# Patient Record
Sex: Female | Born: 1935 | Race: White | Hispanic: No | Marital: Married | Smoking: Former smoker
Health system: Southern US, Community
[De-identification: ages and names within clinical notes are randomized; demographics above are authoritative.]

## PROBLEM LIST (undated history)

## (undated) DIAGNOSIS — R519 Headache, unspecified: Secondary | ICD-10-CM

## (undated) DIAGNOSIS — K649 Unspecified hemorrhoids: Secondary | ICD-10-CM

## (undated) DIAGNOSIS — I10 Essential (primary) hypertension: Secondary | ICD-10-CM

## (undated) DIAGNOSIS — R918 Other nonspecific abnormal finding of lung field: Secondary | ICD-10-CM

## (undated) DIAGNOSIS — K5790 Diverticulosis of intestine, part unspecified, without perforation or abscess without bleeding: Secondary | ICD-10-CM

## (undated) DIAGNOSIS — E785 Hyperlipidemia, unspecified: Secondary | ICD-10-CM

## (undated) DIAGNOSIS — R51 Headache: Secondary | ICD-10-CM

## (undated) DIAGNOSIS — C439 Malignant melanoma of skin, unspecified: Secondary | ICD-10-CM

## (undated) DIAGNOSIS — Z972 Presence of dental prosthetic device (complete) (partial): Secondary | ICD-10-CM

## (undated) DIAGNOSIS — D126 Benign neoplasm of colon, unspecified: Secondary | ICD-10-CM

## (undated) DIAGNOSIS — E039 Hypothyroidism, unspecified: Secondary | ICD-10-CM

## (undated) HISTORY — DX: Hyperlipidemia, unspecified: E78.5

## (undated) HISTORY — DX: Essential (primary) hypertension: I10

## (undated) HISTORY — DX: Malignant melanoma of skin, unspecified: C43.9

## (undated) HISTORY — PX: VAGINAL HYSTERECTOMY: SHX2639

## (undated) HISTORY — PX: OTHER SURGICAL HISTORY: SHX169

## (undated) HISTORY — DX: Unspecified hemorrhoids: K64.9

## (undated) HISTORY — PX: MULTIPLE TOOTH EXTRACTIONS: SHX2053

## (undated) HISTORY — PX: KNEE SURGERY: SHX244

## (undated) HISTORY — DX: Hypothyroidism, unspecified: E03.9

## (undated) HISTORY — DX: Diverticulosis of intestine, part unspecified, without perforation or abscess without bleeding: K57.90

## (undated) HISTORY — PX: SKIN CANCER EXCISION: SHX779

## (undated) HISTORY — DX: Benign neoplasm of colon, unspecified: D12.6

---
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
## 1991-01-29 DIAGNOSIS — E785 Hyperlipidemia, unspecified: Secondary | ICD-10-CM

## 1991-01-29 HISTORY — DX: Hyperlipidemia, unspecified: E78.5

## 1995-10-29 DIAGNOSIS — I10 Essential (primary) hypertension: Secondary | ICD-10-CM

## 1995-10-29 DIAGNOSIS — E039 Hypothyroidism, unspecified: Secondary | ICD-10-CM

## 1995-10-29 HISTORY — DX: Hypothyroidism, unspecified: E03.9

## 1995-10-29 HISTORY — DX: Essential (primary) hypertension: I10

## 1998-08-29 ENCOUNTER — Encounter: Payer: Self-pay | Admitting: Family Medicine

## 1998-08-29 LAB — CONVERTED CEMR LAB: Pap Smear: NORMAL

## 1998-09-09 ENCOUNTER — Other Ambulatory Visit: Admission: RE | Admit: 1998-09-09 | Discharge: 1998-09-09 | Payer: Self-pay | Admitting: Family Medicine

## 1999-09-30 ENCOUNTER — Encounter: Admission: RE | Admit: 1999-09-30 | Discharge: 1999-09-30 | Payer: Self-pay | Admitting: Internal Medicine

## 1999-09-30 ENCOUNTER — Encounter: Payer: Self-pay | Admitting: Internal Medicine

## 1999-10-07 ENCOUNTER — Encounter: Admission: RE | Admit: 1999-10-07 | Discharge: 1999-10-07 | Payer: Self-pay | Admitting: Internal Medicine

## 1999-10-07 ENCOUNTER — Encounter: Payer: Self-pay | Admitting: Internal Medicine

## 2000-03-30 LAB — HM DEXA SCAN: HM Dexa Scan: NORMAL

## 2000-10-01 ENCOUNTER — Encounter: Payer: Self-pay | Admitting: Internal Medicine

## 2000-10-01 ENCOUNTER — Encounter: Admission: RE | Admit: 2000-10-01 | Discharge: 2000-10-01 | Payer: Self-pay | Admitting: Internal Medicine

## 2000-10-20 ENCOUNTER — Encounter: Payer: Self-pay | Admitting: Family Medicine

## 2001-03-04 ENCOUNTER — Encounter: Payer: Self-pay | Admitting: Family Medicine

## 2001-03-04 ENCOUNTER — Encounter: Payer: Self-pay | Admitting: Surgery

## 2001-03-04 ENCOUNTER — Ambulatory Visit: Admission: RE | Admit: 2001-03-04 | Discharge: 2001-03-04 | Payer: Self-pay | Admitting: Surgery

## 2001-03-07 ENCOUNTER — Encounter: Payer: Self-pay | Admitting: Family Medicine

## 2001-04-25 ENCOUNTER — Observation Stay (HOSPITAL_COMMUNITY): Admission: RE | Admit: 2001-04-25 | Discharge: 2001-04-26 | Payer: Self-pay | Admitting: Surgery

## 2001-04-25 HISTORY — PX: HERNIA REPAIR: SHX51

## 2001-10-12 ENCOUNTER — Encounter: Admission: RE | Admit: 2001-10-12 | Discharge: 2001-10-12 | Payer: Self-pay | Admitting: Internal Medicine

## 2001-10-12 ENCOUNTER — Encounter: Payer: Self-pay | Admitting: Internal Medicine

## 2002-01-04 ENCOUNTER — Ambulatory Visit (HOSPITAL_BASED_OUTPATIENT_CLINIC_OR_DEPARTMENT_OTHER): Admission: RE | Admit: 2002-01-04 | Discharge: 2002-01-04 | Payer: Self-pay | Admitting: Surgery

## 2002-02-27 LAB — FECAL OCCULT BLOOD, GUAIAC: Fecal Occult Blood: NEGATIVE

## 2002-10-31 ENCOUNTER — Encounter: Admission: RE | Admit: 2002-10-31 | Discharge: 2002-10-31 | Payer: Self-pay | Admitting: Internal Medicine

## 2002-10-31 ENCOUNTER — Encounter: Payer: Self-pay | Admitting: Internal Medicine

## 2003-11-01 ENCOUNTER — Encounter: Admission: RE | Admit: 2003-11-01 | Discharge: 2003-11-01 | Payer: Self-pay | Admitting: Internal Medicine

## 2004-03-25 ENCOUNTER — Ambulatory Visit: Payer: Self-pay | Admitting: Internal Medicine

## 2004-03-30 HISTORY — PX: SEPTOPLASTY: SUR1290

## 2004-10-08 ENCOUNTER — Ambulatory Visit: Payer: Self-pay | Admitting: Gastroenterology

## 2004-10-22 ENCOUNTER — Ambulatory Visit: Payer: Self-pay | Admitting: Gastroenterology

## 2004-11-03 ENCOUNTER — Encounter: Admission: RE | Admit: 2004-11-03 | Discharge: 2004-11-03 | Payer: Self-pay | Admitting: Internal Medicine

## 2004-11-10 ENCOUNTER — Ambulatory Visit: Payer: Self-pay | Admitting: Family Medicine

## 2004-12-10 ENCOUNTER — Ambulatory Visit: Payer: Self-pay | Admitting: Internal Medicine

## 2004-12-31 ENCOUNTER — Ambulatory Visit: Payer: Self-pay | Admitting: Internal Medicine

## 2005-01-13 ENCOUNTER — Ambulatory Visit: Payer: Self-pay | Admitting: Internal Medicine

## 2005-02-09 ENCOUNTER — Ambulatory Visit: Payer: Self-pay | Admitting: Internal Medicine

## 2005-06-03 ENCOUNTER — Ambulatory Visit: Payer: Self-pay | Admitting: Internal Medicine

## 2005-07-23 ENCOUNTER — Encounter: Admission: RE | Admit: 2005-07-23 | Discharge: 2005-07-23 | Payer: Self-pay | Admitting: Otolaryngology

## 2005-07-27 ENCOUNTER — Ambulatory Visit (HOSPITAL_BASED_OUTPATIENT_CLINIC_OR_DEPARTMENT_OTHER): Admission: RE | Admit: 2005-07-27 | Discharge: 2005-07-27 | Payer: Self-pay | Admitting: Otolaryngology

## 2005-07-27 ENCOUNTER — Encounter (INDEPENDENT_AMBULATORY_CARE_PROVIDER_SITE_OTHER): Payer: Self-pay | Admitting: *Deleted

## 2005-11-04 ENCOUNTER — Encounter: Admission: RE | Admit: 2005-11-04 | Discharge: 2005-11-04 | Payer: Self-pay | Admitting: Internal Medicine

## 2005-11-13 ENCOUNTER — Encounter: Admission: RE | Admit: 2005-11-13 | Discharge: 2005-11-13 | Payer: Self-pay | Admitting: Internal Medicine

## 2005-12-23 ENCOUNTER — Ambulatory Visit: Payer: Self-pay | Admitting: Internal Medicine

## 2005-12-25 ENCOUNTER — Ambulatory Visit: Payer: Self-pay | Admitting: Family Medicine

## 2005-12-29 ENCOUNTER — Ambulatory Visit: Payer: Self-pay | Admitting: Family Medicine

## 2006-12-08 ENCOUNTER — Encounter: Payer: Self-pay | Admitting: Family Medicine

## 2006-12-08 ENCOUNTER — Encounter: Admission: RE | Admit: 2006-12-08 | Discharge: 2006-12-08 | Payer: Self-pay | Admitting: Family Medicine

## 2006-12-13 ENCOUNTER — Encounter (INDEPENDENT_AMBULATORY_CARE_PROVIDER_SITE_OTHER): Payer: Self-pay | Admitting: *Deleted

## 2006-12-15 ENCOUNTER — Encounter: Payer: Self-pay | Admitting: Family Medicine

## 2006-12-15 DIAGNOSIS — E669 Obesity, unspecified: Secondary | ICD-10-CM | POA: Insufficient documentation

## 2007-01-12 ENCOUNTER — Ambulatory Visit: Payer: Self-pay | Admitting: Family Medicine

## 2007-01-12 LAB — CONVERTED CEMR LAB
ALT: 21 units/L (ref 0–35)
AST: 23 units/L (ref 0–37)
BUN: 13 mg/dL (ref 6–23)
CO2: 30 meq/L (ref 19–32)
Calcium: 9.2 mg/dL (ref 8.4–10.5)
Chloride: 110 meq/L (ref 96–112)
Cholesterol: 149 mg/dL (ref 0–200)
Creatinine, Ser: 0.9 mg/dL (ref 0.4–1.2)
Free T4: 0.9 ng/dL (ref 0.6–1.6)
GFR calc Af Amer: 79 mL/min
GFR calc non Af Amer: 65 mL/min
Glucose, Bld: 90 mg/dL (ref 70–99)
HDL: 41.5 mg/dL (ref >39.0)
LDL Cholesterol: 86 mg/dL (ref 0–99)
Potassium: 3.7 meq/L (ref 3.5–5.1)
Sodium: 145 meq/L (ref 135–145)
TSH: 2.86 microintl units/mL (ref 0.35–5.50)
Total CHOL/HDL Ratio: 3.6
Triglycerides: 106 mg/dL (ref 0–149)
VLDL: 21 mg/dL (ref 0–40)

## 2007-01-17 ENCOUNTER — Ambulatory Visit: Payer: Self-pay | Admitting: Family Medicine

## 2007-01-19 ENCOUNTER — Telehealth: Payer: Self-pay | Admitting: Internal Medicine

## 2007-01-28 ENCOUNTER — Ambulatory Visit: Payer: Self-pay | Admitting: Family Medicine

## 2007-04-15 ENCOUNTER — Telehealth (INDEPENDENT_AMBULATORY_CARE_PROVIDER_SITE_OTHER): Payer: Self-pay | Admitting: *Deleted

## 2007-05-24 ENCOUNTER — Ambulatory Visit: Payer: Self-pay | Admitting: Family Medicine

## 2007-12-09 ENCOUNTER — Encounter: Admission: RE | Admit: 2007-12-09 | Discharge: 2007-12-09 | Payer: Self-pay | Admitting: Family Medicine

## 2007-12-13 ENCOUNTER — Encounter (INDEPENDENT_AMBULATORY_CARE_PROVIDER_SITE_OTHER): Payer: Self-pay | Admitting: *Deleted

## 2007-12-26 ENCOUNTER — Ambulatory Visit: Payer: Self-pay | Admitting: Family Medicine

## 2008-03-26 ENCOUNTER — Ambulatory Visit: Payer: Self-pay | Admitting: Family Medicine

## 2008-03-26 LAB — CONVERTED CEMR LAB
ALT: 27 units/L (ref 0–35)
AST: 31 units/L (ref 0–37)
Albumin: 3.8 g/dL (ref 3.5–5.2)
Alkaline Phosphatase: 57 units/L (ref 39–117)
BUN: 19 mg/dL (ref 6–23)
Bilirubin, Direct: 0.1 mg/dL (ref 0.0–0.3)
CO2: 31 meq/L (ref 19–32)
Calcium: 9.2 mg/dL (ref 8.4–10.5)
Chloride: 109 meq/L (ref 96–112)
Cholesterol: 165 mg/dL (ref 0–200)
Creatinine, Ser: 1.1 mg/dL (ref 0.4–1.2)
Free T4: 0.7 ng/dL (ref 0.6–1.6)
GFR calc Af Amer: 63 mL/min
GFR calc non Af Amer: 52 mL/min
Glucose, Bld: 87 mg/dL (ref 70–99)
HDL: 45.3 mg/dL (ref >39.0)
LDL Cholesterol: 96 mg/dL (ref 0–99)
Potassium: 3.7 meq/L (ref 3.5–5.1)
Sodium: 146 meq/L — ABNORMAL HIGH (ref 135–145)
TSH: 2.36 microintl units/mL (ref 0.35–5.50)
Total Bilirubin: 0.7 mg/dL (ref 0.3–1.2)
Total CHOL/HDL Ratio: 3.6
Total Protein: 6.7 g/dL (ref 6.0–8.3)
Triglycerides: 121 mg/dL (ref 0–149)
VLDL: 24 mg/dL (ref 0–40)

## 2008-04-04 ENCOUNTER — Ambulatory Visit: Payer: Self-pay | Admitting: Family Medicine

## 2008-04-13 ENCOUNTER — Telehealth: Payer: Self-pay | Admitting: Family Medicine

## 2008-04-30 ENCOUNTER — Telehealth: Payer: Self-pay | Admitting: Family Medicine

## 2008-12-12 ENCOUNTER — Encounter: Admission: RE | Admit: 2008-12-12 | Discharge: 2008-12-12 | Payer: Self-pay | Admitting: Family Medicine

## 2009-01-02 ENCOUNTER — Ambulatory Visit: Payer: Self-pay | Admitting: Family Medicine

## 2009-05-08 ENCOUNTER — Ambulatory Visit: Payer: Self-pay | Admitting: Family Medicine

## 2009-05-08 LAB — CONVERTED CEMR LAB
ALT: 21 units/L (ref 0–35)
AST: 24 units/L (ref 0–37)
Albumin: 4 g/dL (ref 3.5–5.2)
Alkaline Phosphatase: 58 units/L (ref 39–117)
BUN: 17 mg/dL (ref 6–23)
Bilirubin, Direct: 0.2 mg/dL (ref 0.0–0.3)
CO2: 29 meq/L (ref 19–32)
Calcium: 9.3 mg/dL (ref 8.4–10.5)
Chloride: 109 meq/L (ref 96–112)
Cholesterol: 148 mg/dL (ref 0–200)
Creatinine, Ser: 0.9 mg/dL (ref 0.4–1.2)
Free T4: 0.8 ng/dL (ref 0.6–1.6)
GFR calc non Af Amer: 64.91 mL/min (ref >60)
Glucose, Bld: 84 mg/dL (ref 70–99)
HDL: 46.5 mg/dL (ref >39.00)
LDL Cholesterol: 77 mg/dL (ref 0–99)
Potassium: 3.8 meq/L (ref 3.5–5.1)
Sodium: 143 meq/L (ref 135–145)
TSH: 2.58 microintl units/mL (ref 0.35–5.50)
Total Bilirubin: 0.7 mg/dL (ref 0.3–1.2)
Total CHOL/HDL Ratio: 3
Total Protein: 6.8 g/dL (ref 6.0–8.3)
Triglycerides: 125 mg/dL (ref 0.0–149.0)
VLDL: 25 mg/dL (ref 0.0–40.0)

## 2009-05-14 ENCOUNTER — Ambulatory Visit: Payer: Self-pay | Admitting: Family Medicine

## 2009-10-15 ENCOUNTER — Encounter (INDEPENDENT_AMBULATORY_CARE_PROVIDER_SITE_OTHER): Payer: Self-pay | Admitting: *Deleted

## 2009-10-24 ENCOUNTER — Encounter: Payer: Self-pay | Admitting: Gastroenterology

## 2009-11-04 ENCOUNTER — Encounter (INDEPENDENT_AMBULATORY_CARE_PROVIDER_SITE_OTHER): Payer: Self-pay | Admitting: *Deleted

## 2009-11-06 ENCOUNTER — Encounter (INDEPENDENT_AMBULATORY_CARE_PROVIDER_SITE_OTHER): Payer: Self-pay | Admitting: *Deleted

## 2009-11-20 ENCOUNTER — Encounter (INDEPENDENT_AMBULATORY_CARE_PROVIDER_SITE_OTHER): Payer: Self-pay | Admitting: *Deleted

## 2009-11-25 ENCOUNTER — Ambulatory Visit: Payer: Self-pay | Admitting: Gastroenterology

## 2009-12-09 ENCOUNTER — Ambulatory Visit: Payer: Self-pay | Admitting: Gastroenterology

## 2009-12-09 LAB — HM COLONOSCOPY

## 2009-12-11 ENCOUNTER — Encounter: Payer: Self-pay | Admitting: Gastroenterology

## 2009-12-18 ENCOUNTER — Encounter: Admission: RE | Admit: 2009-12-18 | Discharge: 2009-12-18 | Payer: Self-pay | Admitting: Family Medicine

## 2009-12-18 LAB — HM MAMMOGRAPHY: HM Mammogram: NEGATIVE

## 2009-12-19 ENCOUNTER — Ambulatory Visit: Payer: Self-pay | Admitting: Family Medicine

## 2010-04-10 ENCOUNTER — Ambulatory Visit
Admission: RE | Admit: 2010-04-10 | Discharge: 2010-04-10 | Payer: Self-pay | Source: Home / Self Care | Attending: Family Medicine | Admitting: Family Medicine

## 2010-04-17 ENCOUNTER — Telehealth: Payer: Self-pay | Admitting: Family Medicine

## 2010-04-29 NOTE — Miscellaneous (Signed)
Summary: gi medication  Clinical Lists Changes  Medications: Added new medication of ANALPRAM-HC SINGLES 1-1 %  CREA (HYDROCORTISONE ACE-PRAMOXINE) Apply to rectum 2 times as needed - Signed Rx of ANALPRAM-HC SINGLES 1-1 %  CREA (HYDROCORTISONE ACE-PRAMOXINE) Apply to rectum 2 times as needed;  #1 box x prn;  Signed;  Entered by: Trilby Leaver RN;  Authorized by: Sable Feil MD [facility];  Method [facility]: [facility]  [place], [place], [place]  [address], Ph: [phone] or [phone], Fax: [phone] Observations: Added new observation of ALLERGY REV: Done (12/09/2009 11:28) Added new observation of NKA: T (12/09/2009 11:28)    Prescriptions: ANALPRAM-HC SINGLES 1-1 %  CREA (HYDROCORTISONE ACE-PRAMOXINE) Apply to rectum 2 times as needed  #1 box x prn   Entered by:   Trilby Leaver RN   Authorized by:   Sable Feil MD [facility]   Signed by:   Trilby Leaver RN on 12/09/2009   Method used:   [facility]        [facility]  [place] (retail)       [address]       [place], [place]  [address]       Ph: [phone] or [phone]       Fax: [phone]   RxID:   [id number]

## 2010-04-29 NOTE — Assessment & Plan Note (Signed)
Summary: cpx/rbh   Vital Signs:  Patient profile:   77 year old female Height:      67 inches Weight:      216 pounds BMI:     33.95 Temp:     98.1 degrees F oral Pulse rate:   64 / minute Pulse rhythm:   regular BP sitting:   124 / 84  (left arm) Cuff size:   large  Vitals Entered By: Emelia Salisbury LPN (February 15, 624THL 10:55 AM) CC: 30 Minute checkup, has had a hysterectomy, had a colonoscopy in 2001 and thinks that she is due one this year, Preventive Care   History of Present Illness: Pt here for Comp Exam. She had mammo in Sep.  She has swelling of the legs, does eat salt and was discussed. She has taken Lasix for 20 years.  Preventive Screening-Counseling & Management  Alcohol-Tobacco     Alcohol drinks/day: 0     Smoking Status: quit     Year Quit: 1995     Pack years: 25     Passive Smoke Exposure: no  Caffeine-Diet-Exercise     Caffeine use/day: 0     Does Patient Exercise: yes     Type of exercise: swims once, aerobics once, square dance once     Times/week: 3  Problems Prior to Update: 1)  Postmenopausal Status, Iatrogenic  (ICD-627.2) 2)  Obesity  (ICD-278.00) 3)  Hyperlipidemia  (ICD-272.4) 4)  Hypertension  (ICD-401.9) 5)  Hypothyroidism  (ICD-244.9)  Medications Prior to Update: 1)  Levothyroxine Sodium 50 Mcg  Tabs (Levothyroxine Sodium) .... Take 1 Tablet By Mouth Once A Day 2)  Calcium 600/vitamin D 600-200 Mg-Unit  Tabs (Calcium Carbonate-Vitamin D) .... Take 1 Tablet By Mouth Two Times A Day 3)  Caduet 5-20 Mg  Tabs (Amlodipine-Atorvastatin) .... Take 1 Tablet By Mouth Once A Day 4)  Furosemide 20 Mg  Tabs (Furosemide) .... Take 1 Tablet By Mouth Once A Day 5)  Flonase 50 Mcg/act  Susp (Fluticasone Propionate) .... As Needed  Allergies: No Known Drug Allergies  Past History:  Past Medical History: Last updated: 12/15/2006 Hypothyroidism (10/29/1995) Hypertension (10/29/1995) Hyperlipidemia (01/29/1991) Dexa nml 2002  Past Surgical  History: Last updated: 01/17/2007 Tonsillectomy 1954 total hysterectomy fibroids 1984 colonoscopy 06/07/95 dexa nml 2002 hernia repair 04/25/01 Septoplasty and sinus surg  (Dr Ernesto Rutherford) 2006 Catarract repair bilat [id number]  Family History: Last updated: 01/17/2007 Father dec 70 Lungs (smoker) Mother dec 93 Stroke CHF Brother dec 62 Metastatic Ca  Sister dec 72 Stomach Ca Sister dec 78 ruptured Gallbladder  Social History: Last updated: 04/04/2008 Occupation:Admin Asst,[employer] Retired Married lives w/ husb  2children  Risk Factors: Alcohol Use: 0 (05/14/2009) Caffeine Use: 0 (05/14/2009) Exercise: yes (05/14/2009)  Risk Factors: Smoking Status: quit (05/14/2009) Passive Smoke Exposure: no (05/14/2009)  Review of Systems General:  Denies chills, fatigue, fever, sweats, weakness, and weight loss. Eyes:  Denies blurring, discharge, and eye pain. ENT:  Denies decreased hearing, ear discharge, earache, and ringing in ears. CV:  Complains of lightheadness and swelling of feet; denies chest pain or discomfort, fainting, fatigue, palpitations, and shortness of breath with exertion; normal swelling regularly. Resp:  Denies cough, shortness of breath, and wheezing. GI:  Complains of constipation; denies abdominal pain, bloody stools, change in bowel habits, dark tarry stools, diarrhea, indigestion, loss of appetite, nausea, vomiting, vomiting blood, and yellowish skin color; uses Citrucel. GU:  Complains of nocturia; denies discharge, dysuria, and urinary frequency. MS:  Complains of cramps;  denies joint pain, low back pain, muscle aches, muscle weakness, and stiffness; occas at night.. Derm:  Denies dryness, itching, and rash. Neuro:  Denies numbness, poor balance, tingling, and tremors.  Physical Exam  General:  Well-developed,well-nourished,in no acute distress; alert,appropriate and cooperative throughout examination Head:  Normocephalic and atraumatic without obvious  abnormalities. No apparent alopecia or balding. Eyes:  Conjunctiva clear bilaterally.  Ears:  External ear exam shows no significant lesions or deformities.  Otoscopic examination reveals clear canals, tympanic membranes are intact bilaterally without bulging, retraction, inflammation or discharge. Hearing is grossly normal bilaterally. Nose:  External nasal examination shows no deformity or inflammation. Nasal mucosa are pink and moist without lesions or exudates. Mouth:  Oral mucosa and oropharynx without lesions or exudates.  Teeth in good repair. Neck:  No deformities, masses, or tenderness noted. Chest Wall:  No deformities, masses, or tenderness noted. Breasts:  No mass, nodules, thickening, tenderness, bulging, retraction, inflamation, nipple discharge or skin changes noted.   Lungs:  Normal respiratory effort, chest expands symmetrically. Lungs are clear to auscultation, no crackles or wheezes. Heart:  Normal rate and regular rhythm. S1 and S2 normal without gallop, murmur, click, rub or other extra sounds. Abdomen:  Bowel sounds positive,abdomen soft and non-tender without masses, organomegaly or hernias noted. Rectal:  No external abnormalities noted. Normal sphincter tone. No rectal masses or tenderness. G neg. Genitalia:  Bimanual only done Introitus wnl, Uterus and Cervix absent, Adnexa nontender w/o mass, ovaries not felt.  Msk:  No deformity or scoliosis noted of thoracic or lumbar spine.   Pulses:  R and L carotid,radial,femoral,dorsalis pedis and posterior tibial pulses are full and equal bilaterally Extremities:  No clubbing, cyanosis, edema, or deformity noted with normal full range of motion of all joints.   Neurologic:  No cranial nerve deficits noted. Station and gait are normal. Plantar reflexes are down-going bilaterally. DTRs are symmetrical throughout. Sensory, motor and coordinative functions appear intact. Skin:  Intact without suspicious lesions or rashes except mild  linear papular rash on the left shoulder. Cervical Nodes:  No lymphadenopathy noted Axillary Nodes:  No palpable lymphadenopathy Inguinal Nodes:  No significant adenopathy Psych:  Cognition and judgment appear intact. Alert and cooperative with normal attention span and concentration. No apparent delusions, illusions, hallucinations   Impression & Recommendations:  Problem # 1:  POSTMENOPAUSAL STATUS, IATROGENIC (ICD-627.2) Assessment Unchanged Stable.  Problem # 2:  HYPERLIPIDEMIA (B2193296.4) Assessment: Unchanged Good nos on 20 of Lipitor. Cont. Watch fatty intake. Her updated medication list for this problem includes:    Caduet 5-20 Mg Tabs (Amlodipine-atorvastatin) .Marland Kitchen... Take 1 tablet by mouth once a day  Labs Reviewed: SGOT: 24 (05/08/2009)   SGPT: 21 (05/08/2009)   HDL:46.50 (05/08/2009), 45.3 (03/26/2008)  LDL:77 (05/08/2009), 96 (03/26/2008)  Chol:148 (05/08/2009), 165 (03/26/2008)  Trig:125.0 (05/08/2009), 121 (03/26/2008)  Problem # 3:  HYPERTENSION (ICD-401.9) Assessment: Unchanged Stable. Her updated medication list for this problem includes:    Caduet 5-20 Mg Tabs (Amlodipine-atorvastatin) .Marland Kitchen... Take 1 tablet by mouth once a day    Furosemide 20 Mg Tabs (Furosemide) .Marland Kitchen... Take 1 tablet by mouth once a day  BP today: 124/84 Prior BP: 130/80 (04/04/2008)  Labs Reviewed: K+: 3.8 (05/08/2009) Creat: : 0.9 (05/08/2009)   Chol: 148 (05/08/2009)   HDL: 46.50 (05/08/2009)   LDL: 77 (05/08/2009)   TG: 125.0 (05/08/2009)  Problem # 4:  HYPOTHYROIDISM (ICD-244.9) Euthyroid on current dose. Her updated medication list for this problem includes:    Levothyroxine Sodium 50 Mcg Tabs (  Levothyroxine sodium) .Marland Kitchen... Take 1 tablet by mouth once a day  Labs Reviewed: TSH: 2.58 (05/08/2009)    Chol: 148 (05/08/2009)   HDL: 46.50 (05/08/2009)   LDL: 77 (05/08/2009)   TG: 125.0 (05/08/2009)  Complete Medication List: 1)  Levothyroxine Sodium 50 Mcg Tabs (Levothyroxine sodium) ....  Take 1 tablet by mouth once a day 2)  Calcium 600/vitamin D 600-200 Mg-unit Tabs (Calcium carbonate-vitamin d) .... Take 1 tablet by mouth two times a day 3)  Caduet 5-20 Mg Tabs (Amlodipine-atorvastatin) .... Take 1 tablet by mouth once a day 4)  Furosemide 20 Mg Tabs (Furosemide) .... Take 1 tablet by mouth once a day 5)  Flonase 50 Mcg/act Susp (Fluticasone propionate) .... As needed  Other Orders: TD Toxoids IM 7 YR + [phone]) Admin 1st Vaccine [phone]) Admin 1st Vaccine [phone]) [phone])  PAP Screening:    Last PAP smear:  08/29/1998  Mammogram Screening:    Last Mammogram:  12/12/2008  Osteoporosis Risk Assessment:  Risk Factors for Fracture or Low Bone Density:   Race (White or Asian):     yes   Smoking status:       quit   Thyroid replacement:     yes   Thyroid disease:     yes  Immunization & Chemoprophylaxis:    Tetanus vaccine: Td  (05/14/2009)    Influenza vaccine: Fluvax 3+  (01/02/2009)    Pneumovax: Pneumovax  (08/12/1999)  Patient Instructions: 1)  RTC yearly or as needed.  Current Allergies (reviewed today): No known allergies    Pneumovax Immunization History:    Pneumovax # 1:  Pneumovax (08/12/1999)  Tetanus/Td Vaccine    Vaccine Type: Td    Site: left deltoid    Mfr: [place]    Dose: 0.5 ml    Route: IM    Given by: Emelia Salisbury LPN    Exp. Date: 02/12/2011    Lot #: [id number]    VIS given: 02/15/07 version given May 14, 2009.

## 2010-04-29 NOTE — Letter (Signed)
Summary: Previsit letter  [facility]  [place], [place] [address]   Phone: [phone]  Fax: [phone]       10/24/2009 MRN: [id number]  Lisa [name] [address] [place], [place]  [address]  Dear Lisa [name],  Welcome to the Gastroenterology Division at [facility].    You are scheduled to see a nurse for your pre-procedure visit on 11-25-09 at 1:30pm on the 3rd floor at [facility] [place] [employer].  We ask that you try to arrive at our office 15 minutes prior to your appointment time to allow for check-in.  Your nurse visit will consist of discussing your medical and surgical history, your immediate family medical history, and your medications.    Please bring a complete list of all your medications or, if you prefer, bring the medication bottles and we will list them.  We will need to be aware of both prescribed and over the counter drugs.  We will need to know exact dosage information as well.  If you are on blood thinners (Coumadin, Plavix, Aggrenox, Ticlid, etc.) please call our office today/prior to your appointment, as we need to consult with your physician about holding your medication.   Please be prepared to read and sign documents such as consent forms, a financial agreement, and acknowledgement forms.  If necessary, and with your consent, a friend or relative is welcome to sit-in on the nurse visit with you.  Please bring your insurance card so that we may make a copy of it.  If your insurance requires a referral to see a specialist, please bring your referral form from your primary care physician.  No co-pay is required for this nurse visit.     If you cannot keep your appointment, please call [phone] to cancel or reschedule prior to your appointment date.  This allows [place] the opportunity to schedule an appointment for another patient in need of care.    Thank you for choosing [facility] for your medical needs.  We  appreciate the opportunity to care for you.  Please visit [place] at our website  to learn more about our practice.                     Sincerely.                                                                                                                   The Gastroenterology Division

## 2010-04-29 NOTE — Assessment & Plan Note (Signed)
Summary: flu shot/bir   Nurse Visit    Prior Medications: LEVOTHYROXINE SODIUM 50 MCG  TABS (LEVOTHYROXINE SODIUM) Take 1 tablet by mouth once a day CALCIUM 600/VITAMIN D 600-200 MG-UNIT  TABS (CALCIUM CARBONATE-VITAMIN D) Take 1 tablet by mouth two times a day CADUET 5-20 MG  TABS (AMLODIPINE-ATORVASTATIN) Take 1 tablet by mouth once a day FUROSEMIDE 20 MG  TABS (FUROSEMIDE) Take 1 tablet by mouth once a day FLONASE 50 MCG/ACT  SUSP (FLUTICASONE PROPIONATE) as needed Current Allergies: No known allergies     Orders Added: 1)  Admin 1st Vaccine [90471] 2)  Flu Vaccine 31yrs + [id number]   Flu Vaccine Consent Questions     Do you have a history of severe allergic reactions to this vaccine? no    Any prior history of allergic reactions to egg and/or gelatin? no    Do you have a sensitivity to the preservative Thimersol? no    Do you have a past history of Guillan-Barre Syndrome? no    Do you currently have an acute febrile illness? no    Have you ever had a severe reaction to latex? no    Vaccine information given and explained to patient? yes    Are you currently pregnant? no    Lot Number:[id number]   Site Given  Left Deltoid IM].opcflu

## 2010-04-29 NOTE — Miscellaneous (Signed)
Summary: [facility]  Clinical Lists Changes  Medications: Added new medication of MOVIPREP 100 GM  SOLR (PEG-KCL-NACL-NASULF-NA ASC-C) As per prep instructions. - Signed Rx of MOVIPREP 100 GM  SOLR (PEG-KCL-NACL-NASULF-NA ASC-C) As per prep instructions.;  #1 x 0;  Signed;  Entered by: Everlean Patterson RN;  Authorized by: Sable Feil MD [facility];  Method used: [facility]  [place], [place], [place]  [address], Ph: [phone] or [phone], Fax: [phone] Observations: Added new observation of NKA: T (11/25/2009 13:27)    Prescriptions: MOVIPREP 100 GM  SOLR (PEG-KCL-NACL-NASULF-NA ASC-C) As per prep instructions.  #1 x 0   Entered by:   Everlean Patterson RN   Authorized by:   Sable Feil MD [facility]   Signed by:   Everlean Patterson RN on 11/25/2009   Method used:   [facility]ronically to        [facility]  [place]* (retail)       [address]       [place], [place]  [address]       Ph: [phone] or [phone]       Fax: [phone]   RxID:   [id number]

## 2010-04-29 NOTE — Letter (Signed)
Summary: Results Follow up Letter  [place] at [facility]  [address] [place], [place] [address]   Phone: [phone]  Fax: [phone]    12/13/2007 MRN: [id number]  [place] [address] [place], [place]  [address]  Dear Ms. Mantei,  The following are the results of your recent test(s):  Test         Result    Pap Smear:        Normal _____  Not Normal _____ Comments: ______________________________________________________ Cholesterol: LDL(Bad cholesterol):         Your goal is less than:         HDL (Good cholesterol):       Your goal is more than: Comments:  ______________________________________________________ Mammogram:        Normal _x____  Not Normal _____ Comments:    YOUR MAMMOGRAM REPORT WAS NORMAL. PLEASE MAKE SURE TO REPEAT IN ONE YEAR. ENCLOSED IS A COPY OF YOUR REPORT.  ___________________________________________________________________ Hemoccult:        Normal _____  Not normal _______ Comments:    _____________________________________________________________________ Other Tests:    We routinely do not discuss normal results over the telephone.  If you desire a copy of the results, or you have any questions about this information we can discuss them at your next office visit.   Sincerely,

## 2010-04-29 NOTE — Letter (Signed)
Summary: Results Follow up Letter  [place] at [facility]  [address] [place], [place] [address]   Phone: [phone]  Fax: [phone]    12/19/2009 MRN: [id number]    [place] [address] [place], [place]  [address]    Dear Ms. Turnipseed,  The following are the results of your recent test(s):  Test         Result    Pap Smear:        Normal _____  Not Normal _____ Comments: ______________________________________________________ Cholesterol: LDL(Bad cholesterol):         Your goal is less than:         HDL (Good cholesterol):       Your goal is more than: Comments:  ______________________________________________________ Mammogram:        Normal __X___  Not Normal _____ Comments:  Yearly follow up is recommended.   ___________________________________________________________________ Hemoccult:        Normal _____  Not normal _______ Comments:    _____________________________________________________________________ Other Tests:    We routinely do not discuss normal results over the telephone.  If you desire a copy of the results, or you have any questions about this information we can discuss them at your next office visit.   Sincerely,    Elveria Rising. Damita Dunnings, M.D.  [facility]

## 2010-04-29 NOTE — Progress Notes (Signed)
Summary: 90x3 rx's  Phone Note Refill Request Call back at Home Phone [phone] Message from:  Patient on April 15, 2007 11:30 AM  Refills Requested: Medication #1:  CADUET 5-20 MG  TABS Take 1 tablet by mouth once a day Initial call taken by: Daralene Milch,  April 15, 2007 11:30 AM  Follow-up for Phone Call        Rx left at front desk for patient to pick up. .............................................................Marland KitchenRonny Bacon Chavers April 18, 2007 8:16 AM Wife noified  ..........................................................Marland KitchenNatasha Chavers April 18, 2007 8:16 AM       Prescriptions: CADUET 5-20 MG  TABS (AMLODIPINE-ATORVASTATIN) Take 1 tablet by mouth once a day  #90 x 3   Entered and Authorized by:   Raenette Rover MD   Signed by:   Raenette Rover MD on 04/18/2007   Method used:   Print then Give to Patient   RxID:   [id number]

## 2010-04-29 NOTE — Procedures (Signed)
Summary: Colonoscopy  Patient: Lisa [name] Note: All result statuses are Final unless otherwise noted.  Tests: (1) Colonoscopy (COL)   COL Colonoscopy           [facility] [employer]     [place], [place]  [address]           COLONOSCOPY PROCEDURE REPORT           PATIENT:  Lisa [name], Lisa [name]  MR#:  [id number]     BIRTHDATE:  October 02, 1932, 8 yrs. old  GENDER:  female     ENDOSCOPIST:  Loralee Pacas. Sharlett Iles, MD, [facility]     REF. BY:     PROCEDURE DATE:  12/09/2009     PROCEDURE:  Colonoscopy with snare polypectomy     ASA CLASS:  Class II     INDICATIONS:  history of pre-cancerous (adenomatous) colon polyps           MEDICATIONS:   Fentanyl 50 mcg IV, Versed 5 mg IV           DESCRIPTION OF PROCEDURE:   After the risks benefits and     alternatives of the procedure were thoroughly explained, informed     consent was obtained.  Digital rectal exam was performed and     revealed prolasped hemorrhoids.   The [id number] endoscope was     introduced through the anus and advanced to the cecum, which was     identified by both the appendix and ileocecal valve, without     limitations.  The quality of the prep was excellent, using     MoviPrep.  The instrument was then slowly withdrawn as the colon     was fully examined.     <<PROCEDUREIMAGES>>           FINDINGS:  Moderate diverticulosis was found in the sigmoid to     descending colon segments.  There were multiple polyps identified     and removed. in the cecum. FLAT 2-3MM POLYPS COLD SNARE EXCISED.     Internal and external hemorrhoids were found. SEE PICTURES     Retroflexed views in the rectum revealed internal and external     hemorrhoids.    The scope was then withdrawn from the patient and     the procedure completed.           COMPLICATIONS:  None     ENDOSCOPIC IMPRESSION:     1) Moderate diverticulosis in the sigmoid to descending colon     segments     2) Polyps, multiple in the cecum     3)  Internal and external hemorrhoids     4) Internal and external hemorrhoids     R/O ADENOMAS     RECOMMENDATIONS:     1) high fiber diet     2) Follow up colonoscopy in 5 years     REPEAT EXAM:  No           ______________________________     Loralee Pacas. Sharlett Iles, MD, [facility]           CC:  Modesto Charon, MD           n.     Lorrin Mais:   Loralee Pacas. Patterson at 12/09/2009 11:15 AM           [place], [id number]  Note: An exclamation mark (!) indicates a result that was not dispersed into the flowsheet.  Document Creation Date: 12/09/2009 11:16 AM _______________________________________________________________________  (1) Order result status: Final Collection or observation date-time: 12/09/2009 11:04 Requested date-time:  Receipt date-time:  Reported date-time:  Referring Physician:   Ordering Physician: Verl Blalock [phone]) Specimen Source:  Source: Tawanna Cooler Order Number: [id number] Lab site:   Appended Document: Colonoscopy     Procedures Next Due Date:    Colonoscopy: 11/2014

## 2010-04-29 NOTE — Letter (Signed)
Summary: Results Follow up Letter   at [facility]  [address] [place], [place] [address]   Phone: [phone]  Fax: [phone]    12/13/2006 MRN: [id number]   [place], [place]  [address]  Dear Ms. Sliwinski,  The following are the results of your recent test(s):  Test         Result    Pap Smear:        Normal _____  Not Normal _____ Comments: ______________________________________________________ Cholesterol: LDL(Bad cholesterol):         Your goal is less than:         HDL (Good cholesterol):       Your goal is more than: Comments:  ______________________________________________________ Mammogram:        Normal _X____  Not Normal _____ Comments: YOUR MAMMOGRAM REPORT WAS NORMAL. PLEASE MAKE SURE TO REPEAT IN ONE YEAR.  ___________________________________________________________________ Hemoccult:        Normal _____  Not normal _______ Comments:    _____________________________________________________________________ Other Tests:    We routinely do not discuss normal results over the telephone.  If you desire a copy of the results, or you have any questions about this information we can discuss them at your next office visit.   Sincerely,

## 2010-04-29 NOTE — Letter (Signed)
Summary: Lisa [name] letter  [place] at [facility]  [address] [place], [place] [address]   Phone: [phone]  Fax: [phone]       11/04/2009 MRN: [id number]  [place] [address] [place], [place]  [address]  Dear Lisa [name],  [name], and [name] announce the retirement of Lisa [name], M.D., from full-time practice at the [facility] office effective September 26, 2009 and his plans of returning part-time.  It is important to Lisa [name] and to our practice that you understand that [name] has seven physicians in our office for your health care needs.  We will continue to offer the same exceptional care that you have today.    Lisa [name] has spoken to many of you about his plans for retirement and returning part-time in the fall.   We will continue to work with you through the transition to schedule appointments for you in the office and meet the high standards that [name] is committed to.   Again, it is with great pleasure that we share the news that Lisa [name] will return to [facility] at [facility] in October of 2011 with a reduced schedule.    If you have any questions, or would like to request an appointment with one of our physicians, please call us at [phone] and press the option for Scheduling an appointment.  We take pleasure in providing you with excellent patient care and look forward to seeing you at your next office visit.  [name] are:  Lisa [name], M.D. Lisa [name], M.D. Lisa [name], M.D. Lisa [name], M.D. Lisa [name], M.D. Lisa [name], M.D. We proudly welcomed Lisa [name], M.D. and Lisa [name], M.D. to the practice in July/August 2011.  Sincerely,  [name]

## 2010-04-29 NOTE — Assessment & Plan Note (Signed)
Summary: FLU SHOT/CLE   Nurse Visit   Allergies: No Known Drug Allergies  Immunizations Administered:  Influenza Vaccine # 1:    Vaccine Type: Fluvax 3+    Site: left deltoid    Mfr: GlaxoSmithKline    Dose: 0.5 ml    Route: IM    Given by: Edwin Dada CMA ([place])    Exp. Date: 09/27/2010    Lot #: [id number]    VIS given: 10/22/09 version given December 19, 2009.  Flu Vaccine Consent Questions:    Do you have a history of severe allergic reactions to this vaccine? no    Any prior history of allergic reactions to egg and/or gelatin? no    Do you have a sensitivity to the preservative Thimersol? no    Do you have a past history of Guillan-Barre Syndrome? no    Do you currently have an acute febrile illness? no    Have you ever had a severe reaction to latex? no    Vaccine information given and explained to patient? yes    Are you currently pregnant? no  Orders Added: 1)  Flu Vaccine 41yrs + [90658] 2)  Admin 1st Vaccine [id number]

## 2010-04-29 NOTE — Letter (Signed)
Summary: Patient Notice- Polyp Results  [facility]  [address] [place], [place] [address]   Phone: [phone]  Fax: [phone]        December 11, 2009 MRN: [id number]    [facility] [address] [place], [place]  [address]    Dear Ms. Pilch,  I am pleased to inform you that the colon polyp(s) removed during your recent colonoscopy was (were) found to be benign (no cancer detected) upon pathologic examination.  I recommend you have a repeat colonoscopy examination in 5_ years to look for recurrent polyps, as having colon polyps increases your risk for having recurrent polyps or even colon cancer in the future.  Should you develop new or worsening symptoms of abdominal pain, bowel habit changes or bleeding from the rectum or bowels, please schedule an evaluation with either your primary care physician or with me.  Additional information/recommendations:  x__ No further action with gastroenterology is needed at this time. Please      follow-up with your primary care physician for your other healthcare      needs.  __ Please call [phone] to schedule a return visit to review your      situation.  __ Please keep your follow-up visit as already scheduled.  __ Continue treatment plan as outlined the day of your exam.  Please call us if you are having persistent problems or have questions about your condition that have not been fully answered at this time.  Sincerely,  Sable Feil MD [facility]  This letter has been electronically signed by your physician.  Appended Document: Patient Notice- Polyp Results letter mailed

## 2010-04-29 NOTE — Letter (Signed)
Summary: Colonoscopy Letter  [facility]  [place], [place] [address]   Phone: [phone]  Fax: [phone]      October 15, 2009 MRN: [id number]   [facility] [address] [place], [place]  [address]   Dear Ms. Napier,   According to your medical record, it is time for you to schedule a Colonoscopy. The American Cancer Society recommends this procedure as a method to detect early colon cancer. Patients with a family history of colon cancer, or a personal history of colon polyps or inflammatory bowel disease are at increased risk.  This letter has beeen generated based on the recommendations made at the time of your procedure. If you feel that in your particular situation this may no longer apply, please contact our office.  Please call our office at [phone] to schedule this appointment or to update your records at your earliest convenience.  Thank you for cooperating with [place] to provide you with the very best care possible.   Sincerely,    Loralee Pacas. Sharlett Iles, M.D.  [facility] Gastroenterology Division [phone]

## 2010-04-29 NOTE — Letter (Signed)
Summary: Moviprep Instructions  [facility]  [address]   [place], [place] [address]   Phone: [phone]  Fax: [phone]       Lisa [name]    09-25-1932    MRN: [id number]        Procedure Day Sudie Grumbling: Monday, 12-09-09     Arrival Time: 9:30 a.m.     Procedure Time: 10:30 a.m.     Location of Procedure:                    x   [facility] (4th Floor)   [facility]   Starting 5 days prior to your procedure 12-04-09  do not eat nuts, seeds, popcorn, corn, beans, peas,  salads, or any raw vegetables.  Do not take any fiber supplements (e.g. Metamucil, Citrucel, and Benefiber).  THE DAY BEFORE YOUR PROCEDURE         DATE: 12-08-09    DAY: Sunday  1.  Drink clear liquids the entire day-NO SOLID FOOD  2.  Do not drink anything colored red or purple.  Avoid juices with pulp.  No orange juice.  3.  Drink at least 64 oz. (8 glasses) of fluid/clear liquids during the day to prevent dehydration and help the prep work efficiently.  CLEAR LIQUIDS INCLUDE: Water Jello Ice Popsicles Tea (sugar ok, no milk/cream) Powdered fruit flavored drinks Coffee (sugar ok, no milk/cream) Gatorade Juice: apple, white grape, white cranberry  Lemonade Clear bullion, consomm, broth Carbonated beverages (any kind) Strained chicken noodle soup Hard Candy                             4.  In the morning, mix first dose of MoviPrep solution:    Empty 1 Pouch A and 1 Pouch B into the disposable container    Add lukewarm drinking water to the top line of the container. Mix to dissolve    Refrigerate (mixed solution should be used within 24 hrs)  5.  Begin drinking the prep at 5:00 p.m. The MoviPrep container is divided by 4 marks.   Every 15 minutes drink the solution down to the next mark (approximately 8 oz) until the full liter is complete.   6.  Follow completed prep with 16 oz of clear liquid of your choice (Nothing red or purple).   Continue to drink clear liquids until bedtime.  7.  Before going to bed, mix second dose of MoviPrep solution:    Empty 1 Pouch A and 1 Pouch B into the disposable container    Add lukewarm drinking water to the top line of the container. Mix to dissolve    Refrigerate  THE DAY OF YOUR PROCEDURE      DATE: 12-09-09   DAY: Monday  Beginning at 5:30 a.m. (5 hours before procedure):         1. Every 15 minutes, drink the solution down to the next mark (approx 8 oz) until the full liter is complete.  2. Follow completed prep with 16 oz. of clear liquid of your choice.    3. You may drink clear liquids until  8:30 a.m.  (2 HOURS BEFORE PROCEDURE).   MEDICATION INSTRUCTIONS  Unless otherwise instructed, you should take regular prescription medications with a small sip of water   as early as possible the morning of your procedure.   Additional medication instructions: Do NOT take Furosemide on the day  of procedure.         OTHER INSTRUCTIONS  You will need a responsible adult at least 75 years of age to accompany you and drive you home.   This person must remain in the waiting room during your procedure.  Wear loose fitting clothing that is easily removed.  Leave jewelry and other valuables at home.  However, you may wish to bring a book to read or  an iPod/MP3 player to listen to music as you wait for your procedure to start.  Remove all body piercing jewelry and leave at home.  Total time from sign-in until discharge is approximately 2-3 hours.  You should go home directly after your procedure and rest.  You can resume normal activities the  day after your procedure.  The day of your procedure you should not:   Drive   Make legal decisions   Operate machinery   Drink alcohol   Return to work  You will receive specific instructions about eating, activities and medications before you leave.    The above instructions have been reviewed and explained to me by    Everlean Patterson RN  November 25, 2009 1:52 PM    I fully understand and can verbalize these instructions _____________________________ Date _________

## 2010-04-29 NOTE — Assessment & Plan Note (Signed)
Summary: FLU SHOT/CLE   Nurse Visit   Allergies: No Known Drug Allergies  Immunizations Administered:  Influenza Vaccine # 1:    Vaccine Type: Fluvax 3+    Site: left deltoid    Mfr: GlaxoSmithKline    Dose: 0.5 ml    Route: IM    Given by: Edwin Dada CMA ([place])    Exp. Date: 09/26/2009    Lot #: [id number]    VIS given: 10/21/06 version given January 02, 2009.  Flu Vaccine Consent Questions:    Do you have a history of severe allergic reactions to this vaccine? no    Any prior history of allergic reactions to egg and/or gelatin? no    Do you have a sensitivity to the preservative Thimersol? no    Do you have a past history of Guillan-Barre Syndrome? no    Do you currently have an acute febrile illness? no    Have you ever had a severe reaction to latex? no    Vaccine information given and explained to patient? yes    Are you currently pregnant? no  Orders Added: 1)  Flu Vaccine 60yrs + [90658] 2)  Admin 1st Vaccine [id number]

## 2010-04-29 NOTE — Assessment & Plan Note (Signed)
Summary: ?SINUS INFECTION,EAR/CLE   Vital Signs:  Patient Profile:   77 Years Old Female Height:     66 inches Weight:      217 pounds Temp:     97.5 degrees F oral Pulse rate:   56 / minute Pulse rhythm:   regular BP sitting:   120 / 80  (left arm) Cuff size:   large  Vitals Entered By: Gwinda Maine (May 24, 2007 11:52 AM)                 Chief Complaint:  SINUS CONGESTION X 1 MONTH.  History of Present Illness: Is having problem with sinuses on left side forone month, no better today but not bad. Has not had fever, has some chills.  Has some headache in top of head and above left eye and left side of nose, mild ear dull ache of ear, no rhinitis, no ST, no Cough, no SOB, N/V.    Prior Medications Reviewed Using: Patient Recall  Current Allergies: No known allergies       Physical Exam  General:     Well-developed,well-nourished,in no acute distress; alert,appropriate and cooperative throughout examination, sounds clear. Head:     Normocephalic and atraumatic without obvious abnormalities. No apparent alopecia or balding. Sinuses nontender. Eyes:     Conjunctiva clear bilaterally.  Ears:     External ear exam shows no significant lesions or deformities.  Otoscopic examination reveals clear canals, tympanic membranes are intact bilaterally without bulging, retraction, inflammation or discharge. Hearing is grossly normal bilaterally. Nose:     minimally inflamed on left side. Mouth:     Oral mucosa and oropharynx without lesions or exudates.  Teeth in good repair. Mild white PND. Neck:     No deformities, masses, or tenderness noted. Chest Wall:     No deformities, masses, or tenderness noted. Lungs:     Normal respiratory effort, chest expands symmetrically. Lungs are clear to auscultation, no crackles or wheezes. Heart:     Normal rate and regular rhythm. S1 and S2 normal without gallop, murmur, click, rub or other extra sounds.    Impression &  Recommendations:  Problem # 1:  URI (ICD-465.9) Assessment: New Monthlong sxs which appear reasonably mild. Work on getting congestion out per instructions.  Complete Medication List: 1)  Levothyroxine Sodium 50 Mcg Tabs (Levothyroxine sodium) .... Take 1 tablet by mouth once a day 2)  Calcium 600/vitamin D 600-200 Mg-unit Tabs (Calcium carbonate-vitamin d) .... Take 1 tablet by mouth two times a day 3)  Caduet 5-20 Mg Tabs (Amlodipine-atorvastatin) .... Take 1 tablet by mouth once a day 4)  Furosemide 20 Mg Tabs (Furosemide) .... Take 1 tablet by mouth once a day 5)  Flonase 50 Mcg/act Susp (Fluticasone propionate) .... As needed   Patient Instructions: 1)  For congestive sxs, Take: 2)  Guaifenesen 600mg  by mouth AM and NOON for 7-10 days. 3)  Aleeve two tabs by mouth with breakfast and supper. 4)  Push fluids.  5)  GUAIFENESIN  600mg  by mouth AM and NOON   6)    ROBITUSSIN PLAIN (NO LETTERS, NO NAMES) two tablespoons  or 7)    [facility] MUCOUS RELIEF EXPECTORANT (400 mg) 11/2 TABS  or 8)    GUAIFENESIN (200 MG) 3 TABS    9)  Push fluids.                     ]

## 2010-04-29 NOTE — Letter (Signed)
Summary: Colonoscopy Letter  [facility]  [place], [place] [address]   Phone: [phone]  Fax: [phone]      November 06, 2009 MRN: [id number]   [facility] [address] [place], [place]  [address]   Dear Ms. Habibi,   According to your medical record, it is time for you to schedule a Colonoscopy. The American Cancer Society recommends this procedure as a method to detect early colon cancer. Patients with a family history of colon cancer, or a personal history of colon polyps or inflammatory bowel disease are at increased risk.  This letter has beeen generated based on the recommendations made at the time of your procedure. If you feel that in your particular situation this may no longer apply, please contact our office.  Please call our office at [phone] to schedule this appointment or to update your records at your earliest convenience.  Thank you for cooperating with [place] to provide you with the very best care possible.   Sincerely,   Loralee Pacas. Sharlett Iles, M.D.  [facility] Gastroenterology Division [phone]

## 2010-04-29 NOTE — Progress Notes (Signed)
Summary: rf flonase   Phone Note Refill Request Message from:  Fax from Pharmacy on January 19, 2007 2:22 PM  Refills Requested: Medication #1:  FLONASE 50 MCG/ACT  SUSP as needed.   Dosage confirmed as above?Dosage Confirmed   Brand Name Necessary? No   Supply Requested: 1 year [id number] [place] [address]   Initial call taken by: Charlsie Quest,  January 19, 2007 2:30 PM  Follow-up for Phone Call        ok to refill as needed  Follow-up by: Neena Rhymes MD,  January 20, 2007 9:45 AM      Prescriptions: Asencion Islam 50 MCG/ACT  SUSP (FLUTICASONE PROPIONATE) as needed  #0 x prn   Entered by:   Charlsie Quest   Authorized by:   Neena Rhymes MD   Signed by:   Charlsie Quest on 01/20/2007   Method used:   Electronically sent to ...       [facility]  [place]  [address]       [place], [place]  [address]       Ph: [phone] or [phone]       Fax: [phone]   RxID:   [id number]

## 2010-04-29 NOTE — Assessment & Plan Note (Signed)
Summary: CHECK UP/CLE   Vital Signs:  Patient Profile:   77 Years Old Female Height:     66.50 inches Weight:      218 pounds Temp:     97.4 degrees F oral Pulse rate:   68 / minute Pulse rhythm:   regular Resp:     20 per minute BP sitting:   130 / 80  (left arm) Cuff size:   large  Vitals Entered By: Gwinda Maine (April 04, 2008 2:12 PM)                 Chief Complaint:  check up //hemoccult cards to patient.  History of Present Illness: Pt here for Comp Exam. Has seen orthopedist with left shoulder pain. Was xrayed and shoulder was ok but neck has some areas poss causing problem. has improved minimally. Is doing ok. Was given exercise.    Prior Medications Reviewed Using: Patient Recall  Current Allergies: No known allergies    Social History:    Occupation:Admin Asst,[employer]    Retired    Married lives w/ husb  2children   Risk Factors:  Exercise:  yes    Times per week:  3   Review of Systems  Eyes      Denies blurring, discharge, double vision, eye irritation, eye pain, halos, itching, light sensitivity, red eye, vision loss-1 eye, and vision loss-both eyes.  ENT      Denies decreased hearing, difficulty swallowing, ear discharge, earache, hoarseness, nasal congestion, nosebleeds, postnasal drainage, ringing in ears, sinus pressure, and sore throat.  CV      Denies bluish discoloration of lips or nails, chest pain or discomfort, difficulty breathing at night, difficulty breathing while lying down, fainting, fatigue, leg cramps with exertion, lightheadness, near fainting, palpitations, shortness of breath with exertion, swelling of feet, swelling of hands, and weight gain.  Resp      Denies chest discomfort, chest pain with inspiration, cough, coughing up blood, excessive snoring, hypersomnolence, morning headaches, pleuritic, shortness of breath, sputum productive, and wheezing.  GI      Denies abdominal pain, bloody stools, change in bowel  habits, constipation, dark tarry stools, diarrhea, excessive appetite, gas, hemorrhoids, indigestion, loss of appetite, nausea, vomiting, vomiting blood, and yellowish skin color.  GU      Complains of nocturia and urinary frequency.      Denies abnormal vaginal bleeding, decreased libido, discharge, dysuria, genital sores, hematuria, incontinence, and urinary hesitancy.      nocturia oce to twice  MS      Complains of joint pain and stiffness.      neck pain with left radiculopathy  Derm      Complains of rash.      Denies changes in color of skin, changes in nail beds, dryness, excessive perspiration, flushing, hair loss, insect bite(s), itching, lesion(s), and poor wound healing.      left shoulder.  Neuro      Denies brief paralysis, difficulty with concentration, disturbances in coordination, falling down, headaches, inability to speak, memory loss, numbness, poor balance, seizures, sensation of room spinning, tingling, tremors, visual disturbances, and weakness.   Physical Exam  General:     Well-developed,well-nourished,in no acute distress; alert,appropriate and cooperative throughout examination Head:     Normocephalic and atraumatic without obvious abnormalities. No apparent alopecia or balding. Eyes:     Conjunctiva clear bilaterally.  Ears:     External ear exam shows no significant lesions or deformities.  Otoscopic examination  reveals clear canals, tympanic membranes are intact bilaterally without bulging, retraction, inflammation or discharge. Hearing is grossly normal bilaterally. Nose:     External nasal examination shows no deformity or inflammation. Nasal mucosa are pink and moist without lesions or exudates. Mouth:     Oral mucosa and oropharynx without lesions or exudates.  Teeth in good repair. Neck:     No deformities, masses, or tenderness noted. Chest Wall:     No deformities, masses, or tenderness noted. Breasts:     No mass, nodules, thickening,  tenderness, bulging, retraction, inflamation, nipple discharge or skin changes noted.   Lungs:     Normal respiratory effort, chest expands symmetrically. Lungs are clear to auscultation, no crackles or wheezes. Heart:     Normal rate and regular rhythm. S1 and S2 normal without gallop, murmur, click, rub or other extra sounds. Abdomen:     Bowel sounds positive,abdomen soft and non-tender without masses, organomegaly or hernias noted. Rectal:     No external abnormalities noted. Normal sphincter tone. No rectal masses or tenderness. Genitalia:     Bimanual only done Introitus wnl, Uterus and Cervix absent, Adnexa nontender w/o mass, ovaries not felt.  Msk:     No deformity or scoliosis noted of thoracic or lumbar spine.   Pulses:     R and L carotid,radial,femoral,dorsalis pedis and posterior tibial pulses are full and equal bilaterally Extremities:     No clubbing, cyanosis, edema, or deformity noted with normal full range of motion of all joints.   Neurologic:     No cranial nerve deficits noted. Station and gait are normal. Plantar reflexes are down-going bilaterally. DTRs are symmetrical throughout. Sensory, motor and coordinative functions appear intact. Skin:     Intact without suspicious lesions or rashes except mild linear papular rash on the left shoulder. Cervical Nodes:     No lymphadenopathy noted Axillary Nodes:     No palpable lymphadenopathy Inguinal Nodes:     No significant adenopathy Psych:     Cognition and judgment appear intact. Alert and cooperative with normal attention span and concentration. No apparent delusions, illusions, hallucinations    Impression & Recommendations:  Problem # 1:  POSTMENOPAUSAL STATUS, IATROGENIC (ICD-627.2) Assessment: Unchanged Stable.  Problem # 2:  OBESITY (ICD-278.00) Assessment: Unchanged Encouraged to cont exercising and watch calories. Is joining TOPS, goal wt estab at 175.  Problem # 3:  HYPERLIPIDEMIA  (B2193296.4) Assessment: Unchanged Well controlled on Lipitor...cont. Her updated medication list for this problem includes:    Caduet 5-20 Mg Tabs (Amlodipine-atorvastatin) .Marland Kitchen... Take 1 tablet by mouth once a day  Labs Reviewed: Chol: 165 (03/26/2008)   HDL: 45.3 (03/26/2008)   LDL: 96 (03/26/2008)   TG: 121 (03/26/2008) SGOT: 31 (03/26/2008)   SGPT: 27 (03/26/2008)   Problem # 4:  HYPERTENSION (ICD-401.9) Assessment: Unchanged Stable. Her updated medication list for this problem includes:    Caduet 5-20 Mg Tabs (Amlodipine-atorvastatin) .Marland Kitchen... Take 1 tablet by mouth once a day    Furosemide 20 Mg Tabs (Furosemide) .Marland Kitchen... Take 1 tablet by mouth once a day  BP today: 130/80 Prior BP: 120/80 (05/24/2007)  Labs Reviewed: Creat: 1.1 (03/26/2008) Chol: 165 (03/26/2008)   HDL: 45.3 (03/26/2008)   LDL: 96 (03/26/2008)   TG: 121 (03/26/2008)   Problem # 5:  HYPOTHYROIDISM (ICD-244.9) Assessment: Unchanged Euthyroid on current dose. Cont. Her updated medication list for this problem includes:    Levothyroxine Sodium 50 Mcg Tabs (Levothyroxine sodium) .Marland Kitchen... Take 1 tablet by mouth once  a day  Labs Reviewed: TSH: 2.36 (03/26/2008)    Chol: 165 (03/26/2008)   HDL: 45.3 (03/26/2008)   LDL: 96 (03/26/2008)   TG: 121 (03/26/2008)   Complete Medication List: 1)  Levothyroxine Sodium 50 Mcg Tabs (Levothyroxine sodium) .... Take 1 tablet by mouth once a day 2)  Calcium 600/vitamin D 600-200 Mg-unit Tabs (Calcium carbonate-vitamin d) .... Take 1 tablet by mouth two times a day 3)  Caduet 5-20 Mg Tabs (Amlodipine-atorvastatin) .... Take 1 tablet by mouth once a day 4)  Furosemide 20 Mg Tabs (Furosemide) .... Take 1 tablet by mouth once a day 5)  Flonase 50 Mcg/act Susp (Fluticasone propionate) .... As needed   Patient Instructions: 1)  RTC as needed or one year.   ]

## 2010-04-29 NOTE — Progress Notes (Signed)
Summary: flonase  Phone Note Refill Request   Refills Requested: Medication #1:  FLONASE 50 MCG/ACT  SUSP as needed.   Last Refilled: 02/20/2008   Notes: 82mcg 90-day supply requesting 4 refills Received fax from [facility] for the above medication, Member [phone] fax [phone].   Method Requested: Fax to [facility] Next Appointment Scheduled: no future appt's scheduled last seen 04/04/2008 Initial call taken by: Iran Planas CMA,  April 13, 2008 6:40 PM      Prescriptions: Asencion Islam 50 MCG/ACT  SUSP (FLUTICASONE PROPIONATE) as needed  #90 days x 3   Entered by:   Marty Heck   Authorized by:   Raenette Rover MD   Signed by:   Marty Heck on 04/16/2008   Method used:   Electronically to        [facility] (mail-order)             ,          Ph: [phone]       Fax: [phone]   RxIDCZ:[id number]

## 2010-04-29 NOTE — Progress Notes (Signed)
Summary: refill caduet  Phone Note Refill Request Message from:  Fax from Pharmacy on April 30, 2008 4:19 PM  Refills Requested: Medication #1:  CADUET 5-20 MG  TABS Take 1 tablet by mouth once a day fax from [phone]  Initial call taken by: Gwinda Maine,  April 30, 2008 4:20 PM      Prescriptions: CADUET 5-20 MG  TABS (AMLODIPINE-ATORVASTATIN) Take 1 tablet by mouth once a day  #90 x 3   Entered by:   Gwinda Maine   Authorized by:   Raenette Rover MD   Signed by:   Gwinda Maine on 04/30/2008   Method used:   Print then Give to Patient   RxID:   [id number]

## 2010-04-29 NOTE — Assessment & Plan Note (Signed)
Summary: FLU SHOT/CLE  Nurse Visit    Prior Medications: LEVOTHYROXINE SODIUM 50 MCG  TABS (LEVOTHYROXINE SODIUM) Take 1 tablet by mouth once a day CALCIUM 600/VITAMIN D 600-200 MG-UNIT  TABS (CALCIUM CARBONATE-VITAMIN D) Take 1 tablet by mouth two times a day CADUET 5-20 MG  TABS (AMLODIPINE-ATORVASTATIN) Take 1 tablet by mouth once a day FUROSEMIDE 20 MG  TABS (FUROSEMIDE) Take 1 tablet by mouth once a day FLONASE 50 MCG/ACT  SUSP (FLUTICASONE PROPIONATE) as needed Current Allergies: No known allergies     Orders Added: 1)  Flu Vaccine 21yrs + [90658] 2)  Admin 1st Vaccine Joey.Dance    ]  Influenza Vaccine    Vaccine Type: Fluvax 3+    Site: left deltoid    Mfr: Sanofi Pasteur    Dose: 0.5 ml    Route: IM    Given by: Gwinda Maine    Exp. Date: 09/27/2007    Lot #: [id number]    VIS given: 09/26/04 version given January 28, 2007.  Flu Vaccine Consent Questions    Do you have a history of severe allergic reactions to this vaccine? no    Any prior history of allergic reactions to egg and/or gelatin? no    Do you have a sensitivity to the preservative Thimersol? no    Do you have a past history of Guillan-Barre Syndrome? no    Do you currently have an acute febrile illness? no    Have you ever had a severe reaction to latex? no    Vaccine information given and explained to patient? yes    Are you currently pregnant? no

## 2010-04-29 NOTE — Assessment & Plan Note (Signed)
Summary: CHECK UP/CLE      Allergies Added: NKDA  Vital Signs:  Patient Profile:   77 Years Old Female Height:     66 inches Weight:      219 pounds Temp:     98.5 degrees F oral Pulse rate:   64 / minute Pulse rhythm:   regular BP sitting:   120 / 80  (left arm) Cuff size:   large  Vitals Entered By: Gwinda Maine (January 17, 2007 3:11 PM)                 Chief Complaint:  check up // hemocult cards to patient.  History of Present Illness: Has  a cold since last Thu (now Mon), with scratchy throat , hoarseness and now chest congestion. Her husband has this also. Coughing occas, producitve yellow phlegm. Otherwise doing reasonably well.  Current Allergies: No known allergies   Past Surgical History:    Tonsillectomy 1954    total hysterectomy fibroids 1984    colonoscopy 06/07/95    dexa nml 2002    hernia repair 04/25/01    Septoplasty and sinus surg  (Dr Ernesto Rutherford) 2006    Catarract repair bilat [phone]   Family History:    Father dec 70 Lungs (smoker)    Mother dec 93 Stroke CHF    Brother dec 62 Metastatic Ca     Sister dec 72 Stomach Ca    Sister dec 78 ruptured Gallbladder   Risk Factors:  Passive smoke exposure:  no Drug use:  no HIV high-risk behavior:  no Caffeine use:  0 drinks per day Alcohol use:  no Exercise:  yes    Times per week:  1    Type:  swims once, aerobics once, square dance once Seatbelt use:  100 %   Review of Systems  General      Denies chills, fatigue, fever, loss of appetite, malaise, sleep disorder, sweats, weakness, and weight loss.  Eyes      Denies blurring, discharge, double vision, eye irritation, eye pain, halos, itching, light sensitivity, red eye, vision loss-1 eye, and vision loss-both eyes.  ENT      Complains of sinus pressure.      Denies decreased hearing, difficulty swallowing, ear discharge, earache, hoarseness, nasal congestion, nosebleeds, postnasal drainage, ringing in ears, and sore throat.   occas  CV      Denies bluish discoloration of lips or nails, chest pain or discomfort, difficulty breathing at night, difficulty breathing while lying down, fainting, fatigue, leg cramps with exertion, lightheadness, near fainting, palpitations, shortness of breath with exertion, swelling of feet, swelling of hands, and weight gain.  Resp      Denies chest discomfort, chest pain with inspiration, cough, coughing up blood, excessive snoring, hypersomnolence, morning headaches, pleuritic, shortness of breath, sputum productive, and wheezing.  GI      Denies abdominal pain, bloody stools, change in bowel habits, constipation, dark tarry stools, diarrhea, excessive appetite, gas, hemorrhoids, indigestion, loss of appetite, nausea, vomiting, vomiting blood, and yellowish skin color.  GU      Complains of nocturia.      twice  MS      Denies joint pain, joint redness, joint swelling, loss of strength, low back pain, mid back pain, muscle aches, muscle , cramps, muscle weakness, stiffness, and thoracic pain.  Derm      Complains of dryness.      Denies changes in color of skin, changes in nail beds, excessive perspiration,  flushing, hair loss, insect bite(s), itching, lesion(s), poor wound healing, and rash.  Neuro      Complains of memory loss.      Denies brief paralysis, difficulty with concentration, disturbances in coordination, falling down, headaches, inability to speak, numbness, poor balance, seizures, sensation of room spinning, tingling, tremors, visual disturbances, and weakness.   Physical Exam  General:     Well-developed,well-nourished,in no acute distress; alert,appropriate and cooperative throughout examination. Mildly obese. Head:     Normocephalic and atraumatic without obvious abnormalities. No apparent alopecia or balding. Eyes:     Conjunctiva clear bilaterally.  Ears:     External ear exam shows no significant lesions or deformities.  Otoscopic examination reveals  clear canals, tympanic membranes are intact bilaterally without bulging, retraction, inflammation or discharge. Hearing is grossly normal bilaterally. Nose:     External nasal examination shows no deformity or inflammation. Nasal mucosa are pink and moist without lesions or exudates. Mouth:     Oral mucosa and oropharynx without lesions or exudates.  Teeth in good repair. Neck:     No deformities, masses, or tenderness noted. Chest Wall:     No deformities, masses, or tenderness noted. Breasts:     No mass, nodules, thickening, tenderness, bulging, retraction, inflamation, nipple discharge or skin changes noted.   Lungs:     Normal respiratory effort, chest expands symmetrically. Lungs are clear to auscultation, no crackles or wheezes. Heart:     Normal rate and regular rhythm. S1 and S2 normal without gallop, murmur, click, rub or other extra sounds. Abdomen:     Bowel sounds positive,abdomen soft and non-tender without masses, organomegaly or hernias noted. Rectal:     No external abnormalities noted. Normal sphincter tone. No rectal masses or tenderness. G neg. Genitalia:     Bimanual only UT/Cx absent Intr wnl, adnexa NT w/o mass, Ovaries NF. Msk:     No deformity or scoliosis noted of thoracic or lumbar spine.   Pulses:     R and L carotid,radial,femoral,dorsalis pedis and posterior tibial pulses are full and equal bilaterally Extremities:     No clubbing, cyanosis, edema, or deformity noted with normal full range of motion of all joints.   Neurologic:     No cranial nerve deficits noted. Station and gait are normal. Plantar reflexes are down-going bilaterally. DTRs are symmetrical throughout. Sensory, motor and coordinative functions appear intact. Skin:     Intact without suspicious lesions or rashes Cervical Nodes:     No lymphadenopathy noted Axillary Nodes:     No palpable lymphadenopathy Inguinal Nodes:     No significant adenopathy Psych:     Cognition and judgment  appear intact. Alert and cooperative with normal attention span and concentration. No apparent delusions, illusions, hallucinations    Impression & Recommendations:  Problem # 1:  HYPERTENSION (ICD-401.9) Assessment: Unchanged Stable, cont curr med. Her updated medication list for this problem includes:    Caduet 5-20 Mg Tabs (Amlodipine-atorvastatin) .Marland Kitchen... Take 1 tablet by mouth once a day    Furosemide 20 Mg Tabs (Furosemide) .Marland Kitchen... Take 1 tablet by mouth once a day  BP today: 120/80  Labs Reviewed: Creat: 0.9 (01/12/2007) Chol: 149 (01/12/2007)   HDL: 41.5 (01/12/2007)   LDL: 86 (01/12/2007)   TG: 106 (01/12/2007)   Problem # 2:  HYPOTHYROIDISM (ICD-244.9) Assessment: Unchanged Stable, cont curr dose of med. Her updated medication list for this problem includes:    Levothyroxine Sodium 50 Mcg Tabs (Levothyroxine sodium) .Marland Kitchen... Take 1  tablet by mouth once a day  Labs Reviewed: TSH: 2.86 (01/12/2007)    Chol: 149 (01/12/2007)   HDL: 41.5 (01/12/2007)   LDL: 86 (01/12/2007)   TG: 106 (01/12/2007)   Problem # 3:  HYPERLIPIDEMIA (ICD-272.4) Assessment: Improved Good nos, cont curr med. Her updated medication list for this problem includes:    Caduet 5-20 Mg Tabs (Amlodipine-atorvastatin) .Marland Kitchen... Take 1 tablet by mouth once a day  Labs Reviewed: Chol: 149 (01/12/2007)   HDL: 41.5 (01/12/2007)   LDL: 86 (01/12/2007)   TG: 106 (01/12/2007) SGOT: 23 (01/12/2007)   SGPT: 21 (01/12/2007)   Problem # 4:  OBESITY (ICD-278.00) Assessment: Unchanged Discussed diet and exercise. Am not sure pt committed.  Problem # 5:  URI (L6477780.9) See instructions.  Complete Medication List: 1)  Levothyroxine Sodium 50 Mcg Tabs (Levothyroxine sodium) .... Take 1 tablet by mouth once a day 2)  Calcium 600/vitamin D 600-200 Mg-unit Tabs (Calcium carbonate-vitamin d) .... Take 1 tablet by mouth two times a day 3)  Caduet 5-20 Mg Tabs (Amlodipine-atorvastatin) .... Take 1 tablet by mouth once a  day 4)  Furosemide 20 Mg Tabs (Furosemide) .... Take 1 tablet by mouth once a day 5)  Flonase 50 Mcg/act Susp (Fluticasone propionate) .... As needed  PAP Screening:    Last PAP smear:  08/29/1998  Mammogram Screening:    Last Mammogram:  12/08/2006  Mammogram Results:    Date of Exam:  12/08/2006    Results:  Normal Bilateral  Next Mammogram Due:    12/08/2007  Osteoporosis Risk Assessment:  Risk Factors for Fracture or Low Bone Density:   Race (White or Asian):     yes   Smoking status:       quit   Thyroid replacement:     yes   Thyroid disease:     yes  Immunization & Chemoprophylaxis:    Tetanus vaccine: Td  (10/07/1988)   Patient Instructions: 1)   For congestion: 2)  GUAIFENESIN  600mg  by mouth AM and NOON   3)    ROBITUSSIN PLAIN (NO LETTERS, NO NAMES) two tablespoons     or 4)    [facility] MUCOUS RELIEF EXPECTORANT (400 mg) 11/2 TABS  or 5)    GUAIFENESIN (200 MG) 3 TABS     6)  Tyl ES 2 twice a day 7)  Push fluids.  8)  Cont curr meds.    9)  RTC one year, sooner as needed                 ]  Other Immunization History:    Zostavax # 1:  Zostavax (08/11/2006)

## 2010-05-01 NOTE — Progress Notes (Signed)
Summary: form regarding zocor and norvasc  Phone Note From Pharmacy   Caller: [facility] Summary of Call: Form is on your desk regarding zocor and amlodipine and possible interactions.    Initial call taken by: Marty Heck CMA, AAMA,  April 21, 2010 9:21 AM  Follow-up for Phone Call        Filled out. Follow-up by: Raenette Rover MD,  April 23, 2010 7:35 AM  Additional Follow-up for Phone Call Additional follow up Details #1::        Form faxed back to [facility], no change in meds. Additional Follow-up by: Marty Heck CMA, AAMA,  April 23, 2010 9:19 AM

## 2010-05-01 NOTE — Assessment & Plan Note (Signed)
Summary: LEFT EAR/CLE   Vital Signs:  Patient profile:   77 year old female Weight:      216.75 pounds BMI:     34.07 Temp:     97.0 degrees F oral Pulse rate:   60 / minute Pulse rhythm:   regular BP sitting:   150 / 88  (left arm) Cuff size:   large  Vitals Entered By: Emelia Salisbury LPN (January 12, [id number] 2:32 PM) CC: Left ear pain X 1 month   History of Present Illness: Pt here for left ear discomfort with pushing from the back and in the distal canal. She swims twice a wreek and it bothers her in bed. OBTW, her insurance will not cover Caduet. She is scheduled for Comp Exam next month.  Problems Prior to Update: 1)  Postmenopausal Status, Iatrogenic  (ICD-627.2) 2)  Obesity  (ICD-278.00) 3)  Hyperlipidemia  (ICD-272.4) 4)  Hypertension  (ICD-401.9) 5)  Hypothyroidism  (ICD-244.9)  Medications Prior to Update: 1)  Levothyroxine Sodium 50 Mcg  Tabs (Levothyroxine Sodium) .... Take 1 Tablet By Mouth Once A Day 2)  Calcium 600/vitamin D 600-200 Mg-Unit  Tabs (Calcium Carbonate-Vitamin D) .... Take 1 Tablet By Mouth Two Times A Day 3)  Caduet 5-20 Mg  Tabs (Amlodipine-Atorvastatin) .... Take 1 Tablet By Mouth Once A Day 4)  Furosemide 20 Mg  Tabs (Furosemide) .... Take 1 Tablet By Mouth Once A Day 5)  Flonase 50 Mcg/act  Susp (Fluticasone Propionate) .... As Needed 6)  Analpram-Hc Singles 1-1 %  Crea (Hydrocortisone Ace-Pramoxine) .... Apply To Rectum 2 Times As Needed  Allergies: No Known Drug Allergies  Physical Exam  General:  Well-developed,well-nourished,in no acute distress; alert,appropriate and cooperative throughout examination Head:  Normocephalic and atraumatic without obvious abnormalities. No apparent alopecia or balding. Eyes:  Conjunctiva clear bilaterally.  Ears:  External ear exam shows no significant lesions or deformities.  Otoscopic examination reveals right clear canal, tympanic membranes are intact bilaterally without bulging, retraction, inflammation or  discharge. Hearing is grossly normal bilaterally. Left ear canal mildly inflamed with slight erythema and sloughing skin. Nose:  External nasal examination shows no deformity or inflammation. Nasal mucosa are pink and moist without lesions or exudates. L max minimally tender. Mouth:  Oral mucosa and oropharynx without lesions or exudates.  Teeth in good repair. Neck:  No deformities, masses, or tenderness noted. Lungs:  Normal respiratory effort, chest expands symmetrically. Lungs are clear to auscultation, no crackles or wheezes. Heart:  Normal rate and regular rhythm. S1 and S2 normal without gallop, murmur, click, rub or other extra sounds.   Impression & Recommendations:  Problem # 1:  OTITIS EXTERNA, ACUTE, LEFT (ICD-380.12) Assessment New  See instructions. Discussed approach.  Discussed symptomatic treatment and preventive measures.   Problem # 2:  URI (ICD-465.9) Assessment: New  Chronic vs acute in left max area. Discussed approach with Guaif. See instructions.  Instructed on symptomatic treatment. Call if symptoms persist or worsen.   Problem # 3:  HYPERLIPIDEMIA (B2193296.4) Assessment: Unchanged  Change Lipitor 20 to Simva 40. Check labs with Comp Exam. Her updated medication list for this problem includes:    Simvastatin 40 Mg Tabs (Simvastatin) ..... One tab by mouth at night  Labs Reviewed: SGOT: 24 (05/08/2009)   SGPT: 21 (05/08/2009)   HDL:46.50 (05/08/2009), 45.3 (03/26/2008)  LDL:77 (05/08/2009), 96 (03/26/2008)  Chol:148 (05/08/2009), 165 (03/26/2008)  Trig:125.0 (05/08/2009), 121 (03/26/2008)  Problem # 4:  HYPERTENSION (ICD-401.9) Assessment: Deteriorated BP up slightly today but  has been good in the past. Cont Amlodipine. Script written for generic. Her updated medication list for this problem includes:    Amlodipine Besylate 5 Mg Tabs (Amlodipine besylate) ..... One tab by mouth daily    Furosemide 20 Mg Tabs (Furosemide) .Marland Kitchen... Take 1 tablet by mouth  once a day  BP today: 150/88 Prior BP: 124/84 (05/14/2009)  Labs Reviewed: K+: 3.8 (05/08/2009) Creat: : 0.9 (05/08/2009)   Chol: 148 (05/08/2009)   HDL: 46.50 (05/08/2009)   LDL: 77 (05/08/2009)   TG: 125.0 (05/08/2009)  Complete Medication List: 1)  Levothyroxine Sodium 50 Mcg Tabs (Levothyroxine sodium) .... Take 1 tablet by mouth once a day 2)  Calcium 600/vitamin D 600-200 Mg-unit Tabs (Calcium carbonate-vitamin d) .... Take 1 tablet by mouth two times a day 3)  Amlodipine Besylate 5 Mg Tabs (Amlodipine besylate) .... One tab by mouth daily 4)  Furosemide 20 Mg Tabs (Furosemide) .... Take 1 tablet by mouth once a day 5)  Flonase 50 Mcg/act Susp (Fluticasone propionate) .... As needed 6)  Analpram-hc Singles 1-1 % Crea (Hydrocortisone ace-pramoxine) .... Apply to rectum 2 times as needed 7)  Simvastatin 40 Mg Tabs (Simvastatin) .... One tab by mouth at night 8)  Polytrim 10000-0.1 Unit/ml-% Soln (Polymyxin b-trimethoprim) .... 2 gtts left ear three times a day for one week  Patient Instructions: 1)  Use ear droips three times a day for one week. 2)  Also use one drop on Qtip three times a day in outer part of canal. 3)  Follow with drop of Cortaid on Qtip daily to canal. 4)  For acute congestion, take Guaifenesin by [facility] to [facility] or [facility] and getting MUCOUS RELIEF EXPECTORANT (400mg ), take 11/2 tabs by mouth AM and NOON. 5)  Drink lots of fluids anytime taking Guaifenesin.  6)  For chronic congestion, take 1 400mg  Guaif pill evewry AM. 7)  DISCUSS PROPHYL AFTER SWIMMING NEXT VISIT. 8)  Given script to send in to [facility]. Prescriptions: POLYTRIM 10000-0.1 UNIT/ML-% SOLN (POLYMYXIN B-TRIMETHOPRIM) 2 gtts left ear three times a day for one week  #1 bottle x 0   Entered and Authorized by:   Raenette Rover MD   Signed by:   Raenette Rover MD on 04/10/2010   Method used:   Electronically to        [facility]  [address] [place] (retail)       [place], [place]  [address]       Ph: [phone] or [phone]       Fax: [phone]   RxID:   [id number] SIMVASTATIN 40 MG TABS (SIMVASTATIN) one tab by mouth at night  #90 x 3   Entered and Authorized by:   Raenette Rover MD   Signed by:   Raenette Rover MD on 04/10/2010   Method used:   Print then Give to Patient   RxID:   [id number] AMLODIPINE BESYLATE 5 MG TABS (AMLODIPINE BESYLATE) one tab by mouth daily  #90 x 3   Entered and Authorized by:   Raenette Rover MD   Signed by:   Raenette Rover MD on 04/10/2010   Method used:   Print then Give to Patient   RxID:   [id number]    Orders Added: 1)  Est. Patient Level IV [id number]

## 2010-05-19 ENCOUNTER — Other Ambulatory Visit (INDEPENDENT_AMBULATORY_CARE_PROVIDER_SITE_OTHER): Payer: MEDICARE

## 2010-05-19 ENCOUNTER — Other Ambulatory Visit: Payer: Self-pay | Admitting: Family Medicine

## 2010-05-19 ENCOUNTER — Encounter (INDEPENDENT_AMBULATORY_CARE_PROVIDER_SITE_OTHER): Payer: Self-pay | Admitting: *Deleted

## 2010-05-19 DIAGNOSIS — I10 Essential (primary) hypertension: Secondary | ICD-10-CM

## 2010-05-19 DIAGNOSIS — E785 Hyperlipidemia, unspecified: Secondary | ICD-10-CM

## 2010-05-19 DIAGNOSIS — E039 Hypothyroidism, unspecified: Secondary | ICD-10-CM

## 2010-05-19 LAB — LIPID PANEL
Cholesterol: 153 mg/dL (ref 0–200)
HDL: 46.6 mg/dL (ref >39.00)
LDL Cholesterol: 82 mg/dL (ref 0–99)
Total CHOL/HDL Ratio: 3
Triglycerides: 124 mg/dL (ref 0.0–149.0)
VLDL: 24.8 mg/dL (ref 0.0–40.0)

## 2010-05-19 LAB — HEPATIC FUNCTION PANEL
ALT: 20 U/L (ref 0–35)
AST: 23 U/L (ref 0–37)
Albumin: 3.7 g/dL (ref 3.5–5.2)
Alkaline Phosphatase: 57 U/L (ref 39–117)
Bilirubin, Direct: 0.1 mg/dL (ref 0.0–0.3)
Total Bilirubin: 0.5 mg/dL (ref 0.3–1.2)
Total Protein: 6.2 g/dL (ref 6.0–8.3)

## 2010-05-19 LAB — BASIC METABOLIC PANEL
BUN: 20 mg/dL (ref 6–23)
CO2: 30 mEq/L (ref 19–32)
Calcium: 9.3 mg/dL (ref 8.4–10.5)
Chloride: 112 mEq/L (ref 96–112)
Creatinine, Ser: 1.1 mg/dL (ref 0.4–1.2)
GFR: 53.02 mL/min — ABNORMAL LOW (ref >60.00)
Glucose, Bld: 85 mg/dL (ref 70–99)
Potassium: 4.3 mEq/L (ref 3.5–5.1)
Sodium: 145 mEq/L (ref 135–145)

## 2010-05-19 LAB — T4, FREE: Free T4: 0.78 ng/dL (ref 0.60–1.60)

## 2010-05-19 LAB — TSH: TSH: 1.89 u[IU]/mL (ref 0.35–5.50)

## 2010-05-21 ENCOUNTER — Encounter (INDEPENDENT_AMBULATORY_CARE_PROVIDER_SITE_OTHER): Payer: MEDICARE | Admitting: Family Medicine

## 2010-05-21 ENCOUNTER — Encounter: Payer: Self-pay | Admitting: Family Medicine

## 2010-05-21 DIAGNOSIS — Z0000 Encounter for general adult medical examination without abnormal findings: Secondary | ICD-10-CM

## 2010-05-27 NOTE — Letter (Signed)
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

## 2010-05-27 NOTE — Assessment & Plan Note (Signed)
Summary: cpe CLE   Vital Signs:  Patient profile:   77 year old female Weight:      216.50 pounds Temp:     97.8 degrees F oral Pulse rate:   72 / minute Pulse rhythm:   regular BP sitting:   136 / 80  (left arm) Cuff size:   large  Vitals Entered By: Emelia Salisbury LPN (February 22, [id number] 3:12 PM) CC: 30 Minute checkup, has had a hysterectomy   History of Present Illness: Pt here for Comp Exam. She has been slightly sleepy since changing Caduet to Simva and Amlodipine. She otherwise is doing well.  Preventive Screening-Counseling & Management  Alcohol-Tobacco     Alcohol drinks/day: 0     Smoking Status: quit     Year Quit: 1995     Pack years: 25     Passive Smoke Exposure: no  Caffeine-Diet-Exercise     Caffeine use/day: 0     Does Patient Exercise: yes     Type of exercise: swims once     Exercise (avg: min/session): 30-60     Times/week: <3  Problems Prior to Update: 1)  Uri  (ICD-465.9) 2)  Otitis Externa, Acute, Left  (ICD-380.12) 3)  Postmenopausal Status, Iatrogenic  (ICD-627.2) 4)  Obesity  (ICD-278.00) 5)  Hyperlipidemia  (ICD-272.4) 6)  Hypertension  (ICD-401.9) 7)  Hypothyroidism  (ICD-244.9)  Medications Prior to Update: 1)  Levothyroxine Sodium 50 Mcg  Tabs (Levothyroxine Sodium) .... Take 1 Tablet By Mouth Once A Day 2)  Calcium 600/vitamin D 600-200 Mg-Unit  Tabs (Calcium Carbonate-Vitamin D) .... Take 1 Tablet By Mouth Two Times A Day 3)  Amlodipine Besylate 5 Mg Tabs (Amlodipine Besylate) .... One Tab By Mouth Daily 4)  Furosemide 20 Mg  Tabs (Furosemide) .... Take 1 Tablet By Mouth Once A Day 5)  Flonase 50 Mcg/act  Susp (Fluticasone Propionate) .... As Needed 6)  Analpram-Hc Singles 1-1 %  Crea (Hydrocortisone Ace-Pramoxine) .... Apply To Rectum 2 Times As Needed 7)  Simvastatin 40 Mg Tabs (Simvastatin) .... One Tab By Mouth At Night 8)  Polytrim 10000-0.1 Unit/ml-% Soln (Polymyxin B-Trimethoprim) .... 2 Gtts Left Ear Three Times A Day For One  Week  Current Medications (verified): 1)  Levothyroxine Sodium 50 Mcg  Tabs (Levothyroxine Sodium) .... Take 1 Tablet By Mouth Once A Day 2)  Calcium 600/vitamin D 600-200 Mg-Unit  Tabs (Calcium Carbonate-Vitamin D) .... Take 1 Tablet By Mouth Daily 3)  Amlodipine Besylate 5 Mg Tabs (Amlodipine Besylate) .... One Tab By Mouth Daily 4)  Furosemide 20 Mg  Tabs (Furosemide) .... Take 1 Tablet By Mouth Once A Day 5)  Flonase 50 Mcg/act  Susp (Fluticasone Propionate) .... As Needed 6)  Simvastatin 40 Mg Tabs (Simvastatin) .... One Tab By Mouth At Night  Allergies: No Known Drug Allergies  Past History:  Past Medical History: Last updated: 12/15/2006 Hypothyroidism (10/29/1995) Hypertension (10/29/1995) Hyperlipidemia (01/29/1991) Dexa nml 2002  Past Surgical History: Last updated: 01/17/2007 Tonsillectomy 1954 total hysterectomy fibroids 1984 colonoscopy 06/07/95 dexa nml 2002 hernia repair 04/25/01 Septoplasty and sinus surg  (Dr Ernesto Rutherford) 2006 Catarract repair bilat [id number]  Family History: Last updated: 01/17/2007 Father dec 70 Lungs (smoker) Mother dec 93 Stroke CHF Brother dec 62 Metastatic Ca  Sister dec 72 Stomach Ca Sister dec 78 ruptured Gallbladder  Social History: Last updated: 04/04/2008 Occupation:Admin Asst,[employer] Retired Married lives w/ husb  2children  Risk Factors: Alcohol Use: 0 (05/21/2010) Caffeine Use: 0 (05/21/2010) Exercise: yes (05/21/2010)  Risk Factors: Smoking Status: quit (05/21/2010) Passive Smoke Exposure: no (05/21/2010)  Review of Systems General:  Complains of fatigue; denies chills, fever, sweats, weakness, and weight loss. Eyes:  Denies blurring, discharge, and eye pain. ENT:  Denies ear discharge, earache, and ringing in ears. CV:  Complains of fatigue and swelling of feet; denies chest pain or discomfort, fainting, palpitations, shortness of breath with exertion, and swelling of hands. Resp:  Denies cough, shortness of  breath, and wheezing. GI:  Complains of constipation; denies abdominal pain, bloody stools, change in bowel habits, dark tarry stools, diarrhea, indigestion, loss of appetite, nausea, vomiting, vomiting blood, and yellowish skin color. GU:  Complains of nocturia; denies discharge, dysuria, and urinary frequency. MS:  Complains of low back pain and muscle aches; denies joint pain and cramps. Derm:  Denies dryness, itching, and rash. Neuro:  Complains of tingling; denies numbness, poor balance, and tremors; inlegs and feet.  Physical Exam  General:  Well-developed,well-nourished,in no acute distress; alert,appropriate and cooperative throughout examination Head:  Normocephalic and atraumatic without obvious abnormalities. No apparent alopecia or balding. Eyes:  Conjunctiva clear bilaterally.  Ears:  External ear exam shows no significant lesions or deformities.  Otoscopic examination reveals right clear canal, tympanic membranes are intact bilaterally without bulging, retraction, inflammation or discharge. Hearing is grossly normal bilaterally. Left ear canal mildly inflamed with slight erythema and sloughing skin. Nose:  External nasal examination shows no deformity or inflammation. Nasal mucosa are pink and moist without lesions or exudates. L max minimally tender. Mouth:  Oral mucosa and oropharynx without lesions or exudates.  Teeth in good repair. Neck:  No deformities, masses, or tenderness noted. Chest Wall:  No deformities, masses, or tenderness noted. Breasts:  No mass, nodules, thickening, tenderness, bulging, retraction, inflamation, nipple discharge or skin changes noted.   Lungs:  Normal respiratory effort, chest expands symmetrically. Lungs are clear to auscultation, no crackles or wheezes. Heart:  Normal rate and regular rhythm. S1 and S2 normal without gallop, murmur, click, rub or other extra sounds. Abdomen:  Bowel sounds positive,abdomen soft and non-tender without masses,  organomegaly or hernias noted. Rectal:  No external abnormalities noted. Normal sphincter tone. No rectal masses or tenderness. G neg. Genitalia:  Bimanual only done Introitus wnl, Uterus and Cervix absent, Adnexa nontender w/o mass, ovaries not felt.  Msk:  No deformity or scoliosis noted of thoracic or lumbar spine.   Pulses:  R and L carotid,radial,femoral,dorsalis pedis and posterior tibial pulses are full and equal bilaterally Extremities:  No clubbing, cyanosis, edema, or deformity noted with normal full range of motion of all joints.   Neurologic:  No cranial nerve deficits noted. Station and gait are normal. Sensory, motor and coordinative functions appear intact. Skin:  Intact without suspicious lesions or rashes except mild linear papular rash on the left shoulder. Cervical Nodes:  No lymphadenopathy noted Axillary Nodes:  No palpable lymphadenopathy Inguinal Nodes:  No significant adenopathy Psych:  Cognition and judgment appear intact. Alert and cooperative with normal attention span and concentration. No apparent delusions, illusions, hallucinations   Impression & Recommendations:  Problem # 1:  [place] (ICD-V70.0) Assessment Comment Only I have personally reviewed the Medicare Annual Wellness questionnaire and have noted 1.   The patient's medical and social history 2.   Their use of alcohol, tobacco or illicit drugs 3.   Their current medications and supplements 4.   The patient's functional ability including ADL's, fall risks, home safety risks and hearing or visual  impairment. 5.   Diet and physical activities 6.   Evidence for depression or mood disorders  Problem # 2:  OBESITY (ICD-278.00) Assessment: Unchanged  Stable and reasonably mild. Encouraged continued exercise.  Ht: 67 (05/14/2009)   Wt: 216.50 (05/21/2010)   BMI: 34.07 (04/10/2010)  Problem # 3:  HYPERLIPIDEMIA (ICD-272.4) Assessment: Unchanged Stable and well  controlled. Her updated medication list for this problem includes:    Simvastatin 40 Mg Tabs (Simvastatin) ..... One tab by mouth at night  Labs Reviewed: SGOT: 23 (05/19/2010)   SGPT: 20 (05/19/2010)   HDL:46.60 (05/19/2010), 46.50 (05/08/2009)  LDL:82 (05/19/2010), 77 (05/08/2009)  Chol:153 (05/19/2010), 148 (05/08/2009)  Trig:124.0 (05/19/2010), 125.0 (05/08/2009)  Problem # 4:  HYPERTENSION (ICD-401.9) Assessment: Improved Stable. Her updated medication list for this problem includes:    Amlodipine Besylate 5 Mg Tabs (Amlodipine besylate) ..... One tab by mouth daily    Furosemide 20 Mg Tabs (Furosemide) .Marland Kitchen... Take 1 tablet by mouth once a day  BP today: 136/80 Prior BP: 150/88 (04/10/2010)  Labs Reviewed: K+: 4.3 (05/19/2010) Creat: : 1.1 (05/19/2010)   Chol: 153 (05/19/2010)   HDL: 46.60 (05/19/2010)   LDL: 82 (05/19/2010)   TG: 124.0 (05/19/2010)  Problem # 5:  HYPOTHYROIDISM (ICD-244.9) Assessment: Unchanged  Euthyroid on current dose. Her updated medication list for this problem includes:    Levothyroxine Sodium 50 Mcg Tabs (Levothyroxine sodium) .Marland Kitchen... Take 1 tablet by mouth once a day  Labs Reviewed: TSH: 1.89 (05/19/2010)    Chol: 153 (05/19/2010)   HDL: 46.60 (05/19/2010)   LDL: 82 (05/19/2010)   TG: 124.0 (05/19/2010)  Complete Medication List: 1)  Levothyroxine Sodium 50 Mcg Tabs (Levothyroxine sodium) .... Take 1 tablet by mouth once a day 2)  Calcium 600/vitamin D 600-200 Mg-unit Tabs (Calcium carbonate-vitamin d) .... Take 1 tablet by mouth daily 3)  Amlodipine Besylate 5 Mg Tabs (Amlodipine besylate) .... One tab by mouth daily 4)  Furosemide 20 Mg Tabs (Furosemide) .... Take 1 tablet by mouth once a day 5)  Flonase 50 Mcg/act Susp (Fluticasone propionate) .... As needed 6)  Simvastatin 40 Mg Tabs (Simvastatin) .... One tab by mouth at night   Orders Added: 1)  Est. Patient 65& > [id number]    Current Allergies (reviewed today): No known allergies

## 2010-06-20 ENCOUNTER — Other Ambulatory Visit: Payer: Self-pay | Admitting: Family Medicine

## 2010-07-28 ENCOUNTER — Other Ambulatory Visit: Payer: Self-pay | Admitting: Family Medicine

## 2010-07-28 ENCOUNTER — Other Ambulatory Visit: Payer: Self-pay | Admitting: *Deleted

## 2010-07-28 MED ORDER — LEVOTHYROXINE SODIUM 50 MCG PO TABS: 50.0000 ug | ORAL_TABLET | Freq: Every day | ORAL | Status: DC

## 2010-07-28 MED ORDER — FUROSEMIDE 20 MG PO TABS: 20.0000 mg | ORAL_TABLET | Freq: Every day | ORAL | Status: DC

## 2010-08-15 NOTE — Op Note (Signed)
[facility]  Patient:    Lisa [name], Lisa [name] Visit Number: [id number] MRN: [id number]          Service Type: SUR Location: 3W [id number] Attending Physician:  Joya San Dictated by:   Isabel Caprice Hassell Done, M.D. Proc. Date: 04/25/01 Admit Date:  04/25/2001   CC:         Heinz Knuckles. Norins, M.D. [facility]   Operative Report  PREOPERATIVE DIAGNOSIS:  Lower midline ventral hernia from previous hysterectomy done in 1984.  POSTOPERATIVE DIAGNOSIS:  Lower midline ventral hernia from previous hysterectomy done in 1984.  PROCEDURE:  Laparoscopically assisted ventral hernia repair with Parietex mesh.  SURGEON:  Isabel Caprice. Hassell Done, M.D.  ASSISTANT:  Abbott Pao. March Rummage, M.D.  ANESTHESIA:  General endotracheal.  DESCRIPTION OF PROCEDURE:  Lisa [name] is a 77 year old lady who was taken to room #1 and her abdomen was prepped _______ and draped sterilely. I made a transverse incision up in the left upper quadrant and dissected it in an oblique fashion incising the anterior rectus through a pursestring suture of 0 Vicryl and then continuing in oblique fashion, entered the abdomen without difficulty. The abdomen was then insufflated and with a scope in place, I examined the lower abdomen. The patient had previously been noted to have loops of small bowel up in this hernia but these fell away with insufflation. This revealed the hernia defect. It was approximately three inches long and about an inch wide and it was in a transverse lie to the right of the midline. There was a hernia sac that extended more to the patients lateral aspect. I put some needles in to measure this and felt that since this defect was rather small that it could be closed just in a transverse fashion and hopefully incorporating mesh anterior to the fascia to avoid mesh being on the inside of the abdomen. I used some soft Parietex mesh and used a transverse incision just overlying the hernia.  The hernia sac was then opened and it was felt that this was probably the anterior rectus sheath and this was sort of between the recti. I then grabbed the fascia using #1 Prolene and started laterally in incorporating the mesh exterior to the peritoneum and in a running fashion, closed this entire defect. This laid in there nicely. I then took the second layer, which was the other layer of attenuated fascia, and closed over the mesh. I irrigated this area with saline and then I closed the wound with 4-0 Vicryl and benzoin and Steri-Strips. Meanwhile, I looked at the closure from the inside and this appeared to very secure and snug. The bowel was well away from this and it looked like a good secure closure without any untoward event. I then deflated the abdomen and closed the pursestring down in the left upper quadrant. The wounds were injected with Marcaine and closed with 4-0 Vicryl, benzoin and Steri-Strips. The patient seemed to tolerate the procedure well. She will be admitted for overnight observation. Dictated by:   Isabel Caprice Hassell Done, M.D. Attending Physician:  Joya San DD:  04/25/01 TD:  04/25/01 Job: [id number]

## 2010-08-15 NOTE — Assessment & Plan Note (Signed)
[place] OFFICE NOTE   NAME:Lisa [name], Lisa [name]                      MRN:          [id number]  DATE:12/23/2005                            DOB:          04/25/1932    Lisa [name] is a 77 year old Caucasian woman who is followed for type II  hyperlipidemia, hypothyroid disease, and hypertension who presents for  followup evaluation and exam.  She was last seen in the office June 03, 2005, for sinus problems.  In in the interval the patient has seen Dr.  Ernesto Rutherford, did come to surgery July 27, 2005, and had bilateral ethmoid  maxillary sinusitis with turbinate obstruction undergoing bilateral  ethmoidectomy and bilateral maxillary sinus ostial enlargement with  reduction of turbinates.   The patient complains of having burning dysesthesia distal lower extremities  bilaterally, worse at night.   PAST MEDICAL HISTORY:  Is well documented in my note of December 10, 2005,  with only changes as noted above.   CURRENT MEDICATIONS:  1. Levothyroxine 50 mcg daily.  2. Calcium with D.  3. Caduet 5/20 daily.  4. Lasix 20 mg daily.  5. Flonase nasal spray p.r.n.  6. Mucinex p.r.n.   REVIEW OF SYSTEMS:  Negative for constitutional, cardiovascular,  respiratory, GI or GU problems.   EXAMINATION:  VITAL SIGNS:  Temperature was 97.8, blood pressure 142/90 on  the right, 150/92 on the left.  Heart rate was 60.  Weight is 216 pounds.  GENERAL APPEARANCE:  A well-nourished, overweight Caucasian female who looks  her stated age, in no acute distress.  HEENT:  Normocephalic, atraumatic.  The patient has an upper partial.  She  has missing molars on the mandible.  No buccal lesions were noted.  Posterior pharynx was clear.  Conjunctivae and sclerae were clear.  Pupils  equal, round, and reactive to light and accommodation.  Funduscopic  examination was unremarkable.  NECK:  Supple without thyromegaly.  NODES:  No  lymphadenopathy was noted in the cervical or supraclavicular  regions.  CHEST:  No CVA tenderness.  LUNGS:  Clear to auscultation and percussion.  BREAST:  Skin was normal, nipples without discharge.  The patient has  fibrocystic-type changes in both breasts with no fixed mass, lesion or  abnormality.  There was no axillary adenopathy.  CARDIOVASCULAR:  Shows 2+ radial pulse, no JVD or carotid bruits.  She had a  quiet precordium with a regular rate and rhythm without murmurs, rubs, or  gallops.  ABDOMEN:  Soft, no guarding or rebound.  No organosplenomegaly was  appreciated.  PELVIC:  Deferred to patient being status post hysterectomy.  RECTAL:  Deferred.  The patient's last colonoscopy was 2001.  EXTREMITIES:  Without cyanosis or clubbing.  The patient does have mild  swelling of both legs with nonpitting edema.  She has no other deformities.  DERMATOLOGIC:  The patient has mild erythematous skin changes in the distal  lower extremities.  NEUROLOGIC:  The patient is awake, alert, oriented to person, place, time  and context.  Sensation was well-preserved to light touch and pinprick  in  both lower extremities.   DATA BASE:  The patient had a mammogram November 13, 2005, read out as  negative.  This was also done with MM digital screening which was also  negative.   ASSESSMENT AND PLAN:  1. Hypertension.  The patient's blood pressure is adequately controlled on      her present medications.  She will continue the same.  2. Hypothyroid disease.  The patient is for laboratory including TSH and      will adjust her medications as indicated.  3. Hyperlipidemia.  The patient's last lipid profile from December 31, 2004,      reveals a cholesterol of 176, triglycerides 108, HDL 45.4, LDL 109.      The patient has laboratory ordered and pending.  Will adjust her      medications as indicated.  4. Health maintenance.  The patient's last colonoscopy was 2001 which was      a negative study.   The patient would be a candidate for followup study      in 2011.  The patient's current update with mammography is noted.   SUMMARY:  A very pleasant patient who seems to be medically stable at this  time.  She will return to see me on a p.r.n. basis in [place] or she will  be assigned to a new physician at the [facility] office.            ______________________________  Heinz Knuckles. Norins, MD      MEN/MedQ  DD:  12/23/2005  DT:  12/25/2005  Job #:  [id number]   cc:   Kathlen Brunswick

## 2010-08-15 NOTE — Op Note (Signed)
   NAME:  Lisa [name], Lisa [name]                           ACCOUNT NO.:  [id number]   MEDICAL RECORD NO.:  [id number]                   PATIENT TYPE:  AMB   LOCATION:  [place]                                  FACILITY:  [facility]   PHYSICIAN:  Isabel Caprice. Hassell Done, M.D.             DATE OF BIRTH:  1932/05/31   DATE OF PROCEDURE:  01/04/2002  DATE OF DISCHARGE:                                 OPERATIVE REPORT   PREOPERATIVE DIAGNOSES:   POSTOPERATIVE DIAGNOSES:   OPERATION PERFORMED:  Re-excision of melanoma.   SURGEON:  Isabel Caprice. Hassell Done, M.D.   ANESTHESIA:  MAC.   INDICATIONS FOR PROCEDURE:  The patient is a 77 year old lady who underwent  biopsy of a mass in her right shoulder, four fingerbreadths down from her  acromion.  This showed melanoma in situ, lentigo maligna type.   DESCRIPTION OF PROCEDURE:  The patient was taken to room 4 on the afternoon  of October 8 and the arm was prepped with Betadine and draped sterilely.  I  had previously identified the previously biopsied lesion.  I marked it with  an ellipse approximately 3 cm long and about 1.5 cm wide.  The ellipse was  infiltrated with lidocaine and made and came down through the full thickness  dermis into the subcutaneous fat and this was completely excised.  A suture  was placed on the distal margin of the distal most inferior margin.  The  wound was then cauterized and closed with 4-0 Vicryl subcutaneously and with  running 5-0 Prolene.  The patient tolerated the procedure well and will be  given Vicodin to take for pain and will be followed up in the office in  approximately 10 days.                                                 Isabel Caprice Hassell Done, M.D.    MBM/MEDQ  D:  01/04/2002  T:  01/05/2002  Job:  [id number]   cc:   Heinz Knuckles. Norins, M.D. [facility]

## 2010-08-15 NOTE — H&P (Signed)
NAMEELLARIA, HALLOCK NO.:  [id number]   MEDICAL RECORD NO.:  [id number]          PATIENT TYPE:  AMB   LOCATION:  [place]                          FACILITY:  [facility]   PHYSICIAN:  Minna Merritts, M.D.   DATE OF BIRTH:  1932/07/25   DATE OF ADMISSION:  07/27/2005  DATE OF DISCHARGE:                                HISTORY & PHYSICAL   This patient is a 77 year old female who had endoscopic sinus surgery in  1992 where she had ethmoid, sphenoid, and maxillary sinusitis and had sinus  surgery in these regions. She had done reasonably well until more recently  she entered my office with a complaint of persistent sinus infections.  She  had been on Maxifed-G, Ceftin. She continues to drain, but drains cloudy  material and she is having pain also in the ethmoid and maxillary regions.  She has been on previous antibiotics using some of the lesser antibiotics in  the past, but it is felt this did not work very well for her. We upgraded  her to Ceftin, used aggressive decongestant therapy. She still continues to  have problems with her sinuses. A CAT scan was obtained which showed the  ethmoid sinus to be blocked on both sides. The left especially where the  middle turbinate was just pushed right over laterally against the natural  ostium and scarred down. It appeared to be also the maxillary sinuses had  fluid level on both sides, the left again being worse than the right showing  that the natural ostium were just totally obstructed. In the office I could  see no natural ostium, essentially I am expecting to find a pinhole size  opening. The sphenoid sinus, I can see was opened appears to be quite fair  condition. She now enters for bilateral ethmoid sinus ostial enlargement,  with maxillary sinus ostial enlargement, and ethmoid sinus endoscopic sinus  surgery with removal of polyps. Her turbinates have been partially removed  in 1992 by the previous surgeon and we will just try  to readjust those to  get a little better sinus drainage as well as a better airway.   Her past history is one of being on levothyroxine 50 mcg, Caduet 520 daily,  Lasix 20 daily, and Ambi 60/580 one half daily. She also takes multiple  vitamins. She has no allergies to medications.   She does not drink or smoke.   She had a T&A as a child. She had a hysterectomy and appendectomy. She had a  melanoma removed from her right shoulder in 2004 and a hernia repair in  2003. She apparently had an abnormal EKG that was checked out by Dr. Pauline Aus  in 2003 and we compared those old EKGs with the newer ones showing  questionable septal infarct with sinus bradycardia. It appeared to be  unchanged and therefore she was cleared for surgery with this questionable  history. She also has a hypertensive history and she is taking the  medications above mentioned. She also has a thyroid history. She has  cataracts, dentures.   PHYSICAL EXAMINATION:  VITAL  SIGNS: Blood pressure of 138/88, she is 5 feet  9 inches, weighs 217 pounds. She has a heart rate of 57, temperature 97.6.  HEENT: The ears are clear.  The  tympanic membranes are clear and mobile.  The nose is open. Septum is in reasonably good condition. It is going to the  right slightly, but not blocking her nose. Nasal airway reveals the  maxillary sinus, the natural ostium I cannot see, appear to be scarred over.  In the ethmoid sinus, the middle turbinates are lateralized and tend to be  blocking the ethmoid sinus natural ostia. The nasopharynx is clear. The oral  cavity is clear of any ulceration or mass. The larynx is clear of any  ulceration or mass. True cords, false cords, epiglottis, or base of tongue  are clear of ulceration or mass. True cord mobility, gag reflex, tongue  mobility, EOMs, and facial nerve are all symmetrical as is shoulder  strength.  CHEST: Clear with no rales, rhonchi, or wheezes.  CARDIOVASCULAR: No S1 and S2. No  murmurs, rubs, or gallops.  ABDOMEN: Unremarkable.  EXTREMITIES: Unremarkable except for the previous noted histories.   INITIAL DIAGNOSES:  1.  Bilateral recurrent ethmoid and maxillary sinusitis.  2.  History of sinus surgery in 1992.  3.  Status post melanoma, right shoulder resection.  4.  Status post tonsillectomy and adenoidectomy.  5.  Status post hysterectomy and appendectomy.  6.  Status post hernia repair, right.  7.  Hypertension.  8.  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           ______________________________  Minna Merritts, M.D.     JC/MEDQ  D:  07/27/2005  T:  07/27/2005  Job:  [id number]   cc:   Heinz Knuckles. Norins, M.D. [facility]  [address] [address]   David D. Pauline Aus, M.D.  Fax: [phone]

## 2010-08-15 NOTE — Op Note (Signed)
Lisa [name], [name]                 ACCOUNT NO.:  [id number]   MEDICAL RECORD NO.:  [id number]          PATIENT TYPE:  AMB   LOCATION:  [place]                          FACILITY:  [facility]   PHYSICIAN:  Minna Merritts, M.D.   DATE OF BIRTH:  May 09, 1932   DATE OF PROCEDURE:  07/27/2005  DATE OF DISCHARGE:                                 OPERATIVE REPORT   PREOPERATIVE DIAGNOSIS:  Bilateral ethmoid and maxillary sinusitis with  turbinate obstruction of ethmoid sinuses with indiscernible bilateral  maxillary sinus natural ostia.  Previous surgery in 1992.   POSTOPERATIVE DIAGNOSIS:  Bilateral ethmoid and maxillary sinusitis with  turbinate obstruction of ethmoid sinuses with indiscernible bilateral  maxillary sinus natural ostia.  Previous surgery in 1992.   PROCEDURE:  Functional endoscopic sinus surgery bilateral ethmoidectomy,  bilateral maxillary sinus ostial enlargement with reduction of turbinates  and history of previous sinus surgery 1992.   SURGEON:  Minna Merritts, M.D.   ANESTHESIA:  Local MAC by Midge Minium, M.D.   DESCRIPTION OF PROCEDURE:  The patient was placed in the supine position  under local MAC anesthesia and anesthetized with 100 mg of topical cocaine  and 3 mL of 1% Xylocaine with epinephrine.  We were able to inject right in  the specific areas we needed to inject and therefore we could minimize our  use of medication for anesthesia.  The first thing we were able to push the  turbinates, the middle turbinates medial in effort to uncover the natural  ostia of the ethmoid which were essentially covered over by these turbinates  as they appeared to be lateralized.  Once we achieved this, then we could  begin to see the ethmoid sinus opening and with this we then entered this  using the 0 degree scope and the 70 degrees scope and removed scar tissue  from the anterior ethmoid region and some polyps from the midportion of the  ethmoid on the left side.  On the  left side also then we entered and.  We  were looking in the nose with the scope and using the 0 and 7 degree we  could not find the natural ostium of this maxillary sinus, which was  essentially a pin hole.  We used a curved suction to palpate in that area  and find the natural ostium as it fell through the natural ostium and then  we used the side-biting forceps to increase it to approximately 5 times it  normal size, look within the sinus and there was thickened membrane but I  would expect this to revert back to a more normal status.  On the right side  we found a somewhat similar situation where the right ethmoid was again  partially blocked or almost totally blocked by the middle turbinate.  We  pushed this medial, entered the sinus and removed some debris, polypoid  material used the 0 degrees scope, the upbiting and blanket and straight  Blakesley-Weil, and once this was accomplished we then again could not see  the natural ostium of the maxillary sinus and used the  curved suction once  again and once we fell into the natural ostium using this curved suction  then we could use the backbiting forceps and the 0 degrees scope to increase  this is to approximately five times its normal size to improve the natural  sinus drainage.  We suctioned both sinuses of some mucus but the maxillary  sinus mucous membranes on both sides were thickened but appeared to be  reversible in nature.  With this we placed Gelfilm holding the middle  turbinates medial and also Gelfoam within the ethmoid sinus to minimize any  bleeding.  Telfa was placed within the nose.  The patient tolerated the  procedure very well, had no chest pain or hypotensive  episodes.  The patient is aware of the risks and gains here, that she should  not be swimming under water and that she should not do any heavy lifting and  she is to return and see me in my office this afternoon for packing removal  and then to see me in 1  week, 3 weeks, 6 weeks and 3 months and 6 months.           ______________________________  Minna Merritts, M.D.     JC/MEDQ  D:  07/27/2005  T:  07/27/2005  Job:  [id number]   cc:   Heinz Knuckles. Norins, M.D. [facility]  [address]    [place] [address]   Dorene Sorrow, M.D.

## 2010-11-10 ENCOUNTER — Other Ambulatory Visit: Payer: Self-pay | Admitting: Family Medicine

## 2010-11-10 DIAGNOSIS — Z1231 Encounter for screening mammogram for malignant neoplasm of breast: Secondary | ICD-10-CM

## 2010-11-25 ENCOUNTER — Encounter: Payer: Self-pay | Admitting: Family Medicine

## 2010-11-25 ENCOUNTER — Telehealth: Payer: Self-pay | Admitting: *Deleted

## 2010-11-25 NOTE — Telephone Encounter (Signed)
Thank you. Agree.

## 2010-11-25 NOTE — Telephone Encounter (Signed)
Patient scheduled to see Dr. Council Mechanic tomorrow at 2:30.  She thinks she may have a UTI.  Patient wanted to come in and drop off a urine sample but I advised patient that she will need to be evaluated and treated for this.

## 2010-11-26 ENCOUNTER — Ambulatory Visit (INDEPENDENT_AMBULATORY_CARE_PROVIDER_SITE_OTHER): Payer: MEDICARE | Admitting: Family Medicine

## 2010-11-26 ENCOUNTER — Encounter: Payer: Self-pay | Admitting: Family Medicine

## 2010-11-26 VITALS — BP 140/90 | HR 68 | Temp 98.6°F | Wt 215.5 lb

## 2010-11-26 DIAGNOSIS — R300 Dysuria: Secondary | ICD-10-CM

## 2010-11-26 DIAGNOSIS — R609 Edema, unspecified: Secondary | ICD-10-CM

## 2010-11-26 DIAGNOSIS — R600 Localized edema: Secondary | ICD-10-CM | POA: Insufficient documentation

## 2010-11-26 LAB — POCT URINALYSIS DIPSTICK
Bilirubin, UA: NEGATIVE
Blood, UA: NEGATIVE
Glucose, UA: NEGATIVE
Ketones, UA: NEGATIVE
Nitrite, UA: NEGATIVE
Spec Grav, UA: 1.005
Urobilinogen, UA: 0.2
pH, UA: 5

## 2010-11-26 NOTE — Progress Notes (Signed)
  Subjective:    Patient ID: Lisa [name], female    DOB: [birth year], 77 y.o.   MRN: [id number]  HPI Pt here as acute visit after calling in yesterday with urinary symptoms. She has not been able to get her energy back after having sxs two weeks ago. She had urinary burning badly and couldn't go except for a few drops. She started hydrating and drinking cranberry juice and things improved. She has not worsened. She wondered if it could be medication. She had two tears in her right knee meniscus with arthroscopic surgery after a cortisone injection which helped. The surgery has helped. She had been on Celebrex prior to the surgery. She had a rash and had swelling of the lips. She was also given Vicodin. She has also taken a lot of IBP as well. She has not had vaginal discharge or odor.  She has not had back pain but has had some irritation suprapubically.  She also had weeping of the skin of the left leg a few days ago that is now resolved. She has had edema of the legs.    Review of SystemsNoncontributory except as above.       Objective:   Physical Exam  Constitutional: She appears well-developed and well-nourished. No distress.  HENT:  Head: Normocephalic and atraumatic.  Right Ear: External ear normal.  Left Ear: External ear normal.  Nose: Nose normal.  Mouth/Throat: Oropharynx is clear and moist. No oropharyngeal exudate.  Eyes: Conjunctivae and EOM are normal. Pupils are equal, round, and reactive to light.  Neck: Normal range of motion. Neck supple. No thyromegaly present.  Cardiovascular: Normal rate, regular rhythm and normal heart sounds.   Pulmonary/Chest: Effort normal and breath sounds normal. She has no wheezes. She has no rales.  Abdominal: Soft. Bowel sounds are normal. She exhibits no distension. There is no tenderness.       No suprapubic tenderness.  Musculoskeletal: She exhibits edema (very slight edema bilat.).       No CVAT.  Lymphadenopathy:    She has no cervical  adenopathy.  Skin: She is not diaphoretic.          Assessment & Plan:

## 2010-11-26 NOTE — Assessment & Plan Note (Addendum)
Mild edema today. No weeping seen. Avoid salt and canned foods. Suggest support hose (OTC) to control swelling and weeping.

## 2010-11-26 NOTE — Progress Notes (Signed)
Addended by: Modesto Charon on: 11/26/2010 04:42 PM   Modules accepted: Orders

## 2010-11-26 NOTE — Assessment & Plan Note (Signed)
Urine today dips negative and micro looks benign with infrequent WBCs and very rare Epis all that is seen. Will send for culture. If sxs continue by next week or worsen, call and will give short course of AB. Hydrate.

## 2010-11-26 NOTE — Patient Instructions (Signed)
Call if sxs worsen.

## 2010-11-28 LAB — URINE CULTURE
Colony Count: NO GROWTH
Organism ID, Bacteria: NO GROWTH

## 2010-12-22 ENCOUNTER — Ambulatory Visit
Admission: RE | Admit: 2010-12-22 | Discharge: 2010-12-22 | Disposition: A | Discharge status: Home / Self Care | Payer: Medicare Other | Source: Ambulatory Visit | Attending: Family Medicine | Admitting: Family Medicine

## 2010-12-22 DIAGNOSIS — Z1231 Encounter for screening mammogram for malignant neoplasm of breast: Secondary | ICD-10-CM

## 2010-12-30 ENCOUNTER — Ambulatory Visit (INDEPENDENT_AMBULATORY_CARE_PROVIDER_SITE_OTHER): Payer: Medicare Other

## 2010-12-30 DIAGNOSIS — Z23 Encounter for immunization: Secondary | ICD-10-CM

## 2011-01-20 ENCOUNTER — Other Ambulatory Visit: Payer: Self-pay | Admitting: Physician Assistant

## 2011-04-13 ENCOUNTER — Other Ambulatory Visit: Payer: Self-pay | Admitting: Internal Medicine

## 2011-04-13 NOTE — Telephone Encounter (Signed)
Patient is a Systems developer patient and was last seen on 8.29.12.  Is changing PCP to you.  Is it ok to refill these medications.

## 2011-04-14 MED ORDER — LEVOTHYROXINE SODIUM 50 MCG PO TABS: 50.0000 ug | ORAL_TABLET | Freq: Every day | ORAL | Status: DC

## 2011-04-14 MED ORDER — AMLODIPINE BESYLATE 5 MG PO TABS: 5.0000 mg | ORAL_TABLET | Freq: Every day | ORAL | Status: DC

## 2011-04-14 NOTE — Telephone Encounter (Signed)
rx sent, keep lab/OV next month.

## 2011-04-20 ENCOUNTER — Other Ambulatory Visit: Payer: Self-pay | Admitting: *Deleted

## 2011-04-20 MED ORDER — SIMVASTATIN 40 MG PO TABS: 40.0000 mg | ORAL_TABLET | Freq: Every day | ORAL | Status: DC

## 2011-04-20 MED ORDER — LEVOTHYROXINE SODIUM 50 MCG PO TABS: 50.0000 ug | ORAL_TABLET | Freq: Every day | ORAL | Status: DC

## 2011-04-20 MED ORDER — AMLODIPINE BESYLATE 5 MG PO TABS: 5.0000 mg | ORAL_TABLET | Freq: Every day | ORAL | Status: DC

## 2011-04-20 NOTE — Telephone Encounter (Signed)
Patient called stating that she needs a 30 day supply on Amlodipine and Simvastatin sent to [facility] to last her until she gets her mail order from [facility]. Patient wants to make sure that her rx for Levothyroxine is written so that [facility] can give her generic. Please confirm.

## 2011-04-21 MED ORDER — SIMVASTATIN 40 MG PO TABS: 40.0000 mg | ORAL_TABLET | Freq: Every day | ORAL | Status: DC

## 2011-04-21 MED ORDER — AMLODIPINE BESYLATE 5 MG PO TABS: 5.0000 mg | ORAL_TABLET | Freq: Every day | ORAL | Status: DC

## 2011-04-21 NOTE — Telephone Encounter (Signed)
Patient advised.

## 2011-04-21 NOTE — Telephone Encounter (Signed)
rx sent locally.  Thyroid med was sent in a generic.

## 2011-04-30 ENCOUNTER — Telehealth: Payer: Self-pay | Admitting: *Deleted

## 2011-04-30 NOTE — Telephone Encounter (Signed)
Patient is currently taking Amlodipine 5 mg and Simvastatin 40 mg.     There is a new recommendation that states that Simvastatin should not exceed 20 mg if the patient is also taking Amlodipine.  Just needing to be sure that it is okay to dispense these 2 medications.  Phone:  [phone]  Ref  [id number].

## 2011-05-01 ENCOUNTER — Other Ambulatory Visit: Payer: Self-pay | Admitting: *Deleted

## 2011-05-01 MED ORDER — SIMVASTATIN 40 MG PO TABS: 40.0000 mg | ORAL_TABLET | Freq: Every day | ORAL | Status: DC

## 2011-05-01 MED ORDER — AMLODIPINE BESYLATE 5 MG PO TABS: 5.0000 mg | ORAL_TABLET | Freq: Every day | ORAL | Status: DC

## 2011-05-01 NOTE — Telephone Encounter (Signed)
LMOVM of [facility].

## 2011-05-01 NOTE — Telephone Encounter (Signed)
I would continue as is.  We can discuss at OV if needed.

## 2011-05-10 ENCOUNTER — Other Ambulatory Visit: Payer: Self-pay | Admitting: Family Medicine

## 2011-05-10 DIAGNOSIS — E7800 Pure hypercholesterolemia, unspecified: Secondary | ICD-10-CM

## 2011-05-10 DIAGNOSIS — E039 Hypothyroidism, unspecified: Secondary | ICD-10-CM

## 2011-05-14 ENCOUNTER — Other Ambulatory Visit (INDEPENDENT_AMBULATORY_CARE_PROVIDER_SITE_OTHER): Payer: Medicare Other

## 2011-05-14 DIAGNOSIS — E7800 Pure hypercholesterolemia, unspecified: Secondary | ICD-10-CM

## 2011-05-14 DIAGNOSIS — E039 Hypothyroidism, unspecified: Secondary | ICD-10-CM

## 2011-05-14 LAB — COMPREHENSIVE METABOLIC PANEL
ALT: 20 U/L (ref 0–35)
AST: 23 U/L (ref 0–37)
Albumin: 3.8 g/dL (ref 3.5–5.2)
Alkaline Phosphatase: 62 U/L (ref 39–117)
BUN: 17 mg/dL (ref 6–23)
CO2: 28 mEq/L (ref 19–32)
Calcium: 9.4 mg/dL (ref 8.4–10.5)
Chloride: 110 mEq/L (ref 96–112)
Creatinine, Ser: 1.1 mg/dL (ref 0.4–1.2)
GFR: 50.69 mL/min — ABNORMAL LOW (ref >60.00)
Glucose, Bld: 86 mg/dL (ref 70–99)
Potassium: 3.8 mEq/L (ref 3.5–5.1)
Sodium: 145 mEq/L (ref 135–145)
Total Bilirubin: 0.7 mg/dL (ref 0.3–1.2)
Total Protein: 6.6 g/dL (ref 6.0–8.3)

## 2011-05-14 LAB — LIPID PANEL
Cholesterol: 181 mg/dL (ref 0–200)
HDL: 49.6 mg/dL (ref >39.00)
LDL Cholesterol: 112 mg/dL — ABNORMAL HIGH (ref 0–99)
Total CHOL/HDL Ratio: 4
Triglycerides: 99 mg/dL (ref 0.0–149.0)
VLDL: 19.8 mg/dL (ref 0.0–40.0)

## 2011-05-14 LAB — TSH: TSH: 4.65 u[IU]/mL (ref 0.35–5.50)

## 2011-05-19 ENCOUNTER — Encounter: Payer: Self-pay | Admitting: Family Medicine

## 2011-05-19 ENCOUNTER — Other Ambulatory Visit: Payer: Self-pay | Admitting: *Deleted

## 2011-05-19 ENCOUNTER — Ambulatory Visit (INDEPENDENT_AMBULATORY_CARE_PROVIDER_SITE_OTHER): Payer: Medicare Other | Admitting: Family Medicine

## 2011-05-19 VITALS — BP 122/76 | HR 93 | Temp 99.8°F | Ht 65.25 in | Wt 216.0 lb

## 2011-05-19 DIAGNOSIS — R600 Localized edema: Secondary | ICD-10-CM

## 2011-05-19 DIAGNOSIS — R609 Edema, unspecified: Secondary | ICD-10-CM

## 2011-05-19 DIAGNOSIS — Z780 Asymptomatic menopausal state: Secondary | ICD-10-CM

## 2011-05-19 DIAGNOSIS — E785 Hyperlipidemia, unspecified: Secondary | ICD-10-CM

## 2011-05-19 DIAGNOSIS — E039 Hypothyroidism, unspecified: Secondary | ICD-10-CM

## 2011-05-19 DIAGNOSIS — I10 Essential (primary) hypertension: Secondary | ICD-10-CM

## 2011-05-19 DIAGNOSIS — R300 Dysuria: Secondary | ICD-10-CM

## 2011-05-19 DIAGNOSIS — E669 Obesity, unspecified: Secondary | ICD-10-CM

## 2011-05-19 DIAGNOSIS — E7800 Pure hypercholesterolemia, unspecified: Secondary | ICD-10-CM

## 2011-05-19 LAB — POCT URINALYSIS DIPSTICK
Bilirubin, UA: NEGATIVE
Glucose, UA: NEGATIVE
Ketones, UA: NEGATIVE
Nitrite, UA: NEGATIVE
Spec Grav, UA: 1.015
Urobilinogen, UA: NEGATIVE
pH, UA: 6

## 2011-05-19 MED ORDER — SIMVASTATIN 40 MG PO TABS: 40.0000 mg | ORAL_TABLET | Freq: Every day | ORAL | Status: DC

## 2011-05-19 MED ORDER — AMLODIPINE BESYLATE 5 MG PO TABS: 5.0000 mg | ORAL_TABLET | Freq: Every day | ORAL | Status: DC

## 2011-05-19 MED ORDER — ATORVASTATIN CALCIUM 20 MG PO TABS: 20.0000 mg | ORAL_TABLET | Freq: Every day | ORAL | Status: DC

## 2011-05-19 NOTE — Progress Notes (Signed)
Chills and sweats last night.  Better this AM.  Vomited this AM.  Vomitus was watery.  No diarrhea.  No abd pain.  GI illness in community recently.    Elevated Cholesterol: Using medications without problems: yes Muscle aches: yes, only on simvastatin Diet compliance:yes Exercise:yes She had trouble getting her meds filled.  We talked about this today.   Hypertension:    Using medication without problems or lightheadedness: yes Chest pain with exertion:no Edema:yes, at baseline, lasix helps some.  Able to swim w/o SOB.    Recently with some dysuria, improved now.    Hypothyroid, labs d/w pt. No neck mass. No ADE, compliant.    DXA will be due in 10/2011  PMH and SH reviewed  Meds, vitals, and allergies reviewed.   ROS: See HPI.  Otherwise negative.    GEN: nad, alert and oriented HEENT: mucous membranes moist NECK: supple w/o LA CV: rrr. PULM: ctab, no inc wob ABD: soft, +bs EXT: L>R ankle minimally puffy SKIN: no acute rash

## 2011-05-19 NOTE — Patient Instructions (Addendum)
See Rosaria Ferries about your referral before you leave today. Change to lipitor 20mg  a day.  Recheck lipids 2 months after you start the lipitor.  Let me know if you aren't feeling better in a few days.  Drink plenty of fluids in the meantime.   We'll contact you with your lab report.

## 2011-05-21 ENCOUNTER — Encounter: Payer: Self-pay | Admitting: Family Medicine

## 2011-05-21 NOTE — Assessment & Plan Note (Signed)
Controlled , no change in meds 

## 2011-05-21 NOTE — Assessment & Plan Note (Signed)
She's working on this via exercise.

## 2011-05-21 NOTE — Assessment & Plan Note (Signed)
TSH wnl, no change in meds.

## 2011-05-21 NOTE — Assessment & Plan Note (Signed)
ucx pending, no meds for now as sx improved.

## 2011-05-21 NOTE — Assessment & Plan Note (Signed)
Minimal, will follow.

## 2011-05-21 NOTE — Assessment & Plan Note (Signed)
Change back to atorvastatin due to aches on simva.  Recheck lipids later in 2013.

## 2011-05-22 ENCOUNTER — Other Ambulatory Visit: Payer: Self-pay | Admitting: *Deleted

## 2011-05-22 LAB — URINE CULTURE: Colony Count: 75000

## 2011-05-22 MED ORDER — SULFAMETHOXAZOLE-TRIMETHOPRIM 800-160 MG PO TABS: 1.0000 | ORAL_TABLET | Freq: Two times a day (BID) | ORAL | Status: AC

## 2011-06-05 ENCOUNTER — Other Ambulatory Visit: Payer: Self-pay | Admitting: *Deleted

## 2011-06-05 ENCOUNTER — Encounter: Payer: Self-pay | Admitting: Family Medicine

## 2011-06-05 ENCOUNTER — Telehealth: Payer: Self-pay | Admitting: *Deleted

## 2011-06-05 MED ORDER — ATORVASTATIN CALCIUM 20 MG PO TABS: 20.0000 mg | ORAL_TABLET | Freq: Every day | ORAL | Status: DC

## 2011-06-05 MED ORDER — FUROSEMIDE 20 MG PO TABS: 20.0000 mg | ORAL_TABLET | Freq: Every day | ORAL | Status: DC

## 2011-06-05 NOTE — Telephone Encounter (Signed)
Patient says she had been having muscle problems, etc that she does believe was related to the Simvastatin so she stopped it as you had suggested.  The side effects did resolve with stopping the medication.  She says that there had been mention of just stopping anyway (without a replacement med) since her numbers had been good.  She says she took a few pills of the Atorvastatin but then decided she would try to discontinue it altogether.  She has been off of any medication for over a month now.  I told her that you may want her to get some lab work to check her levels off of the medication.  She agrees.  Please advise.  See written note in your in box.  However, this summarizes the note.

## 2011-06-05 NOTE — Telephone Encounter (Signed)
LMOVM to call in for lab appt only in 1 month.

## 2011-06-05 NOTE — Telephone Encounter (Signed)
She has a future order for lipids.  I would check that in about 1 month.  Fasting.  No OV, just lab visit.  Thanks.

## 2011-06-25 ENCOUNTER — Other Ambulatory Visit: Payer: Self-pay | Admitting: *Deleted

## 2011-06-25 MED ORDER — FLUTICASONE PROPIONATE 50 MCG/ACT NA SUSP: 2.0000 | Freq: Every day | NASAL | Status: DC

## 2011-07-06 ENCOUNTER — Other Ambulatory Visit (INDEPENDENT_AMBULATORY_CARE_PROVIDER_SITE_OTHER): Payer: Medicare Other

## 2011-07-06 DIAGNOSIS — E7800 Pure hypercholesterolemia, unspecified: Secondary | ICD-10-CM

## 2011-07-06 LAB — LIPID PANEL
Cholesterol: 237 mg/dL — ABNORMAL HIGH (ref 0–200)
HDL: 42.2 mg/dL (ref >39.00)
Total CHOL/HDL Ratio: 6
Triglycerides: 133 mg/dL (ref 0.0–149.0)
VLDL: 26.6 mg/dL (ref 0.0–40.0)

## 2011-07-06 LAB — LDL CHOLESTEROL, DIRECT: Direct LDL: 165.5 mg/dL

## 2011-07-08 ENCOUNTER — Telehealth: Payer: Self-pay | Admitting: Family Medicine

## 2011-07-08 DIAGNOSIS — E7800 Pure hypercholesterolemia, unspecified: Secondary | ICD-10-CM

## 2011-07-08 NOTE — Telephone Encounter (Signed)
Call pt.  Lipids are up.  I would try the lipitor.  If she has a reaction like she did with simvastatin, then stop the med and notify the clinic.  If she tolerates it, then recheck lipids in 2 months.  Thanks.  Orders are in.

## 2011-07-08 NOTE — Telephone Encounter (Signed)
Left message on machine to call back  

## 2011-07-09 NOTE — Telephone Encounter (Signed)
Patient advised.  Lab appt scheduled.  

## 2011-07-14 ENCOUNTER — Telehealth: Payer: Self-pay

## 2011-07-14 NOTE — Telephone Encounter (Signed)
Pt walked in with letter from [facility] re: to [facility] filling name brand Synthroid rather than generic and charging generic co pay and that was not [facility] policy. I called [phone] spoke with pharmacist Gilmore Laroche and she said it is in the system pt is to get generic Levothyroxine not the name brand. Gilmore Laroche said pt should not have received this letter. Patient notified as instructed by telephone. Letter sent for scanning.

## 2011-07-16 ENCOUNTER — Other Ambulatory Visit: Payer: Self-pay

## 2011-07-16 NOTE — Telephone Encounter (Signed)
Pt requested refill Amlodipine 5 mg sent to [facility]. Pt to contact [facility] for refill sent in 05/19/11 for #90 x 3.

## 2011-07-21 ENCOUNTER — Telehealth: Payer: Self-pay | Admitting: *Deleted

## 2011-07-21 NOTE — Telephone Encounter (Signed)
Tier Exception Request Form is in your In Box for Atorvastatin 20 mg. Daily.

## 2011-07-21 NOTE — Telephone Encounter (Signed)
Form faxed and given to Rena to hold for approval or denial.

## 2011-07-21 NOTE — Telephone Encounter (Signed)
In my outbox.  Thanks.

## 2011-07-23 NOTE — Telephone Encounter (Signed)
Lisa [name] with [facility] [employer] said no prior auth needed as of 06/29/11. Patient notified as instructed by telephone. Pt said no pharmacy to contact she was trying to take care of before next refill.

## 2011-09-09 ENCOUNTER — Other Ambulatory Visit (INDEPENDENT_AMBULATORY_CARE_PROVIDER_SITE_OTHER): Payer: Medicare Other

## 2011-09-09 DIAGNOSIS — E7800 Pure hypercholesterolemia, unspecified: Secondary | ICD-10-CM

## 2011-09-09 LAB — LIPID PANEL
Cholesterol: 161 mg/dL (ref 0–200)
HDL: 46.9 mg/dL (ref >39.00)
LDL Cholesterol: 82 mg/dL (ref 0–99)
Total CHOL/HDL Ratio: 3
Triglycerides: 161 mg/dL — ABNORMAL HIGH (ref 0.0–149.0)
VLDL: 32.2 mg/dL (ref 0.0–40.0)

## 2011-09-10 ENCOUNTER — Encounter: Payer: Self-pay | Admitting: *Deleted

## 2011-11-23 ENCOUNTER — Other Ambulatory Visit: Payer: Self-pay | Admitting: *Deleted

## 2011-11-23 MED ORDER — LEVOTHYROXINE SODIUM 50 MCG PO TABS: 50.0000 ug | ORAL_TABLET | Freq: Every day | ORAL | Status: DC

## 2011-12-01 ENCOUNTER — Other Ambulatory Visit: Payer: Self-pay | Admitting: Family Medicine

## 2011-12-01 ENCOUNTER — Other Ambulatory Visit: Payer: Self-pay

## 2011-12-01 DIAGNOSIS — Z1231 Encounter for screening mammogram for malignant neoplasm of breast: Secondary | ICD-10-CM

## 2011-12-01 NOTE — Telephone Encounter (Signed)
Pt request status of levothyroxine refill. Refilled on 11/23/11 pt will ck with [facility] to see if processing.

## 2011-12-18 ENCOUNTER — Ambulatory Visit (INDEPENDENT_AMBULATORY_CARE_PROVIDER_SITE_OTHER): Payer: Medicare Other

## 2011-12-18 DIAGNOSIS — Z23 Encounter for immunization: Secondary | ICD-10-CM

## 2012-01-04 ENCOUNTER — Ambulatory Visit
Admission: RE | Admit: 2012-01-04 | Discharge: 2012-01-04 | Disposition: A | Discharge status: Home / Self Care | Payer: Medicare Other | Source: Ambulatory Visit | Attending: Family Medicine | Admitting: Family Medicine

## 2012-01-04 DIAGNOSIS — Z780 Asymptomatic menopausal state: Secondary | ICD-10-CM

## 2012-01-04 DIAGNOSIS — Z1231 Encounter for screening mammogram for malignant neoplasm of breast: Secondary | ICD-10-CM

## 2012-01-05 ENCOUNTER — Encounter: Payer: Self-pay | Admitting: *Deleted

## 2012-01-07 ENCOUNTER — Encounter: Payer: Self-pay | Admitting: *Deleted

## 2012-02-22 ENCOUNTER — Other Ambulatory Visit: Payer: Self-pay

## 2012-02-22 MED ORDER — FLUTICASONE PROPIONATE 50 MCG/ACT NA SUSP: 2.0000 | Freq: Every day | NASAL | Status: DC

## 2012-02-22 NOTE — Telephone Encounter (Signed)
Pt left v/m requesting 3 month refill flonase to [facility]. Pt  Has CPX 05/19/12.Pt notified refill done.

## 2012-05-17 ENCOUNTER — Telehealth: Payer: Self-pay | Admitting: Family Medicine

## 2012-05-17 NOTE — Telephone Encounter (Signed)
Pt is having burning and tingling in her legs along with a runny nose and muscle pain/weakness from taking Lipitor.  She has physical scheduled for 05/19/12 but wanted to let Dr. Damita Dunnings know beforehand.

## 2012-05-17 NOTE — Telephone Encounter (Signed)
I would stop the lipitor and we'll discuss at the [place].  Thanks.

## 2012-05-17 NOTE — Telephone Encounter (Signed)
LMOVM of home phone.

## 2012-05-19 ENCOUNTER — Ambulatory Visit (INDEPENDENT_AMBULATORY_CARE_PROVIDER_SITE_OTHER): Payer: Medicare Other | Admitting: Family Medicine

## 2012-05-19 ENCOUNTER — Encounter: Payer: Self-pay | Admitting: Family Medicine

## 2012-05-19 VITALS — BP 148/98 | HR 64 | Temp 98.3°F | Ht 65.5 in | Wt 214.0 lb

## 2012-05-19 DIAGNOSIS — I10 Essential (primary) hypertension: Secondary | ICD-10-CM

## 2012-05-19 DIAGNOSIS — E039 Hypothyroidism, unspecified: Secondary | ICD-10-CM

## 2012-05-19 DIAGNOSIS — E7800 Pure hypercholesterolemia, unspecified: Secondary | ICD-10-CM

## 2012-05-19 DIAGNOSIS — E785 Hyperlipidemia, unspecified: Secondary | ICD-10-CM

## 2012-05-19 DIAGNOSIS — Z0000 Encounter for general adult medical examination without abnormal findings: Secondary | ICD-10-CM

## 2012-05-19 MED ORDER — FUROSEMIDE 20 MG PO TABS: 20.0000 mg | ORAL_TABLET | Freq: Every day | ORAL | Status: DC

## 2012-05-19 MED ORDER — LEVOTHYROXINE SODIUM 50 MCG PO TABS: 50.0000 ug | ORAL_TABLET | Freq: Every day | ORAL | Status: DC

## 2012-05-19 MED ORDER — AMLODIPINE BESYLATE 5 MG PO TABS: 5.0000 mg | ORAL_TABLET | Freq: Every day | ORAL | Status: DC

## 2012-05-19 MED ORDER — FLUTICASONE PROPIONATE 50 MCG/ACT NA SUSP: 2.0000 | Freq: Every day | NASAL | Status: DC

## 2012-05-19 MED ORDER — ATORVASTATIN CALCIUM 20 MG PO TABS: 10.0000 mg | ORAL_TABLET | Freq: Every day | ORAL | Status: DC

## 2012-05-19 NOTE — Assessment & Plan Note (Signed)
Some fatigue noted.  No TMG on exam.  Will recheck TSH with other labs as planned.  She agrees.

## 2012-05-19 NOTE — Progress Notes (Signed)
I have personally reviewed the Medicare Annual Wellness questionnaire and have noted 1. The patient's medical and social history 2. Their use of alcohol, tobacco or illicit drugs 3. Their current medications and supplements 4. The patient's functional ability including ADL's, fall risks, home safety risks and hearing or visual             impairment. 5. Diet and physical activities 6. Evidence for depression or mood disorders  The patients weight, height, BMI have been recorded in the chart and visual acuity is per eye clinic.  I have made referrals, counseling and provided education to the patient based review of the above and I have provided the pt with a written personalized care plan for preventive services.  See scanned forms.  Routine anticipatory guidance given to patient.  See health maintenance. Flu 2013 Shingles 2008 PNA 2001 Tetanus 2011 Colonoscopy 2011 Breast cancer screening up to date.  Advance directive d/w pt.  Husband would be designated first, then granddaughter Carver Fila if he were unavailable.   Cognitive function addressed- see scanned forms- and if abnormal then additional documentation follows.   Elevated Cholesterol: Using medications without problems:yes Muscle aches: yes when on lipitor, some better already after holding it for 1 day Diet compliance:partial, d/w pt.  Exercise: limited recently.   Hypertension:    Using medication without problems or lightheadedness: yes Chest pain with exertion:no Edema:at baseline  Short of breath:no  Hypothyroid.  Some fatigue, unclear if statin related.  No neck mass. No dysphagia.    PMH and SH reviewed  Meds, vitals, and allergies reviewed.   ROS: See HPI.  Otherwise negative.    GEN: nad, alert and oriented HEENT: mucous membranes moist NECK: supple w/o LA, no TMG CV: rrr. PULM: ctab, no inc wob ABD: soft, +bs EXT: no edema SKIN: no acute rash

## 2012-05-19 NOTE — Patient Instructions (Addendum)
Stay off the lipitor for about 1 week.  When you are feeling better, start back at 1/2 tab (10mg ) a day.  Recheck labs in about 1 month- fasting. We'll contact you with your lab report. Let me know if your BP continues to be >140/>90.  Take care.

## 2012-05-19 NOTE — Assessment & Plan Note (Signed)
Hold statin for now, then restart atorva at 10mg  a day.  Recheck labs after that.  If aches return, she'll notify us.  She agrees.

## 2012-05-19 NOTE — Assessment & Plan Note (Signed)
She's going to work on weight and diet.  She hasn't been exercising as much.  If still elevated, we can address.  Continue as is for now.  She agree.

## 2012-05-19 NOTE — Assessment & Plan Note (Signed)
See scanned forms.  Routine anticipatory guidance given to patient.  See health maintenance. Flu 2013 Shingles 2008 PNA 2001 Tetanus 2011 Colonoscopy 2011 Breast cancer screening up to date.  Advance directive d/w pt.  Husband would be designated first, then granddaughter Carver Fila if he were unavailable.   Cognitive function addressed- see scanned forms- and if abnormal then additional documentation follows.

## 2012-05-27 ENCOUNTER — Encounter: Payer: Self-pay | Admitting: Cardiovascular Disease

## 2012-05-27 ENCOUNTER — Ambulatory Visit (INDEPENDENT_AMBULATORY_CARE_PROVIDER_SITE_OTHER): Payer: Medicare Other | Admitting: Cardiovascular Disease

## 2012-05-27 VITALS — BP 184/110 | HR 66 | Ht 65.5 in | Wt 211.0 lb

## 2012-05-27 DIAGNOSIS — I10 Essential (primary) hypertension: Secondary | ICD-10-CM

## 2012-05-27 MED ORDER — LOSARTAN POTASSIUM-HCTZ 50-12.5 MG PO TABS: 1.0000 | ORAL_TABLET | Freq: Every day | ORAL | Status: DC

## 2012-05-27 NOTE — Patient Instructions (Addendum)
Your physician has recommended you make the following change in your medication: STOP Furosemide, START Losartan HCT 50/12.5mg  take one by mouth daily  Your physician has requested that you have a lexiscan myoview. For further information please visit [email or web address]. Please follow instruction sheet, as given.  Your physician recommends that you schedule a follow-up appointment in: 1 MONTH with Dr Burt Knack  Your physician recommends that you return for lab work: BMP (this can be drawn with your labs at [facility] on 06/15/12)

## 2012-05-27 NOTE — Progress Notes (Signed)
HPI:  79 year old woman presenting for evaluation of hypertension and chest pain. The patient has recently had difficulty controlling her blood pressure. She was diagnosed with hypertension several years ago. She currently takes amlodipine and has been on this for greater than one year by her recollection. She's been on other antihypertensive medications and doesn't remember why they were stopped in the past. She's noted recent elevations of her blood pressure with diastolic readings greater than 100. She also has developed substernal chest tightness and heaviness when she is using her arms. She has not had chest pain or pressure with walking or other types of exertion. She is not engaged in any regular exercise. She has dyspnea with moderate level activities and this is unchanged. She denies orthopnea, PND, lightheadedness, or syncope. She also complains of leg swelling and she takes furosemide 20 mg daily. She has no personal history of cardiac disease. She has no history of stroke or TIA. She has not had a cardiac stress testing or catheterization in the past.  Outpatient Encounter Prescriptions as of 05/27/2012  Medication Sig Dispense Refill  . amLODipine (NORVASC) 5 MG tablet Take 1 tablet (5 mg total) by mouth daily.  90 tablet  3  . atorvastatin (LIPITOR) 20 MG tablet Take 0.5 tablets (10 mg total) by mouth daily. Held as of 3/13      . Calcium 600-200 MG-UNIT per tablet Take 1 tablet by mouth as needed.       Marland Kitchen co-enzyme Q-10 30 MG capsule Take 30 mg by mouth 3 (three) times daily.      Marland Kitchen docusate sodium (COLACE) 100 MG capsule Take 100 mg by mouth daily.      . fluticasone (FLONASE) 50 MCG/ACT nasal spray Place 2 sprays into the nose daily.  48 g  3  . furosemide (LASIX) 20 MG tablet Take 1 tablet (20 mg total) by mouth daily.  90 tablet  3  . levothyroxine (SYNTHROID, LEVOTHROID) 50 MCG tablet Take 1 tablet (50 mcg total) by mouth daily.  90 tablet  3  . Multiple Vitamin (MULTIVITAMIN)  tablet Take 1 tablet by mouth daily.       No facility-administered encounter medications on file as of 05/27/2012.    Celebrex and Simvastatin  Past Medical History  Diagnosis Date  . Hypothyroidism 10/29/1995  . Hypertension 10/29/1995  . Hyperlipidemia 01/29/1991  . Melanoma     h/o, local excision. no chemo or rady.     Past Surgical History  Procedure Laterality Date  . Tonsillectomy  1954  . Hernia repair  04/25/2001  . Septoplasty  2006    Dr. Ernesto Rutherford  . Cataract surgery  2007, 2008    repair bilat  . Knee surgery      meniscus tear per Dr. Ronnie Derby, R knee    History   Social History  . Marital Status: Married    Spouse Name: N/A    Number of Children: 2  . Years of Education: N/A   Occupational History  . Retired     Technical brewer   Social History Main Topics  . Smoking status: Former Research scientist (life sciences)  . Smokeless tobacco: Never Used     Comment: quit 1994  . Alcohol Use: No  . Drug Use: No  . Sexually Active: Not on file   Other Topics Concern  . Not on file   Social History Narrative   Married 1957 lives with husband    Family History  Problem Relation Age  of Onset  . Stroke Mother   . Heart disease Mother     CHF  . Cancer Sister     stomach  . Cancer Brother     Metastatic cancer  . Colon cancer Neg Hx   . Breast cancer Neg Hx    ROS: General: no fevers/chills/night sweats Eyes: no blurry vision, diplopia, or amaurosis ENT: no sore throat or hearing loss Resp: no cough, wheezing, or hemoptysis CV: no edema or palpitations GI: no abdominal pain, nausea, vomiting, diarrhea, or constipation GU: no dysuria, frequency, or hematuria Skin: no rash Neuro: no headache, numbness, tingling, or weakness of extremities Musculoskeletal: no joint pain or swelling Heme: no bleeding, DVT, or easy bruising Endo: no polydipsia or polyuria  BP 184/110  Pulse 66  Ht 5' 5.5" (1.664 m)  Wt 95.709 kg (211 lb)  BMI 34.57 kg/m2  SpO2  98%  PHYSICAL EXAM: Pt is alert and oriented, WD, WN, in no distress. HEENT: normal Neck: JVP normal. Carotid upstrokes normal without bruits. No thyromegaly. Lungs: equal expansion, clear bilaterally CV: Apex is discrete and nondisplaced, RRR without murmur or gallop Abd: soft, NT, +BS, no bruit, no hepatosplenomegaly Back: no CVA tenderness Ext: Trace pretibial edema bilaterally        Femoral pulses 2+= without bruits        DP/PT pulses intact and = Skin: warm and dry without rash Neuro: CNII-XII intact             Strength intact = bilaterally  EKG:  Normal sinus rhythm 66 beats per minute, left anterior fascicular block, moderate voltage criteria for LVH maybe normal variant. There is a nonspecific T wave abnormality present.  ASSESSMENT AND PLAN: 1. Malignant hypertension. The patient's blood pressure on my recheck is 170/105. I suspect she has essential hypertension and simply requires more intensive antihypertensive therapy. I don't think she needs to be worked up for secondary causes of hypertension at this point. I have recommended discontinuation of furosemide and initiation of losartan/hydrochlorothiazide. Will start a low dose of 50/12.5 mg daily. She should continue on amlodipine. She understands now the this may be causing her mild leg edema. I will see her back in one month for followup. Will make sure that a metabolic panel was checked with her upcoming lab work on March 19.  2. Chest pain with typical and atypical features. Recommend a Lexiscan Myoview stress nuclear study to rule out significant ischemia in this patient who has multiple risk factors for cardiac disease. Considering her risk factors, it would be prudent that she start aspirin 81 mg daily.  Sherren Mocha 05/27/2012 11:11 PM

## 2012-05-30 ENCOUNTER — Other Ambulatory Visit: Payer: Self-pay

## 2012-06-07 ENCOUNTER — Ambulatory Visit (HOSPITAL_COMMUNITY): Payer: Medicare Other | Attending: Cardiology | Admitting: Radiology

## 2012-06-07 VITALS — BP 198/107 | HR 67 | Ht 65.5 in | Wt 207.0 lb

## 2012-06-07 DIAGNOSIS — R0602 Shortness of breath: Secondary | ICD-10-CM

## 2012-06-07 DIAGNOSIS — I10 Essential (primary) hypertension: Secondary | ICD-10-CM

## 2012-06-07 DIAGNOSIS — R079 Chest pain, unspecified: Secondary | ICD-10-CM

## 2012-06-07 MED ORDER — REGADENOSON 0.4 MG/5ML IV SOLN
0.4000 mg | Freq: Once | INTRAVENOUS | Status: AC
  Administered 2012-06-07: 0.4 mg via INTRAVENOUS

## 2012-06-07 MED ORDER — TECHNETIUM TC 99M SESTAMIBI GENERIC - CARDIOLITE
32.8000 | Freq: Once | INTRAVENOUS | Status: AC | PRN
  Administered 2012-06-07: 32.8 via INTRAVENOUS

## 2012-06-07 MED ORDER — TECHNETIUM TC 99M SESTAMIBI GENERIC - CARDIOLITE
11.0000 | Freq: Once | INTRAVENOUS | Status: AC | PRN
  Administered 2012-06-07: 11 via INTRAVENOUS

## 2012-06-07 NOTE — Progress Notes (Signed)
[facility] [address] [place], [place] [address] [phone]    Cardiology Nuclear Med Study  Lisa [name] is a 79 y.o. female     MRN : [id number]     DOB: 31-Dec-1932  Procedure Date: 06/07/2012  Nuclear Med Background Indication for Stress Test:  Evaluation for Ischemia  History:  No Previously Documented CAD Cardiac Risk Factors: Family History - CAD, Hypertension, Lipids and Obesity  Symptoms:  Chest Pressure/Tightness (last episode of chest discomfort was about a month ago), Diaphoresis, DOE, Palpitations and Rapid HR   Nuclear Pre-Procedure Caffeine/Decaff Intake:  None NPO After: 8:00pm   Lungs:  Clear room air. IV 0.9% NS with Angio Cath:  20g  IV Site: R Antecubital  IV Started by:  Perrin Maltese, EMT-P  Chest Size (in):  38 Cup Size: D  Height: 5' 5.5" (1.664 m)  Weight:  207 lb (93.895 kg)  BMI:  Body mass index is 33.91 kg/(m^2). Tech Comments:  Rx this am    Nuclear Med Study 1 or 2 day study: 1 day  Stress Test Type:  Treadmill/Lexiscan  Reading MD: Dola Argyle, MD  Order Authorizing Provider:  Sherren Mocha, MD  Resting Radionuclide: Technetium 89m Sestamibi  Resting Radionuclide Dose: 11.0 mCi   Stress Radionuclide:  Technetium 6m Sestamibi  Stress Radionuclide Dose: 32.8 mCi           Stress Protocol Rest HR: 67 Stress HR: 115  Rest BP: 197/107 Stress BP: 200/96  Exercise Time (min): 1:30 METS: n/a   Predicted Max HR: 143 bpm % Max HR: 80.42 bpm Rate Pressure Product: 23000   Dose of Adenosine (mg):  n/a Dose of Lexiscan: 0.4 mg  Dose of Atropine (mg): n/a Dose of Dobutamine: n/a mcg/kg/min (at max HR)  Stress Test Technologist: Letta Moynahan, CMA-N  Nuclear Technologist:  Charlton Amor, CNMT     Rest Procedure:  Myocardial perfusion imaging was performed at rest 45 minutes following the intravenous administration of Technetium 52m Sestamibi.  Rest ECG: Normal sinus rhythm. Incomplete right bundle  branch block.  Stress Procedure:  The patient received IV Lexiscan 0.4 mg over 15-seconds with concurrent low level exercise and then Technetium 54m Sestamibi was injected at 30-seconds while the patient continued walking one more minute.  Quantitative spect images were obtained after a 45-minute delay.  Stress ECG: No significant change from baseline ECG  QPS Raw Data Images:  Patient motion noted; appropriate software correction applied. Stress Images:  There is a small area of mild decrease photon activity in the apical lateral segment and the apical cap.  Rest Images:  Small area of mild decreased activity in the apical lateral segment and the apical cap. Subtraction (SDS):  No evidence of ischemia. Transient Ischemic Dilatation (Normal <1.22):  0.99 Lung/Heart Ratio (Normal <0.45):  0.31  Quantitative Gated Spect Images QGS EDV:  124 ml QGS ESV:  56 ml  Impression Exercise Capacity:  Lexiscan with low level exercise. BP Response:  Normal blood pressure response. Clinical Symptoms:  shortness of breath ECG Impression:  No significant ST segment change suggestive of ischemia. Comparison with Prior Nuclear Study: No previous nuclear study performed  Overall Impression:  Normal stress nuclear scan. There is mild decreased activity at the apex most consistent with apical thinning. There is interference from nuclear activity from structures below the diaphragm. This does not affect the ability to read the study.  LV Ejection Fraction: 55%.  LV Wall Motion:  NL  LV Function; NL Wall Motion

## 2012-06-15 ENCOUNTER — Other Ambulatory Visit (INDEPENDENT_AMBULATORY_CARE_PROVIDER_SITE_OTHER): Payer: Medicare Other

## 2012-06-15 DIAGNOSIS — E7800 Pure hypercholesterolemia, unspecified: Secondary | ICD-10-CM

## 2012-06-15 DIAGNOSIS — E039 Hypothyroidism, unspecified: Secondary | ICD-10-CM

## 2012-06-15 LAB — COMPREHENSIVE METABOLIC PANEL
ALT: 19 U/L (ref 0–35)
AST: 24 U/L (ref 0–37)
Albumin: 3.9 g/dL (ref 3.5–5.2)
Alkaline Phosphatase: 53 U/L (ref 39–117)
BUN: 21 mg/dL (ref 6–23)
CO2: 28 mEq/L (ref 19–32)
Calcium: 9.4 mg/dL (ref 8.4–10.5)
Chloride: 104 mEq/L (ref 96–112)
Creatinine, Ser: 1.1 mg/dL (ref 0.4–1.2)
GFR: 49.51 mL/min — ABNORMAL LOW (ref >60.00)
Glucose, Bld: 88 mg/dL (ref 70–99)
Potassium: 4.2 mEq/L (ref 3.5–5.1)
Sodium: 140 mEq/L (ref 135–145)
Total Bilirubin: 0.9 mg/dL (ref 0.3–1.2)
Total Protein: 6.7 g/dL (ref 6.0–8.3)

## 2012-06-15 LAB — TSH: TSH: 2.78 u[IU]/mL (ref 0.35–5.50)

## 2012-06-15 LAB — LIPID PANEL
Cholesterol: 168 mg/dL (ref 0–200)
HDL: 39.7 mg/dL (ref >39.00)
LDL Cholesterol: 92 mg/dL (ref 0–99)
Total CHOL/HDL Ratio: 4
Triglycerides: 181 mg/dL — ABNORMAL HIGH (ref 0.0–149.0)
VLDL: 36.2 mg/dL (ref 0.0–40.0)

## 2012-07-08 ENCOUNTER — Encounter: Payer: Self-pay | Admitting: Cardiovascular Disease

## 2012-07-08 ENCOUNTER — Ambulatory Visit (INDEPENDENT_AMBULATORY_CARE_PROVIDER_SITE_OTHER): Payer: Medicare Other | Admitting: Cardiovascular Disease

## 2012-07-08 VITALS — BP 162/88 | HR 64 | Ht 65.5 in | Wt 214.4 lb

## 2012-07-08 DIAGNOSIS — I10 Essential (primary) hypertension: Secondary | ICD-10-CM

## 2012-07-08 MED ORDER — LOSARTAN POTASSIUM-HCTZ 100-25 MG PO TABS: 1.0000 | ORAL_TABLET | Freq: Every day | ORAL | Status: DC

## 2012-07-08 NOTE — Patient Instructions (Addendum)
Your physician has recommended you make the following change in your medication: INCREASE Losartan HCT to 100/25mg  take one by mouth daily  Your physician has requested that you regularly monitor and record your blood pressure readings at home. Please use the same machine at the same time of day to check your readings and record them to bring to your follow-up visit. You can call the office in 1-2 months to let us know how the BP is running.   Your physician wants you to follow-up in: 6 MONTHS with Dr Burt Knack. You will receive a reminder letter in the mail two months in advance. If you don't receive a letter, please call our office to schedule the follow-up appointment.

## 2012-07-08 NOTE — Progress Notes (Signed)
HPI:  79 year old woman presenting for followup evaluation. She was seen in February for initial evaluation of hypertension chest pain. At that time she complained of substernal chest discomfort when using her arms. Her antihypertensive medications were adjusted and 8 nuclear stress scan was arranged to evaluate for myocardial ischemia. Her stress study was normal. She returns today for followup of her malignant hypertension.  Overall she is doing well. Her leg edema has improved. She brings in home blood pressure readings in a range from 140s to 180s / 80-105. She denies headache, palpitations, or dyspnea.  Outpatient Encounter Prescriptions as of 07/08/2012  Medication Sig Dispense Refill  . amLODipine (NORVASC) 5 MG tablet Take 1 tablet (5 mg total) by mouth daily.  90 tablet  3  . aspirin EC 81 MG tablet Take 1 tablet (81 mg total) by mouth daily.  1 tablet  0  . atorvastatin (LIPITOR) 20 MG tablet Take 0.5 tablets (10 mg total) by mouth daily. Held as of 3/13      . Calcium 600-200 MG-UNIT per tablet Take 1 tablet by mouth as needed.       Marland Kitchen co-enzyme Q-10 30 MG capsule Take 30 mg by mouth 3 (three) times daily.      Marland Kitchen docusate sodium (COLACE) 100 MG capsule Take 100 mg by mouth daily.      . fluticasone (FLONASE) 50 MCG/ACT nasal spray Place 2 sprays into the nose daily.  48 g  3  . levothyroxine (SYNTHROID, LEVOTHROID) 50 MCG tablet Take 1 tablet (50 mcg total) by mouth daily.  90 tablet  3  . losartan-hydrochlorothiazide (HYZAAR) 50-12.5 MG per tablet Take 1 tablet by mouth daily.  30 tablet  6  . Multiple Vitamin (MULTIVITAMIN) tablet Take 1 tablet by mouth daily.       No facility-administered encounter medications on file as of 07/08/2012.    Allergies  Allergen Reactions  . Celebrex (Celecoxib) Swelling    And rash  . Simvastatin     Myalgias    Past Medical History  Diagnosis Date  . Hypothyroidism 10/29/1995  . Hypertension 10/29/1995  . Hyperlipidemia 01/29/1991  .  Melanoma     h/o, local excision. no chemo or rady.     ROS: Negative except as per HPI  BP 162/88  Pulse 64  Ht 5' 5.5" (1.664 m)  Wt 97.251 kg (214 lb 6.4 oz)  BMI 35.12 kg/m2  PHYSICAL EXAM: Pt is alert and oriented, NAD HEENT: normal Neck: JVP - normal, carotids 2+= without bruits Lungs: CTA bilaterally CV: RRR without murmur or gallop Abd: soft, NT, Positive BS, no hepatomegaly Ext: no C/C/E, distal pulses intact and equal Skin: warm/dry no rash  Myoview Scan: Impression  Exercise Capacity: Lexiscan with low level exercise.  BP Response: Normal blood pressure response.  Clinical Symptoms: shortness of breath  ECG Impression: No significant ST segment change suggestive of ischemia.  Comparison with Prior Nuclear Study: No previous nuclear study performed  Overall Impression: Normal stress nuclear scan. There is mild decreased activity at the apex most consistent with apical thinning. There is interference from nuclear activity from structures below the diaphragm. This does not affect the ability to read the study.  LV Ejection Fraction: 55%. LV Wall Motion: NL LV Function; NL Wall Motion   ASSESSMENT AND PLAN: 1. Hypertension, suboptimal control. Recommend increase losartan/hydrochlorothiazide to 100/25 mg daily. She will call in with blood pressures in the next month or 2. I will see her back for  followup in 6 months. She should continue on amlodipine 5 mg.  2. Chest pain. Nuclear stress scan is normal. Pain occurs only with use of her arms and I suspect is musculoskeletal.  3. Hyperlipidemia. She is tolerating a low dose of atorvastatin.  Sherren Mocha 07/08/2012 2:11 PM

## 2012-07-18 ENCOUNTER — Telehealth: Payer: Self-pay | Admitting: *Deleted

## 2012-07-18 MED ORDER — ATORVASTATIN CALCIUM 20 MG PO TABS: 10.0000 mg | ORAL_TABLET | Freq: Every day | ORAL | Status: DC

## 2012-07-18 NOTE — Telephone Encounter (Signed)
Faxed refill request.  Notation is made that this medication is being held.  Please advise.

## 2012-07-18 NOTE — Telephone Encounter (Signed)
That is an old notation.  rx sent.

## 2012-07-21 ENCOUNTER — Other Ambulatory Visit: Payer: Self-pay | Admitting: Family Medicine

## 2012-07-21 MED ORDER — ATORVASTATIN CALCIUM 20 MG PO TABS: 10.0000 mg | ORAL_TABLET | Freq: Every day | ORAL | Status: DC

## 2012-08-05 ENCOUNTER — Other Ambulatory Visit: Payer: Self-pay | Admitting: Physician Assistant

## 2012-08-08 NOTE — Telephone Encounter (Signed)
Pt request status of atorvastatin refill; pt notified was sent electronically on 07/21/12. Pt will ck with pharmacy.

## 2012-08-31 ENCOUNTER — Telehealth: Payer: Self-pay | Admitting: Cardiovascular Disease

## 2012-08-31 NOTE — Telephone Encounter (Signed)
I spoke with the pt and she called into the office with her BP readings for Dr Burt Knack to review:  4/23 156/81 4/25 186/87 4/30 179/88 5/2 141/76 5/3 149/93 5/7 144/83 5/14 148/84 5/19 136/81 5/22 140/86 6/3 140/76 Pulse runs in the 59s

## 2012-08-31 NOTE — Telephone Encounter (Signed)
New problem    Pt calling about b/p

## 2012-09-07 NOTE — Telephone Encounter (Signed)
Blood pressure over the last month has been in acceptable range. We'll continue her current medical program.

## 2012-09-08 NOTE — Telephone Encounter (Signed)
Pt aware no changes at this time.

## 2012-11-03 ENCOUNTER — Encounter: Payer: Self-pay | Admitting: Cardiovascular Disease

## 2012-11-03 ENCOUNTER — Telehealth: Payer: Self-pay | Admitting: Cardiovascular Disease

## 2012-11-17 ENCOUNTER — Encounter (HOSPITAL_COMMUNITY): Payer: Self-pay | Admitting: Emergency Medicine

## 2012-11-17 ENCOUNTER — Telehealth: Payer: Self-pay | Admitting: Family Medicine

## 2012-11-17 ENCOUNTER — Emergency Department (HOSPITAL_COMMUNITY)
Admission: EM | Admit: 2012-11-17 | Discharge: 2012-11-18 | Disposition: A | Discharge status: Home / Self Care | Payer: Medicare Other | Attending: Emergency Medicine | Admitting: Emergency Medicine

## 2012-11-17 DIAGNOSIS — Z87891 Personal history of nicotine dependence: Secondary | ICD-10-CM | POA: Insufficient documentation

## 2012-11-17 DIAGNOSIS — IMO0002 Reserved for concepts with insufficient information to code with codable children: Secondary | ICD-10-CM | POA: Insufficient documentation

## 2012-11-17 DIAGNOSIS — Y9289 Other specified places as the place of occurrence of the external cause: Secondary | ICD-10-CM | POA: Insufficient documentation

## 2012-11-17 DIAGNOSIS — Z85820 Personal history of malignant melanoma of skin: Secondary | ICD-10-CM | POA: Insufficient documentation

## 2012-11-17 DIAGNOSIS — Z79899 Other long term (current) drug therapy: Secondary | ICD-10-CM | POA: Insufficient documentation

## 2012-11-17 DIAGNOSIS — I10 Essential (primary) hypertension: Secondary | ICD-10-CM | POA: Insufficient documentation

## 2012-11-17 DIAGNOSIS — Y9389 Activity, other specified: Secondary | ICD-10-CM | POA: Insufficient documentation

## 2012-11-17 DIAGNOSIS — E039 Hypothyroidism, unspecified: Secondary | ICD-10-CM | POA: Insufficient documentation

## 2012-11-17 DIAGNOSIS — Z9889 Other specified postprocedural states: Secondary | ICD-10-CM | POA: Insufficient documentation

## 2012-11-17 DIAGNOSIS — E785 Hyperlipidemia, unspecified: Secondary | ICD-10-CM | POA: Insufficient documentation

## 2012-11-17 DIAGNOSIS — S8990XA Unspecified injury of unspecified lower leg, initial encounter: Secondary | ICD-10-CM | POA: Insufficient documentation

## 2012-11-17 DIAGNOSIS — Z7982 Long term (current) use of aspirin: Secondary | ICD-10-CM | POA: Insufficient documentation

## 2012-11-17 DIAGNOSIS — M7989 Other specified soft tissue disorders: Secondary | ICD-10-CM

## 2012-11-17 DIAGNOSIS — R21 Rash and other nonspecific skin eruption: Secondary | ICD-10-CM | POA: Insufficient documentation

## 2012-11-17 LAB — BASIC METABOLIC PANEL
BUN: 26 mg/dL — ABNORMAL HIGH (ref 6–23)
CO2: 27 mEq/L (ref 19–32)
Calcium: 9.4 mg/dL (ref 8.4–10.5)
Chloride: 97 mEq/L (ref 96–112)
Creatinine, Ser: 1.48 mg/dL — ABNORMAL HIGH (ref 0.50–1.10)
GFR calc Af Amer: 38 mL/min — ABNORMAL LOW (ref >90)
GFR calc non Af Amer: 33 mL/min — ABNORMAL LOW (ref >90)
Glucose, Bld: 100 mg/dL — ABNORMAL HIGH (ref 70–99)
Potassium: 3.4 mEq/L — ABNORMAL LOW (ref 3.5–5.1)
Sodium: 134 mEq/L — ABNORMAL LOW (ref 135–145)

## 2012-11-17 LAB — CBC WITH DIFFERENTIAL/PLATELET
Basophils Absolute: 0 10*3/uL (ref 0.0–0.1)
Basophils Relative: 0 % (ref 0–1)
Eosinophils Absolute: 0.1 10*3/uL (ref 0.0–0.7)
Eosinophils Relative: 1 % (ref 0–5)
HCT: 37.4 % (ref 36.0–46.0)
Hemoglobin: 12.8 g/dL (ref 12.0–15.0)
Lymphocytes Relative: 25 % (ref 12–46)
Lymphs Abs: 1.6 10*3/uL (ref 0.7–4.0)
MCH: 29.8 pg (ref 26.0–34.0)
MCHC: 34.2 g/dL (ref 30.0–36.0)
MCV: 87 fL (ref 78.0–100.0)
Monocytes Absolute: 0.7 10*3/uL (ref 0.1–1.0)
Monocytes Relative: 11 % (ref 3–12)
Neutro Abs: 4 10*3/uL (ref 1.7–7.7)
Neutrophils Relative %: 64 % (ref 43–77)
Platelets: 136 10*3/uL — ABNORMAL LOW (ref 150–400)
RBC: 4.3 MIL/uL (ref 3.87–5.11)
RDW: 13.8 % (ref 11.5–15.5)
WBC: 6.3 10*3/uL (ref 4.0–10.5)

## 2012-11-17 MED ORDER — ENOXAPARIN SODIUM 100 MG/ML ~~LOC~~ SOLN
100.0000 mg | Freq: Once | SUBCUTANEOUS | Status: AC
  Administered 2012-11-17: 100 mg via SUBCUTANEOUS
  Filled 2012-11-17: qty 1

## 2012-11-17 MED ORDER — CEPHALEXIN 250 MG PO CAPS
500.0000 mg | ORAL_CAPSULE | Freq: Once | ORAL | Status: AC
  Administered 2012-11-17: 500 mg via ORAL
  Filled 2012-11-17: qty 2

## 2012-11-17 MED ORDER — ENOXAPARIN SODIUM 100 MG/ML ~~LOC~~ SOLN: 100.0000 mg | Freq: Two times a day (BID) | SUBCUTANEOUS | Status: DC

## 2012-11-17 NOTE — Telephone Encounter (Signed)
Patient advised.

## 2012-11-17 NOTE — Telephone Encounter (Signed)
Caller Name: Isaac  Phone: [phone]  Patient: [name]  Gender: Female  DOB: [birth year]  Age: 79 Years  PCP: Elsie Stain Brigitte Pulse) [facility])   Does the office need to follow up with this patient?: Yes  Instructions For The Office: recommendations  RN Note:  called for appt,none at this office, or [facility] or [place] (where she agrees to come)    Reason For Call & Symptoms: left leg from knee to ankle, hit area 2+ inches below knee back of leg  on bench, skin not broken, not bruised, now hot to touch from ankle to knee, rash around the area, whole area is red.  Reviewed Health History In EMR: Yes  Reviewed Medications In EMR: Yes  Reviewed Allergies In EMR: Yes  Reviewed Surgeries / Procedures: Yes  Date of Onset of Symptoms: 11/15/2012  Guideline(s) Used:  Leg Injury  Disposition Per Guideline:  Go to Office Now  Reason For Disposition Reached:  Looks infected (e.g., spreading redness, pus, red streak)  Advice Given:  Call Back If:  You become worse.  Patient Will Follow Care Advice:  YES

## 2012-11-17 NOTE — ED Notes (Signed)
PT. REPORTS LEFT LOWER LEG REDDNESS / SWELLING ONSET 2 DAYS AGO , HIT AGAINST A FURNITURE , SEEN AT AN URGENT CARE ( [facility])  TODAY SENT HERE FOR FURTHER EVALUATION , DENIES FEVER / AMBULATORY.

## 2012-11-17 NOTE — ED Provider Notes (Signed)
CSN: [id number]     Arrival date & time 11/17/12  2026 History     First MD Initiated Contact with Patient 11/17/12 2221     Chief Complaint  Patient presents with  . Leg Swelling   (Consider location/radiation/quality/duration/timing/severity/associated sxs/prior Treatment) HPI Comments: Patient presents with complaint of left lower extremity swelling and redness began yesterday. Patient was seen at an outside urgent care and sent to the emergency department for evaluation. Redness began generalized. The only injury to the area was when she struck her shin on a piece of furniture prior. She denies fever, nausea, vomiting. She does not have a history of diabetes or other immune compromising states. No treatments prior to arrival. Area is tender with palpation. No history of blood clots. Onset of symptoms acute. Course is gradually worsening. Nothing makes symptoms better or worse.  The history is provided by the patient.    Past Medical History  Diagnosis Date  . Hypothyroidism 10/29/1995  . Hypertension 10/29/1995  . Hyperlipidemia 01/29/1991  . Melanoma     h/o, local excision. no chemo or rady.    Past Surgical History  Procedure Laterality Date  . Tonsillectomy  1954  . Hernia repair  04/25/2001  . Septoplasty  2006    Dr. Ernesto Rutherford  . Cataract surgery  2007, 2008    repair bilat  . Knee surgery      meniscus tear per Dr. Ronnie Derby, R knee   Family History  Problem Relation Age of Onset  . Stroke Mother   . Heart disease Mother     CHF  . Cancer Sister     stomach  . Cancer Brother     Metastatic cancer  . Colon cancer Neg Hx   . Breast cancer Neg Hx    History  Substance Use Topics  . Smoking status: Former Research scientist (life sciences)  . Smokeless tobacco: Never Used     Comment: quit 1994  . Alcohol Use: No   OB History   Grav Para Term Preterm Abortions TAB SAB Ect Mult Living                 Review of Systems  Constitutional: Negative for fever.  HENT: Negative for sore  throat and rhinorrhea.   Eyes: Negative for redness.  Respiratory: Negative for cough.   Cardiovascular: Negative for chest pain.  Gastrointestinal: Negative for nausea, vomiting, abdominal pain and diarrhea.  Genitourinary: Negative for dysuria.  Musculoskeletal: Positive for myalgias.  Skin: Positive for color change and rash.  Neurological: Negative for headaches.    Allergies  Celebrex and Simvastatin  Home Medications   Current Outpatient Rx  Name  Route  Sig  Dispense  Refill  . amLODipine (NORVASC) 5 MG tablet   Oral   Take 1 tablet (5 mg total) by mouth daily.   90 tablet   3   . aspirin EC 81 MG tablet   Oral   Take 1 tablet (81 mg total) by mouth daily.   1 tablet   0   . atorvastatin (LIPITOR) 20 MG tablet   Oral   Take 0.5 tablets (10 mg total) by mouth daily.   45 tablet   3   . Calcium 600-200 MG-UNIT per tablet   Oral   Take 1 tablet by mouth as needed.          Marland Kitchen co-enzyme Q-10 30 MG capsule   Oral   Take 90 mg by mouth daily.          Marland Kitchen  docusate sodium (COLACE) 100 MG capsule   Oral   Take 100 mg by mouth daily.         . fluticasone (FLONASE) 50 MCG/ACT nasal spray   Nasal   Place 2 sprays into the nose daily as needed for rhinitis.         Marland Kitchen levothyroxine (SYNTHROID, LEVOTHROID) 50 MCG tablet   Oral   Take 1 tablet (50 mcg total) by mouth daily.   90 tablet   3   . losartan-hydrochlorothiazide (HYZAAR) 100-25 MG per tablet   Oral   Take 1 tablet by mouth daily.   30 tablet   3   . Multiple Vitamin (MULTIVITAMIN) tablet   Oral   Take 1 tablet by mouth daily.          BP 176/84  Pulse 68  Temp(Src) 98.2 F (36.8 C) (Oral)  Resp 14  SpO2 97% Physical Exam  Nursing note and vitals reviewed. Constitutional: She appears well-developed and well-nourished.  HENT:  Head: Normocephalic and atraumatic.  Eyes: Conjunctivae are normal. Right eye exhibits no discharge. Left eye exhibits no discharge.  Neck: Normal range  of motion. Neck supple.  Cardiovascular: Normal rate, regular rhythm and normal heart sounds.   Pulmonary/Chest: Effort normal and breath sounds normal.  Abdominal: Soft. There is no tenderness.  Neurological: She is alert.  Skin: Skin is warm and dry. There is erythema.  Generalized erythema and warmth of left anterior and posterior lower extremity extending inferior to knee. Minimal tenderness.   Psychiatric: She has a normal mood and affect.    ED Course   Procedures (including critical care time)  Labs Reviewed  CBC WITH DIFFERENTIAL - Abnormal; Notable for the following:    Platelets 136 (*)    All other components within normal limits  BASIC METABOLIC PANEL - Abnormal; Notable for the following:    Sodium 134 (*)    Potassium 3.4 (*)    Glucose, Bld 100 (*)    BUN 26 (*)    Creatinine, Ser 1.48 (*)    GFR calc non Af Amer 33 (*)    GFR calc Af Amer 38 (*)    All other components within normal limits   No results found. 1. Leg swelling     10:29 PM Patient seen and examined. Work-up initiated. Medications ordered.   Vital signs reviewed and are as follows: Filed Vitals:   11/17/12 2037  BP: 176/84  Pulse: 68  Temp: 98.2 F (36.8 C)  Resp: 14   D/w Dr. Wilson Singer who has seen. Will give Lovenox prophylaxis. Keflex given here. Outpatient [place] ordered for morning. Patient encouraged to f/u with PCP tomorrow.   Pt urged to return with worsening pain, worsening swelling, expanding area of redness or streaking up extremity, fever, or any other concerns. Urged to take complete course of antibiotics as prescribed. Pt verbalizes understanding and agrees with plan.   MDM  Patient with LE redness and swelling. Leading ddx is cellulitis however DVT needs to be ruled out. As doppler US not available tonight, patient treated with lovenox and outpt [place] scheduled for tomorrow morning. Abx started in ED. WBC normal. Patient appears well, non-toxic. She has PCP f/u. Feel she is appropriate  candidate for outpatient treatment.   Carlisle Cater, PA-C 11/18/12 [phone]

## 2012-11-17 NOTE — Progress Notes (Signed)
ANTICOAGULATION CONSULT NOTE - Initial Consult  Pharmacy Consult for Lovenox Indication: r/o DVT  Allergies  Allergen Reactions  . Celebrex [Celecoxib] Swelling    And rash  . Simvastatin     Myalgias    Patient Measurements: Height: 5\' 5"  (165.1 cm) Weight: 214 lb 8.1 oz (97.3 kg) IBW/kg (Calculated) : 57  Vital Signs: Temp: 98.2 F (36.8 C) (08/21 2037) Temp src: Oral (08/21 2037) BP: 176/84 mmHg (08/21 2037) Pulse Rate: 68 (08/21 2037)  Labs:  Recent Labs  11/17/12 2248  HGB 12.8  HCT 37.4  PLT 136*  CREATININE 1.48*    Estimated Creatinine Clearance: 36.2 ml/min (by C-G formula based on Cr of 1.48).   Medical History: Past Medical History  Diagnosis Date  . Hypothyroidism 10/29/1995  . Hypertension 10/29/1995  . Hyperlipidemia 01/29/1991  . Melanoma     h/o, local excision. no chemo or rady.     Assessment/Plan:  79yo female c/o LLE redness/swelling x2d since hitting against furniture, now awaiting [place] to r/o DVT.  Will start Lovenox 100mg  SQ Q12H.  Wynona Neat, PharmD, BCPS  11/17/2012,11:30 PM

## 2012-11-17 NOTE — ED Notes (Addendum)
Kohut MD at bedside.

## 2012-11-17 NOTE — Telephone Encounter (Signed)
Needs eval today/tonight, at UC if not at a [place] site.  Thanks.

## 2012-11-18 ENCOUNTER — Ambulatory Visit (HOSPITAL_COMMUNITY)
Admission: RE | Admit: 2012-11-18 | Discharge: 2012-11-18 | Disposition: A | Discharge status: Home / Self Care | Payer: Medicare Other | Source: Ambulatory Visit | Attending: Family Medicine | Admitting: Family Medicine

## 2012-11-18 DIAGNOSIS — L02419 Cutaneous abscess of limb, unspecified: Secondary | ICD-10-CM | POA: Insufficient documentation

## 2012-11-18 DIAGNOSIS — L539 Erythematous condition, unspecified: Secondary | ICD-10-CM

## 2012-11-18 MED ORDER — CEPHALEXIN 500 MG PO CAPS: 500.0000 mg | ORAL_CAPSULE | Freq: Four times a day (QID) | ORAL | Status: DC

## 2012-11-18 NOTE — Progress Notes (Signed)
*  PRELIMINARY RESULTS* Vascular Ultrasound Left lower extremity venous duplex has been completed.  Preliminary findings: Left = negative for DVT and baker's cyst.   Landry Mellow, RDMS, RVT  11/18/2012, 8:57 AM

## 2012-11-24 NOTE — ED Provider Notes (Signed)
Medical screening examination/treatment/procedure(s) were conducted as a shared visit with non-physician practitioner(s) and myself.  I personally evaluated the patient during the encounter.  Pt with atraumatic LLE erythema, swelling. Suspect cellulitis. Less likely DVT. Empiric lovenox until can obtain doppler tomorrow. Abx. Further care pending [place] results.   Virgel Manifold, MD 11/24/12 [phone]

## 2012-11-29 ENCOUNTER — Emergency Department: Payer: Self-pay | Admitting: Emergency Medicine

## 2012-12-09 ENCOUNTER — Telehealth: Payer: Self-pay

## 2012-12-09 ENCOUNTER — Emergency Department: Payer: Self-pay | Admitting: Emergency Medicine

## 2012-12-09 DIAGNOSIS — S81809A Unspecified open wound, unspecified lower leg, initial encounter: Secondary | ICD-10-CM

## 2012-12-09 NOTE — Telephone Encounter (Signed)
Pt cut her leg on a wire while mowing and working in flowers and had 16 stitches 10 days ago at ED at [facility]; pt went back today to have suture removal at ED [facility] and pt was told skin appeared dead and not healing well. Sutures were removed but pt said skin looks black and suture line appears raw. Pt has no fever. Pt was given updated tetanus shot and pt was restarted on Cephalexin 500 mg taking one tab 2 tabs daily.  Pt was advised by ED to contact PCP for referral to a surgeon or wound care center.Please advise. Advised pt if condition changes or worsens prior to cb to call our office or return to ED.Pt voiced understanding,. Faxed [place] for medical records.

## 2012-12-09 NOTE — Telephone Encounter (Signed)
pts daughter called back offering to email a picture of pts leg; advised we could not accept a picture of the leg but if pt wanted to be see Dr Silvio Pate has an available appt at 4 pm today. Pt got on phone and pt said she could not come to office today. Pt will go to ED if condition changes or worsens prior to referral. [facility] ED records are on Dr Josefine Class desk.

## 2012-12-10 NOTE — Telephone Encounter (Signed)
Refer to wound center.

## 2012-12-12 ENCOUNTER — Ambulatory Visit (INDEPENDENT_AMBULATORY_CARE_PROVIDER_SITE_OTHER): Payer: Medicare Other | Admitting: Family Medicine

## 2012-12-12 ENCOUNTER — Encounter: Payer: Self-pay | Admitting: Family Medicine

## 2012-12-12 VITALS — BP 148/84 | HR 69 | Temp 98.2°F | Wt 211.5 lb

## 2012-12-12 DIAGNOSIS — Z5189 Encounter for other specified aftercare: Secondary | ICD-10-CM

## 2012-12-12 DIAGNOSIS — S81802D Unspecified open wound, left lower leg, subsequent encounter: Secondary | ICD-10-CM

## 2012-12-12 NOTE — Telephone Encounter (Signed)
Will see today.  

## 2012-12-12 NOTE — Progress Notes (Signed)
L medial lower leg.  Injury prev sutured.  Seen at ER for suture removal.  Dtap done 11/30/12.  She was put on keflex when the sutures were removed.   Flap now removed- it came off on home bandage change.  She is doing BID bandage changes and using bacitracin BID.  No fevers.    Meds, vitals, and allergies reviewed.   ROS: See HPI.  Otherwise, noncontributory.  nad 4x4 cm wound.  Granulation tissue noted, doesn't appear infected.  She agrees that it looks improved. No spreading erythema.   Distally with normal DP pulse.

## 2012-12-12 NOTE — Telephone Encounter (Signed)
Pt left v/m she thinks she can tell improvement in her leg over weekend and wants Dr Damita Dunnings to look at before she has to see specialist; pt schedued appt today at 2:15 pm.

## 2012-12-12 NOTE — Patient Instructions (Addendum)
Finish the antibiotics and give me an update in a few days. If you have fever or more pain or pus draining, then let me know.  Keep using bacitracin and nonstick bandages and this should get better.  Take care.

## 2012-12-13 DIAGNOSIS — S81802A Unspecified open wound, left lower leg, initial encounter: Secondary | ICD-10-CM | POA: Insufficient documentation

## 2012-12-13 NOTE — Assessment & Plan Note (Signed)
Improved, doesn't appear infected.  Okay for outpatient f/u.  Cancer wound eval and she'll update me in a few days.  Continue routine dressing and care.

## 2012-12-16 ENCOUNTER — Telehealth: Payer: Self-pay

## 2012-12-16 NOTE — Telephone Encounter (Signed)
I wouldn't do anything else, continue as she currently is doing.  Thanks.

## 2012-12-16 NOTE — Telephone Encounter (Signed)
Patient advised.

## 2012-12-16 NOTE — Telephone Encounter (Signed)
Pt was seen 12/12/12; finished antibiotics on 12/14/12; pt said she is improving daily, dead skin has peeled off, slight drainage, no pain unless stays on feet for prolonged period of time. No fever. [facility].

## 2012-12-29 ENCOUNTER — Other Ambulatory Visit: Payer: Self-pay

## 2012-12-29 DIAGNOSIS — Z1231 Encounter for screening mammogram for malignant neoplasm of breast: Secondary | ICD-10-CM

## 2013-01-04 ENCOUNTER — Ambulatory Visit
Admission: RE | Admit: 2013-01-04 | Discharge: 2013-01-04 | Disposition: A | Discharge status: Home / Self Care | Payer: Medicare Other | Source: Ambulatory Visit

## 2013-01-04 DIAGNOSIS — Z1231 Encounter for screening mammogram for malignant neoplasm of breast: Secondary | ICD-10-CM

## 2013-01-05 ENCOUNTER — Ambulatory Visit (INDEPENDENT_AMBULATORY_CARE_PROVIDER_SITE_OTHER): Payer: Medicare Other

## 2013-01-05 ENCOUNTER — Ambulatory Visit: Payer: Medicare Other | Admitting: Cardiovascular Disease

## 2013-01-05 DIAGNOSIS — Z23 Encounter for immunization: Secondary | ICD-10-CM

## 2013-01-06 ENCOUNTER — Encounter: Payer: Self-pay | Admitting: *Deleted

## 2013-01-20 ENCOUNTER — Ambulatory Visit (INDEPENDENT_AMBULATORY_CARE_PROVIDER_SITE_OTHER): Payer: Medicare Other | Admitting: Cardiovascular Disease

## 2013-01-20 ENCOUNTER — Encounter: Payer: Self-pay | Admitting: Cardiovascular Disease

## 2013-01-20 VITALS — BP 144/96 | HR 64

## 2013-01-20 DIAGNOSIS — I10 Essential (primary) hypertension: Secondary | ICD-10-CM

## 2013-01-20 NOTE — Progress Notes (Signed)
HPI:  79 year old woman presenting for followup evaluation. The patient has been seen for hypertension and chest pain. A Myoview scan was performed earlier this year and demonstrated no ischemia and normal LV function. Blood pressure control has been challenging. When she was seen last her losartan/hydrochlorothiazide was increased.  The patient has had some recent problems with left leg injury. She developed cellulitis. She is improving from this. From a cardiac perspective she's had no problems. She denies chest pain, chest pressure, dyspnea, or palpitations. She has not been checking her blood pressure regularly but remembers a reading from about one month ago at the pharmacy when her systolic blood pressure was 138. She's compliant with medications.  Outpatient Encounter Prescriptions as of 01/20/2013  Medication Sig Dispense Refill  . amLODipine (NORVASC) 5 MG tablet Take 1 tablet (5 mg total) by mouth daily.  90 tablet  3  . aspirin EC 81 MG tablet Take 1 tablet (81 mg total) by mouth daily.  1 tablet  0  . atorvastatin (LIPITOR) 20 MG tablet Take 0.5 tablets (10 mg total) by mouth daily.  45 tablet  3  . Calcium 600-200 MG-UNIT per tablet Take 1 tablet by mouth as needed.       Marland Kitchen co-enzyme Q-10 30 MG capsule Take 90 mg by mouth daily.       Marland Kitchen docusate sodium (COLACE) 100 MG capsule Take 100 mg by mouth daily.      . fluticasone (FLONASE) 50 MCG/ACT nasal spray Place 2 sprays into the nose daily as needed for rhinitis.      Marland Kitchen levothyroxine (SYNTHROID, LEVOTHROID) 50 MCG tablet Take 1 tablet (50 mcg total) by mouth daily.  90 tablet  3  . losartan-hydrochlorothiazide (HYZAAR) 100-25 MG per tablet Take 1 tablet by mouth daily.  30 tablet  3  . Multiple Vitamin (MULTIVITAMIN) tablet Take 1 tablet by mouth daily.      . [DISCONTINUED] cephALEXin (KEFLEX) 500 MG capsule Take 500 mg by mouth 2 (two) times daily.       No facility-administered encounter medications on file as of 01/20/2013.     Allergies  Allergen Reactions  . Celebrex [Celecoxib] Swelling    And rash  . Simvastatin     Myalgias    Past Medical History  Diagnosis Date  . Hypothyroidism 10/29/1995  . Hypertension 10/29/1995  . Hyperlipidemia 01/29/1991  . Melanoma     h/o, local excision. no chemo or rady.     ROS: Negative except as per HPI  BP 144/96  Pulse 64  PHYSICAL EXAM: Pt is alert and oriented, NAD HEENT: normal Neck: JVP - normal, carotids 2+= without bruits Lungs: CTA bilaterally CV: RRR without murmur or gallop Abd: soft, NT, Positive BS, no hepatomegaly Ext: no C/C/E, distal pulses intact and equal Skin: warm/dry no rash  EKG:  Normal sinus rhythm 66 beats per minute. First degree AV block with PACs with aberrant conduction. Age-indeterminate septal infarct.  Myoview Scan 06/07/2012:  Impression  Exercise Capacity: Lexiscan with low level exercise.  BP Response: Normal blood pressure response.  Clinical Symptoms: shortness of breath  ECG Impression: No significant ST segment change suggestive of ischemia.  Comparison with Prior Nuclear Study: No previous nuclear study performed  Overall Impression: Normal stress nuclear scan. There is mild decreased activity at the apex most consistent with apical thinning. There is interference from nuclear activity from structures below the diaphragm. This does not affect the ability to read the study.  LV  Ejection Fraction: 55%. LV Wall Motion: NL LV Function; NL Wall Motion   ASSESSMENT AND PLAN: 1. Malignant hypertension. The patient is currently managed with amlodipine, losartan, and hydrochlorothiazide. Office blood pressure readings are elevated. I repeated her blood pressure and got 150/100. Home readings are lower by her report although she checks it infrequently. I asked her check her blood pressure at least twice per week and calls back in 2 weeks with readings. Would consider increasing amlodipine from 5-10 mg. However, the patient  has had some problems with leg swelling and I'm concerned this might exacerbate that. Will see her back in one year for followup. No medication changes were made today. We will wait to hear from her in 2 weeks. Low sodium diet was reviewed.  2. Hyperlipidemia. The patient takes atorvastatin and is followed by Dr. Damita Dunnings.  For followup I will see her back in one year.

## 2013-01-20 NOTE — Patient Instructions (Addendum)
Your physician has requested that you regularly monitor and record your blood pressure readings at home. Please use the same machine at the same time of day to check your readings and record them to bring to your follow-up visit. Please call the office in 2 WEEKS with your BP readings [phone]).  Your physician recommends that you continue on your current medications as directed. Please refer to the Current Medication list given to you today.  Your physician wants you to follow-up in: 1 YEAR with Dr Burt Knack.  You will receive a reminder letter in the mail two months in advance. If you don't receive a letter, please call our office to schedule the follow-up appointment.   2 Gram Low Sodium Diet A 2 gram sodium diet restricts the amount of sodium in the diet to no more than 2 g or 2000 mg daily. Limiting the amount of sodium is often used to help lower blood pressure. It is important if you have heart, liver, or kidney problems. Many foods contain sodium for flavor and sometimes as a preservative. When the amount of sodium in a diet needs to be low, it is important to know what to look for when choosing foods and drinks. The following includes some information and guidelines to help make it easier for you to adapt to a low sodium diet. QUICK TIPS  Do not add salt to food.  Avoid convenience items and fast food.  Choose unsalted snack foods.  Buy lower sodium products, often labeled as "lower sodium" or "no salt added."  Check food labels to learn how much sodium is in 1 serving.  When eating at a restaurant, ask that your food be prepared with less salt or none, if possible. READING FOOD LABELS FOR SODIUM INFORMATION The nutrition facts label is a good place to find how much sodium is in foods. Look for products with no more than 500 to 600 mg of sodium per meal and no more than 150 mg per serving. Remember that 2 g = 2000 mg. The food label may also list foods as:  Sodium-free: Less than 5  mg in a serving.  Very low sodium: 35 mg or less in a serving.  Low-sodium: 140 mg or less in a serving.  Light in sodium: 50% less sodium in a serving. For example, if a food that usually has 300 mg of sodium is changed to become light in sodium, it will have 150 mg of sodium.  Reduced sodium: 25% less sodium in a serving. For example, if a food that usually has 400 mg of sodium is changed to reduced sodium, it will have 300 mg of sodium. CHOOSING FOODS Grains  Avoid: Salted crackers and snack items. Some cereals, including instant hot cereals. Bread stuffing and biscuit mixes. Seasoned rice or pasta mixes.  Choose: Unsalted snack items. Low-sodium cereals, oats, puffed wheat and rice, shredded wheat. English muffins and bread. Pasta. Meats  Avoid: Salted, canned, smoked, spiced, pickled meats, including fish and poultry. Bacon, ham, sausage, cold cuts, hot dogs, anchovies.  Choose: Low-sodium canned tuna and salmon. Fresh or frozen meat, poultry, and fish. Dairy  Avoid: Processed cheese and spreads. Cottage cheese. Buttermilk and condensed milk. Regular cheese.  Choose: Milk. Low-sodium cottage cheese. Yogurt. Sour cream. Low-sodium cheese. Fruits and Vegetables  Avoid: Regular canned vegetables. Regular canned tomato sauce and paste. Frozen vegetables in sauces. Olives. Angie Fava. Relishes. Sauerkraut.  Choose: Low-sodium canned vegetables. Low-sodium tomato sauce and paste. Frozen or fresh vegetables. Fresh and  frozen fruit. Condiments  Avoid: Canned and packaged gravies. Worcestershire sauce. Tartar sauce. Barbecue sauce. Soy sauce. Steak sauce. Ketchup. Onion, garlic, and table salt. Meat flavorings and tenderizers.  Choose: Fresh and dried herbs and spices. Low-sodium varieties of mustard and ketchup. Lemon juice. Tabasco sauce. Horseradish. SAMPLE 2 GRAM SODIUM MEAL PLAN Breakfast / Sodium (mg)  1 cup low-fat milk / [id number] mg  2 slices whole-wheat toast / 270 mg  1 tbs  heart-healthy margarine / 153 mg  1 hard-boiled egg / 139 mg  1 small orange / 0 mg Lunch / Sodium (mg)  1 cup raw carrots / 76 mg   cup hummus / 298 mg  1 cup low-fat milk / 143 mg   cup red grapes / 2 mg  1 whole-wheat pita bread / 356 mg Dinner / Sodium (mg)  1 cup whole-wheat pasta / 2 mg  1 cup low-sodium tomato sauce / 73 mg  3 oz lean ground beef / 57 mg  1 small side salad (1 cup raw spinach leaves,  cup cucumber,  cup yellow bell pepper) with 1 tsp olive oil and 1 tsp red wine vinegar / 25 mg Snack / Sodium (mg)  1 container low-fat vanilla yogurt / 107 mg  3 graham cracker squares / 127 mg Nutrient Analysis  Calories: 2033  Protein: 77 g  Carbohydrate: 282 g  Fat: 72 g  Sodium: 1971 mg Document Released: 03/16/2005 Document Revised: 06/08/2011 Document Reviewed: 06/17/2009 Exit[employer] Patient Information 2014 [place].

## 2013-02-15 ENCOUNTER — Telehealth: Payer: Self-pay | Admitting: Cardiovascular Disease

## 2013-02-15 NOTE — Telephone Encounter (Signed)
Increase amlodipine to 10 mg daily. thx

## 2013-02-15 NOTE — Telephone Encounter (Signed)
Follow Up  Pt calling to give her latest Bp reading/// requests a call back to discuss  10/27 - 159/92 11/1   - 158/84 11/7   - 142/82 11/10 - 142/90 11/13 - 152/88 11/19 - 140/88

## 2013-02-15 NOTE — Telephone Encounter (Signed)
Per 01/20/13 office visit: ASSESSMENT AND PLAN:  1. Malignant hypertension. The patient is currently managed with amlodipine, losartan, and hydrochlorothiazide. Office blood pressure readings are elevated. I repeated her blood pressure and got 150/100. Home readings are lower by her report although she checks it infrequently. I asked her check her blood pressure at least twice per week and calls back in 2 weeks with readings. Would consider increasing amlodipine from 5-10 mg. However, the patient has had some problems with leg swelling and I'm concerned this might exacerbate that. Will see her back in one year for followup. No medication changes were made today. We will wait to hear from her in 2 weeks. Low sodium diet was reviewed.  I will forward this message to Dr Burt Knack to review the pt's BP readings and make further recommendations.

## 2013-02-15 NOTE — Telephone Encounter (Signed)
I spoke with the pt and made her aware of Dr Antionette Char recommendation.  The pt will take Amlodipine 5 mg two tablets by mouth daily and continue to monitor her BP for 2 more weeks.  The pt will call the office with her readings.  If BP is improved then she would like a 90 day supply of Amlodipine 10 mg sent to the pharmacy at that time.

## 2013-04-28 ENCOUNTER — Other Ambulatory Visit: Payer: Self-pay | Admitting: Family Medicine

## 2013-04-28 MED ORDER — AMLODIPINE BESYLATE 10 MG PO TABS: 10.0000 mg | ORAL_TABLET | Freq: Every day | ORAL | Status: DC

## 2013-05-15 ENCOUNTER — Other Ambulatory Visit: Payer: Self-pay | Admitting: Family Medicine

## 2013-05-15 DIAGNOSIS — E039 Hypothyroidism, unspecified: Secondary | ICD-10-CM

## 2013-05-15 DIAGNOSIS — I10 Essential (primary) hypertension: Secondary | ICD-10-CM

## 2013-05-18 ENCOUNTER — Other Ambulatory Visit: Payer: Medicare Other

## 2013-05-19 ENCOUNTER — Other Ambulatory Visit (INDEPENDENT_AMBULATORY_CARE_PROVIDER_SITE_OTHER): Payer: Medicare Other

## 2013-05-19 DIAGNOSIS — E039 Hypothyroidism, unspecified: Secondary | ICD-10-CM

## 2013-05-19 DIAGNOSIS — E785 Hyperlipidemia, unspecified: Secondary | ICD-10-CM

## 2013-05-19 DIAGNOSIS — I10 Essential (primary) hypertension: Secondary | ICD-10-CM

## 2013-05-19 LAB — COMPREHENSIVE METABOLIC PANEL
ALT: 18 U/L (ref 0–35)
AST: 21 U/L (ref 0–37)
Albumin: 3.8 g/dL (ref 3.5–5.2)
Alkaline Phosphatase: 49 U/L (ref 39–117)
BUN: 21 mg/dL (ref 6–23)
CO2: 30 mEq/L (ref 19–32)
Calcium: 9.6 mg/dL (ref 8.4–10.5)
Chloride: 108 mEq/L (ref 96–112)
Creatinine, Ser: 1.3 mg/dL — ABNORMAL HIGH (ref 0.4–1.2)
GFR: 43.96 mL/min — ABNORMAL LOW (ref >60.00)
Glucose, Bld: 88 mg/dL (ref 70–99)
Potassium: 3.9 mEq/L (ref 3.5–5.1)
Sodium: 143 mEq/L (ref 135–145)
Total Bilirubin: 0.8 mg/dL (ref 0.3–1.2)
Total Protein: 6.7 g/dL (ref 6.0–8.3)

## 2013-05-19 LAB — LIPID PANEL
Cholesterol: 166 mg/dL (ref 0–200)
HDL: 47.8 mg/dL (ref >39.00)
LDL Cholesterol: 91 mg/dL (ref 0–99)
Total CHOL/HDL Ratio: 3
Triglycerides: 137 mg/dL (ref 0.0–149.0)
VLDL: 27.4 mg/dL (ref 0.0–40.0)

## 2013-05-19 LAB — TSH: TSH: 2.5 u[IU]/mL (ref 0.35–5.50)

## 2013-05-25 ENCOUNTER — Encounter: Payer: Medicare Other | Admitting: Family Medicine

## 2013-05-30 ENCOUNTER — Ambulatory Visit (INDEPENDENT_AMBULATORY_CARE_PROVIDER_SITE_OTHER): Payer: Medicare Other | Admitting: Family Medicine

## 2013-05-30 ENCOUNTER — Encounter: Payer: Self-pay | Admitting: Family Medicine

## 2013-05-30 VITALS — BP 128/88 | HR 88 | Temp 98.1°F | Ht 66.75 in | Wt 213.2 lb

## 2013-05-30 DIAGNOSIS — E039 Hypothyroidism, unspecified: Secondary | ICD-10-CM

## 2013-05-30 DIAGNOSIS — E785 Hyperlipidemia, unspecified: Secondary | ICD-10-CM

## 2013-05-30 DIAGNOSIS — I10 Essential (primary) hypertension: Secondary | ICD-10-CM

## 2013-05-30 DIAGNOSIS — Z0000 Encounter for general adult medical examination without abnormal findings: Secondary | ICD-10-CM

## 2013-05-30 MED ORDER — LOSARTAN POTASSIUM-HCTZ 100-25 MG PO TABS: 1.0000 | ORAL_TABLET | Freq: Every day | ORAL | Status: DC

## 2013-05-30 MED ORDER — AMLODIPINE BESYLATE 10 MG PO TABS: 10.0000 mg | ORAL_TABLET | Freq: Every day | ORAL | Status: DC

## 2013-05-30 MED ORDER — FLUTICASONE PROPIONATE 50 MCG/ACT NA SUSP: 2.0000 | Freq: Every day | NASAL | Status: DC | PRN

## 2013-05-30 MED ORDER — LEVOTHYROXINE SODIUM 50 MCG PO TABS: 50.0000 ug | ORAL_TABLET | Freq: Every day | ORAL | Status: DC

## 2013-05-30 MED ORDER — ATORVASTATIN CALCIUM 20 MG PO TABS: 10.0000 mg | ORAL_TABLET | Freq: Every day | ORAL | Status: DC

## 2013-05-30 NOTE — Patient Instructions (Signed)
Take care.  Don't change your meds.  Glad to see you.   Recheck in 1 year.

## 2013-05-30 NOTE — Progress Notes (Signed)
Pre visit review using our clinic review tool, if applicable. No additional management support is needed unless otherwise documented below in the visit note.  I have personally reviewed the Medicare Annual Wellness questionnaire and have noted 1. The patient's medical and social history 2. Their use of alcohol, tobacco or illicit drugs 3. Their current medications and supplements 4. The patient's functional ability including ADL's, fall risks, home safety risks and hearing or visual             impairment. 5. Diet and physical activities 6. Evidence for depression or mood disorders  The patients weight, height, BMI have been recorded in the chart and visual acuity is per eye clinic.  I have made referrals, counseling and provided education to the patient based review of the above and I have provided the pt with a written personalized care plan for preventive services.  See scanned forms. Routine anticipatory guidance given to patient. See health maintenance.  Flu 2014 Shingles 2008  PNA 2001  Tetanus 2014 at ER.   Colonoscopy 2011  Breast cancer screening up to date.  Advance directive d/w pt. Husband would be designated first, then granddaughter Carver Fila if he were unavailable.  Cognitive function addressed- see scanned forms- and if abnormal then additional documentation follows.  DXA 2013  Hypertension:  Using medication without problems or lightheadedness: yes Chest pain with exertion:no Edema: some, less than prev yeShort of breath:no  Elevated Cholesterol: Using medications without problems:yes Muscle aches: yes, at current dose Diet compliance:encouraged.  Exercise:yes  Hypothyroid.  No neck mass, no trouble swallowing.  No ADE on med.  Compliant.    PMH and SH reviewed  Meds, vitals, and allergies reviewed.   ROS: See HPI.  Otherwise negative.    GEN: nad, alert and oriented HEENT: mucous membranes moist NECK: supple w/o LA, no TMG CV: rrr. PULM: ctab, no  inc wob ABD: soft, +bs EXT: no edema SKIN: no acute rash

## 2013-05-31 ENCOUNTER — Telehealth: Payer: Self-pay | Admitting: Family Medicine

## 2013-05-31 NOTE — Telephone Encounter (Signed)
Relevant patient education mailed to patient.  

## 2013-05-31 NOTE — Assessment & Plan Note (Signed)
Controlled, continue current meds.  Labs d/w pt along with diet and exercise.

## 2013-05-31 NOTE — Assessment & Plan Note (Signed)
Controlled, continue current meds. Labs d/w pt.  

## 2013-05-31 NOTE — Assessment & Plan Note (Signed)
See scanned forms. Routine anticipatory guidance given to patient. See health maintenance.  Flu 2014 Shingles 2008  PNA 2001  Tetanus 2014 at ER.   Colonoscopy 2011  Breast cancer screening up to date.  Advance directive d/w pt. Husband would be designated first, then granddaughter Carver Fila if he were unavailable.  Cognitive function addressed- see scanned forms- and if abnormal then additional documentation follows.  DXA 2013

## 2013-06-12 ENCOUNTER — Telehealth: Payer: Self-pay | Admitting: Cardiovascular Disease

## 2013-06-12 ENCOUNTER — Telehealth: Payer: Self-pay | Admitting: Nurse Practitioner

## 2013-06-12 NOTE — Telephone Encounter (Signed)
Left message on patient's home voice mail regarding god BP control and no changes to medical therapy per Dr. Burt Knack.  I advised patient to call office with questions or concerns.

## 2013-06-12 NOTE — Telephone Encounter (Signed)
Patient brought in BP readings per Dr. Burt Knack request.  Pressures were measured late December through 06/08/13 and range from lowest systolic of 845 to highest systolic of 364 and lowest diastolic 62 to highest diastolic reading of 88. Most recent BPs are 128/88, 124/74, 115/69, 121/71, 111/74, 120/68, and 126/77.  There were no pulse readings given.  I am forwarding these to Dr. Burt Knack for his review.

## 2013-06-12 NOTE — Telephone Encounter (Signed)
Walk In pt Form " BP Readings" Dropped Off gave to Kelby Aline

## 2013-06-12 NOTE — Telephone Encounter (Signed)
Sounds like good BP control. Wouldn't make any changes. thx

## 2013-06-16 ENCOUNTER — Other Ambulatory Visit: Payer: Self-pay | Admitting: *Deleted

## 2013-07-14 ENCOUNTER — Other Ambulatory Visit: Payer: Self-pay | Admitting: *Deleted

## 2013-10-05 ENCOUNTER — Other Ambulatory Visit: Payer: Self-pay

## 2013-10-05 DIAGNOSIS — I10 Essential (primary) hypertension: Secondary | ICD-10-CM

## 2013-10-05 MED ORDER — LOSARTAN POTASSIUM-HCTZ 100-25 MG PO TABS: 1.0000 | ORAL_TABLET | Freq: Every day | ORAL | Status: DC

## 2013-12-11 ENCOUNTER — Other Ambulatory Visit: Payer: Self-pay

## 2013-12-11 DIAGNOSIS — Z1231 Encounter for screening mammogram for malignant neoplasm of breast: Secondary | ICD-10-CM

## 2014-01-05 ENCOUNTER — Ambulatory Visit
Admission: RE | Admit: 2014-01-05 | Discharge: 2014-01-05 | Disposition: A | Discharge status: Home / Self Care | Payer: Medicare Other | Source: Ambulatory Visit

## 2014-01-05 DIAGNOSIS — Z1231 Encounter for screening mammogram for malignant neoplasm of breast: Secondary | ICD-10-CM

## 2014-01-09 ENCOUNTER — Encounter: Payer: Self-pay | Admitting: *Deleted

## 2014-01-12 ENCOUNTER — Other Ambulatory Visit: Payer: Self-pay | Admitting: Physician Assistant

## 2014-01-22 ENCOUNTER — Encounter: Payer: Self-pay | Admitting: Cardiovascular Disease

## 2014-01-22 ENCOUNTER — Ambulatory Visit (INDEPENDENT_AMBULATORY_CARE_PROVIDER_SITE_OTHER): Payer: Medicare Other | Admitting: Cardiovascular Disease

## 2014-01-22 VITALS — BP 138/82 | HR 57 | Ht 65.75 in | Wt 216.1 lb

## 2014-01-22 DIAGNOSIS — I10 Essential (primary) hypertension: Secondary | ICD-10-CM

## 2014-01-22 DIAGNOSIS — E785 Hyperlipidemia, unspecified: Secondary | ICD-10-CM

## 2014-01-22 NOTE — Progress Notes (Signed)
Background: The patient is followed for hypertension and chest pain. Last stress Myoview in 2014 showed normal LV function and no ischemia.  HPI:  80 year old woman presenting for follow-up evaluation. She was last seen 1 year ago. At the time of her visit last year, the patient's blood pressure was not well controlled. Amlodipine was increased. Blood pressure has been doing much better. She denies symptoms of chest pain, chest pressure, shortness of breath, orthopnea, PND, or heart palpitations. Leg swelling is unchanged. She's been through a lot of stress in the last 6-8 months because of her husband's poor health.  Studies:  Myoview Scan 06/07/2012:  Impression  Exercise Capacity: Lexiscan with low level exercise.  BP Response: Normal blood pressure response.  Clinical Symptoms: shortness of breath  ECG Impression: No significant ST segment change suggestive of ischemia.  Comparison with Prior Nuclear Study: No previous nuclear study performed  Overall Impression: Normal stress nuclear scan. There is mild decreased activity at the apex most consistent with apical thinning. There is interference from nuclear activity from structures below the diaphragm. This does not affect the ability to read the study.  LV Ejection Fraction: 55%. LV Wall Motion: NL LV Function; NL Wall Motion   Lipids 05/19/2013: Lipid Panel     Component Value Date/Time   CHOL 166 05/19/2013 1115   TRIG 137.0 05/19/2013 1115   HDL 47.80 05/19/2013 1115   CHOLHDL 3 05/19/2013 1115   VLDL 27.4 05/19/2013 1115   LDLCALC 91 05/19/2013 1115   LDLDIRECT 165.5 07/06/2011 0938      Outpatient Encounter Prescriptions as of 01/22/2014  Medication Sig  . amLODipine (NORVASC) 10 MG tablet Take 1 tablet (10 mg total) by mouth daily.  Marland Kitchen aspirin EC 81 MG tablet Take 1 tablet (81 mg total) by mouth daily.  Marland Kitchen atorvastatin (LIPITOR) 20 MG tablet Take 0.5 tablets (10 mg total) by mouth daily.  . Calcium 600-200 MG-UNIT per tablet  Take 1 tablet by mouth as needed.   Marland Kitchen co-enzyme Q-10 30 MG capsule Take 90 mg by mouth daily.   Marland Kitchen docusate sodium (COLACE) 100 MG capsule Take 100 mg by mouth daily.  . fluticasone (FLONASE) 50 MCG/ACT nasal spray Place 2 sprays into both nostrils daily as needed for rhinitis.  Marland Kitchen levothyroxine (SYNTHROID, LEVOTHROID) 50 MCG tablet Take 1 tablet (50 mcg total) by mouth daily.  Marland Kitchen losartan-hydrochlorothiazide (HYZAAR) 100-25 MG per tablet Take 1 tablet by mouth daily.  . Multiple Vitamin (MULTIVITAMIN) tablet Take 1 tablet by mouth daily.    Allergies  Allergen Reactions  . Celebrex [Celecoxib] Swelling    And rash  . Simvastatin     Myalgias    Past Medical History  Diagnosis Date  . Hypothyroidism 10/29/1995  . Hypertension 10/29/1995  . Hyperlipidemia 01/29/1991  . Melanoma     h/o, local excision. no chemo or rady.     family history includes Cancer in her brother and sister; Heart disease in her mother; Stroke in her mother. There is no history of Colon cancer or Breast cancer.   ROS: Negative except as per HPI  BP 138/82  Pulse 57  Ht 5' 5.75" (1.67 m)  Wt 216 lb 1.9 oz (98.031 kg)  BMI 35.15 kg/m2  PHYSICAL EXAM: Pt is alert and oriented, pleasant elderly woman in NAD HEENT: normal Neck: JVP - normal, carotids 2+= without bruits Lungs: CTA bilaterally CV: RRR without murmur or gallop Abd: soft, NT, Positive BS, no hepatomegaly Ext: Trace pretibial edema bilaterally,  distal pulses intact and equal Skin: warm/dry no rash  EKG:  Sinus bradycardia 57 beats per, left anterior fascicular block, age indeterminate septal infarct.  ASSESSMENT AND PLAN: 1. Essential hypertension. Blood pressure is well controlled on her current medical program. Will continue the same regimen without changes.  2. Hyperlipidemia. Lipids reviewed as above. Patient is followed by Dr. Damita Dunnings. She is treated with atorvastatin.  For follow-up I will see her back in one year.  Sherren Mocha,  MD 01/22/2014 10:25 AM

## 2014-01-22 NOTE — Patient Instructions (Signed)
Your physician wants you to follow-up in: 1 YEAR with Dr Cooper.  You will receive a reminder letter in the mail two months in advance. If you don't receive a letter, please call our office to schedule the follow-up appointment.  Your physician recommends that you continue on your current medications as directed. Please refer to the Current Medication list given to you today.  

## 2014-01-25 ENCOUNTER — Ambulatory Visit: Payer: Medicare Other | Admitting: Cardiovascular Disease

## 2014-03-30 HISTORY — PX: DG BARIUM ENEMA (ARMC HX): HXRAD1447

## 2014-06-03 ENCOUNTER — Other Ambulatory Visit: Payer: Self-pay | Admitting: Family Medicine

## 2014-06-03 DIAGNOSIS — E039 Hypothyroidism, unspecified: Secondary | ICD-10-CM

## 2014-06-03 DIAGNOSIS — I10 Essential (primary) hypertension: Secondary | ICD-10-CM

## 2014-06-04 ENCOUNTER — Other Ambulatory Visit (INDEPENDENT_AMBULATORY_CARE_PROVIDER_SITE_OTHER): Payer: Medicare Other

## 2014-06-04 ENCOUNTER — Other Ambulatory Visit: Payer: Self-pay | Admitting: *Deleted

## 2014-06-04 DIAGNOSIS — I10 Essential (primary) hypertension: Secondary | ICD-10-CM

## 2014-06-04 LAB — LIPID PANEL
Cholesterol: 247 mg/dL — ABNORMAL HIGH (ref 0–200)
HDL: 42.5 mg/dL (ref >39.00)
LDL Cholesterol: 166 mg/dL — ABNORMAL HIGH (ref 0–99)
NonHDL: 204.5
Total CHOL/HDL Ratio: 6
Triglycerides: 191 mg/dL — ABNORMAL HIGH (ref 0.0–149.0)
VLDL: 38.2 mg/dL (ref 0.0–40.0)

## 2014-06-04 LAB — COMPREHENSIVE METABOLIC PANEL
ALT: 20 U/L (ref 0–35)
AST: 28 U/L (ref 0–37)
Albumin: 4.1 g/dL (ref 3.5–5.2)
Alkaline Phosphatase: 54 U/L (ref 39–117)
BUN: 19 mg/dL (ref 6–23)
CO2: 32 mEq/L (ref 19–32)
Calcium: 10.1 mg/dL (ref 8.4–10.5)
Chloride: 106 mEq/L (ref 96–112)
Creatinine, Ser: 1.33 mg/dL — ABNORMAL HIGH (ref 0.40–1.20)
GFR: 40.82 mL/min — ABNORMAL LOW (ref >60.00)
Glucose, Bld: 95 mg/dL (ref 70–99)
Potassium: 4.1 mEq/L (ref 3.5–5.1)
Sodium: 142 mEq/L (ref 135–145)
Total Bilirubin: 0.6 mg/dL (ref 0.2–1.2)
Total Protein: 6.9 g/dL (ref 6.0–8.3)

## 2014-06-04 LAB — TSH: TSH: 4.1 u[IU]/mL (ref 0.35–4.50)

## 2014-06-04 MED ORDER — LEVOTHYROXINE SODIUM 50 MCG PO TABS: 50.0000 ug | ORAL_TABLET | Freq: Every day | ORAL | Status: DC

## 2014-06-07 ENCOUNTER — Ambulatory Visit (INDEPENDENT_AMBULATORY_CARE_PROVIDER_SITE_OTHER): Payer: Medicare Other | Admitting: Family Medicine

## 2014-06-07 ENCOUNTER — Encounter: Payer: Self-pay | Admitting: Family Medicine

## 2014-06-07 VITALS — BP 120/70 | HR 63 | Temp 98.0°F | Ht 65.0 in | Wt 213.0 lb

## 2014-06-07 DIAGNOSIS — E785 Hyperlipidemia, unspecified: Secondary | ICD-10-CM

## 2014-06-07 DIAGNOSIS — I10 Essential (primary) hypertension: Secondary | ICD-10-CM

## 2014-06-07 DIAGNOSIS — Z7189 Other specified counseling: Secondary | ICD-10-CM

## 2014-06-07 DIAGNOSIS — Z0000 Encounter for general adult medical examination without abnormal findings: Secondary | ICD-10-CM | POA: Diagnosis not present

## 2014-06-07 DIAGNOSIS — E039 Hypothyroidism, unspecified: Secondary | ICD-10-CM

## 2014-06-07 DIAGNOSIS — Z23 Encounter for immunization: Secondary | ICD-10-CM

## 2014-06-07 MED ORDER — LEVOTHYROXINE SODIUM 50 MCG PO TABS: 50.0000 ug | ORAL_TABLET | Freq: Every day | ORAL | Status: DC

## 2014-06-07 MED ORDER — AMLODIPINE BESYLATE 10 MG PO TABS: 10.0000 mg | ORAL_TABLET | Freq: Every day | ORAL | Status: DC

## 2014-06-07 MED ORDER — LOSARTAN POTASSIUM-HCTZ 100-25 MG PO TABS: 1.0000 | ORAL_TABLET | Freq: Every day | ORAL | Status: DC

## 2014-06-07 NOTE — Patient Instructions (Signed)
Take care.  Glad to see you.  I would stay off the cholesterol medicine for now.  Recheck in 1 year, labs ahead of time.

## 2014-06-07 NOTE — Progress Notes (Signed)
Pre visit review using our clinic review tool, if applicable. No additional management support is needed unless otherwise documented below in the visit note.  I have personally reviewed the Medicare Annual Wellness questionnaire and have noted 1. The patient's medical and social history 2. Their use of alcohol, tobacco or illicit drugs 3. Their current medications and supplements 4. The patient's functional ability including ADL's, fall risks, home safety risks and hearing or visual             impairment. 5. Diet and physical activities 6. Evidence for depression or mood disorders  The patients weight, height, BMI have been recorded in the chart and visual acuity is per eye clinic.  I have made referrals, counseling and provided education to the patient based review of the above and I have provided the pt with a written personalized care plan for preventive services.  Provider list updated- see scanned forms.  Routine anticipatory guidance given to patient.  See health maintenance.  Flu due fall 2016 Shingles 2008 PNA 2001 Tetanus 2014 Colonoscopy 2011 Breast cancer screening 2015 DXA done 2013, can consider in 2017 Advance directive- husband then granddaughter [name] designated if patient were incapacitated.   Cognitive function addressed- see scanned forms- and if abnormal then additional documentation follows.   Hypertension:    Using medication without problems or lightheadedness: yes Chest pain with exertion:no Edema: occ BLE edema Short of breath:no Compliant with meds  Elevated Cholesterol: Using medications without problems:no- had aches on statin, improved off med. Muscle aches: prev yes, see above Diet compliance:yes Exercise:yes  Hypothyroidism.  TSH wnl.  No lump, mass in neck.  No dysphagia.  Compliant with meds.    PMH and SH reviewed  Meds, vitals, and allergies reviewed.   ROS: See HPI.  Otherwise negative.    GEN: nad, alert and oriented HEENT: mucous  membranes moist NECK: supple w/o LA, no TMG CV: rrr. PULM: ctab, no inc wob ABD: soft, +bs EXT: no edema SKIN: no acute rash

## 2014-06-08 DIAGNOSIS — Z7189 Other specified counseling: Secondary | ICD-10-CM | POA: Insufficient documentation

## 2014-06-08 NOTE — Assessment & Plan Note (Signed)
Flu due fall 2016 Shingles 2008 PNA 2001 Tetanus 2014 Colonoscopy 2011 Breast cancer screening 2015 DXA done 2013, can consider in 2017 Advance directive- husband then granddaughter [name] designated if patient were incapacitated.   Cognitive function addressed- see scanned forms- and if abnormal then additional documentation follows.

## 2014-06-08 NOTE — Assessment & Plan Note (Signed)
Controlled, continue as is.  No change in meds.  Labs d/w pt.

## 2014-06-08 NOTE — Assessment & Plan Note (Signed)
Likely statin intolerant, would continue off med, but continue diet and exercise.  Labs d/w pt.  She agrees.  Risk of statin use likely outweighs benefit at this point.

## 2014-09-27 ENCOUNTER — Other Ambulatory Visit: Payer: Self-pay | Admitting: Family Medicine

## 2014-10-24 ENCOUNTER — Encounter: Payer: Self-pay | Admitting: Internal Medicine

## 2014-11-21 ENCOUNTER — Encounter: Payer: Self-pay | Admitting: Internal Medicine

## 2014-12-05 ENCOUNTER — Other Ambulatory Visit: Payer: Self-pay

## 2014-12-05 DIAGNOSIS — Z1231 Encounter for screening mammogram for malignant neoplasm of breast: Secondary | ICD-10-CM

## 2014-12-20 ENCOUNTER — Encounter: Payer: Self-pay | Admitting: *Deleted

## 2015-01-16 ENCOUNTER — Ambulatory Visit
Admission: RE | Admit: 2015-01-16 | Discharge: 2015-01-16 | Disposition: A | Discharge status: Home / Self Care | Payer: Medicare Other | Source: Ambulatory Visit

## 2015-01-16 ENCOUNTER — Encounter: Payer: Self-pay | Admitting: Internal Medicine

## 2015-01-16 ENCOUNTER — Ambulatory Visit (INDEPENDENT_AMBULATORY_CARE_PROVIDER_SITE_OTHER): Payer: Medicare Other | Admitting: Internal Medicine

## 2015-01-16 VITALS — BP 132/84 | HR 76 | Ht 65.75 in | Wt 215.1 lb

## 2015-01-16 DIAGNOSIS — K5900 Constipation, unspecified: Secondary | ICD-10-CM

## 2015-01-16 DIAGNOSIS — Z1211 Encounter for screening for malignant neoplasm of colon: Secondary | ICD-10-CM

## 2015-01-16 DIAGNOSIS — K649 Unspecified hemorrhoids: Secondary | ICD-10-CM | POA: Diagnosis not present

## 2015-01-16 DIAGNOSIS — Z86010 Personal history of colonic polyps: Secondary | ICD-10-CM | POA: Diagnosis not present

## 2015-01-16 DIAGNOSIS — Z1231 Encounter for screening mammogram for malignant neoplasm of breast: Secondary | ICD-10-CM

## 2015-01-16 NOTE — Progress Notes (Signed)
Subjective:    Patient ID: Lisa [name], female    DOB: 12-14-1932, 81 y.o.   MRN: [id number]  HPI Lisa [name] is an 81 year old female with past medical history of adenomatous colon polyps, hemorrhoids, diverticulosis, hypertension, hyperlipidemia and hypothyroidism who seen in follow-up to discuss surveillance colonoscopy. She is here alone today. She was previously seen by Dr. Sharlett Iles and had a surveillance colonoscopy which was performed on 12/09/2009. This revealed moderate diverticulosis in the left colon. There were several polyps removed from the cecum. These were found to be tubular adenomas. High-fiber diet was recommended and follow-up colonoscopy recommended in 5 years.  She reports today that she is doing well though she has had more issues with constipation over the years. She's been using intermittent senna which works well for her. She uses laxative about once per week. At times she reports hard stool being difficult to pass and she has been told by her primary doctor that this is secondary to pelvic floor relaxation. She does have occasional bright red blood when passing hard stool which she attributed to hemorrhoids. She denies pain. She reports good appetite. Denies dysphagia, odynophagia or heartburn. No early satiety. No weight loss. No abdominal pain. No melena.  Review of Systems As per history of present illness, otherwise negative  Current Medications, Allergies, Past Medical History, Past Surgical History, Family History and Social History were reviewed in [employer] record.     Objective:   Physical Exam BP 132/84 mmHg  Pulse 76  Ht 5' 5.75" (1.67 m)  Wt 215 lb 2 oz (97.58 kg)  BMI 34.99 kg/m2 Constitutional: Well-developed and well-nourished. No distress. HEENT: Normocephalic and atraumatic.  Conjunctivae are normal.  No scleral icterus. Neck: Neck supple. Trachea midline. Cardiovascular: Normal rate, regular rhythm and intact distal  pulses.  Pulmonary/chest: Effort normal and breath sounds normal. No wheezing, rales or rhonchi. Abdominal: Soft, nontender, nondistended. Bowel sounds active throughout. There are no masses palpable.  Extremities: no clubbing, cyanosis, or edema Neurological: Alert and oriented to person place and time. Psychiatric: Normal mood and affect. Behavior is normal.     Assessment & Plan:   81 year old female with past medical history of adenomatous colon polyps, hemorrhoids, diverticulosis, hypertension, hyperlipidemia and hypothyroidism who seen in follow-up to discuss surveillance colonoscopy.  1. History of adenomatous colon polyp/colorectal cancer surveillance --  we had a thorough discussion today regarding colorectal cancer screening and her age. She does have a history of adenomatous polyps. Current guidelines suggest discontinuation of colorectal cancer screening at age 49 though this decision can be individualized. Overall her health is well now without significant morbidity. We discussed repeat colonoscopy, discontinuation screening and barium enema or CT colonoscopy. I do not think Medicare would cover CT colonoscopy. She is a bit concerned about the risk of colonoscopy compared to potential benefit which I think is very reasonable. She would like more time to think about this decision but is leaning towards barium enema. She understands that barium enema revealed polypoid lesion she would need colonoscopy thereafter. She will call me back with her decision  2. Constipation -- mild and intermittent. Responding to senna. She can continue senna on an as-needed basis  3. Hemorrhoidal bleeding -- intermittent. Improving constipation should improve hemorrhoidal bleeding. We discussed hemorrhoidal banding should bleeding become more frequent or become more bothersome.  She will return as needed and we will wait to hear from her regarding her decision as discussed in #1 25 minutes spent with her  today

## 2015-01-16 NOTE — Patient Instructions (Signed)
Please think about whether you would like to have colonoscopy, barium enema or nothing at all. Call us back at [phone] to let us know your decision.

## 2015-01-21 ENCOUNTER — Other Ambulatory Visit: Payer: Self-pay

## 2015-01-21 ENCOUNTER — Telehealth: Payer: Self-pay | Admitting: Internal Medicine

## 2015-01-21 DIAGNOSIS — Z1211 Encounter for screening for malignant neoplasm of colon: Secondary | ICD-10-CM

## 2015-01-21 NOTE — Telephone Encounter (Signed)
Pt scheduled for barium enema at [facility] radiology 01/28/15'@10'$ :30am. Pt to arrive there at 10:15am. Pt to be NPO after midnight. Prep instructions mailed to pt.

## 2015-01-28 ENCOUNTER — Ambulatory Visit (HOSPITAL_COMMUNITY)
Admission: RE | Admit: 2015-01-28 | Discharge: 2015-01-28 | Disposition: A | Discharge status: Home / Self Care | Payer: Medicare Other | Source: Ambulatory Visit | Attending: Internal Medicine | Admitting: Internal Medicine

## 2015-01-28 DIAGNOSIS — K5730 Diverticulosis of large intestine without perforation or abscess without bleeding: Secondary | ICD-10-CM | POA: Insufficient documentation

## 2015-01-28 DIAGNOSIS — Z1211 Encounter for screening for malignant neoplasm of colon: Secondary | ICD-10-CM | POA: Diagnosis present

## 2015-01-28 DIAGNOSIS — Z86010 Personal history of colonic polyps: Secondary | ICD-10-CM | POA: Insufficient documentation

## 2015-02-11 ENCOUNTER — Ambulatory Visit (INDEPENDENT_AMBULATORY_CARE_PROVIDER_SITE_OTHER): Payer: Medicare Other

## 2015-02-11 DIAGNOSIS — Z23 Encounter for immunization: Secondary | ICD-10-CM

## 2015-02-27 ENCOUNTER — Encounter: Payer: Self-pay | Admitting: Family Medicine

## 2015-02-27 ENCOUNTER — Ambulatory Visit (INDEPENDENT_AMBULATORY_CARE_PROVIDER_SITE_OTHER): Payer: Medicare Other | Admitting: Family Medicine

## 2015-02-27 VITALS — BP 118/76 | HR 83 | Temp 98.5°F | Ht 65.75 in | Wt 215.2 lb

## 2015-02-27 DIAGNOSIS — R300 Dysuria: Secondary | ICD-10-CM | POA: Diagnosis not present

## 2015-02-27 DIAGNOSIS — N390 Urinary tract infection, site not specified: Secondary | ICD-10-CM | POA: Insufficient documentation

## 2015-02-27 DIAGNOSIS — N3001 Acute cystitis with hematuria: Secondary | ICD-10-CM

## 2015-02-27 LAB — POCT URINALYSIS DIPSTICK
Bilirubin, UA: NEGATIVE
Glucose, UA: NEGATIVE
Ketones, UA: NEGATIVE
Nitrite, UA: POSITIVE
Protein, UA: >=300
Spec Grav, UA: 1.02
Urobilinogen, UA: 0.2
pH, UA: 6.5

## 2015-02-27 MED ORDER — CEPHALEXIN 500 MG PO CAPS: 500.0000 mg | ORAL_CAPSULE | Freq: Two times a day (BID) | ORAL | Status: DC

## 2015-02-27 NOTE — Progress Notes (Signed)
Pre visit review using our clinic review tool, if applicable. No additional management support is needed unless otherwise documented below in the visit note. 

## 2015-02-27 NOTE — Patient Instructions (Signed)
Nice to meet you. You have a urinary tract infection. We will treat this with Keflex. We will send it for culture and microscopy. If you develop fever, abdominal pain, chills, blood in your urine, or any new or change in symptoms please seek medical attention.  Urinary Tract Infection Urinary tract infections (UTIs) can develop anywhere along your urinary tract. Your urinary tract is your body's drainage system for removing wastes and extra water. Your urinary tract includes two kidneys, two ureters, a bladder, and a urethra. Your kidneys are a pair of bean-shaped organs. Each kidney is about the size of your fist. They are located below your ribs, one on each side of your spine. CAUSES Infections are caused by microbes, which are microscopic organisms, including fungi, viruses, and bacteria. These organisms are so small that they can only be seen through a microscope. Bacteria are the microbes that most commonly cause UTIs. SYMPTOMS  Symptoms of UTIs may vary by age and gender of the patient and by the location of the infection. Symptoms in young women typically include a frequent and intense urge to urinate and a painful, burning feeling in the bladder or urethra during urination. Older women and men are more likely to be tired, shaky, and weak and have muscle aches and abdominal pain. A fever may mean the infection is in your kidneys. Other symptoms of a kidney infection include pain in your back or sides below the ribs, nausea, and vomiting. DIAGNOSIS To diagnose a UTI, your caregiver will ask you about your symptoms. Your caregiver will also ask you to provide a urine sample. The urine sample will be tested for bacteria and white blood cells. White blood cells are made by your body to help fight infection. TREATMENT  Typically, UTIs can be treated with medication. Because most UTIs are caused by a bacterial infection, they usually can be treated with the use of antibiotics. The choice of antibiotic  and length of treatment depend on your symptoms and the type of bacteria causing your infection. HOME CARE INSTRUCTIONS  If you were prescribed antibiotics, take them exactly as your caregiver instructs you. Finish the medication even if you feel better after you have only taken some of the medication.  Drink enough water and fluids to keep your urine clear or pale yellow.  Avoid caffeine, tea, and carbonated beverages. They tend to irritate your bladder.  Empty your bladder often. Avoid holding urine for long periods of time.  Empty your bladder before and after sexual intercourse.  After a bowel movement, women should cleanse from front to back. Use each tissue only once. SEEK MEDICAL CARE IF:   You have back pain.  You develop a fever.  Your symptoms do not begin to resolve within 3 days. SEEK IMMEDIATE MEDICAL CARE IF:   You have severe back pain or lower abdominal pain.  You develop chills.  You have nausea or vomiting.  You have continued burning or discomfort with urination. MAKE SURE YOU:   Understand these instructions.  Will watch your condition.  Will get help right away if you are not doing well or get worse.   This information is not intended to replace advice given to you by your health care provider. Make sure you discuss any questions you have with your health care provider.   Document Released: 12/24/2004 Document Revised: 12/05/2014 Document Reviewed: 04/24/2011 Elsevier Interactive Patient Education [employer].

## 2015-02-27 NOTE — Progress Notes (Signed)
Patient ID: Lisa [name], female   DOB: 12-25-1932, 81 y.o.   MRN: [id number]  Tommi Rumps, MD Phone: [phone]  Lisa [name] is a 81 y.o. female who presents today for same-day visit.  Patient notes several days of dysuria, urgency, and frequency with urination. She does note some of the urgency and frequency has improved. The dysuria still persists. She denies abdominal pain and fevers. She does note she was told by her husband's GI physician that she felt warm. She does note some overall body aches. She has had mild rhinorrhea, though no congestion, cough, ear fullness, shortness of breath, or other symptoms. She feels well at this time.  PMH: Former smoker.   ROS see history of present illness  Objective  Physical Exam Filed Vitals:   02/27/15 1509  BP: 118/76  Pulse: 83  Temp: 98.5 F (36.9 C)    Physical Exam  Constitutional: She is well-developed, well-nourished, and in no distress.  HENT:  Head: Normocephalic and atraumatic.  Right Ear: External ear normal.  Left Ear: External ear normal.  Mouth/Throat: Oropharynx is clear and moist. No oropharyngeal exudate.  Normal TMs bilaterally  Eyes: Conjunctivae are normal. Pupils are equal, round, and reactive to light.  Neck: Neck supple.  Cardiovascular: Normal rate, regular rhythm and normal heart sounds.  Exam reveals no gallop and no friction rub.   No murmur heard. Pulmonary/Chest: Effort normal and breath sounds normal. No respiratory distress. She has no wheezes. She has no rales.  Abdominal: Soft. Bowel sounds are normal. She exhibits no distension. There is no tenderness. There is no rebound and no guarding.  No CVA tenderness  Musculoskeletal:  No midline spine tenderness no midline spine step-off, no muscular back tenderness, no back swelling  Lymphadenopathy:    She has no cervical adenopathy.  Neurological: She is alert.  CN 2-12 intact, 5/5 strength in bilateral biceps, triceps, grip, quads,  hamstrings, plantar and dorsiflexion, sensation to light touch intact in bilateral UE and LE, normal gait, 2+ patellar reflexes  Skin: Skin is warm and dry. She is not diaphoretic.     Assessment/Plan: Please see individual problem list.  UTI (urinary tract infection) UA and symptoms consistent with UTI. Review of systems negative for other causes at this time. Exam is benign today. We will treat her with Keflex twice daily. We'll send urine for culture and micro-. She'll monitor her symptoms. She's given return precautions.    Orders Placed This Encounter  Procedures  . Urine Culture  . Urine Microscopic Only  . POCT Urinalysis Dipstick    Meds ordered this encounter  Medications  . DISCONTD: cephALEXin (KEFLEX) 500 MG capsule    Sig: Take 1 capsule (500 mg total) by mouth 2 (two) times daily.    Dispense:  14 capsule    Refill:  0  . cephALEXin (KEFLEX) 500 MG capsule    Sig: Take 1 capsule (500 mg total) by mouth 2 (two) times daily.    Dispense:  14 capsule    Refill:  0    Tommi Rumps

## 2015-02-27 NOTE — Assessment & Plan Note (Addendum)
UA and symptoms consistent with UTI. Review of systems negative for other causes at this time. Exam is benign today. We will treat her with Keflex twice daily. We'll send urine for culture and micro-. She'll monitor her symptoms. She's given return precautions.

## 2015-02-28 LAB — URINALYSIS, MICROSCOPIC ONLY

## 2015-03-01 LAB — URINE CULTURE: Colony Count: >=100000

## 2015-03-04 ENCOUNTER — Encounter: Payer: Self-pay | Admitting: Cardiovascular Disease

## 2015-03-04 ENCOUNTER — Ambulatory Visit (INDEPENDENT_AMBULATORY_CARE_PROVIDER_SITE_OTHER): Payer: Medicare Other | Admitting: Cardiovascular Disease

## 2015-03-04 VITALS — BP 116/72 | HR 75 | Ht 65.75 in | Wt 209.0 lb

## 2015-03-04 DIAGNOSIS — I10 Essential (primary) hypertension: Secondary | ICD-10-CM | POA: Diagnosis not present

## 2015-03-04 DIAGNOSIS — E785 Hyperlipidemia, unspecified: Secondary | ICD-10-CM

## 2015-03-04 NOTE — Progress Notes (Signed)
Cardiology Office Note Date:  03/04/2015   ID:  Lisa [name], DOB 1932/07/02, MRN [id number]  PCP:  Elsie Stain, MD  Cardiologist:  Sherren Mocha, MD    Chief Complaint  Patient presents with  . Hypertension   History of Present Illness: Lisa [name] is a 81 y.o. female who presents for follow-up of hypertension and chest pain. She was last seen in 2015. Last stress nuclear in 2014 showed normal LV function and no ischemia.   Since her last visit, the patient has no cardiac-related complaints. Today, she denies symptoms of palpitations, chest pain, shortness of breath, orthopnea, PND, lower extremit, dizziness, or syncope. She's had a recent UTI and reports diminished appetite and urinary frequency.   Past Medical History  Diagnosis Date  . Hypothyroidism 10/29/1995  . Hypertension 10/29/1995  . Hyperlipidemia 01/29/1991  . Melanoma ([place])     h/o, local excision. no chemo or rady.   . Tubular adenoma of colon   . Hemorrhoids     internal and external  . Diverticulosis   . Skin cancer (melanoma) [facility])     Past Surgical History  Procedure Laterality Date  . Tonsillectomy  1954  . Hernia repair  04/25/2001  . Septoplasty  2006    Dr. Ernesto Rutherford  . Cataract surgery  2007, 2008    repair bilat  . Knee surgery      meniscus tear per Dr. Ronnie Derby, R knee  . Vaginal hysterectomy    . Skin cancer excision      Current Outpatient Prescriptions  Medication Sig Dispense Refill  . amLODipine (NORVASC) 10 MG tablet Take 1 tablet (10 mg total) by mouth daily. 90 tablet 3  . aspirin EC 81 MG tablet Take 1 tablet (81 mg total) by mouth daily. 1 tablet 0  . cephALEXin (KEFLEX) 500 MG capsule Take 1 capsule (500 mg total) by mouth 2 (two) times daily. 14 capsule 0  . co-enzyme Q-10 30 MG capsule Take 90 mg by mouth daily.     Marland Kitchen docusate sodium (COLACE) 100 MG capsule Take 100 mg by mouth daily.    . fluticasone (FLONASE) 50 MCG/ACT nasal spray USE 2 SPRAYS IN EACH NOSTRIL DAILY AS  NEEDED FOR RHINITIS 48 g 1  . levothyroxine (SYNTHROID, LEVOTHROID) 50 MCG tablet Take 1 tablet (50 mcg total) by mouth daily. 90 tablet 3  . losartan-hydrochlorothiazide (HYZAAR) 100-25 MG per tablet Take 1 tablet by mouth daily. 90 tablet 3  . Multiple Vitamin (MULTIVITAMIN) tablet Take 1 tablet by mouth daily.    Marland Kitchen OVER THE COUNTER MEDICATION      No current facility-administered medications for this visit.    Allergies:   Atorvastatin; Celebrex; and Simvastatin   Social History:  The patient  reports that she has quit smoking. She has never used smokeless tobacco. She reports that she does not drink alcohol or use illicit drugs.   Family History:  The patient's  family history includes Cancer in her brother; Heart disease in her mother; Stomach cancer in her sister; Stroke in her mother. There is no history of Colon cancer or Breast cancer.    ROS:  Please see the history of present illness.  Otherwise, review of systems is positive for easy bruising, fatigue, leg swelling, back pain, muscle pain, diarrhea.  All other systems are reviewed and negative.    PHYSICAL EXAM: VS:  BP 116/72 mmHg  Pulse 75  Ht 5' 5.75" (1.67 m)  Wt 209 lb (94.802 kg)  BMI 33.99 kg/m2 , BMI Body mass index is 33.99 kg/(m^2). GEN: Well nourished, well developed, in no acute distress HEENT: normal Neck: no JVD, no masses. No carotid bruits Cardiac: RRR without murmur or gallop                Respiratory:  clear to auscultation bilaterally, normal work of breathing GI: soft, nontender, nondistended, + BS MS: no deformity or atrophy Ext: trace bilateral pretibial edema, pedal pulses 2+= bilaterally Skin: warm and dry, no rash Neuro:  Strength and sensation are intact Psych: euthymic mood, full affect  EKG:  EKG is ordered today. The ekg ordered today shows NSR, LAFB  Recent Labs: 06/04/2014: ALT 20; BUN 19; Creatinine, Ser 1.33*; Potassium 4.1; Sodium 142; TSH 4.10   Lipid Panel     Component Value  Date/Time   CHOL 247* 06/04/2014 1058   TRIG 191.0* 06/04/2014 1058   HDL 42.50 06/04/2014 1058   CHOLHDL 6 06/04/2014 1058   VLDL 38.2 06/04/2014 1058   LDLCALC 166* 06/04/2014 1058   LDLDIRECT 165.5 07/06/2011 0938      Wt Readings from Last 3 Encounters:  03/04/15 209 lb (94.802 kg)  02/27/15 215 lb 3.2 oz (97.614 kg)  01/16/15 215 lb 2 oz (97.58 kg)     Cardiac Studies Reviewed: Myoview Scan 06/07/2012:  Impression  Exercise Capacity: Lexiscan with low level exercise.  BP Response: Normal blood pressure response.  Clinical Symptoms: shortness of breath  ECG Impression: No significant ST segment change suggestive of ischemia.  Comparison with Prior Nuclear Study: No previous nuclear study performed  Overall Impression: Normal stress nuclear scan. There is mild decreased activity at the apex most consistent with apical thinning. There is interference from nuclear activity from structures below the diaphragm. This does not affect the ability to read the study.  LV Ejection Fraction: 55%. LV Wall Motion: NL LV Function; NL Wall Motion   ASSESSMENT AND PLAN: 1.  Essential hypertension: BP well-controlled on amlodipine, losartan, and HCTZ.  2. Hyperlipidemia: statin-intolerant. Last cholesterol 247 with LDL 166 and HDL 43. Considering advanced age and primary risk reduction, she will continue with lifestyle (non-pharmacologic) measures.  3. Chest pain: no recurrence since initial evaluation and negative stress test.   Current medicines are reviewed with the patient today.  The patient does not have concerns regarding medicines.  Labs/ tests ordered today include:   Orders Placed This Encounter  Procedures  . EKG 12-Lead    Disposition:   FU one year  Signed, Sherren Mocha, MD  03/04/2015 11:27 AM    [facility] [address], [place], [place]  [address] Phone: [phone]; Fax: [phone]

## 2015-03-04 NOTE — Patient Instructions (Signed)

## 2015-03-06 ENCOUNTER — Other Ambulatory Visit: Payer: Self-pay | Admitting: Family Medicine

## 2015-03-19 ENCOUNTER — Encounter: Payer: Self-pay | Admitting: Internal Medicine

## 2015-03-19 ENCOUNTER — Ambulatory Visit (INDEPENDENT_AMBULATORY_CARE_PROVIDER_SITE_OTHER): Payer: Medicare Other | Admitting: Internal Medicine

## 2015-03-19 VITALS — BP 124/80 | HR 64 | Temp 97.4°F | Wt 208.5 lb

## 2015-03-19 DIAGNOSIS — E038 Other specified hypothyroidism: Secondary | ICD-10-CM | POA: Diagnosis not present

## 2015-03-19 DIAGNOSIS — R300 Dysuria: Secondary | ICD-10-CM

## 2015-03-19 DIAGNOSIS — R5383 Other fatigue: Secondary | ICD-10-CM | POA: Diagnosis not present

## 2015-03-19 LAB — CBC
HCT: 38.2 % (ref 36.0–46.0)
Hemoglobin: 12.7 g/dL (ref 12.0–15.0)
MCHC: 33.2 g/dL (ref 30.0–36.0)
MCV: 87.2 fl (ref 78.0–100.0)
Platelets: 192 10*3/uL (ref 150.0–400.0)
RBC: 4.38 Mil/uL (ref 3.87–5.11)
RDW: 14.5 % (ref 11.5–15.5)
WBC: 4.1 10*3/uL (ref 4.0–10.5)

## 2015-03-19 LAB — TSH: TSH: 2.88 u[IU]/mL (ref 0.35–4.50)

## 2015-03-19 LAB — POCT URINALYSIS DIPSTICK
Bilirubin, UA: NEGATIVE
Blood, UA: NEGATIVE
Glucose, UA: NEGATIVE
Ketones, UA: NEGATIVE
Nitrite, UA: NEGATIVE
Protein, UA: NEGATIVE
Spec Grav, UA: 1.015
Urobilinogen, UA: NEGATIVE
pH, UA: 6

## 2015-03-19 LAB — T4, FREE: Free T4: 0.94 ng/dL (ref 0.60–1.60)

## 2015-03-19 LAB — VITAMIN B12: Vitamin B-12: 582 pg/mL (ref 211–911)

## 2015-03-19 LAB — VITAMIN D 25 HYDROXY (VIT D DEFICIENCY, FRACTURES): VITD: 27.44 ng/mL — ABNORMAL LOW (ref 30.00–100.00)

## 2015-03-19 NOTE — Patient Instructions (Signed)
Fatigue  Fatigue is feeling tired all of the time, a lack of energy, or a lack of motivation. Occasional or mild fatigue is often a normal response to activity or life in general. However, long-lasting (chronic) or extreme fatigue may indicate an underlying medical condition.  HOME CARE INSTRUCTIONS   Watch your fatigue for any changes. The following actions may help to lessen any discomfort you are feeling:  · Talk to your health care provider about how much sleep you need each night. Try to get the required amount every night.  · Take medicines only as directed by your health care provider.  · Eat a healthy and nutritious diet. Ask your health care provider if you need help changing your diet.  · Drink enough fluid to keep your urine clear or pale yellow.  · Practice ways of relaxing, such as yoga, meditation, massage therapy, or acupuncture.  · Exercise regularly.    · Change situations that cause you stress. Try to keep your work and personal routine reasonable.  · Do not abuse illegal drugs.  · Limit alcohol intake to no more than 1 drink per day for nonpregnant women and 2 drinks per day for men. One drink equals 12 ounces of beer, 5 ounces of wine, or 1½ ounces of hard liquor.  · Take a multivitamin, if directed by your health care provider.  SEEK MEDICAL CARE IF:   · Your fatigue does not get better.  · You have a fever.    · You have unintentional weight loss or gain.  · You have headaches.    · You have difficulty:      Falling asleep.    Sleeping throughout the night.  · You feel angry, guilty, anxious, or sad.     · You are unable to have a bowel movement (constipation).    · You skin is dry.     · Your legs or another part of your body is swollen.    SEEK IMMEDIATE MEDICAL CARE IF:   · You feel confused.    · Your vision is blurry.  · You feel faint or pass out.    · You have a severe headache.    · You have severe abdominal, pelvic, or back pain.    · You have chest pain, shortness of breath, or an  irregular or fast heartbeat.    · You are unable to urinate or you urinate less than normal.    · You develop abnormal bleeding, such as bleeding from the rectum, vagina, nose, lungs, or nipples.  · You vomit blood.     · You have thoughts about harming yourself or committing suicide.    · You are worried that you might harm someone else.       This information is not intended to replace advice given to you by your health care provider. Make sure you discuss any questions you have with your health care provider.     Document Released: 01/11/2007 Document Revised: 04/06/2014 Document Reviewed: 07/18/2013  Elsevier Interactive Patient Education ©2016 Else[employer] Inc[employer]

## 2015-03-19 NOTE — Progress Notes (Signed)
HPI  Pt presents to the clinic today with c/o a sight burning sensation at the urethra. This started 3-4 days ago. She is concerned that her UTI may be coming back. She was seen 11/30 by Dr. Caryl Bis. She was treated with Keflex for an Ecoli UTI. Her symptoms completely resolved and returned a few days ago. She denies fever, chills, nausea or low back pain, but reports she has been very fatigued. She has not tried anything OTC.   Review of Systems  Past Medical History  Diagnosis Date  . Hypothyroidism 10/29/1995  . Hypertension 10/29/1995  . Hyperlipidemia 01/29/1991  . Melanoma ([place])     h/o, local excision. no chemo or rady.   . Tubular adenoma of colon   . Hemorrhoids     internal and external  . Diverticulosis   . Skin cancer (melanoma) ([place])     Family History  Problem Relation Age of Onset  . Stroke Mother   . Heart disease Mother     CHF  . Stomach cancer Sister   . Cancer Brother     Metastatic cancer  . Colon cancer Neg Hx   . Breast cancer Neg Hx     Social History   Social History  . Marital Status: Married    Spouse Name: N/A  . Number of Children: 2  . Years of Education: N/A   Occupational History  . Retired     Technical brewer   Social History Main Topics  . Smoking status: Former Research scientist (life sciences)  . Smokeless tobacco: Never Used     Comment: quit 1994  . Alcohol Use: No  . Drug Use: No  . Sexual Activity: Not on file   Other Topics Concern  . Not on file   Social History Narrative   Married 1957 lives with husband    Allergies  Allergen Reactions  . Atorvastatin     CAUSE BURN AND TINGLING IN LEGS  . Celebrex [Celecoxib] Swelling    And rash  . Simvastatin     Myalgias    Constitutional: Pt reports fatigue. Denies fever, malaise, headache or abrupt weight changes.   GU: Pt reports burning sensation. Denies urgency, frequency,  pain with urination, blood in urine, odor or discharge. Skin: Denies redness, rashes, lesions or  ulcercations.   No other specific complaints in a complete review of systems (except as listed in HPI above).    Objective:   Physical Exam  BP 124/80 mmHg  Pulse 64  Temp(Src) 97.4 F (36.3 C) (Oral)  Wt 208 lb 8 oz (94.575 kg) Wt Readings from Last 3 Encounters:  03/19/15 208 lb 8 oz (94.575 kg)  03/04/15 209 lb (94.802 kg)  02/27/15 215 lb 3.2 oz (97.614 kg)    General: Appears her stated age, well developed, well nourished in NAD. Abdomen: Soft and nontender. Normal bowel sounds. No distention or masses noted.  No CVA tenderness.      Assessment & Plan:   Burning with urination:  ? cystitis Urinalysis: 1+ leuks Will send urine culture Push fluids No abx unless culture positive  Fatigue:  Check TSH, T4, B12 and Vit D today  RTC as needed or if symptoms persist.

## 2015-03-19 NOTE — Progress Notes (Signed)
Pre visit review using our clinic review tool, if applicable. No additional management support is needed unless otherwise documented below in the visit note. 

## 2015-03-20 LAB — URINE CULTURE
Colony Count: NO GROWTH
Organism ID, Bacteria: NO GROWTH

## 2015-04-11 ENCOUNTER — Other Ambulatory Visit: Payer: Self-pay | Admitting: *Deleted

## 2015-04-11 DIAGNOSIS — I10 Essential (primary) hypertension: Secondary | ICD-10-CM

## 2015-04-11 MED ORDER — LOSARTAN POTASSIUM-HCTZ 100-25 MG PO TABS: 1.0000 | ORAL_TABLET | Freq: Every day | ORAL | Status: DC

## 2015-05-28 ENCOUNTER — Other Ambulatory Visit: Payer: Self-pay | Admitting: Family Medicine

## 2015-05-28 DIAGNOSIS — I10 Essential (primary) hypertension: Secondary | ICD-10-CM

## 2015-06-05 ENCOUNTER — Ambulatory Visit (INDEPENDENT_AMBULATORY_CARE_PROVIDER_SITE_OTHER): Payer: Medicare Other

## 2015-06-05 ENCOUNTER — Other Ambulatory Visit (INDEPENDENT_AMBULATORY_CARE_PROVIDER_SITE_OTHER): Payer: Medicare Other

## 2015-06-05 VITALS — BP 132/84 | HR 63 | Temp 97.8°F | Ht 65.75 in | Wt 212.5 lb

## 2015-06-05 DIAGNOSIS — I10 Essential (primary) hypertension: Secondary | ICD-10-CM | POA: Diagnosis not present

## 2015-06-05 DIAGNOSIS — Z0000 Encounter for general adult medical examination without abnormal findings: Secondary | ICD-10-CM

## 2015-06-05 LAB — LIPID PANEL
Cholesterol: 261 mg/dL — ABNORMAL HIGH (ref 0–200)
HDL: 45.2 mg/dL (ref >39.00)
NonHDL: 215.67
Total CHOL/HDL Ratio: 6
Triglycerides: 225 mg/dL — ABNORMAL HIGH (ref 0.0–149.0)
VLDL: 45 mg/dL — ABNORMAL HIGH (ref 0.0–40.0)

## 2015-06-05 LAB — LDL CHOLESTEROL, DIRECT: Direct LDL: 156 mg/dL

## 2015-06-05 LAB — COMPREHENSIVE METABOLIC PANEL
ALT: 15 U/L (ref 0–35)
AST: 20 U/L (ref 0–37)
Albumin: 4.2 g/dL (ref 3.5–5.2)
Alkaline Phosphatase: 46 U/L (ref 39–117)
BUN: 24 mg/dL — ABNORMAL HIGH (ref 6–23)
CO2: 29 mEq/L (ref 19–32)
Calcium: 9.9 mg/dL (ref 8.4–10.5)
Chloride: 105 mEq/L (ref 96–112)
Creatinine, Ser: 1.28 mg/dL — ABNORMAL HIGH (ref 0.40–1.20)
GFR: 42.55 mL/min — ABNORMAL LOW (ref >60.00)
Glucose, Bld: 85 mg/dL (ref 70–99)
Potassium: 3.7 mEq/L (ref 3.5–5.1)
Sodium: 142 mEq/L (ref 135–145)
Total Bilirubin: 0.6 mg/dL (ref 0.2–1.2)
Total Protein: 6.9 g/dL (ref 6.0–8.3)

## 2015-06-05 NOTE — Progress Notes (Signed)
Subjective:   Lisa [name] is a 82 y.o. female who presents for Medicare Annual (Subsequent) preventive examination.   Cardiac Risk Factors include: advanced age (>93mn, >>11women);obesity (BMI >30kg/m2);dyslipidemia;hypertension     Objective:     Vitals: BP 132/84 mmHg  Pulse 63  Temp(Src) 97.8 F (36.6 C) (Oral)  Ht 5' 5.75" (1.67 m)  Wt 212 lb 8 oz (96.389 kg)  BMI 34.56 kg/m2  SpO2 98%  Tobacco History  Smoking status  . Former Smoker  Smokeless tobacco  . Never Used    Comment: quit 1994     Counseling given: No   Past Medical History  Diagnosis Date  . Hypothyroidism 10/29/1995  . Hypertension 10/29/1995  . Hyperlipidemia 01/29/1991  . Melanoma ([facility]     h/o, local excision. no chemo or rady.   . Tubular adenoma of colon   . Hemorrhoids     internal and external  . Diverticulosis   . Skin [facility] (melanoma) ([facility]    Past Surgical History  Procedure Laterality Date  . Tonsillectomy  1954  . Hernia repair  04/25/2001  . Septoplasty  2006    Dr. CErnesto Rutherford . Cataract surgery  2007, 2008    repair bilat  . Knee surgery      meniscus tear per Dr. LRonnie Derby R knee  . Vaginal hysterectomy    . Skin cancer excision     Family History  Problem Relation Age of Onset  . Stroke Mother   . Heart disease Mother     CHF  . Stomach cancer Sister   . Cancer Brother     Metastatic cancer  . Colon cancer Neg Hx   . Breast cancer Neg Hx    History  Sexual Activity  . Sexual Activity: No    Outpatient Encounter Prescriptions as of 06/05/2015  Medication Sig  . amLODipine (NORVASC) 10 MG tablet Take 1 tablet (10 mg total) by mouth daily.  .Marland Kitchenaspirin EC 81 MG tablet Take 1 tablet (81 mg total) by mouth daily.  .Marland Kitchenco-enzyme Q-10 30 MG capsule Take 90 mg by mouth daily.   . fluticasone (FLONASE) 50 MCG/ACT nasal spray USE 2 SPRAYS IN EACH NOSTRIL DAILY AS NEEDED FOR RHINITIS  . levothyroxine (SYNTHROID, LEVOTHROID) 50 MCG tablet Take 1 tablet (50 mcg total) by  mouth daily.  .Marland Kitchenlosartan-hydrochlorothiazide (HYZAAR) 100-25 MG tablet Take 1 tablet by mouth daily.  . Multiple Vitamin (MULTIVITAMIN) tablet Take 1 tablet by mouth daily.  .Marland KitchenOVER THE COUNTER MEDICATION   . docusate sodium (COLACE) 100 MG capsule Take 100 mg by mouth daily. Reported on 06/05/2015   No facility-administered encounter medications on file as of 06/05/2015.    Activities of Daily Living In your present state of health, do you have any difficulty performing the following activities: 06/05/2015  Hearing? Y  Vision? Y  Difficulty concentrating or making decisions? N  Walking or climbing stairs? N  Dressing or bathing? N  Doing errands, shopping? N  Preparing Food and eating ? N  Using the Toilet? N  In the past six months, have you accidently leaked urine? N  Do you have problems with loss of bowel control? N  Managing your Medications? N  Managing your Finances? N  Housekeeping or managing your Housekeeping? N    Patient Care Team: GTonia Ghent MD as PCP - General (Family Medicine)  Dr. SKathrin Penner- Optometry Dr. SFuller Plan- GI Dr. CBurt Knack- Cardiology Provider list updated  Assessment:     Exercise Activities and Dietary recommendations Current Exercise Habits: The patient does not participate in regular exercise at present (swim; yard work approx. 1 hr per week), Exercise limited by: None identified  Goals    . Reduce sugar intake to X grams per day     Starting 06/06/2015, I will decrease intake of chocolate to 2 nights per week.       Fall Risk Fall Risk  06/05/2015 05/30/2013 05/19/2012  Falls in the past year? No No No   Depression Screen PHQ 2/9 Scores 06/05/2015 05/30/2013 05/19/2012  PHQ - 2 Score 0 0 0     Cognitive Testing MMSE - Mini Mental State Exam 06/05/2015  Orientation to time 5  Orientation to Place 5  Registration 3  Attention/ Calculation 5  Recall 3  Language- name 2 objects 0  Language- repeat 1  Language- follow 3 step command 3    Language- read & follow direction 1  Write a sentence 0  Copy design 0  Total score 26    Immunization History  Administered Date(s) Administered  . DTaP 11/30/2012  . Influenza Split 12/30/2010, 12/18/2011  . Influenza Whole 01/28/2007, 12/26/2007, 01/02/2009, 12/19/2009  . Influenza,inj,Quad PF,36+ Mos 01/05/2013, 02/11/2015  . Pneumococcal Conjugate-13 06/07/2014  . Pneumococcal Polysaccharide-23 08/12/1999  . Td 10/07/1988, 05/14/2009  . Zoster 08/11/2006   Screening Tests Health Maintenance  Topic Date Due  . INFLUENZA VACCINE  10/29/2015  . TETANUS/TDAP  11/29/2022  . DEXA SCAN  Completed  . ZOSTAVAX  Completed  . PNA vac Low Risk Adult  Completed    Hearing Screening   '125Hz'$  '250Hz'$  '500Hz'$  '1000Hz'$  '2000Hz'$  '4000Hz'$  '8000Hz'$   Right ear:   40 40 40 40   Left ear:   0 0 40 40    Vision Screening Comments: Last eye exam with Dr. Kathrin Penner in 03/2015     Plan:     I have personally reviewed the Medicare Annual Wellness questionnaire and have noted the following in the patient's chart:  A. Medical and social history B. Use of alcohol, tobacco or illicit drugs  C. Current medications and supplements D. Functional ability and status E.  Nutritional status F.  Physical activity G. Advance directives H. List of other physicians I.  Hospitalizations, surgeries, and ER visits in previous 12 months J.  [place] to include hearing, vision, cognitive, depression L. Referrals, if needed  In addition, I reviewed preventive protocols, quality metrics, and best practice recommendations specific to patient. A written personalized care plan for preventive services as well as general preventive health recommendations were provided to patient.  See attached scanned questionnaire for additional information.   Signed,   Lindell Noe, MHA, BS, LPN Health Advisor 04/06/4079  I reviewed health advisor's note, was available for consultation, and agree with documentation and  plan.

## 2015-06-05 NOTE — Patient Instructions (Signed)
Lisa [name] , Thank you for taking time to come for your Medicare Wellness Visit. I appreciate your ongoing commitment to your health goals. Please review the following plan we discussed and let me know if I can assist you in the future.   These are the goals we discussed: Goals    . Reduce sugar intake to X grams per day     Starting 06/06/2015, I will decrease intake of chocolate to 2 nights per week.        This is a list of the screening recommended for you and due dates:  Health Maintenance  Topic Date Due  . Flu Shot  10/29/2015  . Tetanus Vaccine  11/29/2022  . DEXA scan (bone density measurement)  Completed  . Shingles Vaccine  Completed  . Pneumonia vaccines  Completed   Preventive Care for Women  A healthy lifestyle and preventive care can promote health and wellness. Preventive health guidelines for women include the following key practices.  . A routine yearly physical is a good way to check with your health care provider about your health and preventive screening. It is a chance to share any concerns and updates on your health and to receive a thorough exam.  . Visit your dentist for a routine exam and preventive care every 6 months. Brush your teeth twice a day and floss once a day. Good oral hygiene prevents tooth decay and gum disease.  . The frequency of eye exams is based on your age, health, family medical history, use  of contact lenses, and other factors. Follow your health care provider's ecommendations for frequency of eye exams.  . Eat a healthy diet. Foods like vegetables, fruits, whole grains, low-fat dairy products, and lean protein foods contain the nutrients you need without too many calories. Decrease your intake of foods high in solid fats, added sugars, and salt. Eat the right amount of calories for you. Get information about a proper diet from your health care provider, if necessary.  . Regular physical exercise is one of the most important things you  can do for your health. Most adults should get at least 150 minutes of moderate-intensity exercise (any activity that increases your heart rate and causes you to sweat) each week. In addition, most adults need muscle-strengthening exercises on 2 or more days a week.  . Maintain a healthy weight. The body mass index (BMI) is a screening tool to identify possible weight problems. It provides an estimate of body fat based on height and weight. Your health care provider can find your BMI and can help you achieve or maintain a healthy weight.   For adults 20 years and older: ? A BMI below 18.5 is considered underweight. ? A BMI of 18.5 to 24.9 is normal. ? A BMI of 25 to 29.9 is considered overweight. ? A BMI of 30 and above is considered obese.   . Maintain normal blood lipids and cholesterol levels by exercising and minimizing your intake of saturated fat. Eat a balanced diet with plenty of fruit and vegetables. Blood tests for lipids and cholesterol should begin at age 37 and be repeated every 5 years. If your lipid or cholesterol levels are high, you are over 50, or you are at high risk for heart disease, you may need your cholesterol levels checked more frequently. Ongoing high lipid and cholesterol levels should be treated with medicines if diet and exercise are not working.  . If you smoke, find out from your health  care provider how to quit. If you do not use tobacco, please do not start.  . If you choose to drink alcohol, please do not consume more than 2 drinks per day. One drink is considered to be 12 ounces (355 mL) of beer, 5 ounces (148 mL) of wine, or 1.5 ounces (44 mL) of liquor.  . If you are 46-30 years old, ask your health care provider if you should take aspirin to prevent strokes.  . Osteoporosis is a disease in which the bones lose minerals and strength with aging. This can result in serious bone fractures or breaks. The risk of osteoporosis can be identified using a bone density  scan. Women ages 2 years and over and women at risk for fractures or osteoporosis should discuss screening with their health care providers. Ask your health care provider whether you should take a calcium supplement or vitamin D to reduce the rate of osteoporosis.  . Menopause can be associated with physical symptoms and risks. Hormone replacement therapy is available to decrease symptoms and risks. You should talk to your health care provider about whether hormone replacement therapy is right for you.  . Use sunscreen. Apply sunscreen liberally and repeatedly throughout the day. You should seek shade when your shadow is shorter than you. Protect yourself by wearing long sleeves, pants, a wide-brimmed hat, and sunglasses year round, whenever you are outdoors.  . Once a month, do a whole body skin exam, using a mirror to look at the skin on your back. Tell your health care provider of new moles, moles that have irregular borders, moles that are larger than a pencil eraser, or moles that have changed in shape or color.

## 2015-06-05 NOTE — Progress Notes (Signed)
Pre visit review using our clinic review tool, if applicable. No additional management support is needed unless otherwise documented below in the visit note. 

## 2015-06-09 NOTE — Progress Notes (Signed)
   Subjective:    Patient ID: Lisa [name], female    DOB: 1933/01/18, 82 y.o.   MRN: [id number]  HPI    Review of Systems     Objective:   Physical Exam        Assessment & Plan:  I reviewed health advisor's note, was available for consultation, and agree with documentation and plan.

## 2015-06-11 ENCOUNTER — Ambulatory Visit (INDEPENDENT_AMBULATORY_CARE_PROVIDER_SITE_OTHER): Payer: Medicare Other | Admitting: Family Medicine

## 2015-06-11 ENCOUNTER — Encounter: Payer: Self-pay | Admitting: Family Medicine

## 2015-06-11 VITALS — BP 122/80 | HR 71 | Temp 98.1°F | Ht 66.0 in | Wt 211.2 lb

## 2015-06-11 DIAGNOSIS — E039 Hypothyroidism, unspecified: Secondary | ICD-10-CM | POA: Diagnosis not present

## 2015-06-11 DIAGNOSIS — I10 Essential (primary) hypertension: Secondary | ICD-10-CM

## 2015-06-11 MED ORDER — AMLODIPINE BESYLATE 10 MG PO TABS: 10.0000 mg | ORAL_TABLET | Freq: Every day | ORAL | Status: DC

## 2015-06-11 MED ORDER — LEVOTHYROXINE SODIUM 50 MCG PO TABS: 50.0000 ug | ORAL_TABLET | Freq: Every day | ORAL | Status: DC

## 2015-06-11 MED ORDER — FLUTICASONE PROPIONATE 50 MCG/ACT NA SUSP: NASAL | Status: DC

## 2015-06-11 MED ORDER — LOSARTAN POTASSIUM-HCTZ 100-25 MG PO TABS: 1.0000 | ORAL_TABLET | Freq: Every day | ORAL | Status: DC

## 2015-06-11 NOTE — Assessment & Plan Note (Signed)
Controlled, Cr reasonable, lytes okay.  Continue work on diet and exercise for lipids given her statin intolerance.  Labs d/w pt.  She agrees.

## 2015-06-11 NOTE — Assessment & Plan Note (Signed)
tsh wnl, continue as is. No tmg.  Patient agrees.

## 2015-06-11 NOTE — Progress Notes (Signed)
Pre visit review using our clinic review tool, if applicable. No additional management support is needed unless otherwise documented below in the visit note.  Hypothyroid.  No sx other than some mild fatigue. No dysphagia.  Compliant with med.  Prev TSH wnl, d/w pt.   Hypertension:    Using medication without problems or lightheadedness: yes Chest pain with exertion:no Edema: minimal, improved from prev Short of breath:no TG up, d/w pt.  She hasn't been exercising as much.  D/w pt about diet and exercise.    Meds, vitals, and allergies reviewed.   ROS: See HPI.  Otherwise, noncontributory.  GEN: nad, alert and oriented HEENT: mucous membranes moist NECK: supple w/o LA, no tmg CV: rrr PULM: ctab, no inc wob ABD: soft, +bs EXT: no edema

## 2015-06-11 NOTE — Patient Instructions (Signed)
Take care.  Glad to see you.  If you have concerns then let me know.

## 2015-12-16 ENCOUNTER — Other Ambulatory Visit: Payer: Self-pay | Admitting: Family Medicine

## 2015-12-16 DIAGNOSIS — Z1231 Encounter for screening mammogram for malignant neoplasm of breast: Secondary | ICD-10-CM

## 2015-12-17 ENCOUNTER — Encounter: Payer: Self-pay | Admitting: Family Medicine

## 2015-12-17 ENCOUNTER — Ambulatory Visit (INDEPENDENT_AMBULATORY_CARE_PROVIDER_SITE_OTHER): Payer: Medicare Other | Admitting: Family Medicine

## 2015-12-17 ENCOUNTER — Telehealth: Payer: Self-pay | Admitting: Family Medicine

## 2015-12-17 DIAGNOSIS — L03119 Cellulitis of unspecified part of limb: Secondary | ICD-10-CM | POA: Diagnosis not present

## 2015-12-17 MED ORDER — CEPHALEXIN 500 MG PO CAPS: 500.0000 mg | ORAL_CAPSULE | Freq: Four times a day (QID) | ORAL | 0 refills | Status: DC

## 2015-12-17 NOTE — Progress Notes (Signed)
L>R leg redness.  Noted yesterday.  Some better today.  Lower leg only, not on the upper legs.  Locally warm and tender.  No ankle or knee troubles.  She is some better today.  No FCNAVD.  She doesn't feel sick but is tired.  No cough, no vomiting, no diarrhea.  No trigger or injury to explain the leg sx.  No sx like this prev.  H/o some BLE edema at baseline.    Meds, vitals, and allergies reviewed.   ROS: Per HPI unless specifically indicated in ROS section   nad ncat rrr ctab abd soft L>R BLE edema with redness and tenderness.   41cm calf B Old healed wound on L medial shin.  Feet not puffy, normal DP pulses.

## 2015-12-17 NOTE — Progress Notes (Signed)
Pre visit review using our clinic review tool, if applicable. No additional management support is needed unless otherwise documented below in the visit note. 

## 2015-12-17 NOTE — Telephone Encounter (Signed)
Patient Name: Lisa [name]  DOB: 1932-06-15    Initial Comment caller states she has cellulitis in her legs but left leg is worse   Nurse Assessment  Nurse: Leilani Merl, RN, Heather Date/Time (Eastern Time): 12/17/2015 9:59:31 AM  Confirm and document reason for call. If symptomatic, describe symptoms. You must click the next button to save text entered. ---caller states she has cellulitis in her legs but left leg is worse, it is a large area on both legs, it is red, swollen and hot to the touch.  Has the patient traveled out of the country within the last 30 days? ---Not Applicable  Does the patient have any new or worsening symptoms? ---Yes  Will a triage be completed? ---Yes  Related visit to physician within the last 2 weeks? ---No  Does the PT have any chronic conditions? (i.e. diabetes, asthma, etc.) ---Yes  List chronic conditions. ---See MR  Is this a behavioral health or substance abuse call? ---No     Guidelines    Guideline Title Affirmed Question Affirmed Notes  Leg Swelling and Edema [1] Red area or streak [2] large (> 2 in. or 5 cm)    Final Disposition User   See Physician within 4 Hours (or PCP triage) Standifer, RN, Nira Conn    Comments  No appts available at either office, caller does not want to go to an [facility] or the ED, she wants a doctor to work her in today. Please call her and let her know if anything can be done.   Referrals  REFERRED TO PCP OFFICE   Disagree/Comply: Comply

## 2015-12-17 NOTE — Patient Instructions (Signed)
Keflex 4 times a day.  Elevate legs.  Update me Thursday AM, sooner if needed.  Take care.  Glad to see you.

## 2015-12-17 NOTE — Telephone Encounter (Signed)
I spoke with pt and she does want to be seen today. Dr Damita Dunnings agreed to add pt to his schedule 12/17/15 at 3:45. Pt voiced understanding.

## 2015-12-18 DIAGNOSIS — L03119 Cellulitis of unspecified part of limb: Secondary | ICD-10-CM | POA: Insufficient documentation

## 2015-12-18 NOTE — Assessment & Plan Note (Signed)
I think she likely had some chronic changes to the lower extremities, with some swelling at baseline, and this subsequently got infected. Discussed with patient. Would start Keflex. Elevate legs. Update me as needed. See after visit summary. Okay for outpatient follow-up. Not ill appearing.

## 2015-12-19 ENCOUNTER — Telehealth: Payer: Self-pay

## 2015-12-19 NOTE — Telephone Encounter (Signed)
If not as red, then I would take this as improvement. I would continue as is. If she gets worse in the meantime please let me know. Otherwise finished antibiotics. Thanks.

## 2015-12-19 NOTE — Telephone Encounter (Signed)
Left detailed message on voicemail.  

## 2015-12-19 NOTE — Telephone Encounter (Signed)
Pt left v/m; pt was seen 12/17/15. Pt was to call with update on legs. Pt's legs are not as red but still feels warm and sore. Please advise.

## 2015-12-20 ENCOUNTER — Ambulatory Visit (INDEPENDENT_AMBULATORY_CARE_PROVIDER_SITE_OTHER): Payer: Medicare Other

## 2015-12-20 DIAGNOSIS — Z23 Encounter for immunization: Secondary | ICD-10-CM | POA: Diagnosis not present

## 2016-01-20 ENCOUNTER — Ambulatory Visit
Admission: RE | Admit: 2016-01-20 | Discharge: 2016-01-20 | Disposition: A | Discharge status: Home / Self Care | Payer: Medicare Other | Source: Ambulatory Visit | Attending: Family Medicine | Admitting: Family Medicine

## 2016-01-20 DIAGNOSIS — Z1231 Encounter for screening mammogram for malignant neoplasm of breast: Secondary | ICD-10-CM

## 2016-04-23 DIAGNOSIS — H52223 Regular astigmatism, bilateral: Secondary | ICD-10-CM | POA: Diagnosis not present

## 2016-06-08 ENCOUNTER — Other Ambulatory Visit: Payer: Self-pay | Admitting: Family Medicine

## 2016-06-08 DIAGNOSIS — E559 Vitamin D deficiency, unspecified: Secondary | ICD-10-CM

## 2016-06-08 DIAGNOSIS — I10 Essential (primary) hypertension: Secondary | ICD-10-CM

## 2016-06-11 ENCOUNTER — Other Ambulatory Visit: Payer: Medicare Other

## 2016-06-11 ENCOUNTER — Other Ambulatory Visit (INDEPENDENT_AMBULATORY_CARE_PROVIDER_SITE_OTHER): Payer: Medicare Other

## 2016-06-11 ENCOUNTER — Encounter (INDEPENDENT_AMBULATORY_CARE_PROVIDER_SITE_OTHER): Payer: Self-pay

## 2016-06-11 DIAGNOSIS — E559 Vitamin D deficiency, unspecified: Secondary | ICD-10-CM

## 2016-06-11 DIAGNOSIS — I10 Essential (primary) hypertension: Secondary | ICD-10-CM

## 2016-06-11 LAB — COMPREHENSIVE METABOLIC PANEL
ALT: 15 U/L (ref 0–35)
AST: 22 U/L (ref 0–37)
Albumin: 4.2 g/dL (ref 3.5–5.2)
Alkaline Phosphatase: 44 U/L (ref 39–117)
BUN: 31 mg/dL — ABNORMAL HIGH (ref 6–23)
CO2: 28 mEq/L (ref 19–32)
Calcium: 10.1 mg/dL (ref 8.4–10.5)
Chloride: 106 mEq/L (ref 96–112)
Creatinine, Ser: 1.51 mg/dL — ABNORMAL HIGH (ref 0.40–1.20)
GFR: 35.08 mL/min — ABNORMAL LOW (ref >60.00)
Glucose, Bld: 88 mg/dL (ref 70–99)
Potassium: 3.5 mEq/L (ref 3.5–5.1)
Sodium: 144 mEq/L (ref 135–145)
Total Bilirubin: 0.5 mg/dL (ref 0.2–1.2)
Total Protein: 7.1 g/dL (ref 6.0–8.3)

## 2016-06-11 LAB — VITAMIN D 25 HYDROXY (VIT D DEFICIENCY, FRACTURES): VITD: 23.92 ng/mL — ABNORMAL LOW (ref 30.00–100.00)

## 2016-06-11 LAB — LIPID PANEL
Cholesterol: 253 mg/dL — ABNORMAL HIGH (ref 0–200)
HDL: 48.4 mg/dL (ref >39.00)
LDL Cholesterol: 174 mg/dL — ABNORMAL HIGH (ref 0–99)
NonHDL: 204.79
Total CHOL/HDL Ratio: 5
Triglycerides: 154 mg/dL — ABNORMAL HIGH (ref 0.0–149.0)
VLDL: 30.8 mg/dL (ref 0.0–40.0)

## 2016-06-11 LAB — TSH: TSH: 5.15 u[IU]/mL — ABNORMAL HIGH (ref 0.35–4.50)

## 2016-06-18 ENCOUNTER — Encounter: Payer: Self-pay | Admitting: Family Medicine

## 2016-06-18 ENCOUNTER — Ambulatory Visit (INDEPENDENT_AMBULATORY_CARE_PROVIDER_SITE_OTHER): Payer: Medicare Other | Admitting: Family Medicine

## 2016-06-18 VITALS — BP 132/78 | HR 64 | Temp 97.8°F | Ht 65.0 in | Wt 211.5 lb

## 2016-06-18 DIAGNOSIS — E039 Hypothyroidism, unspecified: Secondary | ICD-10-CM

## 2016-06-18 DIAGNOSIS — Z7189 Other specified counseling: Secondary | ICD-10-CM

## 2016-06-18 DIAGNOSIS — E785 Hyperlipidemia, unspecified: Secondary | ICD-10-CM | POA: Diagnosis not present

## 2016-06-18 DIAGNOSIS — E559 Vitamin D deficiency, unspecified: Secondary | ICD-10-CM

## 2016-06-18 DIAGNOSIS — Z0000 Encounter for general adult medical examination without abnormal findings: Secondary | ICD-10-CM

## 2016-06-18 DIAGNOSIS — R7989 Other specified abnormal findings of blood chemistry: Secondary | ICD-10-CM | POA: Diagnosis not present

## 2016-06-18 DIAGNOSIS — I10 Essential (primary) hypertension: Secondary | ICD-10-CM | POA: Diagnosis not present

## 2016-06-18 LAB — BASIC METABOLIC PANEL
BUN: 26 mg/dL — ABNORMAL HIGH (ref 6–23)
CO2: 29 mEq/L (ref 19–32)
Calcium: 9.7 mg/dL (ref 8.4–10.5)
Chloride: 106 mEq/L (ref 96–112)
Creatinine, Ser: 1.42 mg/dL — ABNORMAL HIGH (ref 0.40–1.20)
GFR: 37.65 mL/min — ABNORMAL LOW (ref >60.00)
Glucose, Bld: 94 mg/dL (ref 70–99)
Potassium: 4 mEq/L (ref 3.5–5.1)
Sodium: 142 mEq/L (ref 135–145)

## 2016-06-18 MED ORDER — VITAMIN D3 25 MCG (1000 UNIT) PO TABS: 1000.0000 [IU] | ORAL_TABLET | Freq: Every day | ORAL | Status: DC

## 2016-06-18 NOTE — Progress Notes (Signed)
I have personally reviewed the Medicare Annual Wellness questionnaire and have noted 1. The patient's medical and social history 2. Their use of alcohol, tobacco or illicit drugs 3. Their current medications and supplements 4. The patient's functional ability including ADL's, fall risks, home safety risks and hearing or visual             impairment. 5. Diet and physical activities 6. Evidence for depression or mood disorders  The patients weight, height, BMI have been recorded in the chart and visual acuity is per eye clinic.  I have made referrals, counseling and provided education to the patient based review of the above and I have provided the pt with a written personalized care plan for preventive services.  Provider list updated- see scanned forms.  Routine anticipatory guidance given to patient.  See health maintenance. The possibility exists that previously documented standard health maintenance information may have been brought forward from a previous encounter into this note.  If needed, that same information has been updated to reflect the current situation based on today's encounter.    Flu 2017 Shingles 2008 PNA UTD Tetanus 2011 Colonoscopy NA due to age Breast cancer screening 2017 DXA declined for now, d/w pt.   Advance directive- Granddaughter Geryl Rankins designated if patient were incapacitated.   Cognitive function addressed- see scanned forms- and if abnormal then additional documentation follows.   Hypertension:    Using medication without problems or lightheadedness: yes Chest pain with exertion:no Edema:at baseline- BLE, going on for years.   Short of breath:no  Mild inc in Cr.  D/w pt. Blood pressure meds noted, with potential renal effect. Discussed with patient about avoiding NSAIDs. She does not take NSAIDs frequently. Follow-up labs pending. See notes on labs.  Vitamin D deficiency discussed with patient. Not on replacement. Labs discussed with patient.    Hypothyroidism.  No neck mass, no dysphagia. TSH mildly elevated.  D/w pt.    Statin intolerant HLD.    PMH and SH reviewed  Meds, vitals, and allergies reviewed.   ROS: Per HPI.  Unless specifically indicated otherwise in HPI, the patient denies:  General: fever. Eyes: acute vision changes ENT: sore throat Cardiovascular: chest pain Respiratory: SOB GI: vomiting GU: dysuria Musculoskeletal: acute back pain Derm: acute rash Neuro: acute motor dysfunction Psych: worsening mood Endocrine: polydipsia Heme: bleeding Allergy: hayfever  GEN: nad, alert and oriented HEENT: mucous membranes moist NECK: supple w/o LA, no tmg CV: rrr. PULM: ctab, no inc wob ABD: soft, +bs EXT: trace BLE edema SKIN: no acute rash

## 2016-06-18 NOTE — Progress Notes (Signed)
Pre visit review using our clinic review tool, if applicable. No additional management support is needed unless otherwise documented below in the visit note. 

## 2016-06-18 NOTE — Assessment & Plan Note (Signed)
Advance directive- Granddaughter Geryl Rankins designated if patient were incapacitated.

## 2016-06-18 NOTE — Patient Instructions (Signed)
Go to the lab on the way out.  We'll contact you with your lab report. Don't change your regular meds for now but add on vitamin D.  Recheck other labs in about 4 months.  nonfasting lab visit.  Take care.  Glad to see you.  I'll send refills to [facility] when I see your labs.

## 2016-06-19 ENCOUNTER — Other Ambulatory Visit: Payer: Self-pay | Admitting: Family Medicine

## 2016-06-19 DIAGNOSIS — I10 Essential (primary) hypertension: Secondary | ICD-10-CM

## 2016-06-19 DIAGNOSIS — E559 Vitamin D deficiency, unspecified: Secondary | ICD-10-CM | POA: Insufficient documentation

## 2016-06-19 DIAGNOSIS — R7989 Other specified abnormal findings of blood chemistry: Secondary | ICD-10-CM | POA: Insufficient documentation

## 2016-06-19 MED ORDER — LOSARTAN POTASSIUM-HCTZ 100-25 MG PO TABS
1.0000 | ORAL_TABLET | Freq: Every day | ORAL | 3 refills | Status: DC
Start: 2016-06-19 — End: 2017-06-17

## 2016-06-19 MED ORDER — AMLODIPINE BESYLATE 10 MG PO TABS: 10.0000 mg | ORAL_TABLET | Freq: Every day | ORAL | 3 refills | Status: DC

## 2016-06-19 MED ORDER — FLUTICASONE PROPIONATE 50 MCG/ACT NA SUSP: NASAL | 3 refills | Status: DC

## 2016-06-19 MED ORDER — LEVOTHYROXINE SODIUM 50 MCG PO TABS: 50.0000 ug | ORAL_TABLET | Freq: Every day | ORAL | 3 refills | Status: DC

## 2016-06-19 NOTE — Assessment & Plan Note (Signed)
Labs discussed with patient. Statin intolerant. Continue work on diet and exercise. She agrees.

## 2016-06-19 NOTE — Assessment & Plan Note (Signed)
Controlled. Continue as is. Labs discussed with patient. She agrees.

## 2016-06-19 NOTE — Assessment & Plan Note (Signed)
Restart replacement. Recheck level in about 4 months.

## 2016-06-19 NOTE — Assessment & Plan Note (Signed)
Flu 2017 Shingles 2008 PNA UTD Tetanus 2011 Colonoscopy NA due to age Breast cancer screening 2017 DXA declined for now, d/w pt.   Advance directive- Granddaughter Geryl Rankins designated if patient were incapacitated.   Cognitive function addressed- see scanned forms- and if abnormal then additional documentation follows.

## 2016-06-19 NOTE — Assessment & Plan Note (Signed)
Minimal elevation in TSH. We can recheck in about 4 months with other labs. Continue as is. She agrees.

## 2016-06-19 NOTE — Assessment & Plan Note (Signed)
See notes on follow-up labs. 

## 2016-10-19 ENCOUNTER — Other Ambulatory Visit (INDEPENDENT_AMBULATORY_CARE_PROVIDER_SITE_OTHER): Payer: Medicare Other

## 2016-10-19 DIAGNOSIS — E559 Vitamin D deficiency, unspecified: Secondary | ICD-10-CM

## 2016-10-19 DIAGNOSIS — E039 Hypothyroidism, unspecified: Secondary | ICD-10-CM | POA: Diagnosis not present

## 2016-10-19 LAB — VITAMIN D 25 HYDROXY (VIT D DEFICIENCY, FRACTURES): VITD: 19.14 ng/mL — ABNORMAL LOW (ref 30.00–100.00)

## 2016-10-19 LAB — TSH: TSH: 3.91 u[IU]/mL (ref 0.35–4.50)

## 2016-10-21 ENCOUNTER — Other Ambulatory Visit: Payer: Self-pay | Admitting: Family Medicine

## 2016-10-21 ENCOUNTER — Telehealth: Payer: Self-pay | Admitting: Family Medicine

## 2016-10-21 DIAGNOSIS — E559 Vitamin D deficiency, unspecified: Secondary | ICD-10-CM

## 2016-10-21 MED ORDER — VITAMIN D (ERGOCALCIFEROL) 1.25 MG (50000 UNIT) PO CAPS: 50000.0000 [IU] | ORAL_CAPSULE | ORAL | 0 refills | Status: DC

## 2016-10-21 NOTE — Telephone Encounter (Signed)
Left message on voicemail for patient to call back. See result note.

## 2016-10-21 NOTE — Telephone Encounter (Signed)
Patient returned Regina's call.

## 2016-11-05 ENCOUNTER — Telehealth: Payer: Self-pay

## 2016-11-05 NOTE — Telephone Encounter (Signed)
Patient advised.

## 2016-11-05 NOTE — Telephone Encounter (Signed)
I wouldn't think this was due to the vit D but I would hold it a few days and if sx persist then we need to check her at the clinic.  Thanks.

## 2016-11-05 NOTE — Telephone Encounter (Signed)
Lisa [name] pts granddaughter left v/m and request cb to pt. on 11/04/16. Pt was prescribed Vit D to be taken weekly. Since taking Vit D pt has lost 8 lbs,tired more than usual and high anxiety. Should pt continue taking Vit D. Pt is due to take Vit D on 11/04/16; Computers were down and I checked with Dr Damita Dunnings and he said to hold Vit D on 11/04/16 and pt was to cb on 11/05/16. Pt was advised and voiced understanding.Please advise.

## 2016-12-28 ENCOUNTER — Other Ambulatory Visit: Payer: Self-pay | Admitting: Family Medicine

## 2016-12-28 DIAGNOSIS — Z1231 Encounter for screening mammogram for malignant neoplasm of breast: Secondary | ICD-10-CM

## 2017-01-08 ENCOUNTER — Ambulatory Visit (INDEPENDENT_AMBULATORY_CARE_PROVIDER_SITE_OTHER): Payer: Medicare Other

## 2017-01-08 DIAGNOSIS — Z23 Encounter for immunization: Secondary | ICD-10-CM | POA: Diagnosis not present

## 2017-01-21 ENCOUNTER — Ambulatory Visit
Admission: RE | Admit: 2017-01-21 | Discharge: 2017-01-21 | Disposition: A | Discharge status: Home / Self Care | Payer: Medicare Other | Source: Ambulatory Visit | Attending: Family Medicine | Admitting: Family Medicine

## 2017-01-21 ENCOUNTER — Other Ambulatory Visit (INDEPENDENT_AMBULATORY_CARE_PROVIDER_SITE_OTHER): Payer: Medicare Other

## 2017-01-21 DIAGNOSIS — Z1231 Encounter for screening mammogram for malignant neoplasm of breast: Secondary | ICD-10-CM

## 2017-01-21 DIAGNOSIS — E559 Vitamin D deficiency, unspecified: Secondary | ICD-10-CM

## 2017-01-21 LAB — VITAMIN D 25 HYDROXY (VIT D DEFICIENCY, FRACTURES): VITD: 26.34 ng/mL — ABNORMAL LOW (ref 30.00–100.00)

## 2017-01-25 ENCOUNTER — Other Ambulatory Visit: Payer: Self-pay | Admitting: Family Medicine

## 2017-01-25 DIAGNOSIS — R7989 Other specified abnormal findings of blood chemistry: Secondary | ICD-10-CM

## 2017-01-25 MED ORDER — VITAMIN D (ERGOCALCIFEROL) 1.25 MG (50000 UNIT) PO CAPS: 50000.0000 [IU] | ORAL_CAPSULE | ORAL | 0 refills | Status: DC

## 2017-01-27 ENCOUNTER — Other Ambulatory Visit: Payer: Self-pay | Admitting: Family Medicine

## 2017-04-23 DIAGNOSIS — H26491 Other secondary cataract, right eye: Secondary | ICD-10-CM | POA: Diagnosis not present

## 2017-04-23 DIAGNOSIS — Z961 Presence of intraocular lens: Secondary | ICD-10-CM | POA: Diagnosis not present

## 2017-04-23 DIAGNOSIS — D3132 Benign neoplasm of left choroid: Secondary | ICD-10-CM | POA: Diagnosis not present

## 2017-04-23 DIAGNOSIS — H43813 Vitreous degeneration, bilateral: Secondary | ICD-10-CM | POA: Diagnosis not present

## 2017-06-15 ENCOUNTER — Other Ambulatory Visit: Payer: Self-pay | Admitting: Family Medicine

## 2017-06-15 ENCOUNTER — Ambulatory Visit (INDEPENDENT_AMBULATORY_CARE_PROVIDER_SITE_OTHER): Payer: Medicare Other | Admitting: Family Medicine

## 2017-06-15 ENCOUNTER — Other Ambulatory Visit (INDEPENDENT_AMBULATORY_CARE_PROVIDER_SITE_OTHER): Payer: Medicare Other

## 2017-06-15 ENCOUNTER — Encounter: Payer: Self-pay | Admitting: Family Medicine

## 2017-06-15 VITALS — BP 124/80 | HR 75 | Temp 98.3°F | Ht 65.0 in | Wt 200.5 lb

## 2017-06-15 DIAGNOSIS — I10 Essential (primary) hypertension: Secondary | ICD-10-CM

## 2017-06-15 DIAGNOSIS — E559 Vitamin D deficiency, unspecified: Secondary | ICD-10-CM | POA: Diagnosis not present

## 2017-06-15 DIAGNOSIS — Z780 Asymptomatic menopausal state: Secondary | ICD-10-CM

## 2017-06-15 DIAGNOSIS — E039 Hypothyroidism, unspecified: Secondary | ICD-10-CM | POA: Diagnosis not present

## 2017-06-15 DIAGNOSIS — Z0000 Encounter for general adult medical examination without abnormal findings: Secondary | ICD-10-CM | POA: Diagnosis not present

## 2017-06-15 LAB — LIPID PANEL
Cholesterol: 234 mg/dL — ABNORMAL HIGH (ref 0–200)
HDL: 47.1 mg/dL (ref >39.00)
LDL Cholesterol: 158 mg/dL — ABNORMAL HIGH (ref 0–99)
NonHDL: 186.68
Total CHOL/HDL Ratio: 5
Triglycerides: 143 mg/dL (ref 0.0–149.0)
VLDL: 28.6 mg/dL (ref 0.0–40.0)

## 2017-06-15 LAB — TSH: TSH: 5.48 u[IU]/mL — ABNORMAL HIGH (ref 0.35–4.50)

## 2017-06-15 LAB — COMPREHENSIVE METABOLIC PANEL
ALT: 10 U/L (ref 0–35)
AST: 17 U/L (ref 0–37)
Albumin: 4 g/dL (ref 3.5–5.2)
Alkaline Phosphatase: 56 U/L (ref 39–117)
BUN: 22 mg/dL (ref 6–23)
CO2: 30 mEq/L (ref 19–32)
Calcium: 10.3 mg/dL (ref 8.4–10.5)
Chloride: 104 mEq/L (ref 96–112)
Creatinine, Ser: 1.51 mg/dL — ABNORMAL HIGH (ref 0.40–1.20)
GFR: 34.99 mL/min — ABNORMAL LOW (ref >60.00)
Glucose, Bld: 87 mg/dL (ref 70–99)
Potassium: 3.8 mEq/L (ref 3.5–5.1)
Sodium: 142 mEq/L (ref 135–145)
Total Bilirubin: 0.5 mg/dL (ref 0.2–1.2)
Total Protein: 7.1 g/dL (ref 6.0–8.3)

## 2017-06-15 LAB — VITAMIN D 25 HYDROXY (VIT D DEFICIENCY, FRACTURES): VITD: 34.21 ng/mL (ref 30.00–100.00)

## 2017-06-15 NOTE — Patient Instructions (Addendum)
Rosaria Ferries will call about your referral for the bone density test.   Check with the pharmacy about your losartan.  Take care.  Glad to see you.  Update me as needed.   I'll send your refills after I see your labs.

## 2017-06-15 NOTE — Progress Notes (Signed)
I have personally reviewed the Medicare Annual Wellness questionnaire and have noted 1. The patient's medical and social history 2. Their use of alcohol, tobacco or illicit drugs 3. Their current medications and supplements 4. The patient's functional ability including ADL's, fall risks, home safety risks and hearing or visual             impairment. 5. Diet and physical activities 6. Evidence for depression or mood disorders  The patients weight, height, BMI have been recorded in the chart and visual acuity is per eye clinic.  I have made referrals, counseling and provided education to the patient based review of the above and I have provided the pt with a written personalized care plan for preventive services.  Provider list updated- see scanned forms.  Routine anticipatory guidance given to patient.  See health maintenance. The possibility exists that previously documented standard health maintenance information may have been brought forward from a previous encounter into this note.  If needed, that same information has been updated to reflect the current situation based on today's encounter.    Flu 2018 Shingles 2008 PNA UTD Tetanus 2011 Colonoscopy not indicated.   Breast cancer screening d/w pt.  Done 2018.  DXA ordered 2019.   Advance directive d/w pt.   Granddaughter Geryl Rankins designated if patient were incapacitated.  Cognitive function addressed- see scanned forms- and if abnormal then additional documentation follows.   She is caring for her husband, d/w pt.  Support offered.  She'll update me as needed.  D/w pt about trying to get out of the house some, to get a break- she goes swimming, plays cards with friends, her son helps out some.    D/w pt about routine derm f/u.    Statin intolerant. D/w pt.   Hypothyroidism.  Complaint.  No ADE on med.  No neck mass, no dysphagia.  TSH pending.  Fatigue noted, could be due to home concerns with her husband.  See notes on labs.    Hypertension:    Using medication without problems or lightheadedness: yes Chest pain with exertion:no Edema: occ, at baseline, if upright a long time.  Short of breath:no Other issues: d/w pt about possible losartan recall, she can check with pharmacy.   PMH and SH reviewed  Meds, vitals, and allergies reviewed.   ROS: Per HPI.  Unless specifically indicated otherwise in HPI, the patient denies:  General: fever. Eyes: acute vision changes ENT: sore throat Cardiovascular: chest pain Respiratory: SOB GI: vomiting GU: dysuria Musculoskeletal: acute back pain Derm: acute rash Neuro: acute motor dysfunction Psych: worsening mood Endocrine: polydipsia Heme: bleeding Allergy: hayfever  GEN: nad, alert and oriented HEENT: mucous membranes moist NECK: supple w/o LA CV: rrr. PULM: ctab, no inc wob ABD: soft, +bs EXT: no edema SKIN: no acute rash

## 2017-06-17 MED ORDER — LEVOTHYROXINE SODIUM 75 MCG PO TABS: 75.0000 ug | ORAL_TABLET | Freq: Every day | ORAL | 3 refills | Status: DC

## 2017-06-17 MED ORDER — AMLODIPINE BESYLATE 10 MG PO TABS: 10.0000 mg | ORAL_TABLET | Freq: Every day | ORAL | 3 refills | Status: DC

## 2017-06-17 MED ORDER — LOSARTAN POTASSIUM-HCTZ 100-25 MG PO TABS: 1.0000 | ORAL_TABLET | Freq: Every day | ORAL | 3 refills | Status: DC

## 2017-06-17 MED ORDER — FLUTICASONE PROPIONATE 50 MCG/ACT NA SUSP: NASAL | 3 refills | Status: DC

## 2017-06-17 NOTE — Assessment & Plan Note (Signed)
Complaint.  No ADE on med.  No neck mass, no dysphagia.  TSH pending.  Fatigue noted, could be due to home concerns with her husband.  See notes on labs.

## 2017-06-17 NOTE — Assessment & Plan Note (Signed)
See notes on labs. d/w pt about possible losartan recall, she can check with pharmacy.

## 2017-06-17 NOTE — Assessment & Plan Note (Signed)
Flu 2018 Shingles 2008 PNA UTD Tetanus 2011 Colonoscopy not indicated.   Breast cancer screening d/w pt.  Done 2018.  DXA ordered 2019.   Advance directive d/w pt.   Granddaughter Geryl Rankins designated if patient were incapacitated.  Cognitive function addressed- see scanned forms- and if abnormal then additional documentation follows.

## 2017-07-13 ENCOUNTER — Telehealth: Payer: Self-pay | Admitting: Family Medicine

## 2017-07-13 ENCOUNTER — Inpatient Hospital Stay: Admission: RE | Admit: 2017-07-13 | Payer: Medicare Other | Source: Ambulatory Visit

## 2017-07-13 DIAGNOSIS — M8580 Other specified disorders of bone density and structure, unspecified site: Secondary | ICD-10-CM

## 2017-07-13 DIAGNOSIS — E348 Other specified endocrine disorders: Secondary | ICD-10-CM

## 2017-07-13 DIAGNOSIS — E2839 Other primary ovarian failure: Secondary | ICD-10-CM

## 2017-07-13 NOTE — Telephone Encounter (Signed)
Breast center called they could not do bone density for pt   Order needs to be changed to  estrogen deficiency   osteopenia

## 2017-07-15 NOTE — Telephone Encounter (Signed)
Order is changed.  Thanks.

## 2017-07-20 ENCOUNTER — Telehealth: Payer: Self-pay | Admitting: Family Medicine

## 2017-07-20 NOTE — Telephone Encounter (Signed)
Copied from [place] [phone]. Topic: Appointment Scheduling - Scheduling Inquiry for Clinic >> Jul 20, 2017  4:09 PM Boyd Kerbs wrote: Reason for CRM: pt. Said that the place for her bone density test said they needed info from doctor before can schedule appt. Returning a call.

## 2017-07-20 NOTE — Telephone Encounter (Signed)
Copied from [place] [phone]. Topic: Appointment Scheduling - Scheduling Inquiry for Clinic >> Jul 20, 2017  4:09 PM Boyd Kerbs wrote: Reason for CRM: pt. Said that the place for her bone density test said they needed info from doctor before can schedule appt. Returning a call.

## 2017-07-21 NOTE — Telephone Encounter (Signed)
Appointment 5/30 pt aware

## 2017-08-19 ENCOUNTER — Other Ambulatory Visit (INDEPENDENT_AMBULATORY_CARE_PROVIDER_SITE_OTHER): Payer: Medicare Other

## 2017-08-19 DIAGNOSIS — E039 Hypothyroidism, unspecified: Secondary | ICD-10-CM

## 2017-08-19 LAB — TSH: TSH: 2.08 u[IU]/mL (ref 0.35–4.50)

## 2017-08-24 ENCOUNTER — Telehealth: Payer: Self-pay | Admitting: Family Medicine

## 2017-08-24 NOTE — Telephone Encounter (Signed)
Spoke to pt

## 2017-08-24 NOTE — Telephone Encounter (Signed)
Copied from [place] [phone]. Topic: Quick Communication - See Telephone Encounter >> Aug 24, 2017  2:27 PM Bea Graff, NT wrote: CRM for notification. See Telephone encounter for: 08/24/17. Pt would like a call to go over her thyroid lab results.

## 2017-08-26 ENCOUNTER — Inpatient Hospital Stay: Admission: RE | Admit: 2017-08-26 | Payer: Medicare Other | Source: Ambulatory Visit

## 2017-10-14 ENCOUNTER — Ambulatory Visit
Admission: RE | Admit: 2017-10-14 | Discharge: 2017-10-14 | Disposition: A | Discharge status: Home / Self Care | Payer: Medicare Other | Source: Ambulatory Visit | Attending: Family Medicine | Admitting: Family Medicine

## 2017-10-14 DIAGNOSIS — E2839 Other primary ovarian failure: Secondary | ICD-10-CM

## 2017-10-14 DIAGNOSIS — Z780 Asymptomatic menopausal state: Secondary | ICD-10-CM | POA: Diagnosis not present

## 2017-10-14 DIAGNOSIS — M8580 Other specified disorders of bone density and structure, unspecified site: Secondary | ICD-10-CM

## 2017-10-14 DIAGNOSIS — M85851 Other specified disorders of bone density and structure, right thigh: Secondary | ICD-10-CM | POA: Diagnosis not present

## 2017-10-22 ENCOUNTER — Telehealth: Payer: Self-pay | Admitting: Family Medicine

## 2017-10-22 NOTE — Telephone Encounter (Signed)
Patient's granddaughter was here for a visit with Dr. Damita Dunnings and said that her grandmother did not understand the bone density results and asked that I phone her again.  I spoke with the patient who asked that I give the results to her granddaughter who will explain it to her.

## 2017-10-22 NOTE — Telephone Encounter (Signed)
Copied from [place] [phone]. Topic: Quick Communication - See Telephone Encounter >> Oct 22, 2017  1:37 PM Mylinda Latina, NT wrote: CRM for notification. See Telephone encounter for: 10/22/17. Patient granddaughter Luiz Iron called and states that the patient got results about her Bone Density . Patient is wanting a nurse to call to explain it in detail again. Please call patient   CB# [phone]

## 2017-10-25 ENCOUNTER — Ambulatory Visit (INDEPENDENT_AMBULATORY_CARE_PROVIDER_SITE_OTHER)
Admission: RE | Admit: 2017-10-25 | Discharge: 2017-10-25 | Disposition: A | Discharge status: Home / Self Care | Payer: Medicare Other | Source: Ambulatory Visit | Attending: Internal Medicine | Admitting: Internal Medicine

## 2017-10-25 ENCOUNTER — Encounter: Payer: Self-pay | Admitting: Internal Medicine

## 2017-10-25 ENCOUNTER — Ambulatory Visit: Payer: Medicare Other | Admitting: Internal Medicine

## 2017-10-25 VITALS — BP 126/80 | HR 84 | Temp 98.0°F | Wt 188.0 lb

## 2017-10-25 DIAGNOSIS — R05 Cough: Secondary | ICD-10-CM

## 2017-10-25 DIAGNOSIS — R059 Cough, unspecified: Secondary | ICD-10-CM

## 2017-10-25 DIAGNOSIS — R531 Weakness: Secondary | ICD-10-CM

## 2017-10-25 DIAGNOSIS — R911 Solitary pulmonary nodule: Secondary | ICD-10-CM | POA: Diagnosis not present

## 2017-10-25 NOTE — Progress Notes (Signed)
Subjective:    Patient ID: Lisa [name], female    DOB: Jun 29, 1932, 84 y.o.   MRN: [id number]  HPI  Pt presents to the clinic today with c/o left ear drainage and cough. She reports this started months ago. She is not sure the color of the drainage from her ear and reports it has no odor. She has not noticed any bleeding from the ear. She denies ear pain or decreased hearing. The cough is productive of yellow mucous. She denies runny nose, nasal congestion, sore throat. She denies chest pain or shortness of breath. She denies fever, chills or body aches. She has tried Robitussin, Mucine and Flonase with minimal relief. She has not had sick contacts.  Review of Systems  Past Medical History:  Diagnosis Date  . Diverticulosis   . Hemorrhoids    internal and external  . Hyperlipidemia 01/29/1991  . Hypertension 10/29/1995  . Hypothyroidism 10/29/1995  . Melanoma ([place])    h/o, local excision. no chemo or rady.   . Skin cancer (melanoma) ([place])   . Tubular adenoma of colon     Current Outpatient Medications  Medication Sig Dispense Refill  . amLODipine (NORVASC) 10 MG tablet Take 1 tablet (10 mg total) by mouth daily. 90 tablet 3  . Cholecalciferol (VITAMIN D) 2000 units tablet Take 2,000 Units by mouth 2 (two) times daily.    Marland Kitchen co-enzyme Q-10 30 MG capsule Take 90 mg by mouth daily.     Marland Kitchen docusate sodium (COLACE) 100 MG capsule Take 100 mg by mouth daily as needed for mild constipation.    . fluticasone (FLONASE) 50 MCG/ACT nasal spray USE 2 SPRAYS IN EACH NOSTRIL DAILY AS NEEDED FOR RHINITIS 48 g 3  . levothyroxine (SYNTHROID, LEVOTHROID) 75 MCG tablet Take 1 tablet (75 mcg total) by mouth daily. 90 tablet 3  . losartan-hydrochlorothiazide (HYZAAR) 100-25 MG tablet Take 1 tablet by mouth daily. 90 tablet 3  . OVER THE COUNTER MEDICATION      No current facility-administered medications for this visit.     Allergies  Allergen Reactions  . Atorvastatin     CAUSE BURN AND  TINGLING IN LEGS  . Celebrex [Celecoxib] Swelling    And rash  . Cholecalciferol Other (See Comments)    nervous, crying with high dose replacement.    . Simvastatin     Myalgias    Family History  Problem Relation Age of Onset  . Stroke Mother   . Heart disease Mother        CHF  . Stomach cancer Sister   . Cancer Brother        Metastatic cancer  . Colon cancer Neg Hx   . Breast cancer Neg Hx     Social History   Socioeconomic History  . Marital status: Married    Spouse name: Not on file  . Number of children: 2  . Years of education: Not on file  . Highest education level: Not on file  Occupational History  . 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 Financial resource strain: Not on file  . Food insecurity:    Worry: Not on file    Inability: Not on file  . Transportation needs:    Medical: Not on file    Non-medical: Not on file  Tobacco Use  . Smoking status: Former Research scientist (life sciences)  . Smokeless tobacco: Never Used  . Tobacco comment:  quit 1994  Substance and Sexual Activity  . Alcohol use: No  . Drug use: No  . Sexual activity: Never  Lifestyle  . Physical activity:    Days per week: Not on file    Minutes per session: Not on file  . Stress: Not on file  Relationships  . Social connections:    Talks on phone: Not on file    Gets together: Not on file    Attends religious service: Not on file    Active member of club or organization: Not on file    Attends meetings of clubs or organizations: Not on file    Relationship status: Not on file  . Intimate partner violence:    Fear of current or ex partner: Not on file    Emotionally abused: Not on file    Physically abused: Not on file    Forced sexual activity: Not on file  Other Topics Concern  . Not on file  Social History Narrative   Married 1957 lives with husband     Constitutional: Pt reports generalized weakness. Denies fever, malaise,  fatigue, headache or abrupt weight changes.  HEENT: Pt reports left ear drainage. Denies eye pain, eye redness, ear pain, ringing in the ears, wax buildup, runny nose, nasal congestion, bloody nose, or sore throat. Respiratory: Pt reports cough. Denies difficulty breathing, shortness of breath.   Cardiovascular: Denies chest pain, chest tightness, palpitations or swelling in the hands or feet.   No other specific complaints in a complete review of systems (except as listed in HPI above).     Objective:   Physical Exam   BP 126/80   Pulse 84   Temp 98 F (36.7 C) (Oral)   Wt 188 lb (85.3 kg)   SpO2 96%   BMI 31.28 kg/m  Wt Readings from Last 3 Encounters:  10/25/17 188 lb (85.3 kg)  06/15/17 200 lb 8 oz (90.9 kg)  06/18/16 211 lb 8 oz (95.9 kg)    General: Appears her stated age, well developed, well nourished in NAD. HEENT:Ears: Tm's gray and intact, normal light reflex;  Throat/Mouth: Teeth present, mucosa pink and moist, + PND, no exudate, lesions or ulcerations noted.  Neck:  Neck supple, trachea midline. No masses, lumps or thyromegaly present.  Cardiovascular: Normal rate and rhythm.  Pulmonary/Chest: Normal effort and positive vesicular breath sounds, diminished in the RLL. No respiratory distress. No wheezes, rales or ronchi noted.   BMET    Component Value Date/Time   NA 142 06/15/2017 1002   K 3.8 06/15/2017 1002   CL 104 06/15/2017 1002   CO2 30 06/15/2017 1002   GLUCOSE 87 06/15/2017 1002   BUN 22 06/15/2017 1002   CREATININE 1.51 (H) 06/15/2017 1002   CALCIUM 10.3 06/15/2017 1002   GFRNONAA 33 (L) 11/17/2012 2248   GFRAA 38 (L) 11/17/2012 2248    Lipid Panel     Component Value Date/Time   CHOL 234 (H) 06/15/2017 1002   TRIG 143.0 06/15/2017 1002   HDL 47.10 06/15/2017 1002   CHOLHDL 5 06/15/2017 1002   VLDL 28.6 06/15/2017 1002   LDLCALC 158 (H) 06/15/2017 1002    CBC    Component Value Date/Time   WBC 4.1 03/19/2015 1229   RBC 4.38  03/19/2015 1229   HGB 12.7 03/19/2015 1229   HCT 38.2 03/19/2015 1229   PLT 192.0 03/19/2015 1229   MCV 87.2 03/19/2015 1229   MCH 29.8 11/17/2012 2248   MCHC 33.2 03/19/2015 1229  RDW 14.5 03/19/2015 1229   LYMPHSABS 1.6 11/17/2012 2248   MONOABS 0.7 11/17/2012 2248   EOSABS 0.1 11/17/2012 2248   BASOSABS 0.0 11/17/2012 2248    Hgb A1C No results found for: HGBA1C        Assessment & Plan:   Cough, Generalized Weakness:  Will check chest xray for further evaluation CBC and CMET today Continue Robitussin for now  Will follow up after labs and xray Webb Silversmith, NP

## 2017-10-26 ENCOUNTER — Telehealth: Payer: Self-pay

## 2017-10-26 ENCOUNTER — Encounter (HOSPITAL_COMMUNITY): Payer: Self-pay

## 2017-10-26 ENCOUNTER — Telehealth: Payer: Self-pay | Admitting: *Deleted

## 2017-10-26 ENCOUNTER — Other Ambulatory Visit: Payer: Self-pay | Admitting: Internal Medicine

## 2017-10-26 ENCOUNTER — Ambulatory Visit (HOSPITAL_COMMUNITY)
Admission: RE | Admit: 2017-10-26 | Discharge: 2017-10-26 | Disposition: A | Discharge status: Home / Self Care | Payer: Medicare Other | Source: Ambulatory Visit | Attending: Internal Medicine | Admitting: Internal Medicine

## 2017-10-26 DIAGNOSIS — R918 Other nonspecific abnormal finding of lung field: Secondary | ICD-10-CM | POA: Diagnosis not present

## 2017-10-26 DIAGNOSIS — I712 Thoracic aortic aneurysm, without rupture, unspecified: Secondary | ICD-10-CM

## 2017-10-26 DIAGNOSIS — I700 Atherosclerosis of aorta: Secondary | ICD-10-CM | POA: Diagnosis not present

## 2017-10-26 DIAGNOSIS — J189 Pneumonia, unspecified organism: Secondary | ICD-10-CM | POA: Insufficient documentation

## 2017-10-26 DIAGNOSIS — R911 Solitary pulmonary nodule: Secondary | ICD-10-CM | POA: Diagnosis present

## 2017-10-26 LAB — COMPREHENSIVE METABOLIC PANEL
ALT: 42 U/L — ABNORMAL HIGH (ref 0–35)
AST: 45 U/L — ABNORMAL HIGH (ref 0–37)
Albumin: 3.7 g/dL (ref 3.5–5.2)
Alkaline Phosphatase: 162 U/L — ABNORMAL HIGH (ref 39–117)
BUN: 27 mg/dL — ABNORMAL HIGH (ref 6–23)
CO2: 29 mEq/L (ref 19–32)
Calcium: 10.6 mg/dL — ABNORMAL HIGH (ref 8.4–10.5)
Chloride: 99 mEq/L (ref 96–112)
Creatinine, Ser: 1.67 mg/dL — ABNORMAL HIGH (ref 0.40–1.20)
GFR: 31.12 mL/min — ABNORMAL LOW (ref >60.00)
Glucose, Bld: 98 mg/dL (ref 70–99)
Potassium: 4.2 mEq/L (ref 3.5–5.1)
Sodium: 137 mEq/L (ref 135–145)
Total Bilirubin: 0.5 mg/dL (ref 0.2–1.2)
Total Protein: 7.8 g/dL (ref 6.0–8.3)

## 2017-10-26 LAB — CBC
HCT: 34.2 % — ABNORMAL LOW (ref 36.0–46.0)
Hemoglobin: 10.9 g/dL — ABNORMAL LOW (ref 12.0–15.0)
MCHC: 31.9 g/dL (ref 30.0–36.0)
MCV: 83.6 fl (ref 78.0–100.0)
Platelets: 401 10*3/uL — ABNORMAL HIGH (ref 150.0–400.0)
RBC: 4.1 Mil/uL (ref 3.87–5.11)
RDW: 14.9 % (ref 11.5–15.5)
WBC: 11.1 10*3/uL — ABNORMAL HIGH (ref 4.0–10.5)

## 2017-10-26 NOTE — Progress Notes (Signed)
a 

## 2017-10-26 NOTE — Telephone Encounter (Signed)
Order was changed to a CT Chest without contrast per Radiology at [facility]. Same Authorization # for the without contrast study.

## 2017-10-26 NOTE — Telephone Encounter (Signed)
Opal Sidles with [facility] radiology called report on CXR. Report is in epic and I am taking copy of report to Avie Echevaria NP now.

## 2017-10-26 NOTE — Telephone Encounter (Signed)
Copied from [place] [phone]. Topic: General - Other >> Oct 26, 2017 10:47 AM Judyann Munson wrote: Reason for CRM: Tammy from [facility] is calling to speak with Webb Silversmith in regards to the patient needing a new order with CT scan with out Contrast. Please advise

## 2017-10-26 NOTE — Telephone Encounter (Signed)
Pt notified of this result by provider

## 2017-10-26 NOTE — Telephone Encounter (Signed)
There is an order for CT scan with contrast in pts chart.Please advise.

## 2017-10-26 NOTE — Telephone Encounter (Signed)
Patients family is well known to [facility] and asked for recs regarding CXR/CT results.  She asked that I let Dr. Servando Snare know to see if he can review recent CT done an give any advice.  Dr. Servando Snare made aware.

## 2017-10-26 NOTE — Patient Instructions (Signed)

## 2017-10-26 NOTE — Telephone Encounter (Signed)
Rosaria Ferries, can you check on this

## 2017-10-26 NOTE — Addendum Note (Signed)
Addended by: Jearld Fenton on: 10/26/2017 12:48 PM   Modules accepted: Orders

## 2017-10-27 ENCOUNTER — Institutional Professional Consult (permissible substitution): Payer: Medicare Other | Admitting: Cardiothoracic Surgery

## 2017-10-27 ENCOUNTER — Other Ambulatory Visit: Payer: Self-pay | Admitting: *Deleted

## 2017-10-27 ENCOUNTER — Encounter: Payer: Self-pay | Admitting: Cardiothoracic Surgery

## 2017-10-27 ENCOUNTER — Other Ambulatory Visit: Payer: Self-pay

## 2017-10-27 ENCOUNTER — Telehealth: Payer: Self-pay | Admitting: Cardiovascular Disease

## 2017-10-27 VITALS — BP 120/74 | HR 95 | Resp 16 | Ht 65.0 in | Wt 188.0 lb

## 2017-10-27 DIAGNOSIS — R918 Other nonspecific abnormal finding of lung field: Secondary | ICD-10-CM

## 2017-10-27 DIAGNOSIS — I712 Thoracic aortic aneurysm, without rupture, unspecified: Secondary | ICD-10-CM

## 2017-10-27 DIAGNOSIS — R911 Solitary pulmonary nodule: Secondary | ICD-10-CM

## 2017-10-27 DIAGNOSIS — Z01818 Encounter for other preprocedural examination: Secondary | ICD-10-CM

## 2017-10-27 DIAGNOSIS — I7121 Aneurysm of the ascending aorta, without rupture: Secondary | ICD-10-CM

## 2017-10-27 NOTE — Telephone Encounter (Signed)
New Message:       Jana Half from Dr. Leanne Chang office is calling to see if the pt can get a sooner appt that 11/30/17 with a PA or cooper to get surgical clearance for a right lung mass that needs to be removed asap. They ask that well call the pt with the appt information.

## 2017-10-27 NOTE — Progress Notes (Signed)
[address]       [place] [address]             [phone]                    Lisa [name] [place] Medical Record #[id number] Date of Birth: [birth year]  Referring: Jearld Fenton, NP Primary Care: [name], MD Primary Cardiologist: No primary care provider on file.  Chief Complaint:    Chief Complaint  Patient presents with  . Lung Mass    .Marland KitchenRMLobe...CT CHEST  10/26/17    History of Present Illness:    Lisa [name] 84 y.o. female is seen in the office  today for evaluation of a new right lung lesion and dilated ascending aorta.  The patient is well-known to me from long-term care of her husband.  She had noticed slow deterioration in her overall health recently.  She has lost appetite had some weight loss perhaps 10 pounds but she is not sure.  Recently she had increasing nonproductive cough and right chest pain she denies any fever or chills.  She has had no hemoptysis.  Because of a cough a recent chest x-ray was done demonstrating a right middle lobe lung lesion cavitary in nature.  CT scan of the chest was done yesterday and the patient referred today to surgical clinic.   Patient has a previous history approximately 30-pack-year smoking from 19 60-19 90, none since.  She has a previous history of melanoma resection from the right shoulder, wide resection was done without evidence of residual melanoma, exact stage is unknown.  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   Family history is significant for her brother died at age 67 of lung cancer and a sister died of stomach cancer.  There is no family history of aortic aneurysms or aortic dissections.   Current Activity/ Functional Status:  Patient is independent with mobility/ambulation, transfers, ADL's, IADL's.   Zubrod Score: At the time of surgery this patient's most appropriate activity  status/level should be described as: []     0    Normal activity, no symptoms []     1    Restricted in physical strenuous activity but ambulatory, able to do out light work []     2    Ambulatory and capable of self care, unable to do work activities, up and about               >50 % of waking hours                              []     3    Only limited self care, in bed greater than 50% of waking hours []     4    Completely disabled, no self care, confined to bed or chair []     5    Moribund   Past Medical History:  Diagnosis Date  . Diverticulosis   . Hemorrhoids    internal and external  . Hyperlipidemia 01/29/1991  . Hypertension 10/29/1995  . Hypothyroidism 10/29/1995  . Melanoma ([facility])    h/o, local excision. no chemo or rady.   . Skin cancer (melanoma) ([facility])   . Tubular adenoma of colon     Past Surgical History:  Procedure Laterality Date  . cataract surgery  2007, 2008   repair bilat  . DG BARIUM ENEMA ([facility] HX) N/A 2016   [name]  . HERNIA REPAIR  04/25/2001  . KNEE SURGERY     meniscus tear per Dr. Ronnie Derby, R knee  . SEPTOPLASTY  2006   Dr. Ernesto Rutherford  . SKIN CANCER EXCISION    . TONSILLECTOMY  1954  . VAGINAL HYSTERECTOMY      Family History  Problem Relation Age of Onset  . Stroke Mother   . Heart disease Mother        CHF  . Stomach cancer Sister   . Cancer Brother        Metastatic cancer  . Colon cancer Neg Hx   . Breast cancer Neg Hx      Social History   Tobacco Use  Smoking Status Former Smoker  . Packs/day: 1.00  . Years: 30.00  . Pack years: 30.00  . Types: Cigarettes  . Start date: 10/28/1958  . Last attempt to quit: 10/27/1988  . Years since quitting: 29.0  Smokeless Tobacco Never Used  Tobacco Comment   quit 1994    Social History   Substance and Sexual Activity  Alcohol Use No     Allergies  Allergen Reactions  . Atorvastatin     CAUSE BURN AND TINGLING IN LEGS  . Celebrex [Celecoxib] Swelling    And rash  .  Cholecalciferol Other (See Comments)    nervous, crying with high dose replacement.    . Simvastatin     Myalgias    Current Outpatient Medications  Medication Sig Dispense Refill  . amLODipine (NORVASC) 10 MG tablet Take 1 tablet (10 mg total) by mouth daily. 90 tablet 3  . Cholecalciferol (VITAMIN D) 2000 units tablet Take 2,000 Units by mouth 2 (two) times daily.    Marland Kitchen co-enzyme Q-10 30 MG capsule Take 90 mg by mouth daily.     Marland Kitchen docusate sodium (COLACE) 100 MG capsule Take 100 mg by mouth daily as needed for mild constipation.    . fluticasone (FLONASE) 50 MCG/ACT nasal spray USE 2 SPRAYS IN EACH NOSTRIL DAILY AS NEEDED FOR RHINITIS 48 g 3  . levothyroxine (SYNTHROID, LEVOTHROID) 75 MCG tablet Take 1 tablet (75 mcg total) by mouth daily. 90 tablet 3  . losartan-hydrochlorothiazide (HYZAAR) 100-25 MG tablet Take 1 tablet by mouth daily. 90 tablet 3  . OVER THE COUNTER MEDICATION      No current facility-administered medications for this visit.     Pertinent items are noted in HPI.   Review of Systems:     Cardiac Review of Systems: [Y] = yes  or   [ N ] = no   Chest Pain [ n   ]  Resting SOB [ y  ] Exertional SOB  [name]  ]  Vertell Limber Florencio.Farrier  ]   Pedal Edema [name]   ]    Palpitations [ n ] Syncope  Florencio.Farrier  ]   Presyncope [ n  ]   General Review of Systems: [Y] = yes [  ]=no Constitional: recent weight change [  ];  Wt loss over the last 3 months [ y  ] anorexia [ y ]; fatigue [  ]; nausea [  ]; night sweats [  ]; fever [  ]; or chills [  ];           Eye : blurred vision [  ]; diplopia [   ];  vision changes [  ];  Amaurosis fugax[  ]; Resp: cough [name]  ];  wheezing[  ];  hemoptysis[  ]; shortness of breath[y  ]; paroxysmal nocturnal dyspnea[  ]; dyspnea on exertion[  ]; or orthopnea[  ];  GI:  gallstones[  ], vomiting[  ];  dysphagia[  ]; melena[  ];  hematochezia [  ]; heartburn[  ];   Hx of  Colonoscopy[  ]; GU: kidney stones [  ]; hematuria[  ];   dysuria [  ];  nocturia[  ];  history of      obstruction [  ]; urinary frequency [  ]             Skin: rash, swelling[  ];, hair loss[  ];  peripheral edema[  ];  or itching[  ]; Musculosketetal: myalgias[  ];  joint swelling[  ];  joint erythema[  ];  joint pain[  ];  back pain[  ];  Heme/Lymph: bruising[  ];  bleeding[  ];  anemia[  ];  Neuro: TIA[  ];  headaches[  ];  stroke[  ];  vertigo[  ];  seizures[  ];   paresthesias[  ];  difficulty walking[  ];  Psych:depression[  ]; anxiety[  ];  Endocrine: diabetes[  ];  thyroid dysfunction[  ];  Immunizations: Flu up to date [ y ]; Pneumococcal up to date [ y ];  Other:    PHYSICAL EXAMINATION: BP 120/74 (BP Location: Left Arm, Patient Position: Sitting, Cuff Size: Large)   Pulse 95   Resp 16   Ht 5\' 5"  (1.651 m)   Wt 188 lb (85.3 kg)   SpO2 94% Comment: ON RA  BMI 31.28 kg/m  General appearance: alert, cooperative, appears stated age, no distress and pale Head: Normocephalic, without obvious abnormality, atraumatic Neck: no adenopathy, no carotid bruit, no JVD, supple, symmetrical, trachea midline and thyroid not enlarged, symmetric, no tenderness/mass/nodules Lymph nodes: Cervical, supraclavicular, and axillary nodes normal. Resp: diminished breath sounds RML Back: symmetric, no curvature. ROM normal. No CVA tenderness. Cardio: regular rate and rhythm, S1, S2 normal, no murmur, click, rub or gallop GI: soft, non-tender; bowel sounds normal; no masses,  no organomegaly Extremities: extremities normal, atraumatic, no cyanosis or edema and Homans sign is negative, no sign of DVT Neurologic: Grossly normal Mild pectus deformity  Diagnostic Studies & Laboratory data:     Recent Radiology Findings:   Dg Chest 2 View  Result Date: 10/26/2017 CLINICAL DATA:  Cough for 2-3 months. Diminished right lower lobe breath sounds. EXAM: CHEST - 2 VIEW COMPARISON:  07/23/2005 FINDINGS: The cardiac silhouette is normal in size. Thoracic aortic tortuosity and atherosclerotic calcification  are noted. There is masslike opacity in the right middle lobe with focal convex margin along the minor fissure and with associated right middle lobe volume loss. Mild scarring or atelectasis is noted in the left lung base. There is a background of chronic mild lung hyperinflation. There is no pleural effusion or pneumothorax. An old posterior right fourth rib fracture is again noted. IMPRESSION: Right middle lobe volume loss with suspected underlying mass highly concerning for neoplasm. Further evaluation with chest CT is recommended. These results will be called to the ordering clinician or representative by the Radiologist Assistant, and communication documented in the PACS or zVision Dashboard. Electronically Signed   By: Logan Bores M.D.   On: 10/26/2017 09:10   Ct Chest Wo Contrast  Result Date: 10/26/2017 CLINICAL DATA:  Evaluate right lung mass  identified on chest radiograph. EXAM: CT CHEST WITHOUT CONTRAST TECHNIQUE: Multidetector CT imaging of the chest was performed following the standard protocol without IV contrast. COMPARISON:  None FINDINGS: Cardiovascular: The heart size appears normal. No pericardial effusion. Aortic atherosclerosis. Ascending aortic aneurysm measures 5.1 cm, image 74/2. Mediastinum/Nodes: Normal appearance of the thyroid gland. Normal appearance of the esophagus. The trachea appears patent and is midline. No enlarged mediastinal lymph nodes identified. Lungs/Pleura: Small right pleural effusion identified. There is a large central thick walled cavitary mass within the right middle lobe which measures 5.3 cm in maximum dimension. The mass obstructs the right middle lobe bronchus resulting in postobstructive atelectasis. Evaluation of the right hilar region is diminished due to lack of IV contrast material. Upper Abdomen: No acute abnormality. There are multiple cysts identified within the kidneys which are incompletely characterized without IV contrast material. Musculoskeletal:  No aggressive lytic or sclerotic bone lesions. Multi level thoracic spondylosis identified. An inferior endplate Schmorl's node deformity is identified within the lower thoracic spine. IMPRESSION: 1. Right middle lobe central thick walled cavitary lung mass is identified. In the absence of signs or symptoms of pneumonia findings are worrisome for a necrotic tumor. Associated postobstructive pneumonitis of the right middle lobe is noted. Recommend further evaluation with PET-CT and tissue sampling. 2. Ascending thoracic aortic aneurysm. Ascending thoracic aortic aneurysm. Recommend semi-annual imaging followup by CTA or MRA and referral to cardiothoracic surgery if not already obtained. This recommendation follows 2010 ACCF/AHA/AATS/ACR/ASA/SCA/SCAI/SIR/STS/SVM Guidelines for the Diagnosis and Management of Patients With Thoracic Aortic Disease. Circulation. 2010; 121: U235-T614. Aortic aneurysm NOS (ICD10-I71.9). Aortic Atherosclerosis (ICD10-I70.0). Electronically Signed   By: Kerby Moors M.D.   On: 10/26/2017 14:29     I have independently reviewed the above radiology studies  and reviewed the findings with the patient.   Recent Lab Findings: Lab Results  Component Value Date   WBC 11.1 (H) 10/25/2017   HGB 10.9 (L) 10/25/2017   HCT 34.2 (L) 10/25/2017   PLT 401.0 (H) 10/25/2017   GLUCOSE 98 10/25/2017   CHOL 234 (H) 06/15/2017   TRIG 143.0 06/15/2017   HDL 47.10 06/15/2017   LDLDIRECT 156.0 06/05/2015   LDLCALC 158 (H) 06/15/2017   ALT 42 (H) 10/25/2017   AST 45 (H) 10/25/2017   NA 137 10/25/2017   K 4.2 10/25/2017   CL 99 10/25/2017   CREATININE 1.67 (H) 10/25/2017   BUN 27 (H) 10/25/2017   CO2 29 10/25/2017   TSH 2.08 08/19/2017    Chronic Kidney Disease   Stage I     GFR >90  Stage II    GFR 60-89  Stage IIIA GFR 45-59  Stage IIIB GFR 30-44  Stage IV   GFR 15-29  Stage V    GFR  <15  Lab Results  Component Value Date   CREATININE 1.67 (H) 10/25/2017   Estimated  Creatinine Clearance: 27.5 mL/min (A) (by C-G formula based on SCr of 1.67 mg/dL (H)).   Assessment / Plan:   Right middle lobe lung mass with central cavitary lesion -suggestive of squamous cell carcinoma of the lung Ascending aortic aneurysm approximately 5 cm in size not fully evaluated with the noncontrasted CT scan stage IV chronic kidney disease, creatinine 1.67 History of squamous cell carcinoma in situ of the skin History of melanoma resection-unknown stage  I seen and examined the patient, with her granddaughter present who helped with the symptoms she had been noting.  The lesion of the right middle lobe with central  cavitation in question of hilar adenopathy is suggestive of squamous cell carcinoma of the lung-I discussed this with the patient and recommended that her next step is to proceed with a PET scan, depending on the findings develop a strategy for biopsy.  Likely bronchoscopy with navigation and possible ebus to obtain a tissue diagnosis.   I discussed with the patient the dilated ascending aorta-we will obtain a echocardiogram to evaluate for possible bicuspid valve.  At this point her dilated aorta can be followed.  She did note that she was not interested in having major surgery including replacement of her ascending aorta.  Following the PET scan we will make a final recommendation as far as biopsy strategy.  The patient and her granddaughter had their questions answered We will ask her to see Dr. Burt Knack concerning preop cardiac evaluation prior to bronchoscopy with ebus and biopsy.  I  spent 60 minutes with  the patient face to face and greater then 50% of the time was spent in counseling and coordination of care.    Grace Isaac MD      [address] [place] [address] Office [phone]   Beeper [phone]  10/27/2017 1:20 PM

## 2017-10-27 NOTE — Telephone Encounter (Signed)
Called to offer patient appointment with Dr. Burt Knack 8/12.  She has an already scheduled appointment with Tommas Olp on 8/5 for surgical clearance and wishes to keep the sooner appointment at this time. She was grateful for call and agrees with treatment plan.

## 2017-10-28 NOTE — Progress Notes (Signed)
Lisa [name] - please see note from Dr Servando Snare. Can you get her in for a preop visit with me or an APP? Thanks

## 2017-10-29 ENCOUNTER — Ambulatory Visit (HOSPITAL_COMMUNITY): Payer: Medicare Other | Attending: Cardiothoracic Surgery

## 2017-10-29 ENCOUNTER — Other Ambulatory Visit: Payer: Self-pay

## 2017-10-29 DIAGNOSIS — I712 Thoracic aortic aneurysm, without rupture: Secondary | ICD-10-CM | POA: Diagnosis not present

## 2017-10-29 DIAGNOSIS — I083 Combined rheumatic disorders of mitral, aortic and tricuspid valves: Secondary | ICD-10-CM | POA: Insufficient documentation

## 2017-10-29 DIAGNOSIS — Z01818 Encounter for other preprocedural examination: Secondary | ICD-10-CM | POA: Diagnosis not present

## 2017-10-29 DIAGNOSIS — E785 Hyperlipidemia, unspecified: Secondary | ICD-10-CM | POA: Diagnosis not present

## 2017-10-29 DIAGNOSIS — Z87891 Personal history of nicotine dependence: Secondary | ICD-10-CM | POA: Insufficient documentation

## 2017-10-29 DIAGNOSIS — I7121 Aneurysm of the ascending aorta, without rupture: Secondary | ICD-10-CM

## 2017-10-29 DIAGNOSIS — I10 Essential (primary) hypertension: Secondary | ICD-10-CM | POA: Insufficient documentation

## 2017-11-01 ENCOUNTER — Encounter: Payer: Self-pay | Admitting: Nurse Practitioner

## 2017-11-01 ENCOUNTER — Telehealth (HOSPITAL_COMMUNITY): Payer: Self-pay | Admitting: *Deleted

## 2017-11-01 ENCOUNTER — Ambulatory Visit (INDEPENDENT_AMBULATORY_CARE_PROVIDER_SITE_OTHER): Payer: Medicare Other | Admitting: Nurse Practitioner

## 2017-11-01 VITALS — BP 140/70 | HR 91 | Ht 66.0 in | Wt 188.0 lb

## 2017-11-01 DIAGNOSIS — Z01810 Encounter for preprocedural cardiovascular examination: Secondary | ICD-10-CM | POA: Diagnosis not present

## 2017-11-01 NOTE — Telephone Encounter (Signed)
Patient given detailed instructions per Myocardial Perfusion Study Information Sheet for the test on 11/03/17 at 0745. Patient notified to arrive 15 minutes early and that it is imperative to arrive on time for appointment to keep from having the test rescheduled.  If you need to cancel or reschedule your appointment, please call the office within 24 hours of your appointment. . Patient verbalized understanding.Achaia Garlock, Ranae Palms

## 2017-11-01 NOTE — Progress Notes (Signed)
CARDIOLOGY OFFICE NOTE  Date:  11/01/2017    Lisa [name] Date of Birth: 1932/07/29 Medical Record #[id number]  PCP:  Lisa Ghent, MD  Cardiologist:  Lisa [name]  Chief Complaint  Patient presents with  . Pre-op Exam    Work in visit for surgical clearance - seen for Lisa [name]    History of Present Illness: Lisa [name] is a 84 y.o. female who presents today for a pre op clearance visit at the request of Lisa [name].   She has a history of HTN, HLD with statin intolerance and remote chest pain. She had a negative Myoview in 2014 with normal LV function and no evidence of ischemia.   She was last seen by Dr. Burt [name] in December of 2016 and was doing well with no cardiac related complaints.   Seen last week by Lisa [name] for new right lung lesion and a dilated ascending aorta. She has had slow decline in her overall health with weight loss, cough, and right sided chest pain. CXR showed right middle lobe lung lesion - cavitary in nature. She has had prior smoking history. She has had prior melanoma resection from the right shoulder and a squamous cell carcinoma removed from the right chest and lower calf in 2012.   She is to have PET scan and contrast CT.   Comes in today. Here alone. Seems a little overwhelmed with upcoming tests. She does not have chest pain. She does have some shortness of breath - worse with activity. Still with congested cough. Not dizzy or lightheaded. She notes progressive weakness - that is probably her biggest complaint. She is not able to walk 1 block. 1 flight of stairs would be "difficult". She has not had her medicines today.   Past Medical History:  Diagnosis Date  . Diverticulosis   . Hemorrhoids    internal and external  . Hyperlipidemia 01/29/1991  . Hypertension 10/29/1995  . Hypothyroidism 10/29/1995  . Melanoma ([facility])    h/o, local excision. no chemo or rady.   . Skin cancer (melanoma) ([facility])   . Tubular adenoma of colon     Past  Surgical History:  Procedure Laterality Date  . cataract surgery  2007, 2008   repair bilat  . DG BARIUM ENEMA ([facility] HX) N/A 2016   [name]  . HERNIA REPAIR  04/25/2001  . KNEE SURGERY     meniscus tear per Dr. Ronnie [name], R knee  . SEPTOPLASTY  2006   Dr. Ernesto [name]  . SKIN CANCER EXCISION    . TONSILLECTOMY  1954  . VAGINAL HYSTERECTOMY       Medications: Current Meds  Medication Sig  . amLODipine (NORVASC) 10 MG tablet Take 1 tablet (10 mg total) by mouth daily.  . Cholecalciferol (VITAMIN D) 2000 units tablet Take 2,000 Units by mouth 2 (two) times daily.  Marland Kitchen co-enzyme Q-10 30 MG capsule Take 90 mg by mouth daily.   Marland Kitchen docusate sodium (COLACE) 100 MG capsule Take 100 mg by mouth daily as needed for mild constipation.  . fluticasone (FLONASE) 50 MCG/ACT nasal spray USE 2 SPRAYS IN EACH NOSTRIL DAILY AS NEEDED FOR RHINITIS  . levothyroxine (SYNTHROID, LEVOTHROID) 75 MCG tablet Take 1 tablet (75 mcg total) by mouth daily.  Marland Kitchen losartan-hydrochlorothiazide (HYZAAR) 100-25 MG tablet Take 1 tablet by mouth daily.  Marland Kitchen OVER THE COUNTER MEDICATION      Allergies: Allergies  Allergen Reactions  . Atorvastatin     CAUSE BURN AND  TINGLING IN LEGS  . Celebrex [Celecoxib] Swelling    And rash  . Cholecalciferol Other (See Comments)    nervous, crying with high dose replacement.    . Simvastatin     Myalgias    Social History: The patient  reports that she quit smoking about 29 years ago. Her smoking use included cigarettes. She started smoking about 59 years ago. She has a 30.00 pack-year smoking history. She has never used smokeless tobacco. She reports that she does not drink alcohol or use drugs.   Family History: The patient's family history includes Cancer in her brother; Heart disease in her mother; Stomach cancer in her sister; Stroke in her mother. Brother died at age 66 of lung cancer and a sister died of stomach cancer.   Review of Systems: Please see the history of present  illness.   Otherwise, the review of systems is positive for none.   All other systems are reviewed and negative.   Physical Exam: VS:  BP 140/70 (BP Location: Left Arm, Patient Position: Sitting, Cuff Size: Large)   Pulse 91   Ht 5\' 6"  (1.676 m)   Wt 188 lb (85.3 kg)   BMI 30.34 kg/m  .  BMI Body mass index is 30.34 kg/m.  Wt Readings from Last 3 Encounters:  11/01/17 188 lb (85.3 kg)  10/27/17 188 lb (85.3 kg)  10/25/17 188 lb (85.3 kg)    General: Pleasant. Alert and in no acute distress.   HEENT: Normal.  Neck: Supple, no JVD, carotid bruits, or masses noted.  Cardiac: Regular rate and rhythm. No murmurs, rubs, or gallops. Legs are full but no significant edema.  Respiratory:  Lungs are fairly clear to auscultation but somewhat decreased on the right.  GI: Soft and nontender.  MS: No deformity or atrophy. Gait and ROM intact.  Skin: Warm and dry. Color is normal.  Neuro:  Strength and sensation are intact and no gross focal deficits noted.  Psych: Alert, appropriate and with normal affect.   LABORATORY DATA:  EKG:  EKG is ordered today. This demonstrates NSR - septal Q's - unchanged.  Lab Results  Component Value Date   WBC 11.1 (H) 10/25/2017   HGB 10.9 (L) 10/25/2017   HCT 34.2 (L) 10/25/2017   PLT 401.0 (H) 10/25/2017   GLUCOSE 98 10/25/2017   CHOL 234 (H) 06/15/2017   TRIG 143.0 06/15/2017   HDL 47.10 06/15/2017   LDLDIRECT 156.0 06/05/2015   LDLCALC 158 (H) 06/15/2017   ALT 42 (H) 10/25/2017   AST 45 (H) 10/25/2017   NA 137 10/25/2017   K 4.2 10/25/2017   CL 99 10/25/2017   CREATININE 1.67 (H) 10/25/2017   BUN 27 (H) 10/25/2017   CO2 29 10/25/2017   TSH 2.08 08/19/2017     BNP (last 3 results) No results for input(s): BNP in the last 8760 hours.  ProBNP (last 3 results) No results for input(s): PROBNP in the last 8760 hours.   Other Studies Reviewed Today:   Echo Study Conclusions 10/2017  - Left ventricle: Global longitudinal strain is  -20.4% Mild inferor   and inferoseptal hypokinesis. The cavity size was normal. Wall   thickness was increased in a pattern of mild LVH. Systolic   function was normal. The estimated ejection fraction was in the   range of 50% to 55%. - Aortic valve: There was mild regurgitation. - Aorta: Aortc root 35 mm Sinotubular junction is 31 mm. Ascending   aorta is dilated at  47 mm. - Mitral valve: There was mild regurgitation. - Pulmonary arteries: PA peak pressure: 33 mm Hg (S).   CT CHEST IMPRESSION 09/2017: 1. Right middle lobe central thick walled cavitary lung mass is identified. In the absence of signs or symptoms of pneumonia findings are worrisome for a necrotic tumor. Associated postobstructive pneumonitis of the right middle lobe is noted. Recommend further evaluation with PET-CT and tissue sampling. 2. Ascending thoracic aortic aneurysm. Ascending thoracic aortic aneurysm. Recommend semi-annual imaging followup by CTA or MRA and referral to cardiothoracic surgery if not already obtained. This recommendation follows 2010 ACCF/AHA/AATS/ACR/ASA/SCA/SCAI/SIR/STS/SVM Guidelines for the Diagnosis and Management of Patients With Thoracic Aortic Disease. Circulation. 2010; 121: P546-F681. Aortic aneurysm NOS (ICD10-I71.9). Aortic Atherosclerosis (ICD10-I70.0).   Electronically Signed   By: Kerby Moors M.D.   On: 10/26/2017 14:29   Myoview Scan 06/07/2012:  Impression  Exercise Capacity: Lexiscan with low level exercise.  BP Response: Normal blood pressure response.  Clinical Symptoms: shortness of breath  ECG Impression: No significant ST segment change suggestive of ischemia.  Comparison with Prior Nuclear Study: No previous nuclear study performed  Overall Impression: Normal stress nuclear scan. There is mild decreased activity at the apex most consistent with apical thinning. There is interference from nuclear activity from structures below the diaphragm. This does not  affect the ability to read the study.  LV Ejection Fraction: 55%. LV Wall Motion: NL LV Function; NL Wall Motion     Assessment/Plan:  1. Pre op clearance - has right middle lobe lung mass with central cavitary lesion - for PET scan next week. She is not able to do 4 mets of activity at this time - given her age, risk factors will arrange for Aspers.   2. Dilated ascending aorta at 5 cm - not fully evaluated with the non contrast CT - looks like she will be having CT with contrast to further define. Noted echo measurements.   3. Stage IV CKD  4. HTN - BP is fair - no medicines today.   5. Remote chest pain with negative Myoview - her chest pain has not recurred.   6. HLD - statin intolerant  7. Mild anemia - ?resultant from malignant process  8. Mildly elevated LFTs - worrisome as well.   9. Remote smoker.    Current medicines are reviewed with the patient today.  The patient does not have concerns regarding medicines other than what has been noted above.  The following changes have been made:  See above.  Labs/ tests ordered today include:    Orders Placed This Encounter  Procedures  . EKG 12-Lead     Disposition:   Further disposition pending. Lexiscan is arranged for Wednesday.   Patient is agreeable to this plan and will call if any problems develop in the interim.   SignedTruitt Merle, NP  11/01/2017 8:58 AM  [facility] [address] [place], [place]  [address] Phone: [phone] Fax: [phone]

## 2017-11-01 NOTE — Patient Instructions (Addendum)
We will be checking the following labs today - NONE   Medication Instructions:    Continue with your current medicines.     Testing/Procedures To Be Arranged:  Lexiscan Myoview - this week - not Thursday  Follow-Up:   We will see how your stress test turns out and then decide about follow up.     Other Special Instructions:  You are scheduled for a Myocardial Perfusion Imaging Study on __________________________________ at ___________________________________________.   Please arrive 15 minutes prior to your appointment time for registration and insurance purposes.   The test will take approximately 3 to 4 hours to complete; you may bring reading material. If someone comes with you to your appointment, they will need to remain in the main lobby due to limited space in the testing area.   If you are pregnant or breastfeeding, please notify the nuclear lab prior to your appointment.   How to prepare for your Myocardial Perfusion test:   Do not eat or drink 3 hours prior to your test, except you may have water.    Do not consume products containing caffeine (regular or decaffeinated) 12 hours prior to your test (ex: coffee, chocolate, soda, tea)   Do bring a list of your current medications with you. If not listed below, you may take your medications as normal.      Bring any held medication to your appointment, as you may be required to take it once the test is complete.   Do wear comfortable clothes (no dresses or overalls) and walking shoes. Tennis shoes are preferred. No heels or open toed shoes.  Do not wear cologne, perfume, aftershave or lotions (deodorant is allowed).   If these instructions are not followed, you test will have to be rescheduled.   Please report to [address] for your test. If you have questions or concerns about your appointment, please call the Nuclear Lab at [phone].  If you cannot keep your appointment, please  provide 24 hour notification to the Nuclear lab to avoid a possible $50 charge to your account.           If you need a refill on your cardiac medications before your next appointment, please call your pharmacy.   Call the [place] office at [phone] if you have any questions, problems or concerns.

## 2017-11-03 ENCOUNTER — Ambulatory Visit (HOSPITAL_COMMUNITY): Payer: Medicare Other | Attending: Cardiology

## 2017-11-03 DIAGNOSIS — I129 Hypertensive chronic kidney disease with stage 1 through stage 4 chronic kidney disease, or unspecified chronic kidney disease: Secondary | ICD-10-CM | POA: Diagnosis not present

## 2017-11-03 DIAGNOSIS — Z87891 Personal history of nicotine dependence: Secondary | ICD-10-CM | POA: Insufficient documentation

## 2017-11-03 DIAGNOSIS — N184 Chronic kidney disease, stage 4 (severe): Secondary | ICD-10-CM | POA: Insufficient documentation

## 2017-11-03 DIAGNOSIS — D649 Anemia, unspecified: Secondary | ICD-10-CM | POA: Insufficient documentation

## 2017-11-03 DIAGNOSIS — E785 Hyperlipidemia, unspecified: Secondary | ICD-10-CM | POA: Diagnosis not present

## 2017-11-03 DIAGNOSIS — R918 Other nonspecific abnormal finding of lung field: Secondary | ICD-10-CM | POA: Diagnosis not present

## 2017-11-03 DIAGNOSIS — Z01810 Encounter for preprocedural cardiovascular examination: Secondary | ICD-10-CM | POA: Diagnosis not present

## 2017-11-03 LAB — MYOCARDIAL PERFUSION IMAGING
LV dias vol: 114 mL (ref 46–106)
LV sys vol: 52 mL
Peak HR: 100 {beats}/min
RATE: 0.37
Rest HR: 85 {beats}/min
SDS: 9
SRS: 7
SSS: 16
TID: 1

## 2017-11-03 MED ORDER — REGADENOSON 0.4 MG/5ML IV SOLN
0.4000 mg | Freq: Once | INTRAVENOUS | Status: AC
Start: 2017-11-03 — End: 2017-11-03
  Administered 2017-11-03: 0.4 mg via INTRAVENOUS

## 2017-11-03 MED ORDER — TECHNETIUM TC 99M TETROFOSMIN IV KIT
10.2000 | PACK | Freq: Once | INTRAVENOUS | Status: AC | PRN
  Administered 2017-11-03: 10.2 via INTRAVENOUS
  Filled 2017-11-03: qty 11

## 2017-11-03 MED ORDER — TECHNETIUM TC 99M TETROFOSMIN IV KIT
31.9000 | PACK | Freq: Once | INTRAVENOUS | Status: AC | PRN
  Administered 2017-11-03: 31.9 via INTRAVENOUS
  Filled 2017-11-03: qty 32

## 2017-11-08 ENCOUNTER — Telehealth: Payer: Self-pay | Admitting: Cardiothoracic Surgery

## 2017-11-08 ENCOUNTER — Encounter (HOSPITAL_COMMUNITY)
Admission: RE | Admit: 2017-11-08 | Discharge: 2017-11-08 | Disposition: A | Discharge status: Home / Self Care | Payer: Medicare Other | Source: Ambulatory Visit | Attending: Cardiothoracic Surgery | Admitting: Cardiothoracic Surgery

## 2017-11-08 DIAGNOSIS — R918 Other nonspecific abnormal finding of lung field: Secondary | ICD-10-CM | POA: Insufficient documentation

## 2017-11-08 LAB — GLUCOSE, CAPILLARY: Glucose-Capillary: 109 mg/dL — ABNORMAL HIGH (ref 70–99)

## 2017-11-08 MED ORDER — FLUDEOXYGLUCOSE F - 18 (FDG) INJECTION
9.1800 | Freq: Once | INTRAVENOUS | Status: AC | PRN
  Administered 2017-11-08: 9.18 via INTRAVENOUS

## 2017-11-08 NOTE — Telephone Encounter (Signed)
I called the patient today and reviewed the findings of her PET scan with her.  The PET scan is suspicious for at least stage IIIa carcinoma of the lung, right middle lobe.  There is a 1 cm hypermetabolic lymph node in the right supraclavicular area that may be amenable to biopsy with ultrasound guidance.  I have explained to the patient that we we will start with the most assessable biopsy site first, and if unsuccessful proceed on direct biopsy of the right middle lobe mass.  The patient's questions were answered.  We will make arrangements with radiology to schedule the patient. Grace Isaac MD      [address] [place] [address] Office [phone]   [place]

## 2017-11-09 ENCOUNTER — Other Ambulatory Visit: Payer: Self-pay | Admitting: *Deleted

## 2017-11-09 DIAGNOSIS — C770 Secondary and unspecified malignant neoplasm of lymph nodes of head, face and neck: Secondary | ICD-10-CM

## 2017-11-10 ENCOUNTER — Ambulatory Visit: Payer: Medicare Other | Admitting: Family Medicine

## 2017-11-16 ENCOUNTER — Other Ambulatory Visit: Payer: Self-pay | Admitting: Student

## 2017-11-17 ENCOUNTER — Other Ambulatory Visit: Payer: Self-pay

## 2017-11-17 ENCOUNTER — Other Ambulatory Visit: Payer: Self-pay | Admitting: Cardiothoracic Surgery

## 2017-11-17 ENCOUNTER — Ambulatory Visit (HOSPITAL_COMMUNITY)
Admission: RE | Admit: 2017-11-17 | Discharge: 2017-11-17 | Disposition: A | Discharge status: Home / Self Care | Payer: Medicare Other | Source: Ambulatory Visit | Attending: Cardiothoracic Surgery | Admitting: Cardiothoracic Surgery

## 2017-11-17 ENCOUNTER — Encounter (HOSPITAL_COMMUNITY): Payer: Self-pay

## 2017-11-17 DIAGNOSIS — I10 Essential (primary) hypertension: Secondary | ICD-10-CM | POA: Diagnosis not present

## 2017-11-17 DIAGNOSIS — Z79899 Other long term (current) drug therapy: Secondary | ICD-10-CM | POA: Insufficient documentation

## 2017-11-17 DIAGNOSIS — E039 Hypothyroidism, unspecified: Secondary | ICD-10-CM | POA: Insufficient documentation

## 2017-11-17 DIAGNOSIS — Z90710 Acquired absence of both cervix and uterus: Secondary | ICD-10-CM | POA: Insufficient documentation

## 2017-11-17 DIAGNOSIS — Z823 Family history of stroke: Secondary | ICD-10-CM | POA: Diagnosis not present

## 2017-11-17 DIAGNOSIS — Z9841 Cataract extraction status, right eye: Secondary | ICD-10-CM | POA: Diagnosis not present

## 2017-11-17 DIAGNOSIS — N184 Chronic kidney disease, stage 4 (severe): Secondary | ICD-10-CM | POA: Diagnosis not present

## 2017-11-17 DIAGNOSIS — C770 Secondary and unspecified malignant neoplasm of lymph nodes of head, face and neck: Secondary | ICD-10-CM

## 2017-11-17 DIAGNOSIS — I712 Thoracic aortic aneurysm, without rupture: Secondary | ICD-10-CM | POA: Diagnosis not present

## 2017-11-17 DIAGNOSIS — Z79890 Hormone replacement therapy (postmenopausal): Secondary | ICD-10-CM | POA: Diagnosis not present

## 2017-11-17 DIAGNOSIS — E785 Hyperlipidemia, unspecified: Secondary | ICD-10-CM | POA: Insufficient documentation

## 2017-11-17 DIAGNOSIS — I129 Hypertensive chronic kidney disease with stage 1 through stage 4 chronic kidney disease, or unspecified chronic kidney disease: Secondary | ICD-10-CM | POA: Diagnosis not present

## 2017-11-17 DIAGNOSIS — Z801 Family history of malignant neoplasm of trachea, bronchus and lung: Secondary | ICD-10-CM | POA: Insufficient documentation

## 2017-11-17 DIAGNOSIS — D649 Anemia, unspecified: Secondary | ICD-10-CM | POA: Insufficient documentation

## 2017-11-17 DIAGNOSIS — R590 Localized enlarged lymph nodes: Secondary | ICD-10-CM | POA: Insufficient documentation

## 2017-11-17 DIAGNOSIS — R918 Other nonspecific abnormal finding of lung field: Secondary | ICD-10-CM | POA: Diagnosis not present

## 2017-11-17 DIAGNOSIS — Z87891 Personal history of nicotine dependence: Secondary | ICD-10-CM | POA: Insufficient documentation

## 2017-11-17 DIAGNOSIS — Z9842 Cataract extraction status, left eye: Secondary | ICD-10-CM | POA: Diagnosis not present

## 2017-11-17 DIAGNOSIS — Z809 Family history of malignant neoplasm, unspecified: Secondary | ICD-10-CM | POA: Insufficient documentation

## 2017-11-17 DIAGNOSIS — Z888 Allergy status to other drugs, medicaments and biological substances status: Secondary | ICD-10-CM | POA: Insufficient documentation

## 2017-11-17 DIAGNOSIS — I700 Atherosclerosis of aorta: Secondary | ICD-10-CM | POA: Insufficient documentation

## 2017-11-17 DIAGNOSIS — Z8249 Family history of ischemic heart disease and other diseases of the circulatory system: Secondary | ICD-10-CM | POA: Insufficient documentation

## 2017-11-17 DIAGNOSIS — Z85820 Personal history of malignant melanoma of skin: Secondary | ICD-10-CM | POA: Insufficient documentation

## 2017-11-17 DIAGNOSIS — Z800 Family history of malignant neoplasm of digestive organs: Secondary | ICD-10-CM | POA: Diagnosis not present

## 2017-11-17 DIAGNOSIS — Z9889 Other specified postprocedural states: Secondary | ICD-10-CM | POA: Insufficient documentation

## 2017-11-17 HISTORY — PX: IR US GUIDE BX ASP/DRAIN: IMG2392

## 2017-11-17 LAB — APTT: aPTT: 30 seconds (ref 24–36)

## 2017-11-17 LAB — CBC
HCT: 35.8 % — ABNORMAL LOW (ref 36.0–46.0)
Hemoglobin: 11 g/dL — ABNORMAL LOW (ref 12.0–15.0)
MCH: 26.7 pg (ref 26.0–34.0)
MCHC: 30.7 g/dL (ref 30.0–36.0)
MCV: 86.9 fL (ref 78.0–100.0)
Platelets: 281 10*3/uL (ref 150–400)
RBC: 4.12 MIL/uL (ref 3.87–5.11)
RDW: 13.9 % (ref 11.5–15.5)
WBC: 6.7 10*3/uL (ref 4.0–10.5)

## 2017-11-17 LAB — PROTIME-INR
INR: 0.99
Prothrombin Time: 13 seconds (ref 11.4–15.2)

## 2017-11-17 MED ORDER — LIDOCAINE HCL 1 % IJ SOLN
INTRAMUSCULAR | Status: DC | PRN
  Administered 2017-11-17: 3 mL

## 2017-11-17 MED ORDER — FENTANYL CITRATE (PF) 100 MCG/2ML IJ SOLN
INTRAMUSCULAR | Status: AC
  Filled 2017-11-17: qty 2

## 2017-11-17 MED ORDER — SODIUM CHLORIDE 0.9 % IV SOLN
INTRAVENOUS | Status: DC
  Administered 2017-11-17: 15:00:00 via INTRAVENOUS

## 2017-11-17 MED ORDER — FENTANYL CITRATE (PF) 100 MCG/2ML IJ SOLN
INTRAMUSCULAR | Status: AC | PRN
  Administered 2017-11-17: 25 ug via INTRAVENOUS
  Administered 2017-11-17: 50 ug via INTRAVENOUS

## 2017-11-17 MED ORDER — MIDAZOLAM HCL 2 MG/2ML IJ SOLN
INTRAMUSCULAR | Status: AC | PRN
  Administered 2017-11-17: 1 mg via INTRAVENOUS

## 2017-11-17 MED ORDER — LIDOCAINE HCL 1 % IJ SOLN
INTRAMUSCULAR | Status: AC
  Filled 2017-11-17: qty 20

## 2017-11-17 MED ORDER — MIDAZOLAM HCL 2 MG/2ML IJ SOLN
INTRAMUSCULAR | Status: AC
  Filled 2017-11-17: qty 2

## 2017-11-17 NOTE — Sedation Documentation (Signed)
Patient is resting comfortably. 

## 2017-11-17 NOTE — Procedures (Signed)
Interventional Radiology Procedure Note  Procedure: US guided core biopsy right supraclavicular LN  Complications: None  Estimated Blood Loss: None  Recommendations: - Bedrest x 1 hr - DC home  Signed,  Criselda Peaches, MD

## 2017-11-17 NOTE — Sedation Documentation (Signed)
Patient denies pain and is resting comfortably.  

## 2017-11-17 NOTE — H&P (Signed)
Chief Complaint: Patient was seen in consultation today for right supraclavicular lymph node biopsy at the request of Gerhardt,Edward B  Referring Physician(s): Grace Isaac  Supervising Physician: Jacqulynn Cadet  Patient Status: [facility] - Out-pt  History of Present Illness: Lisa [name] is a 84 y.o. 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 Right middle lobe central thick walled cavitary lung mass is identified. In the absence of signs or symptoms of pneumonia findings are worrisome for a necrotic tumor. Associated postobstructive pneumonitis of the right middle lobe is noted. Recommend further evaluation with PET-CT and tissue sampling.  PET 11/08/17:  IMPRESSION: 1. Large cavitary right middle lobe lung mass is hypermetabolic and consistent with primary lung neoplasm. There is also adjacent right hilar adenopathy and subcarinal adenopathy. 2. 8 mm right supraclavicular lymph node is hypermetabolic and likely represents metastatic disease. This may be amenable to ultrasound-guided biopsy. 3. No definite findings for metastatic disease involving the liver or adrenal glands. 4. Elongated soft tissue nodule in a right lower quadrant hernia is indeterminate but most likely a benign inflammatory or postinflammatory/postsurgical process  Now scheduled for right [place] LN bx  Past Medical History:  Diagnosis Date  . Diverticulosis   . Hemorrhoids    internal and external  . Hyperlipidemia 01/29/1991  . Hypertension 10/29/1995  . Hypothyroidism 10/29/1995  . Melanoma ([place])    h/o, local excision. no chemo or rady.   . Skin cancer (melanoma) ([place])   . Tubular adenoma of colon     Past Surgical History:    Procedure Laterality Date  . cataract surgery  2007, 2008   repair bilat  . DG BARIUM ENEMA ([place] HX) N/A 2016   [name]  . HERNIA REPAIR  04/25/2001  . KNEE SURGERY     meniscus tear per Dr. Ronnie Derby, R knee  . SEPTOPLASTY  2006   Dr. Ernesto Rutherford  . SKIN CANCER EXCISION    . TONSILLECTOMY  1954  . VAGINAL HYSTERECTOMY      Allergies: Atorvastatin; Celebrex [celecoxib]; Cholecalciferol; and Simvastatin  Medications: Prior to Admission medications   Medication Sig Start Date End Date Taking? Authorizing Provider  amLODipine (NORVASC) 10 MG tablet Take 1 tablet (10 mg total) by mouth daily. 06/17/17  Yes [name], MD  Cholecalciferol (VITAMIN D) 2000 units tablet Take 2,000 Units by mouth 2 (two) times daily.   Yes [provider]  Coenzyme Q10 100 MG capsule Take 100 mg by mouth daily.    Yes [provider]  docusate sodium (COLACE) 100 MG capsule Take 100 mg by mouth daily as needed for mild constipation.   Yes [provider]  fluticasone (FLONASE) 50 MCG/ACT nasal spray USE 2 SPRAYS IN EACH NOSTRIL DAILY AS NEEDED FOR RHINITIS 06/17/17  Yes [name], MD  levothyroxine (SYNTHROID, LEVOTHROID) 75 MCG tablet Take 1 tablet (75 mcg total) by mouth daily. 06/17/17  Yes [name], MD  losartan-hydrochlorothiazide (HYZAAR) 100-25 MG tablet Take 1 tablet by mouth daily. 06/17/17  Yes [name], MD  sennosides-docusate sodium (SENOKOT-S) 8.6-50 MG tablet Take 1 tablet by mouth daily as needed for constipation.    [provider]     Family History  Problem  Relation Age of Onset  . Stroke Mother   . Heart disease Mother        CHF  . Stomach cancer Sister   . Cancer Brother        Metastatic cancer  . Colon cancer Neg Hx   . Breast cancer Neg Hx     Social History   Socioeconomic History  . Marital status: Married    Spouse name: Not on file  . Number of children: 2  . Years of education: Not on file  . Highest  education level: Not on file  Occupational History  . 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 Financial resource strain: Not on file  . Food insecurity:    Worry: Not on file    Inability: Not on file  . Transportation needs:    Medical: Not on file    Non-medical: Not on file  Tobacco Use  . Smoking status: Former Smoker    Packs/day: 1.00    Years: 30.00    Pack years: 30.00    Types: Cigarettes    Start date: 10/28/1958    Last attempt to quit: 10/27/1988    Years since quitting: 29.0  . Smokeless tobacco: Never Used  . Tobacco comment: quit 1994  Substance and Sexual Activity  . Alcohol use: No  . Drug use: No  . Sexual activity: Never  Lifestyle  . Physical activity:    Days per week: Not on file    Minutes per session: Not on file  . Stress: Not on file  Relationships  . Social connections:    Talks on phone: Not on file    Gets together: Not on file    Attends religious service: Not on file    Active member of club or organization: Not on file    Attends meetings of clubs or organizations: Not on file    Relationship status: Not on file  Other Topics Concern  . Not on file  Social History Narrative   Married 1957 lives with husband    Review of Systems: A 12 point ROS discussed and pertinent positives are indicated in the HPI above.  All other systems are negative.  Review of Systems  Constitutional: Positive for activity change, appetite change, fatigue and unexpected weight change. Negative for fever.  Respiratory: Positive for cough. Negative for shortness of breath.   Gastrointestinal: Negative for abdominal pain.  Musculoskeletal: Negative for back pain.  Neurological: Positive for weakness.  Psychiatric/Behavioral: Negative for behavioral problems and confusion.    Vital Signs: BP 132/74 (BP Location: Right Arm)   Pulse 86   Temp (!) 97.5 F (36.4 C) (Oral)   Ht 5\' 5"  (1.651 m)   Wt 182  lb (82.6 kg)   SpO2 100%   BMI 30.29 kg/m   Physical Exam  Constitutional: She is oriented to person, place, and time.  Cardiovascular: Normal rate, regular rhythm and normal heart sounds.  Pulmonary/Chest: Effort normal and breath sounds normal.  Abdominal: Soft. Bowel sounds are normal.  Musculoskeletal: Normal range of motion.  Neurological: She is alert and oriented to person, place, and time.  Skin: Skin is warm and dry.  Psychiatric: She has a normal mood and affect. Her behavior is normal. Judgment and thought content normal.  Nursing note and vitals reviewed.   Imaging: Dg Chest 2 View  Result Date: 10/26/2017 CLINICAL DATA:  Cough for 2-3 months.  Diminished right lower lobe breath sounds. EXAM: CHEST - 2 VIEW COMPARISON:  07/23/2005 FINDINGS: The cardiac silhouette is normal in size. Thoracic aortic tortuosity and atherosclerotic calcification are noted. There is masslike opacity in the right middle lobe with focal convex margin along the minor fissure and with associated right middle lobe volume loss. Mild scarring or atelectasis is noted in the left lung base. There is a background of chronic mild lung hyperinflation. There is no pleural effusion or pneumothorax. An old posterior right fourth rib fracture is again noted. IMPRESSION: Right middle lobe volume loss with suspected underlying mass highly concerning for neoplasm. Further evaluation with chest CT is recommended. These results will be called to the ordering clinician or representative by the Radiologist Assistant, and communication documented in the PACS or zVision Dashboard. Electronically Signed   By: Logan Bores M.D.   On: 10/26/2017 09:10   Ct Chest Wo Contrast  Result Date: 10/26/2017 CLINICAL DATA:  Evaluate right lung mass identified on chest radiograph. EXAM: CT CHEST WITHOUT CONTRAST TECHNIQUE: Multidetector CT imaging of the chest was performed following the standard protocol without IV contrast. COMPARISON:   None FINDINGS: Cardiovascular: The heart size appears normal. No pericardial effusion. Aortic atherosclerosis. Ascending aortic aneurysm measures 5.1 cm, image 74/2. Mediastinum/Nodes: Normal appearance of the thyroid gland. Normal appearance of the esophagus. The trachea appears patent and is midline. No enlarged mediastinal lymph nodes identified. Lungs/Pleura: Small right pleural effusion identified. There is a large central thick walled cavitary mass within the right middle lobe which measures 5.3 cm in maximum dimension. The mass obstructs the right middle lobe bronchus resulting in postobstructive atelectasis. Evaluation of the right hilar region is diminished due to lack of IV contrast material. Upper Abdomen: No acute abnormality. There are multiple cysts identified within the kidneys which are incompletely characterized without IV contrast material. Musculoskeletal: No aggressive lytic or sclerotic bone lesions. Multi level thoracic spondylosis identified. An inferior endplate Schmorl's node deformity is identified within the lower thoracic spine. IMPRESSION: 1. Right middle lobe central thick walled cavitary lung mass is identified. In the absence of signs or symptoms of pneumonia findings are worrisome for a necrotic tumor. Associated postobstructive pneumonitis of the right middle lobe is noted. Recommend further evaluation with PET-CT and tissue sampling. 2. Ascending thoracic aortic aneurysm. Ascending thoracic aortic aneurysm. Recommend semi-annual imaging followup by CTA or MRA and referral to cardiothoracic surgery if not already obtained. This recommendation follows 2010 ACCF/AHA/AATS/ACR/ASA/SCA/SCAI/SIR/STS/SVM Guidelines for the Diagnosis and Management of Patients With Thoracic Aortic Disease. Circulation. 2010; 121: Q008-Q761. Aortic aneurysm NOS (ICD10-I71.9). Aortic Atherosclerosis (ICD10-I70.0). Electronically Signed   By: Kerby Moors M.D.   On: 10/26/2017 14:29   Nm Pet Image Initial  (pi) Skull Base To Thigh  Result Date: 11/08/2017 CLINICAL DATA:  Initial treatment strategy for cavitary right lung mass. EXAM: NUCLEAR MEDICINE PET SKULL BASE TO THIGH TECHNIQUE: 9.18 mCi F-18 FDG was injected intravenously. Full-ring PET imaging was performed from the skull base to thigh after the radiotracer. CT data was obtained and used for attenuation correction and anatomic localization. Fasting blood glucose: 109 mg/dl COMPARISON:  Chest CT 10/26/2017 FINDINGS: Mediastinal blood pool activity: SUV max 2.93 NECK: No hypermetabolic lymph nodes in the neck. Incidental CT findings: none CHEST: 9 mm supraclavicular lymph node on the right side on image number 40 is hypermetabolic with SUV max of 9.50 and may be amenable to ultrasound-guided percutaneous biopsy. Large (6.5 x 5.0 cm) cavitary right middle lobe lung lesion is hypermetabolic with  SUV max of 29.01. Adjacent right hilar lymphadenopathy with SUV max of 7.2. 8 mm subcarinal lymph node on image number 68 is hypermetabolic with SUV max of 0.32. No other pulmonary nodules or hypermetabolic mediastinal lymph nodes. Incidental CT findings: Postobstructive pneumonitis is noted in the right middle lobe. Stable ascending thoracic aortic aneurysm with dense calcification. Maximum measures 5.1 cm. ABDOMEN/PELVIS: Small focus of hypermetabolism near the inferior margin of the left adrenal gland but no obvious nodule. SUV max is 3.65 but I do not think this represents metastatic disease. No hepatic lesions and no abdominal/pelvic lymphadenopathy. In the right lower quadrant/pelvis there is a spigelian type hernia on the right side with a soft tissue nodule. I think it is possible this is some type of postoperative repair/plug with a small recurrent hernia. This is hypermetabolic with SUV max of 12.2. I doubt this is metastatic disease. It is much more likely some type of postinflammatory or post surgical process. Incidental CT findings: Advanced atherosclerotic  calcifications involving the aorta iliac arteries. Multiple renal cysts are noted. SKELETON: Diffuse patchy marrow activity possibly related to anemia. I do not see a discrete bone lesions to suggest metastatic disease. Incidental CT findings: none IMPRESSION: 1. Large cavitary right middle lobe lung mass is hypermetabolic and consistent with primary lung neoplasm. There is also adjacent right hilar adenopathy and subcarinal adenopathy. 2. 8 mm right supraclavicular lymph node is hypermetabolic and likely represents metastatic disease. This may be amenable to ultrasound-guided biopsy. 3. No definite findings for metastatic disease involving the liver or adrenal glands. 4. Elongated soft tissue nodule in a right lower quadrant hernia is indeterminate but most likely a benign inflammatory or postinflammatory/postsurgical process. Electronically Signed   By: Marijo Sanes M.D.   On: 11/08/2017 15:19    Labs:  CBC: Recent Labs    10/25/17 1622 11/17/17 1149  WBC 11.1* 6.7  HGB 10.9* 11.0*  HCT 34.2* 35.8*  PLT 401.0* 281    COAGS: No results for input(s): INR, APTT in the last 8760 hours.  BMP: Recent Labs    06/15/17 1002 10/25/17 1622  NA 142 137  K 3.8 4.2  CL 104 99  CO2 30 29  GLUCOSE 87 98  BUN 22 27*  CALCIUM 10.3 10.6*  CREATININE 1.51* 1.67*    LIVER FUNCTION TESTS: Recent Labs    06/15/17 1002 10/25/17 1622  BILITOT 0.5 0.5  AST 17 45*  ALT 10 42*  ALKPHOS 56 162*  PROT 7.1 7.8  ALBUMIN 4.0 3.7    TUMOR MARKERS: No results for input(s): AFPTM, CEA, CA199, CHROMGRNA in the last 8760 hours.  Assessment and Plan:  Right supraclavicular lymph node biopsy Risks and benefits discussed with the patient including, but not limited to bleeding, infection, damage to adjacent structures or low yield requiring additional tests.  All of the patient's questions were answered, patient is agreeable to proceed. Consent signed and in chart.   Thank you for this  interesting consult.  I greatly enjoyed meeting ANAH BILLARD and look forward to participating in their care.  A copy of this report was sent to the requesting provider on this date.  Electronically Signed: Lavonia Drafts, PA-C 11/17/2017, 12:20 PM   I spent a total of  30 Minutes   in face to face in clinical consultation, greater than 50% of which was counseling/coordinating care for R SCLN bx

## 2017-11-17 NOTE — Discharge Instructions (Signed)

## 2017-11-19 ENCOUNTER — Other Ambulatory Visit: Payer: Self-pay | Admitting: *Deleted

## 2017-11-19 DIAGNOSIS — J984 Other disorders of lung: Secondary | ICD-10-CM

## 2017-11-22 ENCOUNTER — Encounter (HOSPITAL_COMMUNITY): Payer: Self-pay | Admitting: *Deleted

## 2017-11-22 ENCOUNTER — Other Ambulatory Visit: Payer: Self-pay

## 2017-11-22 NOTE — Progress Notes (Signed)
Anesthesia Chart Review: SAME DAY WORKUP   Case:  [id number] Date/Time:  11/23/17 1430   Procedures:      VIDEO BRONCHOSCOPY WITH ENDOBRONCHIAL ULTRASOUND (N/A )     POSSIBLE VIDEO BRONCHOSCOPY WITH ENDOBRONCHIAL NAVIGATION (N/A )   Anesthesia type:  General   Pre-op diagnosis:  RML LUNG MASS   Location:  [facility] OR ROOM 10 / [place] [facility]   Surgeon:  Grace Isaac, MD      DISCUSSION: 84 yo female former smoker for above procedure. Pertinent hx includes Hypothyroid, HLD, HTN.  Pt was seen by cardiology [name], NP 11/01/2017 for preop clearance. At that visit she advised "She is not able to do 4 mets of activity at this time - given her age, risk factors will arrange for Lexiscan.". She subsequently had Lexiscan 11/03/2017 that was determined to be Low Risk by Dr. Burt Knack.  Anticipate she can proceed with surgery as planned barring acute status change.  VS: There were no vitals taken for this visit.  PROVIDERS: [name], MD is PCP  Sherren Mocha, MD is Cardiologist, last seen in office by [name], NP 11/01/2017  LABS: Most recent labs displayed, will need DOS labs. Lab Results  Component Value Date   WBC 6.7 11/17/2017   HGB 11.0 (L) 11/17/2017   HCT 35.8 (L) 11/17/2017   PLT 281 11/17/2017   GLUCOSE 98 10/25/2017   CHOL 234 (H) 06/15/2017   TRIG 143.0 06/15/2017   HDL 47.10 06/15/2017   LDLDIRECT 156.0 06/05/2015   LDLCALC 158 (H) 06/15/2017   ALT 42 (H) 10/25/2017   AST 45 (H) 10/25/2017   NA 137 10/25/2017   K 4.2 10/25/2017   CL 99 10/25/2017   CREATININE 1.67 (H) 10/25/2017   BUN 27 (H) 10/25/2017   CO2 29 10/25/2017   TSH 2.08 08/19/2017   INR 0.99 11/17/2017    IMAGES: NM PET Imaging 11/08/2017: IMPRESSION: 1. Large cavitary right middle lobe lung mass is hypermetabolic and consistent with primary lung neoplasm. There is also adjacent right hilar adenopathy and subcarinal adenopathy. 2. 8 mm right supraclavicular lymph node is hypermetabolic  and likely represents metastatic disease. This may be amenable to ultrasound-guided biopsy. 3. No definite findings for metastatic disease involving the liver or adrenal glands. 4. Elongated soft tissue nodule in a right lower quadrant hernia is indeterminate but most likely a benign inflammatory or postinflammatory/postsurgical process.  EKG: 11/01/2017: Normal sinus rhythm.  Left anterior fascicular block.  Septal infarct, age undetermined.  No significant change.   CV: Lexiscan 11/03/2017:  Nuclear stress EF: 54%.  There was no ST segment deviation noted during stress.  Defect 1: There is a small defect of severe severity present in the apical septal and apex location.  This is a low risk study.  The left ventricular ejection fraction is mildly decreased (45-54%).   Mildly abnormal, low risk stress nuclear study with prominent apical thinning versus prior infarct.  No ischemia.  Ejection fraction low normal at 54%.  Normal wall motion.  Echo 10/29/2017: Study Conclusions  - Left ventricle: Global longitudinal strain is -20.4% Mild inferor   and inferoseptal hypokinesis. The cavity size was normal. Wall   thickness was increased in a pattern of mild LVH. Systolic   function was normal. The estimated ejection fraction was in the   range of 50% to 55%. - Aortic valve: There was mild regurgitation. - Aorta: Aortc root 35 mm Sinotubular junction is 31 mm. Ascending   aorta is  dilated at 47 mm. - Mitral valve: There was mild regurgitation. - Pulmonary arteries: PA peak pressure: 33 mm Hg (S).    Past Medical History:  Diagnosis Date  . Diverticulosis   . Hemorrhoids    internal and external  . Hyperlipidemia 01/29/1991  . Hypertension 10/29/1995  . Hypothyroidism 10/29/1995  . Melanoma ([place])    h/o, local excision. no chemo or rady.   . Skin cancer (melanoma) ([place])   . Tubular adenoma of colon     Past Surgical History:  Procedure Laterality Date  . cataract  surgery  2007, 2008   repair bilat  . DG BARIUM ENEMA ([place] HX) N/A 2016   [name]  . HERNIA REPAIR  04/25/2001  . IR US GUIDE BX ASP/DRAIN  11/17/2017  . KNEE SURGERY     meniscus tear per Dr. Ronnie Derby, R knee  . SEPTOPLASTY  2006   Dr. Ernesto Rutherford  . SKIN CANCER EXCISION    . TONSILLECTOMY  1954  . VAGINAL HYSTERECTOMY      MEDICATIONS: No current facility-administered medications for this encounter.    Marland Kitchen amLODipine (NORVASC) 10 MG tablet  . Cholecalciferol (VITAMIN D) 2000 units tablet  . Coenzyme Q10 100 MG capsule  . docusate sodium (COLACE) 100 MG capsule  . fluticasone (FLONASE) 50 MCG/ACT nasal spray  . levothyroxine (SYNTHROID, LEVOTHROID) 75 MCG tablet  . losartan-hydrochlorothiazide (HYZAAR) 100-25 MG tablet  . sennosides-docusate sodium (SENOKOT-S) 8.6-50 MG tablet    Wynonia Musty [facility] Short Stay Center/Anesthesiology Phone [phone] 11/22/2017 9:52 AM

## 2017-11-22 NOTE — Progress Notes (Signed)
Pt denies SOB and chest pain. Pt under the care of Dr. Burt Knack, Cardiology. Pt denies having a cardiac cath. Pt made aware to stop taking vitamins, fish oil, CO Q 10 and herbal medications. Do not take any NSAIDs ie: Ibuprofen, Advil, Naproxen (Aleve), Motrin, BC and Goody Powder. Pt verbalized understanding of all pre-op instructions. Anesthesia asked to review pt cardiac history (see note).

## 2017-11-23 ENCOUNTER — Ambulatory Visit (HOSPITAL_COMMUNITY): Payer: Medicare Other | Admitting: Physician Assistant

## 2017-11-23 ENCOUNTER — Ambulatory Visit (HOSPITAL_COMMUNITY): Payer: Medicare Other

## 2017-11-23 ENCOUNTER — Encounter (HOSPITAL_COMMUNITY)
Admission: RE | Disposition: A | Discharge status: Home / Self Care | Payer: Self-pay | Source: Ambulatory Visit | Attending: Cardiothoracic Surgery

## 2017-11-23 ENCOUNTER — Encounter (HOSPITAL_COMMUNITY): Payer: Self-pay | Admitting: Urology

## 2017-11-23 ENCOUNTER — Telehealth: Payer: Self-pay | Admitting: *Deleted

## 2017-11-23 ENCOUNTER — Ambulatory Visit (HOSPITAL_COMMUNITY)
Admission: RE | Admit: 2017-11-23 | Discharge: 2017-11-23 | Disposition: A | Discharge status: Home / Self Care | Payer: Medicare Other | Source: Ambulatory Visit | Attending: Cardiothoracic Surgery | Admitting: Cardiothoracic Surgery

## 2017-11-23 DIAGNOSIS — R911 Solitary pulmonary nodule: Secondary | ICD-10-CM | POA: Diagnosis present

## 2017-11-23 DIAGNOSIS — C342 Malignant neoplasm of middle lobe, bronchus or lung: Secondary | ICD-10-CM | POA: Insufficient documentation

## 2017-11-23 DIAGNOSIS — Z9842 Cataract extraction status, left eye: Secondary | ICD-10-CM | POA: Insufficient documentation

## 2017-11-23 DIAGNOSIS — Z87891 Personal history of nicotine dependence: Secondary | ICD-10-CM | POA: Diagnosis not present

## 2017-11-23 DIAGNOSIS — Z79899 Other long term (current) drug therapy: Secondary | ICD-10-CM | POA: Insufficient documentation

## 2017-11-23 DIAGNOSIS — Z85828 Personal history of other malignant neoplasm of skin: Secondary | ICD-10-CM | POA: Diagnosis not present

## 2017-11-23 DIAGNOSIS — Z800 Family history of malignant neoplasm of digestive organs: Secondary | ICD-10-CM | POA: Insufficient documentation

## 2017-11-23 DIAGNOSIS — Z85820 Personal history of malignant melanoma of skin: Secondary | ICD-10-CM | POA: Insufficient documentation

## 2017-11-23 DIAGNOSIS — Z9889 Other specified postprocedural states: Secondary | ICD-10-CM | POA: Insufficient documentation

## 2017-11-23 DIAGNOSIS — R590 Localized enlarged lymph nodes: Secondary | ICD-10-CM | POA: Diagnosis not present

## 2017-11-23 DIAGNOSIS — Z823 Family history of stroke: Secondary | ICD-10-CM | POA: Insufficient documentation

## 2017-11-23 DIAGNOSIS — J984 Other disorders of lung: Secondary | ICD-10-CM

## 2017-11-23 DIAGNOSIS — Z01818 Encounter for other preprocedural examination: Secondary | ICD-10-CM

## 2017-11-23 DIAGNOSIS — R846 Abnormal cytological findings in specimens from respiratory organs and thorax: Secondary | ICD-10-CM | POA: Diagnosis not present

## 2017-11-23 DIAGNOSIS — R918 Other nonspecific abnormal finding of lung field: Secondary | ICD-10-CM | POA: Diagnosis not present

## 2017-11-23 DIAGNOSIS — Z7951 Long term (current) use of inhaled steroids: Secondary | ICD-10-CM | POA: Diagnosis not present

## 2017-11-23 DIAGNOSIS — Z9841 Cataract extraction status, right eye: Secondary | ICD-10-CM | POA: Insufficient documentation

## 2017-11-23 DIAGNOSIS — I10 Essential (primary) hypertension: Secondary | ICD-10-CM | POA: Insufficient documentation

## 2017-11-23 DIAGNOSIS — Z90710 Acquired absence of both cervix and uterus: Secondary | ICD-10-CM | POA: Diagnosis not present

## 2017-11-23 DIAGNOSIS — R848 Other abnormal findings in specimens from respiratory organs and thorax: Secondary | ICD-10-CM | POA: Diagnosis not present

## 2017-11-23 DIAGNOSIS — Z801 Family history of malignant neoplasm of trachea, bronchus and lung: Secondary | ICD-10-CM | POA: Diagnosis not present

## 2017-11-23 DIAGNOSIS — Z888 Allergy status to other drugs, medicaments and biological substances status: Secondary | ICD-10-CM | POA: Diagnosis not present

## 2017-11-23 DIAGNOSIS — E039 Hypothyroidism, unspecified: Secondary | ICD-10-CM | POA: Diagnosis not present

## 2017-11-23 HISTORY — PX: VIDEO BRONCHOSCOPY WITH ENDOBRONCHIAL ULTRASOUND: SHX6177

## 2017-11-23 HISTORY — DX: Presence of dental prosthetic device (complete) (partial): Z97.2

## 2017-11-23 HISTORY — PX: BRONCHIAL BIOPSY: SHX5109

## 2017-11-23 HISTORY — DX: Other nonspecific abnormal finding of lung field: R91.8

## 2017-11-23 HISTORY — DX: Headache, unspecified: R51.9

## 2017-11-23 HISTORY — DX: Headache: R51

## 2017-11-23 LAB — COMPREHENSIVE METABOLIC PANEL
ALT: 11 U/L (ref 0–44)
AST: 19 U/L (ref 15–41)
Albumin: 3.3 g/dL — ABNORMAL LOW (ref 3.5–5.0)
Alkaline Phosphatase: 65 U/L (ref 38–126)
Anion gap: 14 (ref 5–15)
BUN: 15 mg/dL (ref 8–23)
CO2: 24 mmol/L (ref 22–32)
Calcium: 10.2 mg/dL (ref 8.9–10.3)
Chloride: 100 mmol/L (ref 98–111)
Creatinine, Ser: 1.54 mg/dL — ABNORMAL HIGH (ref 0.44–1.00)
GFR calc Af Amer: 35 mL/min — ABNORMAL LOW (ref >60)
GFR calc non Af Amer: 30 mL/min — ABNORMAL LOW (ref >60)
Glucose, Bld: 98 mg/dL (ref 70–99)
Potassium: 3.4 mmol/L — ABNORMAL LOW (ref 3.5–5.1)
Sodium: 138 mmol/L (ref 135–145)
Total Bilirubin: 0.7 mg/dL (ref 0.3–1.2)
Total Protein: 7.1 g/dL (ref 6.5–8.1)

## 2017-11-23 SURGERY — BRONCHOSCOPY, WITH EBUS
Anesthesia: General | Site: Thoracic

## 2017-11-23 MED ORDER — EPHEDRINE SULFATE 50 MG/ML IJ SOLN
INTRAMUSCULAR | Status: DC | PRN
  Administered 2017-11-23: 5 mg via INTRAVENOUS

## 2017-11-23 MED ORDER — PHENYLEPHRINE 40 MCG/ML (10ML) SYRINGE FOR IV PUSH (FOR BLOOD PRESSURE SUPPORT)
PREFILLED_SYRINGE | INTRAVENOUS | Status: AC
  Filled 2017-11-23: qty 20

## 2017-11-23 MED ORDER — MIDAZOLAM HCL 2 MG/2ML IJ SOLN
INTRAMUSCULAR | Status: AC
  Filled 2017-11-23: qty 2

## 2017-11-23 MED ORDER — EPINEPHRINE PF 1 MG/ML IJ SOLN
INTRAMUSCULAR | Status: DC | PRN
  Administered 2017-11-23: 1 mg

## 2017-11-23 MED ORDER — SUGAMMADEX SODIUM 200 MG/2ML IV SOLN
INTRAVENOUS | Status: DC | PRN
  Administered 2017-11-23: 200 mg via INTRAVENOUS

## 2017-11-23 MED ORDER — ONDANSETRON HCL 4 MG/2ML IJ SOLN
INTRAMUSCULAR | Status: AC
  Filled 2017-11-23: qty 4

## 2017-11-23 MED ORDER — DEXAMETHASONE SODIUM PHOSPHATE 10 MG/ML IJ SOLN
INTRAMUSCULAR | Status: DC | PRN
  Administered 2017-11-23: 5 mg via INTRAVENOUS

## 2017-11-23 MED ORDER — MIDAZOLAM HCL 2 MG/2ML IJ SOLN
INTRAMUSCULAR | Status: DC | PRN
  Administered 2017-11-23: .5 mg via INTRAVENOUS

## 2017-11-23 MED ORDER — LIDOCAINE 2% (20 MG/ML) 5 ML SYRINGE
INTRAMUSCULAR | Status: AC
  Filled 2017-11-23: qty 5

## 2017-11-23 MED ORDER — FENTANYL CITRATE (PF) 250 MCG/5ML IJ SOLN
INTRAMUSCULAR | Status: AC
  Filled 2017-11-23: qty 5

## 2017-11-23 MED ORDER — DEXAMETHASONE SODIUM PHOSPHATE 10 MG/ML IJ SOLN
INTRAMUSCULAR | Status: AC
  Filled 2017-11-23: qty 2

## 2017-11-23 MED ORDER — SODIUM CHLORIDE 0.9 % IV SOLN
INTRAVENOUS | Status: DC | PRN
  Administered 2017-11-23: 30 ug/min via INTRAVENOUS

## 2017-11-23 MED ORDER — PROPOFOL 10 MG/ML IV BOLUS
INTRAVENOUS | Status: AC
  Filled 2017-11-23: qty 20

## 2017-11-23 MED ORDER — ONDANSETRON HCL 4 MG/2ML IJ SOLN
INTRAMUSCULAR | Status: DC | PRN
  Administered 2017-11-23: 4 mg via INTRAVENOUS

## 2017-11-23 MED ORDER — LACTATED RINGERS IV SOLN
INTRAVENOUS | Status: DC
  Administered 2017-11-23: 12:00:00 via INTRAVENOUS

## 2017-11-23 MED ORDER — PHENYLEPHRINE HCL 10 MG/ML IJ SOLN
INTRAMUSCULAR | Status: DC | PRN
  Administered 2017-11-23 (×5): 80 ug via INTRAVENOUS

## 2017-11-23 MED ORDER — EPINEPHRINE PF 1 MG/ML IJ SOLN
INTRAMUSCULAR | Status: AC
  Filled 2017-11-23: qty 2

## 2017-11-23 MED ORDER — LIDOCAINE 2% (20 MG/ML) 5 ML SYRINGE
INTRAMUSCULAR | Status: DC | PRN
  Administered 2017-11-23: 80 mg via INTRAVENOUS

## 2017-11-23 MED ORDER — PROPOFOL 10 MG/ML IV BOLUS
INTRAVENOUS | Status: DC | PRN
  Administered 2017-11-23: 120 mg via INTRAVENOUS

## 2017-11-23 MED ORDER — ROCURONIUM BROMIDE 10 MG/ML (PF) SYRINGE
PREFILLED_SYRINGE | INTRAVENOUS | Status: DC | PRN
  Administered 2017-11-23: 50 mg via INTRAVENOUS
  Administered 2017-11-23: 20 mg via INTRAVENOUS
  Administered 2017-11-23: 10 mg via INTRAVENOUS

## 2017-11-23 MED ORDER — FENTANYL CITRATE (PF) 250 MCG/5ML IJ SOLN
INTRAMUSCULAR | Status: DC | PRN
  Administered 2017-11-23: 50 ug via INTRAVENOUS

## 2017-11-23 MED ORDER — 0.9 % SODIUM CHLORIDE (POUR BTL) OPTIME
TOPICAL | Status: DC | PRN
  Administered 2017-11-23: 1000 mL

## 2017-11-23 MED ORDER — LIDOCAINE HCL (PF) 1 % IJ SOLN
INTRAMUSCULAR | Status: AC
  Filled 2017-11-23: qty 30

## 2017-11-23 SURGICAL SUPPLY — 47 items
ADAPTER BRONCH F/PENTAX (ADAPTER) ×2 IMPLANT
ADPR BSCP EDG PNTX (ADAPTER)
BRUSH BIOPSY BRONCH 10 SDTNB (MISCELLANEOUS) IMPLANT
BRUSH CYTOL CELLEBRITY 1.5X140 (MISCELLANEOUS) ×4 IMPLANT
BRUSH SUPERTRAX BIOPSY (INSTRUMENTS) IMPLANT
BRUSH SUPERTRAX NDL-TIP CYTO (INSTRUMENTS) IMPLANT
CANISTER SUCT 3000ML PPV (MISCELLANEOUS) ×6 IMPLANT
CHANNEL WORK EXTEND EDGE 180 (KITS) IMPLANT
CHANNEL WORK EXTEND EDGE 45 (KITS) IMPLANT
CHANNEL WORK EXTEND EDGE 90 (KITS) IMPLANT
CONT SPEC 4OZ CLIKSEAL STRL BL (MISCELLANEOUS) ×8 IMPLANT
COVER BACK TABLE 60X90IN (DRAPES) ×6 IMPLANT
COVER DOME SNAP 22 D (MISCELLANEOUS) ×6 IMPLANT
FILTER STRAW FLUID ASPIR (MISCELLANEOUS) IMPLANT
FORCEPS BIOP RJ4 1.8 (CUTTING FORCEPS) IMPLANT
FORCEPS BIOP SUPERTRX PREMAR (INSTRUMENTS) IMPLANT
FORCEPS RADIAL JAW LRG 4 PULM (INSTRUMENTS) ×1 IMPLANT
GAUZE SPONGE 4X4 12PLY STRL (GAUZE/BANDAGES/DRESSINGS) ×6 IMPLANT
GLOVE BIO SURGEON STRL SZ 6.5 (GLOVE) ×6 IMPLANT
KIT CLEAN ENDO COMPLIANCE (KITS) ×12 IMPLANT
KIT PROCEDURE EDGE 180 (KITS) IMPLANT
KIT PROCEDURE EDGE 45 (KITS) IMPLANT
KIT PROCEDURE EDGE 90 (KITS) IMPLANT
KIT TURNOVER KIT B (KITS) ×6 IMPLANT
MARKER SKIN DUAL TIP RULER LAB (MISCELLANEOUS) ×6 IMPLANT
NDL EBUS SONO TIP PENTAX (NEEDLE) ×2 IMPLANT
NDL FILTER BLUNT 18X1 1/2 (NEEDLE) IMPLANT
NDL SUPERTRX PREMARK BIOPSY (NEEDLE) IMPLANT
NEEDLE EBUS SONO TIP PENTAX (NEEDLE) IMPLANT
NEEDLE FILTER BLUNT 18X 1/2SAF (NEEDLE) ×2
NEEDLE FILTER BLUNT 18X1 1/2 (NEEDLE) ×6 IMPLANT
NEEDLE SUPERTRX PREMARK BIOPSY (NEEDLE) IMPLANT
NS IRRIG 1000ML POUR BTL (IV SOLUTION) ×6 IMPLANT
OIL SILICONE PENTAX (PARTS (SERVICE/REPAIRS)) ×6 IMPLANT
PAD ARMBOARD 7.5X6 YLW CONV (MISCELLANEOUS) ×16 IMPLANT
PATCHES PATIENT (LABEL) ×12 IMPLANT
RADIAL JAW LRG 4 PULMONARY (INSTRUMENTS) ×1
SYR 20CC LL (SYRINGE) ×4 IMPLANT
SYR 20ML ECCENTRIC (SYRINGE) ×6 IMPLANT
SYR 3ML LL SCALE MARK (SYRINGE) ×4 IMPLANT
TOWEL GREEN STERILE (TOWEL DISPOSABLE) ×6 IMPLANT
TOWEL GREEN STERILE FF (TOWEL DISPOSABLE) ×6 IMPLANT
TRAP SPECIMEN MUCOUS 40CC (MISCELLANEOUS) ×12 IMPLANT
TUBE CONNECTING 20X1/4 (TUBING) ×6 IMPLANT
UNDERPAD 30X30 (UNDERPADS AND DIAPERS) ×2 IMPLANT
VALVE DISPOSABLE (MISCELLANEOUS) ×8 IMPLANT
WATER STERILE IRR 1000ML POUR (IV SOLUTION) ×6 IMPLANT

## 2017-11-23 NOTE — Op Note (Signed)
NAME: Lisa [name], VANVLECK MEDICAL RECORD ST:[id number] ACCOUNT [id number] DATE OF BIRTH:[birth year] FACILITY: [facility] LOCATION: [place], [place]  OPERATIVE REPORT  DATE OF PROCEDURE:  11/23/2017  PREOPERATIVE DIAGNOSES:  Right lung mass with hilar adenopathy.  POSTOPERATIVE DIAGNOSES:  Right lung mass with hilar adenopathy.  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  PROCEDURE PERFORMED:  Video bronchoscopy with biopsy of right middle lobe and endobronchial ultrasound with transbronchial biopsies.  SURGEON:  Lanelle Bal, MD  BRIEF HISTORY:  The patient is an 84 year old female who presented with increasing fatigue, shortness of breath.  Chest x-ray showed a right lung mass leading to a CT scan of the chest and PET scan with findings highly suspicious of a large right middle  lobe lung mass with hilar involvement.  In addition, there was hypermetabolic right supraclavicular node.  Preoperatively, attempt at ultrasound guidance of a needle biopsy of the right supraclavicular node was done; however, this revealed benign muscle  tissue.  We then recommended to the patient that we proceed with bronchoscopy with EBUS to obtain a tissue diagnosis.  The patient agreed and signed informed consent.  DESCRIPTION OF PROCEDURE:  The patient underwent general endotracheal anesthesia with a single-lumen endotracheal tube without difficulty.  She remained hemodynamically stable.  Appropriate timeout was performed, and then we proceeded with first a video  bronchoscopy to the subsegmental level, both the right and left tracheobronchial tree.  The right main stem bronchus appeared normal.  The left main stem bronchus appeared normal.  As we proceeded down the left main stem bronchus, the upper lobe and  lower lobe bronchi on the left were free of any endobronchial lesions.  The scope was then passed into the right main stem bronchus.  The right upper lobe bronchus was free of any  obstruction or endobronchial lesions.  The right middle lobe bronchus was  inflamed and narrowed slightly.  The lower lobe bronchus was free of obstruction.  As we passed the scope into the right middle lobe bronchus, the distal bronchus was narrowed some with some significant secretions coming from the lateral segment.  There  was an area of narrowing.  Brushings were taken of the lateral segment of the middle lobe.  While we were waiting for pathologic examination of these smears, we then placed an EBUS scope through the endotracheal tube.  The #7 node and the right  peritracheal nodes did not appear enlarged.  As we moved into the bronchus intermedius, descending at the takeoff of the right upper lobe, there was a mildly enlarged lymph node.  Transbronchial biopsies with a 21-gauge transbronchial needle were  performed.  Two passes of this area were done and submitted to pathology.  As I moved the EBUS scope lower into the lower and middle lobe, we did not see significantly enlarged nodes that were suitable to biopsy.  The initial smears of the middle lobe  were negative for malignancy.  The middle ____ node showed mostly blood, but the report of atypical cells were seen.  We went back with the standard video bronchoscope and did further brushings into the lateral segment of the middle lobe.  In addition,  multiple biopsies of the area with small biopsy forceps were also obtained.  The second attempted brushings of this area revealed nonsmall-cell cancer.  The remaining biopsies were sent for permanent section, labeled right middle lobe.  The  tracheobronchial tree was cleared of all secretions.  The patient's scopes were removed.  The patient was awakened and  extubated in the operating room.  She was transferred to the recovery room for postoperative care.  She tolerated the procedure without  obvious complication.  LN/NUANCE  D:11/23/2017 T:11/23/2017 JOB:[id number]

## 2017-11-23 NOTE — H&P (Signed)
[address]       [name] [address]             [phone]                    Airelle B Buell [place] Medical Record #[id number] Date of Birth: Dec 23, 1932   Referring: Jearld Fenton, NP Primary Care: [name], MD Primary Cardiologist: No primary care provider on file.  Chief Complaint:    Chief Complaint  Patient presents with  . Lung Mass    .Marland KitchenRMLobe...CT CHEST  10/26/17     History of Present Illness:    Lisa [name] 84 y.o. female was  seen in the office for evaluation of a new right lung lesion and dilated ascending aorta.  The patient is well-known to me from long-term care of her husband.  She had noticed slow deterioration in her overall health recently.  She has lost appetite had some weight loss perhaps 10 pounds but she is not sure.  Recently she had increasing nonproductive cough and right chest pain she denies any fever or chills.  She has had no hemoptysis.  Because of a cough a recent chest x-ray was done demonstrating a right middle lobe lung lesion cavitary in nature.  CT scan of the chest was done yesterday and the patient referred today to surgical clinic.   Patient has a previous history approximately 30-pack-year smoking from 19 60-19 90, none since.  She has a previous history of melanoma resection from the right shoulder, wide resection was done without evidence of residual melanoma, exact stage is unknown.  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  Needle US guided biopsy of hyper metabolic right neck node done last week- no malignancy  identified    Family history is significant for her brother died at age 50 of lung cancer and a sister died of stomach cancer.  There is no family history of aortic aneurysms or aortic dissections.   Current Activity/ Functional Status:  Patient is independent with mobility/ambulation,  transfers, ADL's, IADL's.   Zubrod Score: At the time of surgery this patient's most appropriate activity status/level should be described as: []     0    Normal activity, no symptoms [x]     1    Restricted in physical strenuous activity but ambulatory, able to do out light work []     2    Ambulatory and capable of self care, unable to do work activities, up and about               >50 % of waking hours                              []     3    Only limited self care, in bed greater than 50% of waking hours []     4    Completely disabled, no self care, confined to bed or chair []     5    Moribund   Past Medical History:  Diagnosis Date  . Diverticulosis   . Headache   . Hemorrhoids    internal and external  . Hyperlipidemia 01/29/1991  . Hypertension 10/29/1995  . Hypothyroidism 10/29/1995  . Lung mass    right middle lobe  .  Melanoma ([place])    h/o, local excision. no chemo or rady.   . Skin cancer (melanoma) ([place])   . Tubular adenoma of colon   . Wears dentures     Past Surgical History:  Procedure Laterality Date  . cataract surgery  2007, 2008   repair bilat  . DG BARIUM ENEMA ([place] HX) N/A 2016   [name]  . HERNIA REPAIR  04/25/2001  . IR US GUIDE BX ASP/DRAIN  11/17/2017  . KNEE SURGERY     meniscus tear per Dr. Ronnie Derby, R knee  . MULTIPLE TOOTH EXTRACTIONS    . SEPTOPLASTY  2006   Dr. Ernesto Rutherford  . SKIN CANCER EXCISION    . TONSILLECTOMY  1954  . VAGINAL HYSTERECTOMY      Family History  Problem Relation Age of Onset  . Stroke Mother   . Heart disease Mother        CHF  . Stomach cancer Sister   . Cancer Brother        Metastatic cancer  . Colon cancer Neg Hx   . Breast cancer Neg Hx      Social History   Tobacco Use  Smoking Status Former Smoker  . Packs/day: 1.00  . Years: 30.00  . Pack years: 30.00  . Types: Cigarettes  . Start date: 10/28/1958  . Last attempt to quit: 10/27/1988  . Years since quitting: 29.0  Smokeless Tobacco Never Used    Tobacco Comment   quit 1994    Social History   Substance and Sexual Activity  Alcohol Use No     Allergies  Allergen Reactions  . Atorvastatin Other (See Comments)    CAUSE BURN AND TINGLING IN LEGS  . Celebrex [Celecoxib] Swelling and Rash    SWELLING REACTION UNSPECIFIED   . Cholecalciferol Anxiety and Other (See Comments)    nervous, crying with high dose replacement.    . Simvastatin Other (See Comments)    Myalgias    No current facility-administered medications for this encounter.     Pertinent items are noted in HPI.   Review of Systems:     Cardiac Review of Systems: [Y] = yes  or   [ N ] = no   Chest Pain [ n   ]  Resting SOB [ y  ] Exertional SOB  [name]  ]  Vertell Limber Florencio.Farrier  ]   Pedal Edema [name]   ]    Palpitations [ n ] Syncope  Florencio.Farrier  ]   Presyncope [ n  ]   General Review of Systems: [Y] = yes [  ]=no Constitional: recent weight change [  ];  Wt loss over the last 3 months [ y  ] anorexia [ y ]; fatigue [  ]; nausea [  ]; night sweats [  ]; fever [  ]; or chills [  ];           Eye : blurred vision [  ]; diplopia [   ]; vision changes [  ];  Amaurosis fugax[  ]; Resp: cough [name]  ];  wheezing[  ];  hemoptysis[  ]; shortness of breath[y  ]; paroxysmal nocturnal dyspnea[  ]; dyspnea on exertion[  ]; or orthopnea[  ];  GI:  gallstones[  ], vomiting[  ];  dysphagia[  ]; melena[  ];  hematochezia [  ]; heartburn[  ];   Hx of  Colonoscopy[  ]; GU: kidney stones [  ]; hematuria[  ];  dysuria [  ];  nocturia[  ];  history of     obstruction [  ]; urinary frequency [  ]             Skin: rash, swelling[  ];, hair loss[  ];  peripheral edema[  ];  or itching[  ]; Musculosketetal: myalgias[  ];  joint swelling[  ];  joint erythema[  ];  joint pain[  ];  back pain[  ];  Heme/Lymph: bruising[  ];  bleeding[  ];  anemia[  ];  Neuro: TIA[  ];  headaches[  ];  stroke[  ];  vertigo[  ];  seizures[  ];   paresthesias[  ];  difficulty walking[  ];  Psych:depression[  ]; anxiety[   ];  Endocrine: diabetes[  ];  thyroid dysfunction[  ];  Immunizations: Flu up to date [ y ]; Pneumococcal up to date [ y ];  Other:    PHYSICAL EXAMINATION: BP 137/64   Pulse 84   Temp 98.7 F (37.1 C) (Oral)   Resp 20   SpO2 99%  General appearance: alert, cooperative, appears stated age, no distress and pale Head: Normocephalic, without obvious abnormality, atraumatic Neck: no adenopathy, no carotid bruit, no JVD, supple, symmetrical, trachea midline and thyroid not enlarged, symmetric, no tenderness/mass/nodules Lymph nodes: Cervical, supraclavicular, and axillary nodes normal. Resp: diminished breath sounds RML Back: symmetric, no curvature. ROM normal. No CVA tenderness. Cardio: regular rate and rhythm, S1, S2 normal, no murmur, click, rub or gallop GI: soft, non-tender; bowel sounds normal; no masses,  no organomegaly Extremities: extremities normal, atraumatic, no cyanosis or edema and Homans sign is negative, no sign of DVT Neurologic: Grossly normal Mild pectus deformity  Diagnostic Studies & Laboratory data:     Recent Radiology Findings:   Dg Chest 2 View  Result Date: 10/26/2017 CLINICAL DATA:  Cough for 2-3 months. Diminished right lower lobe breath sounds. EXAM: CHEST - 2 VIEW COMPARISON:  07/23/2005 FINDINGS: The cardiac silhouette is normal in size. Thoracic aortic tortuosity and atherosclerotic calcification are noted. There is masslike opacity in the right middle lobe with focal convex margin along the minor fissure and with associated right middle lobe volume loss. Mild scarring or atelectasis is noted in the left lung base. There is a background of chronic mild lung hyperinflation. There is no pleural effusion or pneumothorax. An old posterior right fourth rib fracture is again noted. IMPRESSION: Right middle lobe volume loss with suspected underlying mass highly concerning for neoplasm. Further evaluation with chest CT is recommended. These results will be called  to the ordering clinician or representative by the Radiologist Assistant, and communication documented in the PACS or zVision Dashboard. Electronically Signed   By: Logan Bores M.D.   On: 10/26/2017 09:10   Ct Chest Wo Contrast  Result Date: 10/26/2017 CLINICAL DATA:  Evaluate right lung mass identified on chest radiograph. EXAM: CT CHEST WITHOUT CONTRAST TECHNIQUE: Multidetector CT imaging of the chest was performed following the standard protocol without IV contrast. COMPARISON:  None FINDINGS: Cardiovascular: The heart size appears normal. No pericardial effusion. Aortic atherosclerosis. Ascending aortic aneurysm measures 5.1 cm, image 74/2. Mediastinum/Nodes: Normal appearance of the thyroid gland. Normal appearance of the esophagus. The trachea appears patent and is midline. No enlarged mediastinal lymph nodes identified. Lungs/Pleura: Small right pleural effusion identified. There is a large central thick walled cavitary mass within the right middle lobe which measures 5.3 cm in maximum dimension. The mass obstructs the right middle lobe bronchus  resulting in postobstructive atelectasis. Evaluation of the right hilar region is diminished due to lack of IV contrast material. Upper Abdomen: No acute abnormality. There are multiple cysts identified within the kidneys which are incompletely characterized without IV contrast material. Musculoskeletal: No aggressive lytic or sclerotic bone lesions. Multi level thoracic spondylosis identified. An inferior endplate Schmorl's node deformity is identified within the lower thoracic spine. IMPRESSION: 1. Right middle lobe central thick walled cavitary lung mass is identified. In the absence of signs or symptoms of pneumonia findings are worrisome for a necrotic tumor. Associated postobstructive pneumonitis of the right middle lobe is noted. Recommend further evaluation with PET-CT and tissue sampling. 2. Ascending thoracic aortic aneurysm. Ascending thoracic aortic  aneurysm. Recommend semi-annual imaging followup by CTA or MRA and referral to cardiothoracic surgery if not already obtained. This recommendation follows 2010 ACCF/AHA/AATS/ACR/ASA/SCA/SCAI/SIR/STS/SVM Guidelines for the Diagnosis and Management of Patients With Thoracic Aortic Disease. Circulation. 2010; 121: G256-L893. Aortic aneurysm NOS (ICD10-I71.9). Aortic Atherosclerosis (ICD10-I70.0). Electronically Signed   By: Kerby Moors M.D.   On: 10/26/2017 14:29    Dg Chest 2 View  Result Date: 10/26/2017 CLINICAL DATA:  Cough for 2-3 months. Diminished right lower lobe breath sounds. EXAM: CHEST - 2 VIEW COMPARISON:  07/23/2005 FINDINGS: The cardiac silhouette is normal in size. Thoracic aortic tortuosity and atherosclerotic calcification are noted. There is masslike opacity in the right middle lobe with focal convex margin along the minor fissure and with associated right middle lobe volume loss. Mild scarring or atelectasis is noted in the left lung base. There is a background of chronic mild lung hyperinflation. There is no pleural effusion or pneumothorax. An old posterior right fourth rib fracture is again noted. IMPRESSION: Right middle lobe volume loss with suspected underlying mass highly concerning for neoplasm. Further evaluation with chest CT is recommended. These results will be called to the ordering clinician or representative by the Radiologist Assistant, and communication documented in the PACS or zVision Dashboard. Electronically Signed   By: Logan Bores M.D.   On: 10/26/2017 09:10   Ct Chest Wo Contrast  Result Date: 10/26/2017 CLINICAL DATA:  Evaluate right lung mass identified on chest radiograph. EXAM: CT CHEST WITHOUT CONTRAST TECHNIQUE: Multidetector CT imaging of the chest was performed following the standard protocol without IV contrast. COMPARISON:  None FINDINGS: Cardiovascular: The heart size appears normal. No pericardial effusion. Aortic atherosclerosis. Ascending aortic  aneurysm measures 5.1 cm, image 74/2. Mediastinum/Nodes: Normal appearance of the thyroid gland. Normal appearance of the esophagus. The trachea appears patent and is midline. No enlarged mediastinal lymph nodes identified. Lungs/Pleura: Small right pleural effusion identified. There is a large central thick walled cavitary mass within the right middle lobe which measures 5.3 cm in maximum dimension. The mass obstructs the right middle lobe bronchus resulting in postobstructive atelectasis. Evaluation of the right hilar region is diminished due to lack of IV contrast material. Upper Abdomen: No acute abnormality. There are multiple cysts identified within the kidneys which are incompletely characterized without IV contrast material. Musculoskeletal: No aggressive lytic or sclerotic bone lesions. Multi level thoracic spondylosis identified. An inferior endplate Schmorl's node deformity is identified within the lower thoracic spine. IMPRESSION: 1. Right middle lobe central thick walled cavitary lung mass is identified. In the absence of signs or symptoms of pneumonia findings are worrisome for a necrotic tumor. Associated postobstructive pneumonitis of the right middle lobe is noted. Recommend further evaluation with PET-CT and tissue sampling. 2. Ascending thoracic aortic aneurysm. Ascending thoracic aortic aneurysm. Recommend  semi-annual imaging followup by CTA or MRA and referral to cardiothoracic surgery if not already obtained. This recommendation follows 2010 ACCF/AHA/AATS/ACR/ASA/SCA/SCAI/SIR/STS/SVM Guidelines for the Diagnosis and Management of Patients With Thoracic Aortic Disease. Circulation. 2010; 121: X324-M010. Aortic aneurysm NOS (ICD10-I71.9). Aortic Atherosclerosis (ICD10-I70.0). Electronically Signed   By: Kerby Moors M.D.   On: 10/26/2017 14:29   Nm Pet Image Initial (pi) Skull Base To Thigh  Result Date: 11/08/2017 CLINICAL DATA:  Initial treatment strategy for cavitary right lung mass.  EXAM: NUCLEAR MEDICINE PET SKULL BASE TO THIGH TECHNIQUE: 9.18 mCi F-18 FDG was injected intravenously. Full-ring PET imaging was performed from the skull base to thigh after the radiotracer. CT data was obtained and used for attenuation correction and anatomic localization. Fasting blood glucose: 109 mg/dl COMPARISON:  Chest CT 10/26/2017 FINDINGS: Mediastinal blood pool activity: SUV max 2.93 NECK: No hypermetabolic lymph nodes in the neck. Incidental CT findings: none CHEST: 9 mm supraclavicular lymph node on the right side on image number 40 is hypermetabolic with SUV max of 2.72 and may be amenable to ultrasound-guided percutaneous biopsy. Large (6.5 x 5.0 cm) cavitary right middle lobe lung lesion is hypermetabolic with SUV max of 53.66. Adjacent right hilar lymphadenopathy with SUV max of 7.2. 8 mm subcarinal lymph node on image number 68 is hypermetabolic with SUV max of 4.40. No other pulmonary nodules or hypermetabolic mediastinal lymph nodes. Incidental CT findings: Postobstructive pneumonitis is noted in the right middle lobe. Stable ascending thoracic aortic aneurysm with dense calcification. Maximum measures 5.1 cm. ABDOMEN/PELVIS: Small focus of hypermetabolism near the inferior margin of the left adrenal gland but no obvious nodule. SUV max is 3.65 but I do not think this represents metastatic disease. No hepatic lesions and no abdominal/pelvic lymphadenopathy. In the right lower quadrant/pelvis there is a spigelian type hernia on the right side with a soft tissue nodule. I think it is possible this is some type of postoperative repair/plug with a small recurrent hernia. This is hypermetabolic with SUV max of 34.7. I doubt this is metastatic disease. It is much more likely some type of postinflammatory or post surgical process. Incidental CT findings: Advanced atherosclerotic calcifications involving the aorta iliac arteries. Multiple renal cysts are noted. SKELETON: Diffuse patchy marrow activity  possibly related to anemia. I do not see a discrete bone lesions to suggest metastatic disease. Incidental CT findings: none IMPRESSION: 1. Large cavitary right middle lobe lung mass is hypermetabolic and consistent with primary lung neoplasm. There is also adjacent right hilar adenopathy and subcarinal adenopathy. 2. 8 mm right supraclavicular lymph node is hypermetabolic and likely represents metastatic disease. This may be amenable to ultrasound-guided biopsy. 3. No definite findings for metastatic disease involving the liver or adrenal glands. 4. Elongated soft tissue nodule in a right lower quadrant hernia is indeterminate but most likely a benign inflammatory or postinflammatory/postsurgical process. Electronically Signed   By: Marijo Sanes M.D.   On: 11/08/2017 15:19   Ir US Guide Bx Asp/drain  Result Date: 11/17/2017 INDICATION: 84 year old female with a cavitary right upper lobe pulmonary mass and mediastinal adenopathy concerning for primary bronchogenic carcinoma. Additionally, she has a hypermetabolic right supraclavicular lymph node concerning for in 3 disease. She presents for ultrasound-guided biopsy of the same. EXAM: Ultrasound-guided biopsy right supraclavicular lymph node MEDICATIONS: None. ANESTHESIA/SEDATION: Moderate (conscious) sedation was employed during this procedure. A total of Versed 1 mg and Fentanyl 75 mcg was administered intravenously. Moderate Sedation Time: 15 minutes. The patient's level of consciousness and vital signs were  monitored continuously by radiology nursing throughout the procedure under my direct supervision. FLUOROSCOPY TIME:  Fluoroscopy Time: 0 minutes 0 seconds (0 mGy). COMPLICATIONS: None immediate. PROCEDURE: Informed written consent was obtained from the patient after a thorough discussion of the procedural risks, benefits and alternatives. All questions were addressed. A timeout was performed prior to the initiation of the procedure. The right  supraclavicular fossa was interrogated with ultrasound. A hypoechoic and enlarged supraclavicular lymph node is identified. Although enlarged, the node measures just over 1 cm in diameter and lies immediately adjacent to the vascular structures. A suitable skin entry site was selected and marked. The region was sterilely prepped and draped in standard fashion using chlorhexidine skin prep. Local anesthesia was attained by infiltration with 1% lidocaine. A small dermatotomy was made. Under real-time sonographic guidance, several 18 gauge core biopsies were obtained using the [employer] mission automated biopsy device. Biopsy specimens were placed in formalin and delivered to pathology for further analysis. Post biopsy ultrasound imaging demonstrates no immediate complication. The patient tolerated the procedure well. IMPRESSION: Ultrasound-guided core biopsy right supraclavicular lymph node. Electronically Signed   By: Jacqulynn Cadet M.D.   On: 11/17/2017 17:17     I have independently reviewed the above radiology studies  and reviewed the findings with the patient.   Recent Lab Findings: Lab Results  Component Value Date   WBC 6.7 11/17/2017   HGB 11.0 (L) 11/17/2017   HCT 35.8 (L) 11/17/2017   PLT 281 11/17/2017   GLUCOSE 98 10/25/2017   CHOL 234 (H) 06/15/2017   TRIG 143.0 06/15/2017   HDL 47.10 06/15/2017   LDLDIRECT 156.0 06/05/2015   LDLCALC 158 (H) 06/15/2017   ALT 42 (H) 10/25/2017   AST 45 (H) 10/25/2017   NA 137 10/25/2017   K 4.2 10/25/2017   CL 99 10/25/2017   CREATININE 1.67 (H) 10/25/2017   BUN 27 (H) 10/25/2017   CO2 29 10/25/2017   TSH 2.08 08/19/2017   INR 0.99 11/17/2017    Chronic Kidney Disease   Stage I     GFR >90  Stage II    GFR 60-89  Stage IIIA GFR 45-59  Stage IIIB GFR 30-44  Stage IV   GFR 15-29  Stage V    GFR  <15  Lab Results  Component Value Date   CREATININE 1.67 (H) 10/25/2017   CrCl cannot be calculated (Patient's most recent lab result is  older than the maximum 21 days allowed.).   Assessment / Plan:   Right middle lobe lung mass with central cavitary lesion -suggestive of squamous cell carcinoma of the lung Ascending aortic aneurysm approximately 5 cm in size not fully evaluated with the noncontrasted CT scan stage IV chronic kidney disease, creatinine 1.67 History of squamous cell carcinoma in situ of the skin History of melanoma resection-unknown stage  I seen and examined the patient, with her granddaughter present who helped with the symptoms she had been noting.  The lesion of the right middle lobe with central cavitation in question of hilar adenopathy is suggestive of squamous cell carcinoma of the lung-  I discussed with the patient the dilated ascending aorta echocardiogramdone .  At this point her dilated aorta can be followed.  She did note that she was not interested in having major surgery including replacement of her ascending aorta.  With negative result from node will proceed with Bronch ebus to obtain tissue dx. The goals risks and alternatives of the planned surgical procedure Procedure(s): VIDEO BRONCHOSCOPY  WITH ENDOBRONCHIAL ULTRASOUND (N/A) POSSIBLE VIDEO BRONCHOSCOPY WITH ENDOBRONCHIAL NAVIGATION (N/A)  have been discussed with the patient in detail. The risks of the procedure including death, infection, stroke, myocardial infarction, bleeding, blood transfusion have all been discussed specifically.  I have quoted [name] a 2 % of perioperative mortality and a complication rate as high as 30 %. The patient's questions have been answered.[name] is willing  to proceed with the planned procedure.    Grace Isaac MD      [address] ,[place] [address] Office [phone]   Beeper [phone]  11/23/2017 11:40 AM

## 2017-11-23 NOTE — Transfer of Care (Signed)
Immediate Anesthesia Transfer of Care Note  Patient: Lisa [name]  Procedure(s) Performed: VIDEO BRONCHOSCOPY WITH ENDOBRONCHIAL ULTRASOUND (N/A Thoracic) BRONCHIAL BIOPSIES  Patient Location: PACU  Anesthesia Type:General  Level of Consciousness: awake, alert  and patient cooperative  Airway & Oxygen Therapy: Patient Spontanous Breathing  Post-op Assessment: Report given to RN and Post -op Vital signs reviewed and stable  Post vital signs: Reviewed and stable  Last Vitals:  Vitals Value Taken Time  BP 96/54 11/23/2017  2:17 PM  Temp    Pulse 83 11/23/2017  2:18 PM  Resp 19 11/23/2017  2:18 PM  SpO2 98 % 11/23/2017  2:18 PM  Vitals shown include unvalidated device data.  Last Pain:  Vitals:   11/23/17 1149  TempSrc:   PainSc: 0-No pain         Complications: No apparent anesthesia complications

## 2017-11-23 NOTE — Anesthesia Preprocedure Evaluation (Signed)
Anesthesia Evaluation  Patient identified by MRN, date of birth, ID band Patient awake    Reviewed: Allergy & Precautions, NPO status , Patient's Chart, lab work & pertinent test results  History of Anesthesia Complications Negative for: history of anesthetic complications  Airway Mallampati: III  TM Distance: >3 FB Neck ROM: Full    Dental  (+) Teeth Intact   Pulmonary former smoker,    breath sounds clear to auscultation       Cardiovascular hypertension, Pt. on medications  Rhythm:Regular     Neuro/Psych  Headaches, negative psych ROS   GI/Hepatic negative GI ROS, Neg liver ROS,   Endo/Other  Hypothyroidism   Renal/GU negative Renal ROS     Musculoskeletal   Abdominal   Peds  Hematology  (+) anemia ,   Anesthesia Other Findings 8/19:  Nuclear stress EF: 54%.  There was no ST segment deviation noted during stress.  Defect 1: There is a small defect of severe severity present in the apical septal and apex location.  This is a low risk study.  The left ventricular ejection fraction is mildly decreased (45-54%).   Mildly abnormal, low risk stress nuclear study with prominent apical thinning versus prior infarct.  No ischemia.  Ejection fraction low normal at 54%.  Normal wall motion.  Reproductive/Obstetrics                             Anesthesia Physical Anesthesia Plan  ASA: II  Anesthesia Plan: General   Post-op Pain Management:    Induction: Intravenous  PONV Risk Score and Plan: 3 and Ondansetron and Dexamethasone  Airway Management Planned: Oral ETT  Additional Equipment: None  Intra-op Plan:   Post-operative Plan: Extubation in OR  Informed Consent: I have reviewed the patients History and Physical, chart, labs and discussed the procedure including the risks, benefits and alternatives for the proposed anesthesia with the patient or authorized representative who  has indicated his/her understanding and acceptance.   Dental advisory given  Plan Discussed with: CRNA and Surgeon  Anesthesia Plan Comments:         Anesthesia Quick Evaluation

## 2017-11-23 NOTE — Telephone Encounter (Signed)
Oncology Nurse Navigator Documentation  Oncology Nurse Navigator Flowsheets 11/23/2017  Navigator Location [facility]  Referral date to RadOnc/MedOnc 11/23/2017  Navigator Encounter Type Telephone/I received referral on Lisa [name] today. I called but was unable to reach her. I will cal her back.   Telephone Outgoing Call  Confirmed Diagnosis Date 11/23/2017  Barriers/Navigation Needs Coordination of Care  Interventions Coordination of Care  Coordination of Care Other  Acuity Level 2  Time Spent with Patient 15

## 2017-11-23 NOTE — Discharge Instructions (Signed)
Flexible Bronchoscopy, Care After This sheet gives you information about how to care for yourself after your procedure. Your health care provider may also give you more specific instructions. If you have problems or questions, contact your health care provider. What can I expect after the procedure? After the procedure, it is common to have the following symptoms for 24-48 hours:  A cough that is worse than it was before the procedure.  A low-grade fever.  A sore throat or hoarse voice.  Small streaks of blood in the mucus from your lungs (sputum), if tissue samples were removed (biopsy).  Follow these instructions at home: Eating and drinking  Do not eat or drink anything (including water) for 2 hours after your procedure, or until your numbing medicine (local anesthetic) has worn off. Having a numb throat increases your risk of burning yourself or choking.  After your numbness is gone and your cough and gag reflexes have returned, you may start eating only soft foods and slowly drinking liquids.  The day after the procedure, return to your normal diet. Driving  Do not drive for 24 hours if you were given a medicine to help you relax (sedative).  Do not drive or use heavy machinery while taking prescription pain medicine. General instructions  Take over-the-counter and prescription medicines only as told by your health care provider.  Return to your normal activities as told by your health care provider. Ask your health care provider what activities are safe for you.  Do not use any products that contain nicotine or tobacco, such as cigarettes and e-cigarettes. If you need help quitting, ask your health care provider.  Keep all follow-up visits as told by your health care provider. This is important, especially if you had a biopsy taken. Get help right away if:  You have shortness of breath that gets worse.  You become light-headed or feel like you might faint.  You have  chest pain.  You cough up more than a small amount of blood.  The amount of blood you cough up increases. Summary  Common symptoms in the 24-48 hours following a flexible bronchoscopy include cough, low-grade fever, sore throat or hoarse voice, and blood-streaked mucus from the lungs (if you had a biopsy).  Do not eat or drink anything (including water) for 2 hours after your procedure, or until your local anesthetic has worn off. You can return to your normal diet the day after the procedure.  Get help right away if you develop worsening shortness of breath, have chest pain, become light-headed, or cough up more than a small amount of blood. This information is not intended to replace advice given to you by your health care provider. Make sure you discuss any questions you have with your health care provider. Document Released: 10/03/2004 Document Revised: 04/03/2016 Document Reviewed: 04/03/2016 Elsevier Interactive Patient Education  2017 [facility] Care Instructions  Activity: Get plenty of rest for the remainder of the day. A responsible individual must stay with you for 24 hours following the procedure.  For the next 24 hours, DO NOT: -Drive a car -Paediatric nurse -Drink alcoholic beverages -Take any medication unless instructed by your physician -Make any legal decisions or sign important papers.  Meals: Start with liquid foods such as gelatin or soup. Progress to regular foods as tolerated. Avoid greasy, spicy, heavy foods. If nausea and/or vomiting occur, drink only clear liquids until the nausea and/or vomiting subsides. Call your physician if vomiting  continues.  Special Instructions/Symptoms: Your throat may feel dry or sore from the anesthesia or the breathing tube placed in your throat during surgery. If this causes discomfort, gargle with warm salt water. The discomfort should disappear within 24 hours.  If you had a scopolamine patch  placed behind your ear for the management of post- operative nausea and/or vomiting:  1. The medication in the patch is effective for 72 hours, after which it should be removed.  Wrap patch in a tissue and discard in the trash. Wash hands thoroughly with soap and water. 2. You may remove the patch earlier than 72 hours if you experience unpleasant side effects which may include dry mouth, dizziness or visual disturbances. 3. Avoid touching the patch. Wash your hands with soap and water after contact with the patch.

## 2017-11-23 NOTE — Anesthesia Procedure Notes (Signed)
Procedure Name: Intubation Date/Time: 11/23/2017 12:29 PM Performed by: White, Amedeo Plenty, CRNA Pre-anesthesia Checklist: Patient identified, Emergency Drugs available, Suction available and Patient being monitored Patient Re-evaluated:Patient Re-evaluated prior to induction Oxygen Delivery Method: Circle System Utilized Preoxygenation: Pre-oxygenation with 100% oxygen Induction Type: IV induction Ventilation: Mask ventilation without difficulty Laryngoscope Size: Mac and 3 Grade View: Grade I Tube type: Oral Tube size: 8.5 mm Number of attempts: 1 Airway Equipment and Method: Stylet Placement Confirmation: ETT inserted through vocal cords under direct vision,  positive ETCO2 and breath sounds checked- equal and bilateral Secured at: 22 cm Tube secured with: Tape Dental Injury: Teeth and Oropharynx as per pre-operative assessment

## 2017-11-23 NOTE — Brief Op Note (Signed)
      [address]       [place] [address]             [phone]      11/23/2017  2:23 PM  PATIENT:  Lisa [name]  84 y.o. female  PRE-OPERATIVE DIAGNOSIS:  RML LUNG MASS  POST-OPERATIVE DIAGNOSIS:  RML LUNG MASS  PROCEDURE:  Procedure(s): VIDEO BRONCHOSCOPY WITH ENDOBRONCHIAL ULTRASOUND (N/A) BRONCHIAL BIOPSIES  SURGEON:  Surgeon(s) and Role:    * Grace Isaac, MD - Primary  PHYSICIAN ASSISTANT:   ASSISTANTS: none   ANESTHESIA:   none  EBL: none   BLOOD ADMINISTERED:none  DRAINS: none   LOCAL MEDICATIONS USED:  NONE  SPECIMEN:  Source of Specimen:  right middle  lobe and 10 r node   DISPOSITION OF SPECIMEN:  PATHOLOGY  COUNTS:  YES  DICTATION: .Dragon Dictation  PLAN OF CARE: Discharge to home after PACU  PATIENT DISPOSITION:  PACU - hemodynamically stable.   Delay start of Pharmacological VTE agent (>24hrs) due to surgical blood loss or risk of bleeding: yes

## 2017-11-24 ENCOUNTER — Encounter (HOSPITAL_COMMUNITY): Payer: Self-pay | Admitting: Cardiothoracic Surgery

## 2017-11-24 ENCOUNTER — Encounter: Payer: Self-pay | Admitting: *Deleted

## 2017-11-24 DIAGNOSIS — R918 Other nonspecific abnormal finding of lung field: Secondary | ICD-10-CM

## 2017-11-24 LAB — ACID FAST SMEAR (AFB, MYCOBACTERIA): Acid Fast Smear: NEGATIVE

## 2017-11-24 NOTE — Progress Notes (Signed)
Oncology Nurse Navigator Documentation  Oncology Nurse Navigator Flowsheets 11/24/2017  Navigator Location [facility]  Navigator Encounter Type Telephone/I received a message from T surgery nurse coordinator about Mr. Sievers needing to be seen. I then received a call from patient's daughter who is on her release of information.  I updated her on next steps and appt.  She was thankful for the help.    Telephone Incoming Call  Treatment Phase Pre-Tx/Tx Discussion  Barriers/Navigation Needs Education;Coordination of Care  Education Other  Interventions Coordination of Care;Education  Coordination of Care Appts  Education Method Verbal  Acuity Level 3  Time Spent with Patient 30

## 2017-11-24 NOTE — Anesthesia Postprocedure Evaluation (Signed)
Anesthesia Post Note  Patient: Lisa [name]  Procedure(s) Performed: VIDEO BRONCHOSCOPY WITH ENDOBRONCHIAL ULTRASOUND (N/A Thoracic) BRONCHIAL BIOPSIES     Patient location during evaluation: PACU Anesthesia Type: General Level of consciousness: awake and alert Pain management: pain level controlled Vital Signs Assessment: post-procedure vital signs reviewed and stable Respiratory status: spontaneous breathing, nonlabored ventilation, respiratory function stable and patient connected to nasal cannula oxygen Cardiovascular status: blood pressure returned to baseline and stable Postop Assessment: no apparent nausea or vomiting Anesthetic complications: no    Last Vitals:  Vitals:   11/23/17 1445 11/23/17 1457  BP: (!) 98/58 (!) 96/58  Pulse: 87 87  Resp: (!) 24 19  Temp:    SpO2: 94% 95%    Last Pain:  Vitals:   11/23/17 1457  TempSrc:   PainSc: 0-No pain                 Safiyyah Vasconez

## 2017-11-25 LAB — CULTURE, BAL-QUANTITATIVE W GRAM STAIN: Culture: >=100000 — AB

## 2017-12-02 ENCOUNTER — Other Ambulatory Visit: Payer: Self-pay

## 2017-12-02 ENCOUNTER — Encounter: Payer: Self-pay | Admitting: Rehabilitation

## 2017-12-02 ENCOUNTER — Other Ambulatory Visit: Payer: Self-pay | Admitting: *Deleted

## 2017-12-02 ENCOUNTER — Ambulatory Visit
Admission: RE | Admit: 2017-12-02 | Discharge: 2017-12-02 | Disposition: A | Discharge status: Home / Self Care | Payer: Medicare Other | Source: Ambulatory Visit | Attending: Radiation Oncology | Admitting: Radiation Oncology

## 2017-12-02 ENCOUNTER — Encounter: Payer: Self-pay | Admitting: Internal Medicine

## 2017-12-02 ENCOUNTER — Encounter: Payer: Self-pay | Admitting: *Deleted

## 2017-12-02 ENCOUNTER — Ambulatory Visit: Payer: Medicare Other | Attending: Internal Medicine | Admitting: Rehabilitation

## 2017-12-02 ENCOUNTER — Inpatient Hospital Stay: Payer: Medicare Other | Attending: Internal Medicine | Admitting: Internal Medicine

## 2017-12-02 ENCOUNTER — Inpatient Hospital Stay: Payer: Medicare Other

## 2017-12-02 VITALS — BP 140/74 | HR 90 | Temp 98.0°F | Resp 18 | Ht 65.0 in | Wt 179.5 lb

## 2017-12-02 DIAGNOSIS — I10 Essential (primary) hypertension: Secondary | ICD-10-CM | POA: Diagnosis not present

## 2017-12-02 DIAGNOSIS — Z87891 Personal history of nicotine dependence: Secondary | ICD-10-CM | POA: Insufficient documentation

## 2017-12-02 DIAGNOSIS — R2689 Other abnormalities of gait and mobility: Secondary | ICD-10-CM | POA: Insufficient documentation

## 2017-12-02 DIAGNOSIS — Z79899 Other long term (current) drug therapy: Secondary | ICD-10-CM | POA: Diagnosis not present

## 2017-12-02 DIAGNOSIS — C3491 Malignant neoplasm of unspecified part of right bronchus or lung: Secondary | ICD-10-CM | POA: Insufficient documentation

## 2017-12-02 DIAGNOSIS — R5383 Other fatigue: Secondary | ICD-10-CM

## 2017-12-02 DIAGNOSIS — R531 Weakness: Secondary | ICD-10-CM | POA: Diagnosis not present

## 2017-12-02 DIAGNOSIS — R42 Dizziness and giddiness: Secondary | ICD-10-CM

## 2017-12-02 DIAGNOSIS — R590 Localized enlarged lymph nodes: Secondary | ICD-10-CM | POA: Insufficient documentation

## 2017-12-02 DIAGNOSIS — E039 Hypothyroidism, unspecified: Secondary | ICD-10-CM | POA: Insufficient documentation

## 2017-12-02 DIAGNOSIS — C342 Malignant neoplasm of middle lobe, bronchus or lung: Secondary | ICD-10-CM

## 2017-12-02 DIAGNOSIS — Z800 Family history of malignant neoplasm of digestive organs: Secondary | ICD-10-CM | POA: Diagnosis not present

## 2017-12-02 DIAGNOSIS — C3490 Malignant neoplasm of unspecified part of unspecified bronchus or lung: Secondary | ICD-10-CM

## 2017-12-02 DIAGNOSIS — Z85820 Personal history of malignant melanoma of skin: Secondary | ICD-10-CM | POA: Diagnosis not present

## 2017-12-02 DIAGNOSIS — Z5111 Encounter for antineoplastic chemotherapy: Secondary | ICD-10-CM | POA: Insufficient documentation

## 2017-12-02 DIAGNOSIS — Z23 Encounter for immunization: Secondary | ICD-10-CM | POA: Diagnosis not present

## 2017-12-02 DIAGNOSIS — E785 Hyperlipidemia, unspecified: Secondary | ICD-10-CM | POA: Insufficient documentation

## 2017-12-02 DIAGNOSIS — C3411 Malignant neoplasm of upper lobe, right bronchus or lung: Secondary | ICD-10-CM | POA: Diagnosis not present

## 2017-12-02 DIAGNOSIS — I700 Atherosclerosis of aorta: Secondary | ICD-10-CM

## 2017-12-02 DIAGNOSIS — R918 Other nonspecific abnormal finding of lung field: Secondary | ICD-10-CM

## 2017-12-02 DIAGNOSIS — Z7189 Other specified counseling: Secondary | ICD-10-CM | POA: Insufficient documentation

## 2017-12-02 LAB — CBC WITH DIFFERENTIAL (CANCER CENTER ONLY)
Basophils Absolute: 0 10*3/uL (ref 0.0–0.1)
Basophils Relative: 1 %
Eosinophils Absolute: 0.1 10*3/uL (ref 0.0–0.5)
Eosinophils Relative: 1 %
HCT: 34.4 % — ABNORMAL LOW (ref 34.8–46.6)
Hemoglobin: 10.8 g/dL — ABNORMAL LOW (ref 11.6–15.9)
Lymphocytes Relative: 20 %
Lymphs Abs: 1.6 10*3/uL (ref 0.9–3.3)
MCH: 26.7 pg (ref 25.1–34.0)
MCHC: 31.4 g/dL — ABNORMAL LOW (ref 31.5–36.0)
MCV: 85.1 fL (ref 79.5–101.0)
Monocytes Absolute: 0.4 10*3/uL (ref 0.1–0.9)
Monocytes Relative: 5 %
Neutro Abs: 5.9 10*3/uL (ref 1.5–6.5)
Neutrophils Relative %: 73 %
Platelet Count: 232 10*3/uL (ref 145–400)
RBC: 4.04 MIL/uL (ref 3.70–5.45)
RDW: 15.1 % — ABNORMAL HIGH (ref 11.2–14.5)
WBC Count: 8 10*3/uL (ref 3.9–10.3)

## 2017-12-02 LAB — CMP (CANCER CENTER ONLY)
ALT: 7 U/L (ref 0–44)
AST: 15 U/L (ref 15–41)
Albumin: 3.4 g/dL — ABNORMAL LOW (ref 3.5–5.0)
Alkaline Phosphatase: 76 U/L (ref 38–126)
Anion gap: 10 (ref 5–15)
BUN: 18 mg/dL (ref 8–23)
CO2: 27 mmol/L (ref 22–32)
Calcium: 10.3 mg/dL (ref 8.9–10.3)
Chloride: 103 mmol/L (ref 98–111)
Creatinine: 1.63 mg/dL — ABNORMAL HIGH (ref 0.44–1.00)
GFR, Est AFR Am: 33 mL/min — ABNORMAL LOW (ref >60)
GFR, Estimated: 28 mL/min — ABNORMAL LOW (ref >60)
Glucose, Bld: 104 mg/dL — ABNORMAL HIGH (ref 70–99)
Potassium: 3.4 mmol/L — ABNORMAL LOW (ref 3.5–5.1)
Sodium: 140 mmol/L (ref 135–145)
Total Bilirubin: 0.6 mg/dL (ref 0.3–1.2)
Total Protein: 7.4 g/dL (ref 6.5–8.1)

## 2017-12-02 MED ORDER — PROCHLORPERAZINE MALEATE 10 MG PO TABS: 10.0000 mg | ORAL_TABLET | Freq: Four times a day (QID) | ORAL | 0 refills | Status: DC | PRN

## 2017-12-02 NOTE — Patient Instructions (Signed)
Given postural and breathing exercise handout, walking program handout and encouragement, activity modification handout

## 2017-12-02 NOTE — Progress Notes (Signed)
[place] [phone]   [phone] Multidisciplinary thoracic oncology clinic CONSULT NOTE  REFERRING PHYSICIAN: Dr. Lanelle Bal  REASON FOR CONSULTATION:  84 years old white female recently diagnosed with lung cancer.  HPI JAMAIYAH PYLE is a 84 y.o. female with past medical history significant for hypertension, dyslipidemia, hypothyroidism, history of early stage melanoma status post resection, hemorrhoids, diverticulosis as well as history for smoking but quit in 1991.  The patient was complaining of loss of appetite as well as nonproductive cough for several months.  She was seen by her primary care physician and chest x-ray was performed on October 25, 2017.  It showed right middle lobe volume loss with suspected underlying mass highly concerning for neoplasm.  This was followed by CT scan of the chest on October 26, 2017 and that showed a large central thick-walled cavitary mass within the right middle lobe measured 5.3 cm.  The mass obstructs the right middle lobe bronchus resulting in postobstructive atelectasis.  The patient was referred to Dr. Servando Snare and a PET scan was performed on November 08, 2017 and that showed large 6.5 x 5.0 cm cavitary right middle lobe lesion hypermetabolic with SUV max of 86.57.  There was adjacent right hilar lymphadenopathy with SUV max of 7.2.  There was also 0.8 cm subcarinal node that is hypermetabolic with SUV max of 8.46.  The scan also showed 0.9 cm supraclavicular lymph node on the right side with SUV max of 3.87.  On 11/17/2017 the patient underwent ultrasound-guided biopsy of the right supraclavicular lymph node by interventional radiology but the pathology was not conclusive for malignancy.  On 11/23/2017 the patient underwent video bronchoscopy with endobronchial ultrasound and biopsies under the care of Dr. Servando Snare.  The final pathology ([id number]) was consistent with squamous cell carcinoma. Dr. Servando Snare kindly  referred the patient to the multidisciplinary thoracic oncology clinic today for evaluation and recommendation regarding treatment of her condition.  When seen today the patient continues to complain of fatigue and generalized weakness as well as occasional dizzy spells.  She also continues to have dry cough but no significant chest pain, shortness breath or hemoptysis.  There is no nausea, vomiting, diarrhea or constipation.  She denied having any headache or visual changes.  She lost around 15 pounds in the last few months. Family history significant for mother died from heart disease at age 70, sister had stomach cancer, brother has lung cancer and father had a stroke. The patient is married and has 2 children son and daughter.  She used to work for [employer].  She has a history of smoking for around 30 years but quit in 1991.  She has no history of alcohol or drug abuse.  HPI  Past Medical History:  Diagnosis Date  . Diverticulosis   . Headache   . Hemorrhoids    internal and external  . Hyperlipidemia 01/29/1991  . Hypertension 10/29/1995  . Hypothyroidism 10/29/1995  . Lung mass    right middle lobe  . Melanoma ([place])    h/o, local excision. no chemo or rady.   . Skin cancer (melanoma) ([place])   . Tubular adenoma of colon   . Wears dentures     Past Surgical History:  Procedure Laterality Date  . BRONCHIAL BIOPSY  11/23/2017   Procedure: BRONCHIAL BIOPSIES;  Surgeon: Grace Isaac, MD;  Location: [facility] OR;  Service: Thoracic;;  . cataract surgery  2007, 2008   repair bilat  .  DG BARIUM ENEMA ([place] [facility]) N/A 2016   [name]  . HERNIA REPAIR  04/25/2001  . IR US GUIDE BX ASP/DRAIN  11/17/2017  . KNEE SURGERY     meniscus tear per Dr. Ronnie Derby, R knee  . MULTIPLE TOOTH EXTRACTIONS    . SEPTOPLASTY  2006   Dr. Ernesto Rutherford  . SKIN CANCER EXCISION    . TONSILLECTOMY  1954  . VAGINAL HYSTERECTOMY    . VIDEO BRONCHOSCOPY WITH ENDOBRONCHIAL ULTRASOUND N/A 11/23/2017    Procedure: VIDEO BRONCHOSCOPY WITH ENDOBRONCHIAL ULTRASOUND;  Surgeon: Grace Isaac, MD;  Location[facility] OR;  Service: Thoracic;  Laterality: N/A;    Family History  Problem Relation Age of Onset  . Stroke Mother   . Heart disease Mother        CHF  . Stomach cancer Sister   . Cancer Brother        Metastatic cancer  . Colon cancer Neg Hx   . Breast cancer Neg Hx     Social History Social History   Tobacco Use  . Smoking status: Former Smoker    Packs/day: 1.00    Years: 30.00    Pack years: 30.00    Types: Cigarettes    Start date: 10/28/1958    Last attempt to quit: 10/27/1988    Years since quitting: 29.1  . Smokeless tobacco: Never Used  . Tobacco comment: quit 1994  Substance Use Topics  . Alcohol use: No  . Drug use: No    Allergies  Allergen Reactions  . Atorvastatin Other (See Comments)    CAUSE BURN AND TINGLING IN LEGS  . Celebrex [Celecoxib] Swelling and Rash    SWELLING REACTION UNSPECIFIED   . Cholecalciferol Anxiety and Other (See Comments)    nervous, crying with high dose replacement.    . Simvastatin Other (See Comments)    Myalgias    Current Outpatient Medications  Medication Sig Dispense Refill  . amLODipine (NORVASC) 10 MG tablet Take 1 tablet (10 mg total) by mouth daily. 90 tablet 3  . Cholecalciferol (VITAMIN D) 2000 units tablet Take 2,000 Units by mouth 2 (two) times daily.    . Coenzyme Q10 100 MG capsule Take 100 mg by mouth daily.     Marland Kitchen docusate sodium (COLACE) 100 MG capsule Take 100 mg by mouth daily as needed for mild constipation.    . fluticasone (FLONASE) 50 MCG/ACT nasal spray USE 2 SPRAYS IN EACH NOSTRIL DAILY AS NEEDED FOR RHINITIS 48 g 3  . levothyroxine (SYNTHROID, LEVOTHROID) 75 MCG tablet Take 1 tablet (75 mcg total) by mouth daily. 90 tablet 3  . losartan-hydrochlorothiazide (HYZAAR) 100-25 MG tablet Take 1 tablet by mouth daily. 90 tablet 3  . sennosides-docusate sodium (SENOKOT-S) 8.6-50 MG tablet Take 1 tablet by  mouth daily as needed for constipation.     No current facility-administered medications for this visit.     Review of Systems  Constitutional: positive for fatigue and weight loss Eyes: negative Ears, nose, mouth, throat, and face: negative Respiratory: positive for cough Cardiovascular: negative Gastrointestinal: negative Genitourinary:negative Integument/breast: negative Hematologic/lymphatic: negative Musculoskeletal:negative Neurological: negative Behavioral/Psych: negative Endocrine: negative Allergic/Immunologic: negative  Physical Exam  IRJ:JOACZ, healthy, no distress, well nourished and well developed SKIN: skin color, texture, turgor are normal, no rashes or significant lesions HEAD: Normocephalic, No masses, lesions, tenderness or abnormalities EYES: normal, PERRLA EARS: External ears normal, Canals clear OROPHARYNX:no exudate, no erythema and lips, buccal mucosa, and tongue normal  NECK: supple, no adenopathy, no  JVD LYMPH:  no palpable lymphadenopathy, no hepatosplenomegaly BREAST:not examined LUNGS: clear to auscultation , and palpation HEART: regular rate & rhythm, no murmurs and no gallops ABDOMEN:abdomen soft, non-tender, normal bowel sounds and no masses or organomegaly BACK: Back symmetric, no curvature., No CVA tenderness EXTREMITIES:no joint deformities, effusion, or inflammation  NEURO: alert & oriented x 3 with fluent speech, no focal motor/sensory deficits  PERFORMANCE STATUS: ECOG 1  LABORATORY DATA: Lab Results  Component Value Date   WBC 8.0 12/02/2017   HGB 10.8 (L) 12/02/2017   HCT 34.4 (L) 12/02/2017   MCV 85.1 12/02/2017   PLT 232 12/02/2017      Chemistry      Component Value Date/Time   NA 138 11/23/2017 1201   K 3.4 (L) 11/23/2017 1201   CL 100 11/23/2017 1201   CO2 24 11/23/2017 1201   BUN 15 11/23/2017 1201   CREATININE 1.54 (H) 11/23/2017 1201      Component Value Date/Time   CALCIUM 10.2 11/23/2017 1201   ALKPHOS 65  11/23/2017 1201   AST 19 11/23/2017 1201   ALT 11 11/23/2017 1201   BILITOT 0.7 11/23/2017 1201       RADIOGRAPHIC STUDIES: Dg Chest 2 View  Result Date: 11/23/2017 CLINICAL DATA:  Preop EXAM: CHEST - 2 VIEW COMPARISON:  , CT chest of 10/26/2017 and PET-CT of 11/08/2017 FINDINGS: There is a moderately large rounded mass within the right middle lobe proximally consistent with primary lung carcinoma when compared with prior CT and PET-CT studies. However the remainder of the lungs are clear with only mild linear atelectasis or scarring at the left lung base. Mediastinal hilar contours are unremarkable and the heart is within upper limits of normal. Moderate thoracic aortic atherosclerosis is present. No acute bony abnormality is seen. IMPRESSION: Large right middle lobe mass consistent with primary lung carcinoma as noted above. No other abnormality is seen. Electronically Signed   By: Ivar Drape M.D.   On: 11/23/2017 12:19   Nm Pet Image Initial (pi) Skull Base To Thigh  Result Date: 11/08/2017 CLINICAL DATA:  Initial treatment strategy for cavitary right lung mass. EXAM: NUCLEAR MEDICINE PET SKULL BASE TO THIGH TECHNIQUE: 9.18 mCi F-18 FDG was injected intravenously. Full-ring PET imaging was performed from the skull base to thigh after the radiotracer. CT data was obtained and used for attenuation correction and anatomic localization. Fasting blood glucose: 109 mg/dl COMPARISON:  Chest CT 10/26/2017 FINDINGS: Mediastinal blood pool activity: SUV max 2.93 NECK: No hypermetabolic lymph nodes in the neck. Incidental CT findings: none CHEST: 9 mm supraclavicular lymph node on the right side on image number 40 is hypermetabolic with SUV max of 0.09 and may be amenable to ultrasound-guided percutaneous biopsy. Large (6.5 x 5.0 cm) cavitary right middle lobe lung lesion is hypermetabolic with SUV max of 38.18. Adjacent right hilar lymphadenopathy with SUV max of 7.2. 8 mm subcarinal lymph node on image  number 68 is hypermetabolic with SUV max of 2.99. No other pulmonary nodules or hypermetabolic mediastinal lymph nodes. Incidental CT findings: Postobstructive pneumonitis is noted in the right middle lobe. Stable ascending thoracic aortic aneurysm with dense calcification. Maximum measures 5.1 cm. ABDOMEN/PELVIS: Small focus of hypermetabolism near the inferior margin of the left adrenal gland but no obvious nodule. SUV max is 3.65 but I do not think this represents metastatic disease. No hepatic lesions and no abdominal/pelvic lymphadenopathy. In the right lower quadrant/pelvis there is a spigelian type hernia on the right side with a soft  tissue nodule. I think it is possible this is some type of postoperative repair/plug with a small recurrent hernia. This is hypermetabolic with SUV max of 61.9. I doubt this is metastatic disease. It is much more likely some type of postinflammatory or post surgical process. Incidental CT findings: Advanced atherosclerotic calcifications involving the aorta iliac arteries. Multiple renal cysts are noted. SKELETON: Diffuse patchy marrow activity possibly related to anemia. I do not see a discrete bone lesions to suggest metastatic disease. Incidental CT findings: none IMPRESSION: 1. Large cavitary right middle lobe lung mass is hypermetabolic and consistent with primary lung neoplasm. There is also adjacent right hilar adenopathy and subcarinal adenopathy. 2. 8 mm right supraclavicular lymph node is hypermetabolic and likely represents metastatic disease. This may be amenable to ultrasound-guided biopsy. 3. No definite findings for metastatic disease involving the liver or adrenal glands. 4. Elongated soft tissue nodule in a right lower quadrant hernia is indeterminate but most likely a benign inflammatory or postinflammatory/postsurgical process. Electronically Signed   By: Marijo Sanes M.D.   On: 11/08/2017 15:19   Ir US Guide Bx Asp/drain  Result Date:  11/17/2017 INDICATION: 84 year old female with a cavitary right upper lobe pulmonary mass and mediastinal adenopathy concerning for primary bronchogenic carcinoma. Additionally, she has a hypermetabolic right supraclavicular lymph node concerning for in 3 disease. She presents for ultrasound-guided biopsy of the same. EXAM: Ultrasound-guided biopsy right supraclavicular lymph node MEDICATIONS: None. ANESTHESIA/SEDATION: Moderate (conscious) sedation was employed during this procedure. A total of Versed 1 mg and Fentanyl 75 mcg was administered intravenously. Moderate Sedation Time: 15 minutes. The patient's level of consciousness and vital signs were monitored continuously by radiology nursing throughout the procedure under my direct supervision. FLUOROSCOPY TIME:  Fluoroscopy Time: 0 minutes 0 seconds (0 mGy). COMPLICATIONS: None immediate. PROCEDURE: Informed written consent was obtained from the patient after a thorough discussion of the procedural risks, benefits and alternatives. All questions were addressed. A timeout was performed prior to the initiation of the procedure. The right supraclavicular fossa was interrogated with ultrasound. A hypoechoic and enlarged supraclavicular lymph node is identified. Although enlarged, the node measures just over 1 cm in diameter and lies immediately adjacent to the vascular structures. A suitable skin entry site was selected and marked. The region was sterilely prepped and draped in standard fashion using chlorhexidine skin prep. Local anesthesia was attained by infiltration with 1% lidocaine. A small dermatotomy was made. Under real-time sonographic guidance, several 18 gauge core biopsies were obtained using the [employer] mission automated biopsy device. Biopsy specimens were placed in formalin and delivered to pathology for further analysis. Post biopsy ultrasound imaging demonstrates no immediate complication. The patient tolerated the procedure well. IMPRESSION:  Ultrasound-guided core biopsy right supraclavicular lymph node. Electronically Signed   By: Jacqulynn Cadet M.D.   On: 11/17/2017 17:17    ASSESSMENT: This is a very pleasant 84 years old white female recently diagnosed with a stage IIIb (T3, N3, M0) non-small cell lung cancer, squamous cell carcinoma presented with large right middle lobe lung mass in addition to right hilar, subcarinal and right supraclavicular lymphadenopathy diagnosed in August 2019.   PLAN: I had a lengthy discussion with the patient today about her current disease of stage, prognosis and treatment options. I recommended for the patient to complete the staging work-up by ordering MRI of the brain to rule out brain metastasis. I would also send the tissue block for PDL 1 expression. I discussed with the patient her treatment options and I  recommended for her a course of concurrent chemoradiation with weekly carboplatin for AUC of 2 and paclitaxel 45 NG/M2.  This will be followed by consolidation immunotherapy if the patient has no evidence for disease progression. I discussed with the patient the adverse effect of the chemotherapy including but not limited to alopecia, myelosuppression, nausea and vomiting, peripheral neuropathy, liver or renal dysfunction. She is expected to start the first cycle of this treatment on December 13, 2017. The patient will be seen by Dr. Sondra Come from radiation oncology later today for reevaluation and discussion of the radiotherapy option. She was seen during the multidisciplinary thoracic oncology clinic today by medical oncology, radiation oncology, physical therapist, thoracic navigator and social worker. She will have a chemotherapy education class before the first dose of her treatment. I will call her pharmacy with prescription for Compazine 10 mg p.o. every 6 hours as needed for nausea. She will come back for follow-up visit in 2 weeks for evaluation and management of any adverse effect of  her treatment. The patient was advised to call immediately if she has any concerning symptoms in the interval. The patient voices understanding of current disease status and treatment options and is in agreement with the current care plan.  All questions were answered. The patient knows to call the clinic with any problems, questions or concerns. We can certainly see the patient much sooner if necessary.  Thank you so much for allowing me to participate in the care of [name]. I will continue to follow up the patient with you and assist in her care.  I spent 55 minutes counseling the patient face to face. The total time spent in the appointment was 80 minutes.  Disclaimer: This note was dictated with voice recognition software. Similar sounding words can inadvertently be transcribed and may not be corrected upon review.   Eilleen Kempf December 02, 2017, 3:41 PM

## 2017-12-02 NOTE — Progress Notes (Signed)
START ON PATHWAY REGIMEN - Non-Small Cell Lung     Administer weekly:     Paclitaxel      Carboplatin   **Always confirm dose/schedule in your pharmacy ordering system**  Patient Characteristics: Stage III - Unresectable, PS = 0, 1 AJCC T Category: T3 Current Disease Status: No Distant Mets or Local Recurrence AJCC N Category: N3 AJCC M Category: M0 AJCC 8 Stage Grouping: IIIC Performance Status: PS = 0, 1 Intent of Therapy: Curative Intent, Discussed with Patient

## 2017-12-02 NOTE — Progress Notes (Signed)
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 female who is accompanied by two long-time friends. She presented to her PCP on 10/25/17 with peristent cough with yellow mucus and left ear drainage. A chest x-ray was performed and was abnormal. A chest CT scan followed and revealed: right middle lobe central thick walled cavitary lung mass is identified. In the absence of signs or symptoms of pneumonia findings are worrisome for a necrotic tumor. Associated postobstructive pneumonitis of the right middle lobe is noted. She was then referred to Dr. Servando Snare on 10/27/17. He ordered a PET scan, performed on 11/12/17, which revealed: 1. Large cavitary right middle lobe lung mass is hypermetabolic and consistent with primary lung neoplasm. There is also adjacent right hilar adenopathy and subcarinal adenopathy. 2. 8 mm right supraclavicular lymph node is hypermetabolic and likely represents metastatic disease. Bronchoscopy and biopsy were performed on 11/23/17, with pathology report showing squamous cell carcinoma.  She denies chest pain, headaches, nausea, and hemoptysis. She reports "coughing spells" at night and ankle swelling. She notes improved appetite.  The patient was referred today for presentation in the multidisciplinary conference.  Radiology studies and pathology slides were presented there for review and discussion of treatment options.  A consensus  was discussed regarding potential next steps.  PREVIOUS RADIATION THERAPY: No  PAST MEDICAL HISTORY:  has a past medical history of Diverticulosis, Headache, Hemorrhoids, Hyperlipidemia (01/29/1991), Hypertension (10/29/1995), Hypothyroidism (10/29/1995), Lung mass, Melanoma ([place]), Skin cancer (melanoma) ([place]), Tubular adenoma of colon, and Wears dentures.    PAST SURGICAL HISTORY: Past Surgical History:  Procedure Laterality Date  . BRONCHIAL BIOPSY  11/23/2017   Procedure: BRONCHIAL BIOPSIES;  Surgeon: Grace Isaac, MD;  Location: [facility] OR;  Service: Thoracic;;  . cataract surgery  2007, 2008   repair bilat  . DG BARIUM ENEMA ([place] HX) N/A 2016   [name]  . HERNIA REPAIR  04/25/2001  . IR US GUIDE BX ASP/DRAIN  11/17/2017  . KNEE SURGERY     meniscus tear per Dr. Ronnie Derby, R knee  . MULTIPLE TOOTH EXTRACTIONS    . SEPTOPLASTY  2006   Dr. Ernesto Rutherford  . SKIN CANCER EXCISION    . TONSILLECTOMY  1954  . VAGINAL HYSTERECTOMY    . VIDEO BRONCHOSCOPY WITH ENDOBRONCHIAL ULTRASOUND N/A 11/23/2017   Procedure: VIDEO BRONCHOSCOPY WITH ENDOBRONCHIAL ULTRASOUND;  Surgeon: Grace Isaac, MD;  Location: [facility] OR;  Service: Thoracic;  Laterality: N/A;    FAMILY HISTORY: family history includes Cancer in her brother; Heart disease in her mother; Stomach cancer in her sister; Stroke in her mother.  SOCIAL HISTORY:  reports that she quit smoking about 29 years ago. Her smoking use included cigarettes. She started smoking about 59 years ago. She has a 30.00 pack-year smoking history. She has never used smokeless tobacco. She reports that she does not drink alcohol or use drugs.  ALLERGIES: Atorvastatin; Celebrex [celecoxib]; Cholecalciferol; and Simvastatin  MEDICATIONS:  Current Outpatient Medications  Medication Sig Dispense Refill  . amLODipine (NORVASC) 10 MG tablet Take 1 tablet (10 mg total) by mouth daily. 90 tablet 3  . Cholecalciferol (VITAMIN D) 2000 units tablet Take 2,000 Units by  mouth 2 (two) times daily.    . Coenzyme Q10 100 MG capsule Take 100 mg by mouth daily.     Marland Kitchen docusate sodium (COLACE) 100 MG capsule Take 100 mg by mouth daily as needed for mild constipation.    . fluticasone (FLONASE) 50 MCG/ACT nasal spray USE 2 SPRAYS IN EACH NOSTRIL DAILY AS NEEDED FOR RHINITIS 48 g 3  . levothyroxine (SYNTHROID, LEVOTHROID) 75 MCG tablet Take 1 tablet (75 mcg total) by mouth daily. 90 tablet 3  . losartan-hydrochlorothiazide (HYZAAR) 100-25 MG tablet Take 1 tablet by mouth daily. 90 tablet 3  . Melatonin 3 MG TABS Take 1 tablet by mouth at bedtime as needed.    . prochlorperazine (COMPAZINE) 10 MG tablet Take 1 tablet (10 mg total) by mouth every 6 (six) hours as needed for nausea or vomiting. 30 tablet 0  . sennosides-docusate sodium (SENOKOT-S) 8.6-50 MG tablet Take 1 tablet by mouth daily as needed for constipation.     No current facility-administered medications for this encounter.     REVIEW OF SYSTEMS:  A 10+ POINT REVIEW OF SYSTEMS WAS OBTAINED including neurology, dermatology, psychiatry, cardiac, respiratory, lymph, extremities, GI, GU, musculoskeletal, constitutional, reproductive, HEENT. All pertinent positives are noted in the HPI. All others are negative.   PHYSICAL EXAM:  Vitals with BMI 12/02/2017  Height 5\' 5"   Weight 179 lbs 8 oz  BMI 25.85  Systolic 277  Diastolic 74  Pulse 90  Respirations 18  General: Alert and oriented, in no acute distress HEENT: Head is normocephalic. Extraocular movements are intact. Oropharynx is clear. Neck: Neck is supple, no palpable cervical or supraclavicular lymphadenopathy. Heart: Regular in rate and rhythm with no murmurs, rubs, or gallops. Chest: Clear to auscultation bilaterally, with no rhonchi, wheezes, or rales. Abdomen: Soft, nontender, nondistended, with no rigidity or guarding. Extremities: No cyanosis or edema. Lymphatics: see Neck Exam Skin: No concerning lesions. Musculoskeletal: symmetric strength  and muscle tone throughout. Neurologic: Cranial nerves II through XII are grossly intact. No obvious focalities. Speech is fluent. Coordination is intact. Psychiatric: Judgment and insight are intact. Affect is appropriate.  KPS = 90  100 - Normal; no complaints; no evidence of disease. 90   - Able to carry on normal activity; minor signs or symptoms of disease. 80   - Normal activity with effort; some signs or symptoms of disease. 89   - Cares for self; unable to carry on normal activity or to do active work. 60   - Requires occasional assistance, but is able to care for most of his personal needs. 50   - Requires considerable assistance and frequent medical care. 27   - Disabled; requires special care and assistance. 80   - Severely disabled; hospital admission is indicated although death not imminent. 66   - Very sick; hospital admission necessary; active supportive treatment necessary. 10   - Moribund; fatal processes progressing rapidly. 0     - Dead  Karnofsky DA, Abelmann WH, Craver LS and Burchenal [facility] [phone]) The use of the nitrogen mustards in the palliative treatment of carcinoma: with particular reference to bronchogenic carcinoma Cancer 1 634-56  LABORATORY DATA:  Lab Results  Component Value Date   WBC 8.0 12/02/2017   HGB 10.8 (L)  12/02/2017   HCT 34.4 (L) 12/02/2017   MCV 85.1 12/02/2017   PLT 232 12/02/2017   Lab Results  Component Value Date   NA 140 12/02/2017   K 3.4 (L) 12/02/2017   CL 103 12/02/2017   CO2 27 12/02/2017   Lab Results  Component Value Date   ALT 7 12/02/2017   AST 15 12/02/2017   ALKPHOS 76 12/02/2017   BILITOT 0.6 12/02/2017    PULMONARY FUNCTION TEST:   Recent Review Flowsheet Data    There is no flowsheet data to display.      RADIOGRAPHY: Dg Chest 2 View  Result Date: 11/23/2017 CLINICAL DATA:  Preop EXAM: CHEST - 2 VIEW COMPARISON:  , CT chest of 10/26/2017 and PET-CT of 11/08/2017 FINDINGS: There is a moderately large  rounded mass within the right middle lobe proximally consistent with primary lung carcinoma when compared with prior CT and PET-CT studies. However the remainder of the lungs are clear with only mild linear atelectasis or scarring at the left lung base. Mediastinal hilar contours are unremarkable and the heart is within upper limits of normal. Moderate thoracic aortic atherosclerosis is present. No acute bony abnormality is seen. IMPRESSION: Large right middle lobe mass consistent with primary lung carcinoma as noted above. No other abnormality is seen. Electronically Signed   By: Ivar Drape M.D.   On: 11/23/2017 12:19   Nm Pet Image Initial (pi) Skull Base To Thigh  Result Date: 11/08/2017 CLINICAL DATA:  Initial treatment strategy for cavitary right lung mass. EXAM: NUCLEAR MEDICINE PET SKULL BASE TO THIGH TECHNIQUE: 9.18 mCi F-18 FDG was injected intravenously. Full-ring PET imaging was performed from the skull base to thigh after the radiotracer. CT data was obtained and used for attenuation correction and anatomic localization. Fasting blood glucose: 109 mg/dl COMPARISON:  Chest CT 10/26/2017 FINDINGS: Mediastinal blood pool activity: SUV max 2.93 NECK: No hypermetabolic lymph nodes in the neck. Incidental CT findings: none CHEST: 9 mm supraclavicular lymph node on the right side on image number 40 is hypermetabolic with SUV max of 3.89 and may be amenable to ultrasound-guided percutaneous biopsy. Large (6.5 x 5.0 cm) cavitary right middle lobe lung lesion is hypermetabolic with SUV max of 37.34. Adjacent right hilar lymphadenopathy with SUV max of 7.2. 8 mm subcarinal lymph node on image number 68 is hypermetabolic with SUV max of 2.87. No other pulmonary nodules or hypermetabolic mediastinal lymph nodes. Incidental CT findings: Postobstructive pneumonitis is noted in the right middle lobe. Stable ascending thoracic aortic aneurysm with dense calcification. Maximum measures 5.1 cm. ABDOMEN/PELVIS: Small  focus of hypermetabolism near the inferior margin of the left adrenal gland but no obvious nodule. SUV max is 3.65 but I do not think this represents metastatic disease. No hepatic lesions and no abdominal/pelvic lymphadenopathy. In the right lower quadrant/pelvis there is a spigelian type hernia on the right side with a soft tissue nodule. I think it is possible this is some type of postoperative repair/plug with a small recurrent hernia. This is hypermetabolic with SUV max of 68.1. I doubt this is metastatic disease. It is much more likely some type of postinflammatory or post surgical process. Incidental CT findings: Advanced atherosclerotic calcifications involving the aorta iliac arteries. Multiple renal cysts are noted. SKELETON: Diffuse patchy marrow activity possibly related to anemia. I do not see a discrete bone lesions to suggest metastatic disease. Incidental CT findings: none IMPRESSION: 1. Large cavitary right middle lobe lung mass is hypermetabolic and consistent with primary lung  neoplasm. There is also adjacent right hilar adenopathy and subcarinal adenopathy. 2. 8 mm right supraclavicular lymph node is hypermetabolic and likely represents metastatic disease. This may be amenable to ultrasound-guided biopsy. 3. No definite findings for metastatic disease involving the liver or adrenal glands. 4. Elongated soft tissue nodule in a right lower quadrant hernia is indeterminate but most likely a benign inflammatory or postinflammatory/postsurgical process. Electronically Signed   By: Marijo Sanes M.D.   On: 11/08/2017 15:19   Ir US Guide Bx Asp/drain  Result Date: 11/17/2017 INDICATION: 84 year old female with a cavitary right upper lobe pulmonary mass and mediastinal adenopathy concerning for primary bronchogenic carcinoma. Additionally, she has a hypermetabolic right supraclavicular lymph node concerning for in 3 disease. She presents for ultrasound-guided biopsy of the same. EXAM:  Ultrasound-guided biopsy right supraclavicular lymph node MEDICATIONS: None. ANESTHESIA/SEDATION: Moderate (conscious) sedation was employed during this procedure. A total of Versed 1 mg and Fentanyl 75 mcg was administered intravenously. Moderate Sedation Time: 15 minutes. The patient's level of consciousness and vital signs were monitored continuously by radiology nursing throughout the procedure under my direct supervision. FLUOROSCOPY TIME:  Fluoroscopy Time: 0 minutes 0 seconds (0 mGy). COMPLICATIONS: None immediate. PROCEDURE: Informed written consent was obtained from the patient after a thorough discussion of the procedural risks, benefits and alternatives. All questions were addressed. A timeout was performed prior to the initiation of the procedure. The right supraclavicular fossa was interrogated with ultrasound. A hypoechoic and enlarged supraclavicular lymph node is identified. Although enlarged, the node measures just over 1 cm in diameter and lies immediately adjacent to the vascular structures. A suitable skin entry site was selected and marked. The region was sterilely prepped and draped in standard fashion using chlorhexidine skin prep. Local anesthesia was attained by infiltration with 1% lidocaine. A small dermatotomy was made. Under real-time sonographic guidance, several 18 gauge core biopsies were obtained using the Bard mission automated biopsy device. Biopsy specimens were placed in formalin and delivered to pathology for further analysis. Post biopsy ultrasound imaging demonstrates no immediate complication. The patient tolerated the procedure well. IMPRESSION: Ultrasound-guided core biopsy right supraclavicular lymph node. Electronically Signed   By: Jacqulynn Cadet M.D.   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 To complete the patient's staging workup, she will undergo a brain MRI next week.   Today, I talked to the patient and her friends about the findings and work-up thus far.  We discussed the natural history of lung cancer and general treatment, highlighting the role of radiotherapy in the management.  We discussed the available radiation techniques, and focused on the details of logistics and delivery.  We reviewed the anticipated acute and late sequelae associated with radiation in this setting.  The patient was encouraged to ask questions that I answered to the best of my ability.  A patient consent form was discussed and signed.  We retained a copy for our records.  The patient would like to proceed with radiation and will be scheduled for CT simulation.  PLAN: Patient will be scheduled for CT simulation early next week. A brain MRI is scheduled for next week. The patient will begin combined modality therapy the week of December 13, 2017.  Anticipate 6 weeks of radiation therapy.      ------------------------------------------------  Blair Promise, PhD,  MD  This document serves as a record of services personally performed by Gery Pray, MD. It was created on his behalf by Wilburn Mylar, a trained medical scribe. The creation of this record is based on the scribe's personal observations and the provider's statements to them. This document has been checked and approved by the attending provider.

## 2017-12-02 NOTE — Progress Notes (Signed)
[name] Clinical Social Work  Clinical Social Work met with patient/family and Futures trader at [facility] appointment to offer support and assess for psychosocial needs.  Medical oncologist reviewed patient's diagnosis and recommended treatment plan with patient/family. Patient was accompanied by her friend and granddaughter. Patient lives with spouse, she serves as his primary caregiver as he has many health issues.  Lisa [name] reported feeling overwhelmed and is unsure how to identify any specific concerns at this time.   Clinical Social Work briefly discussed Clinical Social Work role and [employer] support programs/services.  Clinical Social Work encouraged patient to call with any additional questions or concerns.   [name], MSW, LCSW, OSW-C Clinical Social Worker [facility] [phone]

## 2017-12-02 NOTE — Therapy (Signed)
[place], [place], [address] Phone: [phone]   Fax:  [phone]  Physical Therapy Evaluation  Patient Details  Name: Lisa [name] MRN: [id number] Date of Birth: 04-13-1932 Referring Provider: Dr. Julien [name]   Encounter Date: 12/02/2017  PT End of Session - 12/02/17 1604    Visit Number  1    Number of Visits  1    PT Start Time  9233    PT Stop Time  1555    PT Time Calculation (min)  20 min    Activity Tolerance  Patient tolerated treatment well    Behavior During Therapy  [facility] for tasks assessed/performed       Past Medical History:  Diagnosis Date  . Diverticulosis   . Headache   . Hemorrhoids    internal and external  . Hyperlipidemia 01/29/1991  . Hypertension 10/29/1995  . Hypothyroidism 10/29/1995  . Lung mass    right middle lobe  . Melanoma ([place])    h/o, local excision. no chemo or rady.   . Skin cancer (melanoma) ([place])   . Tubular adenoma of colon   . Wears dentures     Past Surgical History:  Procedure Laterality Date  . BRONCHIAL BIOPSY  11/23/2017   Procedure: BRONCHIAL BIOPSIES;  Surgeon: Lisa Isaac, MD;  Location: [facility] OR;  Service: Thoracic;;  . cataract surgery  2007, 2008   repair bilat  . DG BARIUM ENEMA ([place] HX) N/A 2016   [name]  . HERNIA REPAIR  04/25/2001  . IR US GUIDE BX ASP/DRAIN  11/17/2017  . KNEE SURGERY     meniscus tear per Dr. Ronnie [name], R knee  . MULTIPLE TOOTH EXTRACTIONS    . SEPTOPLASTY  2006   Dr. Ernesto [name]  . SKIN CANCER EXCISION    . TONSILLECTOMY  1954  . VAGINAL HYSTERECTOMY    . VIDEO BRONCHOSCOPY WITH ENDOBRONCHIAL ULTRASOUND N/A 11/23/2017   Procedure: VIDEO BRONCHOSCOPY WITH ENDOBRONCHIAL ULTRASOUND;  Surgeon: Lisa Isaac, MD;  Location: [facility];  Service: Thoracic;  Laterality: N/A;    There were no vitals filed for this visit.   Subjective Assessment - 12/02/17 1557    Subjective  I think I'm doing pretty good     Patient is accompained by:  Family member    Pertinent History  History of skin cancer x 3 HTN, colon adenoma, 30year smoker, stage IIB lung cancer right middle lobe    Limitations  Walking;Standing;House hold activities    How long can you walk comfortably?  just enough to get around the house    Currently in Pain?  No/denies         [facility] PT Assessment - 12/02/17 0001      Assessment   Medical Diagnosis  Right middle lobe lung mass     Referring Provider  Dr. Julien [name]    Onset Date/Surgical Date  10/25/17    Hand Dominance  Right    Next MD Visit  today    Prior Therapy  no      Precautions   Precaution Comments  cancer      Restrictions   Weight Bearing Restrictions  No      Balance Screen   Has the patient fallen in the past 6 months  No    Has the patient had a decrease in activity level because of a fear of falling?   No    Is the patient reluctant to leave their home  because of a fear of falling?   No      Home Environment   Living Environment  Private residence    Living Arrangements  Alone    Available Help at Discharge  Family      Prior Function   Level of Independence  Independent with basic ADLs    Vocation  Retired    Leisure  going out to eat      Cognition   Overall Cognitive Status  Within Functional Limits for tasks assessed      Functional Tests   Functional tests  Single leg stance;Sit to Stand;Other      Sit to Stand   Comments  x 6 in 30 seconds      Other:   Other/ Comments  functional reach of 8"       ROM / Strength   AROM / PROM / Strength  Strength      Strength   Overall Strength Comments  seated hip flexor: 4-/5, quads: 4-/5                Objective measurements completed on examination: See above findings.              PT Education - 12/02/17 1604    Education Details  Given postural and breathing exercise handout, walking program handout and encouragement, activity modification handout    Person(s)  Educated  Patient;Child(ren)    Methods  Explanation;Handout    Comprehension  Verbalized understanding            Lung Clinic Goals - 12/02/17 1608      Patient will be able to verbalize understanding of the benefit of exercise to decrease fatigue.   Status  Achieved      Patient will be able to verbalize the importance of posture.   Status  Achieved      Patient will be able to demonstrate diaphragmatic breathing for improved lung function.   Status  Deferred      Patient will be able to verbalize understanding of the role of physical therapy to prevent functional decline and who to contact if physical therapy is needed.   Status  Achieved           Plan - 12/02/17 1604    Clinical Impression Statement  [name] presents to [facility] today to get information from all providers and for a baseline assessment of status prior to chemotherapy treatment.  She has poor strength and endurance overall and has difficulty with extra activity outside of of ADLs which she does minimally.  She was encouraged to perform walking in the home as much as possible and was educated on PT services if needed    History and Personal Factors relevant to plan of care:  History of skin cancer x 3 HTN, colon adenoma, 30year smoker, stage IIB lung cancer right middle lobe    Clinical Presentation  Stable    Clinical Decision Making  Low    PT Frequency  One time visit    PT Treatment/Interventions  Patient/family education    Consulted and Agree with Plan of Care  Patient;Family member/caregiver       Patient will benefit from skilled therapeutic intervention in order to improve the following deficits and impairments:     Visit Diagnosis: Primary cancer of right middle lobe of lung ([place])  Other abnormalities of gait and mobility     Problem List Patient Active Problem List   Diagnosis Date Noted  .  Vitamin D deficiency 06/19/2016  . Elevated serum creatinine 06/19/2016  . Advance care  planning 06/08/2014  . Medicare annual wellness visit, subsequent 05/19/2012  . Dysuria 11/26/2010  . Bilateral leg edema 11/26/2010  . OBESITY 12/15/2006  . Hypothyroidism 10/29/1995  . Essential hypertension 10/29/1995  . HLD (hyperlipidemia) 01/29/1991    Lisa [name], PT 12/02/2017, 4:09 PM  [place], [place], [address] Phone: [phone]   Fax:  [phone]  Name: Lisa [name] MRN: [id number] Date of Birth: 1933/03/27

## 2017-12-03 ENCOUNTER — Encounter: Payer: Self-pay | Admitting: *Deleted

## 2017-12-03 NOTE — Progress Notes (Signed)
Oncology Nurse Navigator Documentation  Oncology Nurse Navigator Flowsheets 12/03/2017  Navigator Location [facility]  Navigator Encounter Type Clinic/MDC/I spoke with patient and family today.  I gave and explained information on patients dx and treatment plan.  I also updated on next steps with scan and schedule.  Per Dr. Julien Nordmann, he would like patient tissue obtained on 8/21 to be sent for PDL 1.  I will updated pathology dept.   Abnormal Finding Date 10/26/2017  Multidisiplinary Clinic Date 12/02/2017  Patient Visit Type MedOnc  Treatment Phase Pre-Tx/Tx Discussion  Barriers/Navigation Needs Education  Education Understanding Cancer/ Treatment Options;Newly Diagnosed Cancer Education  Interventions Education  Education Method Verbal;Written  Acuity Level 2  Time Spent with Patient 30

## 2017-12-07 ENCOUNTER — Ambulatory Visit (HOSPITAL_COMMUNITY)
Admission: RE | Admit: 2017-12-07 | Discharge: 2017-12-07 | Disposition: A | Discharge status: Home / Self Care | Payer: Medicare Other | Source: Ambulatory Visit | Attending: Internal Medicine | Admitting: Internal Medicine

## 2017-12-07 ENCOUNTER — Inpatient Hospital Stay: Payer: Medicare Other

## 2017-12-07 DIAGNOSIS — C3490 Malignant neoplasm of unspecified part of unspecified bronchus or lung: Secondary | ICD-10-CM | POA: Insufficient documentation

## 2017-12-07 DIAGNOSIS — I6782 Cerebral ischemia: Secondary | ICD-10-CM | POA: Diagnosis not present

## 2017-12-07 MED ORDER — GADOBUTROL 1 MMOL/ML IV SOLN
8.0000 mL | Freq: Once | INTRAVENOUS | Status: AC | PRN
  Administered 2017-12-07: 8 mL via INTRAVENOUS

## 2017-12-08 ENCOUNTER — Ambulatory Visit
Admission: RE | Admit: 2017-12-08 | Discharge: 2017-12-08 | Disposition: A | Discharge status: Home / Self Care | Payer: Medicare Other | Source: Ambulatory Visit | Attending: Radiation Oncology | Admitting: Radiation Oncology

## 2017-12-08 DIAGNOSIS — C3491 Malignant neoplasm of unspecified part of right bronchus or lung: Secondary | ICD-10-CM | POA: Diagnosis not present

## 2017-12-08 DIAGNOSIS — C342 Malignant neoplasm of middle lobe, bronchus or lung: Secondary | ICD-10-CM | POA: Diagnosis not present

## 2017-12-08 DIAGNOSIS — Z510 Encounter for antineoplastic radiation therapy: Secondary | ICD-10-CM | POA: Diagnosis not present

## 2017-12-08 NOTE — Progress Notes (Signed)
  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  Identity was confirmed.  All relevant records and images related to the planned course of therapy were reviewed.  The patient freely provided informed written consent to proceed with treatment after reviewing the details related to the planned course of therapy. The consent form was witnessed and verified by the simulation staff.  Then, the patient was set-up in a stable reproducible  supine position for radiation therapy.  CT images were obtained.  Surface markings were placed.  The CT images were loaded into the planning software.  Then the target and avoidance structures were contoured.  Treatment planning then occurred.  The radiation prescription was entered and confirmed.  Then, I designed and supervised the construction of a total of 5 medically necessary complex treatment devices.  I have requested : 3D Simulation  I have requested a DVH of the following structures: GTV, PTV, heart, spinal cord, lungs, esophagus.  I have ordered:dose calc.  PLAN:  The patient will receive 60 Gy in 30 fractions along with radiosensitizing chemotherapy.  -----------------------------------  Blair Promise, PhD, MD  This document serves as a record of services personally performed by Gery Pray, MD. It was created on his behalf by [facility], a trained medical scribe. The creation of this record is based on the scribe's personal observations and the provider's statements to them. This document has been checked and approved by the attending provider.

## 2017-12-10 ENCOUNTER — Telehealth: Payer: Self-pay | Admitting: Internal Medicine

## 2017-12-10 ENCOUNTER — Encounter: Payer: Self-pay | Admitting: *Deleted

## 2017-12-10 NOTE — Progress Notes (Signed)
Per Dr. Julien Nordmann, okay to use labs from 9/5 for first chemo treatment on 9/16. He is aware of elevated serum creatinine.   Demetrius Charity, PharmD Oncology Pharmacist Pharmacy Phone: [phone] 12/10/2017

## 2017-12-10 NOTE — Telephone Encounter (Signed)
Scheduled appt per 9/05 los - called patient to make sure they were aware of appts - pt is aware and states she will get an updated schedule when she comes in on 9/16

## 2017-12-10 NOTE — Progress Notes (Signed)
Oncology Nurse Navigator Documentation  Oncology Nurse Navigator Flowsheets 12/10/2017  Navigator Location [facility]  Navigator Encounter Type Other/I followed up on Lisa [name] schedule and noted some appts were missing.  I updated scheduling team with an update on needed appts.   Treatment Phase Pre-Tx/Tx Discussion  Barriers/Navigation Needs Coordination of Care  Interventions Coordination of Care  Coordination of Care Other  Acuity Level 1  Time Spent with Patient 30

## 2017-12-11 ENCOUNTER — Other Ambulatory Visit: Payer: Self-pay | Admitting: Internal Medicine

## 2017-12-11 DIAGNOSIS — C3491 Malignant neoplasm of unspecified part of right bronchus or lung: Secondary | ICD-10-CM

## 2017-12-13 ENCOUNTER — Inpatient Hospital Stay: Payer: Medicare Other

## 2017-12-13 ENCOUNTER — Ambulatory Visit: Payer: Medicare Other

## 2017-12-13 ENCOUNTER — Telehealth: Payer: Self-pay

## 2017-12-13 ENCOUNTER — Telehealth: Payer: Self-pay | Admitting: Medical Oncology

## 2017-12-13 ENCOUNTER — Other Ambulatory Visit: Payer: Self-pay | Admitting: Internal Medicine

## 2017-12-13 VITALS — BP 100/60 | HR 68 | Temp 98.0°F | Resp 18

## 2017-12-13 DIAGNOSIS — Z510 Encounter for antineoplastic radiation therapy: Secondary | ICD-10-CM | POA: Diagnosis not present

## 2017-12-13 DIAGNOSIS — Z87891 Personal history of nicotine dependence: Secondary | ICD-10-CM | POA: Diagnosis not present

## 2017-12-13 DIAGNOSIS — R531 Weakness: Secondary | ICD-10-CM | POA: Diagnosis not present

## 2017-12-13 DIAGNOSIS — C3491 Malignant neoplasm of unspecified part of right bronchus or lung: Secondary | ICD-10-CM

## 2017-12-13 DIAGNOSIS — Z23 Encounter for immunization: Secondary | ICD-10-CM | POA: Diagnosis not present

## 2017-12-13 DIAGNOSIS — Z85820 Personal history of malignant melanoma of skin: Secondary | ICD-10-CM | POA: Diagnosis not present

## 2017-12-13 DIAGNOSIS — I10 Essential (primary) hypertension: Secondary | ICD-10-CM | POA: Diagnosis not present

## 2017-12-13 DIAGNOSIS — E785 Hyperlipidemia, unspecified: Secondary | ICD-10-CM | POA: Diagnosis not present

## 2017-12-13 DIAGNOSIS — I700 Atherosclerosis of aorta: Secondary | ICD-10-CM | POA: Diagnosis not present

## 2017-12-13 DIAGNOSIS — Z800 Family history of malignant neoplasm of digestive organs: Secondary | ICD-10-CM | POA: Diagnosis not present

## 2017-12-13 DIAGNOSIS — C3411 Malignant neoplasm of upper lobe, right bronchus or lung: Secondary | ICD-10-CM | POA: Diagnosis not present

## 2017-12-13 DIAGNOSIS — Z79899 Other long term (current) drug therapy: Secondary | ICD-10-CM | POA: Diagnosis not present

## 2017-12-13 DIAGNOSIS — R590 Localized enlarged lymph nodes: Secondary | ICD-10-CM | POA: Diagnosis not present

## 2017-12-13 DIAGNOSIS — R42 Dizziness and giddiness: Secondary | ICD-10-CM | POA: Diagnosis not present

## 2017-12-13 DIAGNOSIS — Z5111 Encounter for antineoplastic chemotherapy: Secondary | ICD-10-CM | POA: Diagnosis not present

## 2017-12-13 DIAGNOSIS — E039 Hypothyroidism, unspecified: Secondary | ICD-10-CM | POA: Diagnosis not present

## 2017-12-13 DIAGNOSIS — C342 Malignant neoplasm of middle lobe, bronchus or lung: Secondary | ICD-10-CM | POA: Diagnosis not present

## 2017-12-13 DIAGNOSIS — R5383 Other fatigue: Secondary | ICD-10-CM | POA: Diagnosis not present

## 2017-12-13 LAB — CBC WITH DIFFERENTIAL (CANCER CENTER ONLY)
Basophils Absolute: 0.1 10*3/uL (ref 0.0–0.1)
Basophils Relative: 1 %
Eosinophils Absolute: 0 10*3/uL (ref 0.0–0.5)
Eosinophils Relative: 0 %
HCT: 31 % — ABNORMAL LOW (ref 34.8–46.6)
Hemoglobin: 10.3 g/dL — ABNORMAL LOW (ref 11.6–15.9)
Lymphocytes Relative: 12 %
Lymphs Abs: 1 10*3/uL (ref 0.9–3.3)
MCH: 27.1 pg (ref 25.1–34.0)
MCHC: 33.3 g/dL (ref 31.5–36.0)
MCV: 81.3 fL (ref 79.5–101.0)
Monocytes Absolute: 0.7 10*3/uL (ref 0.1–0.9)
Monocytes Relative: 8 %
Neutro Abs: 6.6 10*3/uL — ABNORMAL HIGH (ref 1.5–6.5)
Neutrophils Relative %: 79 %
Platelet Count: 240 10*3/uL (ref 145–400)
RBC: 3.81 MIL/uL (ref 3.70–5.45)
RDW: 15.5 % — ABNORMAL HIGH (ref 11.2–14.5)
WBC Count: 8.3 10*3/uL (ref 3.9–10.3)

## 2017-12-13 LAB — CMP (CANCER CENTER ONLY)
ALT: 6 U/L (ref 0–44)
AST: 15 U/L (ref 15–41)
Albumin: 3.1 g/dL — ABNORMAL LOW (ref 3.5–5.0)
Alkaline Phosphatase: 71 U/L (ref 38–126)
Anion gap: 12 (ref 5–15)
BUN: 20 mg/dL (ref 8–23)
CO2: 23 mmol/L (ref 22–32)
Calcium: 10.3 mg/dL (ref 8.9–10.3)
Chloride: 102 mmol/L (ref 98–111)
Creatinine: 1.53 mg/dL — ABNORMAL HIGH (ref 0.44–1.00)
GFR, Est AFR Am: 35 mL/min — ABNORMAL LOW (ref >60)
GFR, Estimated: 30 mL/min — ABNORMAL LOW (ref >60)
Glucose, Bld: 108 mg/dL — ABNORMAL HIGH (ref 70–99)
Potassium: 3.4 mmol/L — ABNORMAL LOW (ref 3.5–5.1)
Sodium: 137 mmol/L (ref 135–145)
Total Bilirubin: 1.1 mg/dL (ref 0.3–1.2)
Total Protein: 7.4 g/dL (ref 6.5–8.1)

## 2017-12-13 MED ORDER — SODIUM CHLORIDE 0.9 % IV SOLN
Freq: Once | INTRAVENOUS | Status: AC
  Administered 2017-12-13: 09:00:00 via INTRAVENOUS
  Filled 2017-12-13: qty 250

## 2017-12-13 MED ORDER — DIPHENHYDRAMINE HCL 50 MG/ML IJ SOLN
INTRAMUSCULAR | Status: AC
  Filled 2017-12-13: qty 1

## 2017-12-13 MED ORDER — DIPHENHYDRAMINE HCL 50 MG/ML IJ SOLN
50.0000 mg | Freq: Once | INTRAMUSCULAR | Status: AC
  Administered 2017-12-13: 50 mg via INTRAVENOUS

## 2017-12-13 MED ORDER — FAMOTIDINE IN NACL 20-0.9 MG/50ML-% IV SOLN
20.0000 mg | Freq: Once | INTRAVENOUS | Status: AC
  Administered 2017-12-13: 20 mg via INTRAVENOUS

## 2017-12-13 MED ORDER — SODIUM CHLORIDE 0.9 % IV SOLN
45.0000 mg/m2 | Freq: Once | INTRAVENOUS | Status: AC
  Administered 2017-12-13: 84 mg via INTRAVENOUS
  Filled 2017-12-13: qty 14

## 2017-12-13 MED ORDER — SODIUM CHLORIDE 0.9 % IV SOLN
20.0000 mg | Freq: Once | INTRAVENOUS | Status: AC
  Administered 2017-12-13: 20 mg via INTRAVENOUS
  Filled 2017-12-13: qty 2

## 2017-12-13 MED ORDER — PALONOSETRON HCL INJECTION 0.25 MG/5ML
0.2500 mg | Freq: Once | INTRAVENOUS | Status: AC
  Administered 2017-12-13: 0.25 mg via INTRAVENOUS

## 2017-12-13 MED ORDER — SODIUM CHLORIDE 0.9 % IV SOLN
117.2000 mg | Freq: Once | INTRAVENOUS | Status: AC
  Administered 2017-12-13: 120 mg via INTRAVENOUS
  Filled 2017-12-13: qty 12

## 2017-12-13 MED ORDER — FAMOTIDINE IN NACL 20-0.9 MG/50ML-% IV SOLN
INTRAVENOUS | Status: AC
  Filled 2017-12-13: qty 50

## 2017-12-13 MED ORDER — PALONOSETRON HCL INJECTION 0.25 MG/5ML
INTRAVENOUS | Status: AC
  Filled 2017-12-13: qty 5

## 2017-12-13 NOTE — Telephone Encounter (Signed)
Printed avs and calender of upcoming appointment. Per 9/16 los

## 2017-12-13 NOTE — Telephone Encounter (Signed)
Benadryl dose - received 50 mg and was very groggy . Requests to consider dose reduction at next treatment.

## 2017-12-13 NOTE — Progress Notes (Signed)
Per Julien Nordmann pt is okay to treat today today with taxol and carboplatin and Creatinine 1.53.

## 2017-12-13 NOTE — Progress Notes (Signed)
Pt reporting dizziness and 'feeling like my tongue is swollen' during dexamethasone infusion.  Pt received aloxi and benadryl 50 IV before.  MD Irene Limbo called to assess pt chairside.  Decadron stopped and NS bolus hung.  Per MD Irene Limbo pt shows no signs of swelling or allergic reaction. Speech clear.  Denies CP/SOB.  VSS.  Pt A&Ox4 but shows some sluggishness.  States "I don't normally take a lot of medications".  Verbal order from MD [facility] to continue with treatment.  Pt agreed.

## 2017-12-13 NOTE — Patient Instructions (Signed)
[facility] Discharge Instructions for Patients Receiving Chemotherapy  Today you received the following chemotherapy agents: Paclitaxel (Taxol) and Carboplatin  To help prevent nausea and vomiting after your treatment, we encourage you to take your nausea medication as prescribed.  Do not take your zofran (ondansteron) for three days following treatment.   If you develop nausea and vomiting that is not controlled by your nausea medication, call the clinic.   BELOW ARE SYMPTOMS THAT SHOULD BE REPORTED IMMEDIATELY:  *FEVER GREATER THAN 100.5 F  *CHILLS WITH OR WITHOUT FEVER  NAUSEA AND VOMITING THAT IS NOT CONTROLLED WITH YOUR NAUSEA MEDICATION  *UNUSUAL SHORTNESS OF BREATH  *UNUSUAL BRUISING OR BLEEDING  TENDERNESS IN MOUTH AND THROAT WITH OR WITHOUT PRESENCE OF ULCERS  *URINARY PROBLEMS  *BOWEL PROBLEMS  UNUSUAL RASH Items with * indicate a potential emergency and should be followed up as soon as possible.  Feel free to call the clinic should you have any questions or concerns. The clinic phone number is [phone].  Please show the [place] at check-in to the Emergency Department and triage nurse.    Paclitaxel injection What is this medicine? PACLITAXEL (PAK li TAX el) is a chemotherapy drug. It targets fast dividing cells, like cancer cells, and causes these cells to die. This medicine is used to treat ovarian cancer, breast cancer, and other cancers. This medicine may be used for other purposes; ask your health care provider or pharmacist if you have questions. COMMON BRAND NAME(S): Onxol, Taxol What should I tell my health care provider before I take this medicine? They need to know if you have any of these conditions: -blood disorders -irregular heartbeat -infection (especially a virus infection such as chickenpox, cold sores, or herpes) -liver disease -previous or ongoing radiation therapy -an unusual or allergic reaction to  paclitaxel, alcohol, polyoxyethylated castor oil, other chemotherapy agents, other medicines, foods, dyes, or preservatives -pregnant or trying to get pregnant -breast-feeding How should I use this medicine? This drug is given as an infusion into a vein. It is administered in a hospital or clinic by a specially trained health care professional. Talk to your pediatrician regarding the use of this medicine in children. Special care may be needed. Overdosage: If you think you have taken too much of this medicine contact a poison control center or emergency room at once. NOTE: This medicine is only for you. Do not share this medicine with others. What if I miss a dose? It is important not to miss your dose. Call your doctor or health care professional if you are unable to keep an appointment. What may interact with this medicine? Do not take this medicine with any of the following medications: -disulfiram -metronidazole This medicine may also interact with the following medications: -cyclosporine -diazepam -ketoconazole -medicines to increase blood counts like filgrastim, pegfilgrastim, sargramostim -other chemotherapy drugs like cisplatin, doxorubicin, epirubicin, etoposide, teniposide, vincristine -quinidine -testosterone -vaccines -verapamil Talk to your doctor or health care professional before taking any of these medicines: -acetaminophen -aspirin -ibuprofen -ketoprofen -naproxen This list may not describe all possible interactions. Give your health care provider a list of all the medicines, herbs, non-prescription drugs, or dietary supplements you use. Also tell them if you smoke, drink alcohol, or use illegal drugs. Some items may interact with your medicine. What should I watch for while using this medicine? Your condition will be monitored carefully while you are receiving this medicine. You will need important blood work done while you are taking this  medicine. This medicine  can cause serious allergic reactions. To reduce your risk you will need to take other medicine(s) before treatment with this medicine. If you experience allergic reactions like skin rash, itching or hives, swelling of the face, lips, or tongue, tell your doctor or health care professional right away. In some cases, you may be given additional medicines to help with side effects. Follow all directions for their use. This drug may make you feel generally unwell. This is not uncommon, as chemotherapy can affect healthy cells as well as cancer cells. Report any side effects. Continue your course of treatment even though you feel ill unless your doctor tells you to stop. Call your doctor or health care professional for advice if you get a fever, chills or sore throat, or other symptoms of a cold or flu. Do not treat yourself. This drug decreases your body's ability to fight infections. Try to avoid being around people who are sick. This medicine may increase your risk to bruise or bleed. Call your doctor or health care professional if you notice any unusual bleeding. Be careful brushing and flossing your teeth or using a toothpick because you may get an infection or bleed more easily. If you have any dental work done, tell your dentist you are receiving this medicine. Avoid taking products that contain aspirin, acetaminophen, ibuprofen, naproxen, or ketoprofen unless instructed by your doctor. These medicines may hide a fever. Do not become pregnant while taking this medicine. Women should inform their doctor if they wish to become pregnant or think they might be pregnant. There is a potential for serious side effects to an unborn child. Talk to your health care professional or pharmacist for more information. Do not breast-feed an infant while taking this medicine. Men are advised not to father a child while receiving this medicine. This product may contain alcohol. Ask your pharmacist or healthcare provider if  this medicine contains alcohol. Be sure to tell all healthcare providers you are taking this medicine. Certain medicines, like metronidazole and disulfiram, can cause an unpleasant reaction when taken with alcohol. The reaction includes flushing, headache, nausea, vomiting, sweating, and increased thirst. The reaction can last from 30 minutes to several hours. What side effects may I notice from receiving this medicine? Side effects that you should report to your doctor or health care professional as soon as possible: -allergic reactions like skin rash, itching or hives, swelling of the face, lips, or tongue -low blood counts - This drug may decrease the number of white blood cells, red blood cells and platelets. You may be at increased risk for infections and bleeding. -signs of infection - fever or chills, cough, sore throat, pain or difficulty passing urine -signs of decreased platelets or bleeding - bruising, pinpoint red spots on the skin, black, tarry stools, nosebleeds -signs of decreased red blood cells - unusually weak or tired, fainting spells, lightheadedness -breathing problems -chest pain -high or low blood pressure -mouth sores -nausea and vomiting -pain, swelling, redness or irritation at the injection site -pain, tingling, numbness in the hands or feet -slow or irregular heartbeat -swelling of the ankle, feet, hands Side effects that usually do not require medical attention (report to your doctor or health care professional if they continue or are bothersome): -bone pain -complete hair loss including hair on your head, underarms, pubic hair, eyebrows, and eyelashes -changes in the color of fingernails -diarrhea -loosening of the fingernails -loss of appetite -muscle or joint pain -red flush to skin -sweating  This list may not describe all possible side effects. Call your doctor for medical advice about side effects. You may report side effects to FDA at  1-800-FDA-1088. Where should I keep my medicine? This drug is given in a hospital or clinic and will not be stored at home. NOTE: This sheet is a summary. It may not cover all possible information. If you have questions about this medicine, talk to your doctor, pharmacist, or health care provider.  2018 Elsevier/Gold Standard (2015-01-15 19:58:00)   Carboplatin injection What is this medicine? CARBOPLATIN (KAR boe pla tin) is a chemotherapy drug. It targets fast dividing cells, like cancer cells, and causes these cells to die. This medicine is used to treat ovarian cancer and many other cancers. This medicine may be used for other purposes; ask your health care provider or pharmacist if you have questions. COMMON BRAND NAME(S): Paraplatin What should I tell my health care provider before I take this medicine? They need to know if you have any of these conditions: -blood disorders -hearing problems -kidney disease -recent or ongoing radiation therapy -an unusual or allergic reaction to carboplatin, cisplatin, other chemotherapy, other medicines, foods, dyes, or preservatives -pregnant or trying to get pregnant -breast-feeding How should I use this medicine? This drug is usually given as an infusion into a vein. It is administered in a hospital or clinic by a specially trained health care professional. Talk to your pediatrician regarding the use of this medicine in children. Special care may be needed. Overdosage: If you think you have taken too much of this medicine contact a poison control center or emergency room at once. NOTE: This medicine is only for you. Do not share this medicine with others. What if I miss a dose? It is important not to miss a dose. Call your doctor or health care professional if you are unable to keep an appointment. What may interact with this medicine? -medicines for seizures -medicines to increase blood counts like filgrastim, pegfilgrastim,  sargramostim -some antibiotics like amikacin, gentamicin, neomycin, streptomycin, tobramycin -vaccines Talk to your doctor or health care professional before taking any of these medicines: -acetaminophen -aspirin -ibuprofen -ketoprofen -naproxen This list may not describe all possible interactions. Give your health care provider a list of all the medicines, herbs, non-prescription drugs, or dietary supplements you use. Also tell them if you smoke, drink alcohol, or use illegal drugs. Some items may interact with your medicine. What should I watch for while using this medicine? Your condition will be monitored carefully while you are receiving this medicine. You will need important blood work done while you are taking this medicine. This drug may make you feel generally unwell. This is not uncommon, as chemotherapy can affect healthy cells as well as cancer cells. Report any side effects. Continue your course of treatment even though you feel ill unless your doctor tells you to stop. In some cases, you may be given additional medicines to help with side effects. Follow all directions for their use. Call your doctor or health care professional for advice if you get a fever, chills or sore throat, or other symptoms of a cold or flu. Do not treat yourself. This drug decreases your body's ability to fight infections. Try to avoid being around people who are sick. This medicine may increase your risk to bruise or bleed. Call your doctor or health care professional if you notice any unusual bleeding. Be careful brushing and flossing your teeth or using a toothpick because you may get  an infection or bleed more easily. If you have any dental work done, tell your dentist you are receiving this medicine. Avoid taking products that contain aspirin, acetaminophen, ibuprofen, naproxen, or ketoprofen unless instructed by your doctor. These medicines may hide a fever. Do not become pregnant while taking this  medicine. Women should inform their doctor if they wish to become pregnant or think they might be pregnant. There is a potential for serious side effects to an unborn child. Talk to your health care professional or pharmacist for more information. Do not breast-feed an infant while taking this medicine. What side effects may I notice from receiving this medicine? Side effects that you should report to your doctor or health care professional as soon as possible: -allergic reactions like skin rash, itching or hives, swelling of the face, lips, or tongue -signs of infection - fever or chills, cough, sore throat, pain or difficulty passing urine -signs of decreased platelets or bleeding - bruising, pinpoint red spots on the skin, black, tarry stools, nosebleeds -signs of decreased red blood cells - unusually weak or tired, fainting spells, lightheadedness -breathing problems -changes in hearing -changes in vision -chest pain -high blood pressure -low blood counts - This drug may decrease the number of white blood cells, red blood cells and platelets. You may be at increased risk for infections and bleeding. -nausea and vomiting -pain, swelling, redness or irritation at the injection site -pain, tingling, numbness in the hands or feet -problems with balance, talking, walking -trouble passing urine or change in the amount of urine Side effects that usually do not require medical attention (report to your doctor or health care professional if they continue or are bothersome): -hair loss -loss of appetite -metallic taste in the mouth or changes in taste This list may not describe all possible side effects. Call your doctor for medical advice about side effects. You may report side effects to FDA at 1-800-FDA-1088. Where should I keep my medicine? This drug is given in a hospital or clinic and will not be stored at home. NOTE: This sheet is a summary. It may not cover all possible information. If you  have questions about this medicine, talk to your doctor, pharmacist, or health care provider.  2018 Elsevier/Gold Standard (2007-06-21 14:38:05)

## 2017-12-14 ENCOUNTER — Ambulatory Visit
Admission: RE | Admit: 2017-12-14 | Discharge: 2017-12-14 | Disposition: A | Discharge status: Home / Self Care | Payer: Medicare Other | Source: Ambulatory Visit | Attending: Radiation Oncology | Admitting: Radiation Oncology

## 2017-12-14 DIAGNOSIS — Z510 Encounter for antineoplastic radiation therapy: Secondary | ICD-10-CM | POA: Diagnosis not present

## 2017-12-14 DIAGNOSIS — C3491 Malignant neoplasm of unspecified part of right bronchus or lung: Secondary | ICD-10-CM | POA: Diagnosis not present

## 2017-12-14 DIAGNOSIS — C342 Malignant neoplasm of middle lobe, bronchus or lung: Secondary | ICD-10-CM | POA: Diagnosis not present

## 2017-12-14 NOTE — Progress Notes (Signed)
  Radiation Oncology         [phone] ________________________________  Name: Lisa [name] MRN: [id number]  Date: 12/14/2017  DOB: 04/02/1932  Simulation Verification Note    ICD-10-CM   1. 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 The clinical setup was verified. Then port films were obtained and uploaded to the radiation oncology medical record software.  The treatment beams were carefully compared against the planned radiation fields. The position location and shape of the radiation fields was reviewed. They targeted volume of tissue appears to be appropriately covered by the radiation beams. Organs at risk appear to be excluded as planned.  Based on my personal review, I approved the simulation verification. The patient's treatment will proceed as planned.  -----------------------------------  Blair Promise, PhD, MD

## 2017-12-15 ENCOUNTER — Ambulatory Visit
Admission: RE | Admit: 2017-12-15 | Discharge: 2017-12-15 | Disposition: A | Discharge status: Home / Self Care | Payer: Medicare Other | Source: Ambulatory Visit | Attending: Radiation Oncology | Admitting: Radiation Oncology

## 2017-12-15 DIAGNOSIS — C3491 Malignant neoplasm of unspecified part of right bronchus or lung: Secondary | ICD-10-CM | POA: Diagnosis not present

## 2017-12-15 DIAGNOSIS — C342 Malignant neoplasm of middle lobe, bronchus or lung: Secondary | ICD-10-CM | POA: Diagnosis not present

## 2017-12-15 DIAGNOSIS — Z510 Encounter for antineoplastic radiation therapy: Secondary | ICD-10-CM | POA: Diagnosis not present

## 2017-12-15 DIAGNOSIS — Z5111 Encounter for antineoplastic chemotherapy: Secondary | ICD-10-CM

## 2017-12-15 MED ORDER — SONAFINE EX EMUL
1.0000 "application " | Freq: Two times a day (BID) | CUTANEOUS | Status: DC
  Administered 2017-12-15: 1 via TOPICAL

## 2017-12-16 ENCOUNTER — Ambulatory Visit
Admission: RE | Admit: 2017-12-16 | Discharge: 2017-12-16 | Disposition: A | Discharge status: Home / Self Care | Payer: Medicare Other | Source: Ambulatory Visit | Attending: Radiation Oncology | Admitting: Radiation Oncology

## 2017-12-16 ENCOUNTER — Telehealth: Payer: Self-pay | Admitting: Medical Oncology

## 2017-12-16 DIAGNOSIS — C3491 Malignant neoplasm of unspecified part of right bronchus or lung: Secondary | ICD-10-CM | POA: Diagnosis not present

## 2017-12-16 DIAGNOSIS — C342 Malignant neoplasm of middle lobe, bronchus or lung: Secondary | ICD-10-CM | POA: Diagnosis not present

## 2017-12-16 DIAGNOSIS — Z510 Encounter for antineoplastic radiation therapy: Secondary | ICD-10-CM | POA: Diagnosis not present

## 2017-12-16 NOTE — Telephone Encounter (Signed)
LVM to return chemotherapy follow-up call.

## 2017-12-17 ENCOUNTER — Ambulatory Visit
Admission: RE | Admit: 2017-12-17 | Discharge: 2017-12-17 | Disposition: A | Discharge status: Home / Self Care | Payer: Medicare Other | Source: Ambulatory Visit | Attending: Radiation Oncology | Admitting: Radiation Oncology

## 2017-12-17 DIAGNOSIS — Z510 Encounter for antineoplastic radiation therapy: Secondary | ICD-10-CM | POA: Diagnosis not present

## 2017-12-17 DIAGNOSIS — C342 Malignant neoplasm of middle lobe, bronchus or lung: Secondary | ICD-10-CM | POA: Diagnosis not present

## 2017-12-17 DIAGNOSIS — C3491 Malignant neoplasm of unspecified part of right bronchus or lung: Secondary | ICD-10-CM | POA: Diagnosis not present

## 2017-12-19 ENCOUNTER — Telehealth: Payer: Self-pay | Admitting: Family Medicine

## 2017-12-19 NOTE — Telephone Encounter (Signed)
I called patient about her recent developments.  I appreciate the help of all involved.  She is going to the cancer center for treatment all day on Mondays and then the afternoons on Tuesdays through Fridays.  I wished her the best.  She thanked me for the call.  She needs to get a flu shot.  She wanted to get it done here at the clinic.  Please call her on Tuesday morning and see when she would be able to come in for a flu shot.  I realize we may not have flu clinics up and running yet.  She may need a nurse visit.  Please schedule her for a nurse visit if needed in the morning on a Tuesday Wednesday Thursday or Friday.  This is an atypical situation given her cancer treatment schedule.  Thanks.

## 2017-12-20 ENCOUNTER — Encounter: Payer: Self-pay | Admitting: *Deleted

## 2017-12-20 ENCOUNTER — Telehealth: Payer: Self-pay

## 2017-12-20 ENCOUNTER — Inpatient Hospital Stay: Payer: Medicare Other

## 2017-12-20 ENCOUNTER — Inpatient Hospital Stay (HOSPITAL_BASED_OUTPATIENT_CLINIC_OR_DEPARTMENT_OTHER): Payer: Medicare Other | Admitting: Internal Medicine

## 2017-12-20 ENCOUNTER — Encounter: Payer: Self-pay | Admitting: Internal Medicine

## 2017-12-20 ENCOUNTER — Ambulatory Visit
Admission: RE | Admit: 2017-12-20 | Discharge: 2017-12-20 | Disposition: A | Discharge status: Home / Self Care | Payer: Medicare Other | Source: Ambulatory Visit | Attending: Radiation Oncology | Admitting: Radiation Oncology

## 2017-12-20 VITALS — BP 124/63 | HR 102 | Temp 97.8°F | Resp 18 | Ht 65.0 in | Wt 177.8 lb

## 2017-12-20 DIAGNOSIS — R42 Dizziness and giddiness: Secondary | ICD-10-CM | POA: Diagnosis not present

## 2017-12-20 DIAGNOSIS — Z23 Encounter for immunization: Secondary | ICD-10-CM

## 2017-12-20 DIAGNOSIS — Z87891 Personal history of nicotine dependence: Secondary | ICD-10-CM

## 2017-12-20 DIAGNOSIS — C3491 Malignant neoplasm of unspecified part of right bronchus or lung: Secondary | ICD-10-CM

## 2017-12-20 DIAGNOSIS — Z800 Family history of malignant neoplasm of digestive organs: Secondary | ICD-10-CM

## 2017-12-20 DIAGNOSIS — R590 Localized enlarged lymph nodes: Secondary | ICD-10-CM

## 2017-12-20 DIAGNOSIS — Z510 Encounter for antineoplastic radiation therapy: Secondary | ICD-10-CM | POA: Diagnosis not present

## 2017-12-20 DIAGNOSIS — C3411 Malignant neoplasm of upper lobe, right bronchus or lung: Secondary | ICD-10-CM | POA: Diagnosis not present

## 2017-12-20 DIAGNOSIS — R5383 Other fatigue: Secondary | ICD-10-CM

## 2017-12-20 DIAGNOSIS — E785 Hyperlipidemia, unspecified: Secondary | ICD-10-CM | POA: Diagnosis not present

## 2017-12-20 DIAGNOSIS — E039 Hypothyroidism, unspecified: Secondary | ICD-10-CM

## 2017-12-20 DIAGNOSIS — R531 Weakness: Secondary | ICD-10-CM | POA: Diagnosis not present

## 2017-12-20 DIAGNOSIS — I10 Essential (primary) hypertension: Secondary | ICD-10-CM | POA: Diagnosis not present

## 2017-12-20 DIAGNOSIS — I700 Atherosclerosis of aorta: Secondary | ICD-10-CM

## 2017-12-20 DIAGNOSIS — Z5111 Encounter for antineoplastic chemotherapy: Secondary | ICD-10-CM

## 2017-12-20 DIAGNOSIS — Z85820 Personal history of malignant melanoma of skin: Secondary | ICD-10-CM

## 2017-12-20 DIAGNOSIS — C342 Malignant neoplasm of middle lobe, bronchus or lung: Secondary | ICD-10-CM | POA: Diagnosis not present

## 2017-12-20 DIAGNOSIS — Z79899 Other long term (current) drug therapy: Secondary | ICD-10-CM | POA: Diagnosis not present

## 2017-12-20 LAB — CMP (CANCER CENTER ONLY)
ALT: 9 U/L (ref 0–44)
AST: 12 U/L — ABNORMAL LOW (ref 15–41)
Albumin: 3.2 g/dL — ABNORMAL LOW (ref 3.5–5.0)
Alkaline Phosphatase: 71 U/L (ref 38–126)
Anion gap: 11 (ref 5–15)
BUN: 26 mg/dL — ABNORMAL HIGH (ref 8–23)
CO2: 27 mmol/L (ref 22–32)
Calcium: 10.2 mg/dL (ref 8.9–10.3)
Chloride: 101 mmol/L (ref 98–111)
Creatinine: 1.44 mg/dL — ABNORMAL HIGH (ref 0.44–1.00)
GFR, Est AFR Am: 38 mL/min — ABNORMAL LOW (ref >60)
GFR, Estimated: 33 mL/min — ABNORMAL LOW (ref >60)
Glucose, Bld: 117 mg/dL — ABNORMAL HIGH (ref 70–99)
Potassium: 3.3 mmol/L — ABNORMAL LOW (ref 3.5–5.1)
Sodium: 139 mmol/L (ref 135–145)
Total Bilirubin: 0.6 mg/dL (ref 0.3–1.2)
Total Protein: 7.1 g/dL (ref 6.5–8.1)

## 2017-12-20 LAB — CBC WITH DIFFERENTIAL (CANCER CENTER ONLY)
Basophils Absolute: 0 10*3/uL (ref 0.0–0.1)
Basophils Relative: 1 %
Eosinophils Absolute: 0.1 10*3/uL (ref 0.0–0.5)
Eosinophils Relative: 2 %
HCT: 31 % — ABNORMAL LOW (ref 34.8–46.6)
Hemoglobin: 9.8 g/dL — ABNORMAL LOW (ref 11.6–15.9)
Lymphocytes Relative: 9 %
Lymphs Abs: 0.6 10*3/uL — ABNORMAL LOW (ref 0.9–3.3)
MCH: 26.6 pg (ref 25.1–34.0)
MCHC: 31.6 g/dL (ref 31.5–36.0)
MCV: 84 fL (ref 79.5–101.0)
Monocytes Absolute: 0.3 10*3/uL (ref 0.1–0.9)
Monocytes Relative: 4 %
Neutro Abs: 5.2 10*3/uL (ref 1.5–6.5)
Neutrophils Relative %: 84 %
Platelet Count: 263 10*3/uL (ref 145–400)
RBC: 3.69 MIL/uL — ABNORMAL LOW (ref 3.70–5.45)
RDW: 14.6 % — ABNORMAL HIGH (ref 11.2–14.5)
WBC Count: 6.2 10*3/uL (ref 3.9–10.3)

## 2017-12-20 MED ORDER — PALONOSETRON HCL INJECTION 0.25 MG/5ML
INTRAVENOUS | Status: AC
  Filled 2017-12-20: qty 5

## 2017-12-20 MED ORDER — PALONOSETRON HCL INJECTION 0.25 MG/5ML
0.2500 mg | Freq: Once | INTRAVENOUS | Status: AC
  Administered 2017-12-20: 0.25 mg via INTRAVENOUS

## 2017-12-20 MED ORDER — FAMOTIDINE IN NACL 20-0.9 MG/50ML-% IV SOLN
20.0000 mg | Freq: Once | INTRAVENOUS | Status: AC
  Administered 2017-12-20: 20 mg via INTRAVENOUS

## 2017-12-20 MED ORDER — DIPHENHYDRAMINE HCL 50 MG/ML IJ SOLN
INTRAMUSCULAR | Status: AC
  Filled 2017-12-20: qty 1

## 2017-12-20 MED ORDER — SODIUM CHLORIDE 0.9 % IV SOLN
117.2000 mg | Freq: Once | INTRAVENOUS | Status: AC
  Administered 2017-12-20: 120 mg via INTRAVENOUS
  Filled 2017-12-20: qty 12

## 2017-12-20 MED ORDER — DIPHENHYDRAMINE HCL 50 MG/ML IJ SOLN
25.0000 mg | Freq: Once | INTRAMUSCULAR | Status: AC
  Administered 2017-12-20: 25 mg via INTRAVENOUS

## 2017-12-20 MED ORDER — FAMOTIDINE IN NACL 20-0.9 MG/50ML-% IV SOLN
INTRAVENOUS | Status: AC
  Filled 2017-12-20: qty 50

## 2017-12-20 MED ORDER — INFLUENZA VAC SPLIT HIGH-DOSE 0.5 ML IM SUSY
0.5000 mL | PREFILLED_SYRINGE | Freq: Once | INTRAMUSCULAR | Status: AC
  Administered 2017-12-20: 0.5 mL via INTRAMUSCULAR
  Filled 2017-12-20: qty 0.5

## 2017-12-20 MED ORDER — SODIUM CHLORIDE 0.9 % IV SOLN
20.0000 mg | Freq: Once | INTRAVENOUS | Status: AC
  Administered 2017-12-20: 20 mg via INTRAVENOUS
  Filled 2017-12-20: qty 2

## 2017-12-20 MED ORDER — SODIUM CHLORIDE 0.9 % IV SOLN
Freq: Once | INTRAVENOUS | Status: AC
  Administered 2017-12-20: 12:00:00 via INTRAVENOUS
  Filled 2017-12-20: qty 250

## 2017-12-20 MED ORDER — SODIUM CHLORIDE 0.9 % IV SOLN
45.0000 mg/m2 | Freq: Once | INTRAVENOUS | Status: AC
  Administered 2017-12-20: 84 mg via INTRAVENOUS
  Filled 2017-12-20: qty 14

## 2017-12-20 NOTE — Patient Instructions (Signed)
[facility] Discharge Instructions for Patients Receiving Chemotherapy  Today you received the following chemotherapy agents taxol/carboplatin  To help prevent nausea and vomiting after your treatment, we encourage you to take your nausea medication as directed   If you develop nausea and vomiting that is not controlled by your nausea medication, call the clinic.   BELOW ARE SYMPTOMS THAT SHOULD BE REPORTED IMMEDIATELY:  *FEVER GREATER THAN 100.5 F  *CHILLS WITH OR WITHOUT FEVER  NAUSEA AND VOMITING THAT IS NOT CONTROLLED WITH YOUR NAUSEA MEDICATION  *UNUSUAL SHORTNESS OF BREATH  *UNUSUAL BRUISING OR BLEEDING  TENDERNESS IN MOUTH AND THROAT WITH OR WITHOUT PRESENCE OF ULCERS  *URINARY PROBLEMS  *BOWEL PROBLEMS  UNUSUAL RASH Items with * indicate a potential emergency and should be followed up as soon as possible.  Feel free to call the clinic you have any questions or concerns. The clinic phone number is [phone].  

## 2017-12-20 NOTE — Progress Notes (Signed)
[facility] [phone]   [phone]  OFFICE PROGRESS NOTE  [name], MD [place] [address]  DIAGNOSIS: Stage IIIb (T3, N3, M0) non-small cell lung cancer, squamous cell carcinoma presented with large right middle lobe lung mass in addition to right hilar, subcarinal and right supraclavicular lymphadenopathy diagnosed in August 2019.  PRIOR THERAPY: None.  CURRENT THERAPY: Concurrent chemoradiation with weekly carboplatin for AUC of 2 and paclitaxel 45 NG/M2.  Status post 1 cycle.  INTERVAL HISTORY: Lisa [name] 84 y.o. female returns to the clinic today for follow-up visit accompanied by her granddaughter [name].  The patient is feeling fine today with no concerning complaints.  She tolerated the first week of her concurrent chemoradiation fairly well.  She denied having any chest pain, shortness breath, cough or hemoptysis.  She has no fever or chills.  She denied having any nausea, vomiting, diarrhea or constipation.  She is here for evaluation before starting cycle #2.  MEDICAL HISTORY: Past Medical History:  Diagnosis Date  . Diverticulosis   . Headache   . Hemorrhoids    internal and external  . Hyperlipidemia 01/29/1991  . Hypertension 10/29/1995  . Hypothyroidism 10/29/1995  . Lung mass    right middle lobe  . Melanoma ([place])    h/o, local excision. no chemo or rady.   . Skin cancer (melanoma) ([place])   . Tubular adenoma of colon   . Wears dentures     ALLERGIES:  is allergic to atorvastatin; celebrex [celecoxib]; cholecalciferol; and simvastatin.  MEDICATIONS:  Current Outpatient Medications  Medication Sig Dispense Refill  . amLODipine (NORVASC) 10 MG tablet Take 1 tablet (10 mg total) by mouth daily. 90 tablet 3  . Cholecalciferol (VITAMIN D) 2000 units tablet Take 2,000 Units by mouth 2 (two) times daily.    . Coenzyme Q10 100 MG capsule Take 100 mg by mouth daily.     Marland Kitchen docusate sodium  (COLACE) 100 MG capsule Take 100 mg by mouth daily as needed for mild constipation.    . fluticasone (FLONASE) 50 MCG/ACT nasal spray USE 2 SPRAYS IN EACH NOSTRIL DAILY AS NEEDED FOR RHINITIS 48 g 3  . levothyroxine (SYNTHROID, LEVOTHROID) 75 MCG tablet Take 1 tablet (75 mcg total) by mouth daily. 90 tablet 3  . losartan-hydrochlorothiazide (HYZAAR) 100-25 MG tablet Take 1 tablet by mouth daily. 90 tablet 3  . Melatonin 3 MG TABS Take 1 tablet by mouth at bedtime as needed.    . prochlorperazine (COMPAZINE) 10 MG tablet Take 1 tablet (10 mg total) by mouth every 6 (six) hours as needed for nausea or vomiting. 30 tablet 0  . sennosides-docusate sodium (SENOKOT-S) 8.6-50 MG tablet Take 1 tablet by mouth daily as needed for constipation.     No current facility-administered medications for this visit.     SURGICAL HISTORY:  Past Surgical History:  Procedure Laterality Date  . BRONCHIAL BIOPSY  11/23/2017   Procedure: BRONCHIAL BIOPSIES;  Surgeon: Grace Isaac, MD;  Location: [facility] OR;  Service: Thoracic;;  . cataract surgery  2007, 2008   repair bilat  . DG BARIUM ENEMA ([place] HX) N/A 2016   [name]  . HERNIA REPAIR  04/25/2001  . IR US GUIDE BX ASP/DRAIN  11/17/2017  . KNEE SURGERY     meniscus tear per Dr. Ronnie Derby, R knee  . MULTIPLE TOOTH EXTRACTIONS    . SEPTOPLASTY  2006   Dr. Ernesto Rutherford  .  SKIN CANCER EXCISION    . TONSILLECTOMY  1954  . VAGINAL HYSTERECTOMY    . VIDEO BRONCHOSCOPY WITH ENDOBRONCHIAL ULTRASOUND N/A 11/23/2017   Procedure: VIDEO BRONCHOSCOPY WITH ENDOBRONCHIAL ULTRASOUND;  Surgeon: Grace Isaac, MD;  Location: [facility];  Service: Thoracic;  Laterality: N/A;    REVIEW OF SYSTEMS:  A comprehensive review of systems was negative except for: Constitutional: positive for fatigue   PHYSICAL EXAMINATION: General appearance: alert, cooperative, fatigued and no distress Head: Normocephalic, without obvious abnormality, atraumatic Neck: no adenopathy, no JVD, supple,  symmetrical, trachea midline and thyroid not enlarged, symmetric, no tenderness/mass/nodules Lymph nodes: Cervical, supraclavicular, and axillary nodes normal. Resp: clear to auscultation bilaterally Back: symmetric, no curvature. ROM normal. No CVA tenderness. Cardio: regular rate and rhythm, S1, S2 normal, no murmur, click, rub or gallop GI: soft, non-tender; bowel sounds normal; no masses,  no organomegaly Extremities: extremities normal, atraumatic, no cyanosis or edema  ECOG PERFORMANCE STATUS: 1 - Symptomatic but completely ambulatory  Blood pressure 124/63, pulse (!) 102, temperature 97.8 F (36.6 C), temperature source Oral, resp. rate 18, height 5\' 5"  (1.651 m), weight 177 lb 12.8 oz (80.6 kg), SpO2 97 %.  LABORATORY DATA: Lab Results  Component Value Date   WBC 6.2 12/20/2017   HGB 9.8 (L) 12/20/2017   HCT 31.0 (L) 12/20/2017   MCV 84.0 12/20/2017   PLT 263 12/20/2017      Chemistry      Component Value Date/Time   NA 137 12/13/2017 0857   K 3.4 (L) 12/13/2017 0857   CL 102 12/13/2017 0857   CO2 23 12/13/2017 0857   BUN 20 12/13/2017 0857   CREATININE 1.53 (H) 12/13/2017 0857      Component Value Date/Time   CALCIUM 10.3 12/13/2017 0857   ALKPHOS 71 12/13/2017 0857   AST 15 12/13/2017 0857   ALT 6 12/13/2017 0857   BILITOT 1.1 12/13/2017 0857       RADIOGRAPHIC STUDIES: Dg Chest 2 View  Result Date: 11/23/2017 CLINICAL DATA:  Preop EXAM: CHEST - 2 VIEW COMPARISON:  , CT chest of 10/26/2017 and PET-CT of 11/08/2017 FINDINGS: There is a moderately large rounded mass within the right middle lobe proximally consistent with primary lung carcinoma when compared with prior CT and PET-CT studies. However the remainder of the lungs are clear with only mild linear atelectasis or scarring at the left lung base. Mediastinal hilar contours are unremarkable and the heart is within upper limits of normal. Moderate thoracic aortic atherosclerosis is present. No acute bony  abnormality is seen. IMPRESSION: Large right middle lobe mass consistent with primary lung carcinoma as noted above. No other abnormality is seen. Electronically Signed   By: Ivar Drape M.D.   On: 11/23/2017 12:19   Mr Jeri Cos [employer] Contrast  Result Date: 12/07/2017 CLINICAL DATA:  New diagnosis lung cancer.  Staging. EXAM: MRI HEAD WITHOUT AND WITH CONTRAST TECHNIQUE: Multiplanar, multiecho pulse sequences of the brain and surrounding structures were obtained without and with intravenous contrast. CONTRAST:  8 mL Gadovist IV COMPARISON:  None. FINDINGS: Brain: Mild atrophy. Moderate to extensive chronic white matter changes. No acute infarct. Negative for hemorrhage mass or edema. Normal enhancement following contrast infusion. Negative for metastatic disease. Vascular: Normal arterial flow voids Skull and upper cervical spine: Negative Sinuses/Orbits: Mild mucosal edema paranasal sinuses. Bilateral cataract surgery. Other: None IMPRESSION: Negative for metastatic disease No acute abnormality. Atrophy and moderate to advanced chronic microvascular ischemic change in the white matter. Electronically Signed  By: Franchot Gallo M.D.   On: 12/07/2017 15:51    ASSESSMENT AND PLAN: This is a very pleasant 84 years old white female with stage IIIb non-small cell lung cancer, squamous cell carcinoma.  She is currently undergoing a course of concurrent chemoradiation with weekly carboplatin and paclitaxel status post 1 cycle.  She tolerated the first cycle of her treatment well. I recommended for the patient to proceed with cycle #2 today as a schedule. I will see her back for follow-up visit in 2 weeks for evaluation before starting cycle #4. She was advised to call immediately if she has any concerning symptoms in the interval. The patient voices understanding of current disease status and treatment options and is in agreement with the current care plan.  All questions were answered. The patient knows to call  the clinic with any problems, questions or concerns. We can certainly see the patient much sooner if necessary.  I spent 10 minutes counseling the patient face to face. The total time spent in the appointment was 15 minutes.  Disclaimer: This note was dictated with voice recognition software. Similar sounding words can inadvertently be transcribed and may not be corrected upon review.

## 2017-12-20 NOTE — Progress Notes (Signed)
Oncology Nurse Navigator Documentation  Oncology Nurse Navigator Flowsheets 12/20/2017  Navigator Location [facility]  Navigator Encounter Type Clinic/MDC/I spoke with patient and granddaughter today at clinic. I helped to explain treatment appt schedule.  I spoke to patient about referral to dietitian to help with good nutritional support through treatment.  She is ok with referral.  I will complete referral.   Treatment Initiated Date 12/13/2017  Patient Visit Type MedOnc  Treatment Phase Treatment  Barriers/Navigation Needs Education;Coordination of Care  Education Other  Interventions Coordination of Care;Education  Coordination of Care Other  Education Method Verbal  Acuity Level 2  Time Spent with Patient 30

## 2017-12-20 NOTE — Telephone Encounter (Signed)
Per 9/23 patient was already scheduled by MX

## 2017-12-20 NOTE — Telephone Encounter (Signed)
Please call patient and get the appointment scheduled per Dr. Josefine Class note.

## 2017-12-21 ENCOUNTER — Ambulatory Visit
Admission: RE | Admit: 2017-12-21 | Discharge: 2017-12-21 | Disposition: A | Discharge status: Home / Self Care | Payer: Medicare Other | Source: Ambulatory Visit | Attending: Radiation Oncology | Admitting: Radiation Oncology

## 2017-12-21 DIAGNOSIS — C3491 Malignant neoplasm of unspecified part of right bronchus or lung: Secondary | ICD-10-CM | POA: Diagnosis not present

## 2017-12-21 DIAGNOSIS — Z510 Encounter for antineoplastic radiation therapy: Secondary | ICD-10-CM | POA: Diagnosis not present

## 2017-12-21 DIAGNOSIS — C342 Malignant neoplasm of middle lobe, bronchus or lung: Secondary | ICD-10-CM | POA: Diagnosis not present

## 2017-12-21 NOTE — Telephone Encounter (Signed)
Called patient but it kept ringing and could not leave a message. Will try again later-Myshawn Chiriboga Estell Harpin, RMA

## 2017-12-22 ENCOUNTER — Ambulatory Visit
Admission: RE | Admit: 2017-12-22 | Discharge: 2017-12-22 | Disposition: A | Discharge status: Home / Self Care | Payer: Medicare Other | Source: Ambulatory Visit | Attending: Radiation Oncology | Admitting: Radiation Oncology

## 2017-12-22 DIAGNOSIS — C3491 Malignant neoplasm of unspecified part of right bronchus or lung: Secondary | ICD-10-CM | POA: Diagnosis not present

## 2017-12-22 DIAGNOSIS — Z510 Encounter for antineoplastic radiation therapy: Secondary | ICD-10-CM | POA: Diagnosis not present

## 2017-12-22 DIAGNOSIS — C342 Malignant neoplasm of middle lobe, bronchus or lung: Secondary | ICD-10-CM | POA: Diagnosis not present

## 2017-12-22 LAB — FUNGUS CULTURE WITH STAIN

## 2017-12-22 LAB — FUNGUS CULTURE RESULT

## 2017-12-22 LAB — FUNGAL ORGANISM REFLEX

## 2017-12-22 NOTE — Telephone Encounter (Signed)
Left message for pt to  Call back and schedule flu clinic-Jerri Hargadon V Kimorah Ridolfi, RMA

## 2017-12-23 ENCOUNTER — Ambulatory Visit
Admission: RE | Admit: 2017-12-23 | Discharge: 2017-12-23 | Disposition: A | Discharge status: Home / Self Care | Payer: Medicare Other | Source: Ambulatory Visit | Attending: Radiation Oncology | Admitting: Radiation Oncology

## 2017-12-23 DIAGNOSIS — Z510 Encounter for antineoplastic radiation therapy: Secondary | ICD-10-CM | POA: Diagnosis not present

## 2017-12-23 DIAGNOSIS — C342 Malignant neoplasm of middle lobe, bronchus or lung: Secondary | ICD-10-CM | POA: Diagnosis not present

## 2017-12-23 DIAGNOSIS — C3491 Malignant neoplasm of unspecified part of right bronchus or lung: Secondary | ICD-10-CM | POA: Diagnosis not present

## 2017-12-24 ENCOUNTER — Ambulatory Visit
Admission: RE | Admit: 2017-12-24 | Discharge: 2017-12-24 | Disposition: A | Discharge status: Home / Self Care | Payer: Medicare Other | Source: Ambulatory Visit | Attending: Radiation Oncology | Admitting: Radiation Oncology

## 2017-12-24 ENCOUNTER — Telehealth: Payer: Self-pay

## 2017-12-24 DIAGNOSIS — Z510 Encounter for antineoplastic radiation therapy: Secondary | ICD-10-CM | POA: Diagnosis not present

## 2017-12-24 DIAGNOSIS — C3491 Malignant neoplasm of unspecified part of right bronchus or lung: Secondary | ICD-10-CM | POA: Diagnosis not present

## 2017-12-24 DIAGNOSIS — C342 Malignant neoplasm of middle lobe, bronchus or lung: Secondary | ICD-10-CM | POA: Diagnosis not present

## 2017-12-24 NOTE — Telephone Encounter (Signed)
See 12/19/17 note. Per immunization record pt had high dose flu shot on 12/20/17. FYI to Dr Damita Dunnings.

## 2017-12-24 NOTE — Telephone Encounter (Signed)
Noted. Thanks.

## 2017-12-24 NOTE — Telephone Encounter (Signed)
Copied from [place] [id number]. Topic: Quick Communication - Office Called Patient >> Dec 21, 2017 11:16 AM Tiffany Kocher V wrote: Reason for CRM: called patient to schedule her for Flu shot on flu clinic schedule. Was not able to leave a message for the [employer], RMA >> Dec 24, 2017 11:25 AM Reyne Dumas L wrote: Pt states she had a flu shot at the [facility] last Monday.

## 2017-12-27 ENCOUNTER — Ambulatory Visit
Admission: RE | Admit: 2017-12-27 | Discharge: 2017-12-27 | Disposition: A | Discharge status: Home / Self Care | Payer: Medicare Other | Source: Ambulatory Visit | Attending: Radiation Oncology | Admitting: Radiation Oncology

## 2017-12-27 ENCOUNTER — Inpatient Hospital Stay: Payer: Medicare Other

## 2017-12-27 VITALS — BP 118/66 | HR 87 | Temp 98.3°F | Resp 18 | Wt 176.5 lb

## 2017-12-27 DIAGNOSIS — R531 Weakness: Secondary | ICD-10-CM | POA: Diagnosis not present

## 2017-12-27 DIAGNOSIS — C3491 Malignant neoplasm of unspecified part of right bronchus or lung: Secondary | ICD-10-CM

## 2017-12-27 DIAGNOSIS — Z79899 Other long term (current) drug therapy: Secondary | ICD-10-CM | POA: Diagnosis not present

## 2017-12-27 DIAGNOSIS — Z5111 Encounter for antineoplastic chemotherapy: Secondary | ICD-10-CM | POA: Diagnosis not present

## 2017-12-27 DIAGNOSIS — Z800 Family history of malignant neoplasm of digestive organs: Secondary | ICD-10-CM | POA: Diagnosis not present

## 2017-12-27 DIAGNOSIS — E039 Hypothyroidism, unspecified: Secondary | ICD-10-CM | POA: Diagnosis not present

## 2017-12-27 DIAGNOSIS — Z87891 Personal history of nicotine dependence: Secondary | ICD-10-CM | POA: Diagnosis not present

## 2017-12-27 DIAGNOSIS — Z85820 Personal history of malignant melanoma of skin: Secondary | ICD-10-CM | POA: Diagnosis not present

## 2017-12-27 DIAGNOSIS — I700 Atherosclerosis of aorta: Secondary | ICD-10-CM | POA: Diagnosis not present

## 2017-12-27 DIAGNOSIS — C3411 Malignant neoplasm of upper lobe, right bronchus or lung: Secondary | ICD-10-CM | POA: Diagnosis not present

## 2017-12-27 DIAGNOSIS — R42 Dizziness and giddiness: Secondary | ICD-10-CM | POA: Diagnosis not present

## 2017-12-27 DIAGNOSIS — R590 Localized enlarged lymph nodes: Secondary | ICD-10-CM | POA: Diagnosis not present

## 2017-12-27 DIAGNOSIS — C342 Malignant neoplasm of middle lobe, bronchus or lung: Secondary | ICD-10-CM | POA: Diagnosis not present

## 2017-12-27 DIAGNOSIS — Z510 Encounter for antineoplastic radiation therapy: Secondary | ICD-10-CM | POA: Diagnosis not present

## 2017-12-27 DIAGNOSIS — E785 Hyperlipidemia, unspecified: Secondary | ICD-10-CM | POA: Diagnosis not present

## 2017-12-27 DIAGNOSIS — R5383 Other fatigue: Secondary | ICD-10-CM | POA: Diagnosis not present

## 2017-12-27 DIAGNOSIS — Z23 Encounter for immunization: Secondary | ICD-10-CM | POA: Diagnosis not present

## 2017-12-27 DIAGNOSIS — I10 Essential (primary) hypertension: Secondary | ICD-10-CM | POA: Diagnosis not present

## 2017-12-27 LAB — CMP (CANCER CENTER ONLY)
ALT: 9 U/L (ref 0–44)
AST: 13 U/L — ABNORMAL LOW (ref 15–41)
Albumin: 3.1 g/dL — ABNORMAL LOW (ref 3.5–5.0)
Alkaline Phosphatase: 62 U/L (ref 38–126)
Anion gap: 10 (ref 5–15)
BUN: 22 mg/dL (ref 8–23)
CO2: 26 mmol/L (ref 22–32)
Calcium: 10 mg/dL (ref 8.9–10.3)
Chloride: 103 mmol/L (ref 98–111)
Creatinine: 1.46 mg/dL — ABNORMAL HIGH (ref 0.44–1.00)
GFR, Est AFR Am: 37 mL/min — ABNORMAL LOW (ref >60)
GFR, Estimated: 32 mL/min — ABNORMAL LOW (ref >60)
Glucose, Bld: 89 mg/dL (ref 70–99)
Potassium: 3.3 mmol/L — ABNORMAL LOW (ref 3.5–5.1)
Sodium: 139 mmol/L (ref 135–145)
Total Bilirubin: 0.5 mg/dL (ref 0.3–1.2)
Total Protein: 6.7 g/dL (ref 6.5–8.1)

## 2017-12-27 LAB — CBC WITH DIFFERENTIAL (CANCER CENTER ONLY)
Basophils Absolute: 0 10*3/uL (ref 0.0–0.1)
Basophils Relative: 1 %
Eosinophils Absolute: 0.1 10*3/uL (ref 0.0–0.5)
Eosinophils Relative: 2 %
HCT: 30 % — ABNORMAL LOW (ref 34.8–46.6)
Hemoglobin: 9.8 g/dL — ABNORMAL LOW (ref 11.6–15.9)
Lymphocytes Relative: 13 %
Lymphs Abs: 0.4 10*3/uL — ABNORMAL LOW (ref 0.9–3.3)
MCH: 27.1 pg (ref 25.1–34.0)
MCHC: 32.6 g/dL (ref 31.5–36.0)
MCV: 83.1 fL (ref 79.5–101.0)
Monocytes Absolute: 0.2 10*3/uL (ref 0.1–0.9)
Monocytes Relative: 8 %
Neutro Abs: 2.3 10*3/uL (ref 1.5–6.5)
Neutrophils Relative %: 76 %
Platelet Count: 253 10*3/uL (ref 145–400)
RBC: 3.61 MIL/uL — ABNORMAL LOW (ref 3.70–5.45)
RDW: 15.6 % — ABNORMAL HIGH (ref 11.2–14.5)
WBC Count: 3 10*3/uL — ABNORMAL LOW (ref 3.9–10.3)

## 2017-12-27 LAB — RESEARCH LABS

## 2017-12-27 MED ORDER — FAMOTIDINE IN NACL 20-0.9 MG/50ML-% IV SOLN
20.0000 mg | Freq: Once | INTRAVENOUS | Status: AC
  Administered 2017-12-27: 20 mg via INTRAVENOUS

## 2017-12-27 MED ORDER — SODIUM CHLORIDE 0.9 % IV SOLN
117.2000 mg | Freq: Once | INTRAVENOUS | Status: AC
  Administered 2017-12-27: 120 mg via INTRAVENOUS
  Filled 2017-12-27: qty 12

## 2017-12-27 MED ORDER — SODIUM CHLORIDE 0.9 % IV SOLN
45.0000 mg/m2 | Freq: Once | INTRAVENOUS | Status: AC
  Administered 2017-12-27: 84 mg via INTRAVENOUS
  Filled 2017-12-27: qty 14

## 2017-12-27 MED ORDER — DIPHENHYDRAMINE HCL 50 MG/ML IJ SOLN
INTRAMUSCULAR | Status: AC
  Filled 2017-12-27: qty 1

## 2017-12-27 MED ORDER — FAMOTIDINE IN NACL 20-0.9 MG/50ML-% IV SOLN
INTRAVENOUS | Status: AC
  Filled 2017-12-27: qty 50

## 2017-12-27 MED ORDER — PALONOSETRON HCL INJECTION 0.25 MG/5ML
0.2500 mg | Freq: Once | INTRAVENOUS | Status: AC
  Administered 2017-12-27: 0.25 mg via INTRAVENOUS

## 2017-12-27 MED ORDER — DIPHENHYDRAMINE HCL 50 MG/ML IJ SOLN
25.0000 mg | Freq: Once | INTRAMUSCULAR | Status: AC
  Administered 2017-12-27: 25 mg via INTRAVENOUS

## 2017-12-27 MED ORDER — SODIUM CHLORIDE 0.9 % IV SOLN
Freq: Once | INTRAVENOUS | Status: AC
  Administered 2017-12-27: 12:00:00 via INTRAVENOUS
  Filled 2017-12-27: qty 250

## 2017-12-27 MED ORDER — SODIUM CHLORIDE 0.9 % IV SOLN
20.0000 mg | Freq: Once | INTRAVENOUS | Status: AC
  Administered 2017-12-27: 20 mg via INTRAVENOUS
  Filled 2017-12-27: qty 2

## 2017-12-27 MED ORDER — PALONOSETRON HCL INJECTION 0.25 MG/5ML
INTRAVENOUS | Status: AC
  Filled 2017-12-27: qty 5

## 2017-12-27 NOTE — Telephone Encounter (Signed)
Left message with details to call us back to schedule at her convenience V Danisha Brassfield, RMA

## 2017-12-27 NOTE — Patient Instructions (Signed)
[facility] Discharge Instructions for Patients Receiving Chemotherapy  Today you received the following chemotherapy agents Taxol, Carboplatin  To help prevent nausea and vomiting after your treatment, we encourage you to take your nausea medication as directed  If you develop nausea and vomiting that is not controlled by your nausea medication, call the clinic.   BELOW ARE SYMPTOMS THAT SHOULD BE REPORTED IMMEDIATELY:  *FEVER GREATER THAN 100.5 F  *CHILLS WITH OR WITHOUT FEVER  NAUSEA AND VOMITING THAT IS NOT CONTROLLED WITH YOUR NAUSEA MEDICATION  *UNUSUAL SHORTNESS OF BREATH  *UNUSUAL BRUISING OR BLEEDING  TENDERNESS IN MOUTH AND THROAT WITH OR WITHOUT PRESENCE OF ULCERS  *URINARY PROBLEMS  *BOWEL PROBLEMS  UNUSUAL RASH Items with * indicate a potential emergency and should be followed up as soon as possible.  Feel free to call the clinic should you have any questions or concerns. The clinic phone number is [phone].  Please show the CHEMO ALERT CARD at check-in to the Emergency Department and triage nurse.   

## 2017-12-28 ENCOUNTER — Other Ambulatory Visit: Payer: Self-pay | Admitting: Radiation Oncology

## 2017-12-28 ENCOUNTER — Ambulatory Visit
Admission: RE | Admit: 2017-12-28 | Discharge: 2017-12-28 | Disposition: A | Discharge status: Home / Self Care | Payer: Medicare Other | Source: Ambulatory Visit | Attending: Radiation Oncology | Admitting: Radiation Oncology

## 2017-12-28 DIAGNOSIS — Z510 Encounter for antineoplastic radiation therapy: Secondary | ICD-10-CM | POA: Insufficient documentation

## 2017-12-28 DIAGNOSIS — C3491 Malignant neoplasm of unspecified part of right bronchus or lung: Secondary | ICD-10-CM | POA: Diagnosis not present

## 2017-12-28 DIAGNOSIS — C342 Malignant neoplasm of middle lobe, bronchus or lung: Secondary | ICD-10-CM | POA: Diagnosis not present

## 2017-12-28 MED ORDER — SUCRALFATE 1 G PO TABS: 1.0000 g | ORAL_TABLET | Freq: Three times a day (TID) | ORAL | 2 refills | Status: DC

## 2017-12-29 ENCOUNTER — Ambulatory Visit
Admission: RE | Admit: 2017-12-29 | Discharge: 2017-12-29 | Disposition: A | Discharge status: Home / Self Care | Payer: Medicare Other | Source: Ambulatory Visit | Attending: Radiation Oncology | Admitting: Radiation Oncology

## 2017-12-29 DIAGNOSIS — C342 Malignant neoplasm of middle lobe, bronchus or lung: Secondary | ICD-10-CM | POA: Diagnosis not present

## 2017-12-29 DIAGNOSIS — C3491 Malignant neoplasm of unspecified part of right bronchus or lung: Secondary | ICD-10-CM | POA: Diagnosis not present

## 2017-12-29 DIAGNOSIS — Z510 Encounter for antineoplastic radiation therapy: Secondary | ICD-10-CM | POA: Diagnosis not present

## 2017-12-30 ENCOUNTER — Ambulatory Visit
Admission: RE | Admit: 2017-12-30 | Discharge: 2017-12-30 | Disposition: A | Discharge status: Home / Self Care | Payer: Medicare Other | Source: Ambulatory Visit | Attending: Radiation Oncology | Admitting: Radiation Oncology

## 2017-12-30 DIAGNOSIS — Z510 Encounter for antineoplastic radiation therapy: Secondary | ICD-10-CM | POA: Diagnosis not present

## 2017-12-30 DIAGNOSIS — C3491 Malignant neoplasm of unspecified part of right bronchus or lung: Secondary | ICD-10-CM | POA: Diagnosis not present

## 2017-12-30 DIAGNOSIS — C342 Malignant neoplasm of middle lobe, bronchus or lung: Secondary | ICD-10-CM | POA: Diagnosis not present

## 2017-12-31 ENCOUNTER — Ambulatory Visit
Admission: RE | Admit: 2017-12-31 | Discharge: 2017-12-31 | Disposition: A | Discharge status: Home / Self Care | Payer: Medicare Other | Source: Ambulatory Visit | Attending: Radiation Oncology | Admitting: Radiation Oncology

## 2017-12-31 DIAGNOSIS — C3491 Malignant neoplasm of unspecified part of right bronchus or lung: Secondary | ICD-10-CM | POA: Diagnosis not present

## 2017-12-31 DIAGNOSIS — Z510 Encounter for antineoplastic radiation therapy: Secondary | ICD-10-CM | POA: Diagnosis not present

## 2017-12-31 DIAGNOSIS — C342 Malignant neoplasm of middle lobe, bronchus or lung: Secondary | ICD-10-CM | POA: Diagnosis not present

## 2018-01-03 ENCOUNTER — Ambulatory Visit
Admission: RE | Admit: 2018-01-03 | Discharge: 2018-01-03 | Disposition: A | Discharge status: Home / Self Care | Payer: Medicare Other | Source: Ambulatory Visit | Attending: Radiation Oncology | Admitting: Radiation Oncology

## 2018-01-03 ENCOUNTER — Inpatient Hospital Stay: Payer: Medicare Other | Admitting: Nutrition

## 2018-01-03 ENCOUNTER — Telehealth: Payer: Self-pay | Admitting: Oncology

## 2018-01-03 ENCOUNTER — Encounter: Payer: Self-pay | Admitting: Oncology

## 2018-01-03 ENCOUNTER — Inpatient Hospital Stay: Payer: Medicare Other

## 2018-01-03 ENCOUNTER — Other Ambulatory Visit: Payer: Self-pay

## 2018-01-03 ENCOUNTER — Inpatient Hospital Stay (HOSPITAL_BASED_OUTPATIENT_CLINIC_OR_DEPARTMENT_OTHER): Payer: Medicare Other | Admitting: Oncology

## 2018-01-03 ENCOUNTER — Inpatient Hospital Stay: Payer: Medicare Other | Attending: Internal Medicine

## 2018-01-03 VITALS — BP 97/60 | HR 104 | Temp 97.7°F | Resp 14 | Ht 65.0 in | Wt 176.8 lb

## 2018-01-03 DIAGNOSIS — C3491 Malignant neoplasm of unspecified part of right bronchus or lung: Secondary | ICD-10-CM

## 2018-01-03 DIAGNOSIS — K5900 Constipation, unspecified: Secondary | ICD-10-CM | POA: Insufficient documentation

## 2018-01-03 DIAGNOSIS — E039 Hypothyroidism, unspecified: Secondary | ICD-10-CM | POA: Diagnosis not present

## 2018-01-03 DIAGNOSIS — Z79899 Other long term (current) drug therapy: Secondary | ICD-10-CM | POA: Diagnosis not present

## 2018-01-03 DIAGNOSIS — E785 Hyperlipidemia, unspecified: Secondary | ICD-10-CM | POA: Insufficient documentation

## 2018-01-03 DIAGNOSIS — C342 Malignant neoplasm of middle lobe, bronchus or lung: Secondary | ICD-10-CM | POA: Diagnosis not present

## 2018-01-03 DIAGNOSIS — R070 Pain in throat: Secondary | ICD-10-CM | POA: Diagnosis not present

## 2018-01-03 DIAGNOSIS — R630 Anorexia: Secondary | ICD-10-CM

## 2018-01-03 DIAGNOSIS — I10 Essential (primary) hypertension: Secondary | ICD-10-CM | POA: Diagnosis not present

## 2018-01-03 DIAGNOSIS — E860 Dehydration: Secondary | ICD-10-CM

## 2018-01-03 DIAGNOSIS — Z5111 Encounter for antineoplastic chemotherapy: Secondary | ICD-10-CM

## 2018-01-03 DIAGNOSIS — L589 Radiodermatitis, unspecified: Secondary | ICD-10-CM | POA: Insufficient documentation

## 2018-01-03 DIAGNOSIS — Z85820 Personal history of malignant melanoma of skin: Secondary | ICD-10-CM | POA: Insufficient documentation

## 2018-01-03 DIAGNOSIS — Z510 Encounter for antineoplastic radiation therapy: Secondary | ICD-10-CM | POA: Diagnosis not present

## 2018-01-03 LAB — CBC WITH DIFFERENTIAL (CANCER CENTER ONLY)
Basophils Absolute: 0 10*3/uL (ref 0.0–0.1)
Basophils Relative: 1 %
Eosinophils Absolute: 0 10*3/uL (ref 0.0–0.5)
Eosinophils Relative: 1 %
HCT: 29.3 % — ABNORMAL LOW (ref 34.8–46.6)
Hemoglobin: 9.6 g/dL — ABNORMAL LOW (ref 11.6–15.9)
Lymphocytes Relative: 13 %
Lymphs Abs: 0.5 10*3/uL — ABNORMAL LOW (ref 0.9–3.3)
MCH: 27.1 pg (ref 25.1–34.0)
MCHC: 32.9 g/dL (ref 31.5–36.0)
MCV: 82.5 fL (ref 79.5–101.0)
Monocytes Absolute: 0.2 10*3/uL (ref 0.1–0.9)
Monocytes Relative: 6 %
Neutro Abs: 2.9 10*3/uL (ref 1.5–6.5)
Neutrophils Relative %: 79 %
Platelet Count: 171 10*3/uL (ref 145–400)
RBC: 3.55 MIL/uL — ABNORMAL LOW (ref 3.70–5.45)
RDW: 16.4 % — ABNORMAL HIGH (ref 11.2–14.5)
WBC Count: 3.6 10*3/uL — ABNORMAL LOW (ref 3.9–10.3)

## 2018-01-03 LAB — CMP (CANCER CENTER ONLY)
ALT: 9 U/L (ref 0–44)
AST: 14 U/L — ABNORMAL LOW (ref 15–41)
Albumin: 3.3 g/dL — ABNORMAL LOW (ref 3.5–5.0)
Alkaline Phosphatase: 66 U/L (ref 38–126)
Anion gap: 11 (ref 5–15)
BUN: 25 mg/dL — ABNORMAL HIGH (ref 8–23)
CO2: 25 mmol/L (ref 22–32)
Calcium: 9.9 mg/dL (ref 8.9–10.3)
Chloride: 102 mmol/L (ref 98–111)
Creatinine: 1.52 mg/dL — ABNORMAL HIGH (ref 0.44–1.00)
GFR, Est AFR Am: 35 mL/min — ABNORMAL LOW (ref >60)
GFR, Estimated: 31 mL/min — ABNORMAL LOW (ref >60)
Glucose, Bld: 83 mg/dL (ref 70–99)
Potassium: 3.2 mmol/L — ABNORMAL LOW (ref 3.5–5.1)
Sodium: 138 mmol/L (ref 135–145)
Total Bilirubin: 0.7 mg/dL (ref 0.3–1.2)
Total Protein: 7 g/dL (ref 6.5–8.1)

## 2018-01-03 MED ORDER — FAMOTIDINE IN NACL 20-0.9 MG/50ML-% IV SOLN
20.0000 mg | Freq: Once | INTRAVENOUS | Status: AC
  Administered 2018-01-03: 20 mg via INTRAVENOUS

## 2018-01-03 MED ORDER — PALONOSETRON HCL INJECTION 0.25 MG/5ML
0.2500 mg | Freq: Once | INTRAVENOUS | Status: AC
  Administered 2018-01-03: 0.25 mg via INTRAVENOUS

## 2018-01-03 MED ORDER — DIPHENHYDRAMINE HCL 50 MG/ML IJ SOLN
25.0000 mg | Freq: Once | INTRAMUSCULAR | Status: AC
  Administered 2018-01-03: 25 mg via INTRAVENOUS

## 2018-01-03 MED ORDER — SODIUM CHLORIDE 0.9 % IV SOLN
INTRAVENOUS | Status: DC
  Administered 2018-01-03: 10:00:00 via INTRAVENOUS
  Filled 2018-01-03 (×2): qty 250

## 2018-01-03 MED ORDER — DIPHENHYDRAMINE HCL 50 MG/ML IJ SOLN
INTRAMUSCULAR | Status: AC
  Filled 2018-01-03: qty 1

## 2018-01-03 MED ORDER — SODIUM CHLORIDE 0.9 % IV SOLN
45.0000 mg/m2 | Freq: Once | INTRAVENOUS | Status: AC
  Administered 2018-01-03: 84 mg via INTRAVENOUS
  Filled 2018-01-03: qty 14

## 2018-01-03 MED ORDER — FAMOTIDINE IN NACL 20-0.9 MG/50ML-% IV SOLN
INTRAVENOUS | Status: AC
  Filled 2018-01-03: qty 50

## 2018-01-03 MED ORDER — PALONOSETRON HCL INJECTION 0.25 MG/5ML
INTRAVENOUS | Status: AC
  Filled 2018-01-03: qty 5

## 2018-01-03 MED ORDER — SODIUM CHLORIDE 0.9 % IV SOLN
117.2000 mg | Freq: Once | INTRAVENOUS | Status: AC
  Administered 2018-01-03: 120 mg via INTRAVENOUS
  Filled 2018-01-03: qty 12

## 2018-01-03 MED ORDER — SODIUM CHLORIDE 0.9 % IV SOLN
20.0000 mg | Freq: Once | INTRAVENOUS | Status: AC
  Administered 2018-01-03: 20 mg via INTRAVENOUS
  Filled 2018-01-03: qty 2

## 2018-01-03 NOTE — Progress Notes (Signed)
84 year old female diagnosed with non-small cell lung cancer.  She is receiving concurrent chemoradiation therapy. She is a patient of Dr. Julien Nordmann.  Past medical history includes melanoma, hypothyroidism, hypertension, hyperlipidemia, and diverticulosis.  Medications include vitamin D, co-Q10, Colace, Synthroid, Compazine, and Senokot.  Labs were reviewed.  Height: 5 feet 5 inches. Weight: 176.8 pounds on October 7. Usual body weight: 200 pounds in March 2019. BMI: 29.42.  Patient reports she has decreased appetite but tries to eat anyway. She eats small amounts. She complains of constipation which has not yet resolved with stool softener. She drinks Ensure but does not know what kind she drinks. Patient is poor poor historian for specific food she is eating and drinking.  Nutrition diagnosis: Unintended weight loss related to non-small cell lung cancer and associated treatments as evidenced by 12% weight loss over 7 months.  Intervention: I educated patient to increase calories and protein consuming high-calorie high-protein foods. Provided fact sheets and reviewed high-protein foods. Recommended patient switch to Ensure Enlive providing 350 cal and 20 g protein per bottle. Provided coupons for patient. Provided education on the importance of a bowel regimen to avoid further constipation. Encouraged her to continue strategies and contact MD as needed for change in medication. Encouraged increased fluid intake. Questions were answered.  Teach back method used.  Contact information given.  Monitoring, evaluation, goals: Patient will tolerate increased calories and protein to minimize weight loss throughout treatment.  Next visit: Monday, October 21 during infusion.  **Disclaimer: This note was dictated with voice recognition software. Similar sounding words can inadvertently be transcribed and this note may contain transcription errors which may not have been corrected upon  publication of note.**

## 2018-01-03 NOTE — Progress Notes (Signed)
Per Altamese Dilling, NP / Dr Julien Nordmann OK for treatment with CRT of 1.52

## 2018-01-03 NOTE — Patient Instructions (Signed)
[facility] Discharge Instructions for Patients Receiving Chemotherapy  Today you received the following chemotherapy agents:  Taxol, Carboplatin  To help prevent nausea and vomiting after your treatment, we encourage you to take your nausea medication as prescribed.   If you develop nausea and vomiting that is not controlled by your nausea medication, call the clinic.   BELOW ARE SYMPTOMS THAT SHOULD BE REPORTED IMMEDIATELY:  *FEVER GREATER THAN 100.5 F  *CHILLS WITH OR WITHOUT FEVER  NAUSEA AND VOMITING THAT IS NOT CONTROLLED WITH YOUR NAUSEA MEDICATION  *UNUSUAL SHORTNESS OF BREATH  *UNUSUAL BRUISING OR BLEEDING  TENDERNESS IN MOUTH AND THROAT WITH OR WITHOUT PRESENCE OF ULCERS  *URINARY PROBLEMS  *BOWEL PROBLEMS  UNUSUAL RASH Items with * indicate a potential emergency and should be followed up as soon as possible.  Feel free to call the clinic should you have any questions or concerns. The clinic phone number is [phone].  Please show the CHEMO ALERT CARD at check-in to the Emergency Department and triage nurse.   

## 2018-01-03 NOTE — Progress Notes (Signed)
[name] OFFICE PROGRESS NOTE  Lisa Ghent, MD [place] [address]  DIAGNOSIS: Stage IIIb (T3, N3, M0) non-small cell lung cancer, squamous cell carcinoma presented with large right middle lobe lung mass in addition to right hilar, subcarinal and right supraclavicular lymphadenopathy diagnosed in August 2019.  PRIOR THERAPY: None.  CURRENT THERAPY: Concurrent chemoradiation with weekly carboplatin for AUC of 2 and paclitaxel 45 MG/M2.    First dose given on 12/13/2017.  Status post 3 cycles.  INTERVAL HISTORY: Lisa [name] 84 y.o. female returns for routine follow-up visit accompanied by her neighbor.  The patient has no specific complaints today except for decreased appetite, constipation, and mild odynophagia.  She reports that she has not taken any for constipation yet but does have Senokot as on her medication list.  She reports that she was given a prescription for Carafate but has not picked it up from the pharmacy yet.  The patient denies fevers and chills.  Denies chest pain, shortness of breath, cough, hemoptysis.  Denies nausea, vomiting, diarrhea.  Denies recent weight loss despite the fact that her appetite has been decreased and denies night sweats.  The patient is here for evaluation prior to cycle #4 of her chemotherapy.  MEDICAL HISTORY: Past Medical History:  Diagnosis Date  . Diverticulosis   . Headache   . Hemorrhoids    internal and external  . Hyperlipidemia 01/29/1991  . Hypertension 10/29/1995  . Hypothyroidism 10/29/1995  . Lung mass    right middle lobe  . Melanoma ([place])    h/o, local excision. no chemo or rady.   . Skin cancer (melanoma) ([place])   . Tubular adenoma of colon   . Wears dentures     ALLERGIES:  is allergic to atorvastatin; celebrex [celecoxib]; cholecalciferol; and simvastatin.  MEDICATIONS:  Current Outpatient Medications  Medication Sig Dispense Refill  . amLODipine (NORVASC) 10 MG tablet  Take 1 tablet (10 mg total) by mouth daily. 90 tablet 3  . Cholecalciferol (VITAMIN D) 2000 units tablet Take 2,000 Units by mouth 2 (two) times daily.    . Coenzyme Q10 100 MG capsule Take 100 mg by mouth daily.     Marland Kitchen docusate sodium (COLACE) 100 MG capsule Take 100 mg by mouth daily as needed for mild constipation.    . fluticasone (FLONASE) 50 MCG/ACT nasal spray USE 2 SPRAYS IN EACH NOSTRIL DAILY AS NEEDED FOR RHINITIS 48 g 3  . levothyroxine (SYNTHROID, LEVOTHROID) 75 MCG tablet Take 1 tablet (75 mcg total) by mouth daily. 90 tablet 3  . losartan-hydrochlorothiazide (HYZAAR) 100-25 MG tablet Take 1 tablet by mouth daily. 90 tablet 3  . Melatonin 3 MG TABS Take 1 tablet by mouth at bedtime as needed.    . sucralfate (CARAFATE) 1 g tablet Take 1 tablet (1 g total) by mouth 4 (four) times daily -  with meals and at bedtime. Crush and dissolve in 1 tablespoon of water prior to swallowing 30 tablet 2  . prochlorperazine (COMPAZINE) 10 MG tablet Take 1 tablet (10 mg total) by mouth every 6 (six) hours as needed for nausea or vomiting. (Patient not taking: Reported on 01/03/2018) 30 tablet 0  . sennosides-docusate sodium (SENOKOT-S) 8.6-50 MG tablet Take 1 tablet by mouth daily as needed for constipation.     No current facility-administered medications for this visit.    Facility-Administered Medications Ordered in Other Visits  Medication Dose Route Frequency Provider Last Rate Last Dose  .  0.9 %  sodium chloride infusion   Intravenous Continuous Mikey Bussing R, NP 500 mL/hr at 01/03/18 0947    . CARBOplatin (PARAPLATIN) 120 mg in sodium chloride 0.9 % 100 mL chemo infusion  120 mg Intravenous Once Curt Bears, MD      . dexamethasone (DECADRON) 20 mg in sodium chloride 0.9 % 50 mL IVPB  20 mg Intravenous Once Curt Bears, MD      . famotidine (PEPCID) IVPB 20 mg premix  20 mg Intravenous Once Curt Bears, MD 200 mL/hr at 01/03/18 1012 20 mg at 01/03/18 1012  . PACLitaxel (TAXOL)  84 mg in sodium chloride 0.9 % 250 mL chemo infusion (</= 80mg /m2)  45 mg/m2 (Treatment Plan Recorded) Intravenous Once Curt Bears, MD        SURGICAL HISTORY:  Past Surgical History:  Procedure Laterality Date  . BRONCHIAL BIOPSY  11/23/2017   Procedure: BRONCHIAL BIOPSIES;  Surgeon: Grace Isaac, MD;  Location: [facility] OR;  Service: Thoracic;;  . cataract surgery  2007, 2008   repair bilat  . DG BARIUM ENEMA ([place] HX) N/A 2016   Lisa [name]  . HERNIA REPAIR  04/25/2001  . IR US GUIDE BX ASP/DRAIN  11/17/2017  . KNEE SURGERY     meniscus tear per Dr. Ronnie Derby, R knee  . MULTIPLE TOOTH EXTRACTIONS    . SEPTOPLASTY  2006   Dr. Ernesto Rutherford  . SKIN CANCER EXCISION    . TONSILLECTOMY  1954  . VAGINAL HYSTERECTOMY    . VIDEO BRONCHOSCOPY WITH ENDOBRONCHIAL ULTRASOUND N/A 11/23/2017   Procedure: VIDEO BRONCHOSCOPY WITH ENDOBRONCHIAL ULTRASOUND;  Surgeon: Grace Isaac, MD;  Location: [facility];  Service: Thoracic;  Laterality: N/A;    REVIEW OF SYSTEMS:   Review of Systems  Constitutional: Negative for chills, fatigue, fever and unexpected weight change.  Positive for decreased appetite. HENT:   Negative for mouth sores, nosebleeds.  Positive for mild odynophagia. Eyes: Negative for eye problems and icterus.  Respiratory: Negative for cough, hemoptysis, shortness of breath and wheezing.   Cardiovascular: Negative for chest pain and leg swelling.  Gastrointestinal: Negative for abdominal pain, diarrhea, nausea and vomiting.  Positive for constipation. Genitourinary: Negative for bladder incontinence, difficulty urinating, dysuria, frequency and hematuria.   Musculoskeletal: Negative for back pain, gait problem, neck pain and neck stiffness.  Skin: Negative for itching and rash.  Neurological: Negative for dizziness, extremity weakness, gait problem, headaches, light-headedness and seizures.  Hematological: Negative for adenopathy. Does not bruise/bleed easily.  Psychiatric/Behavioral:  Negative for confusion, depression and sleep disturbance. The patient is not nervous/anxious.     PHYSICAL EXAMINATION:  Blood pressure 97/60, pulse (!) 104, temperature 97.7 F (36.5 C), temperature source Oral, resp. rate 14, height 5\' 5"  (1.651 m), weight 176 lb 12.8 oz (80.2 kg), SpO2 98 %.  ECOG PERFORMANCE STATUS: 1 - Symptomatic but completely ambulatory  Physical Exam  Constitutional: Oriented to person, place, and time and well-developed, well-nourished, and in no distress. No distress.  HENT:  Head: Normocephalic and atraumatic.  Mouth/Throat: Oropharynx is clear and moist. No oropharyngeal exudate.  Eyes: Conjunctivae are normal. Right eye exhibits no discharge. Left eye exhibits no discharge. No scleral icterus.  Neck: Normal range of motion. Neck supple.  Cardiovascular: Normal rate, regular rhythm, normal heart sounds and intact distal pulses.   Pulmonary/Chest: Effort normal and breath sounds normal. No respiratory distress. No wheezes. No rales.  Abdominal: Soft. Bowel sounds are normal. Exhibits no distension and no mass. There is no tenderness.  Musculoskeletal: Normal range of motion. Exhibits no edema.  Lymphadenopathy:    No cervical adenopathy.  Neurological: Alert and oriented to person, place, and time. Exhibits normal muscle tone. Gait normal. Coordination normal.  Skin: Skin is warm and dry. No rash noted. Not diaphoretic. No erythema. No pallor.  Psychiatric: Mood, memory and judgment normal.  Vitals reviewed.  LABORATORY DATA: Lab Results  Component Value Date   WBC 3.6 (L) 01/03/2018   HGB 9.6 (L) 01/03/2018   HCT 29.3 (L) 01/03/2018   MCV 82.5 01/03/2018   PLT 171 01/03/2018      Chemistry      Component Value Date/Time   NA 138 01/03/2018 0821   K 3.2 (L) 01/03/2018 0821   CL 102 01/03/2018 0821   CO2 25 01/03/2018 0821   BUN 25 (H) 01/03/2018 0821   CREATININE 1.52 (H) 01/03/2018 0821      Component Value Date/Time   CALCIUM 9.9  01/03/2018 0821   ALKPHOS 66 01/03/2018 0821   AST 14 (L) 01/03/2018 0821   ALT 9 01/03/2018 0821   BILITOT 0.7 01/03/2018 0821       RADIOGRAPHIC STUDIES:  Mr Jeri Cos FV Contrast  Result Date: 12/07/2017 CLINICAL DATA:  New diagnosis lung cancer.  Staging. EXAM: MRI HEAD WITHOUT AND WITH CONTRAST TECHNIQUE: Multiplanar, multiecho pulse sequences of the brain and surrounding structures were obtained without and with intravenous contrast. CONTRAST:  8 mL Gadovist IV COMPARISON:  None. FINDINGS: Brain: Mild atrophy. Moderate to extensive chronic white matter changes. No acute infarct. Negative for hemorrhage mass or edema. Normal enhancement following contrast infusion. Negative for metastatic disease. Vascular: Normal arterial flow voids Skull and upper cervical spine: Negative Sinuses/Orbits: Mild mucosal edema paranasal sinuses. Bilateral cataract surgery. Other: None IMPRESSION: Negative for metastatic disease No acute abnormality. Atrophy and moderate to advanced chronic microvascular ischemic change in the white matter. Electronically Signed   By: Franchot Gallo M.D.   On: 12/07/2017 15:51     ASSESSMENT/PLAN:  Stage III squamous cell carcinoma of right lung [facility]) This is a very pleasant 84 year old white female with stage IIIb non-small cell lung cancer, squamous cell carcinoma.  She is currently undergoing a course of concurrent chemoradiation with weekly carboplatin and paclitaxel status post 3 cycles.    She is tolerating her treatment well overall with the exception of mild odynophagia and constipation. Recommend for her to proceed with cycle #4 of her treatment today as scheduled.  Okay to treat with creatinine of 1.52.  This is consistent with her baseline.  She will follow-up weekly for lab and chemotherapy and have a follow-up visit in 2 weeks for evaluation prior to cycle #6 of her treatment.  The patient's blood pressure is slightly low today.  She is on amlodipine and losartan.   I recommend that she check her blood pressure on a daily basis and if her systolic blood pressure less than 120, she was advised to hold her blood pressure medications.  She was encouraged to increase her fluid intake.  She will receive 500 cc of normal saline today in infusion with her chemotherapy.  For constipation, recommend she begin Senokot-S 1 tablet daily.  She may increase this to twice a day if once a day is not effective.  She was advised to call immediately if she has any concerning symptoms in the interval. The patient voices understanding of current disease status and treatment options and is in agreement with the current care plan.  All  questions were answered. The patient knows to call the clinic with any problems, questions or concerns. We can certainly see the patient much sooner if necessary.   No orders of the defined types were placed in this encounter.    Mikey Bussing, DNP, AGPCNP-BC, AOCNP 01/03/18

## 2018-01-03 NOTE — Assessment & Plan Note (Addendum)
This is a very pleasant 84 year old white female with stage IIIb non-small cell lung cancer, squamous cell carcinoma.  She is currently undergoing a course of concurrent chemoradiation with weekly carboplatin and paclitaxel status post 3 cycles.    She is tolerating her treatment well overall with the exception of mild odynophagia and constipation. Recommend for her to proceed with cycle #4 of her treatment today as scheduled.  Okay to treat with creatinine of 1.52.  This is consistent with her baseline.  She will follow-up weekly for lab and chemotherapy and have a follow-up visit in 2 weeks for evaluation prior to cycle #6 of her treatment.  The patient's blood pressure is slightly low today.  She is on amlodipine and losartan.  I recommend that she check her blood pressure on a daily basis and if her systolic blood pressure less than 120, she was advised to hold her blood pressure medications.  She was encouraged to increase her fluid intake.  She will receive 500 cc of normal saline today in infusion with her chemotherapy.  For constipation, recommend she begin Senokot-S 1 tablet daily.  She may increase this to twice a day if once a day is not effective.  She was advised to call immediately if she has any concerning symptoms in the interval. The patient voices understanding of current disease status and treatment options and is in agreement with the current care plan.  All questions were answered. The patient knows to call the clinic with any problems, questions or concerns. We can certainly see the patient much sooner if necessary.

## 2018-01-03 NOTE — Telephone Encounter (Signed)
Appts already scheduled per 10/7 los - no additional appts added.

## 2018-01-04 ENCOUNTER — Telehealth: Payer: Self-pay

## 2018-01-04 ENCOUNTER — Ambulatory Visit
Admission: RE | Admit: 2018-01-04 | Discharge: 2018-01-04 | Disposition: A | Discharge status: Home / Self Care | Payer: Medicare Other | Source: Ambulatory Visit | Attending: Radiation Oncology | Admitting: Radiation Oncology

## 2018-01-04 DIAGNOSIS — C342 Malignant neoplasm of middle lobe, bronchus or lung: Secondary | ICD-10-CM | POA: Diagnosis not present

## 2018-01-04 DIAGNOSIS — C3491 Malignant neoplasm of unspecified part of right bronchus or lung: Secondary | ICD-10-CM | POA: Diagnosis not present

## 2018-01-04 DIAGNOSIS — Z510 Encounter for antineoplastic radiation therapy: Secondary | ICD-10-CM | POA: Diagnosis not present

## 2018-01-04 NOTE — Telephone Encounter (Signed)
Unable to reach pt by phone.

## 2018-01-04 NOTE — Telephone Encounter (Signed)
PLEASE NOTE: All timestamps contained within this report are represented as [place] Standard Time. CONFIDENTIALTY NOTICE: This fax transmission is intended only for the addressee. It contains information that is legally privileged, confidential or otherwise protected from use or disclosure. If you are not the intended recipient, you are strictly prohibited from reviewing, disclosing, copying using or disseminating any of this information or taking any action in reliance on or regarding this information. If you have received this fax in error, please notify us immediately by telephone so that we can arrange for its return to [place]. Phone: [phone], Toll-Free: [phone], Fax: [phone] Page: 1 of 1 Call Id: [id number] [place] Night - Client Nonclinical Telephone Record [place] Night - Client Client Site [facility] Physician Renford Dills - MD Contact Type Call Who Is Calling Patient / Member / Family / Caregiver Caller Name Sheana Bir Caller Phone Number [phone] Patient Name Lisa [name] Patient DOB 09/06/1932 Call Type Message Only Information Provided Reason for Call Request to [facility] Appointment Initial Comment Caller states she would like to know what her appointment was for and that all of her appointment were cancelled. Additional Comment Call Closed By: Lezlie Octave Transaction Date/Time: 01/03/2018 7:46:58 PM (ET)

## 2018-01-05 ENCOUNTER — Telehealth: Payer: Self-pay

## 2018-01-05 ENCOUNTER — Ambulatory Visit: Payer: Medicare Other

## 2018-01-05 NOTE — Telephone Encounter (Signed)
Contacted pt's granddaughter after discussion with Dr. Sondra Come. Per Dr. Sondra Come, no plan to "suspend" radiation treatments but Dr. Sondra Come did convey to pt that if her fatigue became severe, he would consider giving pt a break of a few days from radiation treatments. Granddaughter stated she would discuss with pt and knows to contact this RN with any further questions/concerns. Granddaughter verbalized understanding and agreement. Loma Sousa, RN BSN

## 2018-01-05 NOTE — Telephone Encounter (Signed)
Agreed.  Noted.  Thanks.

## 2018-01-05 NOTE — Telephone Encounter (Signed)
FYI Spoke with patient to clarify message below, patient just wanted to make sure that appointments with our clinic to be cancelled, if there are any in the future, because she is going to Cancer center everyday and has constant labs drawn. She will call us at some point and come in to follow up with Dr Damita Dunnings when things slow down some. She has had her flu shot already also. Kris Mouton, RMA

## 2018-01-05 NOTE — Telephone Encounter (Signed)
Pt's granddaughter calling to f/u re: pt's PUT visit yesterday. Granddaughter states that pt conveyed that Dr. Sondra Come "has suspended" radiation treatments and wants to clarify. Conveyed to granddaughter that per PUT visit, the plan was to continue with treatments. Conveyed to granddaughter that this RN would discuss treatment plan and this RN would contact granddaughter back with Dr. Clabe Seal recommendations. Encouraged granddaughter to have pt apply Sonafine liberally to radiation field and relayed to granddaughter that pt's skin was red and dry but intact. Granddaughter verbalized understanding and agreement. Loma Sousa, RN BSN

## 2018-01-06 ENCOUNTER — Ambulatory Visit
Admission: RE | Admit: 2018-01-06 | Discharge: 2018-01-06 | Disposition: A | Discharge status: Home / Self Care | Payer: Medicare Other | Source: Ambulatory Visit | Attending: Radiation Oncology | Admitting: Radiation Oncology

## 2018-01-06 DIAGNOSIS — C3491 Malignant neoplasm of unspecified part of right bronchus or lung: Secondary | ICD-10-CM | POA: Diagnosis not present

## 2018-01-06 DIAGNOSIS — C342 Malignant neoplasm of middle lobe, bronchus or lung: Secondary | ICD-10-CM | POA: Diagnosis not present

## 2018-01-06 DIAGNOSIS — Z510 Encounter for antineoplastic radiation therapy: Secondary | ICD-10-CM | POA: Diagnosis not present

## 2018-01-06 LAB — ACID FAST CULTURE WITH REFLEXED SENSITIVITIES (MYCOBACTERIA): Acid Fast Culture: NEGATIVE

## 2018-01-07 ENCOUNTER — Ambulatory Visit
Admission: RE | Admit: 2018-01-07 | Discharge: 2018-01-07 | Disposition: A | Discharge status: Home / Self Care | Payer: Medicare Other | Source: Ambulatory Visit | Attending: Radiation Oncology | Admitting: Radiation Oncology

## 2018-01-07 DIAGNOSIS — Z510 Encounter for antineoplastic radiation therapy: Secondary | ICD-10-CM | POA: Diagnosis not present

## 2018-01-07 DIAGNOSIS — C342 Malignant neoplasm of middle lobe, bronchus or lung: Secondary | ICD-10-CM | POA: Diagnosis not present

## 2018-01-07 DIAGNOSIS — C3491 Malignant neoplasm of unspecified part of right bronchus or lung: Secondary | ICD-10-CM | POA: Diagnosis not present

## 2018-01-10 ENCOUNTER — Ambulatory Visit
Admission: RE | Admit: 2018-01-10 | Discharge: 2018-01-10 | Disposition: A | Discharge status: Home / Self Care | Payer: Medicare Other | Source: Ambulatory Visit | Attending: Radiation Oncology | Admitting: Radiation Oncology

## 2018-01-10 ENCOUNTER — Inpatient Hospital Stay: Payer: Medicare Other

## 2018-01-10 VITALS — BP 140/84 | HR 103 | Temp 98.8°F | Resp 16 | Wt 178.0 lb

## 2018-01-10 DIAGNOSIS — Z85820 Personal history of malignant melanoma of skin: Secondary | ICD-10-CM | POA: Diagnosis not present

## 2018-01-10 DIAGNOSIS — R630 Anorexia: Secondary | ICD-10-CM | POA: Diagnosis not present

## 2018-01-10 DIAGNOSIS — C3491 Malignant neoplasm of unspecified part of right bronchus or lung: Secondary | ICD-10-CM

## 2018-01-10 DIAGNOSIS — E039 Hypothyroidism, unspecified: Secondary | ICD-10-CM | POA: Diagnosis not present

## 2018-01-10 DIAGNOSIS — Z5111 Encounter for antineoplastic chemotherapy: Secondary | ICD-10-CM | POA: Diagnosis not present

## 2018-01-10 DIAGNOSIS — Z79899 Other long term (current) drug therapy: Secondary | ICD-10-CM | POA: Diagnosis not present

## 2018-01-10 DIAGNOSIS — L589 Radiodermatitis, unspecified: Secondary | ICD-10-CM | POA: Diagnosis not present

## 2018-01-10 DIAGNOSIS — Z510 Encounter for antineoplastic radiation therapy: Secondary | ICD-10-CM | POA: Diagnosis not present

## 2018-01-10 DIAGNOSIS — D649 Anemia, unspecified: Secondary | ICD-10-CM

## 2018-01-10 DIAGNOSIS — K5900 Constipation, unspecified: Secondary | ICD-10-CM | POA: Diagnosis not present

## 2018-01-10 DIAGNOSIS — R070 Pain in throat: Secondary | ICD-10-CM | POA: Diagnosis not present

## 2018-01-10 DIAGNOSIS — E785 Hyperlipidemia, unspecified: Secondary | ICD-10-CM | POA: Diagnosis not present

## 2018-01-10 DIAGNOSIS — I10 Essential (primary) hypertension: Secondary | ICD-10-CM | POA: Diagnosis not present

## 2018-01-10 DIAGNOSIS — C342 Malignant neoplasm of middle lobe, bronchus or lung: Secondary | ICD-10-CM | POA: Diagnosis not present

## 2018-01-10 LAB — CBC WITH DIFFERENTIAL (CANCER CENTER ONLY)
Abs Immature Granulocytes: 0.02 10*3/uL (ref 0.00–0.07)
Basophils Absolute: 0 10*3/uL (ref 0.0–0.1)
Basophils Relative: 1 %
Eosinophils Absolute: 0 10*3/uL (ref 0.0–0.5)
Eosinophils Relative: 1 %
HCT: 25.4 % — ABNORMAL LOW (ref 36.0–46.0)
Hemoglobin: 8 g/dL — ABNORMAL LOW (ref 12.0–15.0)
Immature Granulocytes: 1 %
Lymphocytes Relative: 12 %
Lymphs Abs: 0.3 10*3/uL — ABNORMAL LOW (ref 0.7–4.0)
MCH: 26.8 pg (ref 26.0–34.0)
MCHC: 31.5 g/dL (ref 30.0–36.0)
MCV: 84.9 fL (ref 80.0–100.0)
Monocytes Absolute: 0.2 10*3/uL (ref 0.1–1.0)
Monocytes Relative: 6 %
Neutro Abs: 2.2 10*3/uL (ref 1.7–7.7)
Neutrophils Relative %: 79 %
Platelet Count: 94 10*3/uL — ABNORMAL LOW (ref 150–400)
RBC: 2.99 MIL/uL — ABNORMAL LOW (ref 3.87–5.11)
RDW: 16.5 % — ABNORMAL HIGH (ref 11.5–15.5)
WBC Count: 2.7 10*3/uL — ABNORMAL LOW (ref 4.0–10.5)
nRBC: 0 % (ref 0.0–0.2)

## 2018-01-10 LAB — COMPREHENSIVE METABOLIC PANEL
ALT: 12 U/L (ref 0–44)
AST: 19 U/L (ref 15–41)
Albumin: 3.1 g/dL — ABNORMAL LOW (ref 3.5–5.0)
Alkaline Phosphatase: 46 U/L (ref 38–126)
Anion gap: 12 (ref 5–15)
BUN: 22 mg/dL (ref 8–23)
CO2: 25 mmol/L (ref 22–32)
Calcium: 9.1 mg/dL (ref 8.9–10.3)
Chloride: 100 mmol/L (ref 98–111)
Creatinine, Ser: 1.11 mg/dL — ABNORMAL HIGH (ref 0.44–1.00)
GFR calc Af Amer: 52 mL/min — ABNORMAL LOW (ref >60)
GFR calc non Af Amer: 45 mL/min — ABNORMAL LOW (ref >60)
Glucose, Bld: 93 mg/dL (ref 70–99)
Potassium: 3.2 mmol/L — ABNORMAL LOW (ref 3.5–5.1)
Sodium: 137 mmol/L (ref 135–145)
Total Bilirubin: 0.6 mg/dL (ref 0.3–1.2)
Total Protein: 6.3 g/dL — ABNORMAL LOW (ref 6.5–8.1)

## 2018-01-10 LAB — PREPARE RBC (CROSSMATCH)

## 2018-01-10 LAB — ABO/RH: ABO/RH(D): O NEG

## 2018-01-10 MED ORDER — DIPHENHYDRAMINE HCL 50 MG/ML IJ SOLN
25.0000 mg | Freq: Once | INTRAMUSCULAR | Status: AC
  Administered 2018-01-10: 25 mg via INTRAVENOUS

## 2018-01-10 MED ORDER — ACETAMINOPHEN 325 MG PO TABS: 650.0000 mg | ORAL_TABLET | Freq: Once | ORAL | Status: AC

## 2018-01-10 MED ORDER — SODIUM CHLORIDE 0.9 % IV SOLN
45.0000 mg/m2 | Freq: Once | INTRAVENOUS | Status: AC
  Administered 2018-01-10: 84 mg via INTRAVENOUS
  Filled 2018-01-10: qty 14

## 2018-01-10 MED ORDER — FAMOTIDINE IN NACL 20-0.9 MG/50ML-% IV SOLN
20.0000 mg | Freq: Once | INTRAVENOUS | Status: AC
  Administered 2018-01-10: 20 mg via INTRAVENOUS

## 2018-01-10 MED ORDER — DIPHENHYDRAMINE HCL 50 MG/ML IJ SOLN
INTRAMUSCULAR | Status: AC
  Filled 2018-01-10: qty 1

## 2018-01-10 MED ORDER — FAMOTIDINE IN NACL 20-0.9 MG/50ML-% IV SOLN
INTRAVENOUS | Status: AC
  Filled 2018-01-10: qty 50

## 2018-01-10 MED ORDER — PALONOSETRON HCL INJECTION 0.25 MG/5ML
INTRAVENOUS | Status: AC
  Filled 2018-01-10: qty 5

## 2018-01-10 MED ORDER — SODIUM CHLORIDE 0.9 % IV SOLN
117.2000 mg | Freq: Once | INTRAVENOUS | Status: AC
  Administered 2018-01-10: 120 mg via INTRAVENOUS
  Filled 2018-01-10: qty 12

## 2018-01-10 MED ORDER — SODIUM CHLORIDE 0.9 % IV SOLN
Freq: Once | INTRAVENOUS | Status: AC
  Administered 2018-01-10: 13:00:00 via INTRAVENOUS
  Filled 2018-01-10: qty 250

## 2018-01-10 MED ORDER — SODIUM CHLORIDE 0.9 % IV SOLN
20.0000 mg | Freq: Once | INTRAVENOUS | Status: AC
  Administered 2018-01-10: 20 mg via INTRAVENOUS
  Filled 2018-01-10: qty 2

## 2018-01-10 MED ORDER — PALONOSETRON HCL INJECTION 0.25 MG/5ML
0.2500 mg | Freq: Once | INTRAVENOUS | Status: AC
  Administered 2018-01-10: 0.25 mg via INTRAVENOUS

## 2018-01-10 NOTE — Progress Notes (Signed)
Per Dr. Lindi Adie to keep carboplatin dose the same today despite changes in Scr.   Jalene Mullet, PharmD PGY2 Hematology/ Oncology Pharmacy Resident 01/10/2018 12:55 PM

## 2018-01-10 NOTE — Patient Instructions (Signed)
[facility] Discharge Instructions for Patients Receiving Chemotherapy  Today you received the following chemotherapy agents:  Taxol, Carboplatin  To help prevent nausea and vomiting after your treatment, we encourage you to take your nausea medication as prescribed.   If you develop nausea and vomiting that is not controlled by your nausea medication, call the clinic.   BELOW ARE SYMPTOMS THAT SHOULD BE REPORTED IMMEDIATELY:  *FEVER GREATER THAN 100.5 F  *CHILLS WITH OR WITHOUT FEVER  NAUSEA AND VOMITING THAT IS NOT CONTROLLED WITH YOUR NAUSEA MEDICATION  *UNUSUAL SHORTNESS OF BREATH  *UNUSUAL BRUISING OR BLEEDING  TENDERNESS IN MOUTH AND THROAT WITH OR WITHOUT PRESENCE OF ULCERS  *URINARY PROBLEMS  *BOWEL PROBLEMS  UNUSUAL RASH Items with * indicate a potential emergency and should be followed up as soon as possible.  Feel free to call the clinic should you have any questions or concerns. The clinic phone number is [phone].  Please show the CHEMO ALERT CARD at check-in to the Emergency Department and triage nurse.   

## 2018-01-10 NOTE — Progress Notes (Signed)
Per Dr Lindi Adie it is okay to treat pt today with carboplatin /taxol and today"s plt count and hgb and other labs.

## 2018-01-11 ENCOUNTER — Inpatient Hospital Stay: Payer: Medicare Other

## 2018-01-11 ENCOUNTER — Ambulatory Visit
Admission: RE | Admit: 2018-01-11 | Discharge: 2018-01-11 | Disposition: A | Discharge status: Home / Self Care | Payer: Medicare Other | Source: Ambulatory Visit | Attending: Radiation Oncology | Admitting: Radiation Oncology

## 2018-01-11 ENCOUNTER — Encounter: Payer: Self-pay | Admitting: Internal Medicine

## 2018-01-11 DIAGNOSIS — R630 Anorexia: Secondary | ICD-10-CM | POA: Diagnosis not present

## 2018-01-11 DIAGNOSIS — C3491 Malignant neoplasm of unspecified part of right bronchus or lung: Secondary | ICD-10-CM

## 2018-01-11 DIAGNOSIS — C342 Malignant neoplasm of middle lobe, bronchus or lung: Secondary | ICD-10-CM | POA: Diagnosis not present

## 2018-01-11 DIAGNOSIS — Z510 Encounter for antineoplastic radiation therapy: Secondary | ICD-10-CM | POA: Diagnosis not present

## 2018-01-11 DIAGNOSIS — Z79899 Other long term (current) drug therapy: Secondary | ICD-10-CM | POA: Diagnosis not present

## 2018-01-11 DIAGNOSIS — I10 Essential (primary) hypertension: Secondary | ICD-10-CM | POA: Diagnosis not present

## 2018-01-11 DIAGNOSIS — E785 Hyperlipidemia, unspecified: Secondary | ICD-10-CM | POA: Diagnosis not present

## 2018-01-11 DIAGNOSIS — E039 Hypothyroidism, unspecified: Secondary | ICD-10-CM | POA: Diagnosis not present

## 2018-01-11 DIAGNOSIS — D649 Anemia, unspecified: Secondary | ICD-10-CM

## 2018-01-11 DIAGNOSIS — K5900 Constipation, unspecified: Secondary | ICD-10-CM | POA: Diagnosis not present

## 2018-01-11 DIAGNOSIS — L589 Radiodermatitis, unspecified: Secondary | ICD-10-CM | POA: Diagnosis not present

## 2018-01-11 DIAGNOSIS — R070 Pain in throat: Secondary | ICD-10-CM | POA: Diagnosis not present

## 2018-01-11 DIAGNOSIS — Z5111 Encounter for antineoplastic chemotherapy: Secondary | ICD-10-CM | POA: Diagnosis not present

## 2018-01-11 DIAGNOSIS — Z85820 Personal history of malignant melanoma of skin: Secondary | ICD-10-CM | POA: Diagnosis not present

## 2018-01-11 MED ORDER — SONAFINE EX EMUL
1.0000 "application " | Freq: Two times a day (BID) | CUTANEOUS | Status: DC
  Administered 2018-01-11: 1 via TOPICAL

## 2018-01-11 MED ORDER — DIPHENHYDRAMINE HCL 25 MG PO CAPS
25.0000 mg | ORAL_CAPSULE | Freq: Once | ORAL | Status: AC
  Administered 2018-01-11: 25 mg via ORAL

## 2018-01-11 MED ORDER — SODIUM CHLORIDE 0.9% IV SOLUTION
250.0000 mL | Freq: Once | INTRAVENOUS | Status: AC
  Administered 2018-01-11: 250 mL via INTRAVENOUS
  Filled 2018-01-11: qty 250

## 2018-01-11 MED ORDER — DIPHENHYDRAMINE HCL 25 MG PO CAPS
ORAL_CAPSULE | ORAL | Status: AC
  Filled 2018-01-11: qty 1

## 2018-01-11 NOTE — Progress Notes (Signed)
Went to infusion to introduce myself as Arboriculturist and to discuss available resources.  Discussed the one-time $500 [place] to assist with personal expenses while going through treatment. Advised patient proof of household income would be needed to apply. Wrote the information on the back of my card per her request.   She had some questions regarding billing. Advised patient to contact the number on the bill to patient accounting for detailed billing information. She has my card for any additional financial questions or concerns.

## 2018-01-11 NOTE — Patient Instructions (Signed)

## 2018-01-12 ENCOUNTER — Ambulatory Visit
Admission: RE | Admit: 2018-01-12 | Discharge: 2018-01-12 | Disposition: A | Discharge status: Home / Self Care | Payer: Medicare Other | Source: Ambulatory Visit | Attending: Radiation Oncology | Admitting: Radiation Oncology

## 2018-01-12 DIAGNOSIS — C342 Malignant neoplasm of middle lobe, bronchus or lung: Secondary | ICD-10-CM | POA: Diagnosis not present

## 2018-01-12 DIAGNOSIS — C3491 Malignant neoplasm of unspecified part of right bronchus or lung: Secondary | ICD-10-CM | POA: Diagnosis not present

## 2018-01-12 DIAGNOSIS — Z510 Encounter for antineoplastic radiation therapy: Secondary | ICD-10-CM | POA: Diagnosis not present

## 2018-01-12 LAB — BPAM RBC
Blood Product Expiration Date: 201911192359
Blood Product Expiration Date: 201911192359
ISSUE DATE / TIME: 201910150901
ISSUE DATE / TIME: 201910150901
Unit Type and Rh: 9500
Unit Type and Rh: 9500

## 2018-01-12 LAB — TYPE AND SCREEN
ABO/RH(D): O NEG
Antibody Screen: NEGATIVE
Unit division: 0
Unit division: 0

## 2018-01-13 ENCOUNTER — Ambulatory Visit
Admission: RE | Admit: 2018-01-13 | Discharge: 2018-01-13 | Disposition: A | Discharge status: Home / Self Care | Payer: Medicare Other | Source: Ambulatory Visit | Attending: Radiation Oncology | Admitting: Radiation Oncology

## 2018-01-13 ENCOUNTER — Other Ambulatory Visit: Payer: Self-pay | Admitting: Medical Oncology

## 2018-01-13 DIAGNOSIS — Z510 Encounter for antineoplastic radiation therapy: Secondary | ICD-10-CM | POA: Diagnosis not present

## 2018-01-13 DIAGNOSIS — C3491 Malignant neoplasm of unspecified part of right bronchus or lung: Secondary | ICD-10-CM | POA: Diagnosis not present

## 2018-01-13 DIAGNOSIS — E876 Hypokalemia: Secondary | ICD-10-CM

## 2018-01-13 DIAGNOSIS — C342 Malignant neoplasm of middle lobe, bronchus or lung: Secondary | ICD-10-CM | POA: Diagnosis not present

## 2018-01-13 MED ORDER — POTASSIUM CHLORIDE CRYS ER 20 MEQ PO TBCR: 20.0000 meq | EXTENDED_RELEASE_TABLET | Freq: Two times a day (BID) | ORAL | 0 refills | Status: DC

## 2018-01-13 NOTE — Progress Notes (Signed)
PT notified to pick up kdur.

## 2018-01-14 ENCOUNTER — Ambulatory Visit
Admission: RE | Admit: 2018-01-14 | Discharge: 2018-01-14 | Disposition: A | Discharge status: Home / Self Care | Payer: Medicare Other | Source: Ambulatory Visit | Attending: Radiation Oncology | Admitting: Radiation Oncology

## 2018-01-14 DIAGNOSIS — Z510 Encounter for antineoplastic radiation therapy: Secondary | ICD-10-CM | POA: Diagnosis not present

## 2018-01-14 DIAGNOSIS — C342 Malignant neoplasm of middle lobe, bronchus or lung: Secondary | ICD-10-CM | POA: Diagnosis not present

## 2018-01-14 DIAGNOSIS — C3491 Malignant neoplasm of unspecified part of right bronchus or lung: Secondary | ICD-10-CM | POA: Diagnosis not present

## 2018-01-17 ENCOUNTER — Inpatient Hospital Stay: Payer: Medicare Other

## 2018-01-17 ENCOUNTER — Encounter: Payer: Self-pay | Admitting: Internal Medicine

## 2018-01-17 ENCOUNTER — Inpatient Hospital Stay (HOSPITAL_BASED_OUTPATIENT_CLINIC_OR_DEPARTMENT_OTHER): Payer: Medicare Other | Admitting: Internal Medicine

## 2018-01-17 ENCOUNTER — Ambulatory Visit
Admission: RE | Admit: 2018-01-17 | Discharge: 2018-01-17 | Disposition: A | Discharge status: Home / Self Care | Payer: Medicare Other | Source: Ambulatory Visit | Attending: Radiation Oncology | Admitting: Radiation Oncology

## 2018-01-17 ENCOUNTER — Inpatient Hospital Stay: Payer: Medicare Other | Admitting: Nutrition

## 2018-01-17 ENCOUNTER — Telehealth: Payer: Self-pay | Admitting: Internal Medicine

## 2018-01-17 VITALS — BP 145/89 | HR 100 | Temp 97.6°F | Resp 18 | Ht 65.0 in | Wt 178.4 lb

## 2018-01-17 DIAGNOSIS — Z85820 Personal history of malignant melanoma of skin: Secondary | ICD-10-CM | POA: Diagnosis not present

## 2018-01-17 DIAGNOSIS — Z79899 Other long term (current) drug therapy: Secondary | ICD-10-CM

## 2018-01-17 DIAGNOSIS — C3491 Malignant neoplasm of unspecified part of right bronchus or lung: Secondary | ICD-10-CM

## 2018-01-17 DIAGNOSIS — R070 Pain in throat: Secondary | ICD-10-CM

## 2018-01-17 DIAGNOSIS — E785 Hyperlipidemia, unspecified: Secondary | ICD-10-CM | POA: Diagnosis not present

## 2018-01-17 DIAGNOSIS — R630 Anorexia: Secondary | ICD-10-CM | POA: Diagnosis not present

## 2018-01-17 DIAGNOSIS — L589 Radiodermatitis, unspecified: Secondary | ICD-10-CM

## 2018-01-17 DIAGNOSIS — Z5111 Encounter for antineoplastic chemotherapy: Secondary | ICD-10-CM | POA: Diagnosis not present

## 2018-01-17 DIAGNOSIS — I10 Essential (primary) hypertension: Secondary | ICD-10-CM

## 2018-01-17 DIAGNOSIS — Z510 Encounter for antineoplastic radiation therapy: Secondary | ICD-10-CM | POA: Diagnosis not present

## 2018-01-17 DIAGNOSIS — Y842 Radiological procedure and radiotherapy as the cause of abnormal reaction of the patient, or of later complication, without mention of misadventure at the time of the procedure: Secondary | ICD-10-CM | POA: Diagnosis not present

## 2018-01-17 DIAGNOSIS — C342 Malignant neoplasm of middle lobe, bronchus or lung: Secondary | ICD-10-CM | POA: Diagnosis not present

## 2018-01-17 DIAGNOSIS — C3490 Malignant neoplasm of unspecified part of unspecified bronchus or lung: Secondary | ICD-10-CM

## 2018-01-17 DIAGNOSIS — E039 Hypothyroidism, unspecified: Secondary | ICD-10-CM | POA: Diagnosis not present

## 2018-01-17 DIAGNOSIS — K5900 Constipation, unspecified: Secondary | ICD-10-CM | POA: Diagnosis not present

## 2018-01-17 LAB — CMP (CANCER CENTER ONLY)
ALT: 9 U/L (ref 0–44)
AST: 14 U/L — ABNORMAL LOW (ref 15–41)
Albumin: 3 g/dL — ABNORMAL LOW (ref 3.5–5.0)
Alkaline Phosphatase: 54 U/L (ref 38–126)
Anion gap: 9 (ref 5–15)
BUN: 15 mg/dL (ref 8–23)
CO2: 26 mmol/L (ref 22–32)
Calcium: 9.5 mg/dL (ref 8.9–10.3)
Chloride: 104 mmol/L (ref 98–111)
Creatinine: 1.07 mg/dL — ABNORMAL HIGH (ref 0.44–1.00)
GFR, Est AFR Am: 54 mL/min — ABNORMAL LOW (ref >60)
GFR, Estimated: 47 mL/min — ABNORMAL LOW (ref >60)
Glucose, Bld: 95 mg/dL (ref 70–99)
Potassium: 4 mmol/L (ref 3.5–5.1)
Sodium: 139 mmol/L (ref 135–145)
Total Bilirubin: 0.9 mg/dL (ref 0.3–1.2)
Total Protein: 6.1 g/dL — ABNORMAL LOW (ref 6.5–8.1)

## 2018-01-17 LAB — CBC WITH DIFFERENTIAL (CANCER CENTER ONLY)
Abs Immature Granulocytes: 0.01 10*3/uL (ref 0.00–0.07)
Basophils Absolute: 0 10*3/uL (ref 0.0–0.1)
Basophils Relative: 1 %
Eosinophils Absolute: 0 10*3/uL (ref 0.0–0.5)
Eosinophils Relative: 1 %
HCT: 31.2 % — ABNORMAL LOW (ref 36.0–46.0)
Hemoglobin: 9.9 g/dL — ABNORMAL LOW (ref 12.0–15.0)
Immature Granulocytes: 1 %
Lymphocytes Relative: 21 %
Lymphs Abs: 0.3 10*3/uL — ABNORMAL LOW (ref 0.7–4.0)
MCH: 27.9 pg (ref 26.0–34.0)
MCHC: 31.7 g/dL (ref 30.0–36.0)
MCV: 87.9 fL (ref 80.0–100.0)
Monocytes Absolute: 0.1 10*3/uL (ref 0.1–1.0)
Monocytes Relative: 8 %
Neutro Abs: 1.1 10*3/uL — ABNORMAL LOW (ref 1.7–7.7)
Neutrophils Relative %: 68 %
Platelet Count: 56 10*3/uL — ABNORMAL LOW (ref 150–400)
RBC: 3.55 MIL/uL — ABNORMAL LOW (ref 3.87–5.11)
RDW: 16.1 % — ABNORMAL HIGH (ref 11.5–15.5)
WBC Count: 1.6 10*3/uL — ABNORMAL LOW (ref 4.0–10.5)
nRBC: 0 % (ref 0.0–0.2)

## 2018-01-17 MED ORDER — PALONOSETRON HCL INJECTION 0.25 MG/5ML
INTRAVENOUS | Status: AC
  Filled 2018-01-17: qty 5

## 2018-01-17 MED ORDER — SODIUM CHLORIDE 0.9 % IV SOLN
Freq: Once | INTRAVENOUS | Status: AC
  Administered 2018-01-17: 12:00:00 via INTRAVENOUS
  Filled 2018-01-17: qty 250

## 2018-01-17 MED ORDER — DIPHENHYDRAMINE HCL 50 MG/ML IJ SOLN
INTRAMUSCULAR | Status: AC
  Filled 2018-01-17: qty 1

## 2018-01-17 MED ORDER — SODIUM CHLORIDE 0.9 % IV SOLN
120.0000 mg | Freq: Once | INTRAVENOUS | Status: DC
  Filled 2018-01-17: qty 12

## 2018-01-17 MED ORDER — PALONOSETRON HCL INJECTION 0.25 MG/5ML
0.2500 mg | Freq: Once | INTRAVENOUS | Status: AC
  Administered 2018-01-17: 0.25 mg via INTRAVENOUS

## 2018-01-17 MED ORDER — DIPHENHYDRAMINE HCL 50 MG/ML IJ SOLN
25.0000 mg | Freq: Once | INTRAMUSCULAR | Status: AC
  Administered 2018-01-17: 25 mg via INTRAVENOUS

## 2018-01-17 MED ORDER — SODIUM CHLORIDE 0.9 % IV SOLN
20.0000 mg | Freq: Once | INTRAVENOUS | Status: AC
  Administered 2018-01-17: 20 mg via INTRAVENOUS
  Filled 2018-01-17: qty 2

## 2018-01-17 MED ORDER — FAMOTIDINE IN NACL 20-0.9 MG/50ML-% IV SOLN
20.0000 mg | Freq: Once | INTRAVENOUS | Status: AC
  Administered 2018-01-17: 20 mg via INTRAVENOUS

## 2018-01-17 MED ORDER — SODIUM CHLORIDE 0.9 % IV SOLN
45.0000 mg/m2 | Freq: Once | INTRAVENOUS | Status: AC
  Administered 2018-01-17: 84 mg via INTRAVENOUS
  Filled 2018-01-17: qty 14

## 2018-01-17 MED ORDER — FAMOTIDINE IN NACL 20-0.9 MG/50ML-% IV SOLN
INTRAVENOUS | Status: AC
  Filled 2018-01-17: qty 50

## 2018-01-17 NOTE — Progress Notes (Signed)
Patient will not receive carboplatin today per Dr. Julien Nordmann due to low platlet count.

## 2018-01-17 NOTE — Telephone Encounter (Signed)
Appts scheduled avs/calendar printed per 10/21 los

## 2018-01-17 NOTE — Patient Instructions (Addendum)
[facility] Discharge Instructions for Patients Receiving Chemotherapy  Today you received the following chemotherapy agents:  Taxol.  To help prevent nausea and vomiting after your treatment, we encourage you to take your nausea medication as directed.   If you develop nausea and vomiting that is not controlled by your nausea medication, call the clinic.   BELOW ARE SYMPTOMS THAT SHOULD BE REPORTED IMMEDIATELY:  *FEVER GREATER THAN 100.5 F  *CHILLS WITH OR WITHOUT FEVER  NAUSEA AND VOMITING THAT IS NOT CONTROLLED WITH YOUR NAUSEA MEDICATION  *UNUSUAL SHORTNESS OF BREATH  *UNUSUAL BRUISING OR BLEEDING  TENDERNESS IN MOUTH AND THROAT WITH OR WITHOUT PRESENCE OF ULCERS  *URINARY PROBLEMS  *BOWEL PROBLEMS  UNUSUAL RASH Items with * indicate a potential emergency and should be followed up as soon as possible.  Feel free to call the clinic should you have any questions or concerns. The clinic phone number is [phone].  Please show the CHEMO ALERT CARD at check-in to the Emergency Department and triage nurse.   

## 2018-01-17 NOTE — Progress Notes (Signed)
[place] [phone]   [phone]  OFFICE PROGRESS NOTE  [name], MD [place] [address]  DIAGNOSIS: Stage IIIb (T3, N3, M0) non-small cell lung cancer, squamous cell carcinoma presented with large right middle lobe lung mass in addition to right hilar, subcarinal and right supraclavicular lymphadenopathy diagnosed in August 2019.  PRIOR THERAPY: None.  CURRENT THERAPY: Concurrent chemoradiation with weekly carboplatin for AUC of 2 and paclitaxel 45 NG/M2.  Status post 5 cycles.  INTERVAL HISTORY: Lisa [name] 84 y.o. female returns to the clinic today for follow-up visit accompanied by her daughter.  The patient is feeling fine today with no concerning complaints except for a skin burn from the radiation on the right side of the chest.  She denied having any significant chest pain, shortness of breath, cough or hemoptysis.  She continues to have some sore throat.  The patient denied having any nausea, vomiting, diarrhea or constipation.  She has been tolerating the course of concurrent chemoradiation fairly well except for the sore throat and mild odynophagia.  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  The patient is here today for evaluation before starting cycle #6 of her treatment.  MEDICAL HISTORY: Past Medical History:  Diagnosis Date  . Diverticulosis   . Headache   . Hemorrhoids    internal and external  . Hyperlipidemia 01/29/1991  . Hypertension 10/29/1995  . Hypothyroidism 10/29/1995  . Lung mass    right middle lobe  . Melanoma ([place])    h/o, local excision. no chemo or rady.   . Skin cancer (melanoma) ([place])   . Tubular adenoma of colon   . Wears dentures     ALLERGIES:  is allergic to atorvastatin; celebrex [celecoxib]; cholecalciferol; and simvastatin.  MEDICATIONS:  Current Outpatient Medications  Medication Sig Dispense Refill  . amLODipine (NORVASC) 10 MG  tablet Take 1 tablet (10 mg total) by mouth daily. 90 tablet 3  . Cholecalciferol (VITAMIN D) 2000 units tablet Take 2,000 Units by mouth 2 (two) times daily.    . Coenzyme Q10 100 MG capsule Take 100 mg by mouth daily.     Marland Kitchen docusate sodium (COLACE) 100 MG capsule Take 100 mg by mouth daily as needed for mild constipation.    . fluticasone (FLONASE) 50 MCG/ACT nasal spray USE 2 SPRAYS IN EACH NOSTRIL DAILY AS NEEDED FOR RHINITIS 48 g 3  . levothyroxine (SYNTHROID, LEVOTHROID) 75 MCG tablet Take 1 tablet (75 mcg total) by mouth daily. 90 tablet 3  . losartan-hydrochlorothiazide (HYZAAR) 100-25 MG tablet Take 1 tablet by mouth daily. 90 tablet 3  . Melatonin 3 MG TABS Take 1 tablet by mouth at bedtime as needed.    . potassium chloride SA (K-DUR,KLOR-CON) 20 MEQ tablet Take 1 tablet (20 mEq total) by mouth 2 (two) times daily. 7 tablet 0  . prochlorperazine (COMPAZINE) 10 MG tablet Take 1 tablet (10 mg total) by mouth every 6 (six) hours as needed for nausea or vomiting. (Patient not taking: Reported on 01/03/2018) 30 tablet 0  . sennosides-docusate sodium (SENOKOT-S) 8.6-50 MG tablet Take 1 tablet by mouth daily as needed for constipation.    . sucralfate (CARAFATE) 1 g tablet Take 1 tablet (1 g total) by mouth 4 (four) times daily -  with meals and at bedtime. Crush and dissolve in 1 tablespoon of water prior to swallowing 30 tablet 2   No current facility-administered medications for this  visit.    Facility-Administered Medications Ordered in Other Visits  Medication Dose Route Frequency Provider Last Rate Last Dose  . acetaminophen (TYLENOL) tablet 650 mg  650 mg Oral Once Nicholas Lose, MD        SURGICAL HISTORY:  Past Surgical History:  Procedure Laterality Date  . BRONCHIAL BIOPSY  11/23/2017   Procedure: BRONCHIAL BIOPSIES;  Surgeon: Grace Isaac, MD;  Location: [facility] OR;  Service: Thoracic;;  . cataract surgery  2007, 2008   repair bilat  . DG BARIUM ENEMA ([place] HX) N/A 2016    [name]  . HERNIA REPAIR  04/25/2001  . IR US GUIDE BX ASP/DRAIN  11/17/2017  . KNEE SURGERY     meniscus tear per Dr. Ronnie Derby, R knee  . MULTIPLE TOOTH EXTRACTIONS    . SEPTOPLASTY  2006   Dr. Ernesto Rutherford  . SKIN CANCER EXCISION    . TONSILLECTOMY  1954  . VAGINAL HYSTERECTOMY    . VIDEO BRONCHOSCOPY WITH ENDOBRONCHIAL ULTRASOUND N/A 11/23/2017   Procedure: VIDEO BRONCHOSCOPY WITH ENDOBRONCHIAL ULTRASOUND;  Surgeon: Grace Isaac, MD;  Location: [place];  Service: Thoracic;  Laterality: N/A;    REVIEW OF SYSTEMS:  A comprehensive review of systems was negative except for: Constitutional: positive for fatigue Ears, nose, mouth, throat, and face: positive for sore throat Gastrointestinal: positive for odynophagia   PHYSICAL EXAMINATION: General appearance: alert, cooperative, fatigued and no distress Head: Normocephalic, without obvious abnormality, atraumatic Neck: no adenopathy, no JVD, supple, symmetrical, trachea midline and thyroid not enlarged, symmetric, no tenderness/mass/nodules Lymph nodes: Cervical, supraclavicular, and axillary nodes normal. Resp: clear to auscultation bilaterally Back: symmetric, no curvature. ROM normal. No CVA tenderness. Cardio: regular rate and rhythm, S1, S2 normal, no murmur, click, rub or gallop GI: soft, non-tender; bowel sounds normal; no masses,  no organomegaly Extremities: extremities normal, atraumatic, no cyanosis or edema  ECOG PERFORMANCE STATUS: 1 - Symptomatic but completely ambulatory  Blood pressure (!) 145/89, pulse 100, temperature 97.6 F (36.4 C), temperature source Oral, resp. rate 18, height 5\' 5"  (1.651 m), weight 178 lb 6.4 oz (80.9 kg), SpO2 96 %.  LABORATORY DATA: Lab Results  Component Value Date   WBC 1.6 (L) 01/17/2018   HGB 9.9 (L) 01/17/2018   HCT 31.2 (L) 01/17/2018   MCV 87.9 01/17/2018   PLT 56 (L) 01/17/2018      Chemistry      Component Value Date/Time   NA 139 01/17/2018 0929   K 4.0 01/17/2018 0929     CL 104 01/17/2018 0929   CO2 26 01/17/2018 0929   BUN 15 01/17/2018 0929   CREATININE 1.07 (H) 01/17/2018 0929      Component Value Date/Time   CALCIUM 9.5 01/17/2018 0929   ALKPHOS 54 01/17/2018 0929   AST 14 (L) 01/17/2018 0929   ALT 9 01/17/2018 0929   BILITOT 0.9 01/17/2018 0929       RADIOGRAPHIC STUDIES: No results found.  ASSESSMENT AND PLAN: This is a very pleasant 84 years old white female with stage IIIb non-small cell lung cancer, squamous cell carcinoma.  She is currently undergoing a course of concurrent chemoradiation with weekly carboplatin and paclitaxel status post 5 cycles.  The patient has been tolerating this treatment well except for mild sore throat and odynophagia. She is expected to complete the course of radiotherapy later this week. We will proceed with the last dose of her treatment with reduced dose carboplatin and paclitaxel today. I will see the patient back for  follow-up visit in 4 weeks for evaluation after repeating CT scan of the chest for restaging of her disease. The patient was advised to call immediately if she has any concerning symptoms in the interval. The patient voices understanding of current disease status and treatment options and is in agreement with the current care plan. All questions were answered. The patient knows to call the clinic with any problems, questions or concerns. We can certainly see the patient much sooner if necessary.  I spent 10 minutes counseling the patient face to face. The total time spent in the appointment was 15 minutes.  Disclaimer: This note was dictated with voice recognition software. Similar sounding words can inadvertently be transcribed and may not be corrected upon review.

## 2018-01-17 NOTE — Progress Notes (Signed)
Spoke w/ Dr. Julien Nordmann, SCr has improved since recent treatments, but will not go up on carboplatin dose in setting of poor blood counts. Keep carboplatin dose at 120mg .   Demetrius Charity, PharmD, [facility] Pharmacist Pharmacy Phone: [phone] 01/17/2018

## 2018-01-17 NOTE — Progress Notes (Signed)
CBC and CMET reviewed by MD VO " ok to treat despite labs"

## 2018-01-17 NOTE — Progress Notes (Signed)
Nutrition follow-up completed with patient who is receiving her last chemotherapy for non-small cell lung cancer. Weight is stable and documented as 178.4 pounds. Patient reports she continues to have a poor appetite but forces herself to eat. Constipation has improved. She reports she has changed oral nutrition supplements to ensure [facility] and is trying to drink twice daily.  Nutrition diagnosis: Unintended weight loss improved.  Intervention: Patient was educated to continue Ensure Enlive twice daily between meals.  Provided coupons. Recommended the importance of a bowel regimen to avoid constipation.  Encouraged increased fluid intake. Questions were answered.  Teach back method used.  Contact information has been provided.  Monitoring, evaluation, goals: Patient will tolerate increased calories and protein to minimize weight loss throughout treatment.  Next visit: To be scheduled as needed.  **Disclaimer: This note was dictated with voice recognition software. Similar sounding words can inadvertently be transcribed and this note may contain transcription errors which may not have been corrected upon publication of note.**

## 2018-01-18 ENCOUNTER — Ambulatory Visit
Admission: RE | Admit: 2018-01-18 | Discharge: 2018-01-18 | Disposition: A | Discharge status: Home / Self Care | Payer: Medicare Other | Source: Ambulatory Visit | Attending: Radiation Oncology | Admitting: Radiation Oncology

## 2018-01-18 DIAGNOSIS — Z510 Encounter for antineoplastic radiation therapy: Secondary | ICD-10-CM | POA: Diagnosis not present

## 2018-01-18 DIAGNOSIS — C342 Malignant neoplasm of middle lobe, bronchus or lung: Secondary | ICD-10-CM | POA: Diagnosis not present

## 2018-01-18 DIAGNOSIS — C3491 Malignant neoplasm of unspecified part of right bronchus or lung: Secondary | ICD-10-CM | POA: Diagnosis not present

## 2018-01-19 ENCOUNTER — Encounter: Payer: Self-pay | Admitting: Internal Medicine

## 2018-01-19 ENCOUNTER — Ambulatory Visit
Admission: RE | Admit: 2018-01-19 | Discharge: 2018-01-19 | Disposition: A | Discharge status: Home / Self Care | Payer: Medicare Other | Source: Ambulatory Visit | Attending: Radiation Oncology | Admitting: Radiation Oncology

## 2018-01-19 DIAGNOSIS — C342 Malignant neoplasm of middle lobe, bronchus or lung: Secondary | ICD-10-CM | POA: Diagnosis not present

## 2018-01-19 DIAGNOSIS — Z510 Encounter for antineoplastic radiation therapy: Secondary | ICD-10-CM | POA: Diagnosis not present

## 2018-01-19 DIAGNOSIS — C3491 Malignant neoplasm of unspecified part of right bronchus or lung: Secondary | ICD-10-CM | POA: Diagnosis not present

## 2018-01-19 NOTE — Progress Notes (Signed)
Patient and son came to bring proof of household income for one-time $500 [place]. Patient approved for one-time grant. She has a copy of the approval letter as well as the expense sheet. Along with the outpatient pharmacy information. Explained in detail expenses and how they are covered. Patient has my card for any additional financial questions or concerns.

## 2018-01-20 ENCOUNTER — Ambulatory Visit: Payer: Medicare Other

## 2018-01-20 ENCOUNTER — Ambulatory Visit
Admission: RE | Admit: 2018-01-20 | Discharge: 2018-01-20 | Disposition: A | Discharge status: Home / Self Care | Payer: Medicare Other | Source: Ambulatory Visit | Attending: Radiation Oncology | Admitting: Radiation Oncology

## 2018-01-20 DIAGNOSIS — C3491 Malignant neoplasm of unspecified part of right bronchus or lung: Secondary | ICD-10-CM | POA: Diagnosis not present

## 2018-01-20 DIAGNOSIS — C342 Malignant neoplasm of middle lobe, bronchus or lung: Secondary | ICD-10-CM | POA: Diagnosis not present

## 2018-01-20 DIAGNOSIS — Z510 Encounter for antineoplastic radiation therapy: Secondary | ICD-10-CM | POA: Diagnosis not present

## 2018-01-21 ENCOUNTER — Ambulatory Visit: Payer: Medicare Other

## 2018-01-21 ENCOUNTER — Ambulatory Visit
Admission: RE | Admit: 2018-01-21 | Discharge: 2018-01-21 | Disposition: A | Discharge status: Home / Self Care | Payer: Medicare Other | Source: Ambulatory Visit | Attending: Radiation Oncology | Admitting: Radiation Oncology

## 2018-01-21 ENCOUNTER — Encounter: Payer: Self-pay | Admitting: Radiation Oncology

## 2018-01-21 DIAGNOSIS — C3491 Malignant neoplasm of unspecified part of right bronchus or lung: Secondary | ICD-10-CM | POA: Diagnosis not present

## 2018-01-21 DIAGNOSIS — C342 Malignant neoplasm of middle lobe, bronchus or lung: Secondary | ICD-10-CM | POA: Diagnosis not present

## 2018-01-21 DIAGNOSIS — Z510 Encounter for antineoplastic radiation therapy: Secondary | ICD-10-CM | POA: Diagnosis not present

## 2018-01-24 ENCOUNTER — Ambulatory Visit: Payer: Medicare Other

## 2018-01-24 ENCOUNTER — Other Ambulatory Visit: Payer: Medicare Other

## 2018-01-27 NOTE — Progress Notes (Signed)
  Radiation Oncology         [phone]) [phone] ________________________________  Name: Lisa [name] MRN: [id number]  Date: 01/21/2018  DOB: 1933/01/10  End of Treatment Note  Diagnosis:   84 y.o. female with Stage III-B (T3, N3, M0) Squamous Cell Carcinoma of the Right Middle Lobe     Indication for treatment:  Curative       Radiation treatment dates:   12/14/2017 - 01/21/2018  Site/dose:   Right Lung / 56 Gy in 28 fractions  Beams/energy:   3D / 6X, 10X Photon  Narrative: The patient tolerated radiation treatment relatively well.  She experienced moderate fatigue and poor appetite. She denies any coughing, shortness of breath, or difficulty with swallowing. Towards the end of treatment, the anterior and posterior chest showed radiation dermatitis with mild skin breakdown. She was given Silvadene to place on the areas with dermatitis and impending moist desquamation.   Plan: The patient has completed radiation treatment. The patient will return to radiation oncology clinic for routine followup in one month. I advised them to call or return sooner if they have any questions or concerns related to their recovery or treatment.  -----------------------------------  Blair Promise, PhD, MD  This document serves as a record of services personally performed by Gery Pray, MD. It was created on his behalf by Rae Lips, a trained medical scribe. The creation of this record is based on the scribe's personal observations and the provider's statements to them. This document has been checked and approved by the attending provider.

## 2018-02-14 ENCOUNTER — Inpatient Hospital Stay: Payer: Medicare Other | Attending: Internal Medicine

## 2018-02-14 ENCOUNTER — Encounter (HOSPITAL_COMMUNITY): Payer: Self-pay | Admitting: Radiology

## 2018-02-14 ENCOUNTER — Ambulatory Visit (HOSPITAL_COMMUNITY)
Admission: RE | Admit: 2018-02-14 | Discharge: 2018-02-14 | Disposition: A | Discharge status: Home / Self Care | Payer: Medicare Other | Source: Ambulatory Visit | Attending: Internal Medicine | Admitting: Internal Medicine

## 2018-02-14 DIAGNOSIS — C3490 Malignant neoplasm of unspecified part of unspecified bronchus or lung: Secondary | ICD-10-CM | POA: Insufficient documentation

## 2018-02-14 DIAGNOSIS — R070 Pain in throat: Secondary | ICD-10-CM | POA: Insufficient documentation

## 2018-02-14 DIAGNOSIS — I700 Atherosclerosis of aorta: Secondary | ICD-10-CM | POA: Insufficient documentation

## 2018-02-14 DIAGNOSIS — C342 Malignant neoplasm of middle lobe, bronchus or lung: Secondary | ICD-10-CM | POA: Insufficient documentation

## 2018-02-14 DIAGNOSIS — I10 Essential (primary) hypertension: Secondary | ICD-10-CM | POA: Insufficient documentation

## 2018-02-14 DIAGNOSIS — R918 Other nonspecific abnormal finding of lung field: Secondary | ICD-10-CM | POA: Insufficient documentation

## 2018-02-14 DIAGNOSIS — I712 Thoracic aortic aneurysm, without rupture: Secondary | ICD-10-CM | POA: Diagnosis not present

## 2018-02-14 DIAGNOSIS — R5383 Other fatigue: Secondary | ICD-10-CM | POA: Diagnosis not present

## 2018-02-14 DIAGNOSIS — I2510 Atherosclerotic heart disease of native coronary artery without angina pectoris: Secondary | ICD-10-CM | POA: Insufficient documentation

## 2018-02-14 DIAGNOSIS — J432 Centrilobular emphysema: Secondary | ICD-10-CM | POA: Diagnosis not present

## 2018-02-14 DIAGNOSIS — Z79899 Other long term (current) drug therapy: Secondary | ICD-10-CM | POA: Diagnosis not present

## 2018-02-14 DIAGNOSIS — Z85820 Personal history of malignant melanoma of skin: Secondary | ICD-10-CM | POA: Insufficient documentation

## 2018-02-14 DIAGNOSIS — E785 Hyperlipidemia, unspecified: Secondary | ICD-10-CM | POA: Diagnosis not present

## 2018-02-14 DIAGNOSIS — E039 Hypothyroidism, unspecified: Secondary | ICD-10-CM | POA: Insufficient documentation

## 2018-02-14 DIAGNOSIS — J438 Other emphysema: Secondary | ICD-10-CM | POA: Insufficient documentation

## 2018-02-14 LAB — CBC WITH DIFFERENTIAL (CANCER CENTER ONLY)
Abs Immature Granulocytes: 0.06 10*3/uL (ref 0.00–0.07)
Basophils Absolute: 0 10*3/uL (ref 0.0–0.1)
Basophils Relative: 0 %
Eosinophils Absolute: 0 10*3/uL (ref 0.0–0.5)
Eosinophils Relative: 1 %
HCT: 28.7 % — ABNORMAL LOW (ref 36.0–46.0)
Hemoglobin: 9.1 g/dL — ABNORMAL LOW (ref 12.0–15.0)
Immature Granulocytes: 1 %
Lymphocytes Relative: 14 %
Lymphs Abs: 1 10*3/uL (ref 0.7–4.0)
MCH: 28.8 pg (ref 26.0–34.0)
MCHC: 31.7 g/dL (ref 30.0–36.0)
MCV: 90.8 fL (ref 80.0–100.0)
Monocytes Absolute: 0.7 10*3/uL (ref 0.1–1.0)
Monocytes Relative: 10 %
Neutro Abs: 5.3 10*3/uL (ref 1.7–7.7)
Neutrophils Relative %: 74 %
Platelet Count: 150 10*3/uL (ref 150–400)
RBC: 3.16 MIL/uL — ABNORMAL LOW (ref 3.87–5.11)
RDW: 18.8 % — ABNORMAL HIGH (ref 11.5–15.5)
WBC Count: 7.1 10*3/uL (ref 4.0–10.5)
nRBC: 0 % (ref 0.0–0.2)

## 2018-02-14 LAB — CMP (CANCER CENTER ONLY)
ALT: 7 U/L (ref 0–44)
AST: 15 U/L (ref 15–41)
Albumin: 3 g/dL — ABNORMAL LOW (ref 3.5–5.0)
Alkaline Phosphatase: 57 U/L (ref 38–126)
Anion gap: 9 (ref 5–15)
BUN: 17 mg/dL (ref 8–23)
CO2: 27 mmol/L (ref 22–32)
Calcium: 9.4 mg/dL (ref 8.9–10.3)
Chloride: 101 mmol/L (ref 98–111)
Creatinine: 1.24 mg/dL — ABNORMAL HIGH (ref 0.44–1.00)
GFR, Est AFR Am: 45 mL/min — ABNORMAL LOW (ref >60)
GFR, Estimated: 39 mL/min — ABNORMAL LOW (ref >60)
Glucose, Bld: 92 mg/dL (ref 70–99)
Potassium: 3.2 mmol/L — ABNORMAL LOW (ref 3.5–5.1)
Sodium: 137 mmol/L (ref 135–145)
Total Bilirubin: 0.7 mg/dL (ref 0.3–1.2)
Total Protein: 6.6 g/dL (ref 6.5–8.1)

## 2018-02-14 MED ORDER — IOHEXOL 300 MG/ML  SOLN
75.0000 mL | Freq: Once | INTRAMUSCULAR | Status: AC | PRN
  Administered 2018-02-14: 75 mL via INTRAVENOUS

## 2018-02-14 MED ORDER — SODIUM CHLORIDE (PF) 0.9 % IJ SOLN
INTRAMUSCULAR | Status: AC
  Filled 2018-02-14: qty 50

## 2018-02-16 ENCOUNTER — Other Ambulatory Visit: Payer: Self-pay | Admitting: Internal Medicine

## 2018-02-16 ENCOUNTER — Telehealth: Payer: Self-pay

## 2018-02-16 ENCOUNTER — Inpatient Hospital Stay (HOSPITAL_BASED_OUTPATIENT_CLINIC_OR_DEPARTMENT_OTHER): Payer: Medicare Other | Admitting: Oncology

## 2018-02-16 ENCOUNTER — Encounter: Payer: Self-pay | Admitting: Oncology

## 2018-02-16 VITALS — BP 115/75 | HR 108 | Temp 97.8°F | Resp 16 | Ht 65.0 in | Wt 164.2 lb

## 2018-02-16 DIAGNOSIS — C342 Malignant neoplasm of middle lobe, bronchus or lung: Secondary | ICD-10-CM

## 2018-02-16 DIAGNOSIS — C3491 Malignant neoplasm of unspecified part of right bronchus or lung: Secondary | ICD-10-CM

## 2018-02-16 DIAGNOSIS — Z85820 Personal history of malignant melanoma of skin: Secondary | ICD-10-CM

## 2018-02-16 DIAGNOSIS — I10 Essential (primary) hypertension: Secondary | ICD-10-CM | POA: Diagnosis not present

## 2018-02-16 DIAGNOSIS — R070 Pain in throat: Secondary | ICD-10-CM | POA: Diagnosis not present

## 2018-02-16 DIAGNOSIS — Z79899 Other long term (current) drug therapy: Secondary | ICD-10-CM

## 2018-02-16 DIAGNOSIS — E785 Hyperlipidemia, unspecified: Secondary | ICD-10-CM | POA: Diagnosis not present

## 2018-02-16 DIAGNOSIS — R5383 Other fatigue: Secondary | ICD-10-CM | POA: Diagnosis not present

## 2018-02-16 DIAGNOSIS — E039 Hypothyroidism, unspecified: Secondary | ICD-10-CM

## 2018-02-16 DIAGNOSIS — Z5112 Encounter for antineoplastic immunotherapy: Secondary | ICD-10-CM | POA: Insufficient documentation

## 2018-02-16 DIAGNOSIS — Z7189 Other specified counseling: Secondary | ICD-10-CM

## 2018-02-16 NOTE — Patient Instructions (Signed)
Durvalumab injection  What is this medicine?  DURVALUMAB (dur VAL ue mab) is a monoclonal antibody. It is used to treat urothelial cancer.  This medicine may be used for other purposes; ask your health care provider or pharmacist if you have questions.  COMMON BRAND NAME(S): [id number]  What should I tell my health care provider before I take this medicine?  They need to know if you have any of these conditions:  -diabetes  -immune system problems  -infection  -inflammatory bowel disease  -kidney disease  -liver disease  -lung or breathing disease  -lupus  -organ transplant  -stomach or intestine problems  -thyroid disease  -an unusual or allergic reaction to durvalumab, other medicines, foods, dyes, or preservatives  -pregnant or trying to get pregnant  -breast-feeding  How should I use this medicine?  This medicine is for infusion into a vein. It is given by a health care professional in a hospital or clinic setting.  A special MedGuide will be given to you before each treatment. Be sure to read this information carefully each time.  Talk to your pediatrician regarding the use of this medicine in children. Special care may be needed.  Overdosage: If you think you have taken too much of this medicine contact a poison control center or emergency room at once.  NOTE: This medicine is only for you. Do not share this medicine with others.  What if I miss a dose?  It is important not to miss your dose. Call your doctor or health care professional if you are unable to keep an appointment.  What may interact with this medicine?  Interactions have not been studied.  This list may not describe all possible interactions. Give your health care provider a list of all the medicines, herbs, non-prescription drugs, or dietary supplements you use. Also tell them if you smoke, drink alcohol, or use illegal drugs. Some items may interact with your medicine.  What should I watch for while using this medicine?  This drug may make you  feel generally unwell. Continue your course of treatment even though you feel ill unless your doctor tells you to stop.  You may need blood work done while you are taking this medicine.  Do not become pregnant while taking this medicine or for 3 months after stopping it. Women should inform their doctor if they wish to become pregnant or think they might be pregnant. There is a potential for serious side effects to an unborn child. Talk to your health care professional or pharmacist for more information. Do not breast-feed an infant while taking this medicine or for 3 months after stopping it.  What side effects may I notice from receiving this medicine?  Side effects that you should report to your doctor or health care professional as soon as possible:  -allergic reactions like skin rash, itching or hives, swelling of the face, lips, or tongue  -black, tarry stools  -bloody or watery diarrhea  -breathing problems  -change in emotions or moods  -change in sex drive  -changes in vision  -chest pain or chest tightness  -chills  -confusion  -cough  -facial flushing  -fever  -headache  -signs and symptoms of high blood sugar such as dizziness; dry mouth; dry skin; fruity breath; nausea; stomach pain; increased hunger or thirst; increased urination  -signs and symptoms of liver injury like dark yellow or brown urine; general ill feeling or flu-like symptoms; light-colored stools; loss of appetite; nausea; right upper belly pain;   unusually weak or tired; yellowing of the eyes or skin  -stomach pain  -trouble passing urine or change in the amount of urine  -weight gain or weight loss  Side effects that usually do not require medical attention (report these to your doctor or health care professional if they continue or are bothersome):  -bone pain  -constipation  -loss of appetite  -muscle pain  -nausea  -swelling of the ankles, feet, hands  -tiredness  This list may not describe all possible side effects. Call your doctor  for medical advice about side effects. You may report side effects to FDA at 1-800-FDA-1088.  Where should I keep my medicine?  This drug is given in a hospital or clinic and will not be stored at home.  NOTE: This sheet is a summary. It may not cover all possible information. If you have questions about this medicine, talk to your doctor, pharmacist, or health care provider.  © 2018 [employer] (2015-10-18 15:50:36)

## 2018-02-16 NOTE — Telephone Encounter (Signed)
Printed avs and calender of upcoming appointment. Per 11/20 los

## 2018-02-16 NOTE — Progress Notes (Signed)
[facility] OFFICE PROGRESS NOTE  [name], MD [place] [address]  DIAGNOSIS: Stage IIIb (T3, N3, M0) non-small cell lung cancer, squamous cell carcinoma presented with large right middle lobe lung mass in addition to right hilar, subcarinal and right supraclavicular lymphadenopathy diagnosed in August 2019.  PRIOR THERAPY: Concurrent chemoradiation with weekly carboplatin for AUC of 2 and paclitaxel 45 NG/M2.  Status post 6 cycles.  CURRENT THERAPY: Consolidation therapy with Imfinzi 10 mg/kg every 2 weeks.  First dose expected on 02/28/2018.  INTERVAL HISTORY: Lisa [name] 84 y.o. female returns for routine follow-up visit accompanied by her granddaughter.  The patient is feeling fine today with the exception of fatigue.  She reports that she does not leave the house very much due to her fatigue.  Her odynophagia has resolved.  She is eating better.  Denies fevers and chills.  Denies chest pain, shortness of breath at rest, cough, hemoptysis.  Reports shortness of breath with exertion.  Denies nausea, vomiting, constipation, diarrhea.  The patient had a restaging CT scan of the chest and is here to discuss her treatment options.  MEDICAL HISTORY: Past Medical History:  Diagnosis Date  . Diverticulosis   . Headache   . Hemorrhoids    internal and external  . Hyperlipidemia 01/29/1991  . Hypertension 10/29/1995  . Hypothyroidism 10/29/1995  . Lung mass    right middle lobe  . Melanoma ([place])    h/o, local excision. no chemo or rady.   . Skin cancer (melanoma) ([place])   . Tubular adenoma of colon   . Wears dentures     ALLERGIES:  is allergic to atorvastatin; celebrex [celecoxib]; cholecalciferol; and simvastatin.  MEDICATIONS:  Current Outpatient Medications  Medication Sig Dispense Refill  . amLODipine (NORVASC) 10 MG tablet Take 1 tablet (10 mg total) by mouth daily. 90 tablet 3  . Cholecalciferol (VITAMIN D) 2000 units tablet  Take 2,000 Units by mouth 2 (two) times daily.    . Coenzyme Q10 100 MG capsule Take 100 mg by mouth daily.     Marland Kitchen docusate sodium (COLACE) 100 MG capsule Take 100 mg by mouth daily as needed for mild constipation.    . fluticasone (FLONASE) 50 MCG/ACT nasal spray USE 2 SPRAYS IN EACH NOSTRIL DAILY AS NEEDED FOR RHINITIS 48 g 3  . levothyroxine (SYNTHROID, LEVOTHROID) 75 MCG tablet Take 1 tablet (75 mcg total) by mouth daily. 90 tablet 3  . losartan-hydrochlorothiazide (HYZAAR) 100-25 MG tablet Take 1 tablet by mouth daily. 90 tablet 3  . Melatonin 3 MG TABS Take 1 tablet by mouth at bedtime as needed.    . potassium chloride SA (K-DUR,KLOR-CON) 20 MEQ tablet Take 1 tablet (20 mEq total) by mouth 2 (two) times daily. 7 tablet 0  . prochlorperazine (COMPAZINE) 10 MG tablet Take 1 tablet (10 mg total) by mouth every 6 (six) hours as needed for nausea or vomiting. (Patient not taking: Reported on 01/03/2018) 30 tablet 0  . sennosides-docusate sodium (SENOKOT-S) 8.6-50 MG tablet Take 1 tablet by mouth daily as needed for constipation.    . sucralfate (CARAFATE) 1 g tablet Take 1 tablet (1 g total) by mouth 4 (four) times daily -  with meals and at bedtime. Crush and dissolve in 1 tablespoon of water prior to swallowing (Patient not taking: Reported on 02/16/2018) 30 tablet 2   No current facility-administered medications for this visit.    Facility-Administered Medications Ordered in Other Visits  Medication Dose Route Frequency Provider Last Rate Last Dose  . acetaminophen (TYLENOL) tablet 650 mg  650 mg Oral Once Nicholas Lose, MD        SURGICAL HISTORY:  Past Surgical History:  Procedure Laterality Date  . BRONCHIAL BIOPSY  11/23/2017   Procedure: BRONCHIAL BIOPSIES;  Surgeon: Grace Isaac, MD;  Location: [facility] OR;  Service: Thoracic;;  . cataract surgery  2007, 2008   repair bilat  . DG BARIUM ENEMA ([facility]) N/A 2016   [name]  . HERNIA REPAIR  04/25/2001  . IR US GUIDE BX  ASP/DRAIN  11/17/2017  . KNEE SURGERY     meniscus tear per Dr. Ronnie Derby, R knee  . MULTIPLE TOOTH EXTRACTIONS    . SEPTOPLASTY  2006   Dr. Ernesto Rutherford  . SKIN CANCER EXCISION    . TONSILLECTOMY  1954  . VAGINAL HYSTERECTOMY    . VIDEO BRONCHOSCOPY WITH ENDOBRONCHIAL ULTRASOUND N/A 11/23/2017   Procedure: VIDEO BRONCHOSCOPY WITH ENDOBRONCHIAL ULTRASOUND;  Surgeon: Grace Isaac, MD;  Location: [facility];  Service: Thoracic;  Laterality: N/A;    REVIEW OF SYSTEMS:   Review of Systems  Constitutional: Negative for appetite change, chills, fever and unexpected weight change.  Positive for fatigue. HENT:   Negative for mouth sores, nosebleeds, sore throat and trouble swallowing.   Eyes: Negative for eye problems and icterus.  Respiratory: Negative for cough, hemoptysis, shortness of breath at rest and wheezing.  Positive for shortness of breath with exertion. Cardiovascular: Negative for chest pain and leg swelling.  Gastrointestinal: Negative for abdominal pain, constipation, diarrhea, nausea and vomiting.  Genitourinary: Negative for bladder incontinence, difficulty urinating, dysuria, frequency and hematuria.   Musculoskeletal: Negative for back pain, gait problem, neck pain and neck stiffness.  Skin: Negative for itching and rash.  Neurological: Negative for dizziness, extremity weakness, gait problem, headaches, light-headedness and seizures.  Hematological: Negative for adenopathy. Does not bruise/bleed easily.  Psychiatric/Behavioral: Negative for confusion, depression and sleep disturbance. The patient is not nervous/anxious.     PHYSICAL EXAMINATION:  Blood pressure 115/75, pulse (!) 108, temperature 97.8 F (36.6 C), temperature source Oral, resp. rate 16, height 5\' 5"  (1.651 m), weight 164 lb 3.2 oz (74.5 kg), SpO2 100 %.  ECOG PERFORMANCE STATUS: 1 - Symptomatic but completely ambulatory  Physical Exam  Constitutional: Oriented to person, place, and time and well-developed,  well-nourished, and in no distress. No distress.  HENT:  Head: Normocephalic and atraumatic.  Mouth/Throat: Oropharynx is clear and moist. No oropharyngeal exudate.  Eyes: Conjunctivae are normal. Right eye exhibits no discharge. Left eye exhibits no discharge. No scleral icterus.  Neck: Normal range of motion. Neck supple.  Cardiovascular: Normal rate, regular rhythm, normal heart sounds and intact distal pulses.   Pulmonary/Chest: Effort normal and breath sounds normal. No respiratory distress. No wheezes. No rales.  Abdominal: Soft. Bowel sounds are normal. Exhibits no distension and no mass. There is no tenderness.  Musculoskeletal: Normal range of motion. Exhibits no edema.  Lymphadenopathy:    No cervical adenopathy.  Neurological: Alert and oriented to person, place, and time. Exhibits normal muscle tone. Gait normal. Coordination normal.  Skin: Skin is warm and dry. No rash noted. Not diaphoretic. No erythema. No pallor.  Psychiatric: Mood, memory and judgment normal.  Vitals reviewed.  LABORATORY DATA: Lab Results  Component Value Date   WBC 7.1 02/14/2018   HGB 9.1 (L) 02/14/2018   HCT 28.7 (L) 02/14/2018   MCV 90.8 02/14/2018   PLT 150  02/14/2018      Chemistry      Component Value Date/Time   NA 137 02/14/2018 1001   K 3.2 (L) 02/14/2018 1001   CL 101 02/14/2018 1001   CO2 27 02/14/2018 1001   BUN 17 02/14/2018 1001   CREATININE 1.24 (H) 02/14/2018 1001      Component Value Date/Time   CALCIUM 9.4 02/14/2018 1001   ALKPHOS 57 02/14/2018 1001   AST 15 02/14/2018 1001   ALT 7 02/14/2018 1001   BILITOT 0.7 02/14/2018 1001       RADIOGRAPHIC STUDIES:  Ct Chest W Contrast  Result Date: 02/14/2018 CLINICAL DATA:  Right lung cancer, radiation therapy completed chemotherapy. Weight loss. Remote history of melanoma. EXAM: CT CHEST WITH CONTRAST TECHNIQUE: Multidetector CT imaging of the chest was performed during intravenous contrast administration. CONTRAST:   47mL OMNIPAQUE IOHEXOL 300 MG/ML  SOLN COMPARISON:  PET 11/08/2017 and CT chest 10/26/2017. FINDINGS: Cardiovascular: Atherosclerotic calcification of the arterial vasculature, including coronary arteries. Ascending aorta measures 5.0 cm. Heart is at the upper limits of normal in size. No pericardial effusion. Mediastinum/Nodes: 8 mm short axis right supraclavicular lymph node is stable. Low right paratracheal lymph node measures 9 mm, stable. There are subcentimeter short axis right hilar lymph nodes. No left hilar or axillary adenopathy. There may be distal esophageal wall thickening which can be seen with gastroesophageal reflux. Lungs/Pleura: Mild centrilobular and paraseptal emphysema. Peribronchovascular nodularity and mucoid impaction in the anterior segment right upper lobe, new. Right middle lobe mass exhibits minimal residual cavitation and measures approximately 3.2 x 4.2 cm, compared to 5.5 x 6.0 cm. Associated narrowing of the right middle lobe bronchus and volume loss in the lateral segment right middle lobe, as before. New areas of consolidation and necrosis extend from the right hilum along the right major fissure and into the right lower lobe. 3 mm right upper lobe nodule (series 5, image 70), unchanged. 3 mm left lower lobe nodule (image 89), stable. No pleural fluid. Airway is otherwise unremarkable. Upper Abdomen: Subcentimeter low-attenuation lesions in the liver are too small to characterize. Visualized portions of the liver, gallbladder and adrenal glands are otherwise unremarkable. Numerous low-attenuation lesions in the kidneys, measuring up to 1.9 cm on the right, the majority of which are poorly characterized due to size. Visualized portions of the spleen, pancreas, stomach and bowel are grossly unremarkable. No upper abdominal adenopathy. Musculoskeletal: Degenerative changes in the spine. Old right rib fracture. No worrisome lytic or sclerotic lesions. Slight compression of the T12  inferior endplate is unchanged. IMPRESSION: 1. Interval decrease in size of a cavitary mass in the right middle lobe. New areas of consolidation/necrosis along the right major fissure and right lower lobe may be related to radiation therapy. An infectious process or disease spread cannot be excluded. 2. New peribronchovascular nodularity and mucoid impaction in the right upper lobe, indicative of an infectious bronchiolitis. 3. Ascending Aortic aneurysm NOS (ICD10-I71.9). Ascending thoracic aortic aneurysm. Recommend semi-annual imaging followup by CTA or MRA and referral to cardiothoracic surgery if not already obtained. This recommendation follows 2010 ACCF/AHA/AATS/ACR/ASA/SCA/SCAI/SIR/STS/SVM Guidelines for the Diagnosis and Management of Patients With Thoracic Aortic Disease. Circulation. 2010; 121: W102-V253. 4. Aortic atherosclerosis (ICD10-170.0). Coronary artery calcification. 5.  Emphysema (ICD10-J43.9). Electronically Signed   By: Lorin Picket M.D.   On: 02/14/2018 11:24     ASSESSMENT/PLAN:  Stage III squamous cell carcinoma of right lung [facility]) This is a very pleasant 84 year old white female with stage IIIb non-small  cell lung cancer, squamous cell carcinoma.  She is currently undergoing a course of concurrent chemoradiation with weekly carboplatin and paclitaxel status post 6 cycles. The patient tolerated this treatment well except for mild sore throat and odynophagia.  She had a recent restaging CT scan of the chest and is here to discuss the results.  The patient was seen with Dr. Julien Nordmann.  CT scan results were discussed with the patient and granddaughter.  We discussed that the mass in the right middle lobe has responded to therapy and is smaller in size. Discussed with the patient options for management of her condition at this point including close observation and monitoring versus consolidation treatment with immunotherapy with Imfinzi (Durvalumab) 10 MG/KG every 2 weeks for a total of  one year unless the patient developed toxicity or disease progression. The patient is interested in the treatment with consolidation Imfinzi (Durvalumab).Discussed with her the adverse effect of this treatment including but not limited to immunotherapy mediated skin rash, diarrhea, or inflammation of the lung, kidney, liver, thyroid or other endocrine dysfunction including type 1 diabetes mellitus. The patient would like to proceed with treatment as planned and she is expected to start the first cycle of this treatment on 02/28/2018.  We will see her back for follow-up in approximately 3 weeks for evaluation prior to cycle #2.  The patient was advised to call immediately if she has any concerning symptoms in the interval. The patient voices understanding of current disease status and treatment options and is in agreement with the current care plan. All questions were answered. The patient knows to call the clinic with any problems, questions or concerns. We can certainly see the patient much sooner if necessary.   Orders Placed This Encounter  Procedures  . CBC with Differential (Cancer Center Only)    Standing Status:   Standing    Number of Occurrences:   20    Standing Expiration Date:   02/17/2019  . CMP ([facility] only)    Standing Status:   Standing    Number of Occurrences:   20    Standing Expiration Date:   02/17/2019  . TSH    Standing Status:   Standing    Number of Occurrences:   12    Standing Expiration Date:   02/17/2019     Mikey Bussing, DNP, AGPCNP-BC, AOCNP 02/16/18   ADDENDUM: Hematology/Oncology Attending: I had a face-to-face encounter with the patient today.  I recommended her care plan.  This is a very pleasant 84 years old white female with a stage IIIb non-small cell lung cancer, squamous cell carcinoma.  The patient recently completed a course of concurrent chemoradiation with weekly carboplatin and paclitaxel.  She tolerated this treatment well except for  persistent fatigue.  She had a repeat CT scan of the chest performed recently.  I personally and independently reviewed the scan images and discussed the result with the patient and her granddaughter today. Her scan showed partial response with improvement in the left lung mass and stable mediastinal lymphadenopathy. I discussed with the patient her treatment options including continuous observation and monitoring versus consideration of treatment with consolidation immunotherapy with Imfinzi (Durvalumab) 10 mg/KG every 2 weeks for a total of 1 year if the patient has no evidence for disease progression or unacceptable toxicity.  I discussed with the patient the adverse effect of the immunotherapy including but not limited to immunotherapy mediated skin rash, diarrhea, inflammation of the lung, kidney, liver, thyroid or other endocrine  dysfunction. The patient is interested in proceeding with the immunotherapy and she is expected to start the first dose of this treatment the week after Thanksgiving. The patient will come back for follow-up visit in 4 weeks with the start of cycle #2. The patient was advised to call immediately if she has any concerning symptoms in the interval.  Disclaimer: This note was dictated with voice recognition software. Similar sounding words can inadvertently be transcribed and may be missed upon review. Eilleen Kempf, MD 02/16/18

## 2018-02-16 NOTE — Assessment & Plan Note (Signed)
This is a very pleasant 84 year old white female with stage IIIb non-small cell lung cancer, squamous cell carcinoma.  She is currently undergoing a course of concurrent chemoradiation with weekly carboplatin and paclitaxel status post 6 cycles. The patient tolerated this treatment well except for mild sore throat and odynophagia.  She had a recent restaging CT scan of the chest and is here to discuss the results.  The patient was seen with Dr. Julien Nordmann.  CT scan results were discussed with the patient and granddaughter.  We discussed that the mass in the right middle lobe has responded to therapy and is smaller in size. Discussed with the patient options for management of her condition at this point including close observation and monitoring versus consolidation treatment with immunotherapy with Imfinzi (Durvalumab) 10 MG/KG every 2 weeks for a total of one year unless the patient developed toxicity or disease progression. The patient is interested in the treatment with consolidation Imfinzi (Durvalumab).Discussed with her the adverse effect of this treatment including but not limited to immunotherapy mediated skin rash, diarrhea, or inflammation of the lung, kidney, liver, thyroid or other endocrine dysfunction including type 1 diabetes mellitus. The patient would like to proceed with treatment as planned and she is expected to start the first cycle of this treatment on 02/28/2018.  We will see her back for follow-up in approximately 3 weeks for evaluation prior to cycle #2.  The patient was advised to call immediately if she has any concerning symptoms in the interval. The patient voices understanding of current disease status and treatment options and is in agreement with the current care plan. All questions were answered. The patient knows to call the clinic with any problems, questions or concerns. We can certainly see the patient much sooner if necessary.

## 2018-02-16 NOTE — Progress Notes (Signed)
DISCONTINUE ON PATHWAY REGIMEN - Non-Small Cell Lung     Administer weekly:     Paclitaxel      Carboplatin   **Always confirm dose/schedule in your pharmacy ordering system**  REASON: Continuation Of Treatment PRIOR TREATMENT: [id number]: Carboplatin AUC=2 + Paclitaxel 45 mg/m2 Weekly During Radiation TREATMENT RESPONSE: Partial Response (PR)  START ON PATHWAY REGIMEN - Non-Small Cell Lung     A cycle is every 14 days:     Durvalumab   **Always confirm dose/schedule in your pharmacy ordering system**  Patient Characteristics: Stage III - Unresectable, PS = 0, 1 AJCC T Category: T3 Current Disease Status: No Distant Mets or Local Recurrence AJCC N Category: N3 AJCC M Category: M0 AJCC 8 Stage Grouping: IIIC Performance Status: PS = 0, 1 Intent of Therapy: Curative Intent, Discussed with Patient

## 2018-02-17 ENCOUNTER — Telehealth: Payer: Self-pay | Admitting: Oncology

## 2018-02-17 NOTE — Telephone Encounter (Signed)
R/s appt per 11/21 sch message - pt is aware of appt date and time.

## 2018-03-04 ENCOUNTER — Inpatient Hospital Stay: Payer: Medicare Other

## 2018-03-04 ENCOUNTER — Inpatient Hospital Stay: Payer: Medicare Other | Attending: Internal Medicine

## 2018-03-04 VITALS — BP 120/71 | HR 88 | Temp 98.0°F | Resp 14

## 2018-03-04 DIAGNOSIS — Z5112 Encounter for antineoplastic immunotherapy: Secondary | ICD-10-CM | POA: Insufficient documentation

## 2018-03-04 DIAGNOSIS — R630 Anorexia: Secondary | ICD-10-CM | POA: Insufficient documentation

## 2018-03-04 DIAGNOSIS — C3491 Malignant neoplasm of unspecified part of right bronchus or lung: Secondary | ICD-10-CM

## 2018-03-04 DIAGNOSIS — E785 Hyperlipidemia, unspecified: Secondary | ICD-10-CM | POA: Diagnosis not present

## 2018-03-04 DIAGNOSIS — Z9221 Personal history of antineoplastic chemotherapy: Secondary | ICD-10-CM | POA: Diagnosis not present

## 2018-03-04 DIAGNOSIS — Z803 Family history of malignant neoplasm of breast: Secondary | ICD-10-CM | POA: Diagnosis not present

## 2018-03-04 DIAGNOSIS — Z79899 Other long term (current) drug therapy: Secondary | ICD-10-CM | POA: Diagnosis not present

## 2018-03-04 DIAGNOSIS — I10 Essential (primary) hypertension: Secondary | ICD-10-CM | POA: Diagnosis not present

## 2018-03-04 DIAGNOSIS — E039 Hypothyroidism, unspecified: Secondary | ICD-10-CM | POA: Insufficient documentation

## 2018-03-04 DIAGNOSIS — C342 Malignant neoplasm of middle lobe, bronchus or lung: Secondary | ICD-10-CM | POA: Insufficient documentation

## 2018-03-04 DIAGNOSIS — R5383 Other fatigue: Secondary | ICD-10-CM | POA: Diagnosis not present

## 2018-03-04 DIAGNOSIS — Z87891 Personal history of nicotine dependence: Secondary | ICD-10-CM | POA: Diagnosis not present

## 2018-03-04 DIAGNOSIS — Z85820 Personal history of malignant melanoma of skin: Secondary | ICD-10-CM | POA: Insufficient documentation

## 2018-03-04 DIAGNOSIS — R413 Other amnesia: Secondary | ICD-10-CM | POA: Insufficient documentation

## 2018-03-04 LAB — CBC WITH DIFFERENTIAL (CANCER CENTER ONLY)
Abs Immature Granulocytes: 0.02 10*3/uL (ref 0.00–0.07)
Basophils Absolute: 0 10*3/uL (ref 0.0–0.1)
Basophils Relative: 1 %
Eosinophils Absolute: 0.1 10*3/uL (ref 0.0–0.5)
Eosinophils Relative: 3 %
HCT: 32 % — ABNORMAL LOW (ref 36.0–46.0)
Hemoglobin: 10.2 g/dL — ABNORMAL LOW (ref 12.0–15.0)
Immature Granulocytes: 0 %
Lymphocytes Relative: 26 %
Lymphs Abs: 1.3 10*3/uL (ref 0.7–4.0)
MCH: 28.9 pg (ref 26.0–34.0)
MCHC: 31.9 g/dL (ref 30.0–36.0)
MCV: 90.7 fL (ref 80.0–100.0)
Monocytes Absolute: 0.5 10*3/uL (ref 0.1–1.0)
Monocytes Relative: 9 %
Neutro Abs: 3 10*3/uL (ref 1.7–7.7)
Neutrophils Relative %: 61 %
Platelet Count: 151 10*3/uL (ref 150–400)
RBC: 3.53 MIL/uL — ABNORMAL LOW (ref 3.87–5.11)
RDW: 17.7 % — ABNORMAL HIGH (ref 11.5–15.5)
WBC Count: 5 10*3/uL (ref 4.0–10.5)
nRBC: 0 % (ref 0.0–0.2)

## 2018-03-04 LAB — CMP (CANCER CENTER ONLY)
ALT: 12 U/L (ref 0–44)
AST: 21 U/L (ref 15–41)
Albumin: 3.3 g/dL — ABNORMAL LOW (ref 3.5–5.0)
Alkaline Phosphatase: 80 U/L (ref 38–126)
Anion gap: 13 (ref 5–15)
BUN: 20 mg/dL (ref 8–23)
CO2: 24 mmol/L (ref 22–32)
Calcium: 10.2 mg/dL (ref 8.9–10.3)
Chloride: 105 mmol/L (ref 98–111)
Creatinine: 1.49 mg/dL — ABNORMAL HIGH (ref 0.44–1.00)
GFR, Est AFR Am: 37 mL/min — ABNORMAL LOW (ref >60)
GFR, Estimated: 32 mL/min — ABNORMAL LOW (ref >60)
Glucose, Bld: 83 mg/dL (ref 70–99)
Potassium: 3.3 mmol/L — ABNORMAL LOW (ref 3.5–5.1)
Sodium: 142 mmol/L (ref 135–145)
Total Bilirubin: 0.6 mg/dL (ref 0.3–1.2)
Total Protein: 7.1 g/dL (ref 6.5–8.1)

## 2018-03-04 LAB — TSH: TSH: 0.652 u[IU]/mL (ref 0.308–3.960)

## 2018-03-04 MED ORDER — SODIUM CHLORIDE 0.9 % IV SOLN
10.0000 mg/kg | Freq: Once | INTRAVENOUS | Status: AC
  Administered 2018-03-04: 740 mg via INTRAVENOUS
  Filled 2018-03-04: qty 10

## 2018-03-04 MED ORDER — SODIUM CHLORIDE 0.9 % IV SOLN
Freq: Once | INTRAVENOUS | Status: AC
  Administered 2018-03-04: 09:00:00 via INTRAVENOUS
  Filled 2018-03-04: qty 250

## 2018-03-04 NOTE — Patient Instructions (Signed)
[facility] Discharge Instructions for Patients Receiving Chemotherapy  Today you received the following chemotherapy agents Durvalumab.  To help prevent nausea and vomiting after your treatment, we encourage you to take your nausea medication.   If you develop nausea and vomiting that is not controlled by your nausea medication, call the clinic.   BELOW ARE SYMPTOMS THAT SHOULD BE REPORTED IMMEDIATELY:  *FEVER GREATER THAN 100.5 F  *CHILLS WITH OR WITHOUT FEVER  NAUSEA AND VOMITING THAT IS NOT CONTROLLED WITH YOUR NAUSEA MEDICATION  *UNUSUAL SHORTNESS OF BREATH  *UNUSUAL BRUISING OR BLEEDING  TENDERNESS IN MOUTH AND THROAT WITH OR WITHOUT PRESENCE OF ULCERS  *URINARY PROBLEMS  *BOWEL PROBLEMS  UNUSUAL RASH Items with * indicate a potential emergency and should be followed up as soon as possible.  Feel free to call the clinic should you have any questions or concerns. The clinic phone number is [phone].  Please show the [facility] at check-in to the Emergency Department and triage nurse.

## 2018-03-17 ENCOUNTER — Ambulatory Visit: Payer: Medicare Other

## 2018-03-17 ENCOUNTER — Other Ambulatory Visit: Payer: Medicare Other

## 2018-03-17 ENCOUNTER — Ambulatory Visit: Payer: Medicare Other | Admitting: Oncology

## 2018-03-18 ENCOUNTER — Encounter: Payer: Self-pay | Admitting: Medical

## 2018-03-18 ENCOUNTER — Inpatient Hospital Stay (HOSPITAL_BASED_OUTPATIENT_CLINIC_OR_DEPARTMENT_OTHER): Payer: Medicare Other | Admitting: Medical

## 2018-03-18 ENCOUNTER — Inpatient Hospital Stay: Payer: Medicare Other

## 2018-03-18 VITALS — BP 112/73 | HR 99 | Temp 97.7°F | Resp 18 | Ht 65.0 in | Wt 163.6 lb

## 2018-03-18 DIAGNOSIS — Z79899 Other long term (current) drug therapy: Secondary | ICD-10-CM

## 2018-03-18 DIAGNOSIS — C3491 Malignant neoplasm of unspecified part of right bronchus or lung: Secondary | ICD-10-CM

## 2018-03-18 DIAGNOSIS — Z9221 Personal history of antineoplastic chemotherapy: Secondary | ICD-10-CM | POA: Diagnosis not present

## 2018-03-18 DIAGNOSIS — I10 Essential (primary) hypertension: Secondary | ICD-10-CM

## 2018-03-18 DIAGNOSIS — Z803 Family history of malignant neoplasm of breast: Secondary | ICD-10-CM | POA: Diagnosis not present

## 2018-03-18 DIAGNOSIS — R5383 Other fatigue: Secondary | ICD-10-CM

## 2018-03-18 DIAGNOSIS — R630 Anorexia: Secondary | ICD-10-CM

## 2018-03-18 DIAGNOSIS — E039 Hypothyroidism, unspecified: Secondary | ICD-10-CM

## 2018-03-18 DIAGNOSIS — Z5112 Encounter for antineoplastic immunotherapy: Secondary | ICD-10-CM | POA: Diagnosis not present

## 2018-03-18 DIAGNOSIS — R413 Other amnesia: Secondary | ICD-10-CM | POA: Diagnosis not present

## 2018-03-18 DIAGNOSIS — Z85820 Personal history of malignant melanoma of skin: Secondary | ICD-10-CM | POA: Diagnosis not present

## 2018-03-18 DIAGNOSIS — C342 Malignant neoplasm of middle lobe, bronchus or lung: Secondary | ICD-10-CM | POA: Diagnosis not present

## 2018-03-18 DIAGNOSIS — E785 Hyperlipidemia, unspecified: Secondary | ICD-10-CM

## 2018-03-18 DIAGNOSIS — Z87891 Personal history of nicotine dependence: Secondary | ICD-10-CM | POA: Diagnosis not present

## 2018-03-18 LAB — CMP (CANCER CENTER ONLY)
ALT: 11 U/L (ref 0–44)
AST: 18 U/L (ref 15–41)
Albumin: 3.1 g/dL — ABNORMAL LOW (ref 3.5–5.0)
Alkaline Phosphatase: 73 U/L (ref 38–126)
Anion gap: 10 (ref 5–15)
BUN: 19 mg/dL (ref 8–23)
CO2: 25 mmol/L (ref 22–32)
Calcium: 9.9 mg/dL (ref 8.9–10.3)
Chloride: 106 mmol/L (ref 98–111)
Creatinine: 1.19 mg/dL — ABNORMAL HIGH (ref 0.44–1.00)
GFR, Est AFR Am: 49 mL/min — ABNORMAL LOW (ref >60)
GFR, Estimated: 42 mL/min — ABNORMAL LOW (ref >60)
Glucose, Bld: 95 mg/dL (ref 70–99)
Potassium: 3.4 mmol/L — ABNORMAL LOW (ref 3.5–5.1)
Sodium: 141 mmol/L (ref 135–145)
Total Bilirubin: 0.6 mg/dL (ref 0.3–1.2)
Total Protein: 6.8 g/dL (ref 6.5–8.1)

## 2018-03-18 LAB — CBC WITH DIFFERENTIAL (CANCER CENTER ONLY)
Abs Immature Granulocytes: 0.02 10*3/uL (ref 0.00–0.07)
Basophils Absolute: 0 10*3/uL (ref 0.0–0.1)
Basophils Relative: 1 %
Eosinophils Absolute: 0.2 10*3/uL (ref 0.0–0.5)
Eosinophils Relative: 4 %
HCT: 34.6 % — ABNORMAL LOW (ref 36.0–46.0)
Hemoglobin: 10.7 g/dL — ABNORMAL LOW (ref 12.0–15.0)
Immature Granulocytes: 0 %
Lymphocytes Relative: 18 %
Lymphs Abs: 0.9 10*3/uL (ref 0.7–4.0)
MCH: 29.1 pg (ref 26.0–34.0)
MCHC: 30.9 g/dL (ref 30.0–36.0)
MCV: 94 fL (ref 80.0–100.0)
Monocytes Absolute: 0.3 10*3/uL (ref 0.1–1.0)
Monocytes Relative: 7 %
Neutro Abs: 3.4 10*3/uL (ref 1.7–7.7)
Neutrophils Relative %: 70 %
Platelet Count: 143 10*3/uL — ABNORMAL LOW (ref 150–400)
RBC: 3.68 MIL/uL — ABNORMAL LOW (ref 3.87–5.11)
RDW: 16.1 % — ABNORMAL HIGH (ref 11.5–15.5)
WBC Count: 5 10*3/uL (ref 4.0–10.5)
nRBC: 0 % (ref 0.0–0.2)

## 2018-03-18 MED ORDER — MIRTAZAPINE 7.5 MG PO TABS: 7.5000 mg | ORAL_TABLET | Freq: Every day | ORAL | 5 refills | Status: DC

## 2018-03-18 MED ORDER — SODIUM CHLORIDE 0.9 % IV SOLN
Freq: Once | INTRAVENOUS | Status: AC
  Administered 2018-03-18: 11:00:00 via INTRAVENOUS
  Filled 2018-03-18: qty 250

## 2018-03-18 MED ORDER — SODIUM CHLORIDE 0.9 % IV SOLN
10.0000 mg/kg | Freq: Once | INTRAVENOUS | Status: AC
  Administered 2018-03-18: 740 mg via INTRAVENOUS
  Filled 2018-03-18: qty 10

## 2018-03-18 NOTE — Patient Instructions (Signed)
[facility] Discharge Instructions for Patients Receiving Chemotherapy  Today you received the following chemotherapy agents Durvalumab.  To help prevent nausea and vomiting after your treatment, we encourage you to take your nausea medication.   If you develop nausea and vomiting that is not controlled by your nausea medication, call the clinic.   BELOW ARE SYMPTOMS THAT SHOULD BE REPORTED IMMEDIATELY:  *FEVER GREATER THAN 100.5 F  *CHILLS WITH OR WITHOUT FEVER  NAUSEA AND VOMITING THAT IS NOT CONTROLLED WITH YOUR NAUSEA MEDICATION  *UNUSUAL SHORTNESS OF BREATH  *UNUSUAL BRUISING OR BLEEDING  TENDERNESS IN MOUTH AND THROAT WITH OR WITHOUT PRESENCE OF ULCERS  *URINARY PROBLEMS  *BOWEL PROBLEMS  UNUSUAL RASH Items with * indicate a potential emergency and should be followed up as soon as possible.  Feel free to call the clinic should you have any questions or concerns. The clinic phone number is [phone].  Please show the [facility] at check-in to the Emergency Department and triage nurse.

## 2018-03-22 NOTE — Progress Notes (Signed)
[facility] Progress Note   Lisa [name] [id number] 08/04/1932 84 y.o.  [name] is managed by Dr. Fanny Bien. Mohamed  Actively treated with chemotherapy/immunotherapy/hormonal therapy: yes  Current Therapy: Imfinzi  Last Treated: 03/04/2018 (cycle 1, day 1)  Assessment: Plan:    Anorexia - Plan: mirtazapine (REMERON) 7.5 MG tablet  Stage III squamous cell carcinoma of right lung ([facility])   Anorexia: Patient was given a prescription for Remeron 7.5 mg p.o. nightly.  Stage III squamous cell carcinoma of the right lung: The patient is scheduled to receive cycle 2, day 1 of Imfinzi today.  She is scheduled to return to see Dr. Julien Nordmann on 03/31/2018.  Please see After Visit Summary for patient specific instructions.  Future Appointments  Date Time Provider [place]  03/31/2018 10:15 AM [facility] LAB 4 [facility] None  03/31/2018 10:45 AM Curt Bears, MD [facility] None  03/31/2018 11:00 AM [facility] INFUSION [facility] None    No orders of the defined types were placed in this encounter.      Subjective:   Patient ID:  Lisa [name] is a 84 y.o. (DOB Apr 20, 1932) female.  Chief Complaint:  Chief Complaint  Patient presents with  . Fatigue  . Anorexia  . Memory Loss    HPI Lisa [name] is an 84 year old female with a history of a stage IIIb (T3, N3, M0) non-small cell lung cancer, squamous cell carcinoma.  She originally presented with large right middle lobe lung mass in addition to right hilar, subcarinal and right supraclavicular lymphadenopathy when she was diagnosed in August 2019.  She was treated with concurrent chemoradiation with weekly carboplatin and paclitaxel.  She completed 6 cycles of therapy.  She is currently receiving consolidation therapy with Imfinzi which is dosed every 2 weeks with her first dose given on 02/28/2018.  She presents to the office today for cycle 2 of Imfinzi.  She does not have a port.  It is  difficult to get an IV placed on her.  She is been having anorexia, some memory loss, and fatigue.  Medications: I have reviewed the patient's current medications.  Allergies:  Allergies  Allergen Reactions  . Atorvastatin Other (See Comments)    CAUSE BURN AND TINGLING IN LEGS  . Celebrex [Celecoxib] Swelling and Rash    SWELLING REACTION UNSPECIFIED   . Cholecalciferol Anxiety and Other (See Comments)    nervous, crying with high dose replacement.    . Simvastatin Other (See Comments)    Myalgias    Past Medical History:  Diagnosis Date  . Diverticulosis   . Headache   . Hemorrhoids    internal and external  . Hyperlipidemia 01/29/1991  . Hypertension 10/29/1995  . Hypothyroidism 10/29/1995  . Lung mass    right middle lobe  . Melanoma ([place])    h/o, local excision. no chemo or rady.   . Skin cancer (melanoma) ([place])   . Tubular adenoma of colon   . Wears dentures     Past Surgical History:  Procedure Laterality Date  . BRONCHIAL BIOPSY  11/23/2017   Procedure: BRONCHIAL BIOPSIES;  Surgeon: Grace Isaac, MD;  Location: [facility] OR;  Service: Thoracic;;  . cataract surgery  2007, 2008   repair bilat  . DG BARIUM ENEMA ([place] HX) N/A 2016   [name]  . HERNIA REPAIR  04/25/2001  . IR US GUIDE BX ASP/DRAIN  11/17/2017  . KNEE SURGERY     meniscus tear per Dr. Ronnie Derby, R  knee  . MULTIPLE TOOTH EXTRACTIONS    . SEPTOPLASTY  2006   Dr. Ernesto Rutherford  . SKIN CANCER EXCISION    . TONSILLECTOMY  1954  . VAGINAL HYSTERECTOMY    . VIDEO BRONCHOSCOPY WITH ENDOBRONCHIAL ULTRASOUND N/A 11/23/2017   Procedure: VIDEO BRONCHOSCOPY WITH ENDOBRONCHIAL ULTRASOUND;  Surgeon: Grace Isaac, MD;  Location: [facility] OR;  Service: Thoracic;  Laterality: N/A;    Family History  Problem Relation Age of Onset  . Stroke Mother   . Heart disease Mother        CHF  . Stomach cancer Sister   . Cancer Brother        Metastatic cancer  . Colon cancer Neg Hx   . Breast cancer Neg Hx      Social History   Socioeconomic History  . Marital status: Married    Spouse name: Not on file  . Number of children: 2  . Years of education: Not on file  . Highest education level: Not on file  Occupational History  . 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 Financial resource strain: Not on file  . Food insecurity:    Worry: Not on file    Inability: Not on file  . Transportation needs:    Medical: Not on file    Non-medical: Not on file  Tobacco Use  . Smoking status: Former Smoker    Packs/day: 1.00    Years: 30.00    Pack years: 30.00    Types: Cigarettes    Start date: 10/28/1958    Last attempt to quit: 10/27/1988    Years since quitting: 29.4  . Smokeless tobacco: Never Used  . Tobacco comment: quit 1994  Substance and Sexual Activity  . Alcohol use: No  . Drug use: No  . Sexual activity: Never  Lifestyle  . Physical activity:    Days per week: Not on file    Minutes per session: Not on file  . Stress: Not on file  Relationships  . Social connections:    Talks on phone: Not on file    Gets together: Not on file    Attends religious service: Not on file    Active member of club or organization: Not on file    Attends meetings of clubs or organizations: Not on file    Relationship status: Not on file  . Intimate partner violence:    Fear of current or ex partner: Not on file    Emotionally abused: Not on file    Physically abused: Not on file    Forced sexual activity: Not on file  Other Topics Concern  . Not on file  Social History Narrative   Married 1957 lives with husband    Past Medical History, Surgical history, Social history, and Family history were reviewed and updated as appropriate.   Please see review of systems for further details on the patient's review from today.   Review of Systems:  Review of Systems  Constitutional: Positive for activity change and fatigue. Negative for  chills, diaphoresis and fever.  HENT: Negative for trouble swallowing and voice change.   Respiratory: Negative for cough, chest tightness, shortness of breath and wheezing.   Cardiovascular: Negative for chest pain and palpitations.  Gastrointestinal: Negative for abdominal pain, constipation, diarrhea, nausea and vomiting.  Musculoskeletal: Negative for back pain and myalgias.  Neurological: Negative for dizziness, light-headedness and headaches.  Psychiatric/Behavioral:  Mild memory loss    Objective:   Physical Exam:  BP 112/73 (BP Location: Left Arm, Patient Position: Sitting)   Pulse 99   Temp 97.7 F (36.5 C) (Oral)   Resp 18   Ht 5\' 5"  (1.651 m)   Wt 163 lb 9.6 oz (74.2 kg)   SpO2 99%   BMI 27.22 kg/m  ECOG: 1  Physical Exam Constitutional:      General: She is not in acute distress.    Appearance: She is not diaphoretic.  HENT:     Head: Normocephalic and atraumatic.  Cardiovascular:     Rate and Rhythm: Normal rate and regular rhythm.     Heart sounds: Normal heart sounds. No murmur. No friction rub. No gallop.   Pulmonary:     Effort: Pulmonary effort is normal. No respiratory distress.     Breath sounds: Normal breath sounds. No wheezing or rales.  Skin:    General: Skin is warm and dry.     Findings: No erythema or rash.  Neurological:     Mental Status: She is alert.     Lab Review:     Component Value Date/Time   NA 141 03/18/2018 0944   K 3.4 (L) 03/18/2018 0944   CL 106 03/18/2018 0944   CO2 25 03/18/2018 0944   GLUCOSE 95 03/18/2018 0944   BUN 19 03/18/2018 0944   CREATININE 1.19 (H) 03/18/2018 0944   CALCIUM 9.9 03/18/2018 0944   PROT 6.8 03/18/2018 0944   ALBUMIN 3.1 (L) 03/18/2018 0944   AST 18 03/18/2018 0944   ALT 11 03/18/2018 0944   ALKPHOS 73 03/18/2018 0944   BILITOT 0.6 03/18/2018 0944   GFRNONAA 42 (L) 03/18/2018 0944   GFRAA 49 (L) 03/18/2018 0944       Component Value Date/Time   WBC 5.0 03/18/2018 0944   WBC  6.7 11/17/2017 1149   RBC 3.68 (L) 03/18/2018 0944   HGB 10.7 (L) 03/18/2018 0944   HCT 34.6 (L) 03/18/2018 0944   PLT 143 (L) 03/18/2018 0944   MCV 94.0 03/18/2018 0944   MCH 29.1 03/18/2018 0944   MCHC 30.9 03/18/2018 0944   RDW 16.1 (H) 03/18/2018 0944   LYMPHSABS 0.9 03/18/2018 0944   MONOABS 0.3 03/18/2018 0944   EOSABS 0.2 03/18/2018 0944   BASOSABS 0.0 03/18/2018 0944   -------------------------------  Imaging from last 24 hours (if applicable):  Radiology interpretation: No results found.

## 2018-03-25 ENCOUNTER — Telehealth: Payer: Self-pay

## 2018-03-25 NOTE — Telephone Encounter (Signed)
Copied from [place] [id number]. Topic: Quick Communication - Office Called Patient (Clinic Use ONLY) >> Mar 25, 2018  3:23 PM Yvette Rack wrote: Reason for CRM: pt calling office back

## 2018-03-28 ENCOUNTER — Other Ambulatory Visit: Payer: Self-pay | Admitting: Internal Medicine

## 2018-03-28 DIAGNOSIS — E876 Hypokalemia: Secondary | ICD-10-CM

## 2018-03-31 ENCOUNTER — Encounter: Payer: Self-pay | Admitting: Internal Medicine

## 2018-03-31 ENCOUNTER — Inpatient Hospital Stay: Payer: Medicare Other

## 2018-03-31 ENCOUNTER — Inpatient Hospital Stay (HOSPITAL_BASED_OUTPATIENT_CLINIC_OR_DEPARTMENT_OTHER): Payer: Medicare Other | Admitting: Internal Medicine

## 2018-03-31 ENCOUNTER — Telehealth: Payer: Self-pay

## 2018-03-31 ENCOUNTER — Inpatient Hospital Stay: Payer: Medicare Other | Attending: Internal Medicine

## 2018-03-31 VITALS — BP 152/92 | HR 93 | Temp 98.3°F | Resp 18 | Ht 65.0 in | Wt 160.8 lb

## 2018-03-31 DIAGNOSIS — E785 Hyperlipidemia, unspecified: Secondary | ICD-10-CM

## 2018-03-31 DIAGNOSIS — Z5112 Encounter for antineoplastic immunotherapy: Secondary | ICD-10-CM | POA: Diagnosis not present

## 2018-03-31 DIAGNOSIS — R590 Localized enlarged lymph nodes: Secondary | ICD-10-CM | POA: Diagnosis not present

## 2018-03-31 DIAGNOSIS — E039 Hypothyroidism, unspecified: Secondary | ICD-10-CM | POA: Insufficient documentation

## 2018-03-31 DIAGNOSIS — Z800 Family history of malignant neoplasm of digestive organs: Secondary | ICD-10-CM | POA: Insufficient documentation

## 2018-03-31 DIAGNOSIS — I10 Essential (primary) hypertension: Secondary | ICD-10-CM

## 2018-03-31 DIAGNOSIS — Z923 Personal history of irradiation: Secondary | ICD-10-CM | POA: Diagnosis not present

## 2018-03-31 DIAGNOSIS — Z85820 Personal history of malignant melanoma of skin: Secondary | ICD-10-CM | POA: Diagnosis not present

## 2018-03-31 DIAGNOSIS — E876 Hypokalemia: Secondary | ICD-10-CM | POA: Diagnosis not present

## 2018-03-31 DIAGNOSIS — Z79899 Other long term (current) drug therapy: Secondary | ICD-10-CM | POA: Insufficient documentation

## 2018-03-31 DIAGNOSIS — R634 Abnormal weight loss: Secondary | ICD-10-CM | POA: Diagnosis not present

## 2018-03-31 DIAGNOSIS — Z7901 Long term (current) use of anticoagulants: Secondary | ICD-10-CM | POA: Insufficient documentation

## 2018-03-31 DIAGNOSIS — R5383 Other fatigue: Secondary | ICD-10-CM | POA: Diagnosis not present

## 2018-03-31 DIAGNOSIS — C3491 Malignant neoplasm of unspecified part of right bronchus or lung: Secondary | ICD-10-CM

## 2018-03-31 DIAGNOSIS — C342 Malignant neoplasm of middle lobe, bronchus or lung: Secondary | ICD-10-CM

## 2018-03-31 DIAGNOSIS — F329 Major depressive disorder, single episode, unspecified: Secondary | ICD-10-CM | POA: Insufficient documentation

## 2018-03-31 DIAGNOSIS — R630 Anorexia: Secondary | ICD-10-CM | POA: Insufficient documentation

## 2018-03-31 DIAGNOSIS — Z9221 Personal history of antineoplastic chemotherapy: Secondary | ICD-10-CM | POA: Diagnosis not present

## 2018-03-31 DIAGNOSIS — Z87891 Personal history of nicotine dependence: Secondary | ICD-10-CM | POA: Diagnosis not present

## 2018-03-31 DIAGNOSIS — I82503 Chronic embolism and thrombosis of unspecified deep veins of lower extremity, bilateral: Secondary | ICD-10-CM | POA: Diagnosis not present

## 2018-03-31 DIAGNOSIS — R600 Localized edema: Secondary | ICD-10-CM | POA: Insufficient documentation

## 2018-03-31 DIAGNOSIS — D039 Melanoma in situ, unspecified: Secondary | ICD-10-CM

## 2018-03-31 LAB — CMP (CANCER CENTER ONLY)
ALT: 10 U/L (ref 0–44)
AST: 19 U/L (ref 15–41)
Albumin: 3.1 g/dL — ABNORMAL LOW (ref 3.5–5.0)
Alkaline Phosphatase: 86 U/L (ref 38–126)
Anion gap: 10 (ref 5–15)
BUN: 20 mg/dL (ref 8–23)
CO2: 26 mmol/L (ref 22–32)
Calcium: 10.8 mg/dL — ABNORMAL HIGH (ref 8.9–10.3)
Chloride: 105 mmol/L (ref 98–111)
Creatinine: 1.38 mg/dL — ABNORMAL HIGH (ref 0.44–1.00)
GFR, Est AFR Am: 41 mL/min — ABNORMAL LOW (ref >60)
GFR, Estimated: 35 mL/min — ABNORMAL LOW (ref >60)
Glucose, Bld: 80 mg/dL (ref 70–99)
Potassium: 3.3 mmol/L — ABNORMAL LOW (ref 3.5–5.1)
Sodium: 141 mmol/L (ref 135–145)
Total Bilirubin: 0.6 mg/dL (ref 0.3–1.2)
Total Protein: 7.1 g/dL (ref 6.5–8.1)

## 2018-03-31 LAB — CBC WITH DIFFERENTIAL (CANCER CENTER ONLY)
Abs Immature Granulocytes: 0.03 10*3/uL (ref 0.00–0.07)
Basophils Absolute: 0 10*3/uL (ref 0.0–0.1)
Basophils Relative: 1 %
Eosinophils Absolute: 0.2 10*3/uL (ref 0.0–0.5)
Eosinophils Relative: 4 %
HCT: 35.5 % — ABNORMAL LOW (ref 36.0–46.0)
Hemoglobin: 11 g/dL — ABNORMAL LOW (ref 12.0–15.0)
Immature Granulocytes: 1 %
Lymphocytes Relative: 19 %
Lymphs Abs: 1.1 10*3/uL (ref 0.7–4.0)
MCH: 28.9 pg (ref 26.0–34.0)
MCHC: 31 g/dL (ref 30.0–36.0)
MCV: 93.4 fL (ref 80.0–100.0)
Monocytes Absolute: 0.5 10*3/uL (ref 0.1–1.0)
Monocytes Relative: 9 %
Neutro Abs: 3.7 10*3/uL (ref 1.7–7.7)
Neutrophils Relative %: 66 %
Platelet Count: 150 10*3/uL (ref 150–400)
RBC: 3.8 MIL/uL — ABNORMAL LOW (ref 3.87–5.11)
RDW: 14.8 % (ref 11.5–15.5)
WBC Count: 5.4 10*3/uL (ref 4.0–10.5)
nRBC: 0 % (ref 0.0–0.2)

## 2018-03-31 LAB — TSH: TSH: 1.867 u[IU]/mL (ref 0.308–3.960)

## 2018-03-31 MED ORDER — SODIUM CHLORIDE 0.9 % IV SOLN
Freq: Once | INTRAVENOUS | Status: AC
  Administered 2018-03-31: 12:00:00 via INTRAVENOUS
  Filled 2018-03-31: qty 250

## 2018-03-31 MED ORDER — SODIUM CHLORIDE 0.9 % IV SOLN
10.0000 mg/kg | Freq: Once | INTRAVENOUS | Status: AC
  Administered 2018-03-31: 740 mg via INTRAVENOUS
  Filled 2018-03-31: qty 10

## 2018-03-31 NOTE — Patient Instructions (Signed)
[facility] Discharge Instructions for Patients Receiving Chemotherapy  Today you received the following chemotherapy agents: Durvalumab (Imfinzi)   To help prevent nausea and vomiting after your treatment, we encourage you to take your nausea medication as directed.   If you develop nausea and vomiting that is not controlled by your nausea medication, call the clinic.   BELOW ARE SYMPTOMS THAT SHOULD BE REPORTED IMMEDIATELY:  *FEVER GREATER THAN 100.5 F  *CHILLS WITH OR WITHOUT FEVER  NAUSEA AND VOMITING THAT IS NOT CONTROLLED WITH YOUR NAUSEA MEDICATION  *UNUSUAL SHORTNESS OF BREATH  *UNUSUAL BRUISING OR BLEEDING  TENDERNESS IN MOUTH AND THROAT WITH OR WITHOUT PRESENCE OF ULCERS  *URINARY PROBLEMS  *BOWEL PROBLEMS  UNUSUAL RASH Items with * indicate a potential emergency and should be followed up as soon as possible.  Feel free to call the clinic should you have any questions or concerns. The clinic phone number is [phone].  Please show the CHEMO ALERT CARD at check-in to the Emergency Department and triage nurse.   

## 2018-03-31 NOTE — Progress Notes (Signed)
[place] [facility] [phone]   [phone]  OFFICE PROGRESS NOTE  [name], MD [place] [address]  DIAGNOSIS: Stage IIIB (T3, N3, M0) non-small cell lung cancer, squamous cell carcinoma presented with large right middle lobe lung mass in addition to right hilar, subcarinal and right supraclavicular lymphadenopathy diagnosed in August 2019.  PRIOR THERAPY: Concurrent chemoradiation with weekly carboplatin for AUC of 2 and paclitaxel 45 NG/M2.  Status post 6 cycles with partial response.   CURRENT THERAPY: Consolidation immunotherapy with Imfinzi 10 mg/KG every 2 weeks started February 28, 2018.  Status post 2 cycles.  INTERVAL HISTORY: Lisa [name] 85 y.o. female returns to the clinic today for follow-up visit accompanied by her granddaughter.  The patient is feeling fine today with no concerning complaints.  She tolerated the last 2 cycles of her treatment with immunotherapy fairly well.  She lost few pounds since her last visit.  She denied having any chest pain, shortness of breath, cough or hemoptysis.  She denied having any fever or chills.  She has no nausea, vomiting, diarrhea or constipation.  She is here today for evaluation before starting cycle #3.  MEDICAL HISTORY: Past Medical History:  Diagnosis Date  . Diverticulosis   . Headache   . Hemorrhoids    internal and external  . Hyperlipidemia 01/29/1991  . Hypertension 10/29/1995  . Hypothyroidism 10/29/1995  . Lung mass    right middle lobe  . Melanoma ([place])    h/o, local excision. no chemo or rady.   . Skin cancer (melanoma) ([place])   . Tubular adenoma of colon   . Wears dentures     ALLERGIES:  is allergic to atorvastatin; celebrex [celecoxib]; cholecalciferol; and simvastatin.  MEDICATIONS:  Current Outpatient Medications  Medication Sig Dispense Refill  . amLODipine (NORVASC) 10 MG tablet Take 1 tablet (10 mg total) by mouth daily. 90 tablet 3    . Cholecalciferol (VITAMIN D) 2000 units tablet Take 2,000 Units by mouth 2 (two) times daily.    . Coenzyme Q10 100 MG capsule Take 100 mg by mouth daily.     Marland Kitchen docusate sodium (COLACE) 100 MG capsule Take 100 mg by mouth daily as needed for mild constipation.    . fluticasone (FLONASE) 50 MCG/ACT nasal spray USE 2 SPRAYS IN EACH NOSTRIL DAILY AS NEEDED FOR RHINITIS 48 g 3  . levothyroxine (SYNTHROID, LEVOTHROID) 75 MCG tablet Take 1 tablet (75 mcg total) by mouth daily. 90 tablet 3  . Melatonin 3 MG TABS Take 1 tablet by mouth at bedtime as needed.    . mirtazapine (REMERON) 7.5 MG tablet Take 1 tablet (7.5 mg total) by mouth at bedtime. 30 tablet 5  . potassium chloride SA (K-DUR,KLOR-CON) 20 MEQ tablet TAKE 1 TABLET BY MOUTH TWICE DAILY 7 tablet 0  . prochlorperazine (COMPAZINE) 10 MG tablet Take 1 tablet (10 mg total) by mouth every 6 (six) hours as needed for nausea or vomiting. (Patient not taking: Reported on 01/03/2018) 30 tablet 0  . sennosides-docusate sodium (SENOKOT-S) 8.6-50 MG tablet Take 1 tablet by mouth daily as needed for constipation.    . sucralfate (CARAFATE) 1 g tablet Take 1 tablet (1 g total) by mouth 4 (four) times daily -  with meals and at bedtime. Crush and dissolve in 1 tablespoon of water prior to swallowing 30 tablet 2   No current facility-administered medications for this visit.    Facility-Administered Medications Ordered  in Other Visits  Medication Dose Route Frequency Provider Last Rate Last Dose  . acetaminophen (TYLENOL) tablet 650 mg  650 mg Oral Once Nicholas Lose, MD        SURGICAL HISTORY:  Past Surgical History:  Procedure Laterality Date  . BRONCHIAL BIOPSY  11/23/2017   Procedure: BRONCHIAL BIOPSIES;  Surgeon: Grace Isaac, MD;  Location: [facility] OR;  Service: Thoracic;;  . cataract surgery  2007, 2008   repair bilat  . DG BARIUM ENEMA ([place] HX) N/A 2016   [name]  . HERNIA REPAIR  04/25/2001  . IR US GUIDE BX ASP/DRAIN  11/17/2017  .  KNEE SURGERY     meniscus tear per Dr. Ronnie Derby, R knee  . MULTIPLE TOOTH EXTRACTIONS    . SEPTOPLASTY  2006   Dr. Ernesto Rutherford  . SKIN CANCER EXCISION    . TONSILLECTOMY  1954  . VAGINAL HYSTERECTOMY    . VIDEO BRONCHOSCOPY WITH ENDOBRONCHIAL ULTRASOUND N/A 11/23/2017   Procedure: VIDEO BRONCHOSCOPY WITH ENDOBRONCHIAL ULTRASOUND;  Surgeon: Grace Isaac, MD;  Location: [facility];  Service: Thoracic;  Laterality: N/A;    REVIEW OF SYSTEMS:  A comprehensive review of systems was negative except for: Constitutional: positive for fatigue and weight loss   PHYSICAL EXAMINATION: General appearance: alert, cooperative, fatigued and no distress Head: Normocephalic, without obvious abnormality, atraumatic Neck: no adenopathy, no JVD, supple, symmetrical, trachea midline and thyroid not enlarged, symmetric, no tenderness/mass/nodules Lymph nodes: Cervical, supraclavicular, and axillary nodes normal. Resp: clear to auscultation bilaterally Back: symmetric, no curvature. ROM normal. No CVA tenderness. Cardio: regular rate and rhythm, S1, S2 normal, no murmur, click, rub or gallop GI: soft, non-tender; bowel sounds normal; no masses,  no organomegaly Extremities: extremities normal, atraumatic, no cyanosis or edema  ECOG PERFORMANCE STATUS: 1 - Symptomatic but completely ambulatory  Blood pressure (!) 152/92, pulse 93, temperature 98.3 F (36.8 C), temperature source Oral, resp. rate 18, height 5\' 5"  (1.651 m), weight 160 lb 12.8 oz (72.9 kg), SpO2 100 %.  LABORATORY DATA: Lab Results  Component Value Date   WBC 5.4 03/31/2018   HGB 11.0 (L) 03/31/2018   HCT 35.5 (L) 03/31/2018   MCV 93.4 03/31/2018   PLT 150 03/31/2018      Chemistry      Component Value Date/Time   NA 141 03/31/2018 1010   K 3.3 (L) 03/31/2018 1010   CL 105 03/31/2018 1010   CO2 26 03/31/2018 1010   BUN 20 03/31/2018 1010   CREATININE 1.38 (H) 03/31/2018 1010      Component Value Date/Time   CALCIUM 10.8 (H)  03/31/2018 1010   ALKPHOS 86 03/31/2018 1010   AST 19 03/31/2018 1010   ALT 10 03/31/2018 1010   BILITOT 0.6 03/31/2018 1010       RADIOGRAPHIC STUDIES: No results found.  ASSESSMENT AND PLAN: This is a very pleasant 85 years old white female with stage IIIb non-small cell lung cancer, squamous cell carcinoma.  She is currently undergoing a course of concurrent chemoradiation with weekly carboplatin and paclitaxel status post 6 cycles.  She tolerated this treatment well with partial response. She is currently undergoing consolidation treatment with immunotherapy with Imfinzi status post 2 cycles.  She tolerated the first 2 cycles of her treatment well. I recommended for her to proceed with cycle #3 today as scheduled. I will see her back for follow-up visit in 2 weeks for evaluation before starting cycle #4. For the depression and lack of appetite, she will  continue her current treatment with Remeron. The patient was advised to call immediately if she has any concerning symptoms in the interval. The patient voices understanding of current disease status and treatment options and is in agreement with the current care plan. All questions were answered. The patient knows to call the clinic with any problems, questions or concerns. We can certainly see the patient much sooner if necessary.  I spent 10 minutes counseling the patient face to face. The total time spent in the appointment was 15 minutes.  Disclaimer: This note was dictated with voice recognition software. Similar sounding words can inadvertently be transcribed and may not be corrected upon review.

## 2018-03-31 NOTE — Telephone Encounter (Signed)
Printed avs and calender of upcoming appointment. Per 1/2 los 

## 2018-04-13 ENCOUNTER — Inpatient Hospital Stay (HOSPITAL_BASED_OUTPATIENT_CLINIC_OR_DEPARTMENT_OTHER): Payer: Medicare Other | Admitting: Adult Health

## 2018-04-13 ENCOUNTER — Ambulatory Visit (HOSPITAL_COMMUNITY): Payer: Medicare Other | Attending: Adult Health

## 2018-04-13 ENCOUNTER — Inpatient Hospital Stay: Payer: Medicare Other

## 2018-04-13 ENCOUNTER — Telehealth: Payer: Self-pay | Admitting: Adult Health

## 2018-04-13 ENCOUNTER — Encounter: Payer: Self-pay | Admitting: Adult Health

## 2018-04-13 VITALS — BP 133/85 | HR 97 | Temp 97.9°F | Resp 18 | Ht 65.0 in | Wt 160.0 lb

## 2018-04-13 VITALS — BP 143/86 | HR 95 | Temp 98.0°F | Resp 18

## 2018-04-13 DIAGNOSIS — Z5112 Encounter for antineoplastic immunotherapy: Secondary | ICD-10-CM

## 2018-04-13 DIAGNOSIS — I82503 Chronic embolism and thrombosis of unspecified deep veins of lower extremity, bilateral: Secondary | ICD-10-CM | POA: Diagnosis not present

## 2018-04-13 DIAGNOSIS — E785 Hyperlipidemia, unspecified: Secondary | ICD-10-CM | POA: Diagnosis not present

## 2018-04-13 DIAGNOSIS — Z79899 Other long term (current) drug therapy: Secondary | ICD-10-CM

## 2018-04-13 DIAGNOSIS — R600 Localized edema: Secondary | ICD-10-CM

## 2018-04-13 DIAGNOSIS — M7989 Other specified soft tissue disorders: Secondary | ICD-10-CM

## 2018-04-13 DIAGNOSIS — E039 Hypothyroidism, unspecified: Secondary | ICD-10-CM | POA: Diagnosis not present

## 2018-04-13 DIAGNOSIS — R5383 Other fatigue: Secondary | ICD-10-CM

## 2018-04-13 DIAGNOSIS — Z87891 Personal history of nicotine dependence: Secondary | ICD-10-CM

## 2018-04-13 DIAGNOSIS — Z923 Personal history of irradiation: Secondary | ICD-10-CM

## 2018-04-13 DIAGNOSIS — C3491 Malignant neoplasm of unspecified part of right bronchus or lung: Secondary | ICD-10-CM

## 2018-04-13 DIAGNOSIS — R630 Anorexia: Secondary | ICD-10-CM | POA: Diagnosis not present

## 2018-04-13 DIAGNOSIS — Z800 Family history of malignant neoplasm of digestive organs: Secondary | ICD-10-CM

## 2018-04-13 DIAGNOSIS — F329 Major depressive disorder, single episode, unspecified: Secondary | ICD-10-CM | POA: Diagnosis not present

## 2018-04-13 DIAGNOSIS — E876 Hypokalemia: Secondary | ICD-10-CM | POA: Diagnosis not present

## 2018-04-13 DIAGNOSIS — R634 Abnormal weight loss: Secondary | ICD-10-CM | POA: Diagnosis not present

## 2018-04-13 DIAGNOSIS — C342 Malignant neoplasm of middle lobe, bronchus or lung: Secondary | ICD-10-CM | POA: Diagnosis not present

## 2018-04-13 DIAGNOSIS — Z85820 Personal history of malignant melanoma of skin: Secondary | ICD-10-CM

## 2018-04-13 DIAGNOSIS — I10 Essential (primary) hypertension: Secondary | ICD-10-CM | POA: Diagnosis not present

## 2018-04-13 DIAGNOSIS — Z9221 Personal history of antineoplastic chemotherapy: Secondary | ICD-10-CM | POA: Diagnosis not present

## 2018-04-13 DIAGNOSIS — Z7901 Long term (current) use of anticoagulants: Secondary | ICD-10-CM | POA: Diagnosis not present

## 2018-04-13 DIAGNOSIS — R590 Localized enlarged lymph nodes: Secondary | ICD-10-CM | POA: Diagnosis not present

## 2018-04-13 LAB — CMP (CANCER CENTER ONLY)
ALT: 10 U/L (ref 0–44)
AST: 18 U/L (ref 15–41)
Albumin: 3.1 g/dL — ABNORMAL LOW (ref 3.5–5.0)
Alkaline Phosphatase: 86 U/L (ref 38–126)
Anion gap: 10 (ref 5–15)
BUN: 22 mg/dL (ref 8–23)
CO2: 26 mmol/L (ref 22–32)
Calcium: 11.2 mg/dL — ABNORMAL HIGH (ref 8.9–10.3)
Chloride: 107 mmol/L (ref 98–111)
Creatinine: 1.49 mg/dL — ABNORMAL HIGH (ref 0.44–1.00)
GFR, Est AFR Am: 37 mL/min — ABNORMAL LOW (ref >60)
GFR, Estimated: 32 mL/min — ABNORMAL LOW (ref >60)
Glucose, Bld: 74 mg/dL (ref 70–99)
Potassium: 3.7 mmol/L (ref 3.5–5.1)
Sodium: 143 mmol/L (ref 135–145)
Total Bilirubin: 0.5 mg/dL (ref 0.3–1.2)
Total Protein: 6.9 g/dL (ref 6.5–8.1)

## 2018-04-13 LAB — CBC WITH DIFFERENTIAL (CANCER CENTER ONLY)
Abs Immature Granulocytes: 0.02 10*3/uL (ref 0.00–0.07)
Basophils Absolute: 0 10*3/uL (ref 0.0–0.1)
Basophils Relative: 1 %
Eosinophils Absolute: 0.2 10*3/uL (ref 0.0–0.5)
Eosinophils Relative: 4 %
HCT: 37.1 % (ref 36.0–46.0)
Hemoglobin: 11.4 g/dL — ABNORMAL LOW (ref 12.0–15.0)
Immature Granulocytes: 0 %
Lymphocytes Relative: 22 %
Lymphs Abs: 1 10*3/uL (ref 0.7–4.0)
MCH: 29.2 pg (ref 26.0–34.0)
MCHC: 30.7 g/dL (ref 30.0–36.0)
MCV: 95.1 fL (ref 80.0–100.0)
Monocytes Absolute: 0.5 10*3/uL (ref 0.1–1.0)
Monocytes Relative: 10 %
Neutro Abs: 2.9 10*3/uL (ref 1.7–7.7)
Neutrophils Relative %: 63 %
Platelet Count: 135 10*3/uL — ABNORMAL LOW (ref 150–400)
RBC: 3.9 MIL/uL (ref 3.87–5.11)
RDW: 14.1 % (ref 11.5–15.5)
WBC Count: 4.7 10*3/uL (ref 4.0–10.5)
nRBC: 0 % (ref 0.0–0.2)

## 2018-04-13 MED ORDER — SODIUM CHLORIDE 0.9 % IV SOLN
10.0000 mg/kg | Freq: Once | INTRAVENOUS | Status: AC
  Administered 2018-04-13: 740 mg via INTRAVENOUS
  Filled 2018-04-13: qty 4.8

## 2018-04-13 MED ORDER — SODIUM CHLORIDE 0.9 % IV SOLN
Freq: Once | INTRAVENOUS | Status: AC
  Administered 2018-04-13: 10:00:00 via INTRAVENOUS
  Filled 2018-04-13: qty 250

## 2018-04-13 NOTE — Telephone Encounter (Signed)
Per 1/15 no los

## 2018-04-13 NOTE — Progress Notes (Signed)
[facility] Follow up:    Lisa Ghent, MD [place] [address]   DIAGNOSIS: Stage IIIB (T3, N3, M0) non-small cell lung cancer, squamous cell carcinoma presented with large right middle lobe lung mass in addition to right hilar, subcarinal and right supraclavicular lymphadenopathy diagnosed in August 2019.  SUMMARY OF ONCOLOGIC HISTORY: Oncology History   Patient presented with increasing cough and CP.  Work up showed right middle lung cavitary mass.      Stage III squamous cell carcinoma of right lung ([place])   10/26/2017 Imaging    CT Chest IMPRESSION: Right middle lobe central thick walled cavitary lung mass is identified. In the absence of signs or symptoms of pneumonia findings are worrisome for a necrotic tumor. Associated postobstructive pneumonitis of the right middle lobe is noted    11/08/2017 Imaging    PET scan IMPRESSION: Large cavitary right middle lobe lung mass is hypermetabolic and consistent with primary lung neoplasm. There is also adjacent right hilar adenopathy and subcarinal adenopathy. 8 mm right supraclavicular lymph node is hypermetabolic and likely represents metastatic disease. This may be amenable to ultrasound-guided biopsy.    11/23/2017 Procedure    Bronchoscopy    11/25/2017 Pathology Results    Lung, biopsy, RML - SQUAMOUS CELL CARCINOMA - SEE COMMENT Microscopic Comment Based on the biopsy the carcinoma appears moderate to poorly differentiated. By immunohistochemistry, the neoplasm is positive for cytokeratin 5/6 and p63 but negative for TTF1, supporting the diagnosis of squamous cell carcinoma.     12/02/2017 Initial Diagnosis    Stage III squamous cell carcinoma of right lung ([place])    12/13/2017 - 01/23/2018 Chemotherapy    The patient had palonosetron (ALOXI) injection 0.25 mg, 0.25 mg, Intravenous,  Once, 6 of 6 cycles Administration: 0.25 mg (12/13/2017), 0.25 mg (01/10/2018), 0.25 mg  (01/17/2018), 0.25 mg (12/20/2017), 0.25 mg (12/27/2017), 0.25 mg (01/03/2018) CARBOplatin (PARAPLATIN) 120 mg in sodium chloride 0.9 % 100 mL chemo infusion, 120 mg (100 % of original dose 117.2 mg), Intravenous,  Once, 6 of 6 cycles Dose modification: 117.2 mg (original dose 117.2 mg, Cycle 1), 152.4 mg (original dose 117.2 mg, Cycle 6, Reason: Provider Judgment) Administration: 120 mg (12/13/2017), 120 mg (01/10/2018), 120 mg (12/20/2017), 120 mg (12/27/2017), 120 mg (01/03/2018) PACLitaxel (TAXOL) 84 mg in sodium chloride 0.9 % 250 mL chemo infusion (</= 80mg /m2), 45 mg/m2 = 84 mg, Intravenous,  Once, 6 of 6 cycles Administration: 84 mg (12/13/2017), 84 mg (01/10/2018), 84 mg (01/17/2018), 84 mg (12/20/2017), 84 mg (12/27/2017), 84 mg (01/03/2018)  for chemotherapy treatment.     03/04/2018 -  Chemotherapy    The patient had durvalumab (IMFINZI) 740 mg in sodium chloride 0.9 % 100 mL chemo infusion, 10 mg/kg = 740 mg, Intravenous,  Once, 4 of 10 cycles Administration: 740 mg (03/04/2018), 740 mg (03/18/2018), 740 mg (03/31/2018)  for chemotherapy treatment.      CURRENT THERAPY:Consolidation immunotherapy with Imfinzi 10 mg/KG every 2 weeks started February 28, 2018.   INTERVAL HISTORY: Lisa [name] 85 y.o. female returns for evaluation prior receiving her immunotherapy with imfinzi.  She is tolerating it well.  She is feeling well.  She is with her son today.  Her appetite and weight are stable.  Her activity level has remained at baseline.  She does note some fatigue.     Patient Active Problem List   Diagnosis Date Noted  . Encounter for antineoplastic immunotherapy 02/16/2018  . Stage III  squamous cell carcinoma of right lung ([place]) 12/02/2017  . Encounter for antineoplastic chemotherapy 12/02/2017  . Goals of care, counseling/discussion 12/02/2017  . Vitamin D deficiency 06/19/2016  . Elevated serum creatinine 06/19/2016  . Advance care planning 06/08/2014  . Medicare annual wellness visit,  subsequent 05/19/2012  . Dysuria 11/26/2010  . Bilateral leg edema 11/26/2010  . OBESITY 12/15/2006  . Hypothyroidism 10/29/1995  . Essential hypertension 10/29/1995  . HLD (hyperlipidemia) 01/29/1991    is allergic to atorvastatin; celebrex [celecoxib]; cholecalciferol; and simvastatin.  MEDICAL HISTORY: Past Medical History:  Diagnosis Date  . Diverticulosis   . Headache   . Hemorrhoids    internal and external  . Hyperlipidemia 01/29/1991  . Hypertension 10/29/1995  . Hypothyroidism 10/29/1995  . Lung mass    right middle lobe  . Melanoma ([place])    h/o, local excision. no chemo or rady.   . Skin cancer (melanoma) ([place])   . Tubular adenoma of colon   . Wears dentures     SURGICAL HISTORY: Past Surgical History:  Procedure Laterality Date  . BRONCHIAL BIOPSY  11/23/2017   Procedure: BRONCHIAL BIOPSIES;  Surgeon: Grace Isaac, MD;  Location: [facility] OR;  Service: Thoracic;;  . cataract surgery  2007, 2008   repair bilat  . DG BARIUM ENEMA ([facility] HX) N/A 2016   [name]  . HERNIA REPAIR  04/25/2001  . IR US GUIDE BX ASP/DRAIN  11/17/2017  . KNEE SURGERY     meniscus tear per Dr. Ronnie Derby, R knee  . MULTIPLE TOOTH EXTRACTIONS    . SEPTOPLASTY  2006   Dr. Ernesto Rutherford  . SKIN CANCER EXCISION    . TONSILLECTOMY  1954  . VAGINAL HYSTERECTOMY    . VIDEO BRONCHOSCOPY WITH ENDOBRONCHIAL ULTRASOUND N/A 11/23/2017   Procedure: VIDEO BRONCHOSCOPY WITH ENDOBRONCHIAL ULTRASOUND;  Surgeon: Grace Isaac, MD;  Location: [facility];  Service: Thoracic;  Laterality: N/A;    SOCIAL HISTORY: Social History   Socioeconomic History  . Marital status: Married    Spouse name: Not on file  . Number of children: 2  . Years of education: Not on file  . Highest education level: Not on file  Occupational History  . 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 Financial resource strain: Not on file  . Food insecurity:    Worry: Not on  file    Inability: Not on file  . Transportation needs:    Medical: Not on file    Non-medical: Not on file  Tobacco Use  . Smoking status: Former Smoker    Packs/day: 1.00    Years: 30.00    Pack years: 30.00    Types: Cigarettes    Start date: 10/28/1958    Last attempt to quit: 10/27/1988    Years since quitting: 29.4  . Smokeless tobacco: Never Used  . Tobacco comment: quit 1994  Substance and Sexual Activity  . Alcohol use: No  . Drug use: No  . Sexual activity: Never  Lifestyle  . Physical activity:    Days per week: Not on file    Minutes per session: Not on file  . Stress: Not on file  Relationships  . Social connections:    Talks on phone: Not on file    Gets together: Not on file    Attends religious service: Not on file    Active member of club or organization: Not on file  Attends meetings of clubs or organizations: Not on file    Relationship status: Not on file  . Intimate partner violence:    Fear of current or ex partner: Not on file    Emotionally abused: Not on file    Physically abused: Not on file    Forced sexual activity: Not on file  Other Topics Concern  . Not on file  Social History Narrative   Married 1957 lives with husband    FAMILY HISTORY: Family History  Problem Relation Age of Onset  . Stroke Mother   . Heart disease Mother        CHF  . Stomach cancer Sister   . Cancer Brother        Metastatic cancer  . Colon cancer Neg Hx   . Breast cancer Neg Hx     Review of Systems  Constitutional: Positive for fatigue. Negative for appetite change, chills, fever and unexpected weight change.  HENT:   Negative for hearing loss, lump/mass and trouble swallowing.   Eyes: Negative for eye problems and icterus.  Respiratory: Positive for shortness of breath (with exertion, at baseline, not worsening). Negative for chest tightness and cough.   Cardiovascular: Negative for chest pain, leg swelling and palpitations.  Gastrointestinal:  Negative for abdominal distention, abdominal pain, constipation, diarrhea, nausea and vomiting.  Endocrine: Negative for hot flashes.  Musculoskeletal: Negative for arthralgias.  Skin: Negative for itching and rash.  Neurological: Negative for dizziness, extremity weakness, headaches and numbness.  Hematological: Negative for adenopathy. Does not bruise/bleed easily.  Psychiatric/Behavioral: Negative for depression. The patient is not nervous/anxious.       PHYSICAL EXAMINATION  ECOG PERFORMANCE STATUS: 1 - Symptomatic but completely ambulatory  Vitals:   04/13/18 0840  BP: 133/85  Pulse: 97  Resp: 18  Temp: 97.9 F (36.6 C)  SpO2: 94%    Physical Exam Constitutional:      Appearance: Normal appearance.  HENT:     Head: Normocephalic and atraumatic.     Mouth/Throat:     Mouth: Mucous membranes are moist.     Pharynx: Oropharynx is clear. No oropharyngeal exudate.  Eyes:     General: No scleral icterus.    Pupils: Pupils are equal, round, and reactive to light.  Neck:     Musculoskeletal: Neck supple.  Cardiovascular:     Rate and Rhythm: Normal rate and regular rhythm.     Pulses: Normal pulses.     Heart sounds: Normal heart sounds.  Pulmonary:     Effort: Pulmonary effort is normal.     Breath sounds: Normal breath sounds.  Abdominal:     General: Abdomen is flat.     Palpations: Abdomen is soft.  Musculoskeletal:        General: Swelling (bilateral lower extremity swelling, right greater than left) present.  Lymphadenopathy:     Cervical: No cervical adenopathy.  Skin:    General: Skin is warm and dry.     Capillary Refill: Capillary refill takes less than 2 seconds.     Findings: No rash.  Neurological:     General: No focal deficit present.     Mental Status: She is alert.  Psychiatric:        Mood and Affect: Mood normal.     LABORATORY DATA:  CBC    Component Value Date/Time   WBC 4.7 04/13/2018 0823   WBC 6.7 11/17/2017 1149   RBC 3.90  04/13/2018 0823   HGB 11.4 (L) 04/13/2018  0823   HCT 37.1 04/13/2018 0823   PLT 135 (L) 04/13/2018 0823   MCV 95.1 04/13/2018 0823   MCH 29.2 04/13/2018 0823   MCHC 30.7 04/13/2018 0823   RDW 14.1 04/13/2018 0823   LYMPHSABS 1.0 04/13/2018 0823   MONOABS 0.5 04/13/2018 0823   EOSABS 0.2 04/13/2018 0823   BASOSABS 0.0 04/13/2018 0823    CMP     Component Value Date/Time   NA 143 04/13/2018 0823   K 3.7 04/13/2018 0823   CL 107 04/13/2018 0823   CO2 26 04/13/2018 0823   GLUCOSE 74 04/13/2018 0823   BUN 22 04/13/2018 0823   CREATININE 1.49 (H) 04/13/2018 0823   CALCIUM 11.2 (H) 04/13/2018 0823   PROT 6.9 04/13/2018 0823   ALBUMIN 3.1 (L) 04/13/2018 0823   AST 18 04/13/2018 0823   ALT 10 04/13/2018 0823   ALKPHOS 86 04/13/2018 0823   BILITOT 0.5 04/13/2018 0823   GFRNONAA 32 (L) 04/13/2018 0823   GFRAA 37 (L) 04/13/2018 0823           ASSESSMENT and THERAPY PLAN:   Stage III squamous cell carcinoma of right lung (HCC) Lisa [name] is an 85 year old woman with stage III squamous cell carcinoma of the right lung here today for evaluation prior to receiving Imfinzi.    1. Stage III squamous cell of the lung: Patient labs reviewed, will proceed with Imfinzi today.  No clinical signs of progression.  To f/u in 2 weeks for labs, evaluation by Freida Busman NP, and next treatment.    2. Bilateral lower extremity swelling, right greater than left: will get doppler to evaluate  3. Fatigue: recommended short walks to help with this.      All questions were answered. The patient knows to call the clinic with any problems, questions or concerns. We can certainly see the patient much sooner if necessary.  A total of (30) minutes of face-to-face time was spent with this patient with greater than 50% of that time in counseling and care-coordination.  This note was electronically signed. Scot Dock, NP 04/13/2018

## 2018-04-13 NOTE — Assessment & Plan Note (Signed)
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    1. Stage III squamous cell of the lung: Patient labs reviewed, will proceed with Imfinzi today.  No clinical signs of progression.  To f/u in 2 weeks for labs, evaluation by Freida Busman NP, and next treatment.    2. Bilateral lower extremity swelling, right greater than left: will get doppler to evaluate  3. Fatigue: recommended short walks to help with this.

## 2018-04-13 NOTE — Patient Instructions (Signed)
[facility] Discharge Instructions for Patients Receiving Chemotherapy  Today you received the following chemotherapy agents: Durvalumab (Imfinzi)  To help prevent nausea and vomiting after your treatment, we encourage you to take your nausea medication as directed.    If you develop nausea and vomiting that is not controlled by your nausea medication, call the clinic.   BELOW ARE SYMPTOMS THAT SHOULD BE REPORTED IMMEDIATELY:  *FEVER GREATER THAN 100.5 F  *CHILLS WITH OR WITHOUT FEVER  NAUSEA AND VOMITING THAT IS NOT CONTROLLED WITH YOUR NAUSEA MEDICATION  *UNUSUAL SHORTNESS OF BREATH  *UNUSUAL BRUISING OR BLEEDING  TENDERNESS IN MOUTH AND THROAT WITH OR WITHOUT PRESENCE OF ULCERS  *URINARY PROBLEMS  *BOWEL PROBLEMS  UNUSUAL RASH Items with * indicate a potential emergency and should be followed up as soon as possible.  Feel free to call the clinic should you have any questions or concerns. The clinic phone number is [phone].  Please show the [facility] at check-in to the Emergency Department and triage nurse.  Durvalumab injection What is this medicine? DURVALUMAB (dur VAL ue mab) is a monoclonal antibody. It is used to treat urothelial cancer and lung cancer. This medicine may be used for other purposes; ask your health care provider or pharmacist if you have questions. COMMON BRAND NAME(S): IMFINZI What should I tell my health care provider before I take this medicine? They need to know if you have any of these conditions: -diabetes -immune system problems -infection -inflammatory bowel disease -kidney disease -liver disease -lung or breathing disease -lupus -organ transplant -stomach or intestine problems -thyroid disease -an unusual or allergic reaction to durvalumab, other medicines, foods, dyes, or preservatives -pregnant or trying to get pregnant -breast-feeding How should I use this medicine? This medicine is for infusion  into a vein. It is given by a health care professional in a hospital or clinic setting. A special MedGuide will be given to you before each treatment. Be sure to read this information carefully each time. Talk to your pediatrician regarding the use of this medicine in children. Special care may be needed. Overdosage: If you think you have taken too much of this medicine contact a poison control center or emergency room at once. NOTE: This medicine is only for you. Do not share this medicine with others. What if I miss a dose? It is important not to miss your dose. Call your doctor or health care professional if you are unable to keep an appointment. What may interact with this medicine? Interactions have not been studied. This list may not describe all possible interactions. Give your health care provider a list of all the medicines, herbs, non-prescription drugs, or dietary supplements you use. Also tell them if you smoke, drink alcohol, or use illegal drugs. Some items may interact with your medicine. What should I watch for while using this medicine? This drug may make you feel generally unwell. Continue your course of treatment even though you feel ill unless your doctor tells you to stop. You may need blood work done while you are taking this medicine. Do not become pregnant while taking this medicine or for 3 months after stopping it. Women should inform their doctor if they wish to become pregnant or think they might be pregnant. There is a potential for serious side effects to an unborn child. Talk to your health care professional or pharmacist for more information. Do not breast-feed an infant while taking this medicine or for 3 months after stopping  it. What side effects may I notice from receiving this medicine? Side effects that you should report to your doctor or health care professional as soon as possible: -allergic reactions like skin rash, itching or hives, swelling of the face,  lips, or tongue -black, tarry stools -bloody or watery diarrhea -breathing problems -change in emotions or moods -change in sex drive -changes in vision -chest pain or chest tightness -chills -confusion -cough -facial flushing -fever -headache -signs and symptoms of high blood sugar such as dizziness; dry mouth; dry skin; fruity breath; nausea; stomach pain; increased hunger or thirst; increased urination -signs and symptoms of liver injury like dark yellow or brown urine; general ill feeling or flu-like symptoms; light-colored stools; loss of appetite; nausea; right upper belly pain; unusually weak or tired; yellowing of the eyes or skin -stomach pain -trouble passing urine or change in the amount of urine -weight gain or weight loss Side effects that usually do not require medical attention (report these to your doctor or health care professional if they continue or are bothersome): -bone pain -constipation -loss of appetite -muscle pain -nausea -swelling of the ankles, feet, hands -tiredness This list may not describe all possible side effects. Call your doctor for medical advice about side effects. You may report side effects to FDA at 1-800-FDA-1088. Where should I keep my medicine? This drug is given in a hospital or clinic and will not be stored at home. NOTE: This sheet is a summary. It may not cover all possible information. If you have questions about this medicine, talk to your doctor, pharmacist, or health care provider.  2019 Elsevier/Gold Standard (2016-05-26 19:25:04)

## 2018-04-19 ENCOUNTER — Ambulatory Visit (HOSPITAL_COMMUNITY)
Admission: RE | Admit: 2018-04-19 | Discharge: 2018-04-19 | Disposition: A | Discharge status: Home / Self Care | Payer: Medicare Other | Source: Ambulatory Visit | Attending: Adult Health | Admitting: Adult Health

## 2018-04-19 ENCOUNTER — Encounter: Payer: Self-pay | Admitting: Adult Health

## 2018-04-19 ENCOUNTER — Inpatient Hospital Stay (HOSPITAL_BASED_OUTPATIENT_CLINIC_OR_DEPARTMENT_OTHER): Payer: Medicare Other | Admitting: Adult Health

## 2018-04-19 VITALS — BP 126/81 | HR 104 | Temp 97.1°F | Resp 18 | Ht 65.0 in | Wt 154.9 lb

## 2018-04-19 DIAGNOSIS — C3491 Malignant neoplasm of unspecified part of right bronchus or lung: Secondary | ICD-10-CM

## 2018-04-19 DIAGNOSIS — Z79899 Other long term (current) drug therapy: Secondary | ICD-10-CM

## 2018-04-19 DIAGNOSIS — E785 Hyperlipidemia, unspecified: Secondary | ICD-10-CM | POA: Diagnosis not present

## 2018-04-19 DIAGNOSIS — Z923 Personal history of irradiation: Secondary | ICD-10-CM

## 2018-04-19 DIAGNOSIS — R590 Localized enlarged lymph nodes: Secondary | ICD-10-CM | POA: Diagnosis not present

## 2018-04-19 DIAGNOSIS — E039 Hypothyroidism, unspecified: Secondary | ICD-10-CM

## 2018-04-19 DIAGNOSIS — Z85820 Personal history of malignant melanoma of skin: Secondary | ICD-10-CM

## 2018-04-19 DIAGNOSIS — Z7901 Long term (current) use of anticoagulants: Secondary | ICD-10-CM

## 2018-04-19 DIAGNOSIS — I82503 Chronic embolism and thrombosis of unspecified deep veins of lower extremity, bilateral: Secondary | ICD-10-CM

## 2018-04-19 DIAGNOSIS — R634 Abnormal weight loss: Secondary | ICD-10-CM | POA: Diagnosis not present

## 2018-04-19 DIAGNOSIS — Z5112 Encounter for antineoplastic immunotherapy: Secondary | ICD-10-CM | POA: Diagnosis not present

## 2018-04-19 DIAGNOSIS — Z9221 Personal history of antineoplastic chemotherapy: Secondary | ICD-10-CM | POA: Diagnosis not present

## 2018-04-19 DIAGNOSIS — R630 Anorexia: Secondary | ICD-10-CM | POA: Diagnosis not present

## 2018-04-19 DIAGNOSIS — Z87891 Personal history of nicotine dependence: Secondary | ICD-10-CM

## 2018-04-19 DIAGNOSIS — R5383 Other fatigue: Secondary | ICD-10-CM | POA: Diagnosis not present

## 2018-04-19 DIAGNOSIS — M7989 Other specified soft tissue disorders: Secondary | ICD-10-CM | POA: Diagnosis not present

## 2018-04-19 DIAGNOSIS — R600 Localized edema: Secondary | ICD-10-CM | POA: Diagnosis not present

## 2018-04-19 DIAGNOSIS — F329 Major depressive disorder, single episode, unspecified: Secondary | ICD-10-CM | POA: Diagnosis not present

## 2018-04-19 DIAGNOSIS — Z800 Family history of malignant neoplasm of digestive organs: Secondary | ICD-10-CM

## 2018-04-19 DIAGNOSIS — I10 Essential (primary) hypertension: Secondary | ICD-10-CM

## 2018-04-19 DIAGNOSIS — C342 Malignant neoplasm of middle lobe, bronchus or lung: Secondary | ICD-10-CM | POA: Diagnosis not present

## 2018-04-19 DIAGNOSIS — E876 Hypokalemia: Secondary | ICD-10-CM | POA: Diagnosis not present

## 2018-04-19 MED ORDER — RIVAROXABAN (XARELTO) VTE STARTER PACK (15 & 20 MG): ORAL_TABLET | ORAL | 0 refills | Status: DC

## 2018-04-19 NOTE — Patient Instructions (Signed)
Rivaroxaban oral tablets What is this medicine? RIVAROXABAN (ri va ROX a ban) is an anticoagulant (blood thinner). It is used to treat blood clots in the lungs or in the veins. It is also used after knee or hip surgeries to prevent blood clots. It is also used to lower the chance of stroke in people with a medical condition called atrial fibrillation. This medicine may be used for other purposes; ask your health care provider or pharmacist if you have questions. COMMON BRAND NAME(S): Xarelto, Xarelto Starter Pack What should I tell my health care provider before I take this medicine? They need to know if you have any of these conditions: -bleeding disorders -bleeding in the brain -blood in your stools (black or tarry stools) or if you have blood in your vomit -history of stomach bleeding -kidney disease -liver disease -low blood counts, like low white cell, platelet, or red cell counts -recent or planned spinal or epidural procedure -take medicines that treat or prevent blood clots -an unusual or allergic reaction to rivaroxaban, other medicines, foods, dyes, or preservatives -pregnant or trying to get pregnant -breast-feeding How should I use this medicine? Take this medicine by mouth with a glass of water. Follow the directions on the prescription label. Take your medicine at regular intervals. Do not take it more often than directed. Do not stop taking except on your doctor's advice. Stopping this medicine may increase your risk of a blood clot. Be sure to refill your prescription before you run out of medicine. If you are taking this medicine after hip or knee replacement surgery, take it with or without food. If you are taking this medicine for atrial fibrillation, take it with your evening meal. If you are taking this medicine to treat blood clots, take it with food at the same time each day. If you are unable to swallow your tablet, you may crush the tablet and mix it in applesauce. Then,  immediately eat the applesauce. You should eat more food right after you eat the applesauce containing the crushed tablet. Talk to your pediatrician regarding the use of this medicine in children. Special care may be needed. Overdosage: If you think you have taken too much of this medicine contact a poison control center or emergency room at once. NOTE: This medicine is only for you. Do not share this medicine with others. What if I miss a dose? If you take your medicine once a day and miss a dose, take the missed dose as soon as you remember. If it is almost time for your next dose, take only that dose. Do not take double or extra doses. If you take your medicine twice a day and miss a dose, take the missed dose immediately. In this instance, 2 tablets may be taken at the same time. The next day you should take 1 tablet twice a day as directed. What may interact with this medicine? Do not take this medicine with any of the following medications: -defibrotide This medicine may also interact with the following medications: -aspirin and aspirin-like medicines -certain antibiotics like erythromycin, azithromycin, and clarithromycin -certain medicines for fungal infections like ketoconazole and itraconazole -certain medicines for irregular heart beat like amiodarone, quinidine, dronedarone -certain medicines for seizures like carbamazepine, phenytoin -certain medicines that treat or prevent blood clots like warfarin, enoxaparin, and dalteparin -conivaptan -felodipine -indinavir -lopinavir; ritonavir -NSAIDS, medicines for pain and inflammation, like ibuprofen or naproxen -ranolazine -rifampin -ritonavir -SNRIs, medicines for depression, like desvenlafaxine, duloxetine, levomilnacipran, venlafaxine -SSRIs, medicines  for depression, like citalopram, escitalopram, fluoxetine, fluvoxamine, paroxetine, sertraline -St. John's wort -verapamil This list may not describe all possible interactions.  Give your health care provider a list of all the medicines, herbs, non-prescription drugs, or dietary supplements you use. Also tell them if you smoke, drink alcohol, or use illegal drugs. Some items may interact with your medicine. What should I watch for while using this medicine? Visit your healthcare professional for regular checks on your progress. You may need blood work done while you are taking this medicine. Your condition will be monitored carefully while you are receiving this medicine. It is important not to miss any appointments. Avoid sports and activities that might cause injury while you are using this medicine. Severe falls or injuries can cause unseen bleeding. Be careful when using sharp tools or knives. Consider using an Copy. Take special care brushing or flossing your teeth. Report any injuries, bruising, or red spots on the skin to your healthcare professional. If you are going to need surgery or other procedure, tell your healthcare professional that you are taking this medicine. Wear a medical ID bracelet or chain. Carry a card that describes your disease and details of your medicine and dosage times. What side effects may I notice from receiving this medicine? Side effects that you should report to your doctor or health care professional as soon as possible: -allergic reactions like skin rash, itching or hives, swelling of the face, lips, or tongue -back pain -redness, blistering, peeling or loosening of the skin, including inside the mouth -signs and symptoms of bleeding such as bloody or black, tarry stools; red or dark-brown urine; spitting up blood or brown material that looks like coffee grounds; red spots on the skin; unusual bruising or bleeding from the eye, gums, or nose -signs and symptoms of a blood clot such as chest pain; shortness of breath; pain, swelling, or warmth in the leg -signs and symptoms of a stroke such as changes in vision; confusion; trouble  speaking or understanding; severe headaches; sudden numbness or weakness of the face, arm or leg; trouble walking; dizziness; loss of coordination Side effects that usually do not require medical attention (report to your doctor or health care professional if they continue or are bothersome): -dizziness -muscle pain This list may not describe all possible side effects. Call your doctor for medical advice about side effects. You may report side effects to FDA at 1-800-FDA-1088. Where should I keep my medicine? Keep out of the reach of children. Store at room temperature between 15 and 30 degrees C (59 and 86 degrees F). Throw away any unused medicine after the expiration date. NOTE: This sheet is a summary. It may not cover all possible information. If you have questions about this medicine, talk to your doctor, pharmacist, or health care provider.  2019 [employer] (2017-03-11 11:37:12)

## 2018-04-19 NOTE — Progress Notes (Signed)
Bilateral lower extremities venous duplex exam completed. 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 More details please see preliminary notes on CV PROC under chart review.  Result called ordering physician's office spoke with RN Mateo Flow, and patient was instructed to go back to the office. Nolah Krenzer H Danicia Terhaar(RDMS RVT) 04/19/18 1:42 PM

## 2018-04-20 ENCOUNTER — Telehealth: Payer: Self-pay | Admitting: Adult Health

## 2018-04-20 NOTE — Assessment & Plan Note (Signed)
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    1. Stage III squamous cell of the lung: Reviewed patient with Dr. Julien Nordmann.  No clinical signs of progression.  To f/u on 04/28/2018 weeks for labs, evaluation by Freida Busman NP, and next treatment.  Dr. Julien Nordmann noted he would like to accompany Lisa [name] at this visit since he hasn't seen patient in a few visits.   2. Chronic bilateral DVTs: reviewed with Dr. Julien Nordmann.  Patient to start Xarelto for three months.  I sent in a started tablet pack and reviewed with her and her son in detail that she will take 15 mg PO BID x 3 weeks, then go to 20mg  daily.  I reviewed risks and benefits.  The main risk is bleeding.  I reviewed red flags.  She verbalized understanding that she is at increased risk of bleeding and that she and her son need to be cautious and careful to prevent any falls.  She knows to let us know if she develops easy bruising and bleeding, or any other concerns with taking the Xarelto.

## 2018-04-20 NOTE — Telephone Encounter (Signed)
Per 1/21 no los

## 2018-04-20 NOTE — Progress Notes (Signed)
[facility] Follow up:    Lisa Ghent, MD [place] [address]   DIAGNOSIS: Cancer Staging No matching staging information was found for the patient.  SUMMARY OF ONCOLOGIC HISTORY: Oncology History   Patient presented with increasing cough and CP.  Work up showed right middle lung cavitary mass.      Stage III squamous cell carcinoma of right lung ([place])   10/26/2017 Imaging    CT Chest IMPRESSION: Right middle lobe central thick walled cavitary lung mass is identified. In the absence of signs or symptoms of pneumonia findings are worrisome for a necrotic tumor. Associated postobstructive pneumonitis of the right middle lobe is noted    11/08/2017 Imaging    PET scan IMPRESSION: Large cavitary right middle lobe lung mass is hypermetabolic and consistent with primary lung neoplasm. There is also adjacent right hilar adenopathy and subcarinal adenopathy. 8 mm right supraclavicular lymph node is hypermetabolic and likely represents metastatic disease. This may be amenable to ultrasound-guided biopsy.    11/23/2017 Procedure    Bronchoscopy    11/25/2017 Pathology Results    Lung, biopsy, RML - SQUAMOUS CELL CARCINOMA - SEE COMMENT Microscopic Comment Based on the biopsy the carcinoma appears moderate to poorly differentiated. By immunohistochemistry, the neoplasm is positive for cytokeratin 5/6 and p63 but negative for TTF1, supporting the diagnosis of squamous cell carcinoma.     12/02/2017 Initial Diagnosis    Stage III squamous cell carcinoma of right lung ([place])    12/13/2017 - 01/23/2018 Chemotherapy    The patient had palonosetron (ALOXI) injection 0.25 mg, 0.25 mg, Intravenous,  Once, 6 of 6 cycles Administration: 0.25 mg (12/13/2017), 0.25 mg (01/10/2018), 0.25 mg (01/17/2018), 0.25 mg (12/20/2017), 0.25 mg (12/27/2017), 0.25 mg (01/03/2018) CARBOplatin (PARAPLATIN) 120 mg in sodium chloride 0.9 % 100 mL chemo infusion,  120 mg (100 % of original dose 117.2 mg), Intravenous,  Once, 6 of 6 cycles Dose modification: 117.2 mg (original dose 117.2 mg, Cycle 1), 152.4 mg (original dose 117.2 mg, Cycle 6, Reason: Provider Judgment) Administration: 120 mg (12/13/2017), 120 mg (01/10/2018), 120 mg (12/20/2017), 120 mg (12/27/2017), 120 mg (01/03/2018) PACLitaxel (TAXOL) 84 mg in sodium chloride 0.9 % 250 mL chemo infusion (</= 80mg /m2), 45 mg/m2 = 84 mg, Intravenous,  Once, 6 of 6 cycles Administration: 84 mg (12/13/2017), 84 mg (01/10/2018), 84 mg (01/17/2018), 84 mg (12/20/2017), 84 mg (12/27/2017), 84 mg (01/03/2018)  for chemotherapy treatment.     03/04/2018 -  Chemotherapy    The patient had durvalumab (IMFINZI) 740 mg in sodium chloride 0.9 % 100 mL chemo infusion, 10 mg/kg = 740 mg, Intravenous,  Once, 4 of 10 cycles Administration: 740 mg (03/04/2018), 740 mg (03/18/2018), 740 mg (03/31/2018), 740 mg (04/13/2018)  for chemotherapy treatment.      CURRENT THERAPY: Imfinzi every two weeks  INTERVAL HISTORY: [name] 85 y.o. female returns for urgent evaluation for newly positive doppler showing bilateral chronic DVTs.  She is accompanied by her son.  I saw Hava on 04/13/2018 and noted bilateral lower extremity edema, right greater than left.  She hadn't been aware or noted swelling and the difference in lower extremity swelling previously, and notes she has never had evaluation of this.  She notes that she has never had any surgeries on her knees, and has never been on a blood thinner before.     Patient Active Problem List   Diagnosis Date Noted  . Encounter for antineoplastic immunotherapy  02/16/2018  . Stage III squamous cell carcinoma of right lung ([place]) 12/02/2017  . Encounter for antineoplastic chemotherapy 12/02/2017  . Goals of care, counseling/discussion 12/02/2017  . Vitamin D deficiency 06/19/2016  . Elevated serum creatinine 06/19/2016  . Advance care planning 06/08/2014  . Medicare annual wellness  visit, subsequent 05/19/2012  . Dysuria 11/26/2010  . Bilateral leg edema 11/26/2010  . OBESITY 12/15/2006  . Hypothyroidism 10/29/1995  . Essential hypertension 10/29/1995  . HLD (hyperlipidemia) 01/29/1991    is allergic to atorvastatin; celebrex [celecoxib]; cholecalciferol; and simvastatin.  MEDICAL HISTORY: Past Medical History:  Diagnosis Date  . Diverticulosis   . Headache   . Hemorrhoids    internal and external  . Hyperlipidemia 01/29/1991  . Hypertension 10/29/1995  . Hypothyroidism 10/29/1995  . Lung mass    right middle lobe  . Melanoma ([place])    h/o, local excision. no chemo or rady.   . Skin cancer (melanoma) ([place])   . Tubular adenoma of colon   . Wears dentures     SURGICAL HISTORY: Past Surgical History:  Procedure Laterality Date  . BRONCHIAL BIOPSY  11/23/2017   Procedure: BRONCHIAL BIOPSIES;  Surgeon: Grace Isaac, MD;  Location: [facility] OR;  Service: Thoracic;;  . cataract surgery  2007, 2008   repair bilat  . DG BARIUM ENEMA ([place] HX) N/A 2016   [name]  . HERNIA REPAIR  04/25/2001  . IR US GUIDE BX ASP/DRAIN  11/17/2017  . KNEE SURGERY     meniscus tear per Dr. Ronnie Derby, R knee  . MULTIPLE TOOTH EXTRACTIONS    . SEPTOPLASTY  2006   Dr. Ernesto Rutherford  . SKIN CANCER EXCISION    . TONSILLECTOMY  1954  . VAGINAL HYSTERECTOMY    . VIDEO BRONCHOSCOPY WITH ENDOBRONCHIAL ULTRASOUND N/A 11/23/2017   Procedure: VIDEO BRONCHOSCOPY WITH ENDOBRONCHIAL ULTRASOUND;  Surgeon: Grace Isaac, MD;  Location: [facility];  Service: Thoracic;  Laterality: N/A;    SOCIAL HISTORY: Social History   Socioeconomic History  . Marital status: Married    Spouse name: Not on file  . Number of children: 2  . Years of education: Not on file  . Highest education level: Not on file  Occupational History  . 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 Financial resource strain: Not on file  . Food insecurity:    Worry:  Not on file    Inability: Not on file  . Transportation needs:    Medical: Not on file    Non-medical: Not on file  Tobacco Use  . Smoking status: Former Smoker    Packs/day: 1.00    Years: 30.00    Pack years: 30.00    Types: Cigarettes    Start date: 10/28/1958    Last attempt to quit: 10/27/1988    Years since quitting: 29.4  . Smokeless tobacco: Never Used  . Tobacco comment: quit 1994  Substance and Sexual Activity  . Alcohol use: No  . Drug use: No  . Sexual activity: Never  Lifestyle  . Physical activity:    Days per week: Not on file    Minutes per session: Not on file  . Stress: Not on file  Relationships  . Social connections:    Talks on phone: Not on file    Gets together: Not on file    Attends religious service: Not on file    Active member of club or  organization: Not on file    Attends meetings of clubs or organizations: Not on file    Relationship status: Not on file  . Intimate partner violence:    Fear of current or ex partner: Not on file    Emotionally abused: Not on file    Physically abused: Not on file    Forced sexual activity: Not on file  Other Topics Concern  . Not on file  Social History Narrative   Married 1957 lives with husband    FAMILY HISTORY: Family History  Problem Relation Age of Onset  . Stroke Mother   . Heart disease Mother        CHF  . Stomach cancer Sister   . Cancer Brother        Metastatic cancer  . Colon cancer Neg Hx   . Breast cancer Neg Hx     Review of Systems  Constitutional: Negative for appetite change, chills, fatigue, fever and unexpected weight change.  HENT:   Negative for hearing loss, lump/mass, mouth sores and trouble swallowing.   Eyes: Negative for eye problems and icterus.  Respiratory: Negative for chest tightness, cough and shortness of breath.   Cardiovascular: Positive for leg swelling. Negative for chest pain and palpitations.  Gastrointestinal: Negative for abdominal distention,  abdominal pain, constipation, diarrhea, nausea and vomiting.  Endocrine: Negative for hot flashes.  Musculoskeletal: Negative for arthralgias.  Skin: Negative for itching and rash.  Neurological: Negative for dizziness, extremity weakness, headaches and numbness.  Hematological: Negative for adenopathy. Does not bruise/bleed easily.  Psychiatric/Behavioral: Negative for depression. The patient is not nervous/anxious.       PHYSICAL EXAMINATION  ECOG PERFORMANCE STATUS: 1 - Symptomatic but completely ambulatory  Vitals:   04/19/18 1419  BP: 126/81  Pulse: (!) 104  Resp: 18  Temp: (!) 97.1 F (36.2 C)  SpO2: 96%    Physical Exam Constitutional:      General: She is not in acute distress.    Appearance: Normal appearance.  HENT:     Head: Normocephalic and atraumatic.     Mouth/Throat:     Mouth: Mucous membranes are moist.     Pharynx: Oropharynx is clear. No oropharyngeal exudate.  Eyes:     Pupils: Pupils are equal, round, and reactive to light.  Neck:     Musculoskeletal: Normal range of motion and neck supple.  Cardiovascular:     Rate and Rhythm: Normal rate and regular rhythm.     Pulses: Normal pulses.     Heart sounds: Normal heart sounds.  Pulmonary:     Effort: Pulmonary effort is normal.     Breath sounds: Normal breath sounds.  Abdominal:     General: Abdomen is flat. Bowel sounds are normal.     Palpations: Abdomen is soft.  Musculoskeletal:        General: Swelling (bilateral lower extremity) present.  Lymphadenopathy:     Cervical: No cervical adenopathy.  Skin:    General: Skin is warm and dry.     Capillary Refill: Capillary refill takes less than 2 seconds.     Findings: No rash.  Neurological:     General: No focal deficit present.     Mental Status: She is alert.  Psychiatric:        Mood and Affect: Mood normal.        Behavior: Behavior normal.     LABORATORY DATA:  CBC    Component Value Date/Time   WBC 4.7 04/13/2018  0823    WBC 6.7 11/17/2017 1149   RBC 3.90 04/13/2018 0823   HGB 11.4 (L) 04/13/2018 0823   HCT 37.1 04/13/2018 0823   PLT 135 (L) 04/13/2018 0823   MCV 95.1 04/13/2018 0823   MCH 29.2 04/13/2018 0823   MCHC 30.7 04/13/2018 0823   RDW 14.1 04/13/2018 0823   LYMPHSABS 1.0 04/13/2018 0823   MONOABS 0.5 04/13/2018 0823   EOSABS 0.2 04/13/2018 0823   BASOSABS 0.0 04/13/2018 0823    CMP     Component Value Date/Time   NA 143 04/13/2018 0823   K 3.7 04/13/2018 0823   CL 107 04/13/2018 0823   CO2 26 04/13/2018 0823   GLUCOSE 74 04/13/2018 0823   BUN 22 04/13/2018 0823   CREATININE 1.49 (H) 04/13/2018 0823   CALCIUM 11.2 (H) 04/13/2018 0823   PROT 6.9 04/13/2018 0823   ALBUMIN 3.1 (L) 04/13/2018 0823   AST 18 04/13/2018 0823   ALT 10 04/13/2018 0823   ALKPHOS 86 04/13/2018 0823   BILITOT 0.5 04/13/2018 0823   GFRNONAA 32 (L) 04/13/2018 0823   GFRAA 37 (L) 04/13/2018 0823       PENDING LABS:   RADIOGRAPHIC STUDIES:  Vas [place] Lower Extremity Venous (dvt)  Result Date: 04/19/2018  Lower Venous Study Indications: Swelling, and Pain. Other Indications: Stage III Squamous cell carcinoma on right lung. Risk Factors: Cancer Tubular adenoma of Colon, Melanoma. Limitations: Body habitus and musculoskeletal features. Comparison Study: Lower extremity venous duplex exam on 11/18/2012. Performing Technologist: Rudell Cobb  Examination Guidelines: A complete evaluation includes B-mode imaging, spectral Doppler, color Doppler, and power Doppler as needed of all accessible portions of each vessel. Bilateral testing is considered an integral part of a complete examination. Limited examinations for reoccurring indications may be performed as noted.  Right Venous Findings: +---------+---------------+---------+-----------+----------+-------------------+          CompressibilityPhasicitySpontaneityPropertiesSummary              +---------+---------------+---------+-----------+----------+-------------------+ CFV      Full                                                             +---------+---------------+---------+-----------+----------+-------------------+ SFJ      Full                                                             +---------+---------------+---------+-----------+----------+-------------------+ FV Prox  Full                                                             +---------+---------------+---------+-----------+----------+-------------------+ FV Mid   Full                                                             +---------+---------------+---------+-----------+----------+-------------------+  FV DistalFull                                                             +---------+---------------+---------+-----------+----------+-------------------+ PFV      Full                                                             +---------+---------------+---------+-----------+----------+-------------------+ POP      Full           Yes      Yes                  poor flow           +---------+---------------+---------+-----------+----------+-------------------+ PTV      Partial                                      Chronic             +---------+---------------+---------+-----------+----------+-------------------+ PERO                                                  not well visualized                                                       with compression    +---------+---------------+---------+-----------+----------+-------------------+  Left Venous Findings: +---------+---------------+---------+-----------+----------+-------+          CompressibilityPhasicitySpontaneityPropertiesSummary +---------+---------------+---------+-----------+----------+-------+ CFV      Full           Yes      Yes                           +---------+---------------+---------+-----------+----------+-------+ SFJ      Full                                                 +---------+---------------+---------+-----------+----------+-------+ FV Prox  Full                                                 +---------+---------------+---------+-----------+----------+-------+ FV Mid   Full                                                 +---------+---------------+---------+-----------+----------+-------+ FV DistalFull                                                 +---------+---------------+---------+-----------+----------+-------+  PFV      Full                                                 +---------+---------------+---------+-----------+----------+-------+ POP      Full           Yes      Yes                          +---------+---------------+---------+-----------+----------+-------+ PTV      Full                                                 +---------+---------------+---------+-----------+----------+-------+ PERO     Partial                                      Chronic +---------+---------------+---------+-----------+----------+-------+ Gastroc  Full                                                 +---------+---------------+---------+-----------+----------+-------+    Summary: Right: Findings consistent with chronic deep vein thrombosis involving the right posterior tibial veins with partially patent. No cystic structure found in the popliteal fossa. Left: Findings consistent with chronic deep vein thrombosis involving the left peroneal veins with partially patent. No cystic structure found in the popliteal fossa.  *See table(s) above for measurements and observations. Electronically signed by Deitra Mayo MD on 04/19/2018 at 1:46:30 PM.    Final           ASSESSMENT and THERAPY PLAN:   Stage III squamous cell carcinoma of right lung ([place]) Kerianne Gurr is an 85 year old  woman with stage III squamous cell carcinoma of the right lung here today for evaluation prior to receiving Imfinzi.    1. Stage III squamous cell of the lung: Reviewed patient with Dr. Julien Nordmann.  No clinical signs of progression.  To f/u on 04/28/2018 weeks for labs, evaluation by Freida Busman NP, and next treatment.  Dr. Julien Nordmann noted he would like to accompany Erasmo Downer at this visit since he hasn't seen patient in a few visits.   2. Chronic bilateral DVTs: reviewed with Dr. Julien Nordmann.  Patient to start Xarelto for three months.  I sent in a started tablet pack and reviewed with her and her son in detail that she will take 15 mg PO BID x 3 weeks, then go to 20mg  daily.  I reviewed risks and benefits.  The main risk is bleeding.  I reviewed red flags.  She verbalized understanding that she is at increased risk of bleeding and that she and her son need to be cautious and careful to prevent any falls.  She knows to let us know if she develops easy bruising and bleeding, or any other concerns with taking the Xarelto.      All questions were answered. The patient knows to call the clinic with any problems, questions or concerns. We can certainly see the patient much sooner if necessary.  A total  of (30) minutes of face-to-face time was spent with this patient with greater than 50% of that time in counseling and care-coordination.  This note was electronically signed. Scot Dock, NP 04/20/2018

## 2018-04-28 ENCOUNTER — Inpatient Hospital Stay (HOSPITAL_BASED_OUTPATIENT_CLINIC_OR_DEPARTMENT_OTHER): Payer: Medicare Other | Admitting: Oncology

## 2018-04-28 ENCOUNTER — Inpatient Hospital Stay: Payer: Medicare Other

## 2018-04-28 ENCOUNTER — Encounter: Payer: Self-pay | Admitting: Oncology

## 2018-04-28 VITALS — BP 123/85 | HR 99 | Temp 97.8°F | Resp 19 | Ht 65.0 in | Wt 157.7 lb

## 2018-04-28 DIAGNOSIS — Z79899 Other long term (current) drug therapy: Secondary | ICD-10-CM

## 2018-04-28 DIAGNOSIS — Z5112 Encounter for antineoplastic immunotherapy: Secondary | ICD-10-CM | POA: Diagnosis not present

## 2018-04-28 DIAGNOSIS — Z85820 Personal history of malignant melanoma of skin: Secondary | ICD-10-CM

## 2018-04-28 DIAGNOSIS — Z923 Personal history of irradiation: Secondary | ICD-10-CM | POA: Diagnosis not present

## 2018-04-28 DIAGNOSIS — I10 Essential (primary) hypertension: Secondary | ICD-10-CM

## 2018-04-28 DIAGNOSIS — R5383 Other fatigue: Secondary | ICD-10-CM | POA: Diagnosis not present

## 2018-04-28 DIAGNOSIS — C3491 Malignant neoplasm of unspecified part of right bronchus or lung: Secondary | ICD-10-CM

## 2018-04-28 DIAGNOSIS — E785 Hyperlipidemia, unspecified: Secondary | ICD-10-CM | POA: Diagnosis not present

## 2018-04-28 DIAGNOSIS — Z9221 Personal history of antineoplastic chemotherapy: Secondary | ICD-10-CM | POA: Diagnosis not present

## 2018-04-28 DIAGNOSIS — F329 Major depressive disorder, single episode, unspecified: Secondary | ICD-10-CM

## 2018-04-28 DIAGNOSIS — R630 Anorexia: Secondary | ICD-10-CM | POA: Diagnosis not present

## 2018-04-28 DIAGNOSIS — I82503 Chronic embolism and thrombosis of unspecified deep veins of lower extremity, bilateral: Secondary | ICD-10-CM | POA: Diagnosis not present

## 2018-04-28 DIAGNOSIS — I82409 Acute embolism and thrombosis of unspecified deep veins of unspecified lower extremity: Secondary | ICD-10-CM | POA: Insufficient documentation

## 2018-04-28 DIAGNOSIS — R634 Abnormal weight loss: Secondary | ICD-10-CM | POA: Diagnosis not present

## 2018-04-28 DIAGNOSIS — R590 Localized enlarged lymph nodes: Secondary | ICD-10-CM

## 2018-04-28 DIAGNOSIS — I825Z3 Chronic embolism and thrombosis of unspecified deep veins of distal lower extremity, bilateral: Secondary | ICD-10-CM

## 2018-04-28 DIAGNOSIS — Z87891 Personal history of nicotine dependence: Secondary | ICD-10-CM

## 2018-04-28 DIAGNOSIS — R600 Localized edema: Secondary | ICD-10-CM

## 2018-04-28 DIAGNOSIS — Z7901 Long term (current) use of anticoagulants: Secondary | ICD-10-CM

## 2018-04-28 DIAGNOSIS — Z800 Family history of malignant neoplasm of digestive organs: Secondary | ICD-10-CM

## 2018-04-28 DIAGNOSIS — E876 Hypokalemia: Secondary | ICD-10-CM | POA: Insufficient documentation

## 2018-04-28 DIAGNOSIS — C342 Malignant neoplasm of middle lobe, bronchus or lung: Secondary | ICD-10-CM

## 2018-04-28 DIAGNOSIS — E860 Dehydration: Secondary | ICD-10-CM | POA: Insufficient documentation

## 2018-04-28 DIAGNOSIS — E039 Hypothyroidism, unspecified: Secondary | ICD-10-CM | POA: Diagnosis not present

## 2018-04-28 LAB — CBC WITH DIFFERENTIAL (CANCER CENTER ONLY)
Abs Immature Granulocytes: 0.02 10*3/uL (ref 0.00–0.07)
Basophils Absolute: 0 10*3/uL (ref 0.0–0.1)
Basophils Relative: 1 %
Eosinophils Absolute: 0.1 10*3/uL (ref 0.0–0.5)
Eosinophils Relative: 2 %
HCT: 36.4 % (ref 36.0–46.0)
Hemoglobin: 11.6 g/dL — ABNORMAL LOW (ref 12.0–15.0)
Immature Granulocytes: 0 %
Lymphocytes Relative: 15 %
Lymphs Abs: 1 10*3/uL (ref 0.7–4.0)
MCH: 29.1 pg (ref 26.0–34.0)
MCHC: 31.9 g/dL (ref 30.0–36.0)
MCV: 91.2 fL (ref 80.0–100.0)
Monocytes Absolute: 0.5 10*3/uL (ref 0.1–1.0)
Monocytes Relative: 8 %
Neutro Abs: 4.7 10*3/uL (ref 1.7–7.7)
Neutrophils Relative %: 74 %
Platelet Count: 181 10*3/uL (ref 150–400)
RBC: 3.99 MIL/uL (ref 3.87–5.11)
RDW: 13.3 % (ref 11.5–15.5)
WBC Count: 6.3 10*3/uL (ref 4.0–10.5)
nRBC: 0 % (ref 0.0–0.2)

## 2018-04-28 LAB — CMP (CANCER CENTER ONLY)
ALT: 8 U/L (ref 0–44)
AST: 19 U/L (ref 15–41)
Albumin: 3.2 g/dL — ABNORMAL LOW (ref 3.5–5.0)
Alkaline Phosphatase: 83 U/L (ref 38–126)
Anion gap: 10 (ref 5–15)
BUN: 27 mg/dL — ABNORMAL HIGH (ref 8–23)
CO2: 27 mmol/L (ref 22–32)
Calcium: 13.2 mg/dL (ref 8.9–10.3)
Chloride: 102 mmol/L (ref 98–111)
Creatinine: 1.54 mg/dL — ABNORMAL HIGH (ref 0.44–1.00)
GFR, Est AFR Am: 36 mL/min — ABNORMAL LOW (ref >60)
GFR, Estimated: 31 mL/min — ABNORMAL LOW (ref >60)
Glucose, Bld: 94 mg/dL (ref 70–99)
Potassium: 3.1 mmol/L — ABNORMAL LOW (ref 3.5–5.1)
Sodium: 139 mmol/L (ref 135–145)
Total Bilirubin: 0.4 mg/dL (ref 0.3–1.2)
Total Protein: 7 g/dL (ref 6.5–8.1)

## 2018-04-28 LAB — TSH: TSH: 0.946 u[IU]/mL (ref 0.308–3.960)

## 2018-04-28 MED ORDER — POTASSIUM CHLORIDE CRYS ER 20 MEQ PO TBCR: 20.0000 meq | EXTENDED_RELEASE_TABLET | Freq: Two times a day (BID) | ORAL | 0 refills | Status: DC

## 2018-04-28 MED ORDER — POTASSIUM CHLORIDE IN NACL 20-0.9 MEQ/L-% IV SOLN
Freq: Once | INTRAVENOUS | Status: AC
  Administered 2018-04-28: 13:00:00 via INTRAVENOUS
  Filled 2018-04-28: qty 1000

## 2018-04-28 MED ORDER — SODIUM CHLORIDE 0.9 % IV SOLN: INTRAVENOUS | Status: DC

## 2018-04-28 MED ORDER — ZOLEDRONIC ACID 4 MG/100ML IV SOLN
4.0000 mg | Freq: Once | INTRAVENOUS | Status: AC
  Administered 2018-04-28: 4 mg via INTRAVENOUS
  Filled 2018-04-28: qty 100

## 2018-04-28 MED ORDER — SODIUM CHLORIDE 0.9 % IV SOLN
10.0000 mg/kg | Freq: Once | INTRAVENOUS | Status: AC
  Administered 2018-04-28: 740 mg via INTRAVENOUS
  Filled 2018-04-28: qty 10

## 2018-04-28 MED ORDER — SODIUM CHLORIDE 0.9 % IV SOLN
Freq: Once | INTRAVENOUS | Status: AC
  Administered 2018-04-28: 13:00:00 via INTRAVENOUS
  Filled 2018-04-28: qty 250

## 2018-04-28 MED ORDER — RIVAROXABAN 20 MG PO TABS: 20.0000 mg | ORAL_TABLET | Freq: Every day | ORAL | 1 refills | Status: DC

## 2018-04-28 NOTE — Progress Notes (Signed)
[facility] OFFICE PROGRESS NOTE  [name], MD [place] [address]  DIAGNOSIS:  1) Stage IIIB (T3, N3, M0) non-small cell lung cancer, squamous cell carcinoma presented with large right middle lobe lung mass in addition to right hilar, subcarinal and right supraclavicular lymphadenopathy diagnosed in August 2019. 2) Chronic DVT in the bilateral lower extremities diagnosed January 2020.  PRIOR THERAPY: Concurrent chemoradiation with weekly carboplatin for AUC of 2 and paclitaxel 45 NG/M2.  Status post 6 cycles with partial response.  CURRENT THERAPY:  1) Consolidation immunotherapy with Imfinzi 10 mg/KG every 2 weeks started February 28, 2018.  Status post 4 cycles. 2) She is on Xarelto for her DVT.  Started January 2020.  INTERVAL HISTORY: Lisa [name] 85 y.o. female returns for a routine follow-up visit accompanied by a family member.  Patient reports that she is having increased fatigue.  She denies fevers and chills.  Denies chest pain, cough, hemoptysis.  She has her baseline shortness of breath.  Denies nausea, vomiting, constipation, diarrhea.  She is gaining back some of her lost weight.  She is on Xarelto for her recent diagnosis of DVT.  Denies bleeding.  She has been tolerating her treatment with Imfinzi fairly well.  The patient is here for evaluation prior to cycle #5 of Imfinzi.  MEDICAL HISTORY: Past Medical History:  Diagnosis Date  . Diverticulosis   . Headache   . Hemorrhoids    internal and external  . Hyperlipidemia 01/29/1991  . Hypertension 10/29/1995  . Hypothyroidism 10/29/1995  . Lung mass    right middle lobe  . Melanoma ([place])    h/o, local excision. no chemo or rady.   . Skin cancer (melanoma) ([facility])   . Tubular adenoma of colon   . Wears dentures     ALLERGIES:  is allergic to atorvastatin; celebrex [celecoxib]; cholecalciferol; and simvastatin.  MEDICATIONS:  Current Outpatient Medications   Medication Sig Dispense Refill  . amLODipine (NORVASC) 10 MG tablet Take 1 tablet (10 mg total) by mouth daily. 90 tablet 3  . Cholecalciferol (VITAMIN D) 2000 units tablet Take 2,000 Units by mouth 2 (two) times daily.    . Coenzyme Q10 100 MG capsule Take 100 mg by mouth daily.     Marland Kitchen docusate sodium (COLACE) 100 MG capsule Take 100 mg by mouth daily as needed for mild constipation.    . fluticasone (FLONASE) 50 MCG/ACT nasal spray USE 2 SPRAYS IN EACH NOSTRIL DAILY AS NEEDED FOR RHINITIS 48 g 3  . levothyroxine (SYNTHROID, LEVOTHROID) 75 MCG tablet Take 1 tablet (75 mcg total) by mouth daily. 90 tablet 3  . Melatonin 3 MG TABS Take 1 tablet by mouth at bedtime as needed.    . mirtazapine (REMERON) 7.5 MG tablet Take 1 tablet (7.5 mg total) by mouth at bedtime. 30 tablet 5  . potassium chloride SA (K-DUR,KLOR-CON) 20 MEQ tablet Take 1 tablet (20 mEq total) by mouth 2 (two) times daily. 30 tablet 0  . prochlorperazine (COMPAZINE) 10 MG tablet Take 1 tablet (10 mg total) by mouth every 6 (six) hours as needed for nausea or vomiting. 30 tablet 0  . rivaroxaban (XARELTO) 20 MG TABS tablet Take 1 tablet (20 mg total) by mouth daily with supper. 30 tablet 1  . Rivaroxaban 15 & 20 MG TBPK Take as directed on package: Start with one 15mg  tablet by mouth twice a day with food. On Day 22, switch to one 20mg  tablet  once a day with food. 51 each 0  . sennosides-docusate sodium (SENOKOT-S) 8.6-50 MG tablet Take 1 tablet by mouth daily as needed for constipation.    . sucralfate (CARAFATE) 1 g tablet Take 1 tablet (1 g total) by mouth 4 (four) times daily -  with meals and at bedtime. Crush and dissolve in 1 tablespoon of water prior to swallowing 30 tablet 2   No current facility-administered medications for this visit.    Facility-Administered Medications Ordered in Other Visits  Medication Dose Route Frequency Provider Last Rate Last Dose  . 0.9 % NaCl with KCl 20 mEq/ L  infusion   Intravenous Once  Curt Bears, MD      . acetaminophen (TYLENOL) tablet 650 mg  650 mg Oral Once Nicholas Lose, MD      . durvalumab (IMFINZI) 740 mg in sodium chloride 0.9 % 100 mL chemo infusion  10 mg/kg (Treatment Plan Recorded) Intravenous Once Curt Bears, MD      . Zoledronic Acid (ZOMETA) 4 mg IVPB  4 mg Intravenous Once Sheyli Horwitz, Roselie Awkward, NP        SURGICAL HISTORY:  Past Surgical History:  Procedure Laterality Date  . BRONCHIAL BIOPSY  11/23/2017   Procedure: BRONCHIAL BIOPSIES;  Surgeon: Grace Isaac, MD;  Location: [facility] OR;  Service: Thoracic;;  . cataract surgery  2007, 2008   repair bilat  . DG BARIUM ENEMA ([facility]) N/A 2016   [name]  . HERNIA REPAIR  04/25/2001  . IR US GUIDE BX ASP/DRAIN  11/17/2017  . KNEE SURGERY     meniscus tear per Dr. Ronnie Derby, R knee  . MULTIPLE TOOTH EXTRACTIONS    . SEPTOPLASTY  2006   Dr. Ernesto Rutherford  . SKIN CANCER EXCISION    . TONSILLECTOMY  1954  . VAGINAL HYSTERECTOMY    . VIDEO BRONCHOSCOPY WITH ENDOBRONCHIAL ULTRASOUND N/A 11/23/2017   Procedure: VIDEO BRONCHOSCOPY WITH ENDOBRONCHIAL ULTRASOUND;  Surgeon: Grace Isaac, MD;  Location: [facility];  Service: Thoracic;  Laterality: N/A;    REVIEW OF SYSTEMS:   Review of Systems  Constitutional: Negative for appetite change, chills, fever and unexpected weight change.  Positive for fatigue. HENT:   Negative for mouth sores, nosebleeds, sore throat and trouble swallowing.   Eyes: Negative for eye problems and icterus.  Respiratory: Negative for cough, hemoptysis, and wheezing.  She has her baseline shortness of breath. Cardiovascular: Negative for chest pain.  Positive for bilateral lower extremity edema; left greater than right. Gastrointestinal: Negative for abdominal pain, constipation, diarrhea, nausea and vomiting.  Genitourinary: Negative for bladder incontinence, difficulty urinating, dysuria, frequency and hematuria.   Musculoskeletal: Negative for back pain, gait problem, neck pain  and neck stiffness.  Skin: Negative for itching and rash.  Neurological: Negative for dizziness, extremity weakness, gait problem, headaches, light-headedness and seizures.  Hematological: Negative for adenopathy. Does not bruise/bleed easily.  Psychiatric/Behavioral: Negative for confusion, depression and sleep disturbance. The patient is not nervous/anxious.     PHYSICAL EXAMINATION:  Blood pressure 123/85, pulse 99, temperature 97.8 F (36.6 C), temperature source Oral, resp. rate 19, height 5\' 5"  (1.651 m), weight 157 lb 11.2 oz (71.5 kg), SpO2 96 %.  ECOG PERFORMANCE STATUS: 1 - Symptomatic but completely ambulatory  Physical Exam  Constitutional: Oriented to person, place, and time and well-developed, well-nourished, and in no distress. No distress.  HENT:  Head: Normocephalic and atraumatic.  Mouth/Throat: Oropharynx is clear and moist. No oropharyngeal exudate.  Eyes: Conjunctivae are normal. Right  eye exhibits no discharge. Left eye exhibits no discharge. No scleral icterus.  Neck: Normal range of motion. Neck supple.  Cardiovascular: Normal rate, regular rhythm, normal heart sounds and intact distal pulses.  Trace edema to the right lower extremity.  1+ edema to the left lower extremity. Pulmonary/Chest: Effort normal and breath sounds normal. No respiratory distress. No wheezes. No rales.  Abdominal: Soft. Bowel sounds are normal. Exhibits no distension and no mass. There is no tenderness.  Musculoskeletal: Normal range of motion. Lymphadenopathy:    No cervical adenopathy.  Neurological: Alert and oriented to person, place, and time. Exhibits normal muscle tone. Gait normal. Coordination normal.  Skin: Skin is warm and dry. No rash noted. Not diaphoretic. No erythema. No pallor.  Psychiatric: Mood, memory and judgment normal.  Vitals reviewed.  LABORATORY DATA: Lab Results  Component Value Date   WBC 6.3 04/28/2018   HGB 11.6 (L) 04/28/2018   HCT 36.4 04/28/2018   MCV  91.2 04/28/2018   PLT 181 04/28/2018      Chemistry      Component Value Date/Time   NA 139 04/28/2018 1047   K 3.1 (L) 04/28/2018 1047   CL 102 04/28/2018 1047   CO2 27 04/28/2018 1047   BUN 27 (H) 04/28/2018 1047   CREATININE 1.54 (H) 04/28/2018 1047      Component Value Date/Time   CALCIUM 13.2 (HH) 04/28/2018 1047   ALKPHOS 83 04/28/2018 1047   AST 19 04/28/2018 1047   ALT 8 04/28/2018 1047   BILITOT 0.4 04/28/2018 1047       RADIOGRAPHIC STUDIES:  Vas [place] Lower Extremity Venous (dvt)  Result Date: 04/19/2018  Lower Venous Study Indications: Swelling, and Pain. Other Indications: Stage III Squamous cell carcinoma on right lung. Risk Factors: Cancer Tubular adenoma of Colon, Melanoma. Limitations: Body habitus and musculoskeletal features. Comparison Study: Lower extremity venous duplex exam on 11/18/2012. Performing Technologist: Rudell Cobb  Examination Guidelines: A complete evaluation includes B-mode imaging, spectral Doppler, color Doppler, and power Doppler as needed of all accessible portions of each vessel. Bilateral testing is considered an integral part of a complete examination. Limited examinations for reoccurring indications may be performed as noted.  Right Venous Findings: +---------+---------------+---------+-----------+----------+-------------------+          CompressibilityPhasicitySpontaneityPropertiesSummary             +---------+---------------+---------+-----------+----------+-------------------+ CFV      Full                                                             +---------+---------------+---------+-----------+----------+-------------------+ SFJ      Full                                                             +---------+---------------+---------+-----------+----------+-------------------+ FV Prox  Full                                                              +---------+---------------+---------+-----------+----------+-------------------+  FV Mid   Full                                                             +---------+---------------+---------+-----------+----------+-------------------+ FV DistalFull                                                             +---------+---------------+---------+-----------+----------+-------------------+ PFV      Full                                                             +---------+---------------+---------+-----------+----------+-------------------+ POP      Full           Yes      Yes                  poor flow           +---------+---------------+---------+-----------+----------+-------------------+ PTV      Partial                                      Chronic             +---------+---------------+---------+-----------+----------+-------------------+ PERO                                                  not well visualized                                                       with compression    +---------+---------------+---------+-----------+----------+-------------------+  Left Venous Findings: +---------+---------------+---------+-----------+----------+-------+          CompressibilityPhasicitySpontaneityPropertiesSummary +---------+---------------+---------+-----------+----------+-------+ CFV      Full           Yes      Yes                          +---------+---------------+---------+-----------+----------+-------+ SFJ      Full                                                 +---------+---------------+---------+-----------+----------+-------+ FV Prox  Full                                                 +---------+---------------+---------+-----------+----------+-------+ FV Mid  Full                                                 +---------+---------------+---------+-----------+----------+-------+ FV DistalFull                                                  +---------+---------------+---------+-----------+----------+-------+ PFV      Full                                                 +---------+---------------+---------+-----------+----------+-------+ POP      Full           Yes      Yes                          +---------+---------------+---------+-----------+----------+-------+ PTV      Full                                                 +---------+---------------+---------+-----------+----------+-------+ PERO     Partial                                      Chronic +---------+---------------+---------+-----------+----------+-------+ Gastroc  Full                                                 +---------+---------------+---------+-----------+----------+-------+    Summary: Right: Findings consistent with chronic deep vein thrombosis involving the right posterior tibial veins with partially patent. No cystic structure found in the popliteal fossa. Left: Findings consistent with chronic deep vein thrombosis involving the left peroneal veins with partially patent. No cystic structure found in the popliteal fossa.  *See table(s) above for measurements and observations. Electronically signed by Deitra Mayo MD on 04/19/2018 at 1:46:30 PM.    Final      ASSESSMENT/PLAN:  Stage III squamous cell carcinoma of right lung [facility]) This is a very pleasant 85 year old white female with stage IIIb non-small cell lung cancer, squamous cell carcinoma.  She is currently undergoing a course of concurrent chemoradiation with weekly carboplatin and paclitaxel status post 6 cycles.  She tolerated this treatment well with partial response. She is currently undergoing consolidation treatment with immunotherapy with Imfinzi status post 4 cycles.  She is tolerating her treatment fairly well with no concerning adverse effects.  She has a recent diagnosis of chronic DVT in her bilateral lower extremities.  She  is currently on Xarelto 15 mg twice a day.  The patient was seen with Dr. Julien Nordmann.  Recommend for her to proceed with cycle #5 of Imfinzi today as scheduled.  For the DVT, she will continue on Xarelto 15 mg twice a day for the first 21 days and then change to 20  mg daily.  A refill of Xarelto was sent to her pharmacy to have on file.  Labs from today have been reviewed.  The patient appears to be dehydrated and will receive 1 L of normal saline in the infusion area today along with her immunotherapy.  Her calcium is elevated and she will also receive Zometa 4 mg IV.  We discussed stopping all calcium and vitamin D supplementation and to reduce her dairy intake. The patient also has hypokalemia and will receive potassium chloride 20 mEq in her IV fluids today.  A prescription for K-Dur 20 mEq daily was sent to her pharmacy.  The patient will follow-up in 2 weeks for evaluation prior to cycle #6 of Imfinzi and to recheck her calcium and potassium levels.  She was encouraged to increase her oral hydration.  For the depression and lack of appetite, she will continue her current treatment with Remeron.  The patient was advised to call immediately if she has any concerning symptoms in the interval. The patient voices understanding of current disease status and treatment options and is in agreement with the current care plan. All questions were answered. The patient knows to call the clinic with any problems, questions or concerns. We can certainly see the patient much sooner if necessary.   No orders of the defined types were placed in this encounter.    Mikey Bussing, DNP, AGPCNP-BC, AOCNP 04/28/18   ADDENDUM: Hematology/Oncology Attending: I had a face-to-face encounter with the patient.  I recommended her care plan.  This is a very pleasant 85 years old white female with a stage IIIb non-small cell lung cancer, squamous cell carcinoma status post concurrent chemoradiation with partial  response.  The patient is currently undergoing consolidation treatment with immunotherapy with Imfinzi status post 4 cycles.  She has been tolerating this treatment well with no concerning adverse effects. I recommended for the patient to continue her current treatment with Imfinzi and she will proceed with cycle #5 today. For the deep venous thrombosis, she will continue her current treatment with Xarelto. She will come back for follow-up visit in 2 weeks for evaluation before starting cycle #6. The patient was advised to call immediately if she has any concerning symptoms in the interval.  Disclaimer: This note was dictated with voice recognition software. Similar sounding words can inadvertently be transcribed and may be missed upon review. Eilleen Kempf, MD 04/30/18

## 2018-04-28 NOTE — Patient Instructions (Signed)
Increase water intake.  Stop taking calcium supplements and Vitamin D. Avoid dairy products.  Begin taking potassium chloride 20 MEq daily.

## 2018-04-28 NOTE — Patient Instructions (Signed)
[facility] Discharge Instructions for Patients Receiving Chemotherapy  Today you received the following chemotherapy agents :  Durvalumab,  Zometa.  To help prevent nausea and vomiting after your treatment, we encourage you to take your nausea medication as prescribed.   If you develop nausea and vomiting that is not controlled by your nausea medication, call the clinic.   BELOW ARE SYMPTOMS THAT SHOULD BE REPORTED IMMEDIATELY:  *FEVER GREATER THAN 100.5 F  *CHILLS WITH OR WITHOUT FEVER  NAUSEA AND VOMITING THAT IS NOT CONTROLLED WITH YOUR NAUSEA MEDICATION  *UNUSUAL SHORTNESS OF BREATH  *UNUSUAL BRUISING OR BLEEDING  TENDERNESS IN MOUTH AND THROAT WITH OR WITHOUT PRESENCE OF ULCERS  *URINARY PROBLEMS  *BOWEL PROBLEMS  UNUSUAL RASH Items with * indicate a potential emergency and should be followed up as soon as possible.  Feel free to call the clinic should you have any questions or concerns. The clinic phone number is [phone].  Please show the [facility] at check-in to the Emergency Department and triage nurse.   Zoledronic Acid injection (Hypercalcemia, Oncology) What is this medicine? ZOLEDRONIC ACID (ZOE le dron ik AS id) lowers the amount of calcium loss from bone. It is used to treat too much calcium in your blood from cancer. It is also used to prevent complications of cancer that has spread to the bone. This medicine may be used for other purposes; ask your health care provider or pharmacist if you have questions. COMMON BRAND NAME(S): Zometa What should I tell my health care provider before I take this medicine? They need to know if you have any of these conditions: -aspirin-sensitive asthma -cancer, especially if you are receiving medicines used to treat cancer -dental disease or wear dentures -infection -kidney disease -receiving corticosteroids like dexamethasone or prednisone -an unusual or allergic reaction to zoledronic acid,  other medicines, foods, dyes, or preservatives -pregnant or trying to get pregnant -breast-feeding How should I use this medicine? This medicine is for infusion into a vein. It is given by a health care professional in a hospital or clinic setting. Talk to your pediatrician regarding the use of this medicine in children. Special care may be needed. Overdosage: If you think you have taken too much of this medicine contact a poison control center or emergency room at once. NOTE: This medicine is only for you. Do not share this medicine with others. What if I miss a dose? It is important not to miss your dose. Call your doctor or health care professional if you are unable to keep an appointment. What may interact with this medicine? -certain antibiotics given by injection -NSAIDs, medicines for pain and inflammation, like ibuprofen or naproxen -some diuretics like bumetanide, furosemide -teriparatide -thalidomide This list may not describe all possible interactions. Give your health care provider a list of all the medicines, herbs, non-prescription drugs, or dietary supplements you use. Also tell them if you smoke, drink alcohol, or use illegal drugs. Some items may interact with your medicine. What should I watch for while using this medicine? Visit your doctor or health care professional for regular checkups. It may be some time before you see the benefit from this medicine. Do not stop taking your medicine unless your doctor tells you to. Your doctor may order blood tests or other tests to see how you are doing. Women should inform their doctor if they wish to become pregnant or think they might be pregnant. There is a potential for serious side effects to  an unborn child. Talk to your health care professional or pharmacist for more information. You should make sure that you get enough calcium and vitamin D while you are taking this medicine. Discuss the foods you eat and the vitamins you take  with your health care professional. Some people who take this medicine have severe bone, joint, and/or muscle pain. This medicine may also increase your risk for jaw problems or a broken thigh bone. Tell your doctor right away if you have severe pain in your jaw, bones, joints, or muscles. Tell your doctor if you have any pain that does not go away or that gets worse. Tell your dentist and dental surgeon that you are taking this medicine. You should not have major dental surgery while on this medicine. See your dentist to have a dental exam and fix any dental problems before starting this medicine. Take good care of your teeth while on this medicine. Make sure you see your dentist for regular follow-up appointments. What side effects may I notice from receiving this medicine? Side effects that you should report to your doctor or health care professional as soon as possible: -allergic reactions like skin rash, itching or hives, swelling of the face, lips, or tongue -anxiety, confusion, or depression -breathing problems -changes in vision -eye pain -feeling faint or lightheaded, falls -jaw pain, especially after dental work -mouth sores -muscle cramps, stiffness, or weakness -redness, blistering, peeling or loosening of the skin, including inside the mouth -trouble passing urine or change in the amount of urine Side effects that usually do not require medical attention (report to your doctor or health care professional if they continue or are bothersome): -bone, joint, or muscle pain -constipation -diarrhea -fever -hair loss -irritation at site where injected -loss of appetite -nausea, vomiting -stomach upset -trouble sleeping -trouble swallowing -weak or tired This list may not describe all possible side effects. Call your doctor for medical advice about side effects. You may report side effects to FDA at 1-800-FDA-1088. Where should I keep my medicine? This drug is given in a hospital  or clinic and will not be stored at home. NOTE: This sheet is a summary. It may not cover all possible information. If you have questions about this medicine, talk to your doctor, pharmacist, or health care provider.  2019 Elsevier/Gold Standard (2013-08-12 14:19:39)

## 2018-04-28 NOTE — Assessment & Plan Note (Signed)
This is a very pleasant 85 year old white female with stage IIIb non-small cell lung cancer, squamous cell carcinoma.  She is currently undergoing a course of concurrent chemoradiation with weekly carboplatin and paclitaxel status post 6 cycles.  She tolerated this treatment well with partial response. She is currently undergoing consolidation treatment with immunotherapy with Imfinzi status post 4 cycles.  She is tolerating her treatment fairly well with no concerning adverse effects.  She has a recent diagnosis of chronic DVT in her bilateral lower extremities.  She is currently on Xarelto 15 mg twice a day.  The patient was seen with Dr. Julien Nordmann.  Recommend for her to proceed with cycle #5 of Imfinzi today as scheduled.  For the DVT, she will continue on Xarelto 15 mg twice a day for the first 21 days and then change to 20 mg daily.  A refill of Xarelto was sent to her pharmacy to have on file.  Labs from today have been reviewed.  The patient appears to be dehydrated and will receive 1 L of normal saline in the infusion area today along with her immunotherapy.  Her calcium is elevated and she will also receive Zometa 4 mg IV.  We discussed stopping all calcium and vitamin D supplementation and to reduce her dairy intake. The patient also has hypokalemia and will receive potassium chloride 20 mEq in her IV fluids today.  A prescription for K-Dur 20 mEq daily was sent to her pharmacy.  The patient will follow-up in 2 weeks for evaluation prior to cycle #6 of Imfinzi and to recheck her calcium and potassium levels.  She was encouraged to increase her oral hydration.  For the depression and lack of appetite, she will continue her current treatment with Remeron.  The patient was advised to call immediately if she has any concerning symptoms in the interval. The patient voices understanding of current disease status and treatment options and is in agreement with the current care plan. All questions  were answered. The patient knows to call the clinic with any problems, questions or concerns. We can certainly see the patient much sooner if necessary.

## 2018-04-29 ENCOUNTER — Telehealth: Payer: Self-pay

## 2018-04-29 NOTE — Telephone Encounter (Signed)
Per 1/31 follow up calls. Was unable to reach patient or leave or leave a msg. Appointment adjusted for Cassie added to 2/26 schedule. Mailed a Risk manager with a calender enclosed

## 2018-05-11 ENCOUNTER — Telehealth: Payer: Self-pay

## 2018-05-11 ENCOUNTER — Inpatient Hospital Stay: Payer: Medicare Other

## 2018-05-11 ENCOUNTER — Inpatient Hospital Stay: Payer: Medicare Other | Attending: Internal Medicine

## 2018-05-11 ENCOUNTER — Inpatient Hospital Stay (HOSPITAL_BASED_OUTPATIENT_CLINIC_OR_DEPARTMENT_OTHER): Payer: Medicare Other | Admitting: Internal Medicine

## 2018-05-11 ENCOUNTER — Encounter: Payer: Self-pay | Admitting: Internal Medicine

## 2018-05-11 DIAGNOSIS — Z79899 Other long term (current) drug therapy: Secondary | ICD-10-CM | POA: Diagnosis not present

## 2018-05-11 DIAGNOSIS — E039 Hypothyroidism, unspecified: Secondary | ICD-10-CM | POA: Diagnosis not present

## 2018-05-11 DIAGNOSIS — C3490 Malignant neoplasm of unspecified part of unspecified bronchus or lung: Secondary | ICD-10-CM

## 2018-05-11 DIAGNOSIS — E785 Hyperlipidemia, unspecified: Secondary | ICD-10-CM | POA: Diagnosis not present

## 2018-05-11 DIAGNOSIS — Z5112 Encounter for antineoplastic immunotherapy: Secondary | ICD-10-CM | POA: Insufficient documentation

## 2018-05-11 DIAGNOSIS — C3491 Malignant neoplasm of unspecified part of right bronchus or lung: Secondary | ICD-10-CM

## 2018-05-11 DIAGNOSIS — C342 Malignant neoplasm of middle lobe, bronchus or lung: Secondary | ICD-10-CM

## 2018-05-11 DIAGNOSIS — I10 Essential (primary) hypertension: Secondary | ICD-10-CM | POA: Diagnosis not present

## 2018-05-11 DIAGNOSIS — Z85820 Personal history of malignant melanoma of skin: Secondary | ICD-10-CM | POA: Diagnosis not present

## 2018-05-11 LAB — CBC WITH DIFFERENTIAL (CANCER CENTER ONLY)
Abs Immature Granulocytes: 0.01 10*3/uL (ref 0.00–0.07)
Basophils Absolute: 0.1 10*3/uL (ref 0.0–0.1)
Basophils Relative: 1 %
Eosinophils Absolute: 0.2 10*3/uL (ref 0.0–0.5)
Eosinophils Relative: 4 %
HCT: 40.6 % (ref 36.0–46.0)
Hemoglobin: 12.6 g/dL (ref 12.0–15.0)
Immature Granulocytes: 0 %
Lymphocytes Relative: 19 %
Lymphs Abs: 1 10*3/uL (ref 0.7–4.0)
MCH: 28.3 pg (ref 26.0–34.0)
MCHC: 31 g/dL (ref 30.0–36.0)
MCV: 91.2 fL (ref 80.0–100.0)
Monocytes Absolute: 0.5 10*3/uL (ref 0.1–1.0)
Monocytes Relative: 9 %
Neutro Abs: 3.7 10*3/uL (ref 1.7–7.7)
Neutrophils Relative %: 67 %
Platelet Count: 179 10*3/uL (ref 150–400)
RBC: 4.45 MIL/uL (ref 3.87–5.11)
RDW: 13.5 % (ref 11.5–15.5)
WBC Count: 5.4 10*3/uL (ref 4.0–10.5)
nRBC: 0 % (ref 0.0–0.2)

## 2018-05-11 LAB — CMP (CANCER CENTER ONLY)
ALT: 7 U/L (ref 0–44)
AST: 18 U/L (ref 15–41)
Albumin: 3.2 g/dL — ABNORMAL LOW (ref 3.5–5.0)
Alkaline Phosphatase: 73 U/L (ref 38–126)
Anion gap: 10 (ref 5–15)
BUN: 17 mg/dL (ref 8–23)
CO2: 22 mmol/L (ref 22–32)
Calcium: 9.7 mg/dL (ref 8.9–10.3)
Chloride: 112 mmol/L — ABNORMAL HIGH (ref 98–111)
Creatinine: 1.57 mg/dL — ABNORMAL HIGH (ref 0.44–1.00)
GFR, Est AFR Am: 35 mL/min — ABNORMAL LOW (ref >60)
GFR, Estimated: 30 mL/min — ABNORMAL LOW (ref >60)
Glucose, Bld: 93 mg/dL (ref 70–99)
Potassium: 4 mmol/L (ref 3.5–5.1)
Sodium: 144 mmol/L (ref 135–145)
Total Bilirubin: 0.4 mg/dL (ref 0.3–1.2)
Total Protein: 7 g/dL (ref 6.5–8.1)

## 2018-05-11 MED ORDER — SODIUM CHLORIDE 0.9 % IV SOLN
Freq: Once | INTRAVENOUS | Status: AC
  Administered 2018-05-11: 09:00:00 via INTRAVENOUS
  Filled 2018-05-11: qty 250

## 2018-05-11 MED ORDER — SODIUM CHLORIDE 0.9 % IV SOLN
10.0000 mg/kg | Freq: Once | INTRAVENOUS | Status: AC
  Administered 2018-05-11: 740 mg via INTRAVENOUS
  Filled 2018-05-11: qty 10

## 2018-05-11 NOTE — Progress Notes (Signed)
[facility] [phone]   [phone]  OFFICE PROGRESS NOTE  [name], MD [place] [address]  DIAGNOSIS: Stage IIIB (T3, N3, M0) non-small cell lung cancer, squamous cell carcinoma presented with large right middle lobe lung mass in addition to right hilar, subcarinal and right supraclavicular lymphadenopathy diagnosed in August 2019.  PRIOR THERAPY: Concurrent chemoradiation with weekly carboplatin for AUC of 2 and paclitaxel 45 NG/M2.  Status post 6 cycles with partial response.   CURRENT THERAPY: Consolidation immunotherapy with Imfinzi 10 mg/KG every 2 weeks started February 28, 2018.  Status post 5 cycles.  INTERVAL HISTORY: Lisa [name] 85 y.o. female returns to the clinic today for follow-up visit accompanied by her grandson.  The patient is feeling fine today with no concerning complaints except for mild fatigue and shortness of breath with exertion.  She denied having any chest pain, cough or hemoptysis.  She denied having any fever or chills.  She has no nausea, vomiting, diarrhea or constipation.  She continues to tolerate her treatment with Imfinzi fairly well.  The patient is here today for evaluation before starting cycle #6 of her treatment.  MEDICAL HISTORY: Past Medical History:  Diagnosis Date  . Diverticulosis   . Headache   . Hemorrhoids    internal and external  . Hyperlipidemia 01/29/1991  . Hypertension 10/29/1995  . Hypothyroidism 10/29/1995  . Lung mass    right middle lobe  . Melanoma ([place])    h/o, local excision. no chemo or rady.   . Skin cancer (melanoma) ([place])   . Tubular adenoma of colon   . Wears dentures     ALLERGIES:  is allergic to atorvastatin; celebrex [celecoxib]; cholecalciferol; and simvastatin.  MEDICATIONS:  Current Outpatient Medications  Medication Sig Dispense Refill  . amLODipine (NORVASC) 10 MG tablet Take 1 tablet (10 mg total) by mouth daily. 90 tablet  3  . Cholecalciferol (VITAMIN D) 2000 units tablet Take 2,000 Units by mouth 2 (two) times daily.    . Coenzyme Q10 100 MG capsule Take 100 mg by mouth daily.     Marland Kitchen docusate sodium (COLACE) 100 MG capsule Take 100 mg by mouth daily as needed for mild constipation.    . fluticasone (FLONASE) 50 MCG/ACT nasal spray USE 2 SPRAYS IN EACH NOSTRIL DAILY AS NEEDED FOR RHINITIS 48 g 3  . levothyroxine (SYNTHROID, LEVOTHROID) 75 MCG tablet Take 1 tablet (75 mcg total) by mouth daily. 90 tablet 3  . Melatonin 3 MG TABS Take 1 tablet by mouth at bedtime as needed.    . mirtazapine (REMERON) 7.5 MG tablet Take 1 tablet (7.5 mg total) by mouth at bedtime. 30 tablet 5  . potassium chloride SA (K-DUR,KLOR-CON) 20 MEQ tablet Take 1 tablet (20 mEq total) by mouth 2 (two) times daily. 30 tablet 0  . prochlorperazine (COMPAZINE) 10 MG tablet Take 1 tablet (10 mg total) by mouth every 6 (six) hours as needed for nausea or vomiting. 30 tablet 0  . rivaroxaban (XARELTO) 20 MG TABS tablet Take 1 tablet (20 mg total) by mouth daily with supper. 30 tablet 1  . Rivaroxaban 15 & 20 MG TBPK Take as directed on package: Start with one 15mg  tablet by mouth twice a day with food. On Day 22, switch to one 20mg  tablet once a day with food. 51 each 0  . sennosides-docusate sodium (SENOKOT-S) 8.6-50 MG tablet Take 1 tablet by mouth daily as  needed for constipation.    . sucralfate (CARAFATE) 1 g tablet Take 1 tablet (1 g total) by mouth 4 (four) times daily -  with meals and at bedtime. Crush and dissolve in 1 tablespoon of water prior to swallowing 30 tablet 2   No current facility-administered medications for this visit.    Facility-Administered Medications Ordered in Other Visits  Medication Dose Route Frequency Provider Last Rate Last Dose  . acetaminophen (TYLENOL) tablet 650 mg  650 mg Oral Once Nicholas Lose, MD        SURGICAL HISTORY:  Past Surgical History:  Procedure Laterality Date  . BRONCHIAL BIOPSY  11/23/2017     Procedure: BRONCHIAL BIOPSIES;  Surgeon: Grace Isaac, MD;  Location: [facility] OR;  Service: Thoracic;;  . cataract surgery  2007, 2008   repair bilat  . DG BARIUM ENEMA ([facility]) N/A 2016   [name]  . HERNIA REPAIR  04/25/2001  . IR US GUIDE BX ASP/DRAIN  11/17/2017  . KNEE SURGERY     meniscus tear per Dr. Ronnie Derby, R knee  . MULTIPLE TOOTH EXTRACTIONS    . SEPTOPLASTY  2006   Dr. Ernesto Rutherford  . SKIN CANCER EXCISION    . TONSILLECTOMY  1954  . VAGINAL HYSTERECTOMY    . VIDEO BRONCHOSCOPY WITH ENDOBRONCHIAL ULTRASOUND N/A 11/23/2017   Procedure: VIDEO BRONCHOSCOPY WITH ENDOBRONCHIAL ULTRASOUND;  Surgeon: Grace Isaac, MD;  Location: [facility];  Service: Thoracic;  Laterality: N/A;    REVIEW OF SYSTEMS:  A comprehensive review of systems was negative except for: Constitutional: positive for fatigue Respiratory: positive for dyspnea on exertion   PHYSICAL EXAMINATION: General appearance: alert, cooperative, fatigued and no distress Head: Normocephalic, without obvious abnormality, atraumatic Neck: no adenopathy, no JVD, supple, symmetrical, trachea midline and thyroid not enlarged, symmetric, no tenderness/mass/nodules Lymph nodes: Cervical, supraclavicular, and axillary nodes normal. Resp: clear to auscultation bilaterally Back: symmetric, no curvature. ROM normal. No CVA tenderness. Cardio: regular rate and rhythm, S1, S2 normal, no murmur, click, rub or gallop GI: soft, non-tender; bowel sounds normal; no masses,  no organomegaly Extremities: extremities normal, atraumatic, no cyanosis or edema  ECOG PERFORMANCE STATUS: 1 - Symptomatic but completely ambulatory  Blood pressure (!) 145/90, pulse 96, temperature (!) 97.5 F (36.4 C), temperature source Oral, resp. rate 20, height 5\' 5"  (1.651 m), weight 155 lb 12.8 oz (70.7 kg), SpO2 95 %.  LABORATORY DATA: Lab Results  Component Value Date   WBC 5.4 05/11/2018   HGB 12.6 05/11/2018   HCT 40.6 05/11/2018   MCV 91.2  05/11/2018   PLT 179 05/11/2018      Chemistry      Component Value Date/Time   NA 139 04/28/2018 1047   K 3.1 (L) 04/28/2018 1047   CL 102 04/28/2018 1047   CO2 27 04/28/2018 1047   BUN 27 (H) 04/28/2018 1047   CREATININE 1.54 (H) 04/28/2018 1047      Component Value Date/Time   CALCIUM 13.2 (HH) 04/28/2018 1047   ALKPHOS 83 04/28/2018 1047   AST 19 04/28/2018 1047   ALT 8 04/28/2018 1047   BILITOT 0.4 04/28/2018 1047       RADIOGRAPHIC STUDIES: Vas [place] Lower Extremity Venous (dvt)  Result Date: 04/19/2018  Lower Venous Study Indications: Swelling, and Pain. Other Indications: Stage III Squamous cell carcinoma on right lung. Risk Factors: Cancer Tubular adenoma of Colon, Melanoma. Limitations: Body habitus and musculoskeletal features. Comparison Study: Lower extremity venous duplex exam on 11/18/2012. Performing Technologist: Hansel Starling  Cole  Examination Guidelines: A complete evaluation includes B-mode imaging, spectral Doppler, color Doppler, and power Doppler as needed of all accessible portions of each vessel. Bilateral testing is considered an integral part of a complete examination. Limited examinations for reoccurring indications may be performed as noted.  Right Venous Findings: +---------+---------------+---------+-----------+----------+-------------------+          CompressibilityPhasicitySpontaneityPropertiesSummary             +---------+---------------+---------+-----------+----------+-------------------+ CFV      Full                                                             +---------+---------------+---------+-----------+----------+-------------------+ SFJ      Full                                                             +---------+---------------+---------+-----------+----------+-------------------+ FV Prox  Full                                                              +---------+---------------+---------+-----------+----------+-------------------+ FV Mid   Full                                                             +---------+---------------+---------+-----------+----------+-------------------+ FV DistalFull                                                             +---------+---------------+---------+-----------+----------+-------------------+ PFV      Full                                                             +---------+---------------+---------+-----------+----------+-------------------+ POP      Full           Yes      Yes                  poor flow           +---------+---------------+---------+-----------+----------+-------------------+ PTV      Partial                                      Chronic             +---------+---------------+---------+-----------+----------+-------------------+ PERO  not well visualized                                                       with compression    +---------+---------------+---------+-----------+----------+-------------------+  Left Venous Findings: +---------+---------------+---------+-----------+----------+-------+          CompressibilityPhasicitySpontaneityPropertiesSummary +---------+---------------+---------+-----------+----------+-------+ CFV      Full           Yes      Yes                          +---------+---------------+---------+-----------+----------+-------+ SFJ      Full                                                 +---------+---------------+---------+-----------+----------+-------+ FV Prox  Full                                                 +---------+---------------+---------+-----------+----------+-------+ FV Mid   Full                                                 +---------+---------------+---------+-----------+----------+-------+ FV DistalFull                                                  +---------+---------------+---------+-----------+----------+-------+ PFV      Full                                                 +---------+---------------+---------+-----------+----------+-------+ POP      Full           Yes      Yes                          +---------+---------------+---------+-----------+----------+-------+ PTV      Full                                                 +---------+---------------+---------+-----------+----------+-------+ PERO     Partial                                      Chronic +---------+---------------+---------+-----------+----------+-------+ Gastroc  Full                                                 +---------+---------------+---------+-----------+----------+-------+  Summary: Right: Findings consistent with chronic deep vein thrombosis involving the right posterior tibial veins with partially patent. No cystic structure found in the popliteal fossa. Left: Findings consistent with chronic deep vein thrombosis involving the left peroneal veins with partially patent. No cystic structure found in the popliteal fossa.  *See table(s) above for measurements and observations. Electronically signed by Deitra Mayo MD on 04/19/2018 at 1:46:30 PM.    Final     ASSESSMENT AND PLAN: This is a very pleasant 85 years old white female with stage IIIb non-small cell lung cancer, squamous cell carcinoma.  She is currently undergoing a course of concurrent chemoradiation with weekly carboplatin and paclitaxel status post 6 cycles.  She tolerated this treatment well with partial response. She is currently undergoing consolidation treatment with immunotherapy with Imfinzi status post 5 cycles.   She has been tolerating this treatment well with no concerning adverse effects. I recommended for her to proceed with cycle #6 today as scheduled. I will see the patient back for follow-up visit in 2 weeks for  evaluation after repeating CT scan of the chest for restaging of her disease. For the depression and lack of appetite, she will continue her current treatment with Remeron. The patient was advised to call immediately if she has any concerning symptoms in the interval. The patient voices understanding of current disease status and treatment options and is in agreement with the current care plan. All questions were answered. The patient knows to call the clinic with any problems, questions or concerns. We can certainly see the patient much sooner if necessary.  I spent 10 minutes counseling the patient face to face. The total time spent in the appointment was 15 minutes.  Disclaimer: This note was dictated with voice recognition software. Similar sounding words can inadvertently be transcribed and may not be corrected upon review.

## 2018-05-11 NOTE — Patient Instructions (Signed)
[facility] Discharge Instructions for Patients Receiving Chemotherapy  Today you received the following chemotherapy agents :  Durvalumab   To help prevent nausea and vomiting after your treatment, we encourage you to take your nausea medication as prescribed.   If you develop nausea and vomiting that is not controlled by your nausea medication, call the clinic.   BELOW ARE SYMPTOMS THAT SHOULD BE REPORTED IMMEDIATELY:  *FEVER GREATER THAN 100.5 F  *CHILLS WITH OR WITHOUT FEVER  NAUSEA AND VOMITING THAT IS NOT CONTROLLED WITH YOUR NAUSEA MEDICATION  *UNUSUAL SHORTNESS OF BREATH  *UNUSUAL BRUISING OR BLEEDING  TENDERNESS IN MOUTH AND THROAT WITH OR WITHOUT PRESENCE OF ULCERS  *URINARY PROBLEMS  *BOWEL PROBLEMS  UNUSUAL RASH Items with * indicate a potential emergency and should be followed up as soon as possible.  Feel free to call the clinic should you have any questions or concerns. The clinic phone number is [phone].  Please show the [facility] at check-in to the Emergency Department and triage nurse.

## 2018-05-11 NOTE — Telephone Encounter (Signed)
Printed avs and calender of upcoming appointment. Per 2/11 los. Gave information for CT

## 2018-05-11 NOTE — Progress Notes (Signed)
Okay to treat- Per Lisa [name] it is okay to treat pt today with Imfinzi and creatinine from today.

## 2018-05-23 ENCOUNTER — Encounter (HOSPITAL_COMMUNITY): Payer: Self-pay

## 2018-05-23 ENCOUNTER — Ambulatory Visit (HOSPITAL_COMMUNITY)
Admission: RE | Admit: 2018-05-23 | Discharge: 2018-05-23 | Disposition: A | Discharge status: Home / Self Care | Payer: Medicare Other | Source: Ambulatory Visit | Attending: Internal Medicine | Admitting: Internal Medicine

## 2018-05-23 ENCOUNTER — Other Ambulatory Visit: Payer: Self-pay | Admitting: Internal Medicine

## 2018-05-23 DIAGNOSIS — C3490 Malignant neoplasm of unspecified part of unspecified bronchus or lung: Secondary | ICD-10-CM | POA: Insufficient documentation

## 2018-05-23 DIAGNOSIS — C342 Malignant neoplasm of middle lobe, bronchus or lung: Secondary | ICD-10-CM | POA: Diagnosis not present

## 2018-05-24 ENCOUNTER — Other Ambulatory Visit: Payer: Self-pay | Admitting: Medical Oncology

## 2018-05-24 DIAGNOSIS — C3490 Malignant neoplasm of unspecified part of unspecified bronchus or lung: Secondary | ICD-10-CM

## 2018-05-25 ENCOUNTER — Encounter: Payer: Self-pay | Admitting: Physician Assistant

## 2018-05-25 ENCOUNTER — Other Ambulatory Visit: Payer: Medicare Other

## 2018-05-25 ENCOUNTER — Telehealth: Payer: Self-pay | Admitting: Physician Assistant

## 2018-05-25 ENCOUNTER — Inpatient Hospital Stay: Payer: Medicare Other

## 2018-05-25 ENCOUNTER — Inpatient Hospital Stay (HOSPITAL_BASED_OUTPATIENT_CLINIC_OR_DEPARTMENT_OTHER): Payer: Medicare Other | Admitting: Physician Assistant

## 2018-05-25 ENCOUNTER — Ambulatory Visit: Payer: Medicare Other

## 2018-05-25 VITALS — BP 139/84 | HR 86 | Temp 97.8°F | Resp 18 | Ht 65.0 in | Wt 158.1 lb

## 2018-05-25 DIAGNOSIS — Z5112 Encounter for antineoplastic immunotherapy: Secondary | ICD-10-CM | POA: Diagnosis not present

## 2018-05-25 DIAGNOSIS — E039 Hypothyroidism, unspecified: Secondary | ICD-10-CM

## 2018-05-25 DIAGNOSIS — Z85820 Personal history of malignant melanoma of skin: Secondary | ICD-10-CM | POA: Diagnosis not present

## 2018-05-25 DIAGNOSIS — C342 Malignant neoplasm of middle lobe, bronchus or lung: Secondary | ICD-10-CM | POA: Diagnosis not present

## 2018-05-25 DIAGNOSIS — I10 Essential (primary) hypertension: Secondary | ICD-10-CM | POA: Diagnosis not present

## 2018-05-25 DIAGNOSIS — E785 Hyperlipidemia, unspecified: Secondary | ICD-10-CM

## 2018-05-25 DIAGNOSIS — C3490 Malignant neoplasm of unspecified part of unspecified bronchus or lung: Secondary | ICD-10-CM

## 2018-05-25 DIAGNOSIS — Z79899 Other long term (current) drug therapy: Secondary | ICD-10-CM

## 2018-05-25 DIAGNOSIS — C3491 Malignant neoplasm of unspecified part of right bronchus or lung: Secondary | ICD-10-CM

## 2018-05-25 LAB — CBC WITH DIFFERENTIAL (CANCER CENTER ONLY)
Abs Immature Granulocytes: 0.01 10*3/uL (ref 0.00–0.07)
Basophils Absolute: 0 10*3/uL (ref 0.0–0.1)
Basophils Relative: 1 %
Eosinophils Absolute: 0.1 10*3/uL (ref 0.0–0.5)
Eosinophils Relative: 1 %
HCT: 38.1 % (ref 36.0–46.0)
Hemoglobin: 11.7 g/dL — ABNORMAL LOW (ref 12.0–15.0)
Immature Granulocytes: 0 %
Lymphocytes Relative: 21 %
Lymphs Abs: 1.1 10*3/uL (ref 0.7–4.0)
MCH: 28.6 pg (ref 26.0–34.0)
MCHC: 30.7 g/dL (ref 30.0–36.0)
MCV: 93.2 fL (ref 80.0–100.0)
Monocytes Absolute: 0.3 10*3/uL (ref 0.1–1.0)
Monocytes Relative: 6 %
Neutro Abs: 3.8 10*3/uL (ref 1.7–7.7)
Neutrophils Relative %: 71 %
Platelet Count: 140 10*3/uL — ABNORMAL LOW (ref 150–400)
RBC: 4.09 MIL/uL (ref 3.87–5.11)
RDW: 14.4 % (ref 11.5–15.5)
WBC Count: 5.4 10*3/uL (ref 4.0–10.5)
nRBC: 0 % (ref 0.0–0.2)

## 2018-05-25 LAB — CMP (CANCER CENTER ONLY)
ALT: 9 U/L (ref 0–44)
AST: 20 U/L (ref 15–41)
Albumin: 3.4 g/dL — ABNORMAL LOW (ref 3.5–5.0)
Alkaline Phosphatase: 72 U/L (ref 38–126)
Anion gap: 9 (ref 5–15)
BUN: 19 mg/dL (ref 8–23)
CO2: 24 mmol/L (ref 22–32)
Calcium: 9.1 mg/dL (ref 8.9–10.3)
Chloride: 109 mmol/L (ref 98–111)
Creatinine: 1.32 mg/dL — ABNORMAL HIGH (ref 0.44–1.00)
GFR, Est AFR Am: 43 mL/min — ABNORMAL LOW (ref >60)
GFR, Estimated: 37 mL/min — ABNORMAL LOW (ref >60)
Glucose, Bld: 80 mg/dL (ref 70–99)
Potassium: 4.3 mmol/L (ref 3.5–5.1)
Sodium: 142 mmol/L (ref 135–145)
Total Bilirubin: 0.4 mg/dL (ref 0.3–1.2)
Total Protein: 6.8 g/dL (ref 6.5–8.1)

## 2018-05-25 MED ORDER — SODIUM CHLORIDE 0.9 % IV SOLN
Freq: Once | INTRAVENOUS | Status: AC
  Administered 2018-05-25: 14:00:00 via INTRAVENOUS
  Filled 2018-05-25: qty 250

## 2018-05-25 MED ORDER — SODIUM CHLORIDE 0.9 % IV SOLN
10.0000 mg/kg | Freq: Once | INTRAVENOUS | Status: AC
  Administered 2018-05-25: 740 mg via INTRAVENOUS
  Filled 2018-05-25: qty 4.8

## 2018-05-25 NOTE — Progress Notes (Signed)
[facility] OFFICE PROGRESS NOTE  [name], MD [place] [address]  DIAGNOSIS: Stage IIIB (T3, N3, M0) non-small cell lung cancer, squamous cell carcinoma presented with large right middle lobe lung mass in addition to right hilar, subcarinal and right supraclavicular lymphadenopathy diagnosed in August 2019.  PRIOR THERAPY: Concurrent chemoradiation with weekly carboplatin for AUC of 2 and paclitaxel 45 NG/M2.  Status post 6 cycles with partial response.  CURRENT THERAPY: Consolidation immunotherapy with Imfinzi 10 mg/KG every 2 weeks started February 28, 2018.  Status post 5 cycles.  INTERVAL HISTORY: Lisa [name] 85 y.o. female returns to the clinic today for follow-up visit accompanied by her grandson.  The patient is feeling well today except for mild fatigue. She denied having any fever, chills, night sweats, or weight loss. She reports to mild baseline shortness of breath with exertion but denies any chest pain, cough, or hemoptysis. She denied any nausea, vomiting, diarrhea, or constipation. She denies any headache or visual changes.  She denies any rashes or skin changes. She is continuing to tolerate her treatment with Imfinzi fairly well without any adverse side effects. She recently had a restaging CT scan performed is here today for evaluation and to discuss the results.  MEDICAL HISTORY: Past Medical History:  Diagnosis Date  . Diverticulosis   . Headache   . Hemorrhoids    internal and external  . Hyperlipidemia 01/29/1991  . Hypertension 10/29/1995  . Hypothyroidism 10/29/1995  . Lung mass    right middle lobe  . Melanoma ([place])    h/o, local excision. no chemo or rady.   . Skin cancer (melanoma) ([facility])   . Tubular adenoma of colon   . Wears dentures     ALLERGIES:  is allergic to atorvastatin; celebrex [celecoxib]; cholecalciferol; and simvastatin.  MEDICATIONS:  Current Outpatient Medications  Medication Sig Dispense  Refill  . amLODipine (NORVASC) 10 MG tablet Take 1 tablet (10 mg total) by mouth daily. 90 tablet 3  . Cholecalciferol (VITAMIN D) 2000 units tablet Take 2,000 Units by mouth 2 (two) times daily.    . Coenzyme Q10 100 MG capsule Take 100 mg by mouth daily.     Marland Kitchen docusate sodium (COLACE) 100 MG capsule Take 100 mg by mouth daily as needed for mild constipation.    . fluticasone (FLONASE) 50 MCG/ACT nasal spray USE 2 SPRAYS IN EACH NOSTRIL DAILY AS NEEDED FOR RHINITIS (Patient not taking: Reported on 05/11/2018) 48 g 3  . levothyroxine (SYNTHROID, LEVOTHROID) 75 MCG tablet Take 1 tablet (75 mcg total) by mouth daily. 90 tablet 3  . mirtazapine (REMERON) 7.5 MG tablet Take 1 tablet (7.5 mg total) by mouth at bedtime. 30 tablet 5  . potassium chloride SA (K-DUR,KLOR-CON) 20 MEQ tablet Take 1 tablet (20 mEq total) by mouth 2 (two) times daily. 30 tablet 0  . prochlorperazine (COMPAZINE) 10 MG tablet Take 1 tablet (10 mg total) by mouth every 6 (six) hours as needed for nausea or vomiting. (Patient not taking: Reported on 05/11/2018) 30 tablet 0  . rivaroxaban (XARELTO) 20 MG TABS tablet Take 1 tablet (20 mg total) by mouth daily with supper. (Patient not taking: Reported on 05/11/2018) 30 tablet 1  . Rivaroxaban 15 & 20 MG TBPK Take as directed on package: Start with one 15mg  tablet by mouth twice a day with food. On Day 22, switch to one 20mg  tablet once a day with food. 51 each 0  . sennosides-docusate  sodium (SENOKOT-S) 8.6-50 MG tablet Take 1 tablet by mouth daily as needed for constipation.     No current facility-administered medications for this visit.    Facility-Administered Medications Ordered in Other Visits  Medication Dose Route Frequency Provider Last Rate Last Dose  . acetaminophen (TYLENOL) tablet 650 mg  650 mg Oral Once Nicholas Lose, MD        SURGICAL HISTORY:  Past Surgical History:  Procedure Laterality Date  . BRONCHIAL BIOPSY  11/23/2017   Procedure: BRONCHIAL BIOPSIES;   Surgeon: Grace Isaac, MD;  Location: [facility] OR;  Service: Thoracic;;  . cataract surgery  2007, 2008   repair bilat  . DG BARIUM ENEMA ([place] HX) N/A 2016   [name]  . HERNIA REPAIR  04/25/2001  . IR US GUIDE BX ASP/DRAIN  11/17/2017  . KNEE SURGERY     meniscus tear per Dr. Ronnie Derby, R knee  . MULTIPLE TOOTH EXTRACTIONS    . SEPTOPLASTY  2006   Dr. Ernesto Rutherford  . SKIN CANCER EXCISION    . TONSILLECTOMY  1954  . VAGINAL HYSTERECTOMY    . VIDEO BRONCHOSCOPY WITH ENDOBRONCHIAL ULTRASOUND N/A 11/23/2017   Procedure: VIDEO BRONCHOSCOPY WITH ENDOBRONCHIAL ULTRASOUND;  Surgeon: Grace Isaac, MD;  Location: [facility];  Service: Thoracic;  Laterality: N/A;    REVIEW OF SYSTEMS:   Review of Systems  Constitutional: Positive for baseline fatigue. Negative for appetite change, chills, fever and unexpected weight change.  HENT:   Negative for mouth sores, nosebleeds, sore throat and trouble swallowing.   Eyes: Negative for eye problems and icterus.  Respiratory: Positive for baseline shortness of breath. Negative for cough, hemoptysis, and wheezing.   Cardiovascular: Negative for chest pain and leg swelling.  Gastrointestinal: Negative for abdominal pain, constipation, diarrhea, nausea and vomiting.  Genitourinary: Negative for bladder incontinence, difficulty urinating, dysuria, frequency and hematuria.   Musculoskeletal: Negative for back pain, gait problem, neck pain and neck stiffness.  Skin: Negative for itching and rash.  Neurological: Negative for dizziness, extremity weakness, gait problem, headaches, light-headedness and seizures.  Hematological: Negative for adenopathy. Does not bruise/bleed easily.  Psychiatric/Behavioral: Negative for confusion, depression and sleep disturbance. The patient is not nervous/anxious.     PHYSICAL EXAMINATION:  Blood pressure 139/84, pulse 86, temperature 97.8 F (36.6 C), temperature source Oral, resp. rate 18, height 5\' 5"  (1.651 m), weight 158 lb  1.6 oz (71.7 kg), SpO2 97 %.  ECOG PERFORMANCE STATUS: 1 - Symptomatic but completely ambulatory  Physical Exam  Constitutional: Oriented to person, place, and time and well-developed, well-nourished, and in no distress. No distress.  HENT:  Head: Normocephalic and atraumatic.  Mouth/Throat: Oropharynx is clear and moist. No oropharyngeal exudate.  Eyes: Conjunctivae are normal. Right eye exhibits no discharge. Left eye exhibits no discharge. No scleral icterus.  Neck: Normal range of motion. Neck supple.  Cardiovascular: Normal rate, regular rhythm, normal heart sounds and intact distal pulses.   Pulmonary/Chest: Effort normal and breath sounds normal. No respiratory distress. No wheezes. No rales.  Abdominal: Soft. Bowel sounds are normal. Exhibits no distension and no mass. There is no tenderness.  Musculoskeletal: Normal range of motion. Exhibits no edema.  Lymphadenopathy:    No cervical adenopathy.  Neurological: Alert and oriented to person, place, and time. Exhibits normal muscle tone. Gait normal. Coordination normal.  Skin: Skin is warm and dry. No rash noted. Not diaphoretic. No erythema. No pallor.  Psychiatric: Mood, memory and judgment normal.  Vitals reviewed.  LABORATORY DATA: Lab  Results  Component Value Date   WBC 5.4 05/25/2018   HGB 11.7 (L) 05/25/2018   HCT 38.1 05/25/2018   MCV 93.2 05/25/2018   PLT 140 (L) 05/25/2018      Chemistry      Component Value Date/Time   NA 142 05/25/2018 1247   K 4.3 05/25/2018 1247   CL 109 05/25/2018 1247   CO2 24 05/25/2018 1247   BUN 19 05/25/2018 1247   CREATININE 1.32 (H) 05/25/2018 1247      Component Value Date/Time   CALCIUM 9.1 05/25/2018 1247   ALKPHOS 72 05/25/2018 1247   AST 20 05/25/2018 1247   ALT 9 05/25/2018 1247   BILITOT 0.4 05/25/2018 1247       RADIOGRAPHIC STUDIES:  Ct Chest Wo Contrast  Result Date: 05/23/2018 CLINICAL DATA:  Follow-up lung cancer. Non-small cell lung cancer of the right  middle lobe. EXAM: CT CHEST WITHOUT CONTRAST TECHNIQUE: Multidetector CT imaging of the chest was performed following the standard protocol without IV contrast. COMPARISON:  02/14/2018 FINDINGS: Cardiovascular: The heart size is normal. Aortic atherosclerosis. Calcification in the RCA and left main coronary artery noted. Mediastinum/Nodes: Normal appearance of the thyroid gland. The trachea appears patent and is midline. No mediastinal adenopathy. Hilar structures are suboptimally evaluated due to lack of IV contrast material. Lungs/Pleura: There is a new partially moderate loculated right pleural effusion. There is a wide bandlike area of fibrosis and masslike architectural distortion within the right midlung compatible with progressive changes of external beam radiation. The index lesion within the central right middle lobe measures 3.3 x 2.9 by 2.0 cm, image 77/2 and image 56/7. On the previous examination this measured 4.8 x 3.2 by 2.5 cm. There are 2 tiny nonspecific nodules within the left upper lobe which are new and may be postinflammatory, image 37/6 and image 36/6. These measure up to 3 mm. Within the right upper lobe there is a 3 mm nodule, image 56/6. Unchanged. Upper Abdomen: No acute abnormality. Musculoskeletal: Spondylosis within the thoracic spine. No aggressive lytic or sclerotic bone lesions. Schmorl's node deformity and inferior endplate deformity involving T12 vertebra is unchanged. IMPRESSION: 1. Significant interval progression of changes secondary to external beam radiation within the right midlung. There is been interval decrease in size of index lesion within the central right middle lobe. No specific findings identified to suggest progression of disease. 2. New moderate loculated right pleural effusion. 3. Aortic atherosclerosis and coronary artery atherosclerotic calcifications Electronically Signed   By: Kerby Moors M.D.   On: 05/23/2018 09:24     ASSESSMENT/PLAN:  This is a very  pleasant 85 year old Caucasian female with stage IIIb non-small cell lung cancer, squamous cell carcinoma.  She had completed a course of concurrent chemoradiation with carboplatin and paclitaxel and is status post 6 cycles with a partial response.  She is currently undergoing consolidation immunotherapy with Imfinzi.  She is status post 6 cycles.   The patient was seen with Dr. Julien Nordmann today.  She has been tolerating treatment well without any concerning adverse effects. Repeat CT scan was performed. Dr. Earlie Server personally and independently reviewed the results.  The scan did not show any evidence of disease progression and demonstrated an interval decrease in the size of the lesion with in the right middle lobe. The scan also demonstrated a new loculated pleural effusion of the right lobe. The patient is asymptomatic. We will continue to monitor. No intervention needed at this time.   The patient will proceed with cycle #  7 today as scheduled.  I will see her back for follow-up visit in 2 weeks for evaluation prior to starting cycle #8.   The patient was advised to call immediately if she has any concerning symptoms in the interval. The patient voices understanding of current disease status and treatment options and is in agreement with the current care plan. All questions were answered. The patient knows to call the clinic with any problems, questions or concerns. We can certainly see the patient much sooner if necessary  Orders Placed This Encounter  Procedures  . TSH    Standing Status:   Standing    Number of Occurrences:   7    Standing Expiration Date:   05/26/2019  . CBC with Differential (Cancer Center Only)    Standing Status:   Standing    Number of Occurrences:   15    Standing Expiration Date:   05/26/2019  . CMP ([facility] only)    Standing Status:   Standing    Number of Occurrences:   15    Standing Expiration Date:   05/26/2019     Tobe Sos Euretha Najarro,  PA-C 05/25/18  ADDENDUM: Hematology/Oncology Attending: I had a face-to-face encounter with the patient today.  I recommended her care plan.  This is a very pleasant 85 years old white female with a stage IIIb non-small cell lung cancer, squamous cell carcinoma status post a course of concurrent chemoradiation with weekly carboplatin and paclitaxel and she is currently undergoing consolidation treatment with immunotherapy with Imfinzi status post 6 cycles.  She has been tolerating this treatment well with no concerning adverse effects. The patient had repeat CT scan of the chest performed recently.  I personally and independently reviewed the scan images and discussed the results with the patient and her grandson. Her scan showed no concerning findings for disease progression and there was decrease in the central right middle lobe mass but there was progression of the radiation changes and small right pleural effusion. I recommended for the patient to continue her current treatment with Imfinzi and she will proceed with cycle #7 today.  She was advised to call immediately if she has any worsening dyspnea and if it is related to the enlarging pleural effusion, we will consider ultrasound guided thoracentesis of the enlarging right pleural effusion. The patient will come back for follow-up visit in 2 weeks for evaluation before the next cycle of her treatment. She was advised to call immediately if she has any concerning symptoms in the interval.  Disclaimer: This note was dictated with voice recognition software. Similar sounding words can inadvertently be transcribed and may be missed upon review. Eilleen Kempf, MD 05/25/18

## 2018-05-25 NOTE — Patient Instructions (Signed)
[facility] Discharge Instructions for Patients Receiving Chemotherapy  Today you received the following chemotherapy agents :  Durvalumab   To help prevent nausea and vomiting after your treatment, we encourage you to take your nausea medication as prescribed.   If you develop nausea and vomiting that is not controlled by your nausea medication, call the clinic.   BELOW ARE SYMPTOMS THAT SHOULD BE REPORTED IMMEDIATELY:  *FEVER GREATER THAN 100.5 F  *CHILLS WITH OR WITHOUT FEVER  NAUSEA AND VOMITING THAT IS NOT CONTROLLED WITH YOUR NAUSEA MEDICATION  *UNUSUAL SHORTNESS OF BREATH  *UNUSUAL BRUISING OR BLEEDING  TENDERNESS IN MOUTH AND THROAT WITH OR WITHOUT PRESENCE OF ULCERS  *URINARY PROBLEMS  *BOWEL PROBLEMS  UNUSUAL RASH Items with * indicate a potential emergency and should be followed up as soon as possible.  Feel free to call the clinic should you have any questions or concerns. The clinic phone number is [phone].  Please show the [facility] at check-in to the Emergency Department and triage nurse.

## 2018-05-25 NOTE — Telephone Encounter (Signed)
Scheduled appt per 02/26 los. ° °Printed calendar and avs. °

## 2018-06-09 ENCOUNTER — Inpatient Hospital Stay: Payer: Medicare Other | Attending: Internal Medicine | Admitting: Internal Medicine

## 2018-06-09 ENCOUNTER — Inpatient Hospital Stay: Payer: Medicare Other

## 2018-06-09 ENCOUNTER — Other Ambulatory Visit: Payer: Self-pay

## 2018-06-09 ENCOUNTER — Telehealth: Payer: Self-pay | Admitting: Internal Medicine

## 2018-06-09 VITALS — BP 136/87 | HR 93 | Temp 98.2°F | Resp 20 | Ht 65.0 in | Wt 154.9 lb

## 2018-06-09 DIAGNOSIS — Z5112 Encounter for antineoplastic immunotherapy: Secondary | ICD-10-CM

## 2018-06-09 DIAGNOSIS — E785 Hyperlipidemia, unspecified: Secondary | ICD-10-CM | POA: Diagnosis not present

## 2018-06-09 DIAGNOSIS — Z923 Personal history of irradiation: Secondary | ICD-10-CM | POA: Insufficient documentation

## 2018-06-09 DIAGNOSIS — R0609 Other forms of dyspnea: Secondary | ICD-10-CM | POA: Diagnosis not present

## 2018-06-09 DIAGNOSIS — Z85820 Personal history of malignant melanoma of skin: Secondary | ICD-10-CM | POA: Insufficient documentation

## 2018-06-09 DIAGNOSIS — F329 Major depressive disorder, single episode, unspecified: Secondary | ICD-10-CM | POA: Diagnosis not present

## 2018-06-09 DIAGNOSIS — Z79899 Other long term (current) drug therapy: Secondary | ICD-10-CM | POA: Insufficient documentation

## 2018-06-09 DIAGNOSIS — Z7901 Long term (current) use of anticoagulants: Secondary | ICD-10-CM | POA: Insufficient documentation

## 2018-06-09 DIAGNOSIS — I10 Essential (primary) hypertension: Secondary | ICD-10-CM | POA: Diagnosis not present

## 2018-06-09 DIAGNOSIS — Z9221 Personal history of antineoplastic chemotherapy: Secondary | ICD-10-CM | POA: Diagnosis not present

## 2018-06-09 DIAGNOSIS — R630 Anorexia: Secondary | ICD-10-CM | POA: Insufficient documentation

## 2018-06-09 DIAGNOSIS — C342 Malignant neoplasm of middle lobe, bronchus or lung: Secondary | ICD-10-CM | POA: Diagnosis not present

## 2018-06-09 DIAGNOSIS — I700 Atherosclerosis of aorta: Secondary | ICD-10-CM

## 2018-06-09 DIAGNOSIS — E039 Hypothyroidism, unspecified: Secondary | ICD-10-CM | POA: Diagnosis not present

## 2018-06-09 DIAGNOSIS — R5383 Other fatigue: Secondary | ICD-10-CM | POA: Diagnosis not present

## 2018-06-09 DIAGNOSIS — C3491 Malignant neoplasm of unspecified part of right bronchus or lung: Secondary | ICD-10-CM

## 2018-06-09 LAB — CBC WITH DIFFERENTIAL (CANCER CENTER ONLY)
Abs Immature Granulocytes: 0.01 10*3/uL (ref 0.00–0.07)
Basophils Absolute: 0 10*3/uL (ref 0.0–0.1)
Basophils Relative: 1 %
Eosinophils Absolute: 0.1 10*3/uL (ref 0.0–0.5)
Eosinophils Relative: 2 %
HCT: 42.8 % (ref 36.0–46.0)
Hemoglobin: 13 g/dL (ref 12.0–15.0)
Immature Granulocytes: 0 %
Lymphocytes Relative: 20 %
Lymphs Abs: 0.9 10*3/uL (ref 0.7–4.0)
MCH: 28.5 pg (ref 26.0–34.0)
MCHC: 30.4 g/dL (ref 30.0–36.0)
MCV: 93.9 fL (ref 80.0–100.0)
Monocytes Absolute: 0.4 10*3/uL (ref 0.1–1.0)
Monocytes Relative: 8 %
Neutro Abs: 3.1 10*3/uL (ref 1.7–7.7)
Neutrophils Relative %: 69 %
Platelet Count: 130 10*3/uL — ABNORMAL LOW (ref 150–400)
RBC: 4.56 MIL/uL (ref 3.87–5.11)
RDW: 15 % (ref 11.5–15.5)
WBC Count: 4.6 10*3/uL (ref 4.0–10.5)
nRBC: 0 % (ref 0.0–0.2)

## 2018-06-09 LAB — CMP (CANCER CENTER ONLY)
ALT: 10 U/L (ref 0–44)
AST: 20 U/L (ref 15–41)
Albumin: 3.4 g/dL — ABNORMAL LOW (ref 3.5–5.0)
Alkaline Phosphatase: 65 U/L (ref 38–126)
Anion gap: 11 (ref 5–15)
BUN: 23 mg/dL (ref 8–23)
CO2: 24 mmol/L (ref 22–32)
Calcium: 9.5 mg/dL (ref 8.9–10.3)
Chloride: 109 mmol/L (ref 98–111)
Creatinine: 1.43 mg/dL — ABNORMAL HIGH (ref 0.44–1.00)
GFR, Est AFR Am: 39 mL/min — ABNORMAL LOW (ref >60)
GFR, Estimated: 34 mL/min — ABNORMAL LOW (ref >60)
Glucose, Bld: 85 mg/dL (ref 70–99)
Potassium: 3.8 mmol/L (ref 3.5–5.1)
Sodium: 144 mmol/L (ref 135–145)
Total Bilirubin: 0.5 mg/dL (ref 0.3–1.2)
Total Protein: 6.7 g/dL (ref 6.5–8.1)

## 2018-06-09 LAB — TSH: TSH: 1.454 u[IU]/mL (ref 0.308–3.960)

## 2018-06-09 MED ORDER — SODIUM CHLORIDE 0.9 % IV SOLN
10.0000 mg/kg | Freq: Once | INTRAVENOUS | Status: AC
  Administered 2018-06-09: 740 mg via INTRAVENOUS
  Filled 2018-06-09: qty 4.8

## 2018-06-09 MED ORDER — SODIUM CHLORIDE 0.9 % IV SOLN
Freq: Once | INTRAVENOUS | Status: AC
  Administered 2018-06-09: 13:00:00 via INTRAVENOUS
  Filled 2018-06-09: qty 250

## 2018-06-09 NOTE — Progress Notes (Signed)
[facility] [phone]   [phone]  OFFICE PROGRESS NOTE  [name], MD [place] [address]  DIAGNOSIS: Stage IIIB (T3, N3, M0) non-small cell lung cancer, squamous cell carcinoma presented with large right middle lobe lung mass in addition to right hilar, subcarinal and right supraclavicular lymphadenopathy diagnosed in August 2019.  PRIOR THERAPY: Concurrent chemoradiation with weekly carboplatin for AUC of 2 and paclitaxel 45 NG/M2.  Status post 6 cycles with partial response.   CURRENT THERAPY: Consolidation immunotherapy with Imfinzi 10 mg/KG every 2 weeks started February 28, 2018.  Status post 7 cycles.  INTERVAL HISTORY: Lisa [name] 85 y.o. female returns to the clinic today for follow-up visit accompanied by her grandson.  The patient is feeling fine today with no concerning complaint except for mild fatigue.  She continues to tolerate her treatment with Imfinzi fairly well.  She denied having any current chest pain, shortness of breath, cough or hemoptysis.  She denied having any fever or chills.  She has no nausea, vomiting, diarrhea or constipation.  She has no headache or visual changes.  She is here today for evaluation before starting cycle #8.Marland Kitchen   MEDICAL HISTORY: Past Medical History:  Diagnosis Date   Diverticulosis    Headache    Hemorrhoids    internal and external   Hyperlipidemia 01/29/1991   Hypertension 10/29/1995   Hypothyroidism 10/29/1995   Lung mass    right middle lobe   Melanoma ([place])    h/o, local excision. no chemo or rady.    Skin cancer (melanoma) ([place])    Tubular adenoma of colon    Wears dentures     ALLERGIES:  is allergic to atorvastatin; celebrex [celecoxib]; cholecalciferol; and simvastatin.  MEDICATIONS:  Current Outpatient Medications  Medication Sig Dispense Refill   amLODipine (NORVASC) 10 MG tablet Take 1 tablet (10 mg total) by mouth daily. 90  tablet 3   Cholecalciferol (VITAMIN D) 2000 units tablet Take 2,000 Units by mouth 2 (two) times daily.     Coenzyme Q10 100 MG capsule Take 100 mg by mouth daily.      docusate sodium (COLACE) 100 MG capsule Take 100 mg by mouth daily as needed for mild constipation.     fluticasone (FLONASE) 50 MCG/ACT nasal spray USE 2 SPRAYS IN EACH NOSTRIL DAILY AS NEEDED FOR RHINITIS (Patient not taking: Reported on 05/11/2018) 48 g 3   levothyroxine (SYNTHROID, LEVOTHROID) 75 MCG tablet Take 1 tablet (75 mcg total) by mouth daily. 90 tablet 3   mirtazapine (REMERON) 7.5 MG tablet Take 1 tablet (7.5 mg total) by mouth at bedtime. 30 tablet 5   potassium chloride SA (K-DUR,KLOR-CON) 20 MEQ tablet Take 1 tablet (20 mEq total) by mouth 2 (two) times daily. 30 tablet 0   prochlorperazine (COMPAZINE) 10 MG tablet Take 1 tablet (10 mg total) by mouth every 6 (six) hours as needed for nausea or vomiting. (Patient not taking: Reported on 05/11/2018) 30 tablet 0   rivaroxaban (XARELTO) 20 MG TABS tablet Take 1 tablet (20 mg total) by mouth daily with supper. (Patient not taking: Reported on 05/11/2018) 30 tablet 1   Rivaroxaban 15 & 20 MG TBPK Take as directed on package: Start with one 15mg  tablet by mouth twice a day with food. On Day 22, switch to one 20mg  tablet once a day with food. 51 each 0   sennosides-docusate sodium (SENOKOT-S) 8.6-50 MG tablet Take 1 tablet  by mouth daily as needed for constipation.     No current facility-administered medications for this visit.    Facility-Administered Medications Ordered in Other Visits  Medication Dose Route Frequency Provider Last Rate Last Dose   acetaminophen (TYLENOL) tablet 650 mg  650 mg Oral Once Nicholas Lose, MD        SURGICAL HISTORY:  Past Surgical History:  Procedure Laterality Date   BRONCHIAL BIOPSY  11/23/2017   Procedure: BRONCHIAL BIOPSIES;  Surgeon: Grace Isaac, MD;  Location: [facility] OR;  Service: Thoracic;;   cataract surgery   2007, 2008   repair bilat   DG BARIUM ENEMA ([place] HX) N/A 2016   [name]   HERNIA REPAIR  04/25/2001   IR US GUIDE BX ASP/DRAIN  11/17/2017   KNEE SURGERY     meniscus tear per Dr. Ronnie Derby, R knee   MULTIPLE TOOTH EXTRACTIONS     SEPTOPLASTY  2006   Dr. Ernesto Rutherford   SKIN CANCER EXCISION     TONSILLECTOMY  1954   VAGINAL HYSTERECTOMY     VIDEO BRONCHOSCOPY WITH ENDOBRONCHIAL ULTRASOUND N/A 11/23/2017   Procedure: VIDEO BRONCHOSCOPY WITH ENDOBRONCHIAL ULTRASOUND;  Surgeon: Grace Isaac, MD;  Location: [facility];  Service: Thoracic;  Laterality: N/A;    REVIEW OF SYSTEMS:  A comprehensive review of systems was negative except for: Constitutional: positive for fatigue   PHYSICAL EXAMINATION: General appearance: alert, cooperative, fatigued and no distress Head: Normocephalic, without obvious abnormality, atraumatic Neck: no adenopathy, no JVD, supple, symmetrical, trachea midline and thyroid not enlarged, symmetric, no tenderness/mass/nodules Lymph nodes: Cervical, supraclavicular, and axillary nodes normal. Resp: clear to auscultation bilaterally Back: symmetric, no curvature. ROM normal. No CVA tenderness. Cardio: regular rate and rhythm, S1, S2 normal, no murmur, click, rub or gallop GI: soft, non-tender; bowel sounds normal; no masses,  no organomegaly Extremities: extremities normal, atraumatic, no cyanosis or edema  ECOG PERFORMANCE STATUS: 1 - Symptomatic but completely ambulatory  Blood pressure 136/87, pulse 93, temperature 98.2 F (36.8 C), temperature source Oral, resp. rate 20, height 5\' 5"  (1.651 m), weight 154 lb 14.4 oz (70.3 kg), SpO2 97 %.  LABORATORY DATA: Lab Results  Component Value Date   WBC 4.6 06/09/2018   HGB 13.0 06/09/2018   HCT 42.8 06/09/2018   MCV 93.9 06/09/2018   PLT 130 (L) 06/09/2018      Chemistry      Component Value Date/Time   NA 144 06/09/2018 1137   K 3.8 06/09/2018 1137   CL 109 06/09/2018 1137   CO2 24 06/09/2018 1137     BUN 23 06/09/2018 1137   CREATININE 1.43 (H) 06/09/2018 1137      Component Value Date/Time   CALCIUM 9.5 06/09/2018 1137   ALKPHOS 65 06/09/2018 1137   AST 20 06/09/2018 1137   ALT 10 06/09/2018 1137   BILITOT 0.5 06/09/2018 1137       RADIOGRAPHIC STUDIES: Ct Chest Wo Contrast  Result Date: 05/23/2018 CLINICAL DATA:  Follow-up lung cancer. Non-small cell lung cancer of the right middle lobe. EXAM: CT CHEST WITHOUT CONTRAST TECHNIQUE: Multidetector CT imaging of the chest was performed following the standard protocol without IV contrast. COMPARISON:  02/14/2018 FINDINGS: Cardiovascular: The heart size is normal. Aortic atherosclerosis. Calcification in the RCA and left main coronary artery noted. Mediastinum/Nodes: Normal appearance of the thyroid gland. The trachea appears patent and is midline. No mediastinal adenopathy. Hilar structures are suboptimally evaluated due to lack of IV contrast material. Lungs/Pleura: There is a new  partially moderate loculated right pleural effusion. There is a wide bandlike area of fibrosis and masslike architectural distortion within the right midlung compatible with progressive changes of external beam radiation. The index lesion within the central right middle lobe measures 3.3 x 2.9 by 2.0 cm, image 77/2 and image 56/7. On the previous examination this measured 4.8 x 3.2 by 2.5 cm. There are 2 tiny nonspecific nodules within the left upper lobe which are new and may be postinflammatory, image 37/6 and image 36/6. These measure up to 3 mm. Within the right upper lobe there is a 3 mm nodule, image 56/6. Unchanged. Upper Abdomen: No acute abnormality. Musculoskeletal: Spondylosis within the thoracic spine. No aggressive lytic or sclerotic bone lesions. Schmorl's node deformity and inferior endplate deformity involving T12 vertebra is unchanged. IMPRESSION: 1. Significant interval progression of changes secondary to external beam radiation within the right  midlung. There is been interval decrease in size of index lesion within the central right middle lobe. No specific findings identified to suggest progression of disease. 2. New moderate loculated right pleural effusion. 3. Aortic atherosclerosis and coronary artery atherosclerotic calcifications Electronically Signed   By: Kerby Moors M.D.   On: 05/23/2018 09:24    ASSESSMENT AND PLAN: This is a very pleasant 85 years old white female with stage IIIb non-small cell lung cancer, squamous cell carcinoma.  She is currently undergoing a course of concurrent chemoradiation with weekly carboplatin and paclitaxel status post 6 cycles.  She tolerated this treatment well with partial response. She is currently undergoing consolidation treatment with immunotherapy with Imfinzi status post 7 cycles.   She is tolerating this treatment well with no concerning adverse effects. I recommended for the patient to proceed with cycle #8 today as scheduled. For the depression and lack of appetite, she will continue her current treatment with Remeron. I will see her back for follow-up visit in 2 weeks for evaluation before starting cycle #9. She was advised to call immediately if she has any concerning symptoms in the interval. The patient voices understanding of current disease status and treatment options and is in agreement with the current care plan. All questions were answered. The patient knows to call the clinic with any problems, questions or concerns. We can certainly see the patient much sooner if necessary.  I spent 10 minutes counseling the patient face to face. The total time spent in the appointment was 15 minutes.  Disclaimer: This note was dictated with voice recognition software. Similar sounding words can inadvertently be transcribed and may not be corrected upon review.

## 2018-06-09 NOTE — Patient Instructions (Signed)
[facility] Discharge Instructions for Patients Receiving Chemotherapy  Today you received the following chemotherapy agents: Imfinzi.  To help prevent nausea and vomiting after your treatment, we encourage you to take your nausea medication as directed.   If you develop nausea and vomiting that is not controlled by your nausea medication, call the clinic.   BELOW ARE SYMPTOMS THAT SHOULD BE REPORTED IMMEDIATELY:  *FEVER GREATER THAN 100.5 F  *CHILLS WITH OR WITHOUT FEVER  NAUSEA AND VOMITING THAT IS NOT CONTROLLED WITH YOUR NAUSEA MEDICATION  *UNUSUAL SHORTNESS OF BREATH  *UNUSUAL BRUISING OR BLEEDING  TENDERNESS IN MOUTH AND THROAT WITH OR WITHOUT PRESENCE OF ULCERS  *URINARY PROBLEMS  *BOWEL PROBLEMS  UNUSUAL RASH Items with * indicate a potential emergency and should be followed up as soon as possible.  Feel free to call the clinic should you have any questions or concerns. The clinic phone number is [phone].  Please show the CHEMO ALERT CARD at check-in to the Emergency Department and triage nurse.   

## 2018-06-09 NOTE — Telephone Encounter (Signed)
Scheduled appt per 3/12 los.  Printed calendar and avs.

## 2018-06-10 ENCOUNTER — Encounter: Payer: Self-pay | Admitting: Internal Medicine

## 2018-06-10 ENCOUNTER — Other Ambulatory Visit: Payer: Self-pay | Admitting: Oncology

## 2018-06-13 ENCOUNTER — Other Ambulatory Visit: Payer: Self-pay | Admitting: Oncology

## 2018-06-14 ENCOUNTER — Other Ambulatory Visit: Payer: Self-pay | Admitting: Oncology

## 2018-06-22 ENCOUNTER — Other Ambulatory Visit: Payer: Self-pay | Admitting: Medical Oncology

## 2018-06-22 ENCOUNTER — Inpatient Hospital Stay (HOSPITAL_BASED_OUTPATIENT_CLINIC_OR_DEPARTMENT_OTHER): Payer: Medicare Other | Admitting: Internal Medicine

## 2018-06-22 ENCOUNTER — Other Ambulatory Visit: Payer: Self-pay

## 2018-06-22 ENCOUNTER — Encounter: Payer: Self-pay | Admitting: Internal Medicine

## 2018-06-22 ENCOUNTER — Inpatient Hospital Stay: Payer: Medicare Other

## 2018-06-22 VITALS — BP 119/73 | HR 91 | Temp 97.6°F | Resp 17 | Ht 65.0 in | Wt 155.8 lb

## 2018-06-22 DIAGNOSIS — I700 Atherosclerosis of aorta: Secondary | ICD-10-CM

## 2018-06-22 DIAGNOSIS — Z7901 Long term (current) use of anticoagulants: Secondary | ICD-10-CM

## 2018-06-22 DIAGNOSIS — Z9221 Personal history of antineoplastic chemotherapy: Secondary | ICD-10-CM

## 2018-06-22 DIAGNOSIS — Z79899 Other long term (current) drug therapy: Secondary | ICD-10-CM

## 2018-06-22 DIAGNOSIS — R630 Anorexia: Secondary | ICD-10-CM | POA: Diagnosis not present

## 2018-06-22 DIAGNOSIS — C342 Malignant neoplasm of middle lobe, bronchus or lung: Secondary | ICD-10-CM

## 2018-06-22 DIAGNOSIS — E785 Hyperlipidemia, unspecified: Secondary | ICD-10-CM | POA: Diagnosis not present

## 2018-06-22 DIAGNOSIS — Z85820 Personal history of malignant melanoma of skin: Secondary | ICD-10-CM

## 2018-06-22 DIAGNOSIS — R5383 Other fatigue: Secondary | ICD-10-CM | POA: Diagnosis not present

## 2018-06-22 DIAGNOSIS — R0609 Other forms of dyspnea: Secondary | ICD-10-CM

## 2018-06-22 DIAGNOSIS — E039 Hypothyroidism, unspecified: Secondary | ICD-10-CM | POA: Diagnosis not present

## 2018-06-22 DIAGNOSIS — C3491 Malignant neoplasm of unspecified part of right bronchus or lung: Secondary | ICD-10-CM

## 2018-06-22 DIAGNOSIS — Z923 Personal history of irradiation: Secondary | ICD-10-CM | POA: Diagnosis not present

## 2018-06-22 DIAGNOSIS — Z95828 Presence of other vascular implants and grafts: Secondary | ICD-10-CM

## 2018-06-22 DIAGNOSIS — Z5112 Encounter for antineoplastic immunotherapy: Secondary | ICD-10-CM

## 2018-06-22 DIAGNOSIS — I10 Essential (primary) hypertension: Secondary | ICD-10-CM | POA: Diagnosis not present

## 2018-06-22 DIAGNOSIS — F329 Major depressive disorder, single episode, unspecified: Secondary | ICD-10-CM | POA: Diagnosis not present

## 2018-06-22 DIAGNOSIS — I878 Other specified disorders of veins: Secondary | ICD-10-CM

## 2018-06-22 LAB — CBC WITH DIFFERENTIAL (CANCER CENTER ONLY)
Abs Immature Granulocytes: 0.01 10*3/uL (ref 0.00–0.07)
Basophils Absolute: 0 10*3/uL (ref 0.0–0.1)
Basophils Relative: 1 %
Eosinophils Absolute: 0.1 10*3/uL (ref 0.0–0.5)
Eosinophils Relative: 2 %
HCT: 40.6 % (ref 36.0–46.0)
Hemoglobin: 12.7 g/dL (ref 12.0–15.0)
Immature Granulocytes: 0 %
Lymphocytes Relative: 20 %
Lymphs Abs: 0.9 10*3/uL (ref 0.7–4.0)
MCH: 28.6 pg (ref 26.0–34.0)
MCHC: 31.3 g/dL (ref 30.0–36.0)
MCV: 91.4 fL (ref 80.0–100.0)
Monocytes Absolute: 0.3 10*3/uL (ref 0.1–1.0)
Monocytes Relative: 8 %
Neutro Abs: 3.1 10*3/uL (ref 1.7–7.7)
Neutrophils Relative %: 69 %
Platelet Count: 134 10*3/uL — ABNORMAL LOW (ref 150–400)
RBC: 4.44 MIL/uL (ref 3.87–5.11)
RDW: 14.9 % (ref 11.5–15.5)
WBC Count: 4.4 10*3/uL (ref 4.0–10.5)
nRBC: 0 % (ref 0.0–0.2)

## 2018-06-22 LAB — CMP (CANCER CENTER ONLY)
ALT: 16 U/L (ref 0–44)
AST: 22 U/L (ref 15–41)
Albumin: 3.4 g/dL — ABNORMAL LOW (ref 3.5–5.0)
Alkaline Phosphatase: 68 U/L (ref 38–126)
Anion gap: 9 (ref 5–15)
BUN: 29 mg/dL — ABNORMAL HIGH (ref 8–23)
CO2: 26 mmol/L (ref 22–32)
Calcium: 9.5 mg/dL (ref 8.9–10.3)
Chloride: 110 mmol/L (ref 98–111)
Creatinine: 1.4 mg/dL — ABNORMAL HIGH (ref 0.44–1.00)
GFR, Est AFR Am: 40 mL/min — ABNORMAL LOW (ref >60)
GFR, Estimated: 35 mL/min — ABNORMAL LOW (ref >60)
Glucose, Bld: 97 mg/dL (ref 70–99)
Potassium: 3.8 mmol/L (ref 3.5–5.1)
Sodium: 145 mmol/L (ref 135–145)
Total Bilirubin: 0.5 mg/dL (ref 0.3–1.2)
Total Protein: 6.6 g/dL (ref 6.5–8.1)

## 2018-06-22 MED ORDER — SODIUM CHLORIDE 0.9 % IV SOLN
10.0000 mg/kg | Freq: Once | INTRAVENOUS | Status: AC
  Administered 2018-06-22: 740 mg via INTRAVENOUS
  Filled 2018-06-22: qty 10

## 2018-06-22 MED ORDER — SODIUM CHLORIDE 0.9% FLUSH
10.0000 mL | INTRAVENOUS | Status: DC | PRN
  Filled 2018-06-22: qty 10

## 2018-06-22 MED ORDER — LIDOCAINE-PRILOCAINE 2.5-2.5 % EX CREA: 1.0000 "application " | TOPICAL_CREAM | CUTANEOUS | 0 refills | Status: DC | PRN

## 2018-06-22 MED ORDER — SODIUM CHLORIDE 0.9 % IV SOLN
Freq: Once | INTRAVENOUS | Status: AC
  Administered 2018-06-22: 11:00:00 via INTRAVENOUS
  Filled 2018-06-22: qty 250

## 2018-06-22 NOTE — Patient Instructions (Signed)
Implanted Port Home Guide An implanted port is a device that is placed under the skin. It is usually placed in the chest. The device can be used to give IV medicine, to take blood, or for dialysis. You may have an implanted port if:  You need IV medicine that would be irritating to the small veins in your hands or arms.  You need IV medicines, such as antibiotics, for a long period of time.  You need IV nutrition for a long period of time.  You need dialysis. Having a port means that your health care provider will not need to use the veins in your arms for these procedures. You may have fewer limitations when using a port than you would if you used other types of long-term IVs, and you will likely be able to return to normal activities after your incision heals. An implanted port has two main parts:  Reservoir. The reservoir is the part where a needle is inserted to give medicines or draw blood. The reservoir is round. After it is placed, it appears as a small, raised area under your skin.  Catheter. The catheter is a thin, flexible tube that connects the reservoir to a vein. Medicine that is inserted into the reservoir goes into the catheter and then into the vein. How is my port accessed? To access your port:  A numbing cream may be placed on the skin over the port site.  Your health care provider will put on a mask and sterile gloves.  The skin over your port will be cleaned carefully with a germ-killing soap and allowed to dry.  Your health care provider will gently pinch the port and insert a needle into it.  Your health care provider will check for a blood return to make sure the port is in the vein and is not clogged.  If your port needs to remain accessed to get medicine continuously (constant infusion), your health care provider will place a clear bandage (dressing) over the needle site. The dressing and needle will need to be changed every week, or as told by your health care  provider. What is flushing? Flushing helps keep the port from getting clogged. Follow instructions from your health care provider about how and when to flush the port. Ports are usually flushed with saline solution or a medicine called heparin. The need for flushing will depend on how the port is used:  If the port is only used from time to time to give medicines or draw blood, the port may need to be flushed: ? Before and after medicines have been given. ? Before and after blood has been drawn. ? As part of routine maintenance. Flushing may be recommended every 4-6 weeks.  If a constant infusion is running, the port may not need to be flushed.  Throw away any syringes in a disposal container that is meant for sharp items (sharps container). You can buy a sharps container from a pharmacy, or you can make one by using an empty hard plastic bottle with a cover. How long will my port stay implanted? The port can stay in for as long as your health care provider thinks it is needed. When it is time for the port to come out, a surgery will be done to remove it. The surgery will be similar to the procedure that was done to put the port in. Follow these instructions at home:   Flush your port as told by your health care provider.    If you need an infusion over several days, follow instructions from your health care provider about how to take care of your port site. Make sure you: ? Wash your hands with soap and water before you change your dressing. If soap and water are not available, use alcohol-based hand sanitizer. ? Change your dressing as told by your health care provider. ? Place any used dressings or infusion bags into a plastic bag. Throw that bag in the trash. ? Keep the dressing that covers the needle clean and dry. Do not get it wet. ? Do not use scissors or sharp objects near the tube. ? Keep the tube clamped, unless it is being used.  Check your port site every day for signs of  infection. Check for: ? Redness, swelling, or pain. ? Fluid or blood. ? Pus or a bad smell.  Protect the skin around the port site. ? Avoid wearing bra straps that rub or irritate the site. ? Protect the skin around your port from seat belts. Place a soft pad over your chest if needed.  Bathe or shower as told by your health care provider. The site may get wet as long as you are not actively receiving an infusion.  Return to your normal activities as told by your health care provider. Ask your health care provider what activities are safe for you.  Carry a medical alert card or wear a medical alert bracelet at all times. This will let health care providers know that you have an implanted port in case of an emergency. Get help right away if:  You have redness, swelling, or pain at the port site.  You have fluid or blood coming from your port site.  You have pus or a bad smell coming from the port site.  You have a fever. Summary  Implanted ports are usually placed in the chest for long-term IV access.  Follow instructions from your health care provider about flushing the port and changing bandages (dressings).  Take care of the area around your port by avoiding clothing that puts pressure on the area, and by watching for signs of infection.  Protect the skin around your port from seat belts. Place a soft pad over your chest if needed.  Get help right away if you have a fever or you have redness, swelling, pain, drainage, or a bad smell at the port site. This information is not intended to replace advice given to you by your health care provider. Make sure you discuss any questions you have with your health care provider. Document Released: 03/16/2005 Document Revised: 04/18/2016 Document Reviewed: 04/18/2016 Elsevier Interactive Patient Education  2019 [place] (COVID-19) Are you at risk?  Are you at risk for the Coronavirus (COVID-19)?  To be considered  HIGH RISK for Coronavirus (COVID-19), you have to meet the following criteria:  . Traveled to [place], [place], [place], [place] or [place]; or in the [place] to [place], [place], [place], or [place]; and have fever, cough, and shortness of breath within the last 2 weeks of travel OR . Been in close contact with a person diagnosed with COVID-19 within the last 2 weeks and have fever, cough, and shortness of breath . IF YOU DO NOT MEET THESE CRITERIA, YOU ARE CONSIDERED LOW RISK FOR COVID-19.  What to do if you are HIGH RISK for COVID-19?  Marland Kitchen If you are having a medical emergency, call 911. . Seek medical care right away. Before  you go to a doctor's office, urgent care or emergency department, call ahead and tell them about your recent travel, contact with someone diagnosed with COVID-19, and your symptoms. You should receive instructions from your physician's office regarding next steps of care.  . When you arrive at healthcare provider, tell the healthcare staff immediately you have returned from visiting [place], [place], [place], [place] or [place]; or traveled in the [place] to [place], [place], [place], or [place]; in the last two weeks or you have been in close contact with a person diagnosed with COVID-19 in the last 2 weeks.   . Tell the health care staff about your symptoms: fever, cough and shortness of breath. . After you have been seen by a medical provider, you will be either: o Tested for (COVID-19) and discharged home on quarantine except to seek medical care if symptoms worsen, and asked to  - Stay home and avoid contact with others until you get your results (4-5 days)  - Avoid travel on public transportation if possible (such as bus, train, or airplane) or o Sent to the Emergency Department by EMS for evaluation, COVID-19 testing, and possible admission depending on your condition and test results.  What to do if you are LOW RISK for COVID-19?  Reduce your  risk of any infection by using the same precautions used for avoiding the common cold or flu:  Marland Kitchen Wash your hands often with soap and warm water for at least 20 seconds.  If soap and water are not readily available, use an alcohol-based hand sanitizer with at least 60% alcohol.  . If coughing or sneezing, cover your mouth and nose by coughing or sneezing into the elbow areas of your shirt or coat, into a tissue or into your sleeve (not your hands). . Avoid shaking hands with others and consider head nods or verbal greetings only. . Avoid touching your eyes, nose, or mouth with unwashed hands.  . Avoid close contact with people who are sick. . Avoid places or events with large numbers of people in one location, like concerts or sporting events. . Carefully consider travel plans you have or are making. . If you are planning any travel outside or inside the [place], visit the CDC's Travelers' Health webpage for the latest health notices. . If you have some symptoms but not all symptoms, continue to monitor at home and seek medical attention if your symptoms worsen. . If you are having a medical emergency, call 911.   ADDITIONAL HEALTHCARE OPTIONS FOR PATIENTS  [place] [facility] / e-Visit: [email or web address]         [facility]: [facility]: [phone]                   [facility]: [place] Discharge Instructions for Patients Receiving Chemotherapy  Today you received the following chemotherapy agents Imfinzi.  To help prevent nausea and vomiting after your treatment, we encourage you to take your nausea medication as directed.  If you develop nausea and vomiting that is not controlled by your nausea medication, call the clinic.   BELOW ARE SYMPTOMS THAT SHOULD BE REPORTED IMMEDIATELY:  *FEVER GREATER THAN 100.5 F  *CHILLS WITH OR WITHOUT FEVER  NAUSEA AND  VOMITING THAT IS NOT CONTROLLED WITH YOUR NAUSEA MEDICATION  *UNUSUAL SHORTNESS OF BREATH  *UNUSUAL BRUISING OR BLEEDING  TENDERNESS IN MOUTH AND THROAT WITH OR WITHOUT PRESENCE  OF ULCERS  *URINARY PROBLEMS  *BOWEL PROBLEMS  UNUSUAL RASH Items with * indicate a potential emergency and should be followed up as soon as possible.  Feel free to call the clinic should you have any questions or concerns. The clinic phone number is [phone].  Please show the [place] at check-in to the Emergency Department and triage nurse.

## 2018-06-22 NOTE — Progress Notes (Signed)
[place] [phone]   [phone]  OFFICE PROGRESS NOTE  [name], MD [place] [address]  DIAGNOSIS: Stage IIIB (T3, N3, M0) non-small cell lung cancer, squamous cell carcinoma presented with large right middle lobe lung mass in addition to right hilar, subcarinal and right supraclavicular lymphadenopathy diagnosed in August 2019.  PRIOR THERAPY: Concurrent chemoradiation with weekly carboplatin for AUC of 2 and paclitaxel 45 NG/M2.  Status post 6 cycles with partial response.   CURRENT THERAPY: Consolidation immunotherapy with Imfinzi 10 mg/KG every 2 weeks started February 28, 2018.  Status post 8 cycles.  INTERVAL HISTORY: Lisa [name] 85 y.o. female returns to the clinic today for follow-up visit.  The patient is feeling fine today with no concerning complaints.  She denied having any chest pain, shortness of breath, cough or hemoptysis.  She denied having any fever or chills.  She has no nausea, vomiting, diarrhea or constipation.  She denied having any headache or visual changes.  The patient continues to tolerate her treatment fairly well with no concerning complaints.  She is here today for evaluation before starting cycle #9. Marland Kitchen   MEDICAL HISTORY: Past Medical History:  Diagnosis Date  . Diverticulosis   . Headache   . Hemorrhoids    internal and external  . Hyperlipidemia 01/29/1991  . Hypertension 10/29/1995  . Hypothyroidism 10/29/1995  . Lung mass    right middle lobe  . Melanoma ([place])    h/o, local excision. no chemo or rady.   . Skin cancer (melanoma) ([place])   . Tubular adenoma of colon   . Wears dentures     ALLERGIES:  is allergic to atorvastatin; celebrex [celecoxib]; cholecalciferol; and simvastatin.  MEDICATIONS:  Current Outpatient Medications  Medication Sig Dispense Refill  . amLODipine (NORVASC) 10 MG tablet Take 1 tablet (10 mg total) by mouth daily. 90 tablet 3  .  Cholecalciferol (VITAMIN D) 2000 units tablet Take 2,000 Units by mouth 2 (two) times daily.    . Coenzyme Q10 100 MG capsule Take 100 mg by mouth daily.     Marland Kitchen docusate sodium (COLACE) 100 MG capsule Take 100 mg by mouth daily as needed for mild constipation.    . fluticasone (FLONASE) 50 MCG/ACT nasal spray USE 2 SPRAYS IN EACH NOSTRIL DAILY AS NEEDED FOR RHINITIS (Patient not taking: Reported on 05/11/2018) 48 g 3  . levothyroxine (SYNTHROID, LEVOTHROID) 75 MCG tablet Take 1 tablet (75 mcg total) by mouth daily. 90 tablet 3  . mirtazapine (REMERON) 7.5 MG tablet Take 1 tablet (7.5 mg total) by mouth at bedtime. 30 tablet 5  . potassium chloride SA (K-DUR,KLOR-CON) 20 MEQ tablet Take 1 tablet (20 mEq total) by mouth 2 (two) times daily. 30 tablet 0  . prochlorperazine (COMPAZINE) 10 MG tablet Take 1 tablet (10 mg total) by mouth every 6 (six) hours as needed for nausea or vomiting. (Patient not taking: Reported on 05/11/2018) 30 tablet 0  . rivaroxaban (XARELTO) 20 MG TABS tablet Take 1 tablet (20 mg total) by mouth daily with supper. (Patient not taking: Reported on 05/11/2018) 30 tablet 1  . Rivaroxaban 15 & 20 MG TBPK Take as directed on package: Start with one 15mg  tablet by mouth twice a day with food. On Day 22, switch to one 20mg  tablet once a day with food. 51 each 0  . sennosides-docusate sodium (SENOKOT-S) 8.6-50 MG tablet Take 1 tablet by mouth daily as  needed for constipation.     No current facility-administered medications for this visit.    Facility-Administered Medications Ordered in Other Visits  Medication Dose Route Frequency Provider Last Rate Last Dose  . acetaminophen (TYLENOL) tablet 650 mg  650 mg Oral Once Nicholas Lose, MD        SURGICAL HISTORY:  Past Surgical History:  Procedure Laterality Date  . BRONCHIAL BIOPSY  11/23/2017   Procedure: BRONCHIAL BIOPSIES;  Surgeon: Grace Isaac, MD;  Location: [facility];  Service: Thoracic;;  . cataract surgery  2007, 2008    repair bilat  . DG BARIUM ENEMA ([place] [facility]) N/A 2016   [name]  . HERNIA REPAIR  04/25/2001  . IR US GUIDE BX ASP/DRAIN  11/17/2017  . KNEE SURGERY     meniscus tear per Dr. Ronnie Derby, R knee  . MULTIPLE TOOTH EXTRACTIONS    . SEPTOPLASTY  2006   Dr. Ernesto Rutherford  . SKIN CANCER EXCISION    . TONSILLECTOMY  1954  . VAGINAL HYSTERECTOMY    . VIDEO BRONCHOSCOPY WITH ENDOBRONCHIAL ULTRASOUND N/A 11/23/2017   Procedure: VIDEO BRONCHOSCOPY WITH ENDOBRONCHIAL ULTRASOUND;  Surgeon: Grace Isaac, MD;  Location: [facility];  Service: Thoracic;  Laterality: N/A;    REVIEW OF SYSTEMS:  A comprehensive review of systems was negative except for: Respiratory: positive for dyspnea on exertion   PHYSICAL EXAMINATION: General appearance: alert, cooperative, fatigued and no distress Head: Normocephalic, without obvious abnormality, atraumatic Neck: no adenopathy, no JVD, supple, symmetrical, trachea midline and thyroid not enlarged, symmetric, no tenderness/mass/nodules Lymph nodes: Cervical, supraclavicular, and axillary nodes normal. Resp: clear to auscultation bilaterally Back: symmetric, no curvature. ROM normal. No CVA tenderness. Cardio: regular rate and rhythm, S1, S2 normal, no murmur, click, rub or gallop GI: soft, non-tender; bowel sounds normal; no masses,  no organomegaly Extremities: extremities normal, atraumatic, no cyanosis or edema  ECOG PERFORMANCE STATUS: 1 - Symptomatic but completely ambulatory  Blood pressure 119/73, pulse 91, temperature 97.6 F (36.4 C), temperature source Oral, resp. rate 17, height 5\' 5"  (1.651 m), weight 155 lb 12.8 oz (70.7 kg), SpO2 98 %.  LABORATORY DATA: Lab Results  Component Value Date   WBC 4.4 06/22/2018   HGB 12.7 06/22/2018   HCT 40.6 06/22/2018   MCV 91.4 06/22/2018   PLT 134 (L) 06/22/2018      Chemistry      Component Value Date/Time   NA 144 06/09/2018 1137   K 3.8 06/09/2018 1137   CL 109 06/09/2018 1137   CO2 24 06/09/2018 1137    BUN 23 06/09/2018 1137   CREATININE 1.43 (H) 06/09/2018 1137      Component Value Date/Time   CALCIUM 9.5 06/09/2018 1137   ALKPHOS 65 06/09/2018 1137   AST 20 06/09/2018 1137   ALT 10 06/09/2018 1137   BILITOT 0.5 06/09/2018 1137       RADIOGRAPHIC STUDIES: No results found.  ASSESSMENT AND PLAN: This is a very pleasant 85 years old white female with stage IIIb non-small cell lung cancer, squamous cell carcinoma.  She is currently undergoing a course of concurrent chemoradiation with weekly carboplatin and paclitaxel status post 6 cycles.  She tolerated this treatment well with partial response. She is currently undergoing consolidation treatment with immunotherapy with Imfinzi status post 8 cycles.   The patient has been tolerating this treatment well. I recommended for her to proceed with cycle #9 today as scheduled. I will see her back for follow-up visit in 2 weeks for  evaluation before the next cycle of her treatment. The patient was advised to call immediately if she has any concerning symptoms in the interval. The patient voices understanding of current disease status and treatment options and is in agreement with the current care plan. All questions were answered. The patient knows to call the clinic with any problems, questions or concerns. We can certainly see the patient much sooner if necessary.  I spent 10 minutes counseling the patient face to face. The total time spent in the appointment was 15 minutes.  Disclaimer: This note was dictated with voice recognition software. Similar sounding words can inadvertently be transcribed and may not be corrected upon review.

## 2018-06-23 ENCOUNTER — Telehealth: Payer: Self-pay | Admitting: Medical Oncology

## 2018-06-23 NOTE — Telephone Encounter (Signed)
Can she hold xarelto for 2 days for port a cath?

## 2018-06-29 ENCOUNTER — Encounter: Payer: Self-pay | Admitting: General Practice

## 2018-06-29 NOTE — Progress Notes (Signed)
[place] Team contacted patient to assess for food insecurity and other psychosocial needs during current COVID19 pandemic. Has husband and son who care for her and are able to meet all her needs.     Patient/family expressed no needs at this time.  Support Team member encouraged patient to call if changes occur or they have any other questions/concerns.   Beverely Pace, [place]

## 2018-06-30 ENCOUNTER — Other Ambulatory Visit: Payer: Self-pay | Admitting: Family Medicine

## 2018-06-30 ENCOUNTER — Telehealth: Payer: Self-pay | Admitting: Medical Oncology

## 2018-06-30 NOTE — Telephone Encounter (Signed)
I told Lisa [name] that the  pt may continue vit d as prescribed and IR will call her regarding xarelto dose for port a cath procedure.

## 2018-07-04 ENCOUNTER — Other Ambulatory Visit: Payer: Self-pay | Admitting: Student

## 2018-07-04 ENCOUNTER — Other Ambulatory Visit: Payer: Self-pay | Admitting: Radiology

## 2018-07-05 ENCOUNTER — Other Ambulatory Visit: Payer: Self-pay | Admitting: Internal Medicine

## 2018-07-05 ENCOUNTER — Encounter (HOSPITAL_COMMUNITY): Payer: Self-pay | Admitting: Interventional Radiology

## 2018-07-05 ENCOUNTER — Ambulatory Visit (HOSPITAL_COMMUNITY)
Admission: RE | Admit: 2018-07-05 | Discharge: 2018-07-05 | Disposition: A | Discharge status: Home / Self Care | Payer: Medicare Other | Source: Ambulatory Visit | Attending: Internal Medicine | Admitting: Internal Medicine

## 2018-07-05 ENCOUNTER — Other Ambulatory Visit: Payer: Self-pay

## 2018-07-05 DIAGNOSIS — C3490 Malignant neoplasm of unspecified part of unspecified bronchus or lung: Secondary | ICD-10-CM | POA: Insufficient documentation

## 2018-07-05 DIAGNOSIS — Z79890 Hormone replacement therapy (postmenopausal): Secondary | ICD-10-CM | POA: Diagnosis not present

## 2018-07-05 DIAGNOSIS — Z9221 Personal history of antineoplastic chemotherapy: Secondary | ICD-10-CM | POA: Insufficient documentation

## 2018-07-05 DIAGNOSIS — I10 Essential (primary) hypertension: Secondary | ICD-10-CM | POA: Diagnosis not present

## 2018-07-05 DIAGNOSIS — E785 Hyperlipidemia, unspecified: Secondary | ICD-10-CM | POA: Insufficient documentation

## 2018-07-05 DIAGNOSIS — Z452 Encounter for adjustment and management of vascular access device: Secondary | ICD-10-CM | POA: Diagnosis not present

## 2018-07-05 DIAGNOSIS — Z87891 Personal history of nicotine dependence: Secondary | ICD-10-CM | POA: Insufficient documentation

## 2018-07-05 DIAGNOSIS — E039 Hypothyroidism, unspecified: Secondary | ICD-10-CM | POA: Diagnosis not present

## 2018-07-05 DIAGNOSIS — Z7901 Long term (current) use of anticoagulants: Secondary | ICD-10-CM | POA: Insufficient documentation

## 2018-07-05 DIAGNOSIS — I878 Other specified disorders of veins: Secondary | ICD-10-CM

## 2018-07-05 DIAGNOSIS — Z79899 Other long term (current) drug therapy: Secondary | ICD-10-CM | POA: Diagnosis not present

## 2018-07-05 DIAGNOSIS — K5790 Diverticulosis of intestine, part unspecified, without perforation or abscess without bleeding: Secondary | ICD-10-CM | POA: Insufficient documentation

## 2018-07-05 HISTORY — PX: IR IMAGING GUIDED PORT INSERTION: IMG5740

## 2018-07-05 LAB — PROTIME-INR
INR: 1 (ref 0.8–1.2)
Prothrombin Time: 13.1 seconds (ref 11.4–15.2)

## 2018-07-05 LAB — CBC
HCT: 41.2 % (ref 36.0–46.0)
Hemoglobin: 12.6 g/dL (ref 12.0–15.0)
MCH: 28.3 pg (ref 26.0–34.0)
MCHC: 30.6 g/dL (ref 30.0–36.0)
MCV: 92.6 fL (ref 80.0–100.0)
Platelets: 141 10*3/uL — ABNORMAL LOW (ref 150–400)
RBC: 4.45 MIL/uL (ref 3.87–5.11)
RDW: 15.2 % (ref 11.5–15.5)
WBC: 5.2 10*3/uL (ref 4.0–10.5)
nRBC: 0 % (ref 0.0–0.2)

## 2018-07-05 LAB — APTT: aPTT: 31 seconds (ref 24–36)

## 2018-07-05 MED ORDER — LIDOCAINE-EPINEPHRINE (PF) 1 %-1:200000 IJ SOLN
INTRAMUSCULAR | Status: AC
  Filled 2018-07-05: qty 30

## 2018-07-05 MED ORDER — MIDAZOLAM HCL 2 MG/2ML IJ SOLN
INTRAMUSCULAR | Status: AC | PRN
  Administered 2018-07-05: 1 mg via INTRAVENOUS
  Administered 2018-07-05: 0.5 mg via INTRAVENOUS

## 2018-07-05 MED ORDER — CEFAZOLIN SODIUM-DEXTROSE 2-4 GM/100ML-% IV SOLN: 2.0000 g | Freq: Once | INTRAVENOUS | Status: DC

## 2018-07-05 MED ORDER — CEFAZOLIN SODIUM-DEXTROSE 2-4 GM/100ML-% IV SOLN
INTRAVENOUS | Status: AC
  Administered 2018-07-05: 2000 mg
  Filled 2018-07-05: qty 100

## 2018-07-05 MED ORDER — LIDOCAINE HCL 1 % IJ SOLN
INTRAMUSCULAR | Status: AC | PRN
  Administered 2018-07-05: 5 mL

## 2018-07-05 MED ORDER — MIDAZOLAM HCL 2 MG/2ML IJ SOLN
INTRAMUSCULAR | Status: AC
  Filled 2018-07-05: qty 2

## 2018-07-05 MED ORDER — HEPARIN SOD (PORK) LOCK FLUSH 100 UNIT/ML IV SOLN
INTRAVENOUS | Status: AC
  Administered 2018-07-05: 500 [IU]
  Filled 2018-07-05: qty 5

## 2018-07-05 MED ORDER — SODIUM CHLORIDE 0.9 % IV SOLN: INTRAVENOUS | Status: DC

## 2018-07-05 MED ORDER — FENTANYL CITRATE (PF) 100 MCG/2ML IJ SOLN
INTRAMUSCULAR | Status: AC
  Filled 2018-07-05: qty 2

## 2018-07-05 MED ORDER — FENTANYL CITRATE (PF) 100 MCG/2ML IJ SOLN
INTRAMUSCULAR | Status: AC | PRN
  Administered 2018-07-05 (×2): 25 ug via INTRAVENOUS

## 2018-07-05 NOTE — Sedation Documentation (Signed)
Pt in room being prepped for procedure at this time. Pt has no complaints, VSS

## 2018-07-05 NOTE — Sedation Documentation (Signed)
Patient is resting comfortably. 

## 2018-07-05 NOTE — Procedures (Signed)
  Procedure: R IJ Port    EBL:   minimal Complications:  none immediate  See full dictation in [facility] PACS.  D. Flora Parks MD Main # [phone] Pager  [phone]    

## 2018-07-05 NOTE — Sedation Documentation (Signed)
Vital signs stable. 

## 2018-07-05 NOTE — H&P (Signed)
Chief Complaint: Patient was seen in consultation today for lung cancer/Port-a-cath placement.  Referring Physician(s): Mohamed,Mohamed  Supervising Physician: Arne Cleveland  Patient Status: [facility] - Out-pt  History of Present Illness: Lisa [name] is a 85 y.o. female with a past medical history of hypertension, hyperlipidemia, headaches, lung cancer, diverticulosis, hypothyroidism, and melanoma. She was unfortunately diagnosed with non-small cell carcinoma (SCC) in 10/2017. Her cancer is managed by Dr. Julien Nordmann. She is currently undergoing a chemotherapy regimen, but unfortunately has poor venous access during treatments.  IR requested by Dr. Julien Nordmann for possible image-guided Port-a-cath insertion. Patient awake and alert sitting in bed with no complaints at this time. Accompanied by granddaughter at bedside. Denies fever, chills, chest pain, dyspnea, abdominal pain, or headache.  Patient is currently taking Xarelto 20 mg once daily- reports last dose Saturday 07/02/2018.   Past Medical History:  Diagnosis Date  . Diverticulosis   . Headache   . Hemorrhoids    internal and external  . Hyperlipidemia 01/29/1991  . Hypertension 10/29/1995  . Hypothyroidism 10/29/1995  . Lung mass    right middle lobe  . Melanoma ([place])    h/o, local excision. no chemo or rady.   . Skin cancer (melanoma) ([place])   . Tubular adenoma of colon   . Wears dentures     Past Surgical History:  Procedure Laterality Date  . BRONCHIAL BIOPSY  11/23/2017   Procedure: BRONCHIAL BIOPSIES;  Surgeon: Grace Isaac, MD;  Location: [facility] OR;  Service: Thoracic;;  . cataract surgery  2007, 2008   repair bilat  . DG BARIUM ENEMA ([place] HX) N/A 2016   [name]  . HERNIA REPAIR  04/25/2001  . IR US GUIDE BX ASP/DRAIN  11/17/2017  . KNEE SURGERY     meniscus tear per Dr. Ronnie Derby, R knee  . MULTIPLE TOOTH EXTRACTIONS    . SEPTOPLASTY  2006   Dr. Ernesto Rutherford  . SKIN CANCER EXCISION    . TONSILLECTOMY  1954   . VAGINAL HYSTERECTOMY    . VIDEO BRONCHOSCOPY WITH ENDOBRONCHIAL ULTRASOUND N/A 11/23/2017   Procedure: VIDEO BRONCHOSCOPY WITH ENDOBRONCHIAL ULTRASOUND;  Surgeon: Grace Isaac, MD;  Location: [facility] OR;  Service: Thoracic;  Laterality: N/A;    Allergies: Atorvastatin; Celebrex [celecoxib]; Cholecalciferol; and Simvastatin  Medications: Prior to Admission medications   Medication Sig Start Date End Date Taking? Authorizing Provider  amLODipine (NORVASC) 10 MG tablet Take 1 tablet (10 mg total) by mouth daily. 06/17/17   [name], MD  Cholecalciferol (VITAMIN D) 2000 units tablet Take 2,000 Units by mouth 2 (two) times daily.    [provider]  Coenzyme Q10 100 MG capsule Take 100 mg by mouth daily.     [provider]  docusate sodium (COLACE) 100 MG capsule Take 100 mg by mouth daily as needed for mild constipation.    [provider]  fluticasone (FLONASE) 50 MCG/ACT nasal spray USE 2 SPRAYS IN EACH NOSTRIL DAILY AS NEEDED FOR RHINITIS Patient not taking: Reported on 05/11/2018 06/17/17   [name], MD  levothyroxine Wilmer Floor, LEVOTHROID) 75 MCG tablet Take 1 tablet by mouth once daily 06/30/18   [name], MD  lidocaine-prilocaine (EMLA) cream Apply 1 application topically as needed. 06/22/18   Curt Bears, MD  mirtazapine (REMERON) 7.5 MG tablet Take 1 tablet (7.5 mg total) by mouth at bedtime. 03/18/18   Tanner, Lyndon Code., PA-C  potassium chloride SA (K-DUR,KLOR-CON) 20 MEQ tablet Take 1 tablet (20 mEq total) by  mouth 2 (two) times daily. 04/28/18   Maryanna Shape, NP  prochlorperazine (COMPAZINE) 10 MG tablet Take 1 tablet (10 mg total) by mouth every 6 (six) hours as needed for nausea or vomiting. Patient not taking: Reported on 05/11/2018 12/02/17   Curt Bears, MD  rivaroxaban (XARELTO) 20 MG TABS tablet Take 1 tablet (20 mg total) by mouth daily with supper. Patient not taking: Reported on 05/11/2018 04/28/18   Maryanna Shape,  NP  Rivaroxaban 15 & 20 MG TBPK Take as directed on package: Start with one 15mg  tablet by mouth twice a day with food. On Day 22, switch to one 20mg  tablet once a day with food. 04/19/18   Gardenia Phlegm, NP  sennosides-docusate sodium (SENOKOT-S) 8.6-50 MG tablet Take 1 tablet by mouth daily as needed for constipation.    [provider]     Family History  Problem Relation Age of Onset  . Stroke Mother   . Heart disease Mother        CHF  . Stomach cancer Sister   . Cancer Brother        Metastatic cancer  . Colon cancer Neg Hx   . Breast cancer Neg Hx     Social History   Socioeconomic History  . Marital status: Married    Spouse name: Not on file  . Number of children: 2  . Years of education: Not on file  . Highest education level: Not on file  Occupational History  . 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 Financial resource strain: Not on file  . Food insecurity:    Worry: Not on file    Inability: Not on file  . Transportation needs:    Medical: Not on file    Non-medical: Not on file  Tobacco Use  . Smoking status: Former Smoker    Packs/day: 1.00    Years: 30.00    Pack years: 30.00    Types: Cigarettes    Start date: 10/28/1958    Last attempt to quit: 10/27/1988    Years since quitting: 29.7  . Smokeless tobacco: Never Used  . Tobacco comment: quit 1994  Substance and Sexual Activity  . Alcohol use: No  . Drug use: No  . Sexual activity: Never  Lifestyle  . Physical activity:    Days per week: Not on file    Minutes per session: Not on file  . Stress: Not on file  Relationships  . Social connections:    Talks on phone: Not on file    Gets together: Not on file    Attends religious service: Not on file    Active member of club or organization: Not on file    Attends meetings of clubs or organizations: Not on file    Relationship status: Not on file  Other Topics Concern   . Not on file  Social History Narrative   Married 1957 lives with husband     Review of Systems: A 12 point ROS discussed and pertinent positives are indicated in the HPI above.  All other systems are negative.  Review of Systems  Constitutional: Negative for chills and fever.  Respiratory: Negative for shortness of breath and wheezing.   Cardiovascular: Negative for chest pain and palpitations.  Gastrointestinal: Negative for abdominal pain.  Neurological: Negative for headaches.  Psychiatric/Behavioral: Negative for behavioral problems and confusion.    Vital Signs: BP Marland Kitchen)  158/88 (BP Location: Left Arm)   Pulse 75   Temp 98 F (36.7 C)   Resp 20   Ht 5\' 8"  (1.727 m)   Wt 150 lb (68 kg)   SpO2 96%   BMI 22.81 kg/m   Physical Exam Vitals signs and nursing note reviewed.  Constitutional:      General: She is not in acute distress.    Appearance: Normal appearance.  Cardiovascular:     Rate and Rhythm: Normal rate and regular rhythm.     Heart sounds: Normal heart sounds. No murmur.  Pulmonary:     Effort: Pulmonary effort is normal. No respiratory distress.     Breath sounds: Normal breath sounds. No wheezing.  Skin:    General: Skin is warm and dry.  Neurological:     Mental Status: She is alert and oriented to person, place, and time.  Psychiatric:        Mood and Affect: Mood normal.        Behavior: Behavior normal.        Thought Content: Thought content normal.        Judgment: Judgment normal.      MD Evaluation Airway: WNL Heart: WNL Abdomen: WNL Chest/ Lungs: WNL ASA  Classification: 3 Mallampati/Airway Score: One   Imaging: No results found.  Labs:  CBC: Recent Labs    05/25/18 1247 06/09/18 1137 06/22/18 0955 07/05/18 1006  WBC 5.4 4.6 4.4 5.2  HGB 11.7* 13.0 12.7 12.6  HCT 38.1 42.8 40.6 41.2  PLT 140* 130* 134* 141*    COAGS: Recent Labs    11/17/17 1226 07/05/18 1006  INR 0.99 1.0  APTT 30 31    BMP: Recent Labs     05/11/18 0811 05/25/18 1247 06/09/18 1137 06/22/18 0955  NA 144 142 144 145  K 4.0 4.3 3.8 3.8  CL 112* 109 109 110  CO2 22 24 24 26   GLUCOSE 93 80 85 97  BUN 17 19 23  29*  CALCIUM 9.7 9.1 9.5 9.5  CREATININE 1.57* 1.32* 1.43* 1.40*  GFRNONAA 30* 37* 34* 35*  GFRAA 35* 43* 39* 40*    LIVER FUNCTION TESTS: Recent Labs    05/11/18 0811 05/25/18 1247 06/09/18 1137 06/22/18 0955  BILITOT 0.4 0.4 0.5 0.5  AST 18 20 20 22   ALT 7 9 10 16   ALKPHOS 73 72 65 68  PROT 7.0 6.8 6.7 6.6  ALBUMIN 3.2* 3.4* 3.4* 3.4*     Assessment and Plan:  Lung cancer. Plan for image-guided Port-a-cath placement today with Dr. Vernard Gambles. Patient is NPO. Afebrile and WBCs WNL. Last dose Xarelto Saturday 07/02/2018- ok to proceed per IR protocol. INR 1.0 seconds today.  Risks and benefits of image guided port-a-catheter placement was discussed with the patient including, but not limited to bleeding, infection, pneumothorax, or fibrin sheath development and need for additional procedures. All of the patient's questions were answered, patient is agreeable to proceed. Consent signed and in chart.   Thank you for this interesting consult.  I greatly enjoyed meeting ANNALIE WENNER and look forward to participating in their care.  A copy of this report was sent to the requesting provider on this date.  Electronically Signed: Earley Abide, PA-C 07/05/2018, 12:19 PM   I spent a total of 40 Minutes in face to face in clinical consultation, greater than 50% of which was counseling/coordinating care for lung cancer/Port-a-cath placement.

## 2018-07-05 NOTE — Sedation Documentation (Signed)
Patient is resting comfortably. No complaints at this time, procedure started

## 2018-07-05 NOTE — Discharge Instructions (Signed)
Implanted Port Insertion, Care After  This sheet gives you information about how to care for yourself after your procedure. Your health care provider may also give you more specific instructions. If you have problems or questions, contact your health care provider.  What can I expect after the procedure?  After the procedure, it is common to have:  · Discomfort at the port insertion site.  · Bruising on the skin over the port. This should improve over 3-4 days.  Follow these instructions at home:  Port care  · After your port is placed, you will get a manufacturer's information card. The card has information about your port. Keep this card with you at all times.  · Take care of the port as told by your health care provider. Ask your health care provider if you or a family member can get training for taking care of the port at home. A home health care nurse may also take care of the port.  · Make sure to remember what type of port you have.  Incision care         · Follow instructions from your health care provider about how to take care of your port insertion site. Make sure you:  ? Wash your hands with soap and water before and after you change your bandage (dressing). If soap and water are not available, use hand sanitizer.  ? Change your dressing as told by your health care provider.  ? Leave stitches (sutures), skin glue, or adhesive strips in place. These skin closures may need to stay in place for 2 weeks or longer. If adhesive strip edges start to loosen and curl up, you may trim the loose edges. Do not remove adhesive strips completely unless your health care provider tells you to do that.  · Check your port insertion site every day for signs of infection. Check for:  ? Redness, swelling, or pain.  ? Fluid or blood.  ? Warmth.  ? Pus or a bad smell.  Activity  · Return to your normal activities as told by your health care provider. Ask your health care provider what activities are safe for you.  · Do not  lift anything that is heavier than 10 lb (4.5 kg), or the limit that you are told, until your health care provider says that it is safe.  General instructions  · Take over-the-counter and prescription medicines only as told by your health care provider.  · Do not take baths, swim, or use a hot tub until your health care provider approves. Ask your health care provider if you may take showers. You may only be allowed to take sponge baths.  · Do not drive for 24 hours if you were given a sedative during your procedure.  · Wear a medical alert bracelet in case of an emergency. This will tell any health care providers that you have a port.  · Keep all follow-up visits as told by your health care provider. This is important.  Contact a health care provider if:  · You cannot flush your port with saline as directed, or you cannot draw blood from the port.  · You have a fever or chills.  · You have redness, swelling, or pain around your port insertion site.  · You have fluid or blood coming from your port insertion site.  · Your port insertion site feels warm to the touch.  · You have pus or a bad smell coming from the port   insertion site.  Get help right away if:  · You have chest pain or shortness of breath.  · You have bleeding from your port that you cannot control.  Summary  · Take care of the port as told by your health care provider. Keep the manufacturer's information card with you at all times.  · Change your dressing as told by your health care provider.  · Contact a health care provider if you have a fever or chills or if you have redness, swelling, or pain around your port insertion site.  · Keep all follow-up visits as told by your health care provider.  This information is not intended to replace advice given to you by your health care provider. Make sure you discuss any questions you have with your health care provider.  Document Released: 01/04/2013 Document Revised: 10/12/2017 Document Reviewed:  10/12/2017  Elsevier Interactive Patient Education © 2019 [employer]

## 2018-07-06 ENCOUNTER — Other Ambulatory Visit: Payer: Self-pay

## 2018-07-06 ENCOUNTER — Inpatient Hospital Stay: Payer: Medicare Other

## 2018-07-06 ENCOUNTER — Inpatient Hospital Stay (HOSPITAL_BASED_OUTPATIENT_CLINIC_OR_DEPARTMENT_OTHER): Payer: Medicare Other | Admitting: Physician Assistant

## 2018-07-06 ENCOUNTER — Encounter: Payer: Self-pay | Admitting: Physician Assistant

## 2018-07-06 ENCOUNTER — Inpatient Hospital Stay: Payer: Medicare Other | Attending: Internal Medicine

## 2018-07-06 ENCOUNTER — Inpatient Hospital Stay (HOSPITAL_BASED_OUTPATIENT_CLINIC_OR_DEPARTMENT_OTHER): Payer: Medicare Other | Admitting: Medical

## 2018-07-06 VITALS — BP 160/99 | HR 87 | Temp 98.1°F | Resp 20 | Ht 68.0 in | Wt 156.3 lb

## 2018-07-06 DIAGNOSIS — C3491 Malignant neoplasm of unspecified part of right bronchus or lung: Secondary | ICD-10-CM

## 2018-07-06 DIAGNOSIS — Z85820 Personal history of malignant melanoma of skin: Secondary | ICD-10-CM | POA: Diagnosis not present

## 2018-07-06 DIAGNOSIS — Z7901 Long term (current) use of anticoagulants: Secondary | ICD-10-CM

## 2018-07-06 DIAGNOSIS — Z79899 Other long term (current) drug therapy: Secondary | ICD-10-CM | POA: Insufficient documentation

## 2018-07-06 DIAGNOSIS — C770 Secondary and unspecified malignant neoplasm of lymph nodes of head, face and neck: Secondary | ICD-10-CM

## 2018-07-06 DIAGNOSIS — Z95828 Presence of other vascular implants and grafts: Secondary | ICD-10-CM

## 2018-07-06 DIAGNOSIS — Z5112 Encounter for antineoplastic immunotherapy: Secondary | ICD-10-CM

## 2018-07-06 DIAGNOSIS — C342 Malignant neoplasm of middle lobe, bronchus or lung: Secondary | ICD-10-CM | POA: Insufficient documentation

## 2018-07-06 DIAGNOSIS — E039 Hypothyroidism, unspecified: Secondary | ICD-10-CM | POA: Insufficient documentation

## 2018-07-06 DIAGNOSIS — I10 Essential (primary) hypertension: Secondary | ICD-10-CM

## 2018-07-06 DIAGNOSIS — E785 Hyperlipidemia, unspecified: Secondary | ICD-10-CM | POA: Insufficient documentation

## 2018-07-06 LAB — CBC WITH DIFFERENTIAL (CANCER CENTER ONLY)
Abs Immature Granulocytes: 0.02 10*3/uL (ref 0.00–0.07)
Basophils Absolute: 0 10*3/uL (ref 0.0–0.1)
Basophils Relative: 0 %
Eosinophils Absolute: 0.1 10*3/uL (ref 0.0–0.5)
Eosinophils Relative: 2 %
HCT: 38.1 % (ref 36.0–46.0)
Hemoglobin: 12.1 g/dL (ref 12.0–15.0)
Immature Granulocytes: 0 %
Lymphocytes Relative: 20 %
Lymphs Abs: 0.9 10*3/uL (ref 0.7–4.0)
MCH: 29 pg (ref 26.0–34.0)
MCHC: 31.8 g/dL (ref 30.0–36.0)
MCV: 91.4 fL (ref 80.0–100.0)
Monocytes Absolute: 0.4 10*3/uL (ref 0.1–1.0)
Monocytes Relative: 8 %
Neutro Abs: 3.2 10*3/uL (ref 1.7–7.7)
Neutrophils Relative %: 70 %
Platelet Count: 125 10*3/uL — ABNORMAL LOW (ref 150–400)
RBC: 4.17 MIL/uL (ref 3.87–5.11)
RDW: 15.3 % (ref 11.5–15.5)
WBC Count: 4.6 10*3/uL (ref 4.0–10.5)
nRBC: 0 % (ref 0.0–0.2)

## 2018-07-06 LAB — CMP (CANCER CENTER ONLY)
ALT: 10 U/L (ref 0–44)
AST: 20 U/L (ref 15–41)
Albumin: 3.5 g/dL (ref 3.5–5.0)
Alkaline Phosphatase: 60 U/L (ref 38–126)
Anion gap: 9 (ref 5–15)
BUN: 19 mg/dL (ref 8–23)
CO2: 25 mmol/L (ref 22–32)
Calcium: 9.3 mg/dL (ref 8.9–10.3)
Chloride: 110 mmol/L (ref 98–111)
Creatinine: 1.41 mg/dL — ABNORMAL HIGH (ref 0.44–1.00)
GFR, Est AFR Am: 40 mL/min — ABNORMAL LOW (ref >60)
GFR, Estimated: 34 mL/min — ABNORMAL LOW (ref >60)
Glucose, Bld: 86 mg/dL (ref 70–99)
Potassium: 3.9 mmol/L (ref 3.5–5.1)
Sodium: 144 mmol/L (ref 135–145)
Total Bilirubin: 0.6 mg/dL (ref 0.3–1.2)
Total Protein: 6.6 g/dL (ref 6.5–8.1)

## 2018-07-06 LAB — TSH: TSH: 3.248 u[IU]/mL (ref 0.308–3.960)

## 2018-07-06 MED ORDER — SODIUM CHLORIDE 0.9% FLUSH
10.0000 mL | INTRAVENOUS | Status: DC | PRN
  Administered 2018-07-06: 10 mL via INTRAVENOUS
  Filled 2018-07-06: qty 10

## 2018-07-06 MED ORDER — HEPARIN SOD (PORK) LOCK FLUSH 100 UNIT/ML IV SOLN
500.0000 [IU] | Freq: Once | INTRAVENOUS | Status: AC | PRN
  Administered 2018-07-06: 500 [IU]
  Filled 2018-07-06: qty 5

## 2018-07-06 MED ORDER — SODIUM CHLORIDE 0.9 % IV SOLN
10.0000 mg/kg | Freq: Once | INTRAVENOUS | Status: AC
  Administered 2018-07-06: 740 mg via INTRAVENOUS
  Filled 2018-07-06: qty 10

## 2018-07-06 MED ORDER — SODIUM CHLORIDE 0.9% FLUSH
10.0000 mL | INTRAVENOUS | Status: DC | PRN
  Administered 2018-07-06: 10 mL
  Filled 2018-07-06: qty 10

## 2018-07-06 MED ORDER — SODIUM CHLORIDE 0.9 % IV SOLN
Freq: Once | INTRAVENOUS | Status: AC
  Administered 2018-07-06: 12:00:00 via INTRAVENOUS
  Filled 2018-07-06: qty 250

## 2018-07-06 NOTE — Patient Instructions (Signed)
[facility] Discharge Instructions for Patients Receiving Chemotherapy  Today you received the following chemotherapy agents: Imfinzi.  To help prevent nausea and vomiting after your treatment, we encourage you to take your nausea medication as directed.   If you develop nausea and vomiting that is not controlled by your nausea medication, call the clinic.   BELOW ARE SYMPTOMS THAT SHOULD BE REPORTED IMMEDIATELY:  *FEVER GREATER THAN 100.5 F  *CHILLS WITH OR WITHOUT FEVER  NAUSEA AND VOMITING THAT IS NOT CONTROLLED WITH YOUR NAUSEA MEDICATION  *UNUSUAL SHORTNESS OF BREATH  *UNUSUAL BRUISING OR BLEEDING  TENDERNESS IN MOUTH AND THROAT WITH OR WITHOUT PRESENCE OF ULCERS  *URINARY PROBLEMS  *BOWEL PROBLEMS  UNUSUAL RASH Items with * indicate a potential emergency and should be followed up as soon as possible.  Feel free to call the clinic should you have any questions or concerns. The clinic phone number is [phone].  Please show the CHEMO ALERT CARD at check-in to the Emergency Department and triage nurse.   

## 2018-07-06 NOTE — Progress Notes (Signed)
[facility] OFFICE PROGRESS NOTE  [name], MD [place] [address]  DIAGNOSIS: Stage IIIB (T3, N3, M0) non-small cell lung cancer, squamous cell carcinoma presented with large right middle lobe lung mass in addition to right hilar, subcarinal and right supraclavicular lymphadenopathy diagnosed in August 2019.  PRIOR THERAPY: Concurrent chemoradiation with weekly carboplatin for AUC of 2 and paclitaxel 45 NG/M2.  Status post 6 cycles with partial response.  CURRENT THERAPY: Consolidation immunotherapy with Imfinzi 10 mg/KG every 2 weeks started February 28, 2018.  Status post 9 cycles.  INTERVAL HISTORY: Lisa [name] 85 y.o. female returns to the clinic today for a follow-up visit.  She is feeling well today without any concerning complaints except for some tenderness over the site where her Port-A-Cath was placed yesterday.  She tolerated the procedure well without any adverse effects. Otherwise she is feeling well today.  She denies any chest pain, shortness of breath, cough, or hemoptysis.  She denies any fever, chills, night sweats, or weight loss.  She denies any nausea, vomiting, diarrhea, or constipation.  She denies any headaches or visual changes.  She denies any rashes or skin changes.  She is here today for evaluation before starting cycle #10 of Imfinzi.   MEDICAL HISTORY: Past Medical History:  Diagnosis Date  . Diverticulosis   . Headache   . Hemorrhoids    internal and external  . Hyperlipidemia 01/29/1991  . Hypertension 10/29/1995  . Hypothyroidism 10/29/1995  . Lung mass    right middle lobe  . Melanoma ([facility])    h/o, local excision. no chemo or rady.   . Skin cancer (melanoma) ([facility])   . Tubular adenoma of colon   . Wears dentures     ALLERGIES:  is allergic to atorvastatin; celebrex [celecoxib]; cholecalciferol; and simvastatin.  MEDICATIONS:  Current Outpatient Medications  Medication Sig Dispense Refill  .  amLODipine (NORVASC) 10 MG tablet Take 1 tablet (10 mg total) by mouth daily. 90 tablet 3  . Cholecalciferol (VITAMIN D) 2000 units tablet Take 2,000 Units by mouth 2 (two) times daily.    . Coenzyme Q10 100 MG capsule Take 100 mg by mouth daily.     Marland Kitchen docusate sodium (COLACE) 100 MG capsule Take 100 mg by mouth daily as needed for mild constipation.    . fluticasone (FLONASE) 50 MCG/ACT nasal spray USE 2 SPRAYS IN EACH NOSTRIL DAILY AS NEEDED FOR RHINITIS (Patient not taking: Reported on 05/11/2018) 48 g 3  . levothyroxine (SYNTHROID, LEVOTHROID) 75 MCG tablet Take 1 tablet by mouth once daily 90 tablet 3  . lidocaine-prilocaine (EMLA) cream Apply 1 application topically as needed. 30 g 0  . mirtazapine (REMERON) 7.5 MG tablet Take 1 tablet (7.5 mg total) by mouth at bedtime. 30 tablet 5  . potassium chloride SA (K-DUR,KLOR-CON) 20 MEQ tablet Take 1 tablet (20 mEq total) by mouth 2 (two) times daily. 30 tablet 0  . prochlorperazine (COMPAZINE) 10 MG tablet Take 1 tablet (10 mg total) by mouth every 6 (six) hours as needed for nausea or vomiting. (Patient not taking: Reported on 05/11/2018) 30 tablet 0  . rivaroxaban (XARELTO) 20 MG TABS tablet Take 1 tablet (20 mg total) by mouth daily with supper. (Patient not taking: Reported on 05/11/2018) 30 tablet 1  . Rivaroxaban 15 & 20 MG TBPK Take as directed on package: Start with one 15mg  tablet by mouth twice a day with food. On Day 22, switch to one  20mg  tablet once a day with food. 51 each 0  . sennosides-docusate sodium (SENOKOT-S) 8.6-50 MG tablet Take 1 tablet by mouth daily as needed for constipation.     No current facility-administered medications for this visit.    Facility-Administered Medications Ordered in Other Visits  Medication Dose Route Frequency Provider Last Rate Last Dose  . acetaminophen (TYLENOL) tablet 650 mg  650 mg Oral Once Nicholas Lose, MD      . durvalumab (IMFINZI) 740 mg in sodium chloride 0.9 % 100 mL chemo infusion  10  mg/kg (Treatment Plan Recorded) Intravenous Once Curt Bears, MD      . heparin lock flush 100 unit/mL  500 Units Intracatheter Once PRN Curt Bears, MD      . sodium chloride flush (NS) 0.9 % injection 10 mL  10 mL Intracatheter PRN Curt Bears, MD        SURGICAL HISTORY:  Past Surgical History:  Procedure Laterality Date  . BRONCHIAL BIOPSY  11/23/2017   Procedure: BRONCHIAL BIOPSIES;  Surgeon: Grace Isaac, MD;  Location: [facility] OR;  Service: Thoracic;;  . cataract surgery  2007, 2008   repair bilat  . DG BARIUM ENEMA ([facility]) N/A 2016   [name]  . HERNIA REPAIR  04/25/2001  . IR IMAGING GUIDED PORT INSERTION  07/05/2018  . IR US GUIDE BX ASP/DRAIN  11/17/2017  . KNEE SURGERY     meniscus tear per Dr. Ronnie Derby, R knee  . MULTIPLE TOOTH EXTRACTIONS    . SEPTOPLASTY  2006   Dr. Ernesto Rutherford  . SKIN CANCER EXCISION    . TONSILLECTOMY  1954  . VAGINAL HYSTERECTOMY    . VIDEO BRONCHOSCOPY WITH ENDOBRONCHIAL ULTRASOUND N/A 11/23/2017   Procedure: VIDEO BRONCHOSCOPY WITH ENDOBRONCHIAL ULTRASOUND;  Surgeon: Grace Isaac, MD;  Location: [facility];  Service: Thoracic;  Laterality: N/A;    REVIEW OF SYSTEMS:   Review of Systems  Constitutional: Negative for appetite change, chills, fatigue, fever and unexpected weight change.  HENT:   Negative for mouth sores, nosebleeds, sore throat and trouble swallowing.   Eyes: Negative for eye problems and icterus.  Respiratory: Negative for cough, hemoptysis, shortness of breath and wheezing.   Cardiovascular: Negative for chest pain and leg swelling.  Gastrointestinal: Negative for abdominal pain, constipation, diarrhea, nausea and vomiting.  Genitourinary: Negative for bladder incontinence, difficulty urinating, dysuria, frequency and hematuria.   Musculoskeletal: Negative for back pain, gait problem, neck pain and neck stiffness.  Skin: Negative for itching and rash.  Neurological: Negative for dizziness, extremity weakness, gait  problem, headaches, light-headedness and seizures.  Hematological: Negative for adenopathy. Does not bruise/bleed easily.  Psychiatric/Behavioral: Negative for confusion, depression and sleep disturbance. The patient is not nervous/anxious.     PHYSICAL EXAMINATION:  Blood pressure (!) 160/99, pulse 87, temperature 98.1 F (36.7 C), temperature source Oral, resp. rate 20, height 5\' 8"  (1.727 m), weight 156 lb 4.8 oz (70.9 kg), SpO2 95 %.  ECOG PERFORMANCE STATUS: 1 - Symptomatic but completely ambulatory  Physical Exam  Constitutional: Oriented to person, place, and time and well-developed, well-nourished, and in no distress.  HENT:  Head: Normocephalic and atraumatic.  Mouth/Throat: Oropharynx is clear and moist. No oropharyngeal exudate.  Eyes: Conjunctivae are normal. Right eye exhibits no discharge. Left eye exhibits no discharge. No scleral icterus.  Neck: Normal range of motion. Neck supple.  Cardiovascular: Normal rate, regular rhythm, normal heart sounds and intact distal pulses.   Pulmonary/Chest: Effort normal and breath sounds normal. No  respiratory distress. No wheezes. No rales.  Abdominal: Soft. Bowel sounds are normal. Exhibits no distension and no mass. There is no tenderness.  Musculoskeletal: Baseline bilateral lower extremity edema. Normal range of motion.  Lymphadenopathy:    No cervical adenopathy.  Neurological: Alert and oriented to person, place, and time. Exhibits normal muscle tone. Gait normal. Coordination normal.  Skin: Skin is warm and dry. No rash noted. Not diaphoretic. No erythema. No pallor.  Psychiatric: Mood, memory and judgment normal.  Vitals reviewed.  LABORATORY DATA: Lab Results  Component Value Date   WBC 4.6 07/06/2018   HGB 12.1 07/06/2018   HCT 38.1 07/06/2018   MCV 91.4 07/06/2018   PLT 125 (L) 07/06/2018      Chemistry      Component Value Date/Time   NA 144 07/06/2018 1120   K 3.9 07/06/2018 1120   CL 110 07/06/2018 1120    CO2 25 07/06/2018 1120   BUN 19 07/06/2018 1120   CREATININE 1.41 (H) 07/06/2018 1120      Component Value Date/Time   CALCIUM 9.3 07/06/2018 1120   ALKPHOS 60 07/06/2018 1120   AST 20 07/06/2018 1120   ALT 10 07/06/2018 1120   BILITOT 0.6 07/06/2018 1120       RADIOGRAPHIC STUDIES:  Ir Imaging Guided Port Insertion  Result Date: 07/05/2018 CLINICAL DATA:  Lung carcinoma, needs durable venous access for planned chemotherapy regimen EXAM: TUNNELED PORT CATHETER PLACEMENT WITH ULTRASOUND AND FLUOROSCOPIC GUIDANCE FLUOROSCOPY TIME:  6 seconds; 1 mGy ANESTHESIA/SEDATION: Intravenous Fentanyl 43mcg and Versed 2mg  were administered as conscious sedation during continuous monitoring of the patient's level of consciousness and physiological / cardiorespiratory status by the radiology RN, with a total moderate sedation time of 14 minutes. TECHNIQUE: The procedure, risks, benefits, and alternatives were explained to the patient and granddaughter. Questions regarding the procedure were encouraged and answered. The patient understands and consents to the procedure. As antibiotic prophylaxis, cefazolin 2 g was ordered pre-procedure and administered intravenously within one hour of incision. Patency of the right IJ vein was confirmed with ultrasound with image documentation. An appropriate skin site was determined. Skin site was marked. Region was prepped using maximum barrier technique including cap and mask, sterile gown, sterile gloves, large sterile sheet, and Chlorhexidine as cutaneous antisepsis. The region was infiltrated locally with 1% lidocaine. Under real-time ultrasound guidance, the right IJ vein was accessed with a 21 gauge micropuncture needle; the needle tip within the vein was confirmed with ultrasound image documentation. Needle was exchanged over a 018 guidewire for transitional dilator which allowed passage of the [facility] wire into the IVC. Over this, the transitional dilator was exchanged for  a 5 [place] MPA catheter. A small incision was made on the right anterior chest wall and a subcutaneous pocket fashioned. The power-injectable port was positioned and its catheter tunneled to the right IJ dermatotomy site. The MPA catheter was exchanged over an Amplatz wire for a peel-away sheath, through which the port catheter, which had been trimmed to the appropriate length, was advanced and positioned under fluoroscopy with its tip at the cavoatrial junction. Spot chest radiograph confirms good catheter position and no pneumothorax. The pocket was closed with deep interrupted and subcuticular continuous 3-0 Monocryl sutures. The port was flushed per protocol. The incisions were covered with Dermabond then covered with a sterile dressing. COMPLICATIONS: COMPLICATIONS None immediate IMPRESSION: Technically successful right IJ power-injectable port catheter placement. Ready for routine use. Electronically Signed   By: Eden Emms.D.  On: 07/05/2018 13:40     ASSESSMENT/PLAN:  This is a very pleasant 85 year old Caucasian female with stage IIIb non-small cell lung cancer, squamous cell carcinoma. She presented with large right middle lobe lung mass in addition to right hilar, subcarinal, and right supraclavicular lymphadenopathy. She was diagnosed in August 2019.  She completed a course of concurrent chemoradiation with carboplatin and paclitaxel.  She is status post 6 cycles with a partial response.  She is currently undergoing consolidation immunotherapy with Imfinzi 10 mg/kg IV every 2 weeks.  She is status post 9 cycles.  She is tolerating treatment well without any adverse effects.   The patient was seen with Dr. Julien Nordmann today.  Labs were reviewed with the patient.  I recommend she proceed with cycle #10 today as scheduled.  I will see the patient back for follow-up visit in 2 weeks for evaluation before starting cycle #11.   The patient recently had a Port-A-Cath placed.  A prescription of EMLA  cream is at her pharmacy.   For the patient's hypertension, the patient states she has not taken her blood pressure medication today.  She was advised to take her blood pressure medicine as prescribed for her future appointments.   The patient was advised to call immediately if she has any concerning symptoms in the interval. The patient voices understanding of current disease status and treatment options and is in agreement with the current care plan. All questions were answered. The patient knows to call the clinic with any problems, questions or concerns. We can certainly see the patient much sooner if necessary   No orders of the defined types were placed in this encounter.    Cassandra L Heilingoetter, PA-C 07/06/18  ADDENDUM: Hematology/Oncology Attending: I had a face-to-face encounter with the patient today.  I recommended her care plan.  This is a very pleasant 85 years old white female with a stage IIIb non-small cell lung cancer, squamous cell carcinoma status post induction concurrent chemoradiation with partial response and she is currently undergoing consolidation treatment with immunotherapy status post 9 cycles.  She has been tolerating this treatment well with no concerning complaints.  She had a Port-A-Cath placed recently. I recommended for the patient to continue her current treatment with Imfinzi and she will proceed with cycle #10 today. We will see her back for follow-up visit in 2 weeks for evaluation before the next cycle of her treatment. The patient was advised to call immediately if she has any concerning symptoms in the interval.  Disclaimer: This note was dictated with voice recognition software. Similar sounding words can inadvertently be transcribed and may be missed upon review. Eilleen Kempf, MD 07/06/18

## 2018-07-06 NOTE — Patient Instructions (Signed)

## 2018-07-06 NOTE — Progress Notes (Signed)
Pt had port-a-cath placed yesterday. Pt's port was left accessed from yesterday's procedure. When removing opsite dressing, incision came apart slightly. Sandi Mealy PA notified. Steri strips placed on incision by V. Tanner.

## 2018-07-07 ENCOUNTER — Telehealth: Payer: Self-pay | Admitting: Physician Assistant

## 2018-07-07 NOTE — Progress Notes (Signed)
Right chest wall port-a-cath placed yesterday.  The port was left accessed after the procedure yesterday.  When the Opsite dressing was removed and the incision came apart slightly.  Steri-Strips were placed over the site.  There was no erythema, exudate, or bleeding at the site of the port.   Sandi Mealy, MHS, PA-C Physician Assistant

## 2018-07-07 NOTE — Telephone Encounter (Signed)
Called regarding additions told to pick up calendar

## 2018-07-08 ENCOUNTER — Telehealth: Payer: Self-pay | Admitting: Internal Medicine

## 2018-07-08 NOTE — Telephone Encounter (Signed)
Moved appts per MD request.  Patient aware of appt date and time.

## 2018-07-18 ENCOUNTER — Telehealth: Payer: Self-pay | Admitting: Internal Medicine

## 2018-07-18 NOTE — Telephone Encounter (Signed)
Moved 4/23 appointments from PM to AM. Spoke with patient.

## 2018-07-20 ENCOUNTER — Ambulatory Visit: Payer: Medicare Other | Admitting: Internal Medicine

## 2018-07-20 ENCOUNTER — Other Ambulatory Visit: Payer: Medicare Other

## 2018-07-20 ENCOUNTER — Ambulatory Visit: Payer: Medicare Other

## 2018-07-21 ENCOUNTER — Inpatient Hospital Stay (HOSPITAL_BASED_OUTPATIENT_CLINIC_OR_DEPARTMENT_OTHER): Payer: Medicare Other | Admitting: Internal Medicine

## 2018-07-21 ENCOUNTER — Other Ambulatory Visit: Payer: Medicare Other

## 2018-07-21 ENCOUNTER — Inpatient Hospital Stay: Payer: Medicare Other

## 2018-07-21 ENCOUNTER — Encounter: Payer: Self-pay | Admitting: Internal Medicine

## 2018-07-21 ENCOUNTER — Other Ambulatory Visit: Payer: Self-pay

## 2018-07-21 VITALS — BP 152/90

## 2018-07-21 VITALS — BP 153/98 | HR 92 | Temp 98.0°F | Resp 20 | Ht 68.0 in | Wt 155.5 lb

## 2018-07-21 DIAGNOSIS — E039 Hypothyroidism, unspecified: Secondary | ICD-10-CM | POA: Diagnosis not present

## 2018-07-21 DIAGNOSIS — Z7901 Long term (current) use of anticoagulants: Secondary | ICD-10-CM

## 2018-07-21 DIAGNOSIS — I10 Essential (primary) hypertension: Secondary | ICD-10-CM

## 2018-07-21 DIAGNOSIS — C770 Secondary and unspecified malignant neoplasm of lymph nodes of head, face and neck: Secondary | ICD-10-CM

## 2018-07-21 DIAGNOSIS — E785 Hyperlipidemia, unspecified: Secondary | ICD-10-CM

## 2018-07-21 DIAGNOSIS — Z85820 Personal history of malignant melanoma of skin: Secondary | ICD-10-CM | POA: Diagnosis not present

## 2018-07-21 DIAGNOSIS — Z79899 Other long term (current) drug therapy: Secondary | ICD-10-CM

## 2018-07-21 DIAGNOSIS — C342 Malignant neoplasm of middle lobe, bronchus or lung: Secondary | ICD-10-CM | POA: Diagnosis not present

## 2018-07-21 DIAGNOSIS — Z5112 Encounter for antineoplastic immunotherapy: Secondary | ICD-10-CM | POA: Diagnosis not present

## 2018-07-21 DIAGNOSIS — C3491 Malignant neoplasm of unspecified part of right bronchus or lung: Secondary | ICD-10-CM

## 2018-07-21 LAB — CBC WITH DIFFERENTIAL (CANCER CENTER ONLY)
Abs Immature Granulocytes: 0.01 10*3/uL (ref 0.00–0.07)
Basophils Absolute: 0.1 10*3/uL (ref 0.0–0.1)
Basophils Relative: 1 %
Eosinophils Absolute: 0.1 10*3/uL (ref 0.0–0.5)
Eosinophils Relative: 1 %
HCT: 41.5 % (ref 36.0–46.0)
Hemoglobin: 12.8 g/dL (ref 12.0–15.0)
Immature Granulocytes: 0 %
Lymphocytes Relative: 23 %
Lymphs Abs: 1.4 10*3/uL (ref 0.7–4.0)
MCH: 28.6 pg (ref 26.0–34.0)
MCHC: 30.8 g/dL (ref 30.0–36.0)
MCV: 92.8 fL (ref 80.0–100.0)
Monocytes Absolute: 0.5 10*3/uL (ref 0.1–1.0)
Monocytes Relative: 8 %
Neutro Abs: 3.9 10*3/uL (ref 1.7–7.7)
Neutrophils Relative %: 67 %
Platelet Count: 139 10*3/uL — ABNORMAL LOW (ref 150–400)
RBC: 4.47 MIL/uL (ref 3.87–5.11)
RDW: 14.8 % (ref 11.5–15.5)
WBC Count: 5.9 10*3/uL (ref 4.0–10.5)
nRBC: 0 % (ref 0.0–0.2)

## 2018-07-21 LAB — CMP (CANCER CENTER ONLY)
ALT: 9 U/L (ref 0–44)
AST: 21 U/L (ref 15–41)
Albumin: 3.7 g/dL (ref 3.5–5.0)
Alkaline Phosphatase: 79 U/L (ref 38–126)
Anion gap: 12 (ref 5–15)
BUN: 26 mg/dL — ABNORMAL HIGH (ref 8–23)
CO2: 25 mmol/L (ref 22–32)
Calcium: 9.3 mg/dL (ref 8.9–10.3)
Chloride: 108 mmol/L (ref 98–111)
Creatinine: 1.63 mg/dL — ABNORMAL HIGH (ref 0.44–1.00)
GFR, Est AFR Am: 33 mL/min — ABNORMAL LOW (ref >60)
GFR, Estimated: 29 mL/min — ABNORMAL LOW (ref >60)
Glucose, Bld: 96 mg/dL (ref 70–99)
Potassium: 4.1 mmol/L (ref 3.5–5.1)
Sodium: 145 mmol/L (ref 135–145)
Total Bilirubin: 0.4 mg/dL (ref 0.3–1.2)
Total Protein: 7.1 g/dL (ref 6.5–8.1)

## 2018-07-21 MED ORDER — HEPARIN SOD (PORK) LOCK FLUSH 100 UNIT/ML IV SOLN
500.0000 [IU] | Freq: Once | INTRAVENOUS | Status: AC | PRN
  Administered 2018-07-21: 500 [IU]
  Filled 2018-07-21: qty 5

## 2018-07-21 MED ORDER — SODIUM CHLORIDE 0.9 % IV SOLN
Freq: Once | INTRAVENOUS | Status: AC
  Administered 2018-07-21: 15:00:00 via INTRAVENOUS
  Filled 2018-07-21: qty 250

## 2018-07-21 MED ORDER — SODIUM CHLORIDE 0.9 % IV SOLN
10.0000 mg/kg | Freq: Once | INTRAVENOUS | Status: AC
  Administered 2018-07-21: 740 mg via INTRAVENOUS
  Filled 2018-07-21: qty 4.8

## 2018-07-21 MED ORDER — SODIUM CHLORIDE 0.9% FLUSH
10.0000 mL | INTRAVENOUS | Status: DC | PRN
  Administered 2018-07-21: 10 mL
  Filled 2018-07-21: qty 10

## 2018-07-21 NOTE — Progress Notes (Signed)
[place] [phone]   [phone]  OFFICE PROGRESS NOTE  [name], MD [place] [address]  DIAGNOSIS: Stage IIIB (T3, N3, M0) non-small cell lung cancer, squamous cell carcinoma presented with large right middle lobe lung mass in addition to right hilar, subcarinal and right supraclavicular lymphadenopathy diagnosed in August 2019.  PRIOR THERAPY: Concurrent chemoradiation with weekly carboplatin for AUC of 2 and paclitaxel 45 NG/M2.  Status post 6 cycles with partial response.   CURRENT THERAPY: Consolidation immunotherapy with Imfinzi 10 mg/KG every 2 weeks started February 28, 2018.  Status post 10 cycles.  INTERVAL HISTORY: Lisa [name] 85 y.o. female returns to the clinic today for follow-up visit.  The patient is feeling fine today with no concerning complaints.  She continues to tolerate her treatment with Imfinzi fairly well.  She denied having any chest pain, shortness of breath, cough or hemoptysis.  She denied having any recent weight loss or night sweats.  She has no nausea, vomiting, diarrhea or constipation.  She is here today for evaluation before starting cycle #11.  MEDICAL HISTORY: Past Medical History:  Diagnosis Date   Diverticulosis    Headache    Hemorrhoids    internal and external   Hyperlipidemia 01/29/1991   Hypertension 10/29/1995   Hypothyroidism 10/29/1995   Lung mass    right middle lobe   Melanoma ([place])    h/o, local excision. no chemo or rady.    Skin cancer (melanoma) ([place])    Tubular adenoma of colon    Wears dentures     ALLERGIES:  is allergic to atorvastatin; celebrex [celecoxib]; cholecalciferol; and simvastatin.  MEDICATIONS:  Current Outpatient Medications  Medication Sig Dispense Refill   amLODipine (NORVASC) 10 MG tablet Take 1 tablet (10 mg total) by mouth daily. 90 tablet 3   Cholecalciferol (VITAMIN D) 2000 units tablet Take 2,000 Units by  mouth 2 (two) times daily.     Coenzyme Q10 100 MG capsule Take 100 mg by mouth daily.      docusate sodium (COLACE) 100 MG capsule Take 100 mg by mouth daily as needed for mild constipation.     fluticasone (FLONASE) 50 MCG/ACT nasal spray USE 2 SPRAYS IN EACH NOSTRIL DAILY AS NEEDED FOR RHINITIS (Patient not taking: Reported on 05/11/2018) 48 g 3   levothyroxine (SYNTHROID, LEVOTHROID) 75 MCG tablet Take 1 tablet by mouth once daily 90 tablet 3   lidocaine-prilocaine (EMLA) cream Apply 1 application topically as needed. 30 g 0   mirtazapine (REMERON) 7.5 MG tablet Take 1 tablet (7.5 mg total) by mouth at bedtime. 30 tablet 5   potassium chloride SA (K-DUR,KLOR-CON) 20 MEQ tablet Take 1 tablet (20 mEq total) by mouth 2 (two) times daily. 30 tablet 0   prochlorperazine (COMPAZINE) 10 MG tablet Take 1 tablet (10 mg total) by mouth every 6 (six) hours as needed for nausea or vomiting. (Patient not taking: Reported on 05/11/2018) 30 tablet 0   rivaroxaban (XARELTO) 20 MG TABS tablet Take 1 tablet (20 mg total) by mouth daily with supper. (Patient not taking: Reported on 05/11/2018) 30 tablet 1   Rivaroxaban 15 & 20 MG TBPK Take as directed on package: Start with one 15mg  tablet by mouth twice a day with food. On Day 22, switch to one 20mg  tablet once a day with food. 51 each 0   sennosides-docusate sodium (SENOKOT-S) 8.6-50 MG tablet Take 1 tablet by mouth daily  as needed for constipation.     No current facility-administered medications for this visit.    Facility-Administered Medications Ordered in Other Visits  Medication Dose Route Frequency Provider Last Rate Last Dose   acetaminophen (TYLENOL) tablet 650 mg  650 mg Oral Once Nicholas Lose, MD        SURGICAL HISTORY:  Past Surgical History:  Procedure Laterality Date   BRONCHIAL BIOPSY  11/23/2017   Procedure: BRONCHIAL BIOPSIES;  Surgeon: Grace Isaac, MD;  Location: [facility] OR;  Service: Thoracic;;   cataract surgery  2007,  2008   repair bilat   DG BARIUM ENEMA ([place] HX) N/A 2016   [place]   HERNIA REPAIR  04/25/2001   IR IMAGING GUIDED PORT INSERTION  07/05/2018   IR US GUIDE BX ASP/DRAIN  11/17/2017   KNEE SURGERY     meniscus tear per Dr. Ronnie Derby, R knee   MULTIPLE TOOTH EXTRACTIONS     SEPTOPLASTY  2006   Dr. Ernesto Rutherford   SKIN CANCER EXCISION     TONSILLECTOMY  1954   VAGINAL HYSTERECTOMY     VIDEO BRONCHOSCOPY WITH ENDOBRONCHIAL ULTRASOUND N/A 11/23/2017   Procedure: VIDEO BRONCHOSCOPY WITH ENDOBRONCHIAL ULTRASOUND;  Surgeon: Grace Isaac, MD;  Location: [facility] OR;  Service: Thoracic;  Laterality: N/A;    REVIEW OF SYSTEMS:  A comprehensive review of systems was negative.   PHYSICAL EXAMINATION: General appearance: alert, cooperative, fatigued and no distress Head: Normocephalic, without obvious abnormality, atraumatic Neck: no adenopathy, no JVD, supple, symmetrical, trachea midline and thyroid not enlarged, symmetric, no tenderness/mass/nodules Lymph nodes: Cervical, supraclavicular, and axillary nodes normal. Resp: clear to auscultation bilaterally Back: symmetric, no curvature. ROM normal. No CVA tenderness. Cardio: regular rate and rhythm, S1, S2 normal, no murmur, click, rub or gallop GI: soft, non-tender; bowel sounds normal; no masses,  no organomegaly Extremities: extremities normal, atraumatic, no cyanosis or edema  ECOG PERFORMANCE STATUS: 1 - Symptomatic but completely ambulatory  Blood pressure (!) 153/98, pulse 92, temperature 98 F (36.7 C), temperature source Oral, resp. rate 20, height 5\' 8"  (1.727 m), weight 155 lb 8 oz (70.5 kg), SpO2 97 %.  LABORATORY DATA: Lab Results  Component Value Date   WBC 5.9 07/21/2018   HGB 12.8 07/21/2018   HCT 41.5 07/21/2018   MCV 92.8 07/21/2018   PLT 139 (L) 07/21/2018      Chemistry      Component Value Date/Time   NA 144 07/06/2018 1120   K 3.9 07/06/2018 1120   CL 110 07/06/2018 1120   CO2 25 07/06/2018 1120   BUN  19 07/06/2018 1120   CREATININE 1.41 (H) 07/06/2018 1120      Component Value Date/Time   CALCIUM 9.3 07/06/2018 1120   ALKPHOS 60 07/06/2018 1120   AST 20 07/06/2018 1120   ALT 10 07/06/2018 1120   BILITOT 0.6 07/06/2018 1120       RADIOGRAPHIC STUDIES: Ir Imaging Guided Port Insertion  Result Date: 07/05/2018 CLINICAL DATA:  Lung carcinoma, needs durable venous access for planned chemotherapy regimen EXAM: TUNNELED PORT CATHETER PLACEMENT WITH ULTRASOUND AND FLUOROSCOPIC GUIDANCE FLUOROSCOPY TIME:  6 seconds; 1 mGy ANESTHESIA/SEDATION: Intravenous Fentanyl 25mcg and Versed 2mg  were administered as conscious sedation during continuous monitoring of the patient's level of consciousness and physiological / cardiorespiratory status by the radiology RN, with a total moderate sedation time of 14 minutes. TECHNIQUE: The procedure, risks, benefits, and alternatives were explained to the patient and granddaughter. Questions regarding the procedure were encouraged and answered.  The patient understands and consents to the procedure. As antibiotic prophylaxis, cefazolin 2 g was ordered pre-procedure and administered intravenously within one hour of incision. Patency of the right IJ vein was confirmed with ultrasound with image documentation. An appropriate skin site was determined. Skin site was marked. Region was prepped using maximum barrier technique including cap and mask, sterile gown, sterile gloves, large sterile sheet, and Chlorhexidine as cutaneous antisepsis. The region was infiltrated locally with 1% lidocaine. Under real-time ultrasound guidance, the right IJ vein was accessed with a 21 gauge micropuncture needle; the needle tip within the vein was confirmed with ultrasound image documentation. Needle was exchanged over a 018 guidewire for transitional dilator which allowed passage of the [facility] wire into the IVC. Over this, the transitional dilator was exchanged for a 5 [place] MPA catheter. A small  incision was made on the right anterior chest wall and a subcutaneous pocket fashioned. The power-injectable port was positioned and its catheter tunneled to the right IJ dermatotomy site. The MPA catheter was exchanged over an Amplatz wire for a peel-away sheath, through which the port catheter, which had been trimmed to the appropriate length, was advanced and positioned under fluoroscopy with its tip at the cavoatrial junction. Spot chest radiograph confirms good catheter position and no pneumothorax. The pocket was closed with deep interrupted and subcuticular continuous 3-0 Monocryl sutures. The port was flushed per protocol. The incisions were covered with Dermabond then covered with a sterile dressing. COMPLICATIONS: COMPLICATIONS None immediate IMPRESSION: Technically successful right IJ power-injectable port catheter placement. Ready for routine use. Electronically Signed   By: Lucrezia Europe M.D.   On: 07/05/2018 13:40    ASSESSMENT AND PLAN: This is a very pleasant 85 years old white female with stage IIIb non-small cell lung cancer, squamous cell carcinoma.  She is currently undergoing a course of concurrent chemoradiation with weekly carboplatin and paclitaxel status post 6 cycles.  She tolerated this treatment well with partial response. She is currently undergoing consolidation treatment with immunotherapy with Imfinzi status post 10 cycles.   She has been tolerating this treatment well with no concerning adverse effects. I recommended for the patient to proceed with cycle #11 today as a scheduled. I will see her back for follow-up visit in 2 weeks for evaluation before starting cycle #12. She was advised to call immediately if she has any concerning symptoms in the interval. The patient voices understanding of current disease status and treatment options and is in agreement with the current care plan. All questions were answered. The patient knows to call the clinic with any problems, questions  or concerns. We can certainly see the patient much sooner if necessary.  I spent 10 minutes counseling the patient face to face. The total time spent in the appointment was 15 minutes.  Disclaimer: This note was dictated with voice recognition software. Similar sounding words can inadvertently be transcribed and may not be corrected upon review.

## 2018-07-21 NOTE — Patient Instructions (Addendum)
[facility] Discharge Instructions for Patients Receiving Chemotherapy  Today you received the following chemotherapy agents: Imfinzi.  To help prevent nausea and vomiting after your treatment, we encourage you to take your nausea medication as directed.   If you develop nausea and vomiting that is not controlled by your nausea medication, call the clinic.   BELOW ARE SYMPTOMS THAT SHOULD BE REPORTED IMMEDIATELY:  *FEVER GREATER THAN 100.5 F  *CHILLS WITH OR WITHOUT FEVER  NAUSEA AND VOMITING THAT IS NOT CONTROLLED WITH YOUR NAUSEA MEDICATION  *UNUSUAL SHORTNESS OF BREATH  *UNUSUAL BRUISING OR BLEEDING  TENDERNESS IN MOUTH AND THROAT WITH OR WITHOUT PRESENCE OF ULCERS  *URINARY PROBLEMS  *BOWEL PROBLEMS  UNUSUAL RASH Items with * indicate a potential emergency and should be followed up as soon as possible.  Feel free to call the clinic should you have any questions or concerns. The clinic phone number is [phone].  Please show the CHEMO ALERT CARD at check-in to the Emergency Department and triage nurse.   

## 2018-07-21 NOTE — Progress Notes (Signed)
Per Dr. Julien Nordmann, ok to proceed with chemo today.

## 2018-07-22 ENCOUNTER — Telehealth: Payer: Self-pay | Admitting: Physician Assistant

## 2018-07-22 ENCOUNTER — Other Ambulatory Visit: Payer: Self-pay | Admitting: Oncology

## 2018-07-22 ENCOUNTER — Other Ambulatory Visit: Payer: Self-pay | Admitting: Family Medicine

## 2018-07-22 NOTE — Telephone Encounter (Signed)
Please review. Last office visit was March 2019, she has been going to cancer center a lot. Is it ok to refill her medication?

## 2018-07-22 NOTE — Telephone Encounter (Signed)
Returned patient's phone call regarding cancelling appointments, due to concerns of COVID-19 and patient having a sore throat the patient would like to 05/06 appointments and will call when ready to reschedule.   Message to provider.

## 2018-07-24 NOTE — Telephone Encounter (Addendum)
rx sent. Okay to continue, would schedule web visit when possible.  Thanks.

## 2018-07-25 NOTE — Telephone Encounter (Signed)
Called pt twice both times it seems that the phone is picked up but you can't hear anything but I will try again.

## 2018-07-26 ENCOUNTER — Ambulatory Visit (INDEPENDENT_AMBULATORY_CARE_PROVIDER_SITE_OTHER): Payer: Medicare Other | Admitting: Family Medicine

## 2018-07-26 DIAGNOSIS — I10 Essential (primary) hypertension: Secondary | ICD-10-CM

## 2018-07-26 DIAGNOSIS — C3491 Malignant neoplasm of unspecified part of right bronchus or lung: Secondary | ICD-10-CM

## 2018-07-26 DIAGNOSIS — E039 Hypothyroidism, unspecified: Secondary | ICD-10-CM | POA: Diagnosis not present

## 2018-07-26 DIAGNOSIS — Z0000 Encounter for general adult medical examination without abnormal findings: Secondary | ICD-10-CM

## 2018-07-26 DIAGNOSIS — Z7189 Other specified counseling: Secondary | ICD-10-CM

## 2018-07-26 NOTE — Progress Notes (Signed)
Interactive audio and video telecommunications were attempted between this provider and patient, however failed, due to patient having technical difficulties OR patient did not have access to video capability.  We continued and completed visit with audio only.   Virtual Visit via Telephone Note  I connected with patient on 07/26/18 at 11:54 AM by telephone and verified that I am speaking with the correct person using two identifiers.  Location of patient: home.   Location of MD: [facility] Name of referring provider (if blank then none associated): Names per persons and role in encounter:  MD: Earlyne Iba, Patient: name listed above.    I discussed the limitations, risks, security and privacy concerns of performing an evaluation and management service by telephone and the availability of in person appointments. I also discussed with the patient that there may be a patient responsible charge related to this service. The patient expressed understanding and agreed to proceed.  History of Present Illness:  Flu 2019 Shingles 2008 PNA UTD Tetanus 2011 Colonoscopy not indicated.   Breast cancer screening d/w pt.  Done 2018.  DXA 2019.   Advance directive d/w pt.   Granddaughter EmilySmithdesignated if patient were incapacitated.  H/o lung cancer, IIIb non-small cell lung cancer, squamous cell carcinoma.  Per onc.  D/w pt.  She is anticoagulated per hematology clinic.  I'll defer.  No nausea.  Still with some fatigue but not as much as prior.  She has some occ memory lapses, some days worse than others and dependent on her level of fatigue.    Hypertension:    Using medication without problems or lightheadedness: yes Chest pain with exertion:no Edema:no Short of breath:not SOB with walking.   Average home BPs: I asked her to check her BP at home and update me if consistently >150/>90  Hypothyroidism.  Still on tx at baseline.  No dysphagia.  No neck mass noted by patient.   Compliant.  TSH wnl recently.  D/w pt.    ST attributed to post nasal gtt per patient. No fevers.  Discussed restarting flonase to see if that helps.      Observations/Objective: nad Speech wnl  Assessment and Plan:  Flu 2019 Shingles 2008 PNA UTD Tetanus 2011 Colonoscopy not indicated.   Breast cancer screening d/w pt.  Done 2018.  DXA 2019.   Advance directive d/w pt.   Granddaughter EmilySmithdesignated if patient were incapacitated.  H/o lung cancer, IIIb non-small cell lung cancer, squamous cell carcinoma.  Per onc.  D/w pt.  She is anticoagulated per hematology clinic.  I'll defer.  No nausea.  Still with some fatigue but not as much as prior.  She has some occ memory lapses, some days worse than others and dependent on her level of fatigue.    Hypertension:  No change in meds at this point.  I asked her to check her BP at home and update me if consistently >150/>90  Hypothyroidism.  Still on tx at baseline.  No dysphagia.  No neck mass noted by patient.  Compliant.  TSH wnl recently.  D/w pt.    ST attributed to post nasal gtt per patient. No fevers.  Discussed restarting flonase to see if that helps.  She will update me as needed.  Follow Up Instructions:as above.    I discussed the assessment and treatment plan with the patient. The patient was provided an opportunity to ask questions and all were answered. The patient agreed with the plan and demonstrated an understanding  of the instructions.   The patient was advised to call back or seek an in-person evaluation if the symptoms worsen or if the condition fails to improve as anticipated.  I provided 30 minutes of non-face-to-face time during this encounter.  Elsie Stain, MD

## 2018-07-27 ENCOUNTER — Telehealth: Payer: Self-pay | Admitting: Family Medicine

## 2018-07-27 DIAGNOSIS — Z0000 Encounter for general adult medical examination without abnormal findings: Secondary | ICD-10-CM | POA: Insufficient documentation

## 2018-07-27 MED ORDER — RIVAROXABAN 20 MG PO TABS: 20.0000 mg | ORAL_TABLET | Freq: Every day | ORAL | 1 refills | Status: DC

## 2018-07-27 NOTE — Assessment & Plan Note (Signed)
Flu 2019 Shingles 2008 PNA UTD Tetanus 2011 Colonoscopy not indicated.   Breast cancer screening d/w pt.  Done 2018.  DXA 2019.   Advance directive d/w pt.   Granddaughter EmilySmithdesignated if patient were incapacitated.

## 2018-07-27 NOTE — Telephone Encounter (Signed)
Sent.  When I talked to patient about this, she told me hematology was addressing this.  I sent the rx today in the meantime.  Thanks.

## 2018-07-27 NOTE — Assessment & Plan Note (Signed)
H/o lung cancer, IIIb non-small cell lung cancer, squamous cell carcinoma.  Per onc.  D/w pt.  She is anticoagulated per hematology clinic.  I'll defer.  No nausea.  Still with some fatigue but not as much as prior.  She has some occ memory lapses, some days worse than others and dependent on her level of fatigue.  At this point, I will defer and patient agrees.

## 2018-07-27 NOTE — Telephone Encounter (Signed)
Last office visit 07/26/18 Last refill 04/28/18 #30/1

## 2018-07-27 NOTE — Assessment & Plan Note (Signed)
No change in meds at this point.  I asked her to check her BP at home and update me if consistently >150/>90

## 2018-07-27 NOTE — Assessment & Plan Note (Signed)
Still on tx at baseline.  No dysphagia.  No neck mass noted by patient.  Compliant.  TSH wnl recently.  D/w pt.

## 2018-07-27 NOTE — Telephone Encounter (Signed)
Best number [phone]  [facility] called pt has been out of her  xarelto for 1 week   [facility] [address]

## 2018-07-27 NOTE — Assessment & Plan Note (Signed)
  Advance directive d/w pt.   Granddaughter EmilySmithdesignated if patient were incapacitated.

## 2018-07-28 NOTE — Telephone Encounter (Signed)
Dewaine Conger informed Family Medicine she is out of medicine on F/U visit Tuesday, 07-26-2018.    Xarelto refill order sent.  See Family Medicine notes.

## 2018-08-02 ENCOUNTER — Telehealth: Payer: Self-pay | Admitting: Medical Oncology

## 2018-08-02 ENCOUNTER — Telehealth: Payer: Self-pay

## 2018-08-02 ENCOUNTER — Encounter: Payer: Self-pay | Admitting: Family Medicine

## 2018-08-02 ENCOUNTER — Other Ambulatory Visit: Payer: Self-pay

## 2018-08-02 ENCOUNTER — Ambulatory Visit (INDEPENDENT_AMBULATORY_CARE_PROVIDER_SITE_OTHER): Payer: Medicare Other | Admitting: Family Medicine

## 2018-08-02 DIAGNOSIS — J029 Acute pharyngitis, unspecified: Secondary | ICD-10-CM

## 2018-08-02 MED ORDER — FLUTICASONE PROPIONATE 50 MCG/ACT NA SUSP: NASAL | 3 refills | Status: AC

## 2018-08-02 MED ORDER — LORATADINE 10 MG PO TABS: 10.0000 mg | ORAL_TABLET | Freq: Every day | ORAL | Status: AC | PRN

## 2018-08-02 NOTE — Telephone Encounter (Signed)
Noted. Thanks.

## 2018-08-02 NOTE — Telephone Encounter (Signed)
Sore throat- since last  Imfinzi on 4/23.PEr Julien Nordmann she  Needs to see PCP or Urgent care provider for sore throat. Grandaughter notified. She will call me back.

## 2018-08-02 NOTE — Progress Notes (Signed)
Interactive audio and video telecommunications were attempted between this provider and patient, however failed, due to patient having technical difficulties OR patient did not have access to video capability.  We continued and completed visit with audio only.   Virtual Visit via Telephone Note  I connected with patient on 08/02/18 at 2:40 PM by telephone and verified that I am speaking with the correct person using two identifiers.  Location of patient: home  Location of MD: [place] Name of referring provider (if blank then none associated): Names per persons and role in encounter:  MD: Earlyne Iba, Patient: name listed above.    I discussed the limitations, risks, security and privacy concerns of performing an evaluation and management service by telephone and the availability of in person appointments. I also discussed with the patient that there may be a patient responsible charge related to this service. The patient expressed understanding and agreed to proceed.  CC: ST.  History of Present Illness:    She has noted ST since last port intervention.  The muscles around the area are sore since then, a few weeks ago.    She restarted flonase since last OV, clearly with some relief.  Still with some rhinorrhea, clear.  Higher pollen counts recent noted.  She usually has this kind of sx in the spring of the year.  No fevers.  No vomiting, no diarrhea.  No ear pain.  No facial pain.  No sick contacts.  Minimal sputum.  No wheeze.  She is clearly better since the start of flonase.    She is due for f/u at oncology on 07/2018.    Meds and allergies reviewed.   ROS: Per HPI unless specifically indicated in ROS section   Observations/Objective:nad Speech at baseline.   Assessment and Plan: ST, likely from seasonal allergies.  Can continue flonase and add on claritin but not claritin D.  She agrees.    This doesn't sound like an ominous situation.  It should be okay to f/u with  oncology, assuming she didn't have a fever or other new sx.    Follow Up Instructions:as above.     I discussed the assessment and treatment plan with the patient. The patient was provided an opportunity to ask questions and all were answered. The patient agreed with the plan and demonstrated an understanding of the instructions.   The patient was advised to call back or seek an in-person evaluation if the symptoms worsen or if the condition fails to improve as anticipated.  I provided 15 minutes of non-face-to-face time during this encounter.  Elsie Stain, MD

## 2018-08-02 NOTE — Telephone Encounter (Signed)
Ripley Fraise daughter called back to schedule pt phone visit today 5/5 @ 2:30 Pt spouse has appointment @ 3 also

## 2018-08-02 NOTE — Telephone Encounter (Signed)
Per Sandi Mealy , I LVM to have pt see her PCP today for virtual visit.

## 2018-08-02 NOTE — Telephone Encounter (Signed)
[name] (DPR signed) said that pt has an appt with oncology for a treatment and oncology advised [name] pt needs to be seen for S/T prior to oncology appt. [name] said pt has no fever and no other symptoms. [name] will contact oncology to verify a virtual visit will be acceptable and will cb if needs to schedule virtual appt otherwise will take pt to UC. FYI to Dr Damita Dunnings.

## 2018-08-03 ENCOUNTER — Other Ambulatory Visit: Payer: Medicare Other

## 2018-08-03 ENCOUNTER — Telehealth: Payer: Self-pay | Admitting: Physician Assistant

## 2018-08-03 ENCOUNTER — Ambulatory Visit: Payer: Medicare Other

## 2018-08-03 ENCOUNTER — Ambulatory Visit: Payer: Medicare Other | Admitting: Physician Assistant

## 2018-08-03 DIAGNOSIS — J029 Acute pharyngitis, unspecified: Secondary | ICD-10-CM | POA: Insufficient documentation

## 2018-08-03 NOTE — Telephone Encounter (Signed)
Scheduled appt per 5/05 sch message - unable to reach patient. Left message for patients granddaughter with appt date and time

## 2018-08-03 NOTE — Assessment & Plan Note (Signed)
ST, likely from seasonal allergies.  Can continue flonase and add on claritin but not claritin D.  She agrees.    This doesn't sound like an ominous situation.  It should be okay to f/u with oncology, assuming she didn't have a fever or other new sx.

## 2018-08-11 ENCOUNTER — Inpatient Hospital Stay (HOSPITAL_BASED_OUTPATIENT_CLINIC_OR_DEPARTMENT_OTHER): Payer: Medicare Other | Admitting: Physician Assistant

## 2018-08-11 ENCOUNTER — Other Ambulatory Visit: Payer: Self-pay

## 2018-08-11 ENCOUNTER — Inpatient Hospital Stay: Payer: Medicare Other

## 2018-08-11 ENCOUNTER — Inpatient Hospital Stay: Payer: Medicare Other | Attending: Internal Medicine

## 2018-08-11 VITALS — BP 144/88 | HR 99 | Temp 99.1°F | Resp 20 | Ht 68.0 in | Wt 151.8 lb

## 2018-08-11 DIAGNOSIS — C770 Secondary and unspecified malignant neoplasm of lymph nodes of head, face and neck: Secondary | ICD-10-CM | POA: Diagnosis not present

## 2018-08-11 DIAGNOSIS — Z5112 Encounter for antineoplastic immunotherapy: Secondary | ICD-10-CM | POA: Diagnosis not present

## 2018-08-11 DIAGNOSIS — C3491 Malignant neoplasm of unspecified part of right bronchus or lung: Secondary | ICD-10-CM

## 2018-08-11 DIAGNOSIS — Z79899 Other long term (current) drug therapy: Secondary | ICD-10-CM | POA: Diagnosis not present

## 2018-08-11 DIAGNOSIS — I10 Essential (primary) hypertension: Secondary | ICD-10-CM | POA: Insufficient documentation

## 2018-08-11 DIAGNOSIS — C342 Malignant neoplasm of middle lobe, bronchus or lung: Secondary | ICD-10-CM | POA: Diagnosis not present

## 2018-08-11 DIAGNOSIS — E785 Hyperlipidemia, unspecified: Secondary | ICD-10-CM

## 2018-08-11 DIAGNOSIS — Z85820 Personal history of malignant melanoma of skin: Secondary | ICD-10-CM | POA: Insufficient documentation

## 2018-08-11 DIAGNOSIS — E039 Hypothyroidism, unspecified: Secondary | ICD-10-CM | POA: Insufficient documentation

## 2018-08-11 DIAGNOSIS — Z95828 Presence of other vascular implants and grafts: Secondary | ICD-10-CM

## 2018-08-11 DIAGNOSIS — Z7901 Long term (current) use of anticoagulants: Secondary | ICD-10-CM | POA: Insufficient documentation

## 2018-08-11 DIAGNOSIS — R590 Localized enlarged lymph nodes: Secondary | ICD-10-CM | POA: Diagnosis not present

## 2018-08-11 DIAGNOSIS — Z7951 Long term (current) use of inhaled steroids: Secondary | ICD-10-CM | POA: Diagnosis not present

## 2018-08-11 LAB — CMP (CANCER CENTER ONLY)
ALT: 13 U/L (ref 0–44)
AST: 20 U/L (ref 15–41)
Albumin: 3.6 g/dL (ref 3.5–5.0)
Alkaline Phosphatase: 78 U/L (ref 38–126)
Anion gap: 7 (ref 5–15)
BUN: 28 mg/dL — ABNORMAL HIGH (ref 8–23)
CO2: 27 mmol/L (ref 22–32)
Calcium: 9.7 mg/dL (ref 8.9–10.3)
Chloride: 108 mmol/L (ref 98–111)
Creatinine: 1.55 mg/dL — ABNORMAL HIGH (ref 0.44–1.00)
GFR, Est AFR Am: 35 mL/min — ABNORMAL LOW (ref >60)
GFR, Estimated: 30 mL/min — ABNORMAL LOW (ref >60)
Glucose, Bld: 106 mg/dL — ABNORMAL HIGH (ref 70–99)
Potassium: 3.8 mmol/L (ref 3.5–5.1)
Sodium: 142 mmol/L (ref 135–145)
Total Bilirubin: 0.3 mg/dL (ref 0.3–1.2)
Total Protein: 6.9 g/dL (ref 6.5–8.1)

## 2018-08-11 LAB — CBC WITH DIFFERENTIAL (CANCER CENTER ONLY)
Abs Immature Granulocytes: 0.01 10*3/uL (ref 0.00–0.07)
Basophils Absolute: 0 10*3/uL (ref 0.0–0.1)
Basophils Relative: 1 %
Eosinophils Absolute: 0.1 10*3/uL (ref 0.0–0.5)
Eosinophils Relative: 2 %
HCT: 38.7 % (ref 36.0–46.0)
Hemoglobin: 12.2 g/dL (ref 12.0–15.0)
Immature Granulocytes: 0 %
Lymphocytes Relative: 25 %
Lymphs Abs: 1.3 10*3/uL (ref 0.7–4.0)
MCH: 29.5 pg (ref 26.0–34.0)
MCHC: 31.5 g/dL (ref 30.0–36.0)
MCV: 93.7 fL (ref 80.0–100.0)
Monocytes Absolute: 0.4 10*3/uL (ref 0.1–1.0)
Monocytes Relative: 7 %
Neutro Abs: 3.5 10*3/uL (ref 1.7–7.7)
Neutrophils Relative %: 65 %
Platelet Count: 164 10*3/uL (ref 150–400)
RBC: 4.13 MIL/uL (ref 3.87–5.11)
RDW: 14.3 % (ref 11.5–15.5)
WBC Count: 5.3 10*3/uL (ref 4.0–10.5)
nRBC: 0 % (ref 0.0–0.2)

## 2018-08-11 LAB — TSH: TSH: 2.282 u[IU]/mL (ref 0.308–3.960)

## 2018-08-11 MED ORDER — LIDOCAINE-PRILOCAINE 2.5-2.5 % EX CREA: 1.0000 "application " | TOPICAL_CREAM | CUTANEOUS | 0 refills | Status: DC | PRN

## 2018-08-11 MED ORDER — SODIUM CHLORIDE 0.9 % IV SOLN
10.0000 mg/kg | Freq: Once | INTRAVENOUS | Status: AC
  Administered 2018-08-11: 740 mg via INTRAVENOUS
  Filled 2018-08-11: qty 4.8

## 2018-08-11 MED ORDER — SODIUM CHLORIDE 0.9 % IV SOLN
Freq: Once | INTRAVENOUS | Status: AC
  Administered 2018-08-11: 14:00:00 via INTRAVENOUS
  Filled 2018-08-11: qty 250

## 2018-08-11 MED ORDER — HEPARIN SOD (PORK) LOCK FLUSH 100 UNIT/ML IV SOLN
500.0000 [IU] | Freq: Once | INTRAVENOUS | Status: AC | PRN
  Administered 2018-08-11: 500 [IU]
  Filled 2018-08-11: qty 5

## 2018-08-11 MED ORDER — SODIUM CHLORIDE 0.9% FLUSH
10.0000 mL | INTRAVENOUS | Status: DC | PRN
  Administered 2018-08-11: 10 mL via INTRAVENOUS
  Filled 2018-08-11: qty 10

## 2018-08-11 MED ORDER — SODIUM CHLORIDE 0.9% FLUSH
10.0000 mL | INTRAVENOUS | Status: DC | PRN
  Administered 2018-08-11: 10 mL
  Filled 2018-08-11: qty 10

## 2018-08-11 NOTE — Progress Notes (Signed)
Per Cassie Heilingoetter PA-C, ok to treat with elevated creatinine.

## 2018-08-11 NOTE — Patient Instructions (Signed)
[facility] Discharge Instructions for Patients Receiving Chemotherapy  Today you received the following chemotherapy agents: Imfinzi.  To help prevent nausea and vomiting after your treatment, we encourage you to take your nausea medication as directed.   If you develop nausea and vomiting that is not controlled by your nausea medication, call the clinic.   BELOW ARE SYMPTOMS THAT SHOULD BE REPORTED IMMEDIATELY:  *FEVER GREATER THAN 100.5 F  *CHILLS WITH OR WITHOUT FEVER  NAUSEA AND VOMITING THAT IS NOT CONTROLLED WITH YOUR NAUSEA MEDICATION  *UNUSUAL SHORTNESS OF BREATH  *UNUSUAL BRUISING OR BLEEDING  TENDERNESS IN MOUTH AND THROAT WITH OR WITHOUT PRESENCE OF ULCERS  *URINARY PROBLEMS  *BOWEL PROBLEMS  UNUSUAL RASH Items with * indicate a potential emergency and should be followed up as soon as possible.  Feel free to call the clinic should you have any questions or concerns. The clinic phone number is [phone].  Please show the CHEMO ALERT CARD at check-in to the Emergency Department and triage nurse.   

## 2018-08-11 NOTE — Progress Notes (Signed)
[facility] OFFICE PROGRESS NOTE  [name], MD [place] [address]  DIAGNOSIS: Stage IIIB (T3, N3, M0) non-small cell lung cancer, squamous cell carcinoma presented with large right middle lobe lung mass in addition to right hilar, subcarinal and right supraclavicular lymphadenopathy diagnosed in August 2019.  PRIOR THERAPY: Concurrent chemoradiation with weekly carboplatin for AUC of 2 and paclitaxel 45 NG/M2.  Status post 6 cycles with partial response.  CURRENT THERAPY: Consolidation immunotherapy with Imfinzi 10 mg/KG every 2 weeks started February 28, 2018.  Status post 11 cycles.  INTERVAL HISTORY: Lisa [name] 85 y.o. female returns to the clinic for a follow-up visit.  The patient is feeling well today without any concerning complaints.  She denies any fever, chills, or night sweats. The patient states that she lost approximately 4 pounds since her last appointment; however, she states that she is eating per her usual dietary habits.  The patient denies any chest pain, shortness of breath, cough, or hemoptysis.  She denies any nausea, vomiting, diarrhea, or constipation.  She denies any headache or visual changes.  She denies any rashes or skin changes.  The patient is here today for evaluation before starting cycle #12.  MEDICAL HISTORY: Past Medical History:  Diagnosis Date  . Diverticulosis   . Headache   . Hemorrhoids    internal and external  . Hyperlipidemia 01/29/1991  . Hypertension 10/29/1995  . Hypothyroidism 10/29/1995  . Lung mass    right middle lobe  . Melanoma ([place])    h/o, local excision. no chemo or rady.   . Skin cancer (melanoma) ([place])   . Tubular adenoma of colon   . Wears dentures     ALLERGIES:  is allergic to atorvastatin; celebrex [celecoxib]; cholecalciferol; and simvastatin.  MEDICATIONS:  Current Outpatient Medications  Medication Sig Dispense Refill  . amLODipine (NORVASC) 10 MG tablet Take 1  tablet by mouth once daily 90 tablet 0  . Cholecalciferol (VITAMIN D) 2000 units tablet Take 2,000 Units by mouth 2 (two) times daily.    . Coenzyme Q10 100 MG capsule Take 100 mg by mouth daily.     Marland Kitchen docusate sodium (COLACE) 100 MG capsule Take 100 mg by mouth daily as needed for mild constipation.    . fluticasone (FLONASE) 50 MCG/ACT nasal spray USE 2 SPRAYS IN EACH NOSTRIL DAILY AS NEEDED FOR ALLERGIES 48 g 3  . levothyroxine (SYNTHROID, LEVOTHROID) 75 MCG tablet Take 1 tablet by mouth once daily 90 tablet 3  . lidocaine-prilocaine (EMLA) cream Apply 1 application topically as needed. 30 g 0  . mirtazapine (REMERON) 7.5 MG tablet Take 1 tablet (7.5 mg total) by mouth at bedtime. 30 tablet 5  . potassium chloride SA (K-DUR,KLOR-CON) 20 MEQ tablet Take 1 tablet (20 mEq total) by mouth 2 (two) times daily. 30 tablet 0  . prochlorperazine (COMPAZINE) 10 MG tablet Take 1 tablet (10 mg total) by mouth every 6 (six) hours as needed for nausea or vomiting. 30 tablet 0  . rivaroxaban (XARELTO) 20 MG TABS tablet Take 1 tablet (20 mg total) by mouth daily with supper. 30 tablet 1  . sennosides-docusate sodium (SENOKOT-S) 8.6-50 MG tablet Take 1 tablet by mouth daily as needed for constipation.    Marland Kitchen loratadine (CLARITIN) 10 MG tablet Take 1 tablet (10 mg total) by mouth daily as needed for allergies or rhinitis. (Patient not taking: Reported on 08/11/2018)     No current facility-administered medications for this  visit.    Facility-Administered Medications Ordered in Other Visits  Medication Dose Route Frequency Provider Last Rate Last Dose  . 0.9 %  sodium chloride infusion   Intravenous Once Curt Bears, MD      . acetaminophen (TYLENOL) tablet 650 mg  650 mg Oral Once Nicholas Lose, MD      . durvalumab (IMFINZI) 740 mg in sodium chloride 0.9 % 100 mL chemo infusion  10 mg/kg (Treatment Plan Recorded) Intravenous Once Curt Bears, MD      . heparin lock flush 100 unit/mL  500 Units  Intracatheter Once PRN Curt Bears, MD      . sodium chloride flush (NS) 0.9 % injection 10 mL  10 mL Intracatheter PRN Curt Bears, MD        SURGICAL HISTORY:  Past Surgical History:  Procedure Laterality Date  . BRONCHIAL BIOPSY  11/23/2017   Procedure: BRONCHIAL BIOPSIES;  Surgeon: Grace Isaac, MD;  Location: [facility] OR;  Service: Thoracic;;  . cataract surgery  2007, 2008   repair bilat  . DG BARIUM ENEMA ([place] [facility]) N/A 2016   [name]  . HERNIA REPAIR  04/25/2001  . IR IMAGING GUIDED PORT INSERTION  07/05/2018  . IR US GUIDE BX ASP/DRAIN  11/17/2017  . KNEE SURGERY     meniscus tear per Dr. Ronnie Derby, R knee  . MULTIPLE TOOTH EXTRACTIONS    . SEPTOPLASTY  2006   Dr. Ernesto Rutherford  . SKIN CANCER EXCISION    . TONSILLECTOMY  1954  . VAGINAL HYSTERECTOMY    . VIDEO BRONCHOSCOPY WITH ENDOBRONCHIAL ULTRASOUND N/A 11/23/2017   Procedure: VIDEO BRONCHOSCOPY WITH ENDOBRONCHIAL ULTRASOUND;  Surgeon: Grace Isaac, MD;  Location: [facility];  Service: Thoracic;  Laterality: N/A;    REVIEW OF SYSTEMS:   Review of Systems  Constitutional: Negative for appetite change, chills, fatigue, fever and unexpected weight change.  HENT:   Negative for mouth sores, nosebleeds, sore throat and trouble swallowing.   Eyes: Negative for eye problems and icterus.  Respiratory: Negative for cough, hemoptysis, shortness of breath and wheezing.   Cardiovascular: Negative for chest pain and leg swelling.  Gastrointestinal: Negative for abdominal pain, constipation, diarrhea, nausea and vomiting.  Genitourinary: Negative for bladder incontinence, difficulty urinating, dysuria, frequency and hematuria.   Musculoskeletal: Negative for back pain, gait problem, neck pain and neck stiffness.  Skin: Negative for itching and rash.  Neurological: Negative for dizziness, extremity weakness, gait problem, headaches, light-headedness and seizures.  Hematological: Negative for adenopathy. Does not bruise/bleed  easily.  Psychiatric/Behavioral: Negative for confusion, depression and sleep disturbance. The patient is not nervous/anxious.     PHYSICAL EXAMINATION:  Blood pressure (!) 144/88, pulse 99, temperature 99.1 F (37.3 C), temperature source Oral, resp. rate 20, height 5\' 8"  (1.727 m), weight 151 lb 12.8 oz (68.9 kg), SpO2 100 %.  ECOG PERFORMANCE STATUS: 1 - Symptomatic but completely ambulatory  Physical Exam  Constitutional: Oriented to person, place, and time and well-developed, well-nourished, and in no distress.  HENT:  Head: Normocephalic and atraumatic.  Mouth/Throat: Oropharynx is clear and moist. No oropharyngeal exudate.  Eyes: Conjunctivae are normal. Right eye exhibits no discharge. Left eye exhibits no discharge. No scleral icterus.  Neck: Normal range of motion. Neck supple.  Cardiovascular: Normal rate, regular rhythm, normal heart sounds and intact distal pulses.   Pulmonary/Chest: Effort normal and breath sounds normal. No respiratory distress. No wheezes. No rales.  Abdominal: Soft. Bowel sounds are normal. Exhibits no distension and no mass.  There is no tenderness.  Musculoskeletal: Normal range of motion. Exhibits no edema.  Lymphadenopathy:    No cervical adenopathy.  Neurological: Alert and oriented to person, place, and time. Exhibits normal muscle tone. Gait normal. Coordination normal.  Skin: Skin is warm and dry. No rash noted. Not diaphoretic. No erythema. No pallor.  Psychiatric: Mood, memory and judgment normal.  Vitals reviewed.  LABORATORY DATA: Lab Results  Component Value Date   WBC 5.3 08/11/2018   HGB 12.2 08/11/2018   HCT 38.7 08/11/2018   MCV 93.7 08/11/2018   PLT 164 08/11/2018      Chemistry      Component Value Date/Time   NA 142 08/11/2018 1222   K 3.8 08/11/2018 1222   CL 108 08/11/2018 1222   CO2 27 08/11/2018 1222   BUN 28 (H) 08/11/2018 1222   CREATININE 1.55 (H) 08/11/2018 1222      Component Value Date/Time   CALCIUM 9.7  08/11/2018 1222   ALKPHOS 78 08/11/2018 1222   AST 20 08/11/2018 1222   ALT 13 08/11/2018 1222   BILITOT 0.3 08/11/2018 1222       RADIOGRAPHIC STUDIES:  No results found.   ASSESSMENT/PLAN:  This is a very pleasant 85 year old Caucasian female with stage IIIb non-small cell lung cancer, squamous cell carcinoma.  She presented with a large right middle lobe lung mass in addition to right hilar, subcarinal, and left supraclavicular lymphadenopathy.  She was diagnosed in August 2019.  She completed a course of concurrent chemoradiation with carboplatin and paclitaxel.  She is status post 6 cycles with a partial response.  She is currently undergoing consolidation immunotherapy with Imfinzi 10 mg/kg IV every 2 weeks.  She is status post 10 cycles.  She tolerated treatment well without any adverse effects.  The patient was seen with Dr. Julien Nordmann today.  Labs were reviewed with the patient.  We recommend that she proceed with cycle #12 today as scheduled. I will arrange for restaging CT scan of the chest to be performed prior to her next visit. I will order it without contrast due to her renal insufficiency.   I will see the patient back for follow-up visit in 2 weeks for evaluation prior to starting cycle #13. I sent a refill for EMLA cream to her pharmacy. The patient was advised to call immediately if she has any concerning symptoms in the interval. The patient voices understanding of current disease status and treatment options and is in agreement with the current care plan. All questions were answered. The patient knows to call the clinic with any problems, questions or concerns. We can certainly see the patient much sooner if necessary  Orders Placed This Encounter  Procedures  . CT Chest Wo Contrast    Standing Status:   Future    Standing Expiration Date:   08/11/2019    Order Specific Question:   ** REASON FOR EXAM (FREE TEXT)    Answer:   Restaging Lung Cancer    Order Specific  Question:   Preferred imaging location?    Answer:   [facility]    Order Specific Question:   Radiology Contrast Protocol - do NOT remove file path    Answer:   [email or web address]     Ajeet Casasola L Keoshia Steinmetz, PA-C 08/11/18  ADDENDUM: Hematology/Oncology Attending: I had a face-to-face encounter with the patient.  I recommended her care plan.  This is a very pleasant 85 years old white female with a stage IIIb non-small cell lung  cancer, squamous cell carcinoma status post induction concurrent chemoradiation with weekly carboplatin and paclitaxel.  The patient is currently undergoing consolidation treatment with Imfinzi every 2 weeks status post 11 cycles. She has been tolerating this treatment well with no concerning adverse effects. I recommended for her to proceed with cycle #12 today as planned. I will see her back for follow-up visit in 2 weeks for evaluation after repeating CT scan of the chest for restaging of her disease. The patient was advised to call immediately if she has any concerning symptoms in the interval. Disclaimer: This note was dictated with voice recognition software. Similar sounding words can inadvertently be transcribed and may be missed upon review. Eilleen Kempf, MD 08/12/18

## 2018-08-12 ENCOUNTER — Encounter: Payer: Self-pay | Admitting: Physician Assistant

## 2018-08-17 ENCOUNTER — Ambulatory Visit: Payer: Medicare Other | Admitting: Physician Assistant

## 2018-08-17 ENCOUNTER — Other Ambulatory Visit: Payer: Medicare Other

## 2018-08-17 ENCOUNTER — Ambulatory Visit: Payer: Medicare Other

## 2018-08-23 ENCOUNTER — Encounter (HOSPITAL_COMMUNITY): Payer: Self-pay

## 2018-08-23 ENCOUNTER — Ambulatory Visit (HOSPITAL_COMMUNITY)
Admission: RE | Admit: 2018-08-23 | Discharge: 2018-08-23 | Disposition: A | Discharge status: Home / Self Care | Payer: Medicare Other | Source: Ambulatory Visit | Attending: Physician Assistant | Admitting: Physician Assistant

## 2018-08-23 DIAGNOSIS — C3491 Malignant neoplasm of unspecified part of right bronchus or lung: Secondary | ICD-10-CM

## 2018-08-23 DIAGNOSIS — J90 Pleural effusion, not elsewhere classified: Secondary | ICD-10-CM | POA: Diagnosis not present

## 2018-08-25 ENCOUNTER — Inpatient Hospital Stay: Payer: Medicare Other

## 2018-08-25 ENCOUNTER — Inpatient Hospital Stay (HOSPITAL_BASED_OUTPATIENT_CLINIC_OR_DEPARTMENT_OTHER): Payer: Medicare Other | Admitting: Physician Assistant

## 2018-08-25 ENCOUNTER — Encounter: Payer: Self-pay | Admitting: Physician Assistant

## 2018-08-25 ENCOUNTER — Other Ambulatory Visit: Payer: Self-pay

## 2018-08-25 VITALS — BP 133/80 | HR 88 | Temp 99.2°F | Resp 20 | Ht 68.0 in | Wt 153.0 lb

## 2018-08-25 DIAGNOSIS — C770 Secondary and unspecified malignant neoplasm of lymph nodes of head, face and neck: Secondary | ICD-10-CM

## 2018-08-25 DIAGNOSIS — E785 Hyperlipidemia, unspecified: Secondary | ICD-10-CM

## 2018-08-25 DIAGNOSIS — Z79899 Other long term (current) drug therapy: Secondary | ICD-10-CM

## 2018-08-25 DIAGNOSIS — I10 Essential (primary) hypertension: Secondary | ICD-10-CM | POA: Diagnosis not present

## 2018-08-25 DIAGNOSIS — E039 Hypothyroidism, unspecified: Secondary | ICD-10-CM | POA: Diagnosis not present

## 2018-08-25 DIAGNOSIS — Z85820 Personal history of malignant melanoma of skin: Secondary | ICD-10-CM

## 2018-08-25 DIAGNOSIS — Z95828 Presence of other vascular implants and grafts: Secondary | ICD-10-CM

## 2018-08-25 DIAGNOSIS — Z7951 Long term (current) use of inhaled steroids: Secondary | ICD-10-CM

## 2018-08-25 DIAGNOSIS — Z7901 Long term (current) use of anticoagulants: Secondary | ICD-10-CM | POA: Diagnosis not present

## 2018-08-25 DIAGNOSIS — Z5112 Encounter for antineoplastic immunotherapy: Secondary | ICD-10-CM | POA: Diagnosis not present

## 2018-08-25 DIAGNOSIS — R590 Localized enlarged lymph nodes: Secondary | ICD-10-CM

## 2018-08-25 DIAGNOSIS — C3491 Malignant neoplasm of unspecified part of right bronchus or lung: Secondary | ICD-10-CM

## 2018-08-25 DIAGNOSIS — C342 Malignant neoplasm of middle lobe, bronchus or lung: Secondary | ICD-10-CM | POA: Diagnosis not present

## 2018-08-25 LAB — CBC WITH DIFFERENTIAL (CANCER CENTER ONLY)
Abs Immature Granulocytes: 0.02 10*3/uL (ref 0.00–0.07)
Basophils Absolute: 0 10*3/uL (ref 0.0–0.1)
Basophils Relative: 1 %
Eosinophils Absolute: 0.1 10*3/uL (ref 0.0–0.5)
Eosinophils Relative: 1 %
HCT: 38.7 % (ref 36.0–46.0)
Hemoglobin: 12.2 g/dL (ref 12.0–15.0)
Immature Granulocytes: 0 %
Lymphocytes Relative: 25 %
Lymphs Abs: 1.4 10*3/uL (ref 0.7–4.0)
MCH: 29.4 pg (ref 26.0–34.0)
MCHC: 31.5 g/dL (ref 30.0–36.0)
MCV: 93.3 fL (ref 80.0–100.0)
Monocytes Absolute: 0.4 10*3/uL (ref 0.1–1.0)
Monocytes Relative: 8 %
Neutro Abs: 3.7 10*3/uL (ref 1.7–7.7)
Neutrophils Relative %: 65 %
Platelet Count: 144 10*3/uL — ABNORMAL LOW (ref 150–400)
RBC: 4.15 MIL/uL (ref 3.87–5.11)
RDW: 14 % (ref 11.5–15.5)
WBC Count: 5.7 10*3/uL (ref 4.0–10.5)
nRBC: 0 % (ref 0.0–0.2)

## 2018-08-25 LAB — CMP (CANCER CENTER ONLY)
ALT: 10 U/L (ref 0–44)
AST: 20 U/L (ref 15–41)
Albumin: 3.7 g/dL (ref 3.5–5.0)
Alkaline Phosphatase: 72 U/L (ref 38–126)
Anion gap: 8 (ref 5–15)
BUN: 24 mg/dL — ABNORMAL HIGH (ref 8–23)
CO2: 26 mmol/L (ref 22–32)
Calcium: 9.6 mg/dL (ref 8.9–10.3)
Chloride: 107 mmol/L (ref 98–111)
Creatinine: 1.53 mg/dL — ABNORMAL HIGH (ref 0.44–1.00)
GFR, Est AFR Am: 36 mL/min — ABNORMAL LOW (ref >60)
GFR, Estimated: 31 mL/min — ABNORMAL LOW (ref >60)
Glucose, Bld: 107 mg/dL — ABNORMAL HIGH (ref 70–99)
Potassium: 4 mmol/L (ref 3.5–5.1)
Sodium: 141 mmol/L (ref 135–145)
Total Bilirubin: 0.4 mg/dL (ref 0.3–1.2)
Total Protein: 6.8 g/dL (ref 6.5–8.1)

## 2018-08-25 MED ORDER — SODIUM CHLORIDE 0.9% FLUSH
10.0000 mL | INTRAVENOUS | Status: AC
  Administered 2018-08-25: 10 mL via INTRAVENOUS
  Filled 2018-08-25: qty 10

## 2018-08-25 MED ORDER — SODIUM CHLORIDE 0.9 % IV SOLN
10.0000 mg/kg | Freq: Once | INTRAVENOUS | Status: AC
  Administered 2018-08-25: 740 mg via INTRAVENOUS
  Filled 2018-08-25: qty 10

## 2018-08-25 MED ORDER — SODIUM CHLORIDE 0.9 % IV SOLN
Freq: Once | INTRAVENOUS | Status: AC
  Administered 2018-08-25: 16:00:00 via INTRAVENOUS
  Filled 2018-08-25: qty 250

## 2018-08-25 MED ORDER — HEPARIN SOD (PORK) LOCK FLUSH 100 UNIT/ML IV SOLN
500.0000 [IU] | Freq: Once | INTRAVENOUS | Status: AC | PRN
  Administered 2018-08-25: 500 [IU]
  Filled 2018-08-25: qty 5

## 2018-08-25 MED ORDER — SODIUM CHLORIDE 0.9% FLUSH
10.0000 mL | INTRAVENOUS | Status: DC | PRN
  Administered 2018-08-25: 10 mL
  Filled 2018-08-25: qty 10

## 2018-08-25 NOTE — Progress Notes (Signed)
Per Cassie Heilingoetter, PA ok to treat today with elevated Crt.

## 2018-08-25 NOTE — Progress Notes (Signed)
Per Cassie Hellingoepter NP ok to tx today with creatinine of 1.53.

## 2018-08-25 NOTE — Patient Instructions (Signed)
[facility] Discharge Instructions for Patients Receiving Chemotherapy  Today you received the following chemotherapy agents: Imfinzi.  To help prevent nausea and vomiting after your treatment, we encourage you to take your nausea medication as directed.   If you develop nausea and vomiting that is not controlled by your nausea medication, call the clinic.   BELOW ARE SYMPTOMS THAT SHOULD BE REPORTED IMMEDIATELY:  *FEVER GREATER THAN 100.5 F  *CHILLS WITH OR WITHOUT FEVER  NAUSEA AND VOMITING THAT IS NOT CONTROLLED WITH YOUR NAUSEA MEDICATION  *UNUSUAL SHORTNESS OF BREATH  *UNUSUAL BRUISING OR BLEEDING  TENDERNESS IN MOUTH AND THROAT WITH OR WITHOUT PRESENCE OF ULCERS  *URINARY PROBLEMS  *BOWEL PROBLEMS  UNUSUAL RASH Items with * indicate a potential emergency and should be followed up as soon as possible.  Feel free to call the clinic should you have any questions or concerns. The clinic phone number is [phone].  Please show the CHEMO ALERT CARD at check-in to the Emergency Department and triage nurse.   

## 2018-08-25 NOTE — Progress Notes (Signed)
Lisa [name] OFFICE PROGRESS NOTE  [name], MD [place] [address]  DIAGNOSIS: Stage IIIB (T3, N3, M0) non-small cell lung cancer, squamous cell carcinoma presented with large right middle lobe lung mass in addition to right hilar, subcarinal and right supraclavicular lymphadenopathy diagnosed in August 2019  PRIOR THERAPY: Concurrent chemoradiation with weekly carboplatin for AUC of 2 and paclitaxel 45 NG/M2. Status post 6 cycles with partial response.  CURRENT THERAPY: Consolidation immunotherapy with Imfinzi 10 mg/KG every 2 weeks started February 28, 2018. Status post 12cycles.  INTERVAL HISTORY: Lisa [name] 85 y.o. female returns to the clinic for a follow-up visit.  The patient is doing well today without any concerning complaints.  She denies any fever, chills, night sweats, or weight loss.  She denies any chest pain, shortness of breath, cough, or hemoptysis.  She denies any nausea, vomiting, diarrhea, or constipation.  She denies any headaches or visual changes.  She denies any rashes or skin changes.  The patient recently had a restaging CT scan performed.  She is here today for evaluation before starting cycle #13.  MEDICAL HISTORY: Past Medical History:  Diagnosis Date  . Diverticulosis   . Headache   . Hemorrhoids    internal and external  . Hyperlipidemia 01/29/1991  . Hypertension 10/29/1995  . Hypothyroidism 10/29/1995  . Lung mass    right middle lobe  . Melanoma ([place])    h/o, local excision. no chemo or rady.   . Skin cancer (melanoma) ([place])   . Tubular adenoma of colon   . Wears dentures     ALLERGIES:  is allergic to atorvastatin; celebrex [celecoxib]; cholecalciferol; and simvastatin.  MEDICATIONS:  Current Outpatient Medications  Medication Sig Dispense Refill  . amLODipine (NORVASC) 10 MG tablet Take 1 tablet by mouth once daily 90 tablet 0  . Cholecalciferol (VITAMIN D) 2000 units tablet Take 2,000  Units by mouth 2 (two) times daily.    . Coenzyme Q10 100 MG capsule Take 100 mg by mouth daily.     Marland Kitchen docusate sodium (COLACE) 100 MG capsule Take 100 mg by mouth daily as needed for mild constipation.    . fluticasone (FLONASE) 50 MCG/ACT nasal spray USE 2 SPRAYS IN EACH NOSTRIL DAILY AS NEEDED FOR ALLERGIES 48 g 3  . levothyroxine (SYNTHROID, LEVOTHROID) 75 MCG tablet Take 1 tablet by mouth once daily 90 tablet 3  . lidocaine-prilocaine (EMLA) cream Apply 1 application topically as needed. 30 g 0  . mirtazapine (REMERON) 7.5 MG tablet Take 1 tablet (7.5 mg total) by mouth at bedtime. 30 tablet 5  . rivaroxaban (XARELTO) 20 MG TABS tablet Take 1 tablet (20 mg total) by mouth daily with supper. 30 tablet 1  . sennosides-docusate sodium (SENOKOT-S) 8.6-50 MG tablet Take 1 tablet by mouth daily as needed for constipation.    Marland Kitchen loratadine (CLARITIN) 10 MG tablet Take 1 tablet (10 mg total) by mouth daily as needed for allergies or rhinitis. (Patient not taking: Reported on 08/11/2018)    . potassium chloride SA (K-DUR,KLOR-CON) 20 MEQ tablet Take 1 tablet (20 mEq total) by mouth 2 (two) times daily. 30 tablet 0  . prochlorperazine (COMPAZINE) 10 MG tablet Take 1 tablet (10 mg total) by mouth every 6 (six) hours as needed for nausea or vomiting. (Patient not taking: Reported on 08/25/2018) 30 tablet 0   No current facility-administered medications for this visit.    Facility-Administered Medications Ordered in Other Visits  Medication  Dose Route Frequency Provider Last Rate Last Dose  . acetaminophen (TYLENOL) tablet 650 mg  650 mg Oral Once Nicholas Lose, MD      . durvalumab (IMFINZI) 740 mg in sodium chloride 0.9 % 100 mL chemo infusion  10 mg/kg (Treatment Plan Recorded) Intravenous Once Curt Bears, MD      . heparin lock flush 100 unit/mL  500 Units Intracatheter Once PRN Curt Bears, MD      . sodium chloride flush (NS) 0.9 % injection 10 mL  10 mL Intracatheter PRN Curt Bears,  MD        SURGICAL HISTORY:  Past Surgical History:  Procedure Laterality Date  . BRONCHIAL BIOPSY  11/23/2017   Procedure: BRONCHIAL BIOPSIES;  Surgeon: Grace Isaac, MD;  Location: [facility] OR;  Service: Thoracic;;  . cataract surgery  2007, 2008   repair bilat  . DG BARIUM ENEMA ([place] HX) N/A 2016   [name]  . HERNIA REPAIR  04/25/2001  . IR IMAGING GUIDED PORT INSERTION  07/05/2018  . IR US GUIDE BX ASP/DRAIN  11/17/2017  . KNEE SURGERY     meniscus tear per Dr. Ronnie Derby, R knee  . MULTIPLE TOOTH EXTRACTIONS    . SEPTOPLASTY  2006   Dr. Ernesto Rutherford  . SKIN CANCER EXCISION    . TONSILLECTOMY  1954  . VAGINAL HYSTERECTOMY    . VIDEO BRONCHOSCOPY WITH ENDOBRONCHIAL ULTRASOUND N/A 11/23/2017   Procedure: VIDEO BRONCHOSCOPY WITH ENDOBRONCHIAL ULTRASOUND;  Surgeon: Grace Isaac, MD;  Location: [place];  Service: Thoracic;  Laterality: N/A;    REVIEW OF SYSTEMS:   Review of Systems  Constitutional: Negative for appetite change, chills, fatigue, fever and unexpected weight change.  HENT:   Negative for mouth sores, nosebleeds, sore throat and trouble swallowing.   Eyes: Negative for eye problems and icterus.  Respiratory: Negative for cough, hemoptysis, shortness of breath and wheezing.   Cardiovascular: Negative for chest pain and leg swelling.  Gastrointestinal: Negative for abdominal pain, constipation, diarrhea, nausea and vomiting.  Genitourinary: Negative for bladder incontinence, difficulty urinating, dysuria, frequency and hematuria.   Musculoskeletal: Negative for back pain, gait problem, neck pain and neck stiffness.  Skin: Negative for itching and rash.  Neurological: Negative for dizziness, extremity weakness, gait problem, headaches, light-headedness and seizures.  Hematological: Negative for adenopathy. Does not bruise/bleed easily.  Psychiatric/Behavioral: Negative for confusion, depression and sleep disturbance. The patient is not nervous/anxious.     PHYSICAL  EXAMINATION:  Blood pressure 133/80, pulse 88, temperature 99.2 F (37.3 C), temperature source Oral, resp. rate 20, height 5\' 8"  (1.727 m), weight 153 lb (69.4 kg), SpO2 97 %.  ECOG PERFORMANCE STATUS: 1 - Symptomatic but completely ambulatory  Physical Exam  Constitutional: Oriented to person, place, and time and well-developed, well-nourished, and in no distress. Marland Kitchen  HENT:  Head: Normocephalic and atraumatic.  Mouth/Throat: Oropharynx is clear and moist. No oropharyngeal exudate.  Eyes: Conjunctivae are normal. Right eye exhibits no discharge. Left eye exhibits no discharge. No scleral icterus.  Neck: Normal range of motion. Neck supple.  Cardiovascular: Normal rate, regular rhythm, normal heart sounds and intact distal pulses.   Pulmonary/Chest: Effort normal and breath sounds normal. No respiratory distress. No wheezes. No rales.  Abdominal: Soft. Bowel sounds are normal. Exhibits no distension and no mass. There is no tenderness.  Musculoskeletal: Normal range of motion. Exhibits no edema.  Lymphadenopathy:    No cervical adenopathy.  Neurological: Alert and oriented to person, place, and time. Exhibits normal  muscle tone. Gait normal. Coordination normal.  Skin: Skin is warm and dry. No rash noted. Not diaphoretic. No erythema. No pallor.  Psychiatric: Mood, memory and judgment normal.  Vitals reviewed.  LABORATORY DATA: Lab Results  Component Value Date   WBC 5.7 08/25/2018   HGB 12.2 08/25/2018   HCT 38.7 08/25/2018   MCV 93.3 08/25/2018   PLT 144 (L) 08/25/2018      Chemistry      Component Value Date/Time   NA 141 08/25/2018 1400   K 4.0 08/25/2018 1400   CL 107 08/25/2018 1400   CO2 26 08/25/2018 1400   BUN 24 (H) 08/25/2018 1400   CREATININE 1.53 (H) 08/25/2018 1400      Component Value Date/Time   CALCIUM 9.6 08/25/2018 1400   ALKPHOS 72 08/25/2018 1400   AST 20 08/25/2018 1400   ALT 10 08/25/2018 1400   BILITOT 0.4 08/25/2018 1400       RADIOGRAPHIC  STUDIES:  Ct Chest Wo Contrast  Result Date: 08/23/2018 CLINICAL DATA:  Right lung cancer, s/p chemo XRT, ongoing immunotherapy EXAM: CT CHEST WITHOUT CONTRAST TECHNIQUE: Multidetector CT imaging of the chest was performed following the standard protocol without IV contrast. COMPARISON:  05/23/2018, 02/14/2018 FINDINGS: Cardiovascular: Aortic atherosclerosis. No change in enlargement of the tubular thoracic aorta measuring 4.8 x 4.6 cm. Normal heart size. Trace pericardial effusion. Mediastinum/Nodes: Prominent mediastinal lymph nodes are stable or slightly decreased in size. Thyroid gland, trachea, and esophagus demonstrate no significant findings. Lungs/Pleura: Interval increase in postradiation fibrotic consolidation of the perihilar right lung, generally obscuring a primary right middle lobe malignancy (series 5, image 80). Unchanged small right pleural effusion with associated atelectasis or consolidation. Upper Abdomen: No acute abnormality. Musculoskeletal: No chest wall mass or suspicious bone lesions identified. IMPRESSION: 1. Interval increase in postradiation fibrotic consolidation of the perihilar right lung, generally obscuring a primary right middle lobe malignancy (series 5, image 80). No evidence of disease progression. Attention on follow-up. 2. Unchanged small right pleural effusion with associated atelectasis or consolidation. Electronically Signed   By: Eddie Candle M.D.   On: 08/23/2018 16:40     ASSESSMENT/PLAN:  This is a very pleasant 85 year old Caucasian female with stage IIIb non-small cell lung cancer, squamous cell carcinoma.  She presented with a large right middle lobe lung mass in addition to right hilar, subcarinal, and left supraclavicular lymphadenopathy.  She was diagnosed in August 2019.  She completed a course of concurrent chemoradiation with carboplatin and paclitaxel.  She is status post 6 cycles with a partial response.  She is currently undergoing consolidation  immunotherapy with Imfinzi 10 mg/kg IV every 2 weeks.  She is status post 12 cycles.  She tolerated treatment well without any adverse effects.  The patient recently had a restaging CT scan performed.  Dr. Julien Nordmann personally and independently reviewed the scan and discussed the results with the patient today.  The scan showed no evidence of disease progression.  We recommend that she proceed with cycle #13 today as scheduled.  I will see her back for follow-up visit in 2 weeks for evaluation before starting cycle #14.  The patient was advised to call immediately if she has any concerning symptoms in the interval. The patient voices understanding of current disease status and treatment options and is in agreement with the current care plan. All questions were answered. The patient knows to call the clinic with any problems, questions or concerns. We can certainly see the patient much sooner if necessary  No orders of the defined types were placed in this encounter.    Jshon Ibe L Ramello Cordial, PA-C 08/25/18  ADDENDUM: Hematology/Oncology Attending: I had a face-to-face encounter with the patient today.  I recommended her care plan.  This is a very pleasant 85 years old white female with history of a stage IIIb non-small cell lung cancer, squamous cell carcinoma status post induction concurrent chemoradiation with weekly carboplatin and paclitaxel with partial response.  The patient is currently undergoing consolidation treatment with immunotherapy status post 12 cycles of Imfinzi.  She has been tolerating her treatment well with no concerning adverse effects. She had repeat CT scan of the chest performed recently.  I personally and independently reviewed the scans and discussed the results with the patient today. Her scan showed no concerning findings for disease progression. I recommended for the patient to continue her current treatment with Imfinzi and she will proceed with cycle #13  today. She will come back for follow-up visit in 3 weeks for evaluation before the next cycle of her treatment. She was advised to call immediately if she has any concerning symptoms in the interval.  Disclaimer: This note was dictated with voice recognition software. Similar sounding words can inadvertently be transcribed and may be missed upon review. Eilleen Kempf, MD 08/25/18

## 2018-08-29 ENCOUNTER — Other Ambulatory Visit: Payer: Self-pay | Admitting: Family Medicine

## 2018-08-29 NOTE — Telephone Encounter (Unsigned)
Copied from [place] [id number]. Topic: Quick Communication - Rx Refill/Question >> Aug 29, 2018  4:10 PM Celene Kras A wrote: Medication: amLODipine (NORVASC) 10 MG tablet, rivaroxaban (XARELTO) 20 MG TABS tablet, levothyroxine (SYNTHROID, LEVOTHROID) 75 MCG tablet, mirtazapine (REMERON) 7.5 MG tablet  Has the patient contacted their pharmacy? No. Pt states she is concerned about taking some of these medications still. She also states she has one pill of a few of these medications left. Please advise.  (Agent: If no, request that the patient contact the pharmacy for the refill.) (Agent: If yes, when and what did the pharmacy advise?)  Preferred Pharmacy (with phone number or street name): [facility] [place], [place] [address] Phone: [phone] Fax: [phone] Not a 24 hour pharmacy; exact hours not known.    Agent: Please be advised that RX refills may take up to 3 business days. We ask that you follow-up with your pharmacy.

## 2018-08-30 ENCOUNTER — Other Ambulatory Visit: Payer: Self-pay | Admitting: Family Medicine

## 2018-08-30 NOTE — Telephone Encounter (Signed)
Dr. Damita Dunnings please advise pt should still have refills for levothyroxine at pharmacy. Pt concerned she not sure if she should be taking some of these medications still.

## 2018-08-30 NOTE — Telephone Encounter (Signed)
Recheck TSH recently wnl.  I wouldn't change her thyroid medicine.  Assuming she is still taking levothyroxine 78mcg daily, then I would continue as is.  Thanks.

## 2018-08-30 NOTE — Telephone Encounter (Signed)
Spoke to pt. She stated that she understands. She will continue to take the levothyroxine.

## 2018-08-31 ENCOUNTER — Ambulatory Visit: Payer: Medicare Other | Admitting: Internal Medicine

## 2018-08-31 ENCOUNTER — Ambulatory Visit: Payer: Medicare Other

## 2018-08-31 ENCOUNTER — Other Ambulatory Visit: Payer: Medicare Other

## 2018-09-01 ENCOUNTER — Telehealth: Payer: Self-pay | Admitting: Medical Oncology

## 2018-09-01 NOTE — Telephone Encounter (Signed)
[name] said the pt forgot about her appts yesterday. I left a voice mail telling [name] that she did not miss an appt . And her next appt is June 10th

## 2018-09-07 ENCOUNTER — Inpatient Hospital Stay (HOSPITAL_BASED_OUTPATIENT_CLINIC_OR_DEPARTMENT_OTHER): Payer: Medicare Other | Admitting: Internal Medicine

## 2018-09-07 ENCOUNTER — Inpatient Hospital Stay: Payer: Medicare Other | Attending: Internal Medicine

## 2018-09-07 ENCOUNTER — Inpatient Hospital Stay: Payer: Medicare Other

## 2018-09-07 ENCOUNTER — Other Ambulatory Visit: Payer: Self-pay

## 2018-09-07 ENCOUNTER — Encounter: Payer: Self-pay | Admitting: Internal Medicine

## 2018-09-07 VITALS — BP 122/71 | HR 92 | Temp 98.3°F | Resp 18 | Ht 68.0 in | Wt 155.8 lb

## 2018-09-07 DIAGNOSIS — E785 Hyperlipidemia, unspecified: Secondary | ICD-10-CM

## 2018-09-07 DIAGNOSIS — E039 Hypothyroidism, unspecified: Secondary | ICD-10-CM | POA: Insufficient documentation

## 2018-09-07 DIAGNOSIS — Z923 Personal history of irradiation: Secondary | ICD-10-CM

## 2018-09-07 DIAGNOSIS — C3491 Malignant neoplasm of unspecified part of right bronchus or lung: Secondary | ICD-10-CM

## 2018-09-07 DIAGNOSIS — C342 Malignant neoplasm of middle lobe, bronchus or lung: Secondary | ICD-10-CM | POA: Insufficient documentation

## 2018-09-07 DIAGNOSIS — Z9221 Personal history of antineoplastic chemotherapy: Secondary | ICD-10-CM | POA: Diagnosis not present

## 2018-09-07 DIAGNOSIS — Z5112 Encounter for antineoplastic immunotherapy: Secondary | ICD-10-CM | POA: Diagnosis not present

## 2018-09-07 DIAGNOSIS — Z79899 Other long term (current) drug therapy: Secondary | ICD-10-CM

## 2018-09-07 DIAGNOSIS — Z85820 Personal history of malignant melanoma of skin: Secondary | ICD-10-CM | POA: Diagnosis not present

## 2018-09-07 DIAGNOSIS — Z7951 Long term (current) use of inhaled steroids: Secondary | ICD-10-CM | POA: Insufficient documentation

## 2018-09-07 DIAGNOSIS — Z7901 Long term (current) use of anticoagulants: Secondary | ICD-10-CM | POA: Insufficient documentation

## 2018-09-07 DIAGNOSIS — R5383 Other fatigue: Secondary | ICD-10-CM | POA: Diagnosis not present

## 2018-09-07 DIAGNOSIS — C770 Secondary and unspecified malignant neoplasm of lymph nodes of head, face and neck: Secondary | ICD-10-CM | POA: Insufficient documentation

## 2018-09-07 DIAGNOSIS — I10 Essential (primary) hypertension: Secondary | ICD-10-CM | POA: Insufficient documentation

## 2018-09-07 DIAGNOSIS — Z95828 Presence of other vascular implants and grafts: Secondary | ICD-10-CM

## 2018-09-07 LAB — CMP (CANCER CENTER ONLY)
ALT: 12 U/L (ref 0–44)
AST: 20 U/L (ref 15–41)
Albumin: 3.5 g/dL (ref 3.5–5.0)
Alkaline Phosphatase: 71 U/L (ref 38–126)
Anion gap: 8 (ref 5–15)
BUN: 30 mg/dL — ABNORMAL HIGH (ref 8–23)
CO2: 24 mmol/L (ref 22–32)
Calcium: 8.6 mg/dL — ABNORMAL LOW (ref 8.9–10.3)
Chloride: 111 mmol/L (ref 98–111)
Creatinine: 1.52 mg/dL — ABNORMAL HIGH (ref 0.44–1.00)
GFR, Est AFR Am: 36 mL/min — ABNORMAL LOW (ref >60)
GFR, Estimated: 31 mL/min — ABNORMAL LOW (ref >60)
Glucose, Bld: 97 mg/dL (ref 70–99)
Potassium: 3.9 mmol/L (ref 3.5–5.1)
Sodium: 143 mmol/L (ref 135–145)
Total Bilirubin: 0.4 mg/dL (ref 0.3–1.2)
Total Protein: 6.5 g/dL (ref 6.5–8.1)

## 2018-09-07 LAB — CBC WITH DIFFERENTIAL (CANCER CENTER ONLY)
Abs Immature Granulocytes: 0.01 10*3/uL (ref 0.00–0.07)
Basophils Absolute: 0 10*3/uL (ref 0.0–0.1)
Basophils Relative: 1 %
Eosinophils Absolute: 0.1 10*3/uL (ref 0.0–0.5)
Eosinophils Relative: 2 %
HCT: 37.3 % (ref 36.0–46.0)
Hemoglobin: 11.7 g/dL — ABNORMAL LOW (ref 12.0–15.0)
Immature Granulocytes: 0 %
Lymphocytes Relative: 20 %
Lymphs Abs: 1 10*3/uL (ref 0.7–4.0)
MCH: 29.1 pg (ref 26.0–34.0)
MCHC: 31.4 g/dL (ref 30.0–36.0)
MCV: 92.8 fL (ref 80.0–100.0)
Monocytes Absolute: 0.4 10*3/uL (ref 0.1–1.0)
Monocytes Relative: 8 %
Neutro Abs: 3.5 10*3/uL (ref 1.7–7.7)
Neutrophils Relative %: 69 %
Platelet Count: 119 10*3/uL — ABNORMAL LOW (ref 150–400)
RBC: 4.02 MIL/uL (ref 3.87–5.11)
RDW: 13.9 % (ref 11.5–15.5)
WBC Count: 5 10*3/uL (ref 4.0–10.5)
nRBC: 0 % (ref 0.0–0.2)

## 2018-09-07 LAB — TSH: TSH: 2.486 u[IU]/mL (ref 0.308–3.960)

## 2018-09-07 MED ORDER — HEPARIN SOD (PORK) LOCK FLUSH 100 UNIT/ML IV SOLN
500.0000 [IU] | Freq: Once | INTRAVENOUS | Status: AC | PRN
  Administered 2018-09-07: 500 [IU]
  Filled 2018-09-07: qty 5

## 2018-09-07 MED ORDER — SODIUM CHLORIDE 0.9 % IV SOLN
Freq: Once | INTRAVENOUS | Status: AC
  Administered 2018-09-07: 12:00:00 via INTRAVENOUS
  Filled 2018-09-07: qty 250

## 2018-09-07 MED ORDER — SODIUM CHLORIDE 0.9% FLUSH
10.0000 mL | INTRAVENOUS | Status: DC | PRN
  Administered 2018-09-07: 10 mL via INTRAVENOUS
  Filled 2018-09-07: qty 10

## 2018-09-07 MED ORDER — SODIUM CHLORIDE 0.9 % IV SOLN
10.0000 mg/kg | Freq: Once | INTRAVENOUS | Status: AC
  Administered 2018-09-07: 740 mg via INTRAVENOUS
  Filled 2018-09-07: qty 10

## 2018-09-07 MED ORDER — SODIUM CHLORIDE 0.9% FLUSH
10.0000 mL | INTRAVENOUS | Status: DC | PRN
  Administered 2018-09-07: 14:00:00 10 mL
  Filled 2018-09-07: qty 10

## 2018-09-07 NOTE — Patient Instructions (Signed)
[facility] Discharge Instructions for Patients Receiving Chemotherapy  Today you received the following chemotherapy agents: Imfinzi.  To help prevent nausea and vomiting after your treatment, we encourage you to take your nausea medication as directed.   If you develop nausea and vomiting that is not controlled by your nausea medication, call the clinic.   BELOW ARE SYMPTOMS THAT SHOULD BE REPORTED IMMEDIATELY:  *FEVER GREATER THAN 100.5 F  *CHILLS WITH OR WITHOUT FEVER  NAUSEA AND VOMITING THAT IS NOT CONTROLLED WITH YOUR NAUSEA MEDICATION  *UNUSUAL SHORTNESS OF BREATH  *UNUSUAL BRUISING OR BLEEDING  TENDERNESS IN MOUTH AND THROAT WITH OR WITHOUT PRESENCE OF ULCERS  *URINARY PROBLEMS  *BOWEL PROBLEMS  UNUSUAL RASH Items with * indicate a potential emergency and should be followed up as soon as possible.  Feel free to call the clinic should you have any questions or concerns. The clinic phone number is [phone].  Please show the CHEMO ALERT CARD at check-in to the Emergency Department and triage nurse.   

## 2018-09-07 NOTE — Progress Notes (Signed)
Ok to treat per Dr. Julien Nordmann with Scr >1.5.  Jalene Mullet, PharmD PGY2 Hematology/ Oncology Pharmacy Resident 09/07/2018 11:43 AM

## 2018-09-07 NOTE — Progress Notes (Signed)
[place] [phone]   [phone]  OFFICE PROGRESS NOTE  [name], MD [place] [address]  DIAGNOSIS: Stage IIIB (T3, N3, M0) non-small cell lung cancer, squamous cell carcinoma presented with large right middle lobe lung mass in addition to right hilar, subcarinal and right supraclavicular lymphadenopathy diagnosed in August 2019.  PRIOR THERAPY: Concurrent chemoradiation with weekly carboplatin for AUC of 2 and paclitaxel 45 NG/M2.  Status post 6 cycles with partial response.   CURRENT THERAPY: Consolidation immunotherapy with Imfinzi 10 mg/KG every 2 weeks started February 28, 2018.  Status post 13 cycles.  INTERVAL HISTORY: Lisa [name] 85 y.o. female returns to the clinic today for follow-up visit.  The patient is has been tolerating consolidation treatment with Imfinzi fairly well.  She denied having any chest pain, shortness of breath, cough or hemoptysis.  She denied having any fever or chills.  She has no nausea, vomiting, diarrhea or constipation.  She is here today for evaluation before starting cycle #14.  MEDICAL HISTORY: Past Medical History:  Diagnosis Date  . Diverticulosis   . Headache   . Hemorrhoids    internal and external  . Hyperlipidemia 01/29/1991  . Hypertension 10/29/1995  . Hypothyroidism 10/29/1995  . Lung mass    right middle lobe  . Melanoma ([place])    h/o, local excision. no chemo or rady.   . Skin cancer (melanoma) ([place])   . Tubular adenoma of colon   . Wears dentures     ALLERGIES:  is allergic to atorvastatin; celebrex [celecoxib]; cholecalciferol; and simvastatin.  MEDICATIONS:  Current Outpatient Medications  Medication Sig Dispense Refill  . amLODipine (NORVASC) 10 MG tablet Take 1 tablet by mouth once daily 90 tablet 0  . Cholecalciferol (VITAMIN D) 2000 units tablet Take 2,000 Units by mouth 2 (two) times daily.    . Coenzyme Q10 100 MG capsule Take 100 mg by  mouth daily.     Marland Kitchen docusate sodium (COLACE) 100 MG capsule Take 100 mg by mouth daily as needed for mild constipation.    . fluticasone (FLONASE) 50 MCG/ACT nasal spray USE 2 SPRAYS IN EACH NOSTRIL DAILY AS NEEDED FOR ALLERGIES 48 g 3  . levothyroxine (SYNTHROID, LEVOTHROID) 75 MCG tablet Take 1 tablet by mouth once daily 90 tablet 3  . lidocaine-prilocaine (EMLA) cream Apply 1 application topically as needed. 30 g 0  . loratadine (CLARITIN) 10 MG tablet Take 1 tablet (10 mg total) by mouth daily as needed for allergies or rhinitis. (Patient not taking: Reported on 08/11/2018)    . mirtazapine (REMERON) 7.5 MG tablet Take 1 tablet (7.5 mg total) by mouth at bedtime. 30 tablet 5  . potassium chloride SA (K-DUR,KLOR-CON) 20 MEQ tablet Take 1 tablet (20 mEq total) by mouth 2 (two) times daily. 30 tablet 0  . prochlorperazine (COMPAZINE) 10 MG tablet Take 1 tablet (10 mg total) by mouth every 6 (six) hours as needed for nausea or vomiting. (Patient not taking: Reported on 08/25/2018) 30 tablet 0  . rivaroxaban (XARELTO) 20 MG TABS tablet Take 1 tablet (20 mg total) by mouth daily with supper. 30 tablet 1  . sennosides-docusate sodium (SENOKOT-S) 8.6-50 MG tablet Take 1 tablet by mouth daily as needed for constipation.     No current facility-administered medications for this visit.    Facility-Administered Medications Ordered in Other Visits  Medication Dose Route Frequency Provider Last Rate Last Dose  . acetaminophen (  TYLENOL) tablet 650 mg  650 mg Oral Once Nicholas Lose, MD        SURGICAL HISTORY:  Past Surgical History:  Procedure Laterality Date  . BRONCHIAL BIOPSY  11/23/2017   Procedure: BRONCHIAL BIOPSIES;  Surgeon: Grace Isaac, MD;  Location: [facility] OR;  Service: Thoracic;;  . cataract surgery  2007, 2008   repair bilat  . DG BARIUM ENEMA ([place] HX) N/A 2016   [name]  . HERNIA REPAIR  04/25/2001  . IR IMAGING GUIDED PORT INSERTION  07/05/2018  . IR US GUIDE BX ASP/DRAIN   11/17/2017  . KNEE SURGERY     meniscus tear per Dr. Ronnie Derby, R knee  . MULTIPLE TOOTH EXTRACTIONS    . SEPTOPLASTY  2006   Dr. Ernesto Rutherford  . SKIN CANCER EXCISION    . TONSILLECTOMY  1954  . VAGINAL HYSTERECTOMY    . VIDEO BRONCHOSCOPY WITH ENDOBRONCHIAL ULTRASOUND N/A 11/23/2017   Procedure: VIDEO BRONCHOSCOPY WITH ENDOBRONCHIAL ULTRASOUND;  Surgeon: Grace Isaac, MD;  Location: [place];  Service: Thoracic;  Laterality: N/A;    REVIEW OF SYSTEMS:  A comprehensive review of systems was negative except for: Constitutional: positive for fatigue   PHYSICAL EXAMINATION: General appearance: alert, cooperative, fatigued and no distress Head: Normocephalic, without obvious abnormality, atraumatic Neck: no adenopathy, no JVD, supple, symmetrical, trachea midline and thyroid not enlarged, symmetric, no tenderness/mass/nodules Lymph nodes: Cervical, supraclavicular, and axillary nodes normal. Resp: clear to auscultation bilaterally Back: symmetric, no curvature. ROM normal. No CVA tenderness. Cardio: regular rate and rhythm, S1, S2 normal, no murmur, click, rub or gallop GI: soft, non-tender; bowel sounds normal; no masses,  no organomegaly Extremities: extremities normal, atraumatic, no cyanosis or edema  ECOG PERFORMANCE STATUS: 1 - Symptomatic but completely ambulatory  Blood pressure 122/71, pulse 92, temperature 98.3 F (36.8 C), temperature source Oral, resp. rate 18, height 5\' 8"  (1.727 m), weight 155 lb 12.8 oz (70.7 kg), SpO2 97 %.  LABORATORY DATA: Lab Results  Component Value Date   WBC 5.0 09/07/2018   HGB 11.7 (L) 09/07/2018   HCT 37.3 09/07/2018   MCV 92.8 09/07/2018   PLT 119 (L) 09/07/2018      Chemistry      Component Value Date/Time   NA 141 08/25/2018 1400   K 4.0 08/25/2018 1400   CL 107 08/25/2018 1400   CO2 26 08/25/2018 1400   BUN 24 (H) 08/25/2018 1400   CREATININE 1.53 (H) 08/25/2018 1400      Component Value Date/Time   CALCIUM 9.6 08/25/2018 1400    ALKPHOS 72 08/25/2018 1400   AST 20 08/25/2018 1400   ALT 10 08/25/2018 1400   BILITOT 0.4 08/25/2018 1400       RADIOGRAPHIC STUDIES: Ct Chest Wo Contrast  Result Date: 08/23/2018 CLINICAL DATA:  Right lung cancer, s/p chemo XRT, ongoing immunotherapy EXAM: CT CHEST WITHOUT CONTRAST TECHNIQUE: Multidetector CT imaging of the chest was performed following the standard protocol without IV contrast. COMPARISON:  05/23/2018, 02/14/2018 FINDINGS: Cardiovascular: Aortic atherosclerosis. No change in enlargement of the tubular thoracic aorta measuring 4.8 x 4.6 cm. Normal heart size. Trace pericardial effusion. Mediastinum/Nodes: Prominent mediastinal lymph nodes are stable or slightly decreased in size. Thyroid gland, trachea, and esophagus demonstrate no significant findings. Lungs/Pleura: Interval increase in postradiation fibrotic consolidation of the perihilar right lung, generally obscuring a primary right middle lobe malignancy (series 5, image 80). Unchanged small right pleural effusion with associated atelectasis or consolidation. Upper Abdomen: No acute abnormality. Musculoskeletal: No  chest wall mass or suspicious bone lesions identified. IMPRESSION: 1. Interval increase in postradiation fibrotic consolidation of the perihilar right lung, generally obscuring a primary right middle lobe malignancy (series 5, image 80). No evidence of disease progression. Attention on follow-up. 2. Unchanged small right pleural effusion with associated atelectasis or consolidation. Electronically Signed   By: Eddie Candle M.D.   On: 08/23/2018 16:40    ASSESSMENT AND PLAN: This is a very pleasant 85 years old white female with stage IIIb non-small cell lung cancer, squamous cell carcinoma.  She is currently undergoing a course of concurrent chemoradiation with weekly carboplatin and paclitaxel status post 6 cycles.  She tolerated this treatment well with partial response. She is currently undergoing consolidation  treatment with immunotherapy with Imfinzi status post 13 cycles.   The patient continues to tolerate this treatment well with no concerning adverse effects. I recommended for her to proceed with cycle #15 today as planned. She will come back for follow-up in 3 weeks for evaluation Immediately if she has any concerning symptoms in the The patient voices understanding of current disease status and treatment options and is in agreement with the current care plan. All questions were answered. The patient knows to call the clinic with any problems, questions or concerns. We can certainly see the patient much sooner if necessary.  I spent 10 minutes counseling the patient face to face. The total time spent in the appointment was 15 minutes.  Disclaimer: This note was dictated with voice recognition software. Similar sounding words can inadvertently be transcribed and may not be corrected upon review.

## 2018-09-08 ENCOUNTER — Telehealth: Payer: Self-pay | Admitting: Internal Medicine

## 2018-09-08 NOTE — Telephone Encounter (Signed)
Added additional cycle per 6/10 los - pt to get an updated schedule next visit.

## 2018-09-19 ENCOUNTER — Other Ambulatory Visit: Payer: Self-pay | Admitting: Family Medicine

## 2018-09-20 NOTE — Telephone Encounter (Signed)
Electronic refill request. Xarelto Last office visit:   08/02/2018 Acute Last Filled:    30 tablet 1 07/27/2018  Please advise.

## 2018-09-21 ENCOUNTER — Inpatient Hospital Stay: Payer: Medicare Other

## 2018-09-21 ENCOUNTER — Other Ambulatory Visit: Payer: Self-pay

## 2018-09-21 ENCOUNTER — Inpatient Hospital Stay (HOSPITAL_BASED_OUTPATIENT_CLINIC_OR_DEPARTMENT_OTHER): Payer: Medicare Other | Admitting: Physician Assistant

## 2018-09-21 VITALS — BP 132/76 | HR 86 | Temp 99.1°F | Resp 16 | Ht 68.0 in | Wt 152.7 lb

## 2018-09-21 DIAGNOSIS — Z7901 Long term (current) use of anticoagulants: Secondary | ICD-10-CM

## 2018-09-21 DIAGNOSIS — Z9221 Personal history of antineoplastic chemotherapy: Secondary | ICD-10-CM

## 2018-09-21 DIAGNOSIS — R5383 Other fatigue: Secondary | ICD-10-CM | POA: Diagnosis not present

## 2018-09-21 DIAGNOSIS — C3491 Malignant neoplasm of unspecified part of right bronchus or lung: Secondary | ICD-10-CM

## 2018-09-21 DIAGNOSIS — Z7951 Long term (current) use of inhaled steroids: Secondary | ICD-10-CM

## 2018-09-21 DIAGNOSIS — E785 Hyperlipidemia, unspecified: Secondary | ICD-10-CM | POA: Diagnosis not present

## 2018-09-21 DIAGNOSIS — C770 Secondary and unspecified malignant neoplasm of lymph nodes of head, face and neck: Secondary | ICD-10-CM | POA: Diagnosis not present

## 2018-09-21 DIAGNOSIS — I10 Essential (primary) hypertension: Secondary | ICD-10-CM | POA: Diagnosis not present

## 2018-09-21 DIAGNOSIS — Z5112 Encounter for antineoplastic immunotherapy: Secondary | ICD-10-CM

## 2018-09-21 DIAGNOSIS — Z95828 Presence of other vascular implants and grafts: Secondary | ICD-10-CM

## 2018-09-21 DIAGNOSIS — E039 Hypothyroidism, unspecified: Secondary | ICD-10-CM | POA: Diagnosis not present

## 2018-09-21 DIAGNOSIS — C342 Malignant neoplasm of middle lobe, bronchus or lung: Secondary | ICD-10-CM | POA: Diagnosis not present

## 2018-09-21 DIAGNOSIS — Z85820 Personal history of malignant melanoma of skin: Secondary | ICD-10-CM

## 2018-09-21 DIAGNOSIS — Z923 Personal history of irradiation: Secondary | ICD-10-CM

## 2018-09-21 DIAGNOSIS — Z79899 Other long term (current) drug therapy: Secondary | ICD-10-CM | POA: Diagnosis not present

## 2018-09-21 LAB — CMP (CANCER CENTER ONLY)
ALT: 14 U/L (ref 0–44)
AST: 24 U/L (ref 15–41)
Albumin: 3.6 g/dL (ref 3.5–5.0)
Alkaline Phosphatase: 71 U/L (ref 38–126)
Anion gap: 9 (ref 5–15)
BUN: 33 mg/dL — ABNORMAL HIGH (ref 8–23)
CO2: 25 mmol/L (ref 22–32)
Calcium: 9.5 mg/dL (ref 8.9–10.3)
Chloride: 109 mmol/L (ref 98–111)
Creatinine: 1.62 mg/dL — ABNORMAL HIGH (ref 0.44–1.00)
GFR, Est AFR Am: 33 mL/min — ABNORMAL LOW (ref >60)
GFR, Estimated: 29 mL/min — ABNORMAL LOW (ref >60)
Glucose, Bld: 97 mg/dL (ref 70–99)
Potassium: 3.9 mmol/L (ref 3.5–5.1)
Sodium: 143 mmol/L (ref 135–145)
Total Bilirubin: 0.4 mg/dL (ref 0.3–1.2)
Total Protein: 6.8 g/dL (ref 6.5–8.1)

## 2018-09-21 LAB — CBC WITH DIFFERENTIAL (CANCER CENTER ONLY)
Abs Immature Granulocytes: 0.01 10*3/uL (ref 0.00–0.07)
Basophils Absolute: 0 10*3/uL (ref 0.0–0.1)
Basophils Relative: 1 %
Eosinophils Absolute: 0.1 10*3/uL (ref 0.0–0.5)
Eosinophils Relative: 2 %
HCT: 36.7 % (ref 36.0–46.0)
Hemoglobin: 11.6 g/dL — ABNORMAL LOW (ref 12.0–15.0)
Immature Granulocytes: 0 %
Lymphocytes Relative: 22 %
Lymphs Abs: 1 10*3/uL (ref 0.7–4.0)
MCH: 29.2 pg (ref 26.0–34.0)
MCHC: 31.6 g/dL (ref 30.0–36.0)
MCV: 92.4 fL (ref 80.0–100.0)
Monocytes Absolute: 0.3 10*3/uL (ref 0.1–1.0)
Monocytes Relative: 7 %
Neutro Abs: 3.3 10*3/uL (ref 1.7–7.7)
Neutrophils Relative %: 68 %
Platelet Count: 125 10*3/uL — ABNORMAL LOW (ref 150–400)
RBC: 3.97 MIL/uL (ref 3.87–5.11)
RDW: 13.7 % (ref 11.5–15.5)
WBC Count: 4.8 10*3/uL (ref 4.0–10.5)
nRBC: 0 % (ref 0.0–0.2)

## 2018-09-21 MED ORDER — HEPARIN SOD (PORK) LOCK FLUSH 100 UNIT/ML IV SOLN
500.0000 [IU] | Freq: Once | INTRAVENOUS | Status: AC | PRN
  Administered 2018-09-21: 16:00:00 500 [IU]
  Filled 2018-09-21: qty 5

## 2018-09-21 MED ORDER — SODIUM CHLORIDE 0.9% FLUSH
10.0000 mL | Freq: Once | INTRAVENOUS | Status: AC
  Administered 2018-09-21: 10 mL
  Filled 2018-09-21: qty 10

## 2018-09-21 MED ORDER — SODIUM CHLORIDE 0.9 % IV SOLN
Freq: Once | INTRAVENOUS | Status: AC
  Administered 2018-09-21: 14:00:00 via INTRAVENOUS
  Filled 2018-09-21: qty 250

## 2018-09-21 MED ORDER — SODIUM CHLORIDE 0.9 % IV SOLN
10.0000 mg/kg | Freq: Once | INTRAVENOUS | Status: AC
  Administered 2018-09-21: 740 mg via INTRAVENOUS
  Filled 2018-09-21: qty 10

## 2018-09-21 MED ORDER — SODIUM CHLORIDE 0.9% FLUSH
10.0000 mL | INTRAVENOUS | Status: DC | PRN
  Administered 2018-09-21: 10 mL
  Filled 2018-09-21: qty 10

## 2018-09-21 NOTE — Patient Instructions (Signed)
[facility] Discharge Instructions for Patients Receiving Chemotherapy  Today you received the following chemotherapy agents Durvalumab (IMFINZI).  To help prevent nausea and vomiting after your treatment, we encourage you to take your nausea medication as prescribed.   If you develop nausea and vomiting that is not controlled by your nausea medication, call the clinic.   BELOW ARE SYMPTOMS THAT SHOULD BE REPORTED IMMEDIATELY:  *FEVER GREATER THAN 100.5 F  *CHILLS WITH OR WITHOUT FEVER  NAUSEA AND VOMITING THAT IS NOT CONTROLLED WITH YOUR NAUSEA MEDICATION  *UNUSUAL SHORTNESS OF BREATH  *UNUSUAL BRUISING OR BLEEDING  TENDERNESS IN MOUTH AND THROAT WITH OR WITHOUT PRESENCE OF ULCERS  *URINARY PROBLEMS  *BOWEL PROBLEMS  UNUSUAL RASH Items with * indicate a potential emergency and should be followed up as soon as possible.  Feel free to call the clinic should you have any questions or concerns. The clinic phone number is [phone].  Please show the [facility] at check-in to the Emergency Department and triage nurse.  Coronavirus (COVID-19) Are you at risk?  Are you at risk for the Coronavirus (COVID-19)?  To be considered HIGH RISK for Coronavirus (COVID-19), you have to meet the following criteria:  . Traveled to [place], [place], [place], [place] or [place]; or in the [place] to [place], [place], [place], or [place]; and have fever, cough, and shortness of breath within the last 2 weeks of travel OR . Been in close contact with a person diagnosed with COVID-19 within the last 2 weeks and have fever, cough, and shortness of breath . IF YOU DO NOT MEET THESE CRITERIA, YOU ARE CONSIDERED LOW RISK FOR COVID-19.  What to do if you are HIGH RISK for COVID-19?  Marland Kitchen If you are having a medical emergency, call 911. . Seek medical care right away. Before you go to a doctor's office, urgent care or emergency department, call ahead and tell them  about your recent travel, contact with someone diagnosed with COVID-19, and your symptoms. You should receive instructions from your physician's office regarding next steps of care.  . When you arrive at healthcare provider, tell the healthcare staff immediately you have returned from visiting [place], [place], [place], [place] or [place]; or traveled in the [place] to [place], [place], [place], or [place]; in the last two weeks or you have been in close contact with a person diagnosed with COVID-19 in the last 2 weeks.   . Tell the health care staff about your symptoms: fever, cough and shortness of breath. . After you have been seen by a medical provider, you will be either: o Tested for (COVID-19) and discharged home on quarantine except to seek medical care if symptoms worsen, and asked to  - Stay home and avoid contact with others until you get your results (4-5 days)  - Avoid travel on public transportation if possible (such as bus, train, or airplane) or o Sent to the Emergency Department by EMS for evaluation, COVID-19 testing, and possible admission depending on your condition and test results.  What to do if you are LOW RISK for COVID-19?  Reduce your risk of any infection by using the same precautions used for avoiding the common cold or flu:  Marland Kitchen Wash your hands often with soap and warm water for at least 20 seconds.  If soap and water are not readily available, use an alcohol-based hand sanitizer with at least 60% alcohol.  . If coughing or  sneezing, cover your mouth and nose by coughing or sneezing into the elbow areas of your shirt or coat, into a tissue or into your sleeve (not your hands). . Avoid shaking hands with others and consider head nods or verbal greetings only. . Avoid touching your eyes, nose, or mouth with unwashed hands.  . Avoid close contact with people who are sick. . Avoid places or events with large numbers of people in one location, like concerts or  sporting events. . Carefully consider travel plans you have or are making. . If you are planning any travel outside or inside the [place], visit the CDC's Travelers' Health webpage for the latest health notices. . If you have some symptoms but not all symptoms, continue to monitor at home and seek medical attention if your symptoms worsen. . If you are having a medical emergency, call 911.   [facility] / e-Visit: [email or web address]         [facility]: [facility]: [phone]                   [facility]: [phone]

## 2018-09-21 NOTE — Progress Notes (Signed)
[facility] OFFICE PROGRESS NOTE  [name], MD [place] [address]  DIAGNOSIS: Stage IIIB (T3, N3, M0) non-small cell lung cancer, squamous cell carcinoma presented with large right middle lobe lung mass in addition to right hilar, subcarinal and right supraclavicular lymphadenopathy diagnosed in August 2019  PRIOR THERAPY: Concurrent chemoradiation with weekly carboplatin for AUC of 2 and paclitaxel 45 NG/M2. Status post 6 cycles with partial response.  CURRENT THERAPY: Consolidation immunotherapy with Imfinzi 10 mg/KG every 2 weeks started February 28, 2018. Status post 13cycles.  INTERVAL HISTORY: NAESHA BUCKALEW 85 y.o. female returns to the clinic for a follow-up visit.  The patient is feeling well today without any concerning complaints.  She continues to tolerate her treatment well without any adverse effects.  She denies any fever, chills, night sweats, or weight loss.  She denies any chest pain, shortness of breath, cough, or hemoptysis.  She denies any nausea, vomiting, diarrhea, or constipation.  She denies any headache or visual changes.  She denies any rashes or skin changes.  She is here today for evaluation for starting cycle #14.  MEDICAL HISTORY: Past Medical History:  Diagnosis Date  . Diverticulosis   . Headache   . Hemorrhoids    internal and external  . Hyperlipidemia 01/29/1991  . Hypertension 10/29/1995  . Hypothyroidism 10/29/1995  . Lung mass    right middle lobe  . Melanoma ([facility])    h/o, local excision. no chemo or rady.   . Skin cancer (melanoma) ([facility])   . Tubular adenoma of colon   . Wears dentures     ALLERGIES:  is allergic to atorvastatin; celebrex [celecoxib]; cholecalciferol; and simvastatin.  MEDICATIONS:  Current Outpatient Medications  Medication Sig Dispense Refill  . amLODipine (NORVASC) 10 MG tablet Take 1 tablet by mouth once daily 90 tablet 0  . Cholecalciferol (VITAMIN D) 2000 units tablet  Take 2,000 Units by mouth 2 (two) times daily.    . Coenzyme Q10 100 MG capsule Take 100 mg by mouth daily.     Marland Kitchen docusate sodium (COLACE) 100 MG capsule Take 100 mg by mouth daily as needed for mild constipation.    . fluticasone (FLONASE) 50 MCG/ACT nasal spray USE 2 SPRAYS IN EACH NOSTRIL DAILY AS NEEDED FOR ALLERGIES 48 g 3  . levothyroxine (SYNTHROID, LEVOTHROID) 75 MCG tablet Take 1 tablet by mouth once daily 90 tablet 3  . lidocaine-prilocaine (EMLA) cream Apply 1 application topically as needed. 30 g 0  . mirtazapine (REMERON) 7.5 MG tablet Take 1 tablet (7.5 mg total) by mouth at bedtime. 30 tablet 5  . potassium chloride SA (K-DUR,KLOR-CON) 20 MEQ tablet Take 1 tablet (20 mEq total) by mouth 2 (two) times daily. 30 tablet 0  . rivaroxaban (XARELTO) 20 MG TABS tablet Take 1 tablet (20 mg total) by mouth daily with supper. 30 tablet 1  . sennosides-docusate sodium (SENOKOT-S) 8.6-50 MG tablet Take 1 tablet by mouth daily as needed for constipation.    Marland Kitchen loratadine (CLARITIN) 10 MG tablet Take 1 tablet (10 mg total) by mouth daily as needed for allergies or rhinitis. (Patient not taking: Reported on 08/11/2018)    . prochlorperazine (COMPAZINE) 10 MG tablet Take 1 tablet (10 mg total) by mouth every 6 (six) hours as needed for nausea or vomiting. (Patient not taking: Reported on 08/25/2018) 30 tablet 0   No current facility-administered medications for this visit.    Facility-Administered Medications Ordered in Other Visits  Medication Dose Route Frequency Provider Last Rate Last Dose  . acetaminophen (TYLENOL) tablet 650 mg  650 mg Oral Once Nicholas Lose, MD        SURGICAL HISTORY:  Past Surgical History:  Procedure Laterality Date  . BRONCHIAL BIOPSY  11/23/2017   Procedure: BRONCHIAL BIOPSIES;  Surgeon: Grace Isaac, MD;  Location: [facility];  Service: Thoracic;;  . cataract surgery  2007, 2008   repair bilat  . DG BARIUM ENEMA ([place] HX) N/A 2016   [name]  . HERNIA REPAIR   04/25/2001  . IR IMAGING GUIDED PORT INSERTION  07/05/2018  . IR US GUIDE BX ASP/DRAIN  11/17/2017  . KNEE SURGERY     meniscus tear per Dr. Ronnie Derby, R knee  . MULTIPLE TOOTH EXTRACTIONS    . SEPTOPLASTY  2006   Dr. Ernesto Rutherford  . SKIN CANCER EXCISION    . TONSILLECTOMY  1954  . VAGINAL HYSTERECTOMY    . VIDEO BRONCHOSCOPY WITH ENDOBRONCHIAL ULTRASOUND N/A 11/23/2017   Procedure: VIDEO BRONCHOSCOPY WITH ENDOBRONCHIAL ULTRASOUND;  Surgeon: Grace Isaac, MD;  Location: [facility];  Service: Thoracic;  Laterality: N/A;    REVIEW OF SYSTEMS:   Review of Systems  Constitutional: Negative for appetite change, chills, fatigue, fever and unexpected weight change.  HENT:   Negative for mouth sores, nosebleeds, sore throat and trouble swallowing.   Eyes: Negative for eye problems and icterus.  Respiratory: Negative for cough, hemoptysis, shortness of breath and wheezing.   Cardiovascular: Negative for chest pain and leg swelling.  Gastrointestinal: Negative for abdominal pain, constipation, diarrhea, nausea and vomiting.  Genitourinary: Negative for bladder incontinence, difficulty urinating, dysuria, frequency and hematuria.   Musculoskeletal: Negative for back pain, gait problem, neck pain and neck stiffness.  Skin: Negative for itching and rash.  Neurological: Negative for dizziness, extremity weakness, gait problem, headaches, light-headedness and seizures.  Hematological: Negative for adenopathy. Does not bruise/bleed easily.  Psychiatric/Behavioral: Negative for confusion, depression and sleep disturbance. The patient is not nervous/anxious.     PHYSICAL EXAMINATION:  Blood pressure 132/76, pulse 86, temperature 99.1 F (37.3 C), temperature source Oral, resp. rate 16, height 5\' 8"  (1.727 m), weight 152 lb 11.2 oz (69.3 kg), SpO2 98 %.  ECOG PERFORMANCE STATUS: 1 - Symptomatic but completely ambulatory  Physical Exam  Constitutional: Oriented to person, place, and time and well-developed,  well-nourished, and in no distress.   HENT:  Head: Normocephalic and atraumatic.  Mouth/Throat: Oropharynx is clear and moist. No oropharyngeal exudate.  Eyes: Conjunctivae are normal. Right eye exhibits no discharge. Left eye exhibits no discharge. No scleral icterus.  Neck: Normal range of motion. Neck supple.  Cardiovascular: Normal rate, regular rhythm, normal heart sounds and intact distal pulses.   Pulmonary/Chest: Effort normal and breath sounds normal. No respiratory distress. No wheezes. No rales.  Abdominal: Soft. Bowel sounds are normal. Exhibits no distension and no mass. There is no tenderness.  Musculoskeletal: Normal range of motion. Exhibits no edema.  Lymphadenopathy:    No cervical adenopathy.  Neurological: Alert and oriented to person, place, and time. Exhibits normal muscle tone. Gait normal. Coordination normal.  Skin: Skin is warm and dry. No rash noted. Not diaphoretic. No erythema. No pallor.  Psychiatric: Mood, memory and judgment normal.  Vitals reviewed.  LABORATORY DATA: Lab Results  Component Value Date   WBC 4.8 09/21/2018   HGB 11.6 (L) 09/21/2018   HCT 36.7 09/21/2018   MCV 92.4 09/21/2018   PLT 125 (L) 09/21/2018  Chemistry      Component Value Date/Time   NA 143 09/07/2018 1050   K 3.9 09/07/2018 1050   CL 111 09/07/2018 1050   CO2 24 09/07/2018 1050   BUN 30 (H) 09/07/2018 1050   CREATININE 1.52 (H) 09/07/2018 1050      Component Value Date/Time   CALCIUM 8.6 (L) 09/07/2018 1050   ALKPHOS 71 09/07/2018 1050   AST 20 09/07/2018 1050   ALT 12 09/07/2018 1050   BILITOT 0.4 09/07/2018 1050       RADIOGRAPHIC STUDIES:  Ct Chest Wo Contrast  Result Date: 08/23/2018 CLINICAL DATA:  Right lung cancer, s/p chemo XRT, ongoing immunotherapy EXAM: CT CHEST WITHOUT CONTRAST TECHNIQUE: Multidetector CT imaging of the chest was performed following the standard protocol without IV contrast. COMPARISON:  05/23/2018, 02/14/2018 FINDINGS:  Cardiovascular: Aortic atherosclerosis. No change in enlargement of the tubular thoracic aorta measuring 4.8 x 4.6 cm. Normal heart size. Trace pericardial effusion. Mediastinum/Nodes: Prominent mediastinal lymph nodes are stable or slightly decreased in size. Thyroid gland, trachea, and esophagus demonstrate no significant findings. Lungs/Pleura: Interval increase in postradiation fibrotic consolidation of the perihilar right lung, generally obscuring a primary right middle lobe malignancy (series 5, image 80). Unchanged small right pleural effusion with associated atelectasis or consolidation. Upper Abdomen: No acute abnormality. Musculoskeletal: No chest wall mass or suspicious bone lesions identified. IMPRESSION: 1. Interval increase in postradiation fibrotic consolidation of the perihilar right lung, generally obscuring a primary right middle lobe malignancy (series 5, image 80). No evidence of disease progression. Attention on follow-up. 2. Unchanged small right pleural effusion with associated atelectasis or consolidation. Electronically Signed   By: Eddie Candle M.D.   On: 08/23/2018 16:40     ASSESSMENT/PLAN:  This is a very pleasant 85 year old Caucasian female with stage IIIb non-small cell lung cancer, squamous cell carcinoma.  She presented with a large right lower lobe lung mass in addition to right hilar, subcarinal, and left supraclavicular lymphadenopathy.  She was diagnosed in August 2019.  She completed a course of concurrent chemoradiation with carboplatin and paclitaxel.  She is status post 6 cycles with partial response.  She is currently undergoing consolidation immunotherapy with Imfinzi 10 mg/kg IV every 2 weeks.  She is status post 13 cycles.  She is tolerating treatment well without any adverse side effects.  Labs were reviewed with the patient.  I recommend that she proceed with cycle #14 today as scheduled.  I will see her back for follow-up visit in 2 weeks for evaluation  before starting cycle #15.  The patient was advised to call immediately if she has any concerning symptoms in the interval. The patient voices understanding of current disease status and treatment options and is in agreement with the current care plan. All questions were answered. The patient knows to call the clinic with any problems, questions or concerns. We can certainly see the patient much sooner if necessary  No orders of the defined types were placed in this encounter.    Lanier Felty L Paarth Cropper, PA-C 09/21/18

## 2018-09-21 NOTE — Telephone Encounter (Signed)
I sent refill in the meantime but I would appreciate hematology managing this rx in the future.  Routed to hematology with appreciation.  Thanks.

## 2018-09-21 NOTE — Progress Notes (Signed)
Per Cassie, PA, okay to treat patient with current Scr. of 1.62

## 2018-10-01 ENCOUNTER — Other Ambulatory Visit: Payer: Self-pay | Admitting: Medical

## 2018-10-01 DIAGNOSIS — R630 Anorexia: Secondary | ICD-10-CM

## 2018-10-03 NOTE — Telephone Encounter (Signed)
Dr. Julien Nordmann advise refill.

## 2018-10-05 ENCOUNTER — Encounter: Payer: Self-pay | Admitting: Internal Medicine

## 2018-10-05 ENCOUNTER — Inpatient Hospital Stay (HOSPITAL_BASED_OUTPATIENT_CLINIC_OR_DEPARTMENT_OTHER): Payer: Medicare Other | Admitting: Internal Medicine

## 2018-10-05 ENCOUNTER — Inpatient Hospital Stay: Payer: Medicare Other | Attending: Internal Medicine

## 2018-10-05 ENCOUNTER — Telehealth: Payer: Self-pay | Admitting: Internal Medicine

## 2018-10-05 ENCOUNTER — Other Ambulatory Visit: Payer: Self-pay

## 2018-10-05 ENCOUNTER — Inpatient Hospital Stay: Payer: Medicare Other

## 2018-10-05 VITALS — BP 138/82 | HR 82 | Temp 98.7°F | Resp 20 | Ht 68.0 in | Wt 152.9 lb

## 2018-10-05 DIAGNOSIS — Z5112 Encounter for antineoplastic immunotherapy: Secondary | ICD-10-CM | POA: Insufficient documentation

## 2018-10-05 DIAGNOSIS — Z7901 Long term (current) use of anticoagulants: Secondary | ICD-10-CM

## 2018-10-05 DIAGNOSIS — Z9221 Personal history of antineoplastic chemotherapy: Secondary | ICD-10-CM

## 2018-10-05 DIAGNOSIS — C342 Malignant neoplasm of middle lobe, bronchus or lung: Secondary | ICD-10-CM

## 2018-10-05 DIAGNOSIS — I10 Essential (primary) hypertension: Secondary | ICD-10-CM | POA: Diagnosis not present

## 2018-10-05 DIAGNOSIS — Z923 Personal history of irradiation: Secondary | ICD-10-CM

## 2018-10-05 DIAGNOSIS — E039 Hypothyroidism, unspecified: Secondary | ICD-10-CM | POA: Insufficient documentation

## 2018-10-05 DIAGNOSIS — Z85820 Personal history of malignant melanoma of skin: Secondary | ICD-10-CM | POA: Diagnosis not present

## 2018-10-05 DIAGNOSIS — C3491 Malignant neoplasm of unspecified part of right bronchus or lung: Secondary | ICD-10-CM

## 2018-10-05 DIAGNOSIS — E785 Hyperlipidemia, unspecified: Secondary | ICD-10-CM | POA: Insufficient documentation

## 2018-10-05 DIAGNOSIS — C770 Secondary and unspecified malignant neoplasm of lymph nodes of head, face and neck: Secondary | ICD-10-CM

## 2018-10-05 DIAGNOSIS — Z95828 Presence of other vascular implants and grafts: Secondary | ICD-10-CM

## 2018-10-05 DIAGNOSIS — Z79899 Other long term (current) drug therapy: Secondary | ICD-10-CM | POA: Insufficient documentation

## 2018-10-05 LAB — CBC WITH DIFFERENTIAL (CANCER CENTER ONLY)
Abs Immature Granulocytes: 0.01 10*3/uL (ref 0.00–0.07)
Basophils Absolute: 0 10*3/uL (ref 0.0–0.1)
Basophils Relative: 1 %
Eosinophils Absolute: 0.1 10*3/uL (ref 0.0–0.5)
Eosinophils Relative: 2 %
HCT: 37.5 % (ref 36.0–46.0)
Hemoglobin: 11.8 g/dL — ABNORMAL LOW (ref 12.0–15.0)
Immature Granulocytes: 0 %
Lymphocytes Relative: 22 %
Lymphs Abs: 1 10*3/uL (ref 0.7–4.0)
MCH: 29.6 pg (ref 26.0–34.0)
MCHC: 31.5 g/dL (ref 30.0–36.0)
MCV: 94 fL (ref 80.0–100.0)
Monocytes Absolute: 0.4 10*3/uL (ref 0.1–1.0)
Monocytes Relative: 9 %
Neutro Abs: 3 10*3/uL (ref 1.7–7.7)
Neutrophils Relative %: 66 %
Platelet Count: 121 10*3/uL — ABNORMAL LOW (ref 150–400)
RBC: 3.99 MIL/uL (ref 3.87–5.11)
RDW: 13.6 % (ref 11.5–15.5)
WBC Count: 4.6 10*3/uL (ref 4.0–10.5)
nRBC: 0 % (ref 0.0–0.2)

## 2018-10-05 LAB — CMP (CANCER CENTER ONLY)
ALT: 12 U/L (ref 0–44)
AST: 21 U/L (ref 15–41)
Albumin: 3.4 g/dL — ABNORMAL LOW (ref 3.5–5.0)
Alkaline Phosphatase: 76 U/L (ref 38–126)
Anion gap: 9 (ref 5–15)
BUN: 28 mg/dL — ABNORMAL HIGH (ref 8–23)
CO2: 25 mmol/L (ref 22–32)
Calcium: 8.3 mg/dL — ABNORMAL LOW (ref 8.9–10.3)
Chloride: 109 mmol/L (ref 98–111)
Creatinine: 1.57 mg/dL — ABNORMAL HIGH (ref 0.44–1.00)
GFR, Est AFR Am: 35 mL/min — ABNORMAL LOW (ref >60)
GFR, Estimated: 30 mL/min — ABNORMAL LOW (ref >60)
Glucose, Bld: 97 mg/dL (ref 70–99)
Potassium: 3.7 mmol/L (ref 3.5–5.1)
Sodium: 143 mmol/L (ref 135–145)
Total Bilirubin: 0.4 mg/dL (ref 0.3–1.2)
Total Protein: 6.7 g/dL (ref 6.5–8.1)

## 2018-10-05 LAB — TSH: TSH: 4.972 u[IU]/mL — ABNORMAL HIGH (ref 0.308–3.960)

## 2018-10-05 MED ORDER — SODIUM CHLORIDE 0.9% FLUSH
10.0000 mL | INTRAVENOUS | Status: DC | PRN
  Administered 2018-10-05: 10 mL
  Filled 2018-10-05: qty 10

## 2018-10-05 MED ORDER — SODIUM CHLORIDE 0.9 % IV SOLN
Freq: Once | INTRAVENOUS | Status: AC
  Administered 2018-10-05: 11:00:00 via INTRAVENOUS
  Filled 2018-10-05: qty 250

## 2018-10-05 MED ORDER — HEPARIN SOD (PORK) LOCK FLUSH 100 UNIT/ML IV SOLN
500.0000 [IU] | Freq: Once | INTRAVENOUS | Status: AC | PRN
  Administered 2018-10-05: 500 [IU]
  Filled 2018-10-05: qty 5

## 2018-10-05 MED ORDER — SODIUM CHLORIDE 0.9 % IV SOLN
10.0000 mg/kg | Freq: Once | INTRAVENOUS | Status: AC
  Administered 2018-10-05: 740 mg via INTRAVENOUS
  Filled 2018-10-05: qty 10

## 2018-10-05 MED ORDER — SODIUM CHLORIDE 0.9% FLUSH
10.0000 mL | Freq: Once | INTRAVENOUS | Status: AC
  Administered 2018-10-05: 10 mL
  Filled 2018-10-05: qty 10

## 2018-10-05 NOTE — Telephone Encounter (Signed)
Scheduled appt per 7/08 los - pt to get an updated schedule in treatment area.

## 2018-10-05 NOTE — Progress Notes (Signed)
Per Dr. Julien Nordmann, pt OK to treat with today's labs

## 2018-10-05 NOTE — Progress Notes (Signed)
[facility] [phone]   [phone]  OFFICE PROGRESS NOTE  [name], MD [place] [address]  DIAGNOSIS: Stage IIIB (T3, N3, M0) non-small cell lung cancer, squamous cell carcinoma presented with large right middle lobe lung mass in addition to right hilar, subcarinal and right supraclavicular lymphadenopathy diagnosed in August 2019.  PRIOR THERAPY: Concurrent chemoradiation with weekly carboplatin for AUC of 2 and paclitaxel 45 NG/M2.  Status post 6 cycles with partial response.   CURRENT THERAPY: Consolidation immunotherapy with Imfinzi 10 mg/KG every 2 weeks started February 28, 2018.  Status post 15 cycles.  INTERVAL HISTORY: Lisa [name] 85 y.o. female returns to the clinic today for follow-up visit.  The patient is feeling fine today with no concerning complaints.  She continues to tolerate her treatment with Imfinzi fairly well.  She denied having any chest pain, shortness of breath, cough or hemoptysis.  She denied having any fever or chills.  She has no nausea, vomiting, diarrhea or constipation.  She denied having any headache or visual changes.  She is here today for evaluation before starting cycle #16 of her treatment.  MEDICAL HISTORY: Past Medical History:  Diagnosis Date  . Diverticulosis   . Headache   . Hemorrhoids    internal and external  . Hyperlipidemia 01/29/1991  . Hypertension 10/29/1995  . Hypothyroidism 10/29/1995  . Lung mass    right middle lobe  . Melanoma ([facility])    h/o, local excision. no chemo or rady.   . Skin cancer (melanoma) ([facility])   . Tubular adenoma of colon   . Wears dentures     ALLERGIES:  is allergic to atorvastatin; celebrex [celecoxib]; cholecalciferol; and simvastatin.  MEDICATIONS:  Current Outpatient Medications  Medication Sig Dispense Refill  . amLODipine (NORVASC) 10 MG tablet Take 1 tablet by mouth once daily 90 tablet 0  . Cholecalciferol (VITAMIN D)  2000 units tablet Take 2,000 Units by mouth 2 (two) times daily.    . Coenzyme Q10 100 MG capsule Take 100 mg by mouth daily.     Marland Kitchen docusate sodium (COLACE) 100 MG capsule Take 100 mg by mouth daily as needed for mild constipation.    . fluticasone (FLONASE) 50 MCG/ACT nasal spray USE 2 SPRAYS IN EACH NOSTRIL DAILY AS NEEDED FOR ALLERGIES 48 g 3  . levothyroxine (SYNTHROID, LEVOTHROID) 75 MCG tablet Take 1 tablet by mouth once daily 90 tablet 3  . lidocaine-prilocaine (EMLA) cream Apply 1 application topically as needed. 30 g 0  . loratadine (CLARITIN) 10 MG tablet Take 1 tablet (10 mg total) by mouth daily as needed for allergies or rhinitis. (Patient not taking: Reported on 08/11/2018)    . mirtazapine (REMERON) 7.5 MG tablet TAKE 1 TABLET BY MOUTH EVERY DAY AT BEDTIME 30 tablet 0  . potassium chloride SA (K-DUR,KLOR-CON) 20 MEQ tablet Take 1 tablet (20 mEq total) by mouth 2 (two) times daily. 30 tablet 0  . prochlorperazine (COMPAZINE) 10 MG tablet Take 1 tablet (10 mg total) by mouth every 6 (six) hours as needed for nausea or vomiting. (Patient not taking: Reported on 08/25/2018) 30 tablet 0  . sennosides-docusate sodium (SENOKOT-S) 8.6-50 MG tablet Take 1 tablet by mouth daily as needed for constipation.    [name] 20 MG TABS tablet TAKE 1 TABLET BY MOUTH ONCE DAILY WITH  SUPPER 30 tablet 0   No current facility-administered medications for this visit.    Facility-Administered  Medications Ordered in Other Visits  Medication Dose Route Frequency Provider Last Rate Last Dose  . acetaminophen (TYLENOL) tablet 650 mg  650 mg Oral Once Nicholas Lose, MD        SURGICAL HISTORY:  Past Surgical History:  Procedure Laterality Date  . BRONCHIAL BIOPSY  11/23/2017   Procedure: BRONCHIAL BIOPSIES;  Surgeon: Grace Isaac, MD;  Location: [facility] OR;  Service: Thoracic;;  . cataract surgery  2007, 2008   repair bilat  . DG BARIUM ENEMA ([place] HX) N/A 2016   [name]  . HERNIA REPAIR   04/25/2001  . IR IMAGING GUIDED PORT INSERTION  07/05/2018  . IR US GUIDE BX ASP/DRAIN  11/17/2017  . KNEE SURGERY     meniscus tear per Dr. Ronnie Derby, R knee  . MULTIPLE TOOTH EXTRACTIONS    . SEPTOPLASTY  2006   Dr. Ernesto Rutherford  . SKIN CANCER EXCISION    . TONSILLECTOMY  1954  . VAGINAL HYSTERECTOMY    . VIDEO BRONCHOSCOPY WITH ENDOBRONCHIAL ULTRASOUND N/A 11/23/2017   Procedure: VIDEO BRONCHOSCOPY WITH ENDOBRONCHIAL ULTRASOUND;  Surgeon: Grace Isaac, MD;  Location: [facility];  Service: Thoracic;  Laterality: N/A;    REVIEW OF SYSTEMS:  A comprehensive review of systems was negative.   PHYSICAL EXAMINATION: General appearance: alert, cooperative and no distress Head: Normocephalic, without obvious abnormality, atraumatic Neck: no adenopathy, no JVD, supple, symmetrical, trachea midline and thyroid not enlarged, symmetric, no tenderness/mass/nodules Lymph nodes: Cervical, supraclavicular, and axillary nodes normal. Resp: clear to auscultation bilaterally Back: symmetric, no curvature. ROM normal. No CVA tenderness. Cardio: regular rate and rhythm, S1, S2 normal, no murmur, click, rub or gallop GI: soft, non-tender; bowel sounds normal; no masses,  no organomegaly Extremities: extremities normal, atraumatic, no cyanosis or edema  ECOG PERFORMANCE STATUS: 1 - Symptomatic but completely ambulatory  Blood pressure 138/82, pulse 82, temperature 98.7 F (37.1 C), resp. rate 20, height 5\' 8"  (1.727 m), weight 152 lb 14.4 oz (69.4 kg), SpO2 97 %.  LABORATORY DATA: Lab Results  Component Value Date   WBC 4.6 10/05/2018   HGB 11.8 (L) 10/05/2018   HCT 37.5 10/05/2018   MCV 94.0 10/05/2018   PLT 121 (L) 10/05/2018      Chemistry      Component Value Date/Time   NA 143 09/21/2018 1300   K 3.9 09/21/2018 1300   CL 109 09/21/2018 1300   CO2 25 09/21/2018 1300   BUN 33 (H) 09/21/2018 1300   CREATININE 1.62 (H) 09/21/2018 1300      Component Value Date/Time   CALCIUM 9.5 09/21/2018 1300    ALKPHOS 71 09/21/2018 1300   AST 24 09/21/2018 1300   ALT 14 09/21/2018 1300   BILITOT 0.4 09/21/2018 1300       RADIOGRAPHIC STUDIES: No results found.  ASSESSMENT AND PLAN: This is a very pleasant 85 years old white female with stage IIIb non-small cell lung cancer, squamous cell carcinoma.  She is currently undergoing a course of concurrent chemoradiation with weekly carboplatin and paclitaxel status post 6 cycles.  She tolerated this treatment well with partial response. She is currently undergoing consolidation treatment with immunotherapy with Imfinzi status post 15 cycles.   She has been tolerating this treatment well with no concerning adverse effects. The patient will proceed with cycle #16 today as planned. I will see her back for follow-up visit in 2 weeks for evaluation before the next cycle of her treatment. She was advised to call immediately if she has  any concerning symptoms in the interval. The patient voices understanding of current disease status and treatment options and is in agreement with the current care plan. All questions were answered. The patient knows to call the clinic with any problems, questions or concerns. We can certainly see the patient much sooner if necessary.  I spent 10 minutes counseling the patient face to face. The total time spent in the appointment was 15 minutes.  Disclaimer: This note was dictated with voice recognition software. Similar sounding words can inadvertently be transcribed and may not be corrected upon review.

## 2018-10-05 NOTE — Patient Instructions (Signed)
[facility] Discharge Instructions for Patients Receiving Chemotherapy  Today you received the following chemotherapy agents Durvalumab (IMFINZI).  To help prevent nausea and vomiting after your treatment, we encourage you to take your nausea medication as prescribed.   If you develop nausea and vomiting that is not controlled by your nausea medication, call the clinic.   BELOW ARE SYMPTOMS THAT SHOULD BE REPORTED IMMEDIATELY:  *FEVER GREATER THAN 100.5 F  *CHILLS WITH OR WITHOUT FEVER  NAUSEA AND VOMITING THAT IS NOT CONTROLLED WITH YOUR NAUSEA MEDICATION  *UNUSUAL SHORTNESS OF BREATH  *UNUSUAL BRUISING OR BLEEDING  TENDERNESS IN MOUTH AND THROAT WITH OR WITHOUT PRESENCE OF ULCERS  *URINARY PROBLEMS  *BOWEL PROBLEMS  UNUSUAL RASH Items with * indicate a potential emergency and should be followed up as soon as possible.  Feel free to call the clinic should you have any questions or concerns. The clinic phone number is [phone].  Please show the [facility] at check-in to the Emergency Department and triage nurse.  Coronavirus (COVID-19) Are you at risk?  Are you at risk for the Coronavirus (COVID-19)?  To be considered HIGH RISK for Coronavirus (COVID-19), you have to meet the following criteria:  . Traveled to [place], [place], [place], [place] or [place]; or in the [place] to [place], [employer], [place], or [place]; and have fever, cough, and shortness of breath within the last 2 weeks of travel OR . Been in close contact with a person diagnosed with COVID-19 within the last 2 weeks and have fever, cough, and shortness of breath . IF YOU DO NOT MEET THESE CRITERIA, YOU ARE CONSIDERED LOW RISK FOR COVID-19.  What to do if you are HIGH RISK for COVID-19?  Marland Kitchen If you are having a medical emergency, call 911. . Seek medical care right away. Before you go to a doctor's office, urgent care or emergency department, call ahead and tell them  about your recent travel, contact with someone diagnosed with COVID-19, and your symptoms. You should receive instructions from your physician's office regarding next steps of care.  . When you arrive at healthcare provider, tell the healthcare staff immediately you have returned from visiting [place], [place], [place], [place] or [place]; or traveled in the [place] to [place], [place], [place], or [place]; in the last two weeks or you have been in close contact with a person diagnosed with COVID-19 in the last 2 weeks.   . Tell the health care staff about your symptoms: fever, cough and shortness of breath. . After you have been seen by a medical provider, you will be either: o Tested for (COVID-19) and discharged home on quarantine except to seek medical care if symptoms worsen, and asked to  - Stay home and avoid contact with others until you get your results (4-5 days)  - Avoid travel on public transportation if possible (such as bus, train, or airplane) or o Sent to the Emergency Department by EMS for evaluation, COVID-19 testing, and possible admission depending on your condition and test results.  What to do if you are LOW RISK for COVID-19?  Reduce your risk of any infection by using the same precautions used for avoiding the common cold or flu:  Marland Kitchen Wash your hands often with soap and warm water for at least 20 seconds.  If soap and water are not readily available, use an alcohol-based hand sanitizer with at least 60% alcohol.  . If coughing or  sneezing, cover your mouth and nose by coughing or sneezing into the elbow areas of your shirt or coat, into a tissue or into your sleeve (not your hands). . Avoid shaking hands with others and consider head nods or verbal greetings only. . Avoid touching your eyes, nose, or mouth with unwashed hands.  . Avoid close contact with people who are sick. . Avoid places or events with large numbers of people in one location, like concerts or  sporting events. . Carefully consider travel plans you have or are making. . If you are planning any travel outside or inside the [place], visit the CDC's Travelers' Health webpage for the latest health notices. . If you have some symptoms but not all symptoms, continue to monitor at home and seek medical attention if your symptoms worsen. . If you are having a medical emergency, call 911.   [facility] / e-Visit: [email or web address]         [facility]: [facility]: [phone]                   [facility]: [phone]

## 2018-10-19 ENCOUNTER — Inpatient Hospital Stay: Payer: Medicare Other

## 2018-10-19 ENCOUNTER — Inpatient Hospital Stay (HOSPITAL_BASED_OUTPATIENT_CLINIC_OR_DEPARTMENT_OTHER): Payer: Medicare Other | Admitting: Physician Assistant

## 2018-10-19 ENCOUNTER — Other Ambulatory Visit: Payer: Self-pay

## 2018-10-19 VITALS — BP 147/85 | HR 91 | Temp 97.5°F | Resp 18 | Ht 68.0 in | Wt 155.1 lb

## 2018-10-19 DIAGNOSIS — C3491 Malignant neoplasm of unspecified part of right bronchus or lung: Secondary | ICD-10-CM

## 2018-10-19 DIAGNOSIS — C342 Malignant neoplasm of middle lobe, bronchus or lung: Secondary | ICD-10-CM

## 2018-10-19 DIAGNOSIS — Z923 Personal history of irradiation: Secondary | ICD-10-CM

## 2018-10-19 DIAGNOSIS — Z95828 Presence of other vascular implants and grafts: Secondary | ICD-10-CM

## 2018-10-19 DIAGNOSIS — I10 Essential (primary) hypertension: Secondary | ICD-10-CM | POA: Diagnosis not present

## 2018-10-19 DIAGNOSIS — C770 Secondary and unspecified malignant neoplasm of lymph nodes of head, face and neck: Secondary | ICD-10-CM | POA: Diagnosis not present

## 2018-10-19 DIAGNOSIS — Z7901 Long term (current) use of anticoagulants: Secondary | ICD-10-CM

## 2018-10-19 DIAGNOSIS — Z85820 Personal history of malignant melanoma of skin: Secondary | ICD-10-CM

## 2018-10-19 DIAGNOSIS — Z79899 Other long term (current) drug therapy: Secondary | ICD-10-CM

## 2018-10-19 DIAGNOSIS — I825Z3 Chronic embolism and thrombosis of unspecified deep veins of distal lower extremity, bilateral: Secondary | ICD-10-CM

## 2018-10-19 DIAGNOSIS — Z5112 Encounter for antineoplastic immunotherapy: Secondary | ICD-10-CM | POA: Diagnosis not present

## 2018-10-19 DIAGNOSIS — Z9221 Personal history of antineoplastic chemotherapy: Secondary | ICD-10-CM | POA: Diagnosis not present

## 2018-10-19 DIAGNOSIS — E039 Hypothyroidism, unspecified: Secondary | ICD-10-CM | POA: Diagnosis not present

## 2018-10-19 DIAGNOSIS — E785 Hyperlipidemia, unspecified: Secondary | ICD-10-CM

## 2018-10-19 LAB — CBC WITH DIFFERENTIAL (CANCER CENTER ONLY)
Abs Immature Granulocytes: 0.02 10*3/uL (ref 0.00–0.07)
Basophils Absolute: 0 10*3/uL (ref 0.0–0.1)
Basophils Relative: 1 %
Eosinophils Absolute: 0.1 10*3/uL (ref 0.0–0.5)
Eosinophils Relative: 1 %
HCT: 36.7 % (ref 36.0–46.0)
Hemoglobin: 11.6 g/dL — ABNORMAL LOW (ref 12.0–15.0)
Immature Granulocytes: 0 %
Lymphocytes Relative: 19 %
Lymphs Abs: 0.9 10*3/uL (ref 0.7–4.0)
MCH: 29.7 pg (ref 26.0–34.0)
MCHC: 31.6 g/dL (ref 30.0–36.0)
MCV: 93.9 fL (ref 80.0–100.0)
Monocytes Absolute: 0.4 10*3/uL (ref 0.1–1.0)
Monocytes Relative: 8 %
Neutro Abs: 3.5 10*3/uL (ref 1.7–7.7)
Neutrophils Relative %: 71 %
Platelet Count: 132 10*3/uL — ABNORMAL LOW (ref 150–400)
RBC: 3.91 MIL/uL (ref 3.87–5.11)
RDW: 13.6 % (ref 11.5–15.5)
WBC Count: 4.9 10*3/uL (ref 4.0–10.5)
nRBC: 0 % (ref 0.0–0.2)

## 2018-10-19 LAB — CMP (CANCER CENTER ONLY)
ALT: 14 U/L (ref 0–44)
AST: 23 U/L (ref 15–41)
Albumin: 3.3 g/dL — ABNORMAL LOW (ref 3.5–5.0)
Alkaline Phosphatase: 90 U/L (ref 38–126)
Anion gap: 7 (ref 5–15)
BUN: 26 mg/dL — ABNORMAL HIGH (ref 8–23)
CO2: 26 mmol/L (ref 22–32)
Calcium: 9.7 mg/dL (ref 8.9–10.3)
Chloride: 110 mmol/L (ref 98–111)
Creatinine: 1.49 mg/dL — ABNORMAL HIGH (ref 0.44–1.00)
GFR, Est AFR Am: 37 mL/min — ABNORMAL LOW (ref >60)
GFR, Estimated: 32 mL/min — ABNORMAL LOW (ref >60)
Glucose, Bld: 92 mg/dL (ref 70–99)
Potassium: 3.9 mmol/L (ref 3.5–5.1)
Sodium: 143 mmol/L (ref 135–145)
Total Bilirubin: 0.4 mg/dL (ref 0.3–1.2)
Total Protein: 6.7 g/dL (ref 6.5–8.1)

## 2018-10-19 MED ORDER — RIVAROXABAN 20 MG PO TABS: ORAL_TABLET | ORAL | 0 refills | Status: DC

## 2018-10-19 MED ORDER — SODIUM CHLORIDE 0.9 % IV SOLN
10.0000 mg/kg | Freq: Once | INTRAVENOUS | Status: AC
  Administered 2018-10-19: 740 mg via INTRAVENOUS
  Filled 2018-10-19: qty 10

## 2018-10-19 MED ORDER — HEPARIN SOD (PORK) LOCK FLUSH 100 UNIT/ML IV SOLN
500.0000 [IU] | Freq: Once | INTRAVENOUS | Status: AC | PRN
  Administered 2018-10-19: 500 [IU]
  Filled 2018-10-19: qty 5

## 2018-10-19 MED ORDER — SODIUM CHLORIDE 0.9% FLUSH
10.0000 mL | INTRAVENOUS | Status: DC | PRN
  Administered 2018-10-19: 10 mL
  Filled 2018-10-19: qty 10

## 2018-10-19 MED ORDER — SODIUM CHLORIDE 0.9% FLUSH
10.0000 mL | Freq: Once | INTRAVENOUS | Status: AC
  Administered 2018-10-19: 10 mL
  Filled 2018-10-19: qty 10

## 2018-10-19 MED ORDER — SODIUM CHLORIDE 0.9 % IV SOLN
Freq: Once | INTRAVENOUS | Status: AC
  Administered 2018-10-19: 15:00:00 via INTRAVENOUS
  Filled 2018-10-19: qty 250

## 2018-10-19 NOTE — Progress Notes (Signed)
[facility] OFFICE PROGRESS NOTE  [name], MD [place] [address]  DIAGNOSIS: Stage IIIB (T3, N3, M0) non-small cell lung cancer, squamous cell carcinoma presented with large right middle lobe lung mass in addition to right hilar, subcarinal and right supraclavicular lymphadenopathy diagnosed in August 2019  PRIOR THERAPY: Concurrent chemoradiation with weekly carboplatin for AUC of 2 and paclitaxel 45 NG/M2. Status post 6 cycles with partial response.  CURRENT THERAPY: Consolidation immunotherapy with Imfinzi 10 mg/KG every 2 weeks started February 28, 2018. Status post 16cycles.  INTERVAL HISTORY: Lisa [name] 85 y.o. female returns to the clinic for a follow-up visit.  The patient is feeling well today without any concerning complaints.  She continues to tolerate her treatment well without any adverse effects.  She denies any fever, chills, night sweats, or weight loss.  She denies any chest pain, shortness of breath, cough, or hemoptysis.  She denies any nausea, vomiting, diarrhea, or constipation.  She denies any headache or visual changes.  She denies any rashes or skin changes.  She is here today for evaluation for starting cycle #17.  MEDICAL HISTORY: Past Medical History:  Diagnosis Date  . Diverticulosis   . Headache   . Hemorrhoids    internal and external  . Hyperlipidemia 01/29/1991  . Hypertension 10/29/1995  . Hypothyroidism 10/29/1995  . Lung mass    right middle lobe  . Melanoma ([facility])    h/o, local excision. no chemo or rady.   . Skin cancer (melanoma) ([facility])   . Tubular adenoma of colon   . Wears dentures     ALLERGIES:  is allergic to atorvastatin; celebrex [celecoxib]; cholecalciferol; and simvastatin.  MEDICATIONS:  Current Outpatient Medications  Medication Sig Dispense Refill  . amLODipine (NORVASC) 10 MG tablet Take 1 tablet by mouth once daily 90 tablet 0  . Cholecalciferol (VITAMIN D) 2000 units  tablet Take 2,000 Units by mouth 2 (two) times daily.    . Coenzyme Q10 100 MG capsule Take 100 mg by mouth daily.     Marland Kitchen docusate sodium (COLACE) 100 MG capsule Take 100 mg by mouth daily as needed for mild constipation.    . fluticasone (FLONASE) 50 MCG/ACT nasal spray USE 2 SPRAYS IN EACH NOSTRIL DAILY AS NEEDED FOR ALLERGIES 48 g 3  . levothyroxine (SYNTHROID, LEVOTHROID) 75 MCG tablet Take 1 tablet by mouth once daily 90 tablet 3  . lidocaine-prilocaine (EMLA) cream Apply 1 application topically as needed. 30 g 0  . mirtazapine (REMERON) 7.5 MG tablet TAKE 1 TABLET BY MOUTH EVERY DAY AT BEDTIME 30 tablet 0  . potassium chloride SA (K-DUR,KLOR-CON) 20 MEQ tablet Take 1 tablet (20 mEq total) by mouth 2 (two) times daily. 30 tablet 0  . rivaroxaban (XARELTO) 20 MG TABS tablet TAKE 1 TABLET BY MOUTH ONCE DAILY WITH  SUPPER 30 tablet 0  . sennosides-docusate sodium (SENOKOT-S) 8.6-50 MG tablet Take 1 tablet by mouth daily as needed for constipation.    Marland Kitchen loratadine (CLARITIN) 10 MG tablet Take 1 tablet (10 mg total) by mouth daily as needed for allergies or rhinitis. (Patient not taking: Reported on 08/11/2018)    . prochlorperazine (COMPAZINE) 10 MG tablet Take 1 tablet (10 mg total) by mouth every 6 (six) hours as needed for nausea or vomiting. (Patient not taking: Reported on 08/25/2018) 30 tablet 0   No current facility-administered medications for this visit.    Facility-Administered Medications Ordered in Other Visits  Medication  Dose Route Frequency Provider Last Rate Last Dose  . acetaminophen (TYLENOL) tablet 650 mg  650 mg Oral Once Nicholas Lose, MD        SURGICAL HISTORY:  Past Surgical History:  Procedure Laterality Date  . BRONCHIAL BIOPSY  11/23/2017   Procedure: BRONCHIAL BIOPSIES;  Surgeon: Grace Isaac, MD;  Location: [facility] OR;  Service: Thoracic;;  . cataract surgery  2007, 2008   repair bilat  . DG BARIUM ENEMA ([facility]X) N/A 2016   [name]  . HERNIA REPAIR   04/25/2001  . IR IMAGING GUIDED PORT INSERTION  07/05/2018  . IR US GUIDE BX ASP/DRAIN  11/17/2017  . KNEE SURGERY     meniscus tear per Dr. Ronnie [place], R knee  . MULTIPLE TOOTH EXTRACTIONS    . SEPTOPLASTY  2006   Dr. Ernesto Rutherford  . SKIN CANCER EXCISION    . TONSILLECTOMY  1954  . VAGINAL HYSTERECTOMY    . VIDEO BRONCHOSCOPY WITH ENDOBRONCHIAL ULTRASOUND N/A 11/23/2017   Procedure: VIDEO BRONCHOSCOPY WITH ENDOBRONCHIAL ULTRASOUND;  Surgeon: Grace Isaac, MD;  Location: [facility];  Service: Thoracic;  Laterality: N/A;    REVIEW OF SYSTEMS:   Review of Systems  Constitutional: Negative for appetite change, chills, fatigue, fever and unexpected weight change.  HENT:   Negative for mouth sores, nosebleeds, sore throat and trouble swallowing.   Eyes: Negative for eye problems and icterus.  Respiratory: Negative for cough, hemoptysis, shortness of breath and wheezing.   Cardiovascular: Negative for chest pain and leg swelling.  Gastrointestinal: Negative for abdominal pain, constipation, diarrhea, nausea and vomiting.  Genitourinary: Negative for bladder incontinence, difficulty urinating, dysuria, frequency and hematuria.   Musculoskeletal: Negative for back pain, gait problem, neck pain and neck stiffness.  Skin: Negative for itching and rash.  Neurological: Negative for dizziness, extremity weakness, gait problem, headaches, light-headedness and seizures.  Hematological: Negative for adenopathy. Does not bruise/bleed easily.  Psychiatric/Behavioral: Negative for confusion, depression and sleep disturbance. The patient is not nervous/anxious.     PHYSICAL EXAMINATION:  Blood pressure (!) 147/85, pulse 91, temperature (!) 97.5 F (36.4 C), temperature source Temporal, resp. rate 18, height 5\' 8"  (1.727 m), weight 155 lb 1.6 oz (70.4 kg), SpO2 99 %.  ECOG PERFORMANCE STATUS: 1 - Symptomatic but completely ambulatory  Physical Exam  Constitutional: Oriented to person, place, and time and  well-developed, well-nourished, and in no distress.  HENT:  Head: Normocephalic and atraumatic.  Mouth/Throat: Oropharynx is clear and moist. No oropharyngeal exudate.  Eyes: Conjunctivae are normal. Right eye exhibits no discharge. Left eye exhibits no discharge. No scleral icterus.  Neck: Normal range of motion. Neck supple.  Cardiovascular: Normal rate, regular rhythm, normal heart sounds and intact distal pulses.   Pulmonary/Chest: Effort normal and breath sounds normal. No respiratory distress. No wheezes. No rales.  Abdominal: Soft. Bowel sounds are normal. Exhibits no distension and no mass. There is no tenderness.  Musculoskeletal: Bilateral lower extremity edema. Normal range of motion.   Lymphadenopathy:    No cervical adenopathy.  Neurological: Alert and oriented to person, place, and time. Exhibits normal muscle tone. Gait normal. Coordination normal.  Skin: Skin is warm and dry. No rash noted. Not diaphoretic. No erythema. No pallor.  Psychiatric: Mood, memory and judgment normal.  Vitals reviewed.  LABORATORY DATA: Lab Results  Component Value Date   WBC 4.9 10/19/2018   HGB 11.6 (L) 10/19/2018   HCT 36.7 10/19/2018   MCV 93.9 10/19/2018   PLT 132 (L) 10/19/2018  Chemistry      Component Value Date/Time   NA 143 10/19/2018 1245   K 3.9 10/19/2018 1245   CL 110 10/19/2018 1245   CO2 26 10/19/2018 1245   BUN 26 (H) 10/19/2018 1245   CREATININE 1.49 (H) 10/19/2018 1245      Component Value Date/Time   CALCIUM 9.7 10/19/2018 1245   ALKPHOS 90 10/19/2018 1245   AST 23 10/19/2018 1245   ALT 14 10/19/2018 1245   BILITOT 0.4 10/19/2018 1245       RADIOGRAPHIC STUDIES:  No results found.   ASSESSMENT/PLAN:  This is a very pleasant 85 year old Caucasian female with stage IIIb non-small cell lung cancer, squamous cell carcinoma.  She presented with a large right lower lobe lung mass in addition to right hilar, subcarinal, and left supraclavicular  lymphadenopathy.  She was diagnosed in August 2019.  She completed a course of concurrent chemoradiation with carboplatin and paclitaxel.  She is status post 6 cycles with partial response.  She is currently undergoing consolidation immunotherapy with Imfinzi 10 mg/kg IV every 2 weeks.  She is status post 16 cycles.  She is tolerating treatment well without any adverse side effects.  Labs were reviewed with the patient.  I recommend that she proceed with cycle #17 today as scheduled.  I will see her back for follow-up visit in 2 weeks for evaluation before starting cycle #18.  I have sent a refill for Xarelto to her pharmacy.   The patient was advised to call immediately if she has any concerning symptoms in the interval. The patient voices understanding of current disease status and treatment options and is in agreement with the current care plan. All questions were answered. The patient knows to call the clinic with any problems, questions or concerns. We can certainly see the patient much sooner if necessary No orders of the defined types were placed in this encounter.    Loranzo Desha L Lera Gaines, PA-C 10/19/18

## 2018-10-19 NOTE — Patient Instructions (Signed)
[facility] Discharge Instructions for Patients Receiving Chemotherapy  Today you received the following chemotherapy agents: Imfinzi.  To help prevent nausea and vomiting after your treatment, we encourage you to take your nausea medication as directed.   If you develop nausea and vomiting that is not controlled by your nausea medication, call the clinic.   BELOW ARE SYMPTOMS THAT SHOULD BE REPORTED IMMEDIATELY:  *FEVER GREATER THAN 100.5 F  *CHILLS WITH OR WITHOUT FEVER  NAUSEA AND VOMITING THAT IS NOT CONTROLLED WITH YOUR NAUSEA MEDICATION  *UNUSUAL SHORTNESS OF BREATH  *UNUSUAL BRUISING OR BLEEDING  TENDERNESS IN MOUTH AND THROAT WITH OR WITHOUT PRESENCE OF ULCERS  *URINARY PROBLEMS  *BOWEL PROBLEMS  UNUSUAL RASH Items with * indicate a potential emergency and should be followed up as soon as possible.  Feel free to call the clinic should you have any questions or concerns. The clinic phone number is [phone].  Please show the CHEMO ALERT CARD at check-in to the Emergency Department and triage nurse.   

## 2018-11-02 ENCOUNTER — Inpatient Hospital Stay: Payer: Medicare Other

## 2018-11-02 ENCOUNTER — Inpatient Hospital Stay (HOSPITAL_BASED_OUTPATIENT_CLINIC_OR_DEPARTMENT_OTHER): Payer: Medicare Other | Admitting: Physician Assistant

## 2018-11-02 ENCOUNTER — Inpatient Hospital Stay: Payer: Medicare Other | Attending: Internal Medicine

## 2018-11-02 ENCOUNTER — Other Ambulatory Visit: Payer: Self-pay

## 2018-11-02 VITALS — BP 140/90 | HR 86 | Temp 98.1°F | Resp 20 | Ht 68.0 in | Wt 153.6 lb

## 2018-11-02 DIAGNOSIS — C3491 Malignant neoplasm of unspecified part of right bronchus or lung: Secondary | ICD-10-CM

## 2018-11-02 DIAGNOSIS — R5383 Other fatigue: Secondary | ICD-10-CM | POA: Insufficient documentation

## 2018-11-02 DIAGNOSIS — Z923 Personal history of irradiation: Secondary | ICD-10-CM | POA: Insufficient documentation

## 2018-11-02 DIAGNOSIS — I10 Essential (primary) hypertension: Secondary | ICD-10-CM | POA: Insufficient documentation

## 2018-11-02 DIAGNOSIS — E039 Hypothyroidism, unspecified: Secondary | ICD-10-CM | POA: Diagnosis not present

## 2018-11-02 DIAGNOSIS — Z95828 Presence of other vascular implants and grafts: Secondary | ICD-10-CM

## 2018-11-02 DIAGNOSIS — Z5112 Encounter for antineoplastic immunotherapy: Secondary | ICD-10-CM | POA: Insufficient documentation

## 2018-11-02 DIAGNOSIS — E785 Hyperlipidemia, unspecified: Secondary | ICD-10-CM

## 2018-11-02 DIAGNOSIS — Z85820 Personal history of malignant melanoma of skin: Secondary | ICD-10-CM | POA: Insufficient documentation

## 2018-11-02 DIAGNOSIS — C342 Malignant neoplasm of middle lobe, bronchus or lung: Secondary | ICD-10-CM | POA: Insufficient documentation

## 2018-11-02 DIAGNOSIS — L989 Disorder of the skin and subcutaneous tissue, unspecified: Secondary | ICD-10-CM | POA: Insufficient documentation

## 2018-11-02 DIAGNOSIS — Z9221 Personal history of antineoplastic chemotherapy: Secondary | ICD-10-CM | POA: Diagnosis not present

## 2018-11-02 DIAGNOSIS — Z7901 Long term (current) use of anticoagulants: Secondary | ICD-10-CM | POA: Insufficient documentation

## 2018-11-02 DIAGNOSIS — Z86718 Personal history of other venous thrombosis and embolism: Secondary | ICD-10-CM | POA: Diagnosis not present

## 2018-11-02 DIAGNOSIS — Z79899 Other long term (current) drug therapy: Secondary | ICD-10-CM | POA: Diagnosis not present

## 2018-11-02 DIAGNOSIS — R0609 Other forms of dyspnea: Secondary | ICD-10-CM | POA: Diagnosis not present

## 2018-11-02 LAB — CBC WITH DIFFERENTIAL (CANCER CENTER ONLY)
Abs Immature Granulocytes: 0.01 10*3/uL (ref 0.00–0.07)
Basophils Absolute: 0 10*3/uL (ref 0.0–0.1)
Basophils Relative: 1 %
Eosinophils Absolute: 0.1 10*3/uL (ref 0.0–0.5)
Eosinophils Relative: 1 %
HCT: 37 % (ref 36.0–46.0)
Hemoglobin: 11.5 g/dL — ABNORMAL LOW (ref 12.0–15.0)
Immature Granulocytes: 0 %
Lymphocytes Relative: 17 %
Lymphs Abs: 0.8 10*3/uL (ref 0.7–4.0)
MCH: 28.8 pg (ref 26.0–34.0)
MCHC: 31.1 g/dL (ref 30.0–36.0)
MCV: 92.5 fL (ref 80.0–100.0)
Monocytes Absolute: 0.4 10*3/uL (ref 0.1–1.0)
Monocytes Relative: 8 %
Neutro Abs: 3.6 10*3/uL (ref 1.7–7.7)
Neutrophils Relative %: 73 %
Platelet Count: 139 10*3/uL — ABNORMAL LOW (ref 150–400)
RBC: 4 MIL/uL (ref 3.87–5.11)
RDW: 13.5 % (ref 11.5–15.5)
WBC Count: 4.9 10*3/uL (ref 4.0–10.5)
nRBC: 0 % (ref 0.0–0.2)

## 2018-11-02 LAB — CMP (CANCER CENTER ONLY)
ALT: 15 U/L (ref 0–44)
AST: 23 U/L (ref 15–41)
Albumin: 3.6 g/dL (ref 3.5–5.0)
Alkaline Phosphatase: 87 U/L (ref 38–126)
Anion gap: 10 (ref 5–15)
BUN: 27 mg/dL — ABNORMAL HIGH (ref 8–23)
CO2: 23 mmol/L (ref 22–32)
Calcium: 9.5 mg/dL (ref 8.9–10.3)
Chloride: 109 mmol/L (ref 98–111)
Creatinine: 1.55 mg/dL — ABNORMAL HIGH (ref 0.44–1.00)
GFR, Est AFR Am: 35 mL/min — ABNORMAL LOW (ref >60)
GFR, Estimated: 30 mL/min — ABNORMAL LOW (ref >60)
Glucose, Bld: 102 mg/dL — ABNORMAL HIGH (ref 70–99)
Potassium: 4.1 mmol/L (ref 3.5–5.1)
Sodium: 142 mmol/L (ref 135–145)
Total Bilirubin: 0.4 mg/dL (ref 0.3–1.2)
Total Protein: 6.8 g/dL (ref 6.5–8.1)

## 2018-11-02 LAB — TSH: TSH: 1.577 u[IU]/mL (ref 0.308–3.960)

## 2018-11-02 MED ORDER — HEPARIN SOD (PORK) LOCK FLUSH 100 UNIT/ML IV SOLN
500.0000 [IU] | Freq: Once | INTRAVENOUS | Status: AC | PRN
  Administered 2018-11-02: 500 [IU]
  Filled 2018-11-02: qty 5

## 2018-11-02 MED ORDER — SODIUM CHLORIDE 0.9 % IV SOLN
10.0000 mg/kg | Freq: Once | INTRAVENOUS | Status: AC
  Administered 2018-11-02: 740 mg via INTRAVENOUS
  Filled 2018-11-02: qty 10

## 2018-11-02 MED ORDER — SODIUM CHLORIDE 0.9% FLUSH
10.0000 mL | Freq: Once | INTRAVENOUS | Status: AC
  Administered 2018-11-02: 13:00:00 10 mL
  Filled 2018-11-02: qty 10

## 2018-11-02 MED ORDER — SODIUM CHLORIDE 0.9 % IV SOLN
Freq: Once | INTRAVENOUS | Status: AC
  Administered 2018-11-02: 14:00:00 via INTRAVENOUS
  Filled 2018-11-02: qty 250

## 2018-11-02 MED ORDER — SODIUM CHLORIDE 0.9% FLUSH
10.0000 mL | INTRAVENOUS | Status: DC | PRN
  Administered 2018-11-02: 10 mL
  Filled 2018-11-02: qty 10

## 2018-11-02 NOTE — Progress Notes (Signed)
Per Dr Mohamed, ok to treat with creatinine 1.55. 

## 2018-11-02 NOTE — Progress Notes (Signed)
[facility] OFFICE PROGRESS NOTE  [name], MD [place] [address]  DIAGNOSIS: Stage IIIB (T3, N3, M0) non-small cell lung cancer, squamous cell carcinoma presented with large right middle lobe lung mass in addition to right hilar, subcarinal and right supraclavicular lymphadenopathy diagnosed in August 2019  PRIOR THERAPY: Concurrent chemoradiation with weekly carboplatin for AUC of 2 and paclitaxel 45 NG/M2. Status post 6 cycles with partial response   CURRENT THERAPY: Consolidation immunotherapy with Imfinzi 10 mg/KG every 2 weeks started February 28, 2018. Status post 17cycles.  INTERVAL HISTORY: Lisa [name] 85 y.o. female returns to the clinic for a follow up visit. The patient is feeling well today with out any concerning complaints. She is going to see the dermatologist for removal of a skin lesion on her face. She also notes a healing scab on her right shin. She denies any purulent drainage, pain, swelling, heat, or significant erythema. She continues to tolerate her treatment with immunotherapy well without any adverse side effects. She denies any fever, chills, night sweats, or weight loss. She denies any chest pain, shortness of breath, cough, or hemoptysis. She denies any nausea, vomiting, diarrhea, or constipation. She denies any headaches or visual changes. She denies any other rashes or skin changes. She is here today for evaluation before starting cycle #18.   MEDICAL HISTORY: Past Medical History:  Diagnosis Date  . Diverticulosis   . Headache   . Hemorrhoids    internal and external  . Hyperlipidemia 01/29/1991  . Hypertension 10/29/1995  . Hypothyroidism 10/29/1995  . Lung mass    right middle lobe  . Melanoma ([place])    h/o, local excision. no chemo or rady.   . Skin cancer (melanoma) ([place])   . Tubular adenoma of colon   . Wears dentures     ALLERGIES:  is allergic to atorvastatin; celebrex [celecoxib];  cholecalciferol; and simvastatin.  MEDICATIONS:  Current Outpatient Medications  Medication Sig Dispense Refill  . amLODipine (NORVASC) 10 MG tablet Take 1 tablet by mouth once daily 90 tablet 0  . Cholecalciferol (VITAMIN D) 2000 units tablet Take 2,000 Units by mouth 2 (two) times daily.    . Coenzyme Q10 100 MG capsule Take 100 mg by mouth daily.     Marland Kitchen docusate sodium (COLACE) 100 MG capsule Take 100 mg by mouth daily as needed for mild constipation.    . fluticasone (FLONASE) 50 MCG/ACT nasal spray USE 2 SPRAYS IN EACH NOSTRIL DAILY AS NEEDED FOR ALLERGIES 48 g 3  . levothyroxine (SYNTHROID, LEVOTHROID) 75 MCG tablet Take 1 tablet by mouth once daily 90 tablet 3  . lidocaine-prilocaine (EMLA) cream Apply 1 application topically as needed. 30 g 0  . loratadine (CLARITIN) 10 MG tablet Take 1 tablet (10 mg total) by mouth daily as needed for allergies or rhinitis. (Patient not taking: Reported on 08/11/2018)    . mirtazapine (REMERON) 7.5 MG tablet TAKE 1 TABLET BY MOUTH EVERY DAY AT BEDTIME 30 tablet 0  . potassium chloride SA (K-DUR,KLOR-CON) 20 MEQ tablet Take 1 tablet (20 mEq total) by mouth 2 (two) times daily. 30 tablet 0  . prochlorperazine (COMPAZINE) 10 MG tablet Take 1 tablet (10 mg total) by mouth every 6 (six) hours as needed for nausea or vomiting. (Patient not taking: Reported on 08/25/2018) 30 tablet 0  . rivaroxaban (XARELTO) 20 MG TABS tablet TAKE 1 TABLET BY MOUTH ONCE DAILY WITH  SUPPER 30 tablet 0  . sennosides-docusate  sodium (SENOKOT-S) 8.6-50 MG tablet Take 1 tablet by mouth daily as needed for constipation.     No current facility-administered medications for this visit.    Facility-Administered Medications Ordered in Other Visits  Medication Dose Route Frequency Provider Last Rate Last Dose  . 0.9 %  sodium chloride infusion   Intravenous Once Curt Bears, MD      . acetaminophen (TYLENOL) tablet 650 mg  650 mg Oral Once Nicholas Lose, MD      . durvalumab  (IMFINZI) 740 mg in sodium chloride 0.9 % 100 mL chemo infusion  10 mg/kg (Treatment Plan Recorded) Intravenous Once Curt Bears, MD      . heparin lock flush 100 unit/mL  500 Units Intracatheter Once PRN Curt Bears, MD      . sodium chloride flush (NS) 0.9 % injection 10 mL  10 mL Intracatheter PRN Curt Bears, MD        SURGICAL HISTORY:  Past Surgical History:  Procedure Laterality Date  . BRONCHIAL BIOPSY  11/23/2017   Procedure: BRONCHIAL BIOPSIES;  Surgeon: Grace Isaac, MD;  Location: [facility] OR;  Service: Thoracic;;  . cataract surgery  2007, 2008   repair bilat  . DG BARIUM ENEMA ([place] HX) N/A 2016   [name]  . HERNIA REPAIR  04/25/2001  . IR IMAGING GUIDED PORT INSERTION  07/05/2018  . IR US GUIDE BX ASP/DRAIN  11/17/2017  . KNEE SURGERY     meniscus tear per Dr. Ronnie Derby, R knee  . MULTIPLE TOOTH EXTRACTIONS    . SEPTOPLASTY  2006   Dr. Ernesto Rutherford  . SKIN CANCER EXCISION    . TONSILLECTOMY  1954  . VAGINAL HYSTERECTOMY    . VIDEO BRONCHOSCOPY WITH ENDOBRONCHIAL ULTRASOUND N/A 11/23/2017   Procedure: VIDEO BRONCHOSCOPY WITH ENDOBRONCHIAL ULTRASOUND;  Surgeon: Grace Isaac, MD;  Location: [facility];  Service: Thoracic;  Laterality: N/A;    REVIEW OF SYSTEMS:   Review of Systems  Constitutional: Negative for appetite change, chills, fatigue, fever and unexpected weight change.  HENT:   Negative for mouth sores, nosebleeds, sore throat and trouble swallowing.   Eyes: Negative for eye problems and icterus.  Respiratory: Negative for cough, hemoptysis, shortness of breath and wheezing.   Cardiovascular: Negative for chest pain and leg swelling.  Gastrointestinal: Negative for abdominal pain, constipation, diarrhea, nausea and vomiting.  Genitourinary: Negative for bladder incontinence, difficulty urinating, dysuria, frequency and hematuria.   Musculoskeletal: Negative for back pain, gait problem, neck pain and neck stiffness.  Skin: Positive for small skin  lesion on her right cheek and scab on her right leg. Negative for itching and rash.  Neurological: Negative for dizziness, extremity weakness, gait problem, headaches, light-headedness and seizures.  Hematological: Negative for adenopathy. Does not bruise/bleed easily.  Psychiatric/Behavioral: Negative for confusion, depression and sleep disturbance. The patient is not nervous/anxious.     PHYSICAL EXAMINATION:  Blood pressure 140/90, pulse 86, temperature 98.1 F (36.7 C), temperature source Oral, resp. rate 20, height 5\' 8"  (1.727 m), weight 153 lb 9.6 oz (69.7 kg), SpO2 98 %.  ECOG PERFORMANCE STATUS: 1 - Symptomatic but completely ambulatory  Physical Exam  Constitutional: Oriented to person, place, and time and well-developed, well-nourished, and in no distress.   HENT:  Head: Normocephalic and atraumatic.  Mouth/Throat: Oropharynx is clear and moist. No oropharyngeal exudate.  Eyes: Conjunctivae are normal. Right eye exhibits no discharge. Left eye exhibits no discharge. No scleral icterus.  Neck: Normal range of motion. Neck supple.  Cardiovascular:  Normal rate, regular rhythm, normal heart sounds and intact distal pulses.   Pulmonary/Chest: Effort normal and breath sounds normal, mildly decreased breath sounds in right lower/middle lobe. No respiratory distress. No wheezes. No rales.  Abdominal: Soft. Bowel sounds are normal. Exhibits no distension and no mass. There is no tenderness.  Musculoskeletal: Bilateral lower extremity edema. Normal range of motion.  Lymphadenopathy:    No cervical adenopathy.  Neurological: Alert and oriented to person, place, and time. Exhibits normal muscle tone. Gait normal. Coordination normal.  Skin: Positive for small skin lesion on her right cheek and scab on her right leg. Skin is warm and dry. No rash noted. Not diaphoretic. No erythema. No pallor. No purulent drainage.  Psychiatric: Mood, memory and judgment normal.  Vitals  reviewed.  LABORATORY DATA: Lab Results  Component Value Date   WBC 4.9 11/02/2018   HGB 11.5 (L) 11/02/2018   HCT 37.0 11/02/2018   MCV 92.5 11/02/2018   PLT 139 (L) 11/02/2018      Chemistry      Component Value Date/Time   NA 142 11/02/2018 1231   K 4.1 11/02/2018 1231   CL 109 11/02/2018 1231   CO2 23 11/02/2018 1231   BUN 27 (H) 11/02/2018 1231   CREATININE 1.55 (H) 11/02/2018 1231      Component Value Date/Time   CALCIUM 9.5 11/02/2018 1231   ALKPHOS 87 11/02/2018 1231   AST 23 11/02/2018 1231   ALT 15 11/02/2018 1231   BILITOT 0.4 11/02/2018 1231       RADIOGRAPHIC STUDIES:  No results found.   ASSESSMENT/PLAN:  This is a very pleasant 85 year old Caucasian female with stage IIIb non-small cell lung cancer, squamous cell carcinoma. She presented with a large right lower lobe lung mass in addition to right hilar, subcarinal, and left supraclavicular lymphadenopathy. She was diagnosed in August 2019. She completed a course of concurrent chemoradiation with carboplatin and paclitaxel. She is status post 6 cycles with partial response.  She is currently undergoing consolidation immunotherapy with Imfinzi 10 mg/kg IV every 2 weeks. She is status post 17 cycles. She is tolerating treatment well without any adverse side effects.  Labs were reviewed with the patient.I recommend that she proceed with cycle #18 today as scheduled.  I will arrange for a restaging CT scan prior to her next visit.   I will see her back for a follow up visit in 2 weeks for evaluation and to review her scan results before starting cycle #19.   She will continue on Xarelto for her history of a DVT  The patient's scab on her right shin appears to be healing. I discussed concerning symptoms such as fevers, chills, pain, swelling, purulent drainage, or erythema. She voiced understanding.   The patient was advised to call immediately if she has any concerning symptoms in the  interval. The patient voices understanding of current disease status and treatment options and is in agreement with the current care plan. All questions were answered. The patient knows to call the clinic with any problems, questions or concerns. We can certainly see the patient much sooner if necessary  Orders Placed This Encounter  Procedures  . CT Chest Wo Contrast    Standing Status:   Future    Standing Expiration Date:   11/02/2019    Order Specific Question:   ** REASON FOR EXAM (FREE TEXT)    Answer:   Restaging Lung Cancer    Order Specific Question:   Preferred imaging location?  Answer:   [facility]    Order Specific Question:   Radiology Contrast Protocol - do NOT remove file path    Answer:   [email or web address]     [place], PA-C 11/02/18

## 2018-11-07 ENCOUNTER — Other Ambulatory Visit: Payer: Self-pay | Admitting: Internal Medicine

## 2018-11-07 DIAGNOSIS — R630 Anorexia: Secondary | ICD-10-CM

## 2018-11-11 ENCOUNTER — Other Ambulatory Visit: Payer: Self-pay

## 2018-11-11 ENCOUNTER — Ambulatory Visit (HOSPITAL_COMMUNITY)
Admission: RE | Admit: 2018-11-11 | Discharge: 2018-11-11 | Disposition: A | Discharge status: Home / Self Care | Payer: Medicare Other | Source: Ambulatory Visit | Attending: Physician Assistant | Admitting: Physician Assistant

## 2018-11-11 DIAGNOSIS — C3491 Malignant neoplasm of unspecified part of right bronchus or lung: Secondary | ICD-10-CM | POA: Insufficient documentation

## 2018-11-16 ENCOUNTER — Other Ambulatory Visit: Payer: Self-pay

## 2018-11-16 ENCOUNTER — Encounter: Payer: Self-pay | Admitting: Internal Medicine

## 2018-11-16 ENCOUNTER — Inpatient Hospital Stay: Payer: Medicare Other

## 2018-11-16 ENCOUNTER — Inpatient Hospital Stay (HOSPITAL_BASED_OUTPATIENT_CLINIC_OR_DEPARTMENT_OTHER): Payer: Medicare Other | Admitting: Internal Medicine

## 2018-11-16 ENCOUNTER — Telehealth: Payer: Self-pay | Admitting: Internal Medicine

## 2018-11-16 VITALS — BP 133/93 | HR 78 | Temp 99.1°F | Resp 20 | Ht 68.0 in | Wt 154.1 lb

## 2018-11-16 DIAGNOSIS — Z79899 Other long term (current) drug therapy: Secondary | ICD-10-CM | POA: Diagnosis not present

## 2018-11-16 DIAGNOSIS — Z85820 Personal history of malignant melanoma of skin: Secondary | ICD-10-CM | POA: Diagnosis not present

## 2018-11-16 DIAGNOSIS — C3491 Malignant neoplasm of unspecified part of right bronchus or lung: Secondary | ICD-10-CM

## 2018-11-16 DIAGNOSIS — E785 Hyperlipidemia, unspecified: Secondary | ICD-10-CM | POA: Diagnosis not present

## 2018-11-16 DIAGNOSIS — Z95828 Presence of other vascular implants and grafts: Secondary | ICD-10-CM

## 2018-11-16 DIAGNOSIS — Z86718 Personal history of other venous thrombosis and embolism: Secondary | ICD-10-CM | POA: Diagnosis not present

## 2018-11-16 DIAGNOSIS — Z9221 Personal history of antineoplastic chemotherapy: Secondary | ICD-10-CM | POA: Diagnosis not present

## 2018-11-16 DIAGNOSIS — Z5112 Encounter for antineoplastic immunotherapy: Secondary | ICD-10-CM | POA: Diagnosis not present

## 2018-11-16 DIAGNOSIS — L989 Disorder of the skin and subcutaneous tissue, unspecified: Secondary | ICD-10-CM | POA: Diagnosis not present

## 2018-11-16 DIAGNOSIS — R0609 Other forms of dyspnea: Secondary | ICD-10-CM | POA: Diagnosis not present

## 2018-11-16 DIAGNOSIS — E039 Hypothyroidism, unspecified: Secondary | ICD-10-CM

## 2018-11-16 DIAGNOSIS — Z7901 Long term (current) use of anticoagulants: Secondary | ICD-10-CM | POA: Diagnosis not present

## 2018-11-16 DIAGNOSIS — I10 Essential (primary) hypertension: Secondary | ICD-10-CM

## 2018-11-16 DIAGNOSIS — C342 Malignant neoplasm of middle lobe, bronchus or lung: Secondary | ICD-10-CM | POA: Diagnosis not present

## 2018-11-16 DIAGNOSIS — Z923 Personal history of irradiation: Secondary | ICD-10-CM | POA: Diagnosis not present

## 2018-11-16 DIAGNOSIS — R5383 Other fatigue: Secondary | ICD-10-CM | POA: Diagnosis not present

## 2018-11-16 LAB — CBC WITH DIFFERENTIAL (CANCER CENTER ONLY)
Abs Immature Granulocytes: 0.03 10*3/uL (ref 0.00–0.07)
Basophils Absolute: 0 10*3/uL (ref 0.0–0.1)
Basophils Relative: 1 %
Eosinophils Absolute: 0.1 10*3/uL (ref 0.0–0.5)
Eosinophils Relative: 1 %
HCT: 36.7 % (ref 36.0–46.0)
Hemoglobin: 11.7 g/dL — ABNORMAL LOW (ref 12.0–15.0)
Immature Granulocytes: 1 %
Lymphocytes Relative: 16 %
Lymphs Abs: 0.9 10*3/uL (ref 0.7–4.0)
MCH: 29.9 pg (ref 26.0–34.0)
MCHC: 31.9 g/dL (ref 30.0–36.0)
MCV: 93.9 fL (ref 80.0–100.0)
Monocytes Absolute: 0.4 10*3/uL (ref 0.1–1.0)
Monocytes Relative: 7 %
Neutro Abs: 4 10*3/uL (ref 1.7–7.7)
Neutrophils Relative %: 74 %
Platelet Count: 151 10*3/uL (ref 150–400)
RBC: 3.91 MIL/uL (ref 3.87–5.11)
RDW: 13.6 % (ref 11.5–15.5)
WBC Count: 5.4 10*3/uL (ref 4.0–10.5)
nRBC: 0 % (ref 0.0–0.2)

## 2018-11-16 LAB — CMP (CANCER CENTER ONLY)
ALT: 11 U/L (ref 0–44)
AST: 19 U/L (ref 15–41)
Albumin: 3.5 g/dL (ref 3.5–5.0)
Alkaline Phosphatase: 80 U/L (ref 38–126)
Anion gap: 8 (ref 5–15)
BUN: 23 mg/dL (ref 8–23)
CO2: 25 mmol/L (ref 22–32)
Calcium: 9.4 mg/dL (ref 8.9–10.3)
Chloride: 109 mmol/L (ref 98–111)
Creatinine: 1.54 mg/dL — ABNORMAL HIGH (ref 0.44–1.00)
GFR, Est AFR Am: 36 mL/min — ABNORMAL LOW (ref >60)
GFR, Estimated: 31 mL/min — ABNORMAL LOW (ref >60)
Glucose, Bld: 78 mg/dL (ref 70–99)
Potassium: 4 mmol/L (ref 3.5–5.1)
Sodium: 142 mmol/L (ref 135–145)
Total Bilirubin: 0.4 mg/dL (ref 0.3–1.2)
Total Protein: 6.8 g/dL (ref 6.5–8.1)

## 2018-11-16 MED ORDER — SODIUM CHLORIDE 0.9% FLUSH
10.0000 mL | INTRAVENOUS | Status: DC | PRN
  Administered 2018-11-16: 10 mL
  Filled 2018-11-16: qty 10

## 2018-11-16 MED ORDER — SODIUM CHLORIDE 0.9% FLUSH
10.0000 mL | Freq: Once | INTRAVENOUS | Status: AC
  Administered 2018-11-16: 10 mL
  Filled 2018-11-16: qty 10

## 2018-11-16 MED ORDER — SODIUM CHLORIDE 0.9 % IV SOLN
10.0000 mg/kg | Freq: Once | INTRAVENOUS | Status: AC
  Administered 2018-11-16: 740 mg via INTRAVENOUS
  Filled 2018-11-16: qty 10

## 2018-11-16 MED ORDER — SODIUM CHLORIDE 0.9 % IV SOLN
Freq: Once | INTRAVENOUS | Status: AC
  Administered 2018-11-16: 13:00:00 via INTRAVENOUS
  Filled 2018-11-16: qty 250

## 2018-11-16 MED ORDER — HEPARIN SOD (PORK) LOCK FLUSH 100 UNIT/ML IV SOLN
500.0000 [IU] | Freq: Once | INTRAVENOUS | Status: AC | PRN
  Administered 2018-11-16: 500 [IU]
  Filled 2018-11-16: qty 5

## 2018-11-16 NOTE — Progress Notes (Signed)
Creatinine 1.54 reported to Dr. Julien Nordmann. Received OK to treat.

## 2018-11-16 NOTE — Progress Notes (Signed)
[facility] [phone]   [phone]  OFFICE PROGRESS NOTE  [name], MD [place] [address]  DIAGNOSIS: Stage IIIB (T3, N3, M0) non-small cell lung cancer, squamous cell carcinoma presented with large right middle lobe lung mass in addition to right hilar, subcarinal and right supraclavicular lymphadenopathy diagnosed in August 2019.  PRIOR THERAPY: Concurrent chemoradiation with weekly carboplatin for AUC of 2 and paclitaxel 45 NG/M2.  Status post 6 cycles with partial response.   CURRENT THERAPY: Consolidation immunotherapy with Imfinzi 10 mg/KG every 2 weeks started February 28, 2018.  Status post 18 cycles.  INTERVAL HISTORY: Lisa [name] 86 y.o. female returns to the clinic today for follow-up visit.  The patient is feeling fine today with no concerning complaints except for some dental issues and she is followed by her dentist.  She denied having any chest pain, shortness of breath, cough or hemoptysis.  She denied having any recent weight loss or night sweats.  She has no nausea, vomiting, diarrhea or constipation.  She denied having any headache or visual changes.  She has been tolerating her treatment with Imfinzi fairly well.  The patient had repeat CT scan of the chest performed recently and she is here for evaluation and discussion of her scan results before starting cycle #19.   MEDICAL HISTORY: Past Medical History:  Diagnosis Date  . Diverticulosis   . Headache   . Hemorrhoids    internal and external  . Hyperlipidemia 01/29/1991  . Hypertension 10/29/1995  . Hypothyroidism 10/29/1995  . Lung mass    right middle lobe  . Melanoma ([place])    h/o, local excision. no chemo or rady.   . Skin cancer (melanoma) ([place])   . Tubular adenoma of colon   . Wears dentures     ALLERGIES:  is allergic to atorvastatin; celebrex [celecoxib]; cholecalciferol; and simvastatin.  MEDICATIONS:  Current  Outpatient Medications  Medication Sig Dispense Refill  . amLODipine (NORVASC) 10 MG tablet Take 1 tablet by mouth once daily 90 tablet 0  . Cholecalciferol (VITAMIN D) 2000 units tablet Take 2,000 Units by mouth 2 (two) times daily.    . Coenzyme Q10 100 MG capsule Take 100 mg by mouth daily.     Marland Kitchen docusate sodium (COLACE) 100 MG capsule Take 100 mg by mouth daily as needed for mild constipation.    . fluticasone (FLONASE) 50 MCG/ACT nasal spray USE 2 SPRAYS IN EACH NOSTRIL DAILY AS NEEDED FOR ALLERGIES 48 g 3  . levothyroxine (SYNTHROID, LEVOTHROID) 75 MCG tablet Take 1 tablet by mouth once daily 90 tablet 3  . lidocaine-prilocaine (EMLA) cream Apply 1 application topically as needed. 30 g 0  . loratadine (CLARITIN) 10 MG tablet Take 1 tablet (10 mg total) by mouth daily as needed for allergies or rhinitis. (Patient not taking: Reported on 08/11/2018)    . mirtazapine (REMERON) 7.5 MG tablet TAKE 1 TABLET BY MOUTH ONCE DAILY AT BEDTIME 30 tablet 0  . potassium chloride SA (K-DUR,KLOR-CON) 20 MEQ tablet Take 1 tablet (20 mEq total) by mouth 2 (two) times daily. 30 tablet 0  . prochlorperazine (COMPAZINE) 10 MG tablet Take 1 tablet (10 mg total) by mouth every 6 (six) hours as needed for nausea or vomiting. (Patient not taking: Reported on 08/25/2018) 30 tablet 0  . rivaroxaban (XARELTO) 20 MG TABS tablet TAKE 1 TABLET BY MOUTH ONCE DAILY WITH  SUPPER 30 tablet 0  .  sennosides-docusate sodium (SENOKOT-S) 8.6-50 MG tablet Take 1 tablet by mouth daily as needed for constipation.     No current facility-administered medications for this visit.    Facility-Administered Medications Ordered in Other Visits  Medication Dose Route Frequency Provider Last Rate Last Dose  . acetaminophen (TYLENOL) tablet 650 mg  650 mg Oral Once Nicholas Lose, MD        SURGICAL HISTORY:  Past Surgical History:  Procedure Laterality Date  . BRONCHIAL BIOPSY  11/23/2017   Procedure: BRONCHIAL BIOPSIES;  Surgeon:  Grace Isaac, MD;  Location: [facility] OR;  Service: Thoracic;;  . cataract surgery  2007, 2008   repair bilat  . DG BARIUM ENEMA ([place] HX) N/A 2016   [name]  . HERNIA REPAIR  04/25/2001  . IR IMAGING GUIDED PORT INSERTION  07/05/2018  . IR US GUIDE BX ASP/DRAIN  11/17/2017  . KNEE SURGERY     meniscus tear per Dr. Ronnie Derby, R knee  . MULTIPLE TOOTH EXTRACTIONS    . SEPTOPLASTY  2006   Dr. Ernesto Rutherford  . SKIN CANCER EXCISION    . TONSILLECTOMY  1954  . VAGINAL HYSTERECTOMY    . VIDEO BRONCHOSCOPY WITH ENDOBRONCHIAL ULTRASOUND N/A 11/23/2017   Procedure: VIDEO BRONCHOSCOPY WITH ENDOBRONCHIAL ULTRASOUND;  Surgeon: Grace Isaac, MD;  Location: [facility];  Service: Thoracic;  Laterality: N/A;    REVIEW OF SYSTEMS:  Constitutional: positive for fatigue Eyes: negative Ears, nose, mouth, throat, and face: negative Respiratory: positive for dyspnea on exertion Cardiovascular: negative Gastrointestinal: negative Genitourinary:negative Integument/breast: negative Hematologic/lymphatic: negative Musculoskeletal:negative Neurological: negative Behavioral/Psych: negative Endocrine: negative Allergic/Immunologic: negative   PHYSICAL EXAMINATION: General appearance: alert, cooperative and no distress Head: Normocephalic, without obvious abnormality, atraumatic Neck: no adenopathy, no JVD, supple, symmetrical, trachea midline and thyroid not enlarged, symmetric, no tenderness/mass/nodules Lymph nodes: Cervical, supraclavicular, and axillary nodes normal. Resp: clear to auscultation bilaterally Back: symmetric, no curvature. ROM normal. No CVA tenderness. Cardio: regular rate and rhythm, S1, S2 normal, no murmur, click, rub or gallop GI: soft, non-tender; bowel sounds normal; no masses,  no organomegaly Extremities: extremities normal, atraumatic, no cyanosis or edema Neurologic: Alert and oriented X 3, normal strength and tone. Normal symmetric reflexes. Normal coordination and gait  ECOG  PERFORMANCE STATUS: 1 - Symptomatic but completely ambulatory  Blood pressure (!) 133/93, pulse 78, temperature 99.1 F (37.3 C), temperature source Oral, resp. rate 20, height 5\' 8"  (1.727 m), weight 154 lb 1.6 oz (69.9 kg), SpO2 99 %.  LABORATORY DATA: Lab Results  Component Value Date   WBC 5.4 11/16/2018   HGB 11.7 (L) 11/16/2018   HCT 36.7 11/16/2018   MCV 93.9 11/16/2018   PLT 151 11/16/2018      Chemistry      Component Value Date/Time   NA 142 11/02/2018 1231   K 4.1 11/02/2018 1231   CL 109 11/02/2018 1231   CO2 23 11/02/2018 1231   BUN 27 (H) 11/02/2018 1231   CREATININE 1.55 (H) 11/02/2018 1231      Component Value Date/Time   CALCIUM 9.5 11/02/2018 1231   ALKPHOS 87 11/02/2018 1231   AST 23 11/02/2018 1231   ALT 15 11/02/2018 1231   BILITOT 0.4 11/02/2018 1231       RADIOGRAPHIC STUDIES: Ct Chest Wo Contrast  Result Date: 11/11/2018 CLINICAL DATA:  Right lung cancer. EXAM: CT CHEST WITHOUT CONTRAST TECHNIQUE: Multidetector CT imaging of the chest was performed following the standard protocol without IV contrast. COMPARISON:  08/23/2018 FINDINGS: Cardiovascular: Heart  size upper normal. Ascending thoracic aorta measures 4.8 cm diameter, similar to prior. Trace pericardial fluid again noted. Right Port-A-Cath tip is positioned in the distal SVC near the junction with the RA. Mediastinum/Nodes: No mediastinal lymphadenopathy. Abnormal soft tissue attenuation in the right hilum is stable and likely treatment related. No definite left hilar lymphadenopathy on this noncontrast study. The esophagus has normal imaging features. There is no axillary lymphadenopathy. Lungs/Pleura: Centrilobular emphsyema noted. Right parahilar scarring is stable compatible with prior radiation treatment. 6 mm right lower lobe subpleural nodule on 96/7 is unchanged. No new suspicious nodule or mass. Stable small right pleural effusion with loculated components anteriorly at the base and  posteriorly towards the apex. Upper Abdomen: Multiple hypoattenuating lesions in the right kidney are similar to prior and likely cyst. Musculoskeletal: No worrisome lytic or sclerotic osseous abnormality. Inferior endplate compression deformity at T12 is stable. IMPRESSION: 1. Stable exam. Post radiation scarring in the parahilar right lung. No new or progressive interval findings. 2. Small right pleural effusion with areas of loculation. 3. Ascending thoracic aortic aneurysm. Recommend semi-annual imaging followup by CTA or MRA and referral to cardiothoracic surgery if not already obtained. This recommendation follows 2010 ACCF/AHA/AATS/ACR/ASA/SCA/SCAI/SIR/STS/SVM Guidelines for the Diagnosis and Management of Patients With Thoracic Aortic Disease. Circulation. 2010; 121: B262-M355. Aortic aneurysm NOS (ICD10-I71.9) 4.  Emphysema. (ICD10-J43.9) 5.  Aortic Atherosclerois (ICD10-170.0) Electronically Signed   By: Misty Stanley M.D.   On: 11/11/2018 16:17    ASSESSMENT AND PLAN: This is a very pleasant 85 years old white female with stage IIIb non-small cell lung cancer, squamous cell carcinoma.  She is currently undergoing a course of concurrent chemoradiation with weekly carboplatin and paclitaxel status post 6 cycles.  She tolerated this treatment well with partial response. She is currently undergoing consolidation treatment with immunotherapy with Imfinzi status post 18 cycles.   She has been tolerating this treatment well with no concerning adverse effects. The patient had repeat CT scan of the chest performed recently.  I personally and independently reviewed the scan and discussed the results with the patient today. Her scan showed no concerning findings for disease progression. I recommended for her to continue her current treatment with Imfinzi and she will proceed with cycle #19 today. We will continue to monitor her thyroid function closely and adjust her medication if needed. The patient was  advised to call immediately if she has any concerning symptoms in the interval. The patient voices understanding of current disease status and treatment options and is in agreement with the current care plan. All questions were answered. The patient knows to call the clinic with any problems, questions or concerns. We can certainly see the patient much sooner if necessary.  Disclaimer: This note was dictated with voice recognition software. Similar sounding words can inadvertently be transcribed and may not be corrected upon review.

## 2018-11-16 NOTE — Patient Instructions (Signed)
[facility] Discharge Instructions for Patients Receiving Chemotherapy  Today you received the following chemotherapy agents: Imfinzi.  To help prevent nausea and vomiting after your treatment, we encourage you to take your nausea medication as directed.   If you develop nausea and vomiting that is not controlled by your nausea medication, call the clinic.   BELOW ARE SYMPTOMS THAT SHOULD BE REPORTED IMMEDIATELY:  *FEVER GREATER THAN 100.5 F  *CHILLS WITH OR WITHOUT FEVER  NAUSEA AND VOMITING THAT IS NOT CONTROLLED WITH YOUR NAUSEA MEDICATION  *UNUSUAL SHORTNESS OF BREATH  *UNUSUAL BRUISING OR BLEEDING  TENDERNESS IN MOUTH AND THROAT WITH OR WITHOUT PRESENCE OF ULCERS  *URINARY PROBLEMS  *BOWEL PROBLEMS  UNUSUAL RASH Items with * indicate a potential emergency and should be followed up as soon as possible.  Feel free to call the clinic should you have any questions or concerns. The clinic phone number is [phone].  Please show the CHEMO ALERT CARD at check-in to the Emergency Department and triage nurse.   

## 2018-11-16 NOTE — Telephone Encounter (Signed)
Scheduled appt per 8/19 los - added additional cycles to what was already scheduled per los - pt to get an updated schedule next visit

## 2018-11-28 ENCOUNTER — Other Ambulatory Visit: Payer: Self-pay | Admitting: Physician Assistant

## 2018-11-28 ENCOUNTER — Other Ambulatory Visit: Payer: Self-pay | Admitting: Internal Medicine

## 2018-11-28 DIAGNOSIS — R630 Anorexia: Secondary | ICD-10-CM

## 2018-11-28 DIAGNOSIS — I825Z3 Chronic embolism and thrombosis of unspecified deep veins of distal lower extremity, bilateral: Secondary | ICD-10-CM

## 2018-11-30 ENCOUNTER — Inpatient Hospital Stay: Payer: Medicare Other | Attending: Internal Medicine | Admitting: Physician Assistant

## 2018-11-30 ENCOUNTER — Inpatient Hospital Stay: Payer: Medicare Other

## 2018-11-30 ENCOUNTER — Other Ambulatory Visit: Payer: Self-pay

## 2018-11-30 VITALS — BP 139/76 | HR 88 | Temp 98.7°F | Resp 17 | Ht 68.0 in | Wt 153.2 lb

## 2018-11-30 DIAGNOSIS — E039 Hypothyroidism, unspecified: Secondary | ICD-10-CM | POA: Insufficient documentation

## 2018-11-30 DIAGNOSIS — Z7901 Long term (current) use of anticoagulants: Secondary | ICD-10-CM | POA: Diagnosis not present

## 2018-11-30 DIAGNOSIS — E785 Hyperlipidemia, unspecified: Secondary | ICD-10-CM | POA: Insufficient documentation

## 2018-11-30 DIAGNOSIS — C3491 Malignant neoplasm of unspecified part of right bronchus or lung: Secondary | ICD-10-CM

## 2018-11-30 DIAGNOSIS — Z9221 Personal history of antineoplastic chemotherapy: Secondary | ICD-10-CM

## 2018-11-30 DIAGNOSIS — Z923 Personal history of irradiation: Secondary | ICD-10-CM | POA: Diagnosis not present

## 2018-11-30 DIAGNOSIS — C3431 Malignant neoplasm of lower lobe, right bronchus or lung: Secondary | ICD-10-CM

## 2018-11-30 DIAGNOSIS — Z5112 Encounter for antineoplastic immunotherapy: Secondary | ICD-10-CM | POA: Insufficient documentation

## 2018-11-30 DIAGNOSIS — K0381 Cracked tooth: Secondary | ICD-10-CM | POA: Diagnosis not present

## 2018-11-30 DIAGNOSIS — Z79899 Other long term (current) drug therapy: Secondary | ICD-10-CM | POA: Insufficient documentation

## 2018-11-30 DIAGNOSIS — Z23 Encounter for immunization: Secondary | ICD-10-CM | POA: Insufficient documentation

## 2018-11-30 DIAGNOSIS — I10 Essential (primary) hypertension: Secondary | ICD-10-CM | POA: Diagnosis not present

## 2018-11-30 DIAGNOSIS — Z85820 Personal history of malignant melanoma of skin: Secondary | ICD-10-CM | POA: Diagnosis not present

## 2018-11-30 DIAGNOSIS — Z95828 Presence of other vascular implants and grafts: Secondary | ICD-10-CM

## 2018-11-30 LAB — CBC WITH DIFFERENTIAL (CANCER CENTER ONLY)
Abs Immature Granulocytes: 0.02 10*3/uL (ref 0.00–0.07)
Basophils Absolute: 0 10*3/uL (ref 0.0–0.1)
Basophils Relative: 1 %
Eosinophils Absolute: 0.1 10*3/uL (ref 0.0–0.5)
Eosinophils Relative: 1 %
HCT: 36.8 % (ref 36.0–46.0)
Hemoglobin: 11.6 g/dL — ABNORMAL LOW (ref 12.0–15.0)
Immature Granulocytes: 0 %
Lymphocytes Relative: 22 %
Lymphs Abs: 1.1 10*3/uL (ref 0.7–4.0)
MCH: 29.7 pg (ref 26.0–34.0)
MCHC: 31.5 g/dL (ref 30.0–36.0)
MCV: 94.4 fL (ref 80.0–100.0)
Monocytes Absolute: 0.5 10*3/uL (ref 0.1–1.0)
Monocytes Relative: 9 %
Neutro Abs: 3.2 10*3/uL (ref 1.7–7.7)
Neutrophils Relative %: 67 %
Platelet Count: 131 10*3/uL — ABNORMAL LOW (ref 150–400)
RBC: 3.9 MIL/uL (ref 3.87–5.11)
RDW: 13.8 % (ref 11.5–15.5)
WBC Count: 4.9 10*3/uL (ref 4.0–10.5)
nRBC: 0 % (ref 0.0–0.2)

## 2018-11-30 LAB — CMP (CANCER CENTER ONLY)
ALT: 10 U/L (ref 0–44)
AST: 20 U/L (ref 15–41)
Albumin: 3.7 g/dL (ref 3.5–5.0)
Alkaline Phosphatase: 70 U/L (ref 38–126)
Anion gap: 8 (ref 5–15)
BUN: 25 mg/dL — ABNORMAL HIGH (ref 8–23)
CO2: 26 mmol/L (ref 22–32)
Calcium: 9.3 mg/dL (ref 8.9–10.3)
Chloride: 109 mmol/L (ref 98–111)
Creatinine: 1.54 mg/dL — ABNORMAL HIGH (ref 0.44–1.00)
GFR, Est AFR Am: 36 mL/min — ABNORMAL LOW (ref >60)
GFR, Estimated: 31 mL/min — ABNORMAL LOW (ref >60)
Glucose, Bld: 72 mg/dL (ref 70–99)
Potassium: 4.1 mmol/L (ref 3.5–5.1)
Sodium: 143 mmol/L (ref 135–145)
Total Bilirubin: 0.4 mg/dL (ref 0.3–1.2)
Total Protein: 6.7 g/dL (ref 6.5–8.1)

## 2018-11-30 LAB — TSH: TSH: 2.875 u[IU]/mL (ref 0.308–3.960)

## 2018-11-30 MED ORDER — SODIUM CHLORIDE 0.9% FLUSH
10.0000 mL | INTRAVENOUS | Status: DC | PRN
  Administered 2018-11-30: 10 mL
  Filled 2018-11-30: qty 10

## 2018-11-30 MED ORDER — SODIUM CHLORIDE 0.9 % IV SOLN
10.0000 mg/kg | Freq: Once | INTRAVENOUS | Status: AC
  Administered 2018-11-30: 740 mg via INTRAVENOUS
  Filled 2018-11-30: qty 10

## 2018-11-30 MED ORDER — SODIUM CHLORIDE 0.9 % IV SOLN
Freq: Once | INTRAVENOUS | Status: AC
  Administered 2018-11-30: 13:00:00 via INTRAVENOUS
  Filled 2018-11-30: qty 250

## 2018-11-30 MED ORDER — SODIUM CHLORIDE 0.9% FLUSH
10.0000 mL | Freq: Once | INTRAVENOUS | Status: AC
  Administered 2018-11-30: 10 mL
  Filled 2018-11-30: qty 10

## 2018-11-30 MED ORDER — HEPARIN SOD (PORK) LOCK FLUSH 100 UNIT/ML IV SOLN
500.0000 [IU] | Freq: Once | INTRAVENOUS | Status: AC | PRN
  Administered 2018-11-30: 500 [IU]
  Filled 2018-11-30: qty 5

## 2018-11-30 NOTE — Progress Notes (Signed)
Per Dr. Julien Nordmann, ok to proceed with durvalumab with elevated creatinine.

## 2018-11-30 NOTE — Patient Instructions (Signed)
[facility] Discharge Instructions for Patients Receiving Chemotherapy  Today you received the following chemotherapy agents: Imfinzi.  To help prevent nausea and vomiting after your treatment, we encourage you to take your nausea medication as directed.   If you develop nausea and vomiting that is not controlled by your nausea medication, call the clinic.   BELOW ARE SYMPTOMS THAT SHOULD BE REPORTED IMMEDIATELY:  *FEVER GREATER THAN 100.5 F  *CHILLS WITH OR WITHOUT FEVER  NAUSEA AND VOMITING THAT IS NOT CONTROLLED WITH YOUR NAUSEA MEDICATION  *UNUSUAL SHORTNESS OF BREATH  *UNUSUAL BRUISING OR BLEEDING  TENDERNESS IN MOUTH AND THROAT WITH OR WITHOUT PRESENCE OF ULCERS  *URINARY PROBLEMS  *BOWEL PROBLEMS  UNUSUAL RASH Items with * indicate a potential emergency and should be followed up as soon as possible.  Feel free to call the clinic should you have any questions or concerns. The clinic phone number is [phone].  Please show the CHEMO ALERT CARD at check-in to the Emergency Department and triage nurse.   

## 2018-11-30 NOTE — Progress Notes (Signed)
[facility] OFFICE PROGRESS NOTE  Lisa Ghent, MD [place] [address]  DIAGNOSIS: Stage IIIB (T3, N3, M0) non-small cell lung cancer, squamous cell carcinoma presented with large right middle lobe lung mass in addition to right hilar, subcarinal and right supraclavicular lymphadenopathy diagnosed in August 2019  PRIOR THERAPY: Concurrent chemoradiation with weekly carboplatin for AUC of 2 and paclitaxel 45 NG/M2. Status post 6 cycles with partial response   CURRENT THERAPY: Consolidation immunotherapy with Imfinzi 10 mg/KG every 2 weeks started February 28, 2018. Status post 19 cycles.  INTERVAL HISTORY: Lisa [name] 85 y.o. female returns to the clinic for a follow-up visit.  The patient is feeling fairly well today without any concerning complaints except for a "cracked tooth". She cracked her upper front right tooth. She is being followed by her dentist and an oral surgeon for this complaint.  She notes some associated bleeding from the gums.   Regarding her treatment with immunotherapy, the patient continues to tolerate her treatment well without any concerning adverse side effects.  She denies any fever, chills, night sweats, or weight loss.  She denies any chest pain, shortness of breath, cough, or hemoptysis.  She denies any nausea, vomiting, diarrhea, or constipation.  She denies any headache or visual changes.  She denies any rashes or skin changes.  She is here today for evaluation before starting cycle #20.  MEDICAL HISTORY: Past Medical History:  Diagnosis Date  . Diverticulosis   . Headache   . Hemorrhoids    internal and external  . Hyperlipidemia 01/29/1991  . Hypertension 10/29/1995  . Hypothyroidism 10/29/1995  . Lung mass    right middle lobe  . Melanoma ([place])    h/o, local excision. no chemo or rady.   . Skin cancer (melanoma) ([place])   . Tubular adenoma of colon   . Wears dentures     ALLERGIES:  is allergic to  atorvastatin; celebrex [celecoxib]; cholecalciferol; and simvastatin.  MEDICATIONS:  Current Outpatient Medications  Medication Sig Dispense Refill  . amLODipine (NORVASC) 10 MG tablet Take 1 tablet by mouth once daily 90 tablet 0  . Cholecalciferol (VITAMIN D) 2000 units tablet Take 2,000 Units by mouth 2 (two) times daily.    . Coenzyme Q10 100 MG capsule Take 100 mg by mouth daily.     Marland Kitchen docusate sodium (COLACE) 100 MG capsule Take 100 mg by mouth daily as needed for mild constipation.    . fluticasone (FLONASE) 50 MCG/ACT nasal spray USE 2 SPRAYS IN EACH NOSTRIL DAILY AS NEEDED FOR ALLERGIES 48 g 3  . levothyroxine (SYNTHROID, LEVOTHROID) 75 MCG tablet Take 1 tablet by mouth once daily 90 tablet 3  . lidocaine-prilocaine (EMLA) cream Apply 1 application topically as needed. 30 g 0  . mirtazapine (REMERON) 7.5 MG tablet TAKE 1 TABLET BY MOUTH ONCE DAILY AT BEDTIME 30 tablet 0  . potassium chloride SA (K-DUR,KLOR-CON) 20 MEQ tablet Take 1 tablet (20 mEq total) by mouth 2 (two) times daily. 30 tablet 0  . sennosides-docusate sodium (SENOKOT-S) 8.6-50 MG tablet Take 1 tablet by mouth daily as needed for constipation.    [name] 20 MG TABS tablet TAKE 1 TABLET BY MOUTH ONCE DAILY WITH SUPPER 30 tablet 0  . loratadine (CLARITIN) 10 MG tablet Take 1 tablet (10 mg total) by mouth daily as needed for allergies or rhinitis. (Patient not taking: Reported on 08/11/2018)    . prochlorperazine (COMPAZINE) 10 MG tablet Take 1  tablet (10 mg total) by mouth every 6 (six) hours as needed for nausea or vomiting. (Patient not taking: Reported on 08/25/2018) 30 tablet 0   No current facility-administered medications for this visit.    Facility-Administered Medications Ordered in Other Visits  Medication Dose Route Frequency Provider Last Rate Last Dose  . acetaminophen (TYLENOL) tablet 650 mg  650 mg Oral Once Nicholas Lose, MD      . durvalumab (IMFINZI) 740 mg in sodium chloride 0.9 % 100 mL chemo infusion   10 mg/kg (Treatment Plan Recorded) Intravenous Once Curt Bears, MD 115 mL/hr at 11/30/18 1351 740 mg at 11/30/18 1351  . heparin lock flush 100 unit/mL  500 Units Intracatheter Once PRN Curt Bears, MD      . sodium chloride flush (NS) 0.9 % injection 10 mL  10 mL Intracatheter PRN Curt Bears, MD        SURGICAL HISTORY:  Past Surgical History:  Procedure Laterality Date  . BRONCHIAL BIOPSY  11/23/2017   Procedure: BRONCHIAL BIOPSIES;  Surgeon: Grace Isaac, MD;  Location: [facility] OR;  Service: Thoracic;;  . cataract surgery  2007, 2008   repair bilat  . DG BARIUM ENEMA ([place] HX) N/A 2016   [name]  . HERNIA REPAIR  04/25/2001  . IR IMAGING GUIDED PORT INSERTION  07/05/2018  . IR US GUIDE BX ASP/DRAIN  11/17/2017  . KNEE SURGERY     meniscus tear per Dr. Ronnie Derby, R knee  . MULTIPLE TOOTH EXTRACTIONS    . SEPTOPLASTY  2006   Dr. Ernesto Rutherford  . SKIN CANCER EXCISION    . TONSILLECTOMY  1954  . VAGINAL HYSTERECTOMY    . VIDEO BRONCHOSCOPY WITH ENDOBRONCHIAL ULTRASOUND N/A 11/23/2017   Procedure: VIDEO BRONCHOSCOPY WITH ENDOBRONCHIAL ULTRASOUND;  Surgeon: Grace Isaac, MD;  Location: [facility];  Service: Thoracic;  Laterality: N/A;    REVIEW OF SYSTEMS:   Review of Systems  Constitutional: Negative for appetite change, chills, fatigue, fever and unexpected weight change.  HENT: Positive for cracked tooth and associated gingival bleeding. Negative for mouth sores, nosebleeds, sore throat and trouble swallowing.   Eyes: Negative for eye problems and icterus.  Respiratory: Negative for cough, hemoptysis, shortness of breath and wheezing.   Cardiovascular: Negative for chest pain and leg swelling.  Gastrointestinal: Negative for abdominal pain, constipation, diarrhea, nausea and vomiting.  Genitourinary: Negative for bladder incontinence, difficulty urinating, dysuria, frequency and hematuria.   Musculoskeletal: Negative for back pain, gait problem, neck pain and neck  stiffness.  Skin: Negative for itching and rash.  Neurological: Negative for dizziness, extremity weakness, gait problem, headaches, light-headedness and seizures.  Hematological: Negative for adenopathy. Does not bruise/bleed easily.  Psychiatric/Behavioral: Negative for confusion, depression and sleep disturbance. The patient is not nervous/anxious.     PHYSICAL EXAMINATION:  Blood pressure 139/76, pulse 88, temperature 98.7 F (37.1 C), resp. rate 17, height 5\' 8"  (1.727 m), weight 153 lb 3.2 oz (69.5 kg), SpO2 98 %.  ECOG PERFORMANCE STATUS: 1 - Symptomatic but completely ambulatory  Physical Exam  Constitutional: Oriented to person, place, and time and well-developed, well-nourished, and in no distress. No distress.  HENT:  Head: Normocephalic and atraumatic.  Mouth/Throat: Missing right upper front tooth. Poor dentition. Oropharynx is clear and moist. No oropharyngeal exudate.  Eyes: Conjunctivae are normal. Right eye exhibits no discharge. Left eye exhibits no discharge. No scleral icterus.  Neck: Normal range of motion. Neck supple.  Cardiovascular: Normal rate, regular rhythm, normal heart sounds and intact  distal pulses.   Pulmonary/Chest: Effort normal and breath sounds normal. No respiratory distress. No wheezes. No rales.  Abdominal: Soft. Bowel sounds are normal. Exhibits no distension and no mass. There is no tenderness.  Musculoskeletal: Normal range of motion. Exhibits no edema.  Lymphadenopathy:    No cervical adenopathy.  Neurological: Alert and oriented to person, place, and time. Exhibits normal muscle tone. Gait normal. Coordination normal.  Skin: Skin is warm and dry. No rash noted. Not diaphoretic. No erythema. No pallor.  Psychiatric: Mood, memory and judgment normal.  Vitals reviewed.  LABORATORY DATA: Lab Results  Component Value Date   WBC 4.9 11/30/2018   HGB 11.6 (L) 11/30/2018   HCT 36.8 11/30/2018   MCV 94.4 11/30/2018   PLT 131 (L) 11/30/2018       Chemistry      Component Value Date/Time   NA 143 11/30/2018 1206   K 4.1 11/30/2018 1206   CL 109 11/30/2018 1206   CO2 26 11/30/2018 1206   BUN 25 (H) 11/30/2018 1206   CREATININE 1.54 (H) 11/30/2018 1206      Component Value Date/Time   CALCIUM 9.3 11/30/2018 1206   ALKPHOS 70 11/30/2018 1206   AST 20 11/30/2018 1206   ALT 10 11/30/2018 1206   BILITOT 0.4 11/30/2018 1206       RADIOGRAPHIC STUDIES:  Ct Chest Wo Contrast  Result Date: 11/11/2018 CLINICAL DATA:  Right lung cancer. EXAM: CT CHEST WITHOUT CONTRAST TECHNIQUE: Multidetector CT imaging of the chest was performed following the standard protocol without IV contrast. COMPARISON:  08/23/2018 FINDINGS: Cardiovascular: Heart size upper normal. Ascending thoracic aorta measures 4.8 cm diameter, similar to prior. Trace pericardial fluid again noted. Right Port-A-Cath tip is positioned in the distal SVC near the junction with the RA. Mediastinum/Nodes: No mediastinal lymphadenopathy. Abnormal soft tissue attenuation in the right hilum is stable and likely treatment related. No definite left hilar lymphadenopathy on this noncontrast study. The esophagus has normal imaging features. There is no axillary lymphadenopathy. Lungs/Pleura: Centrilobular emphsyema noted. Right parahilar scarring is stable compatible with prior radiation treatment. 6 mm right lower lobe subpleural nodule on 96/7 is unchanged. No new suspicious nodule or mass. Stable small right pleural effusion with loculated components anteriorly at the base and posteriorly towards the apex. Upper Abdomen: Multiple hypoattenuating lesions in the right kidney are similar to prior and likely cyst. Musculoskeletal: No worrisome lytic or sclerotic osseous abnormality. Inferior endplate compression deformity at T12 is stable. IMPRESSION: 1. Stable exam. Post radiation scarring in the parahilar right lung. No new or progressive interval findings. 2. Small right pleural effusion  with areas of loculation. 3. Ascending thoracic aortic aneurysm. Recommend semi-annual imaging followup by CTA or MRA and referral to cardiothoracic surgery if not already obtained. This recommendation follows 2010 ACCF/AHA/AATS/ACR/ASA/SCA/SCAI/SIR/STS/SVM Guidelines for the Diagnosis and Management of Patients With Thoracic Aortic Disease. Circulation. 2010; 121: U932-T557. Aortic aneurysm NOS (ICD10-I71.9) 4.  Emphysema. (ICD10-J43.9) 5.  Aortic Atherosclerois (ICD10-170.0) Electronically Signed   By: Misty Stanley M.D.   On: 11/11/2018 16:17     ASSESSMENT/PLAN:  This is a very pleasant 85 year old Caucasian female with stage IIIb non-small cell lung cancer, squamous cell carcinoma. She presented with a large right lower lobe lung mass in addition to right hilar, subcarinal, and left supraclavicular lymphadenopathy. She was diagnosed in August 2019. She completed a course of concurrent chemoradiation with carboplatin and paclitaxel. She is status post 6 cycles with partial response.  She is currently undergoing consolidation immunotherapy with  Imfinzi 10 mg/kg IV every 2 weeks. She is status post 19cycles. She is tolerating treatment well without any adverse side effects.  Labs were reviewed with the patient. I recommend that she proceed with cycle #20 today as scheduled.   We will see her back for a follow up visit in 2 weeks for evaluation before starting cycle #21.   The patient was advised to call immediately if she has any concerning symptoms in the interval. The patient voices understanding of current disease status and treatment options and is in agreement with the current care plan. All questions were answered. The patient knows to call the clinic with any problems, questions or concerns. We can certainly see the patient much sooner if necessary  No orders of the defined types were placed in this encounter.     L , PA-C 11/30/18

## 2018-12-14 ENCOUNTER — Other Ambulatory Visit: Payer: Self-pay

## 2018-12-14 ENCOUNTER — Inpatient Hospital Stay: Payer: Medicare Other

## 2018-12-14 ENCOUNTER — Inpatient Hospital Stay (HOSPITAL_BASED_OUTPATIENT_CLINIC_OR_DEPARTMENT_OTHER): Payer: Medicare Other | Admitting: Physician Assistant

## 2018-12-14 VITALS — BP 141/87 | HR 92 | Temp 98.3°F | Resp 18 | Ht 68.0 in | Wt 154.2 lb

## 2018-12-14 DIAGNOSIS — E039 Hypothyroidism, unspecified: Secondary | ICD-10-CM | POA: Diagnosis not present

## 2018-12-14 DIAGNOSIS — C3491 Malignant neoplasm of unspecified part of right bronchus or lung: Secondary | ICD-10-CM

## 2018-12-14 DIAGNOSIS — Z85820 Personal history of malignant melanoma of skin: Secondary | ICD-10-CM | POA: Diagnosis not present

## 2018-12-14 DIAGNOSIS — Z79899 Other long term (current) drug therapy: Secondary | ICD-10-CM | POA: Diagnosis not present

## 2018-12-14 DIAGNOSIS — Z23 Encounter for immunization: Secondary | ICD-10-CM | POA: Diagnosis not present

## 2018-12-14 DIAGNOSIS — Z5112 Encounter for antineoplastic immunotherapy: Secondary | ICD-10-CM

## 2018-12-14 DIAGNOSIS — Z9221 Personal history of antineoplastic chemotherapy: Secondary | ICD-10-CM | POA: Diagnosis not present

## 2018-12-14 DIAGNOSIS — Z95828 Presence of other vascular implants and grafts: Secondary | ICD-10-CM

## 2018-12-14 DIAGNOSIS — C3431 Malignant neoplasm of lower lobe, right bronchus or lung: Secondary | ICD-10-CM | POA: Diagnosis not present

## 2018-12-14 DIAGNOSIS — I10 Essential (primary) hypertension: Secondary | ICD-10-CM | POA: Diagnosis not present

## 2018-12-14 DIAGNOSIS — K0381 Cracked tooth: Secondary | ICD-10-CM | POA: Diagnosis not present

## 2018-12-14 DIAGNOSIS — Z923 Personal history of irradiation: Secondary | ICD-10-CM | POA: Diagnosis not present

## 2018-12-14 DIAGNOSIS — E785 Hyperlipidemia, unspecified: Secondary | ICD-10-CM | POA: Diagnosis not present

## 2018-12-14 DIAGNOSIS — Z7901 Long term (current) use of anticoagulants: Secondary | ICD-10-CM | POA: Diagnosis not present

## 2018-12-14 LAB — CBC WITH DIFFERENTIAL (CANCER CENTER ONLY)
Abs Immature Granulocytes: 0.01 10*3/uL (ref 0.00–0.07)
Basophils Absolute: 0.1 10*3/uL (ref 0.0–0.1)
Basophils Relative: 1 %
Eosinophils Absolute: 0.1 10*3/uL (ref 0.0–0.5)
Eosinophils Relative: 1 %
HCT: 37.6 % (ref 36.0–46.0)
Hemoglobin: 11.7 g/dL — ABNORMAL LOW (ref 12.0–15.0)
Immature Granulocytes: 0 %
Lymphocytes Relative: 17 %
Lymphs Abs: 0.9 10*3/uL (ref 0.7–4.0)
MCH: 29.1 pg (ref 26.0–34.0)
MCHC: 31.1 g/dL (ref 30.0–36.0)
MCV: 93.5 fL (ref 80.0–100.0)
Monocytes Absolute: 0.4 10*3/uL (ref 0.1–1.0)
Monocytes Relative: 8 %
Neutro Abs: 3.7 10*3/uL (ref 1.7–7.7)
Neutrophils Relative %: 73 %
Platelet Count: 118 10*3/uL — ABNORMAL LOW (ref 150–400)
RBC: 4.02 MIL/uL (ref 3.87–5.11)
RDW: 13.4 % (ref 11.5–15.5)
WBC Count: 5.2 10*3/uL (ref 4.0–10.5)
nRBC: 0 % (ref 0.0–0.2)

## 2018-12-14 LAB — CMP (CANCER CENTER ONLY)
ALT: 10 U/L (ref 0–44)
AST: 22 U/L (ref 15–41)
Albumin: 3.7 g/dL (ref 3.5–5.0)
Alkaline Phosphatase: 76 U/L (ref 38–126)
Anion gap: 9 (ref 5–15)
BUN: 26 mg/dL — ABNORMAL HIGH (ref 8–23)
CO2: 26 mmol/L (ref 22–32)
Calcium: 9.4 mg/dL (ref 8.9–10.3)
Chloride: 109 mmol/L (ref 98–111)
Creatinine: 1.52 mg/dL — ABNORMAL HIGH (ref 0.44–1.00)
GFR, Est AFR Am: 36 mL/min — ABNORMAL LOW (ref >60)
GFR, Estimated: 31 mL/min — ABNORMAL LOW (ref >60)
Glucose, Bld: 99 mg/dL (ref 70–99)
Potassium: 4 mmol/L (ref 3.5–5.1)
Sodium: 144 mmol/L (ref 135–145)
Total Bilirubin: 0.4 mg/dL (ref 0.3–1.2)
Total Protein: 6.7 g/dL (ref 6.5–8.1)

## 2018-12-14 MED ORDER — SODIUM CHLORIDE 0.9% FLUSH
10.0000 mL | INTRAVENOUS | Status: DC | PRN
  Administered 2018-12-14: 10 mL
  Filled 2018-12-14: qty 10

## 2018-12-14 MED ORDER — SODIUM CHLORIDE 0.9 % IV SOLN
10.0000 mg/kg | Freq: Once | INTRAVENOUS | Status: AC
  Administered 2018-12-14: 740 mg via INTRAVENOUS
  Filled 2018-12-14: qty 4.8

## 2018-12-14 MED ORDER — HEPARIN SOD (PORK) LOCK FLUSH 100 UNIT/ML IV SOLN
500.0000 [IU] | Freq: Once | INTRAVENOUS | Status: AC | PRN
  Administered 2018-12-14: 500 [IU]
  Filled 2018-12-14: qty 5

## 2018-12-14 MED ORDER — SODIUM CHLORIDE 0.9% FLUSH
10.0000 mL | Freq: Once | INTRAVENOUS | Status: AC
  Administered 2018-12-14: 10 mL
  Filled 2018-12-14: qty 10

## 2018-12-14 MED ORDER — SODIUM CHLORIDE 0.9 % IV SOLN
Freq: Once | INTRAVENOUS | Status: AC
  Administered 2018-12-14: 14:00:00 via INTRAVENOUS
  Filled 2018-12-14: qty 250

## 2018-12-14 NOTE — Patient Instructions (Signed)
[facility] Discharge Instructions for Patients Receiving Chemotherapy  Today you received the following chemotherapy agents: Imfinzi.  To help prevent nausea and vomiting after your treatment, we encourage you to take your nausea medication as directed.   If you develop nausea and vomiting that is not controlled by your nausea medication, call the clinic.   BELOW ARE SYMPTOMS THAT SHOULD BE REPORTED IMMEDIATELY:  *FEVER GREATER THAN 100.5 F  *CHILLS WITH OR WITHOUT FEVER  NAUSEA AND VOMITING THAT IS NOT CONTROLLED WITH YOUR NAUSEA MEDICATION  *UNUSUAL SHORTNESS OF BREATH  *UNUSUAL BRUISING OR BLEEDING  TENDERNESS IN MOUTH AND THROAT WITH OR WITHOUT PRESENCE OF ULCERS  *URINARY PROBLEMS  *BOWEL PROBLEMS  UNUSUAL RASH Items with * indicate a potential emergency and should be followed up as soon as possible.  Feel free to call the clinic should you have any questions or concerns. The clinic phone number is [phone].  Please show the CHEMO ALERT CARD at check-in to the Emergency Department and triage nurse.   

## 2018-12-14 NOTE — Progress Notes (Signed)
Okay to proceed with treatment of Imfinzi per Cassie Heilingoepter, PA.

## 2018-12-14 NOTE — Progress Notes (Signed)
[facility] OFFICE PROGRESS NOTE  [name], MD [place] [address]  DIAGNOSIS: Stage IIIB (T3, N3, M0) non-small cell lung cancer, squamous cell carcinoma presented with large right middle lobe lung mass in addition to right hilar, subcarinal and right supraclavicular lymphadenopathy diagnosed in August 2019  PRIOR THERAPY: Concurrent chemoradiation with weekly carboplatin for AUC of 2 and paclitaxel 45 NG/M2. Status post 6 cycles with partial response  CURRENT THERAPY: Consolidation immunotherapy with Imfinzi 10 mg/KG every 2 weeks started February 28, 2018. Status post 20 cycles.  INTERVAL HISTORY: Lisa [name] 85 y.o. female returns to the clinic for a follow up visit. The patient is feeling well today without any concerning complaints. The patient continues to tolerate treatment with immunotherapy with Imfinzi well without any adverse side effects. Denies any fever, chills, night sweats, or weight loss. Denies any chest pain, shortness of breath, cough, or hemoptysis. Denies any nausea, vomiting, diarrhea, or constipation. Denies any headache or visual changes. Denies any rashes or skin changes. The patient is here today for evaluation prior to starting cycle # 21    MEDICAL HISTORY: Past Medical History:  Diagnosis Date  . Diverticulosis   . Headache   . Hemorrhoids    internal and external  . Hyperlipidemia 01/29/1991  . Hypertension 10/29/1995  . Hypothyroidism 10/29/1995  . Lung mass    right middle lobe  . Melanoma ([place])    h/o, local excision. no chemo or rady.   . Skin cancer (melanoma) ([place])   . Tubular adenoma of colon   . Wears dentures     ALLERGIES:  is allergic to atorvastatin; celebrex [celecoxib]; cholecalciferol; and simvastatin.  MEDICATIONS:  Current Outpatient Medications  Medication Sig Dispense Refill  . amLODipine (NORVASC) 10 MG tablet Take 1 tablet by mouth once daily 90 tablet 0  . Cholecalciferol  (VITAMIN D) 2000 units tablet Take 2,000 Units by mouth 2 (two) times daily.    . Coenzyme Q10 100 MG capsule Take 100 mg by mouth daily.     Marland Kitchen docusate sodium (COLACE) 100 MG capsule Take 100 mg by mouth daily as needed for mild constipation.    . fluticasone (FLONASE) 50 MCG/ACT nasal spray USE 2 SPRAYS IN EACH NOSTRIL DAILY AS NEEDED FOR ALLERGIES 48 g 3  . levothyroxine (SYNTHROID, LEVOTHROID) 75 MCG tablet Take 1 tablet by mouth once daily 90 tablet 3  . lidocaine-prilocaine (EMLA) cream Apply 1 application topically as needed. 30 g 0  . mirtazapine (REMERON) 7.5 MG tablet TAKE 1 TABLET BY MOUTH ONCE DAILY AT BEDTIME 30 tablet 0  . sennosides-docusate sodium (SENOKOT-S) 8.6-50 MG tablet Take 1 tablet by mouth daily as needed for constipation.    [name] 20 MG TABS tablet TAKE 1 TABLET BY MOUTH ONCE DAILY WITH SUPPER 30 tablet 0  . loratadine (CLARITIN) 10 MG tablet Take 1 tablet (10 mg total) by mouth daily as needed for allergies or rhinitis. (Patient not taking: Reported on 08/11/2018)    . prochlorperazine (COMPAZINE) 10 MG tablet Take 1 tablet (10 mg total) by mouth every 6 (six) hours as needed for nausea or vomiting. (Patient not taking: Reported on 08/25/2018) 30 tablet 0   No current facility-administered medications for this visit.    Facility-Administered Medications Ordered in Other Visits  Medication Dose Route Frequency Provider Last Rate Last Dose  . acetaminophen (TYLENOL) tablet 650 mg  650 mg Oral Once Nicholas Lose, MD      .  sodium chloride flush (NS) 0.9 % injection 10 mL  10 mL Intracatheter PRN Curt Bears, MD   10 mL at 12/14/18 1557    SURGICAL HISTORY:  Past Surgical History:  Procedure Laterality Date  . BRONCHIAL BIOPSY  11/23/2017   Procedure: BRONCHIAL BIOPSIES;  Surgeon: Grace Isaac, MD;  Location: [facility] OR;  Service: Thoracic;;  . cataract surgery  2007, 2008   repair bilat  . DG BARIUM ENEMA ([place] HX) N/A 2016   [name]  . HERNIA REPAIR   04/25/2001  . IR IMAGING GUIDED PORT INSERTION  07/05/2018  . IR US GUIDE BX ASP/DRAIN  11/17/2017  . KNEE SURGERY     meniscus tear per Dr. Ronnie Derby, R knee  . MULTIPLE TOOTH EXTRACTIONS    . SEPTOPLASTY  2006   Dr. Ernesto Rutherford  . SKIN CANCER EXCISION    . TONSILLECTOMY  1954  . VAGINAL HYSTERECTOMY    . VIDEO BRONCHOSCOPY WITH ENDOBRONCHIAL ULTRASOUND N/A 11/23/2017   Procedure: VIDEO BRONCHOSCOPY WITH ENDOBRONCHIAL ULTRASOUND;  Surgeon: Grace Isaac, MD;  Location: [facility];  Service: Thoracic;  Laterality: N/A;    REVIEW OF SYSTEMS:   Review of Systems  Constitutional: Negative for appetite change, chills, fatigue, fever and unexpected weight change.  HENT:   Negative for mouth sores, nosebleeds, sore throat and trouble swallowing.   Eyes: Negative for eye problems and icterus.  Respiratory: Negative for cough, hemoptysis, shortness of breath and wheezing.   Cardiovascular: Negative for chest pain and leg swelling.  Gastrointestinal: Negative for abdominal pain, constipation, diarrhea, nausea and vomiting.  Genitourinary: Negative for bladder incontinence, difficulty urinating, dysuria, frequency and hematuria.   Musculoskeletal: Negative for back pain, gait problem, neck pain and neck stiffness.  Skin: Negative for itching and rash.  Neurological: Negative for dizziness, extremity weakness, gait problem, headaches, light-headedness and seizures.  Hematological: Negative for adenopathy. Does not bruise/bleed easily.  Psychiatric/Behavioral: Negative for confusion, depression and sleep disturbance. The patient is not nervous/anxious.     PHYSICAL EXAMINATION:  Blood pressure (!) 141/87, pulse 92, temperature 98.3 F (36.8 C), temperature source Oral, resp. rate 18, height 5\' 8"  (1.727 m), weight 154 lb 3.2 oz (69.9 kg), SpO2 98 %.  ECOG PERFORMANCE STATUS: 1 - Symptomatic but completely ambulatory  Physical Exam  Constitutional: Oriented to person, place, and time and  well-developed, well-nourished, and in no distress.  HENT:  Head: Normocephalic and atraumatic.  Mouth/Throat: Oropharynx is clear and moist. No oropharyngeal exudate.  Eyes: Conjunctivae are normal. Right eye exhibits no discharge. Left eye exhibits no discharge. No scleral icterus.  Neck: Normal range of motion. Neck supple.  Cardiovascular: Normal rate, regular rhythm, normal heart sounds and intact distal pulses.   Pulmonary/Chest: Effort normal and breath sounds normal. No respiratory distress. No wheezes. No rales.  Abdominal: Soft. Bowel sounds are normal. Exhibits no distension and no mass. There is no tenderness.  Musculoskeletal: Normal range of motion. Exhibits no edema.  Lymphadenopathy:    No cervical adenopathy.  Neurological: Alert and oriented to person, place, and time. Exhibits normal muscle tone. Gait normal. Coordination normal.  Skin: Skin is warm and dry. No rash noted. Not diaphoretic. No erythema. No pallor.  Psychiatric: Mood, memory and judgment normal.  Vitals reviewed.  LABORATORY DATA: Lab Results  Component Value Date   WBC 5.2 12/14/2018   HGB 11.7 (L) 12/14/2018   HCT 37.6 12/14/2018   MCV 93.5 12/14/2018   PLT 118 (L) 12/14/2018      Chemistry  Component Value Date/Time   NA 144 12/14/2018 1258   K 4.0 12/14/2018 1258   CL 109 12/14/2018 1258   CO2 26 12/14/2018 1258   BUN 26 (H) 12/14/2018 1258   CREATININE 1.52 (H) 12/14/2018 1258      Component Value Date/Time   CALCIUM 9.4 12/14/2018 1258   ALKPHOS 76 12/14/2018 1258   AST 22 12/14/2018 1258   ALT 10 12/14/2018 1258   BILITOT 0.4 12/14/2018 1258       RADIOGRAPHIC STUDIES:  No results found.   ASSESSMENT/PLAN:  This is a very pleasant 85 year old Caucasian female with stage IIIb non-small cell lung cancer, squamous cell carcinoma. She presented with a large right lower lobe lung mass in addition to right hilar, subcarinal, and left supraclavicular lymphadenopathy. She was  diagnosed in August 2019. She completed a course of concurrent chemoradiation with carboplatin and paclitaxel. She is status post 6 cycles with a partial response.  She is currently undergoing consolidation immunotherapy with Imfinzi 10 mg/kg IV every 2 weeks. She is status post 20cycles. She is tolerating treatment well without any adverse side effects.  Labs were reviewed with the patient. I recommend that she proceed with cycle #21 today as scheduled.   We will see her back for a follow up visit in 2 weeks for evaluation before starting cycle #22.  The patient was advised to call immediately if she has any concerning symptoms in the interval. The patient voices understanding of current disease status and treatment options and is in agreement with the current care plan. All questions were answered. The patient knows to call the clinic with any problems, questions or concerns. We can certainly see the patient much sooner if necessary   Orders Placed This Encounter  Procedures  . TSH    Standing Status:   Standing    Number of Occurrences:   3    Standing Expiration Date:   12/14/2019     Lisa Gural L Rufus Cypert, PA-C 12/14/18

## 2018-12-28 ENCOUNTER — Inpatient Hospital Stay: Payer: Medicare Other

## 2018-12-28 ENCOUNTER — Encounter: Payer: Self-pay | Admitting: Internal Medicine

## 2018-12-28 ENCOUNTER — Inpatient Hospital Stay: Payer: Medicare Other | Admitting: Internal Medicine

## 2018-12-28 ENCOUNTER — Telehealth: Payer: Self-pay | Admitting: Internal Medicine

## 2018-12-28 ENCOUNTER — Other Ambulatory Visit: Payer: Self-pay

## 2018-12-28 VITALS — BP 136/92 | HR 87 | Temp 99.1°F | Resp 18 | Ht 68.0 in | Wt 153.7 lb

## 2018-12-28 DIAGNOSIS — Z23 Encounter for immunization: Secondary | ICD-10-CM | POA: Diagnosis not present

## 2018-12-28 DIAGNOSIS — C3491 Malignant neoplasm of unspecified part of right bronchus or lung: Secondary | ICD-10-CM

## 2018-12-28 DIAGNOSIS — Z7901 Long term (current) use of anticoagulants: Secondary | ICD-10-CM | POA: Diagnosis not present

## 2018-12-28 DIAGNOSIS — I10 Essential (primary) hypertension: Secondary | ICD-10-CM | POA: Diagnosis not present

## 2018-12-28 DIAGNOSIS — Z5112 Encounter for antineoplastic immunotherapy: Secondary | ICD-10-CM

## 2018-12-28 DIAGNOSIS — Z923 Personal history of irradiation: Secondary | ICD-10-CM | POA: Diagnosis not present

## 2018-12-28 DIAGNOSIS — E039 Hypothyroidism, unspecified: Secondary | ICD-10-CM | POA: Diagnosis not present

## 2018-12-28 DIAGNOSIS — Z9221 Personal history of antineoplastic chemotherapy: Secondary | ICD-10-CM | POA: Diagnosis not present

## 2018-12-28 DIAGNOSIS — Z95828 Presence of other vascular implants and grafts: Secondary | ICD-10-CM

## 2018-12-28 DIAGNOSIS — C3431 Malignant neoplasm of lower lobe, right bronchus or lung: Secondary | ICD-10-CM | POA: Diagnosis not present

## 2018-12-28 DIAGNOSIS — K0381 Cracked tooth: Secondary | ICD-10-CM | POA: Diagnosis not present

## 2018-12-28 DIAGNOSIS — E785 Hyperlipidemia, unspecified: Secondary | ICD-10-CM | POA: Diagnosis not present

## 2018-12-28 DIAGNOSIS — Z79899 Other long term (current) drug therapy: Secondary | ICD-10-CM | POA: Diagnosis not present

## 2018-12-28 DIAGNOSIS — Z85820 Personal history of malignant melanoma of skin: Secondary | ICD-10-CM | POA: Diagnosis not present

## 2018-12-28 LAB — CMP (CANCER CENTER ONLY)
ALT: 12 U/L (ref 0–44)
AST: 22 U/L (ref 15–41)
Albumin: 3.6 g/dL (ref 3.5–5.0)
Alkaline Phosphatase: 91 U/L (ref 38–126)
Anion gap: 10 (ref 5–15)
BUN: 26 mg/dL — ABNORMAL HIGH (ref 8–23)
CO2: 24 mmol/L (ref 22–32)
Calcium: 9.5 mg/dL (ref 8.9–10.3)
Chloride: 108 mmol/L (ref 98–111)
Creatinine: 1.58 mg/dL — ABNORMAL HIGH (ref 0.44–1.00)
GFR, Est AFR Am: 34 mL/min — ABNORMAL LOW (ref >60)
GFR, Estimated: 30 mL/min — ABNORMAL LOW (ref >60)
Glucose, Bld: 101 mg/dL — ABNORMAL HIGH (ref 70–99)
Potassium: 3.8 mmol/L (ref 3.5–5.1)
Sodium: 142 mmol/L (ref 135–145)
Total Bilirubin: 0.6 mg/dL (ref 0.3–1.2)
Total Protein: 6.7 g/dL (ref 6.5–8.1)

## 2018-12-28 LAB — TSH: TSH: 4.233 u[IU]/mL — ABNORMAL HIGH (ref 0.308–3.960)

## 2018-12-28 LAB — CBC WITH DIFFERENTIAL (CANCER CENTER ONLY)
Abs Immature Granulocytes: 0.01 10*3/uL (ref 0.00–0.07)
Basophils Absolute: 0 10*3/uL (ref 0.0–0.1)
Basophils Relative: 1 %
Eosinophils Absolute: 0 10*3/uL (ref 0.0–0.5)
Eosinophils Relative: 1 %
HCT: 36.4 % (ref 36.0–46.0)
Hemoglobin: 11.5 g/dL — ABNORMAL LOW (ref 12.0–15.0)
Immature Granulocytes: 0 %
Lymphocytes Relative: 14 %
Lymphs Abs: 0.7 10*3/uL (ref 0.7–4.0)
MCH: 29.6 pg (ref 26.0–34.0)
MCHC: 31.6 g/dL (ref 30.0–36.0)
MCV: 93.6 fL (ref 80.0–100.0)
Monocytes Absolute: 0.4 10*3/uL (ref 0.1–1.0)
Monocytes Relative: 9 %
Neutro Abs: 3.8 10*3/uL (ref 1.7–7.7)
Neutrophils Relative %: 75 %
Platelet Count: 120 10*3/uL — ABNORMAL LOW (ref 150–400)
RBC: 3.89 MIL/uL (ref 3.87–5.11)
RDW: 13.8 % (ref 11.5–15.5)
WBC Count: 5 10*3/uL (ref 4.0–10.5)
nRBC: 0 % (ref 0.0–0.2)

## 2018-12-28 MED ORDER — INFLUENZA VAC A&B SA ADJ QUAD 0.5 ML IM PRSY
PREFILLED_SYRINGE | INTRAMUSCULAR | Status: AC
  Filled 2018-12-28: qty 0.5

## 2018-12-28 MED ORDER — SODIUM CHLORIDE 0.9 % IV SOLN
10.0000 mg/kg | Freq: Once | INTRAVENOUS | Status: AC
  Administered 2018-12-28: 740 mg via INTRAVENOUS
  Filled 2018-12-28: qty 4.8

## 2018-12-28 MED ORDER — SODIUM CHLORIDE 0.9% FLUSH
10.0000 mL | Freq: Once | INTRAVENOUS | Status: AC
  Administered 2018-12-28: 10 mL
  Filled 2018-12-28: qty 10

## 2018-12-28 MED ORDER — HEPARIN SOD (PORK) LOCK FLUSH 100 UNIT/ML IV SOLN
500.0000 [IU] | Freq: Once | INTRAVENOUS | Status: AC | PRN
  Administered 2018-12-28: 500 [IU]
  Filled 2018-12-28: qty 5

## 2018-12-28 MED ORDER — SODIUM CHLORIDE 0.9% FLUSH
10.0000 mL | INTRAVENOUS | Status: DC | PRN
  Administered 2018-12-28: 10 mL
  Filled 2018-12-28: qty 10

## 2018-12-28 MED ORDER — INFLUENZA VAC A&B SA ADJ QUAD 0.5 ML IM PRSY
0.5000 mL | PREFILLED_SYRINGE | Freq: Once | INTRAMUSCULAR | Status: AC
  Administered 2018-12-28: 0.5 mL via INTRAMUSCULAR

## 2018-12-28 MED ORDER — SODIUM CHLORIDE 0.9 % IV SOLN
Freq: Once | INTRAVENOUS | Status: AC
  Administered 2018-12-28: 14:00:00 via INTRAVENOUS
  Filled 2018-12-28: qty 250

## 2018-12-28 NOTE — Patient Instructions (Signed)
[facility] Discharge Instructions for Patients Receiving Chemotherapy  Today you received the following chemotherapy agents: Imfinzi.  To help prevent nausea and vomiting after your treatment, we encourage you to take your nausea medication as directed.   If you develop nausea and vomiting that is not controlled by your nausea medication, call the clinic.   BELOW ARE SYMPTOMS THAT SHOULD BE REPORTED IMMEDIATELY:  *FEVER GREATER THAN 100.5 F  *CHILLS WITH OR WITHOUT FEVER  NAUSEA AND VOMITING THAT IS NOT CONTROLLED WITH YOUR NAUSEA MEDICATION  *UNUSUAL SHORTNESS OF BREATH  *UNUSUAL BRUISING OR BLEEDING  TENDERNESS IN MOUTH AND THROAT WITH OR WITHOUT PRESENCE OF ULCERS  *URINARY PROBLEMS  *BOWEL PROBLEMS  UNUSUAL RASH Items with * indicate a potential emergency and should be followed up as soon as possible.  Feel free to call the clinic should you have any questions or concerns. The clinic phone number is [phone].  Please show the CHEMO ALERT CARD at check-in to the Emergency Department and triage nurse.   

## 2018-12-28 NOTE — Telephone Encounter (Signed)
Scheduled appt per 9/30 los - pt to get an updated schedule next visit.

## 2018-12-28 NOTE — Progress Notes (Signed)
[facility] [phone]) [phone]   Fax:([phone]  OFFICE PROGRESS NOTE  [name], MD [place] [address]  DIAGNOSIS: Stage IIIB (T3, N3, M0) non-small cell lung cancer, squamous cell carcinoma presented with large right middle lobe lung mass in addition to right hilar, subcarinal and right supraclavicular lymphadenopathy diagnosed in August 2019.  PRIOR THERAPY: Concurrent chemoradiation with weekly carboplatin for AUC of 2 and paclitaxel 45 NG/M2.  Status post 6 cycles with partial response.   CURRENT THERAPY: Consolidation immunotherapy with Imfinzi 10 mg/KG every 2 weeks started February 28, 2018.  Status post 21 cycles.  INTERVAL HISTORY: Lisa [name] 85 y.o. female returns to the clinic today for follow-up visit.  The patient is feeling fine today with no concerning complaints except for mild shortness of breath with exertion.  She denied having any fever or chills.  She has no nausea, vomiting, diarrhea or constipation.  She denied having any chest pain, cough or hemoptysis.  She denied having any weight loss or night sweats.  She continues to tolerate her treatment with Imfinzi fairly well.  She is here today for evaluation before starting cycle #22.  MEDICAL HISTORY: Past Medical History:  Diagnosis Date  . Diverticulosis   . Headache   . Hemorrhoids    internal and external  . Hyperlipidemia 01/29/1991  . Hypertension 10/29/1995  . Hypothyroidism 10/29/1995  . Lung mass    right middle lobe  . Melanoma ([place])    h/o, local excision. no chemo or rady.   . Skin cancer (melanoma) ([place])   . Tubular adenoma of colon   . Wears dentures     ALLERGIES:  is allergic to atorvastatin; celebrex [celecoxib]; cholecalciferol; and simvastatin.  MEDICATIONS:  Current Outpatient Medications  Medication Sig Dispense Refill  . amLODipine (NORVASC) 10 MG tablet Take 1 tablet by mouth once daily 90 tablet 0  . Cholecalciferol  (VITAMIN D) 2000 units tablet Take 2,000 Units by mouth 2 (two) times daily.    . Coenzyme Q10 100 MG capsule Take 100 mg by mouth daily.     Marland Kitchen docusate sodium (COLACE) 100 MG capsule Take 100 mg by mouth daily as needed for mild constipation.    . fluticasone (FLONASE) 50 MCG/ACT nasal spray USE 2 SPRAYS IN EACH NOSTRIL DAILY AS NEEDED FOR ALLERGIES 48 g 3  . levothyroxine (SYNTHROID, LEVOTHROID) 75 MCG tablet Take 1 tablet by mouth once daily 90 tablet 3  . lidocaine-prilocaine (EMLA) cream Apply 1 application topically as needed. 30 g 0  . loratadine (CLARITIN) 10 MG tablet Take 1 tablet (10 mg total) by mouth daily as needed for allergies or rhinitis. (Patient not taking: Reported on 08/11/2018)    . mirtazapine (REMERON) 7.5 MG tablet TAKE 1 TABLET BY MOUTH ONCE DAILY AT BEDTIME 30 tablet 0  . prochlorperazine (COMPAZINE) 10 MG tablet Take 1 tablet (10 mg total) by mouth every 6 (six) hours as needed for nausea or vomiting. (Patient not taking: Reported on 08/25/2018) 30 tablet 0  . sennosides-docusate sodium (SENOKOT-S) 8.6-50 MG tablet Take 1 tablet by mouth daily as needed for constipation.    [name] 20 MG TABS tablet TAKE 1 TABLET BY MOUTH ONCE DAILY WITH SUPPER 30 tablet 0   No current facility-administered medications for this visit.    Facility-Administered Medications Ordered in Other Visits  Medication Dose Route Frequency Provider Last Rate Last Dose  . acetaminophen (TYLENOL) tablet 650 mg  650 mg Oral Once Nicholas Lose, MD        SURGICAL HISTORY:  Past Surgical History:  Procedure Laterality Date  . BRONCHIAL BIOPSY  11/23/2017   Procedure: BRONCHIAL BIOPSIES;  Surgeon: Grace Isaac, MD;  Location: [facility] OR;  Service: Thoracic;;  . cataract surgery  2007, 2008   repair bilat  . DG BARIUM ENEMA ([place] HX) N/A 2016   [name]  . HERNIA REPAIR  04/25/2001  . IR IMAGING GUIDED PORT INSERTION  07/05/2018  . IR US GUIDE BX ASP/DRAIN  11/17/2017  . KNEE SURGERY      meniscus tear per Dr. Ronnie Derby, R knee  . MULTIPLE TOOTH EXTRACTIONS    . SEPTOPLASTY  2006   Dr. Ernesto Rutherford  . SKIN CANCER EXCISION    . TONSILLECTOMY  1954  . VAGINAL HYSTERECTOMY    . VIDEO BRONCHOSCOPY WITH ENDOBRONCHIAL ULTRASOUND N/A 11/23/2017   Procedure: VIDEO BRONCHOSCOPY WITH ENDOBRONCHIAL ULTRASOUND;  Surgeon: Grace Isaac, MD;  Location: [facility] OR;  Service: Thoracic;  Laterality: N/A;    REVIEW OF SYSTEMS:  A comprehensive review of systems was negative except for: Respiratory: positive for dyspnea on exertion   PHYSICAL EXAMINATION: General appearance: alert, cooperative and no distress Head: Normocephalic, without obvious abnormality, atraumatic Neck: no adenopathy, no JVD, supple, symmetrical, trachea midline and thyroid not enlarged, symmetric, no tenderness/mass/nodules Lymph nodes: Cervical, supraclavicular, and axillary nodes normal. Resp: clear to auscultation bilaterally Back: symmetric, no curvature. ROM normal. No CVA tenderness. Cardio: regular rate and rhythm, S1, S2 normal, no murmur, click, rub or gallop GI: soft, non-tender; bowel sounds normal; no masses,  no organomegaly Extremities: extremities normal, atraumatic, no cyanosis or edema  ECOG PERFORMANCE STATUS: 1 - Symptomatic but completely ambulatory  Blood pressure (!) 136/92, pulse 87, temperature 99.1 F (37.3 C), temperature source Temporal, resp. rate 18, height 5\' 8"  (1.727 m), weight 153 lb 11.2 oz (69.7 kg), SpO2 99 %.  LABORATORY DATA: Lab Results  Component Value Date   WBC 5.0 12/28/2018   HGB 11.5 (L) 12/28/2018   HCT 36.4 12/28/2018   MCV 93.6 12/28/2018   PLT 120 (L) 12/28/2018      Chemistry      Component Value Date/Time   NA 144 12/14/2018 1258   K 4.0 12/14/2018 1258   CL 109 12/14/2018 1258   CO2 26 12/14/2018 1258   BUN 26 (H) 12/14/2018 1258   CREATININE 1.52 (H) 12/14/2018 1258      Component Value Date/Time   CALCIUM 9.4 12/14/2018 1258   ALKPHOS 76 12/14/2018 1258    AST 22 12/14/2018 1258   ALT 10 12/14/2018 1258   BILITOT 0.4 12/14/2018 1258       RADIOGRAPHIC STUDIES: No results found.  ASSESSMENT AND PLAN: This is a very pleasant 85 years old white female with stage IIIb non-small cell lung cancer, squamous cell carcinoma.  She is currently undergoing a course of concurrent chemoradiation with weekly carboplatin and paclitaxel status post 6 cycles.  She tolerated this treatment well with partial response. She is currently undergoing consolidation treatment with immunotherapy with Imfinzi status post 21 cycles.   She has been tolerating this treatment well with no concerning adverse effects. I recommended for her to proceed with cycle #22 today as planned. I will see her back for follow-up visit in 2 weeks for evaluation before the next cycle of her treatment. She was advised to call immediately if she has any concerning symptoms in the interval. The patient voices  understanding of current disease status and treatment options and is in agreement with the current care plan. All questions were answered. The patient knows to call the clinic with any problems, questions or concerns. We can certainly see the patient much sooner if necessary.  Disclaimer: This note was dictated with voice recognition software. Similar sounding words can inadvertently be transcribed and may not be corrected upon review.

## 2018-12-28 NOTE — Progress Notes (Signed)
Per Dr. Mckinley Jewel ok to treat with reatinine of 1.58

## 2019-01-02 ENCOUNTER — Other Ambulatory Visit: Payer: Self-pay | Admitting: Physician Assistant

## 2019-01-02 DIAGNOSIS — I825Z3 Chronic embolism and thrombosis of unspecified deep veins of distal lower extremity, bilateral: Secondary | ICD-10-CM

## 2019-01-10 ENCOUNTER — Other Ambulatory Visit: Payer: Self-pay

## 2019-01-10 DIAGNOSIS — C3491 Malignant neoplasm of unspecified part of right bronchus or lung: Secondary | ICD-10-CM

## 2019-01-11 ENCOUNTER — Inpatient Hospital Stay: Payer: Medicare Other | Attending: Internal Medicine

## 2019-01-11 ENCOUNTER — Inpatient Hospital Stay: Payer: Medicare Other

## 2019-01-11 ENCOUNTER — Encounter: Payer: Self-pay | Admitting: Internal Medicine

## 2019-01-11 ENCOUNTER — Inpatient Hospital Stay (HOSPITAL_BASED_OUTPATIENT_CLINIC_OR_DEPARTMENT_OTHER): Payer: Medicare Other | Admitting: Internal Medicine

## 2019-01-11 ENCOUNTER — Other Ambulatory Visit: Payer: Self-pay

## 2019-01-11 VITALS — BP 117/88 | HR 93 | Temp 98.9°F | Resp 18 | Ht 68.0 in | Wt 153.7 lb

## 2019-01-11 DIAGNOSIS — E039 Hypothyroidism, unspecified: Secondary | ICD-10-CM | POA: Diagnosis not present

## 2019-01-11 DIAGNOSIS — Z7901 Long term (current) use of anticoagulants: Secondary | ICD-10-CM | POA: Diagnosis not present

## 2019-01-11 DIAGNOSIS — Z9221 Personal history of antineoplastic chemotherapy: Secondary | ICD-10-CM | POA: Insufficient documentation

## 2019-01-11 DIAGNOSIS — E785 Hyperlipidemia, unspecified: Secondary | ICD-10-CM | POA: Insufficient documentation

## 2019-01-11 DIAGNOSIS — C3491 Malignant neoplasm of unspecified part of right bronchus or lung: Secondary | ICD-10-CM

## 2019-01-11 DIAGNOSIS — C3431 Malignant neoplasm of lower lobe, right bronchus or lung: Secondary | ICD-10-CM | POA: Insufficient documentation

## 2019-01-11 DIAGNOSIS — Z923 Personal history of irradiation: Secondary | ICD-10-CM | POA: Insufficient documentation

## 2019-01-11 DIAGNOSIS — Z79899 Other long term (current) drug therapy: Secondary | ICD-10-CM | POA: Insufficient documentation

## 2019-01-11 DIAGNOSIS — Z85820 Personal history of malignant melanoma of skin: Secondary | ICD-10-CM | POA: Diagnosis not present

## 2019-01-11 DIAGNOSIS — I10 Essential (primary) hypertension: Secondary | ICD-10-CM | POA: Diagnosis not present

## 2019-01-11 DIAGNOSIS — Z5112 Encounter for antineoplastic immunotherapy: Secondary | ICD-10-CM | POA: Diagnosis not present

## 2019-01-11 DIAGNOSIS — Z95828 Presence of other vascular implants and grafts: Secondary | ICD-10-CM

## 2019-01-11 LAB — CBC WITH DIFFERENTIAL (CANCER CENTER ONLY)
Abs Immature Granulocytes: 0.01 10*3/uL (ref 0.00–0.07)
Basophils Absolute: 0 10*3/uL (ref 0.0–0.1)
Basophils Relative: 1 %
Eosinophils Absolute: 0 10*3/uL (ref 0.0–0.5)
Eosinophils Relative: 1 %
HCT: 34.9 % — ABNORMAL LOW (ref 36.0–46.0)
Hemoglobin: 11 g/dL — ABNORMAL LOW (ref 12.0–15.0)
Immature Granulocytes: 0 %
Lymphocytes Relative: 14 %
Lymphs Abs: 0.8 10*3/uL (ref 0.7–4.0)
MCH: 28.9 pg (ref 26.0–34.0)
MCHC: 31.5 g/dL (ref 30.0–36.0)
MCV: 91.6 fL (ref 80.0–100.0)
Monocytes Absolute: 0.4 10*3/uL (ref 0.1–1.0)
Monocytes Relative: 7 %
Neutro Abs: 4.6 10*3/uL (ref 1.7–7.7)
Neutrophils Relative %: 77 %
Platelet Count: 145 10*3/uL — ABNORMAL LOW (ref 150–400)
RBC: 3.81 MIL/uL — ABNORMAL LOW (ref 3.87–5.11)
RDW: 13.3 % (ref 11.5–15.5)
WBC Count: 5.9 10*3/uL (ref 4.0–10.5)
nRBC: 0 % (ref 0.0–0.2)

## 2019-01-11 LAB — CMP (CANCER CENTER ONLY)
ALT: 7 U/L (ref 0–44)
AST: 16 U/L (ref 15–41)
Albumin: 3.5 g/dL (ref 3.5–5.0)
Alkaline Phosphatase: 85 U/L (ref 38–126)
Anion gap: 10 (ref 5–15)
BUN: 23 mg/dL (ref 8–23)
CO2: 25 mmol/L (ref 22–32)
Calcium: 9.4 mg/dL (ref 8.9–10.3)
Chloride: 108 mmol/L (ref 98–111)
Creatinine: 1.52 mg/dL — ABNORMAL HIGH (ref 0.44–1.00)
GFR, Est AFR Am: 36 mL/min — ABNORMAL LOW (ref >60)
GFR, Estimated: 31 mL/min — ABNORMAL LOW (ref >60)
Glucose, Bld: 71 mg/dL (ref 70–99)
Potassium: 3.9 mmol/L (ref 3.5–5.1)
Sodium: 143 mmol/L (ref 135–145)
Total Bilirubin: 0.5 mg/dL (ref 0.3–1.2)
Total Protein: 6.8 g/dL (ref 6.5–8.1)

## 2019-01-11 LAB — TSH: TSH: 3.582 u[IU]/mL (ref 0.308–3.960)

## 2019-01-11 MED ORDER — SODIUM CHLORIDE 0.9% FLUSH
10.0000 mL | INTRAVENOUS | Status: DC | PRN
  Administered 2019-01-11: 10 mL
  Filled 2019-01-11: qty 10

## 2019-01-11 MED ORDER — SODIUM CHLORIDE 0.9 % IV SOLN
10.0000 mg/kg | Freq: Once | INTRAVENOUS | Status: AC
  Administered 2019-01-11: 740 mg via INTRAVENOUS
  Filled 2019-01-11: qty 4.8

## 2019-01-11 MED ORDER — HEPARIN SOD (PORK) LOCK FLUSH 100 UNIT/ML IV SOLN
500.0000 [IU] | Freq: Once | INTRAVENOUS | Status: AC | PRN
  Administered 2019-01-11: 500 [IU]
  Filled 2019-01-11: qty 5

## 2019-01-11 MED ORDER — SODIUM CHLORIDE 0.9 % IV SOLN
Freq: Once | INTRAVENOUS | Status: AC
  Administered 2019-01-11: 13:00:00 via INTRAVENOUS
  Filled 2019-01-11: qty 250

## 2019-01-11 MED ORDER — SODIUM CHLORIDE 0.9% FLUSH
10.0000 mL | Freq: Once | INTRAVENOUS | Status: AC
  Administered 2019-01-11: 10 mL
  Filled 2019-01-11: qty 10

## 2019-01-11 NOTE — Patient Instructions (Signed)
[facility] Discharge Instructions for Patients Receiving Chemotherapy  Today you received the following chemotherapy agents: Imfinzi.  To help prevent nausea and vomiting after your treatment, we encourage you to take your nausea medication as directed.   If you develop nausea and vomiting that is not controlled by your nausea medication, call the clinic.   BELOW ARE SYMPTOMS THAT SHOULD BE REPORTED IMMEDIATELY:  *FEVER GREATER THAN 100.5 F  *CHILLS WITH OR WITHOUT FEVER  NAUSEA AND VOMITING THAT IS NOT CONTROLLED WITH YOUR NAUSEA MEDICATION  *UNUSUAL SHORTNESS OF BREATH  *UNUSUAL BRUISING OR BLEEDING  TENDERNESS IN MOUTH AND THROAT WITH OR WITHOUT PRESENCE OF ULCERS  *URINARY PROBLEMS  *BOWEL PROBLEMS  UNUSUAL RASH Items with * indicate a potential emergency and should be followed up as soon as possible.  Feel free to call the clinic should you have any questions or concerns. The clinic phone number is [phone].  Please show the CHEMO ALERT CARD at check-in to the Emergency Department and triage nurse.   

## 2019-01-11 NOTE — Progress Notes (Signed)
Per Dr Julien Nordmann ok to tx with creatinine 1.52 today.

## 2019-01-11 NOTE — Progress Notes (Signed)
[facility] Telephone:([phone]) [phone]   Fax:(336) [phone]  OFFICE PROGRESS NOTE  [name], MD [place] [address]  DIAGNOSIS: Stage IIIB (T3, N3, M0) non-small cell lung cancer, squamous cell carcinoma presented with large right middle lobe lung mass in addition to right hilar, subcarinal and right supraclavicular lymphadenopathy diagnosed in August 2019.  PRIOR THERAPY: Concurrent chemoradiation with weekly carboplatin for AUC of 2 and paclitaxel 45 NG/M2.  Status post 6 cycles with partial response.   CURRENT THERAPY: Consolidation immunotherapy with Imfinzi 10 mg/KG every 2 weeks started February 28, 2018.  Status post 22 cycles.  INTERVAL HISTORY: Lisa [name] 85 y.o. female returns to the clinic today for follow-up visit.  The patient is feeling fine today with no concerning complaints.  She denied having any chest pain, shortness of breath, cough or hemoptysis.  She denied having any fever or chills.  She has no nausea, vomiting, diarrhea or constipation.  She has no headache or visual changes.  She continues to tolerate her treatment with Imfinzi fairly well.  She is here today for evaluation before starting cycle #23.   MEDICAL HISTORY: Past Medical History:  Diagnosis Date  . Diverticulosis   . Headache   . Hemorrhoids    internal and external  . Hyperlipidemia 01/29/1991  . Hypertension 10/29/1995  . Hypothyroidism 10/29/1995  . Lung mass    right middle lobe  . Melanoma ([place])    h/o, local excision. no chemo or rady.   . Skin cancer (melanoma) ([place])   . Tubular adenoma of colon   . Wears dentures     ALLERGIES:  is allergic to atorvastatin; celebrex [celecoxib]; cholecalciferol; and simvastatin.  MEDICATIONS:  Current Outpatient Medications  Medication Sig Dispense Refill  . amLODipine (NORVASC) 10 MG tablet Take 1 tablet by mouth once daily 90 tablet 0  . Cholecalciferol (VITAMIN D) 2000 units tablet Take  2,000 Units by mouth 2 (two) times daily.    . Coenzyme Q10 100 MG capsule Take 100 mg by mouth daily.     Marland Kitchen docusate sodium (COLACE) 100 MG capsule Take 100 mg by mouth daily as needed for mild constipation.    . fluticasone (FLONASE) 50 MCG/ACT nasal spray USE 2 SPRAYS IN EACH NOSTRIL DAILY AS NEEDED FOR ALLERGIES 48 g 3  . levothyroxine (SYNTHROID, LEVOTHROID) 75 MCG tablet Take 1 tablet by mouth once daily 90 tablet 3  . lidocaine-prilocaine (EMLA) cream Apply 1 application topically as needed. 30 g 0  . loratadine (CLARITIN) 10 MG tablet Take 1 tablet (10 mg total) by mouth daily as needed for allergies or rhinitis. (Patient not taking: Reported on 08/11/2018)    . mirtazapine (REMERON) 7.5 MG tablet TAKE 1 TABLET BY MOUTH ONCE DAILY AT BEDTIME 30 tablet 0  . prochlorperazine (COMPAZINE) 10 MG tablet Take 1 tablet (10 mg total) by mouth every 6 (six) hours as needed for nausea or vomiting. (Patient not taking: Reported on 08/25/2018) 30 tablet 0  . sennosides-docusate sodium (SENOKOT-S) 8.6-50 MG tablet Take 1 tablet by mouth daily as needed for constipation.    [name] 20 MG TABS tablet TAKE 1 TABLET BY MOUTH ONCE DAILY WITH SUPPER 30 tablet 2   No current facility-administered medications for this visit.    Facility-Administered Medications Ordered in Other Visits  Medication Dose Route Frequency Provider Last Rate Last Dose  . acetaminophen (TYLENOL) tablet 650 mg  650 mg Oral Once [place],  Loleta Dicker, MD        SURGICAL HISTORY:  Past Surgical History:  Procedure Laterality Date  . BRONCHIAL BIOPSY  11/23/2017   Procedure: BRONCHIAL BIOPSIES;  Surgeon: Grace Isaac, MD;  Location: [facility] OR;  Service: Thoracic;;  . cataract surgery  2007, 2008   repair bilat  . DG BARIUM ENEMA ([place] HX) N/A 2016   [name]  . HERNIA REPAIR  04/25/2001  . IR IMAGING GUIDED PORT INSERTION  07/05/2018  . IR US GUIDE BX ASP/DRAIN  11/17/2017  . KNEE SURGERY     meniscus tear per Dr. Ronnie Derby, R knee  .  MULTIPLE TOOTH EXTRACTIONS    . SEPTOPLASTY  2006   Dr. Ernesto Rutherford  . SKIN CANCER EXCISION    . TONSILLECTOMY  1954  . VAGINAL HYSTERECTOMY    . VIDEO BRONCHOSCOPY WITH ENDOBRONCHIAL ULTRASOUND N/A 11/23/2017   Procedure: VIDEO BRONCHOSCOPY WITH ENDOBRONCHIAL ULTRASOUND;  Surgeon: Grace Isaac, MD;  Location: [facility];  Service: Thoracic;  Laterality: N/A;    REVIEW OF SYSTEMS:  A comprehensive review of systems was negative.   PHYSICAL EXAMINATION: General appearance: alert, cooperative and no distress Head: Normocephalic, without obvious abnormality, atraumatic Neck: no adenopathy, no JVD, supple, symmetrical, trachea midline and thyroid not enlarged, symmetric, no tenderness/mass/nodules Lymph nodes: Cervical, supraclavicular, and axillary nodes normal. Resp: clear to auscultation bilaterally Back: symmetric, no curvature. ROM normal. No CVA tenderness. Cardio: regular rate and rhythm, S1, S2 normal, no murmur, click, rub or gallop GI: soft, non-tender; bowel sounds normal; no masses,  no organomegaly Extremities: extremities normal, atraumatic, no cyanosis or edema  ECOG PERFORMANCE STATUS: 1 - Symptomatic but completely ambulatory  Blood pressure 117/88, pulse 93, temperature 98.9 F (37.2 C), temperature source Temporal, resp. rate 18, height 5\' 8"  (1.727 m), weight 153 lb 11.2 oz (69.7 kg), SpO2 99 %.  LABORATORY DATA: Lab Results  Component Value Date   WBC 5.9 01/11/2019   HGB 11.0 (L) 01/11/2019   HCT 34.9 (L) 01/11/2019   MCV 91.6 01/11/2019   PLT 145 (L) 01/11/2019      Chemistry      Component Value Date/Time   NA 142 12/28/2018 1214   K 3.8 12/28/2018 1214   CL 108 12/28/2018 1214   CO2 24 12/28/2018 1214   BUN 26 (H) 12/28/2018 1214   CREATININE 1.58 (H) 12/28/2018 1214      Component Value Date/Time   CALCIUM 9.5 12/28/2018 1214   ALKPHOS 91 12/28/2018 1214   AST 22 12/28/2018 1214   ALT 12 12/28/2018 1214   BILITOT 0.6 12/28/2018 1214        RADIOGRAPHIC STUDIES: No results found.  ASSESSMENT AND PLAN: This is a very pleasant 85 years old white female with stage IIIb non-small cell lung cancer, squamous cell carcinoma.  She is currently undergoing a course of concurrent chemoradiation with weekly carboplatin and paclitaxel status post 6 cycles.  She tolerated this treatment well with partial response. She is currently undergoing consolidation treatment with immunotherapy with Imfinzi status post 22 cycles.   The patient continues to tolerate her treatment well with no concerning adverse effects. I recommended for her to proceed with cycle #23 today as planned. I will see her back for follow-up visit in 2 weeks for evaluation before starting cycle #24. The patient was advised to call immediately if she has any concerning symptoms in the interval. The patient voices understanding of current disease status and treatment options and is in agreement with the  current care plan. All questions were answered. The patient knows to call the clinic with any problems, questions or concerns. We can certainly see the patient much sooner if necessary.  Disclaimer: This note was dictated with voice recognition software. Similar sounding words can inadvertently be transcribed and may not be corrected upon review.

## 2019-01-13 ENCOUNTER — Other Ambulatory Visit: Payer: Self-pay | Admitting: Internal Medicine

## 2019-01-13 ENCOUNTER — Other Ambulatory Visit: Payer: Self-pay | Admitting: Family Medicine

## 2019-01-13 DIAGNOSIS — R630 Anorexia: Secondary | ICD-10-CM

## 2019-01-25 ENCOUNTER — Inpatient Hospital Stay: Payer: Medicare Other

## 2019-01-25 ENCOUNTER — Other Ambulatory Visit: Payer: Self-pay

## 2019-01-25 ENCOUNTER — Inpatient Hospital Stay (HOSPITAL_BASED_OUTPATIENT_CLINIC_OR_DEPARTMENT_OTHER): Payer: Medicare Other | Admitting: Physician Assistant

## 2019-01-25 VITALS — BP 150/86 | HR 100 | Temp 98.3°F | Resp 18 | Ht 68.0 in | Wt 156.1 lb

## 2019-01-25 DIAGNOSIS — Z85820 Personal history of malignant melanoma of skin: Secondary | ICD-10-CM | POA: Diagnosis not present

## 2019-01-25 DIAGNOSIS — C3491 Malignant neoplasm of unspecified part of right bronchus or lung: Secondary | ICD-10-CM

## 2019-01-25 DIAGNOSIS — Z5112 Encounter for antineoplastic immunotherapy: Secondary | ICD-10-CM

## 2019-01-25 DIAGNOSIS — Z9221 Personal history of antineoplastic chemotherapy: Secondary | ICD-10-CM | POA: Diagnosis not present

## 2019-01-25 DIAGNOSIS — C3431 Malignant neoplasm of lower lobe, right bronchus or lung: Secondary | ICD-10-CM | POA: Diagnosis not present

## 2019-01-25 DIAGNOSIS — Z95828 Presence of other vascular implants and grafts: Secondary | ICD-10-CM

## 2019-01-25 DIAGNOSIS — E039 Hypothyroidism, unspecified: Secondary | ICD-10-CM | POA: Diagnosis not present

## 2019-01-25 DIAGNOSIS — Z79899 Other long term (current) drug therapy: Secondary | ICD-10-CM | POA: Diagnosis not present

## 2019-01-25 DIAGNOSIS — Z923 Personal history of irradiation: Secondary | ICD-10-CM | POA: Diagnosis not present

## 2019-01-25 DIAGNOSIS — E785 Hyperlipidemia, unspecified: Secondary | ICD-10-CM | POA: Diagnosis not present

## 2019-01-25 DIAGNOSIS — I10 Essential (primary) hypertension: Secondary | ICD-10-CM | POA: Diagnosis not present

## 2019-01-25 DIAGNOSIS — Z7901 Long term (current) use of anticoagulants: Secondary | ICD-10-CM | POA: Diagnosis not present

## 2019-01-25 LAB — CBC WITH DIFFERENTIAL (CANCER CENTER ONLY)
Abs Immature Granulocytes: 0.02 10*3/uL (ref 0.00–0.07)
Basophils Absolute: 0 10*3/uL (ref 0.0–0.1)
Basophils Relative: 1 %
Eosinophils Absolute: 0.1 10*3/uL (ref 0.0–0.5)
Eosinophils Relative: 1 %
HCT: 35.8 % — ABNORMAL LOW (ref 36.0–46.0)
Hemoglobin: 11.3 g/dL — ABNORMAL LOW (ref 12.0–15.0)
Immature Granulocytes: 0 %
Lymphocytes Relative: 17 %
Lymphs Abs: 0.9 10*3/uL (ref 0.7–4.0)
MCH: 29.4 pg (ref 26.0–34.0)
MCHC: 31.6 g/dL (ref 30.0–36.0)
MCV: 93 fL (ref 80.0–100.0)
Monocytes Absolute: 0.4 10*3/uL (ref 0.1–1.0)
Monocytes Relative: 7 %
Neutro Abs: 4 10*3/uL (ref 1.7–7.7)
Neutrophils Relative %: 74 %
Platelet Count: 150 10*3/uL (ref 150–400)
RBC: 3.85 MIL/uL — ABNORMAL LOW (ref 3.87–5.11)
RDW: 13.5 % (ref 11.5–15.5)
WBC Count: 5.4 10*3/uL (ref 4.0–10.5)
nRBC: 0 % (ref 0.0–0.2)

## 2019-01-25 LAB — CMP (CANCER CENTER ONLY)
ALT: 9 U/L (ref 0–44)
AST: 18 U/L (ref 15–41)
Albumin: 3.4 g/dL — ABNORMAL LOW (ref 3.5–5.0)
Alkaline Phosphatase: 77 U/L (ref 38–126)
Anion gap: 11 (ref 5–15)
BUN: 30 mg/dL — ABNORMAL HIGH (ref 8–23)
CO2: 24 mmol/L (ref 22–32)
Calcium: 9.4 mg/dL (ref 8.9–10.3)
Chloride: 109 mmol/L (ref 98–111)
Creatinine: 1.65 mg/dL — ABNORMAL HIGH (ref 0.44–1.00)
GFR, Est AFR Am: 33 mL/min — ABNORMAL LOW (ref >60)
GFR, Estimated: 28 mL/min — ABNORMAL LOW (ref >60)
Glucose, Bld: 122 mg/dL — ABNORMAL HIGH (ref 70–99)
Potassium: 4.1 mmol/L (ref 3.5–5.1)
Sodium: 144 mmol/L (ref 135–145)
Total Bilirubin: 0.4 mg/dL (ref 0.3–1.2)
Total Protein: 6.7 g/dL (ref 6.5–8.1)

## 2019-01-25 MED ORDER — SODIUM CHLORIDE 0.9 % IV SOLN
Freq: Once | INTRAVENOUS | Status: AC
  Administered 2019-01-25: 15:00:00 via INTRAVENOUS
  Filled 2019-01-25: qty 250

## 2019-01-25 MED ORDER — SODIUM CHLORIDE 0.9% FLUSH
10.0000 mL | Freq: Once | INTRAVENOUS | Status: AC
  Administered 2019-01-25: 10 mL
  Filled 2019-01-25: qty 10

## 2019-01-25 MED ORDER — SODIUM CHLORIDE 0.9% FLUSH
10.0000 mL | INTRAVENOUS | Status: DC | PRN
  Administered 2019-01-25: 10 mL
  Filled 2019-01-25: qty 10

## 2019-01-25 MED ORDER — HEPARIN SOD (PORK) LOCK FLUSH 100 UNIT/ML IV SOLN
500.0000 [IU] | Freq: Once | INTRAVENOUS | Status: AC | PRN
  Administered 2019-01-25: 500 [IU]
  Filled 2019-01-25: qty 5

## 2019-01-25 MED ORDER — SODIUM CHLORIDE 0.9 % IV SOLN
10.0000 mg/kg | Freq: Once | INTRAVENOUS | Status: AC
  Administered 2019-01-25: 740 mg via INTRAVENOUS
  Filled 2019-01-25: qty 4.8

## 2019-01-25 NOTE — Progress Notes (Signed)
[facility] OFFICE PROGRESS NOTE  [name], MD [address] [place] [address]  DIAGNOSIS: Stage IIIB (T3, N3, M0) non-small cell lung cancer, squamous cell carcinoma presented with large right middle lobe lung mass in addition to right hilar, subcarinal and right supraclavicular lymphadenopathy diagnosed in August 2019  PRIOR THERAPY: Concurrent chemoradiation with weekly carboplatin for AUC of 2 and paclitaxel 45 NG/M2. Status post 6 cycles with partial response  CURRENT THERAPY: Consolidation immunotherapy with Imfinzi 10 mg/KG every 2 weeks started February 28, 2018. Status post 23cycles.  INTERVAL HISTORY: Lisa [name] 85 y.o. female returns to the clinic for a follow up visit. The patient is feeling well today without any concerning complaints except for seasonal allergies, nasal congestion/drainage, and sinus problems related to the "weather changing" which is not uncommon for her. She takes claritin and flonase. She denies any sore throat, facial pain/pressure, cough, or fevers. The patient continues to tolerate treatment with immunotherapy with Imfinzi well without any adverse effects. Denies any chills, night sweats, or weight loss. Denies any chest pain, shortness of breath, or hemoptysis. Denies any nausea, vomiting, diarrhea, or constipation. Denies any headache or visual changes. Denies any rashes or skin changes. The patient is here today for evaluation prior to starting cycle # 24   MEDICAL HISTORY: Past Medical History:  Diagnosis Date  . Diverticulosis   . Headache   . Hemorrhoids    internal and external  . Hyperlipidemia 01/29/1991  . Hypertension 10/29/1995  . Hypothyroidism 10/29/1995  . Lung mass    right middle lobe  . Melanoma ([place])    h/o, local excision. no chemo or rady.   . Skin cancer (melanoma) ([place])   . Tubular adenoma of colon   . Wears dentures     ALLERGIES:  is allergic to atorvastatin; celebrex [celecoxib];  cholecalciferol; and simvastatin.  MEDICATIONS:  Current Outpatient Medications  Medication Sig Dispense Refill  . amLODipine (NORVASC) 10 MG tablet Take 1 tablet by mouth once daily 90 tablet 2  . Cholecalciferol (VITAMIN D) 2000 units tablet Take 2,000 Units by mouth 2 (two) times daily.    . Coenzyme Q10 100 MG capsule Take 100 mg by mouth daily.     Marland Kitchen docusate sodium (COLACE) 100 MG capsule Take 100 mg by mouth daily as needed for mild constipation.    . fluticasone (FLONASE) 50 MCG/ACT nasal spray USE 2 SPRAYS IN EACH NOSTRIL DAILY AS NEEDED FOR ALLERGIES 48 g 3  . levothyroxine (SYNTHROID, LEVOTHROID) 75 MCG tablet Take 1 tablet by mouth once daily 90 tablet 3  . lidocaine-prilocaine (EMLA) cream Apply 1 application topically as needed. 30 g 0  . loratadine (CLARITIN) 10 MG tablet Take 1 tablet (10 mg total) by mouth daily as needed for allergies or rhinitis. (Patient not taking: Reported on 08/11/2018)    . mirtazapine (REMERON) 7.5 MG tablet TAKE 1 TABLET BY MOUTH ONCE DAILY AT BEDTIME 30 tablet 0  . prochlorperazine (COMPAZINE) 10 MG tablet Take 1 tablet (10 mg total) by mouth every 6 (six) hours as needed for nausea or vomiting. (Patient not taking: Reported on 08/25/2018) 30 tablet 0  . sennosides-docusate sodium (SENOKOT-S) 8.6-50 MG tablet Take 1 tablet by mouth daily as needed for constipation.    [name] 20 MG TABS tablet TAKE 1 TABLET BY MOUTH ONCE DAILY WITH SUPPER 30 tablet 2   No current facility-administered medications for this visit.    Facility-Administered Medications Ordered in Other  Visits  Medication Dose Route Frequency Provider Last Rate Last Dose  . acetaminophen (TYLENOL) tablet 650 mg  650 mg Oral Once Nicholas Lose, MD      . durvalumab (IMFINZI) 740 mg in sodium chloride 0.9 % 100 mL chemo infusion  10 mg/kg (Treatment Plan Recorded) Intravenous Once Curt Bears, MD 115 mL/hr at 01/25/19 1522 740 mg at 01/25/19 1522  . heparin lock flush 100 unit/mL  500  Units Intracatheter Once PRN Curt Bears, MD      . sodium chloride flush (NS) 0.9 % injection 10 mL  10 mL Intracatheter PRN Curt Bears, MD        SURGICAL HISTORY:  Past Surgical History:  Procedure Laterality Date  . BRONCHIAL BIOPSY  11/23/2017   Procedure: BRONCHIAL BIOPSIES;  Surgeon: Grace Isaac, MD;  Location: [facility] OR;  Service: Thoracic;;  . cataract surgery  2007, 2008   repair bilat  . DG BARIUM ENEMA ([place] HX) N/A 2016   [name]  . HERNIA REPAIR  04/25/2001  . IR IMAGING GUIDED PORT INSERTION  07/05/2018  . IR US GUIDE BX ASP/DRAIN  11/17/2017  . KNEE SURGERY     meniscus tear per Dr. Ronnie Derby, R knee  . MULTIPLE TOOTH EXTRACTIONS    . SEPTOPLASTY  2006   Dr. Ernesto Rutherford  . SKIN CANCER EXCISION    . TONSILLECTOMY  1954  . VAGINAL HYSTERECTOMY    . VIDEO BRONCHOSCOPY WITH ENDOBRONCHIAL ULTRASOUND N/A 11/23/2017   Procedure: VIDEO BRONCHOSCOPY WITH ENDOBRONCHIAL ULTRASOUND;  Surgeon: Grace Isaac, MD;  Location: [place];  Service: Thoracic;  Laterality: N/A;    REVIEW OF SYSTEMS:   Review of Systems  Constitutional: Negative for appetite change, chills, fatigue, fever and unexpected weight change.  HENT: Positive for nasal congestion/post nasal drainage. Negative for mouth sores, nosebleeds, sore throat and trouble swallowing.   Eyes: Negative for eye problems and icterus.  Respiratory: Negative for cough, hemoptysis, shortness of breath and wheezing.   Cardiovascular: Negative for chest pain and leg swelling.  Gastrointestinal: Negative for abdominal pain, constipation, diarrhea, nausea and vomiting.  Genitourinary: Negative for bladder incontinence, difficulty urinating, dysuria, frequency and hematuria.   Musculoskeletal: Negative for back pain, gait problem, neck pain and neck stiffness.  Skin: Negative for itching and rash.  Neurological: Negative for dizziness, extremity weakness, gait problem, headaches, light-headedness and seizures.   Hematological: Negative for adenopathy. Does not bruise/bleed easily.  Psychiatric/Behavioral: Negative for confusion, depression and sleep disturbance. The patient is not nervous/anxious.     PHYSICAL EXAMINATION:  Blood pressure (!) 150/86, pulse 100, temperature 98.3 F (36.8 C), temperature source Oral, resp. rate 18, height 5\' 8"  (1.727 m), weight 156 lb 1.6 oz (70.8 kg), SpO2 98 %.  ECOG PERFORMANCE STATUS: 1 - Symptomatic but completely ambulatory  Physical Exam  Constitutional: Oriented to person, place, and time and well-developed, well-nourished, and in no distress.  HENT:  Head: Normocephalic and atraumatic.  Mouth/Throat: Oropharynx is clear and moist. No oropharyngeal exudate.  Eyes: Conjunctivae are normal. Right eye exhibits no discharge. Left eye exhibits no discharge. No scleral icterus.  Neck: Normal range of motion. Neck supple.  Cardiovascular: Normal rate, regular rhythm, normal heart sounds and intact distal pulses.   Pulmonary/Chest: Effort normal and breath sounds normal. No respiratory distress. No wheezes. No rales.  Abdominal: Soft. Bowel sounds are normal. Exhibits no distension and no mass. There is no tenderness.  Musculoskeletal: Normal range of motion. Exhibits no edema.  Lymphadenopathy:  No cervical adenopathy.  Neurological: Alert and oriented to person, place, and time. Exhibits normal muscle tone. Gait normal. Coordination normal.  Skin: Skin is warm and dry. No rash noted. Not diaphoretic. No erythema. No pallor.  Psychiatric: Mood, memory and judgment normal.  Vitals reviewed.  LABORATORY DATA: Lab Results  Component Value Date   WBC 5.4 01/25/2019   HGB 11.3 (L) 01/25/2019   HCT 35.8 (L) 01/25/2019   MCV 93.0 01/25/2019   PLT 150 01/25/2019      Chemistry      Component Value Date/Time   NA 144 01/25/2019 1345   K 4.1 01/25/2019 1345   CL 109 01/25/2019 1345   CO2 24 01/25/2019 1345   BUN 30 (H) 01/25/2019 1345   CREATININE  1.65 (H) 01/25/2019 1345      Component Value Date/Time   CALCIUM 9.4 01/25/2019 1345   ALKPHOS 77 01/25/2019 1345   AST 18 01/25/2019 1345   ALT 9 01/25/2019 1345   BILITOT 0.4 01/25/2019 1345       RADIOGRAPHIC STUDIES:  No results found.   ASSESSMENT/PLAN:  This is a very pleasant 85 year old Caucasian female with stage IIIb non-small cell lung cancer, squamous cell carcinoma. She presented with a large right lower lobe lung mass in addition to right hilar, subcarinal, and left supraclavicular lymphadenopathy. She was diagnosed in August 2019. She completed a course of concurrent chemoradiation with carboplatin and paclitaxel. She is status post 6 cycles with a partial response.  She is currently undergoing consolidation immunotherapy with Imfinzi 10 mg/kg IV every 2 weeks. She is status post 23cycles. She is tolerating treatment well without any adverse side effects.  Labs were reviewed with the patient. I recommend that she proceed with cycle #24 today as scheduled.   We will see her back for a follow up visit in 2 weeks for evaluation before starting cycle #25.  The patient was advised to call immediately if she has any concerning symptoms in the interval. The patient voices understanding of current disease status and treatment options and is in agreement with the current care plan. All questions were answered. The patient knows to call the clinic with any problems, questions or concerns. We can certainly see the patient much sooner if necessary No orders of the defined types were placed in this encounter.    Lisa [name] L Vasti Yagi, PA-C 01/25/19

## 2019-01-25 NOTE — Progress Notes (Signed)
Per Dr. Julien Nordmann okay for patient to receive treatment today with creatine 1.65

## 2019-01-25 NOTE — Patient Instructions (Signed)
[facility] Discharge Instructions for Patients Receiving Chemotherapy  Today you received the following chemotherapy agents: Imfinzi.  To help prevent nausea and vomiting after your treatment, we encourage you to take your nausea medication as directed.   If you develop nausea and vomiting that is not controlled by your nausea medication, call the clinic.   BELOW ARE SYMPTOMS THAT SHOULD BE REPORTED IMMEDIATELY:  *FEVER GREATER THAN 100.5 F  *CHILLS WITH OR WITHOUT FEVER  NAUSEA AND VOMITING THAT IS NOT CONTROLLED WITH YOUR NAUSEA MEDICATION  *UNUSUAL SHORTNESS OF BREATH  *UNUSUAL BRUISING OR BLEEDING  TENDERNESS IN MOUTH AND THROAT WITH OR WITHOUT PRESENCE OF ULCERS  *URINARY PROBLEMS  *BOWEL PROBLEMS  UNUSUAL RASH Items with * indicate a potential emergency and should be followed up as soon as possible.  Feel free to call the clinic should you have any questions or concerns. The clinic phone number is [phone].  Please show the CHEMO ALERT CARD at check-in to the Emergency Department and triage nurse.   

## 2019-01-26 ENCOUNTER — Telehealth: Payer: Self-pay | Admitting: Physician Assistant

## 2019-01-26 NOTE — Telephone Encounter (Signed)
Scheduled per los. Called and had bad connection. Mailed printout

## 2019-02-08 ENCOUNTER — Inpatient Hospital Stay: Payer: Medicare Other | Attending: Internal Medicine

## 2019-02-08 ENCOUNTER — Other Ambulatory Visit: Payer: Self-pay

## 2019-02-08 ENCOUNTER — Inpatient Hospital Stay (HOSPITAL_BASED_OUTPATIENT_CLINIC_OR_DEPARTMENT_OTHER): Payer: Medicare Other | Admitting: Internal Medicine

## 2019-02-08 ENCOUNTER — Encounter: Payer: Self-pay | Admitting: Internal Medicine

## 2019-02-08 ENCOUNTER — Inpatient Hospital Stay: Payer: Medicare Other

## 2019-02-08 VITALS — BP 126/68 | HR 94 | Temp 98.3°F | Resp 17 | Ht 68.0 in | Wt 154.3 lb

## 2019-02-08 DIAGNOSIS — Z5112 Encounter for antineoplastic immunotherapy: Secondary | ICD-10-CM

## 2019-02-08 DIAGNOSIS — E785 Hyperlipidemia, unspecified: Secondary | ICD-10-CM | POA: Insufficient documentation

## 2019-02-08 DIAGNOSIS — E039 Hypothyroidism, unspecified: Secondary | ICD-10-CM | POA: Insufficient documentation

## 2019-02-08 DIAGNOSIS — R0609 Other forms of dyspnea: Secondary | ICD-10-CM | POA: Insufficient documentation

## 2019-02-08 DIAGNOSIS — I10 Essential (primary) hypertension: Secondary | ICD-10-CM | POA: Diagnosis not present

## 2019-02-08 DIAGNOSIS — R05 Cough: Secondary | ICD-10-CM | POA: Insufficient documentation

## 2019-02-08 DIAGNOSIS — Z85820 Personal history of malignant melanoma of skin: Secondary | ICD-10-CM | POA: Diagnosis not present

## 2019-02-08 DIAGNOSIS — C3431 Malignant neoplasm of lower lobe, right bronchus or lung: Secondary | ICD-10-CM | POA: Diagnosis not present

## 2019-02-08 DIAGNOSIS — Z7901 Long term (current) use of anticoagulants: Secondary | ICD-10-CM | POA: Insufficient documentation

## 2019-02-08 DIAGNOSIS — Z9221 Personal history of antineoplastic chemotherapy: Secondary | ICD-10-CM | POA: Insufficient documentation

## 2019-02-08 DIAGNOSIS — Z95828 Presence of other vascular implants and grafts: Secondary | ICD-10-CM

## 2019-02-08 DIAGNOSIS — Z923 Personal history of irradiation: Secondary | ICD-10-CM | POA: Diagnosis not present

## 2019-02-08 DIAGNOSIS — C3491 Malignant neoplasm of unspecified part of right bronchus or lung: Secondary | ICD-10-CM

## 2019-02-08 DIAGNOSIS — Z79899 Other long term (current) drug therapy: Secondary | ICD-10-CM | POA: Insufficient documentation

## 2019-02-08 LAB — CBC WITH DIFFERENTIAL (CANCER CENTER ONLY)
Abs Immature Granulocytes: 0.01 10*3/uL (ref 0.00–0.07)
Basophils Absolute: 0 10*3/uL (ref 0.0–0.1)
Basophils Relative: 1 %
Eosinophils Absolute: 0.1 10*3/uL (ref 0.0–0.5)
Eosinophils Relative: 1 %
HCT: 36 % (ref 36.0–46.0)
Hemoglobin: 11.4 g/dL — ABNORMAL LOW (ref 12.0–15.0)
Immature Granulocytes: 0 %
Lymphocytes Relative: 17 %
Lymphs Abs: 0.8 10*3/uL (ref 0.7–4.0)
MCH: 29.2 pg (ref 26.0–34.0)
MCHC: 31.7 g/dL (ref 30.0–36.0)
MCV: 92.1 fL (ref 80.0–100.0)
Monocytes Absolute: 0.4 10*3/uL (ref 0.1–1.0)
Monocytes Relative: 8 %
Neutro Abs: 3.7 10*3/uL (ref 1.7–7.7)
Neutrophils Relative %: 73 %
Platelet Count: 131 10*3/uL — ABNORMAL LOW (ref 150–400)
RBC: 3.91 MIL/uL (ref 3.87–5.11)
RDW: 13.7 % (ref 11.5–15.5)
WBC Count: 5 10*3/uL (ref 4.0–10.5)
nRBC: 0 % (ref 0.0–0.2)

## 2019-02-08 LAB — CMP (CANCER CENTER ONLY)
ALT: 11 U/L (ref 0–44)
AST: 20 U/L (ref 15–41)
Albumin: 3.6 g/dL (ref 3.5–5.0)
Alkaline Phosphatase: 83 U/L (ref 38–126)
Anion gap: 8 (ref 5–15)
BUN: 31 mg/dL — ABNORMAL HIGH (ref 8–23)
CO2: 28 mmol/L (ref 22–32)
Calcium: 9.5 mg/dL (ref 8.9–10.3)
Chloride: 107 mmol/L (ref 98–111)
Creatinine: 1.75 mg/dL — ABNORMAL HIGH (ref 0.44–1.00)
GFR, Est AFR Am: 30 mL/min — ABNORMAL LOW (ref >60)
GFR, Estimated: 26 mL/min — ABNORMAL LOW (ref >60)
Glucose, Bld: 85 mg/dL (ref 70–99)
Potassium: 4.2 mmol/L (ref 3.5–5.1)
Sodium: 143 mmol/L (ref 135–145)
Total Bilirubin: 0.4 mg/dL (ref 0.3–1.2)
Total Protein: 7 g/dL (ref 6.5–8.1)

## 2019-02-08 LAB — TSH: TSH: 3.447 u[IU]/mL (ref 0.308–3.960)

## 2019-02-08 MED ORDER — SODIUM CHLORIDE 0.9 % IV SOLN
10.0000 mg/kg | Freq: Once | INTRAVENOUS | Status: AC
  Administered 2019-02-08: 14:00:00 740 mg via INTRAVENOUS
  Filled 2019-02-08: qty 10

## 2019-02-08 MED ORDER — HEPARIN SOD (PORK) LOCK FLUSH 100 UNIT/ML IV SOLN
500.0000 [IU] | Freq: Once | INTRAVENOUS | Status: AC | PRN
  Administered 2019-02-08: 15:00:00 500 [IU]
  Filled 2019-02-08: qty 5

## 2019-02-08 MED ORDER — SODIUM CHLORIDE 0.9% FLUSH
10.0000 mL | Freq: Once | INTRAVENOUS | Status: AC
  Administered 2019-02-08: 10 mL
  Filled 2019-02-08: qty 10

## 2019-02-08 MED ORDER — SODIUM CHLORIDE 0.9% FLUSH
10.0000 mL | INTRAVENOUS | Status: DC | PRN
  Administered 2019-02-08: 10 mL
  Filled 2019-02-08: qty 10

## 2019-02-08 MED ORDER — SODIUM CHLORIDE 0.9 % IV SOLN
Freq: Once | INTRAVENOUS | Status: AC
  Administered 2019-02-08: 14:00:00 via INTRAVENOUS
  Filled 2019-02-08: qty 250

## 2019-02-08 NOTE — Progress Notes (Signed)
[facility] [phone]   [phone]  OFFICE PROGRESS NOTE  [name], MD [place] [address]  DIAGNOSIS: Stage IIIB (T3, N3, M0) non-small cell lung cancer, squamous cell carcinoma presented with large right middle lobe lung mass in addition to right hilar, subcarinal and right supraclavicular lymphadenopathy diagnosed in August 2019.  PRIOR THERAPY: Concurrent chemoradiation with weekly carboplatin for AUC of 2 and paclitaxel 45 NG/M2.  Status post 6 cycles with partial response.   CURRENT THERAPY: Consolidation immunotherapy with Imfinzi 10 mg/KG every 2 weeks started February 28, 2018.  Status post 24 cycles.  INTERVAL HISTORY: Lisa [name] 85 y.o. female returns to the clinic today for follow-up visit.  The patient is feeling fine today with no concerning complaints except for mild shortness of breath with exertion.  She denied having any current chest pain, cough or hemoptysis.  She denied having any fever or chills.  She has no nausea, vomiting, diarrhea or constipation.  She has no headache or visual changes.  She continues to tolerate her treatment with immunotherapy fairly well.  She is here today for evaluation before starting cycle #25.   MEDICAL HISTORY: Past Medical History:  Diagnosis Date  . Diverticulosis   . Headache   . Hemorrhoids    internal and external  . Hyperlipidemia 01/29/1991  . Hypertension 10/29/1995  . Hypothyroidism 10/29/1995  . Lung mass    right middle lobe  . Melanoma ([place])    h/o, local excision. no chemo or rady.   . Skin cancer (melanoma) ([place])   . Tubular adenoma of colon   . Wears dentures     ALLERGIES:  is allergic to atorvastatin; celebrex [celecoxib]; cholecalciferol; and simvastatin.  MEDICATIONS:  Current Outpatient Medications  Medication Sig Dispense Refill  . amLODipine (NORVASC) 10 MG tablet Take 1 tablet by mouth once daily 90 tablet 2  .  Cholecalciferol (VITAMIN D) 2000 units tablet Take 2,000 Units by mouth 2 (two) times daily.    . Coenzyme Q10 100 MG capsule Take 100 mg by mouth daily.     Marland Kitchen docusate sodium (COLACE) 100 MG capsule Take 100 mg by mouth daily as needed for mild constipation.    . fluticasone (FLONASE) 50 MCG/ACT nasal spray USE 2 SPRAYS IN EACH NOSTRIL DAILY AS NEEDED FOR ALLERGIES 48 g 3  . levothyroxine (SYNTHROID, LEVOTHROID) 75 MCG tablet Take 1 tablet by mouth once daily 90 tablet 3  . lidocaine-prilocaine (EMLA) cream Apply 1 application topically as needed. 30 g 0  . loratadine (CLARITIN) 10 MG tablet Take 1 tablet (10 mg total) by mouth daily as needed for allergies or rhinitis. (Patient not taking: Reported on 08/11/2018)    . mirtazapine (REMERON) 7.5 MG tablet TAKE 1 TABLET BY MOUTH ONCE DAILY AT BEDTIME 30 tablet 0  . prochlorperazine (COMPAZINE) 10 MG tablet Take 1 tablet (10 mg total) by mouth every 6 (six) hours as needed for nausea or vomiting. (Patient not taking: Reported on 08/25/2018) 30 tablet 0  . sennosides-docusate sodium (SENOKOT-S) 8.6-50 MG tablet Take 1 tablet by mouth daily as needed for constipation.    [name] 20 MG TABS tablet TAKE 1 TABLET BY MOUTH ONCE DAILY WITH SUPPER 30 tablet 2   No current facility-administered medications for this visit.    Facility-Administered Medications Ordered in Other Visits  Medication Dose Route Frequency Provider Last Rate Last Dose  . acetaminophen (TYLENOL) tablet 650 mg  650 mg Oral Once Nicholas Lose, MD        SURGICAL HISTORY:  Past Surgical History:  Procedure Laterality Date  . BRONCHIAL BIOPSY  11/23/2017   Procedure: BRONCHIAL BIOPSIES;  Surgeon: Grace Isaac, MD;  Location: [facility] OR;  Service: Thoracic;;  . cataract surgery  2007, 2008   repair bilat  . DG BARIUM ENEMA ([place] HX) N/A 2016   [name]  . HERNIA REPAIR  04/25/2001  . IR IMAGING GUIDED PORT INSERTION  07/05/2018  . IR US GUIDE BX ASP/DRAIN  11/17/2017  . KNEE  SURGERY     meniscus tear per Dr. Ronnie Derby, R knee  . MULTIPLE TOOTH EXTRACTIONS    . SEPTOPLASTY  2006   Dr. Ernesto Rutherford  . SKIN CANCER EXCISION    . TONSILLECTOMY  1954  . VAGINAL HYSTERECTOMY    . VIDEO BRONCHOSCOPY WITH ENDOBRONCHIAL ULTRASOUND N/A 11/23/2017   Procedure: VIDEO BRONCHOSCOPY WITH ENDOBRONCHIAL ULTRASOUND;  Surgeon: Grace Isaac, MD;  Location: [facility] OR;  Service: Thoracic;  Laterality: N/A;    REVIEW OF SYSTEMS:  A comprehensive review of systems was negative except for: Respiratory: positive for dyspnea on exertion   PHYSICAL EXAMINATION: General appearance: alert, cooperative and no distress Head: Normocephalic, without obvious abnormality, atraumatic Neck: no adenopathy, no JVD, supple, symmetrical, trachea midline and thyroid not enlarged, symmetric, no tenderness/mass/nodules Lymph nodes: Cervical, supraclavicular, and axillary nodes normal. Resp: clear to auscultation bilaterally Back: symmetric, no curvature. ROM normal. No CVA tenderness. Cardio: regular rate and rhythm, S1, S2 normal, no murmur, click, rub or gallop GI: soft, non-tender; bowel sounds normal; no masses,  no organomegaly Extremities: extremities normal, atraumatic, no cyanosis or edema  ECOG PERFORMANCE STATUS: 1 - Symptomatic but completely ambulatory  Blood pressure 126/68, pulse 94, temperature 98.3 F (36.8 C), temperature source Temporal, resp. rate 17, height 5\' 8"  (1.727 m), weight 154 lb 4.8 oz (70 kg), SpO2 98 %.  LABORATORY DATA: Lab Results  Component Value Date   WBC 5.0 02/08/2019   HGB 11.4 (L) 02/08/2019   HCT 36.0 02/08/2019   MCV 92.1 02/08/2019   PLT 131 (L) 02/08/2019      Chemistry      Component Value Date/Time   NA 144 01/25/2019 1345   K 4.1 01/25/2019 1345   CL 109 01/25/2019 1345   CO2 24 01/25/2019 1345   BUN 30 (H) 01/25/2019 1345   CREATININE 1.65 (H) 01/25/2019 1345      Component Value Date/Time   CALCIUM 9.4 01/25/2019 1345   ALKPHOS 77  01/25/2019 1345   AST 18 01/25/2019 1345   ALT 9 01/25/2019 1345   BILITOT 0.4 01/25/2019 1345       RADIOGRAPHIC STUDIES: No results found.  ASSESSMENT AND PLAN: This is a very pleasant 85 years old white female with stage IIIb non-small cell lung cancer, squamous cell carcinoma.  She is currently undergoing a course of concurrent chemoradiation with weekly carboplatin and paclitaxel status post 6 cycles.  She tolerated this treatment well with partial response. She is currently undergoing consolidation treatment with immunotherapy with Imfinzi status post 24 cycles.   The patient has been doing well with no concerning complaints. I recommended for her to proceed with cycle #25 today as planned. I will see her back for follow-up visit in 2 weeks for evaluation before the last cycle of her consolidation treatment with Imfinzi. She was advised to call immediately if she has any concerning symptoms in the interval. The patient voices  understanding of current disease status and treatment options and is in agreement with the current care plan. All questions were answered. The patient knows to call the clinic with any problems, questions or concerns. We can certainly see the patient much sooner if necessary.  Disclaimer: This note was dictated with voice recognition software. Similar sounding words can inadvertently be transcribed and may not be corrected upon review.

## 2019-02-08 NOTE — Progress Notes (Signed)
Ok to treat with SCR 1.75 per Dr. Julien Nordmann.

## 2019-02-08 NOTE — Patient Instructions (Signed)
[facility] Discharge Instructions for Patients Receiving Chemotherapy  Today you received the following chemotherapy agents: Imfinzi.  To help prevent nausea and vomiting after your treatment, we encourage you to take your nausea medication as directed.   If you develop nausea and vomiting that is not controlled by your nausea medication, call the clinic.   BELOW ARE SYMPTOMS THAT SHOULD BE REPORTED IMMEDIATELY:  *FEVER GREATER THAN 100.5 F  *CHILLS WITH OR WITHOUT FEVER  NAUSEA AND VOMITING THAT IS NOT CONTROLLED WITH YOUR NAUSEA MEDICATION  *UNUSUAL SHORTNESS OF BREATH  *UNUSUAL BRUISING OR BLEEDING  TENDERNESS IN MOUTH AND THROAT WITH OR WITHOUT PRESENCE OF ULCERS  *URINARY PROBLEMS  *BOWEL PROBLEMS  UNUSUAL RASH Items with * indicate a potential emergency and should be followed up as soon as possible.  Feel free to call the clinic should you have any questions or concerns. The clinic phone number is [phone].  Please show the CHEMO ALERT CARD at check-in to the Emergency Department and triage nurse.   

## 2019-02-21 ENCOUNTER — Inpatient Hospital Stay: Payer: Medicare Other

## 2019-02-21 ENCOUNTER — Inpatient Hospital Stay: Payer: Medicare Other | Admitting: Physician Assistant

## 2019-02-21 ENCOUNTER — Other Ambulatory Visit: Payer: Self-pay

## 2019-02-21 VITALS — BP 135/86 | HR 89 | Temp 98.5°F | Resp 17 | Ht 68.0 in | Wt 154.4 lb

## 2019-02-21 DIAGNOSIS — R0609 Other forms of dyspnea: Secondary | ICD-10-CM | POA: Diagnosis not present

## 2019-02-21 DIAGNOSIS — Z5112 Encounter for antineoplastic immunotherapy: Secondary | ICD-10-CM | POA: Diagnosis not present

## 2019-02-21 DIAGNOSIS — Z9221 Personal history of antineoplastic chemotherapy: Secondary | ICD-10-CM | POA: Diagnosis not present

## 2019-02-21 DIAGNOSIS — Z923 Personal history of irradiation: Secondary | ICD-10-CM | POA: Diagnosis not present

## 2019-02-21 DIAGNOSIS — E039 Hypothyroidism, unspecified: Secondary | ICD-10-CM | POA: Diagnosis not present

## 2019-02-21 DIAGNOSIS — C3431 Malignant neoplasm of lower lobe, right bronchus or lung: Secondary | ICD-10-CM | POA: Diagnosis not present

## 2019-02-21 DIAGNOSIS — C3491 Malignant neoplasm of unspecified part of right bronchus or lung: Secondary | ICD-10-CM

## 2019-02-21 DIAGNOSIS — Z79899 Other long term (current) drug therapy: Secondary | ICD-10-CM | POA: Diagnosis not present

## 2019-02-21 DIAGNOSIS — E785 Hyperlipidemia, unspecified: Secondary | ICD-10-CM | POA: Diagnosis not present

## 2019-02-21 DIAGNOSIS — Z85820 Personal history of malignant melanoma of skin: Secondary | ICD-10-CM | POA: Diagnosis not present

## 2019-02-21 DIAGNOSIS — I10 Essential (primary) hypertension: Secondary | ICD-10-CM | POA: Diagnosis not present

## 2019-02-21 DIAGNOSIS — R05 Cough: Secondary | ICD-10-CM | POA: Diagnosis not present

## 2019-02-21 DIAGNOSIS — Z95828 Presence of other vascular implants and grafts: Secondary | ICD-10-CM

## 2019-02-21 DIAGNOSIS — Z7901 Long term (current) use of anticoagulants: Secondary | ICD-10-CM | POA: Diagnosis not present

## 2019-02-21 LAB — CMP (CANCER CENTER ONLY)
ALT: 11 U/L (ref 0–44)
AST: 20 U/L (ref 15–41)
Albumin: 3.5 g/dL (ref 3.5–5.0)
Alkaline Phosphatase: 86 U/L (ref 38–126)
Anion gap: 9 (ref 5–15)
BUN: 28 mg/dL — ABNORMAL HIGH (ref 8–23)
CO2: 26 mmol/L (ref 22–32)
Calcium: 9.3 mg/dL (ref 8.9–10.3)
Chloride: 108 mmol/L (ref 98–111)
Creatinine: 1.67 mg/dL — ABNORMAL HIGH (ref 0.44–1.00)
GFR, Est AFR Am: 32 mL/min — ABNORMAL LOW (ref >60)
GFR, Estimated: 28 mL/min — ABNORMAL LOW (ref >60)
Glucose, Bld: 100 mg/dL — ABNORMAL HIGH (ref 70–99)
Potassium: 3.8 mmol/L (ref 3.5–5.1)
Sodium: 143 mmol/L (ref 135–145)
Total Bilirubin: 0.5 mg/dL (ref 0.3–1.2)
Total Protein: 6.8 g/dL (ref 6.5–8.1)

## 2019-02-21 LAB — CBC WITH DIFFERENTIAL (CANCER CENTER ONLY)
Abs Immature Granulocytes: 0.01 10*3/uL (ref 0.00–0.07)
Basophils Absolute: 0 10*3/uL (ref 0.0–0.1)
Basophils Relative: 1 %
Eosinophils Absolute: 0.1 10*3/uL (ref 0.0–0.5)
Eosinophils Relative: 2 %
HCT: 35.9 % — ABNORMAL LOW (ref 36.0–46.0)
Hemoglobin: 11.4 g/dL — ABNORMAL LOW (ref 12.0–15.0)
Immature Granulocytes: 0 %
Lymphocytes Relative: 16 %
Lymphs Abs: 0.9 10*3/uL (ref 0.7–4.0)
MCH: 29.1 pg (ref 26.0–34.0)
MCHC: 31.8 g/dL (ref 30.0–36.0)
MCV: 91.6 fL (ref 80.0–100.0)
Monocytes Absolute: 0.5 10*3/uL (ref 0.1–1.0)
Monocytes Relative: 8 %
Neutro Abs: 4.3 10*3/uL (ref 1.7–7.7)
Neutrophils Relative %: 73 %
Platelet Count: 126 10*3/uL — ABNORMAL LOW (ref 150–400)
RBC: 3.92 MIL/uL (ref 3.87–5.11)
RDW: 13.5 % (ref 11.5–15.5)
WBC Count: 5.8 10*3/uL (ref 4.0–10.5)
nRBC: 0 % (ref 0.0–0.2)

## 2019-02-21 MED ORDER — SODIUM CHLORIDE 0.9% FLUSH
10.0000 mL | INTRAVENOUS | Status: DC | PRN
  Administered 2019-02-21: 10 mL
  Filled 2019-02-21: qty 10

## 2019-02-21 MED ORDER — SODIUM CHLORIDE 0.9 % IV SOLN
10.0000 mg/kg | Freq: Once | INTRAVENOUS | Status: AC
  Administered 2019-02-21: 740 mg via INTRAVENOUS
  Filled 2019-02-21: qty 10

## 2019-02-21 MED ORDER — SODIUM CHLORIDE 0.9 % IV SOLN
Freq: Once | INTRAVENOUS | Status: AC
  Administered 2019-02-21: 11:00:00 via INTRAVENOUS
  Filled 2019-02-21: qty 250

## 2019-02-21 MED ORDER — SODIUM CHLORIDE 0.9% FLUSH
10.0000 mL | Freq: Once | INTRAVENOUS | Status: AC
  Administered 2019-02-21: 10 mL
  Filled 2019-02-21: qty 10

## 2019-02-21 MED ORDER — HEPARIN SOD (PORK) LOCK FLUSH 100 UNIT/ML IV SOLN
500.0000 [IU] | Freq: Once | INTRAVENOUS | Status: AC | PRN
  Administered 2019-02-21: 500 [IU]
  Filled 2019-02-21: qty 5

## 2019-02-21 NOTE — Progress Notes (Signed)
[place] OFFICE PROGRESS NOTE  Lisa Ghent, MD [place] [address]  DIAGNOSIS: Stage IIIB (T3, N3, M0) non-small cell lung cancer, squamous cell carcinoma presented with large right middle lobe lung mass in addition to right hilar, subcarinal and right supraclavicular lymphadenopathy diagnosed in August 2019  PRIOR THERAPY: Concurrent chemoradiation with weekly carboplatin for AUC of 2 and paclitaxel 45 NG/M2. Status post 6 cycles with partial response  CURRENT THERAPY: Consolidation immunotherapy with Imfinzi 10 mg/KG every 2 weeks.Status post25cycles. Last dose 02/21/2019.  INTERVAL HISTORY: Lisa [name] 85 y.o. female returns to the clinic for a follow up visit. The patient is feeling well today without any concerning complaints except for a mild cough due to the "change in the weather". She recently started using OTC cough medication at night which has helped. She denies any fever, sore throat, chest pain, or shortness of breath. She denies any headaches or visual changes. She denies any rashes or skin changes. She denies any nausea, vomiting, diarrhea, or constipation. She denies any sore throat, facial pain/pressure, cough, or fevers. The patient continues to tolerate treatment with immunotherapy with Imfinzi well without any adverse effects. Denies any chills, night sweats, or weight loss. The patient is here today for evaluation prior to starting her final cycle of immunotherapy with cycle # 26   MEDICAL HISTORY: Past Medical History:  Diagnosis Date  . Diverticulosis   . Headache   . Hemorrhoids    internal and external  . Hyperlipidemia 01/29/1991  . Hypertension 10/29/1995  . Hypothyroidism 10/29/1995  . Lung mass    right middle lobe  . Melanoma ([place])    h/o, local excision. no chemo or rady.   . Skin cancer (melanoma) ([place])   . Tubular adenoma of colon   . Wears dentures     ALLERGIES:  is allergic to atorvastatin;  celebrex [celecoxib]; cholecalciferol; and simvastatin.  MEDICATIONS:  Current Outpatient Medications  Medication Sig Dispense Refill  . amLODipine (NORVASC) 10 MG tablet Take 1 tablet by mouth once daily 90 tablet 2  . Cholecalciferol (VITAMIN D) 2000 units tablet Take 2,000 Units by mouth 2 (two) times daily.    . Coenzyme Q10 100 MG capsule Take 100 mg by mouth daily.     Marland Kitchen docusate sodium (COLACE) 100 MG capsule Take 100 mg by mouth daily as needed for mild constipation.    . fluticasone (FLONASE) 50 MCG/ACT nasal spray USE 2 SPRAYS IN EACH NOSTRIL DAILY AS NEEDED FOR ALLERGIES 48 g 3  . levothyroxine (SYNTHROID, LEVOTHROID) 75 MCG tablet Take 1 tablet by mouth once daily 90 tablet 3  . lidocaine-prilocaine (EMLA) cream Apply 1 application topically as needed. 30 g 0  . loratadine (CLARITIN) 10 MG tablet Take 1 tablet (10 mg total) by mouth daily as needed for allergies or rhinitis.    . mirtazapine (REMERON) 7.5 MG tablet TAKE 1 TABLET BY MOUTH ONCE DAILY AT BEDTIME 30 tablet 0  . sennosides-docusate sodium (SENOKOT-S) 8.6-50 MG tablet Take 1 tablet by mouth daily as needed for constipation.    [name] 20 MG TABS tablet TAKE 1 TABLET BY MOUTH ONCE DAILY WITH SUPPER 30 tablet 2  . prochlorperazine (COMPAZINE) 10 MG tablet Take 1 tablet (10 mg total) by mouth every 6 (six) hours as needed for nausea or vomiting. (Patient not taking: Reported on 08/25/2018) 30 tablet 0   No current facility-administered medications for this visit.    Facility-Administered Medications Ordered  in Other Visits  Medication Dose Route Frequency Provider Last Rate Last Dose  . acetaminophen (TYLENOL) tablet 650 mg  650 mg Oral Once Nicholas Lose, MD        SURGICAL HISTORY:  Past Surgical History:  Procedure Laterality Date  . BRONCHIAL BIOPSY  11/23/2017   Procedure: BRONCHIAL BIOPSIES;  Surgeon: Grace Isaac, MD;  Location: [facility];  Service: Thoracic;;  . cataract surgery  2007, 2008   repair bilat   . DG BARIUM ENEMA ([place] HX) N/A 2016   [name]  . HERNIA REPAIR  04/25/2001  . IR IMAGING GUIDED PORT INSERTION  07/05/2018  . IR US GUIDE BX ASP/DRAIN  11/17/2017  . KNEE SURGERY     meniscus tear per Dr. Ronnie Derby, R knee  . MULTIPLE TOOTH EXTRACTIONS    . SEPTOPLASTY  2006   Dr. Ernesto Rutherford  . SKIN CANCER EXCISION    . TONSILLECTOMY  1954  . VAGINAL HYSTERECTOMY    . VIDEO BRONCHOSCOPY WITH ENDOBRONCHIAL ULTRASOUND N/A 11/23/2017   Procedure: VIDEO BRONCHOSCOPY WITH ENDOBRONCHIAL ULTRASOUND;  Surgeon: Grace Isaac, MD;  Location: [place];  Service: Thoracic;  Laterality: N/A;    REVIEW OF SYSTEMS:   Review of Systems  Constitutional: Negative for appetite change, chills, fatigue, fever and unexpected weight change.  HENT: Negative for mouth sores, nosebleeds, sore throat and trouble swallowing.   Eyes: Negative for eye problems and icterus.  Respiratory: Positive for mild cough. Negative for hemoptysis, shortness of breath and wheezing.   Cardiovascular: Negative for chest pain and leg swelling.  Gastrointestinal: Negative for abdominal pain, constipation, diarrhea, nausea and vomiting.  Genitourinary: Negative for bladder incontinence, difficulty urinating, dysuria, frequency and hematuria.   Musculoskeletal: Negative for back pain, gait problem, neck pain and neck stiffness.  Skin: Negative for itching and rash.  Neurological: Negative for dizziness, extremity weakness, gait problem, headaches, light-headedness and seizures.  Hematological: Negative for adenopathy. Does not bruise/bleed easily.  Psychiatric/Behavioral: Negative for confusion, depression and sleep disturbance. The patient is not nervous/anxious.     PHYSICAL EXAMINATION:  Blood pressure 135/86, pulse 89, temperature 98.5 F (36.9 C), temperature source Temporal, resp. rate 17, height 5\' 8"  (1.727 m), weight 154 lb 6.4 oz (70 kg), SpO2 98 %.  ECOG PERFORMANCE STATUS: 1 - Symptomatic but completely  ambulatory  Physical Exam  Constitutional: Oriented to person, place, and time and well-developed, well-nourished, and in no distress.   HENT:  Head: Normocephalic and atraumatic.  Mouth/Throat: Oropharynx is clear and moist. No oropharyngeal exudate.  Eyes: Conjunctivae are normal. Right eye exhibits no discharge. Left eye exhibits no discharge. No scleral icterus.  Neck: Normal range of motion. Neck supple.  Cardiovascular: Normal rate, regular rhythm, normal heart sounds and intact distal pulses.   Pulmonary/Chest: Effort normal. Wheezing in all lung fields. No respiratory distress. No rales.  Abdominal: Soft. Bowel sounds are normal. Exhibits no distension and no mass. There is no tenderness.  Musculoskeletal: Normal range of motion. Exhibits no edema.  Lymphadenopathy:    No cervical adenopathy.  Neurological: Alert and oriented to person, place, and time. Exhibits normal muscle tone. Gait normal. Coordination normal.  Skin: Skin is warm and dry. No rash noted. Not diaphoretic. No erythema. No pallor.  Psychiatric: Mood, memory and judgment normal.  Vitals reviewed.  LABORATORY DATA: Lab Results  Component Value Date   WBC 5.8 02/21/2019   HGB 11.4 (L) 02/21/2019   HCT 35.9 (L) 02/21/2019   MCV 91.6 02/21/2019   PLT  126 (L) 02/21/2019      Chemistry      Component Value Date/Time   NA 143 02/21/2019 0902   K 3.8 02/21/2019 0902   CL 108 02/21/2019 0902   CO2 26 02/21/2019 0902   BUN 28 (H) 02/21/2019 0902   CREATININE 1.67 (H) 02/21/2019 0902      Component Value Date/Time   CALCIUM 9.3 02/21/2019 0902   ALKPHOS 86 02/21/2019 0902   AST 20 02/21/2019 0902   ALT 11 02/21/2019 0902   BILITOT 0.5 02/21/2019 0902       RADIOGRAPHIC STUDIES:  No results found.   ASSESSMENT/PLAN:  This is a very pleasant 85 year old Caucasian female with stage IIIb non-small cell lung cancer, squamous cell carcinoma. She presented with a large right lower lobe lung mass in  addition to right hilar, subcarinal, and left supraclavicular lymphadenopathy. She was diagnosed in August 2019. She completed a course of concurrent chemoradiation with carboplatin and paclitaxel. She is status post 6 cycles withapartial response.  She is currently undergoing consolidation immunotherapy with Imfinzi 10 mg/kg IV every 2 weeks. She is status post25cycles. She is tolerating treatment well without any adverse side effects.   Labs were reviewed. Recommend that she proceed with her final cycle of immunotherapy today with cycle #26.   I will arrange for her to have a restaging CT scan of the chest in 2-3 weeks.   We will see her back after her restaging CT scan for evaluation and to discuss her scan results.   The patient was advised to call immediately if she has any concerning symptoms in the interval. The patient voices understanding of current disease status and treatment options and is in agreement with the current care plan. All questions were answered. The patient knows to call the clinic with any problems, questions or concerns. We can certainly see the patient much sooner if necessary   Orders Placed This Encounter  Procedures  . CT Chest Wo Contrast    Standing Status:   Future    Standing Expiration Date:   02/21/2020    Order Specific Question:   ** REASON FOR EXAM (FREE TEXT)    Answer:   Restaging Lung Cancer    Order Specific Question:   Preferred imaging location?    Answer:   [facility]    Order Specific Question:   Radiology Contrast Protocol - do NOT remove file path    Answer:   [email or web address]  . CBC with Differential ([place] Only)    Standing Status:   Future    Standing Expiration Date:   02/21/2020  . CMP ([place] only)    Standing Status:   Future    Standing Expiration Date:   02/21/2020     Juddson Cobern L Nikolette Reindl, PA-C 02/21/19

## 2019-02-21 NOTE — Progress Notes (Signed)
Per cassie pt is OK to treat with today's labs

## 2019-02-21 NOTE — Patient Instructions (Signed)
[facility] Discharge Instructions for Patients Receiving Chemotherapy  Today you received the following chemotherapy agents: Imfinzi.  To help prevent nausea and vomiting after your treatment, we encourage you to take your nausea medication as directed.   If you develop nausea and vomiting that is not controlled by your nausea medication, call the clinic.   BELOW ARE SYMPTOMS THAT SHOULD BE REPORTED IMMEDIATELY:  *FEVER GREATER THAN 100.5 F  *CHILLS WITH OR WITHOUT FEVER  NAUSEA AND VOMITING THAT IS NOT CONTROLLED WITH YOUR NAUSEA MEDICATION  *UNUSUAL SHORTNESS OF BREATH  *UNUSUAL BRUISING OR BLEEDING  TENDERNESS IN MOUTH AND THROAT WITH OR WITHOUT PRESENCE OF ULCERS  *URINARY PROBLEMS  *BOWEL PROBLEMS  UNUSUAL RASH Items with * indicate a potential emergency and should be followed up as soon as possible.  Feel free to call the clinic should you have any questions or concerns. The clinic phone number is [phone].  Please show the CHEMO ALERT CARD at check-in to the Emergency Department and triage nurse.   

## 2019-02-22 ENCOUNTER — Telehealth: Payer: Self-pay | Admitting: Internal Medicine

## 2019-02-22 NOTE — Telephone Encounter (Signed)
Scheduled per los. Called and spoke with husband. Confirmed appt

## 2019-02-27 ENCOUNTER — Other Ambulatory Visit: Payer: Self-pay | Admitting: Oncology

## 2019-03-07 ENCOUNTER — Ambulatory Visit (HOSPITAL_COMMUNITY)
Admission: RE | Admit: 2019-03-07 | Discharge: 2019-03-07 | Disposition: A | Discharge status: Home / Self Care | Payer: Medicare Other | Source: Ambulatory Visit | Attending: Physician Assistant | Admitting: Physician Assistant

## 2019-03-07 ENCOUNTER — Other Ambulatory Visit: Payer: Self-pay

## 2019-03-07 ENCOUNTER — Inpatient Hospital Stay: Payer: Medicare Other | Attending: Internal Medicine

## 2019-03-07 DIAGNOSIS — Z79899 Other long term (current) drug therapy: Secondary | ICD-10-CM | POA: Diagnosis not present

## 2019-03-07 DIAGNOSIS — C3431 Malignant neoplasm of lower lobe, right bronchus or lung: Secondary | ICD-10-CM | POA: Insufficient documentation

## 2019-03-07 DIAGNOSIS — E785 Hyperlipidemia, unspecified: Secondary | ICD-10-CM | POA: Diagnosis not present

## 2019-03-07 DIAGNOSIS — Z9221 Personal history of antineoplastic chemotherapy: Secondary | ICD-10-CM | POA: Diagnosis not present

## 2019-03-07 DIAGNOSIS — E039 Hypothyroidism, unspecified: Secondary | ICD-10-CM | POA: Diagnosis not present

## 2019-03-07 DIAGNOSIS — I712 Thoracic aortic aneurysm, without rupture: Secondary | ICD-10-CM | POA: Diagnosis not present

## 2019-03-07 DIAGNOSIS — Z7901 Long term (current) use of anticoagulants: Secondary | ICD-10-CM | POA: Insufficient documentation

## 2019-03-07 DIAGNOSIS — Z85820 Personal history of malignant melanoma of skin: Secondary | ICD-10-CM | POA: Diagnosis not present

## 2019-03-07 DIAGNOSIS — R5383 Other fatigue: Secondary | ICD-10-CM | POA: Insufficient documentation

## 2019-03-07 DIAGNOSIS — Z85118 Personal history of other malignant neoplasm of bronchus and lung: Secondary | ICD-10-CM | POA: Diagnosis not present

## 2019-03-07 DIAGNOSIS — Z86010 Personal history of colonic polyps: Secondary | ICD-10-CM | POA: Insufficient documentation

## 2019-03-07 DIAGNOSIS — C3491 Malignant neoplasm of unspecified part of right bronchus or lung: Secondary | ICD-10-CM

## 2019-03-07 DIAGNOSIS — Z923 Personal history of irradiation: Secondary | ICD-10-CM | POA: Diagnosis not present

## 2019-03-07 DIAGNOSIS — I10 Essential (primary) hypertension: Secondary | ICD-10-CM | POA: Diagnosis not present

## 2019-03-07 DIAGNOSIS — R0609 Other forms of dyspnea: Secondary | ICD-10-CM | POA: Diagnosis not present

## 2019-03-07 DIAGNOSIS — R05 Cough: Secondary | ICD-10-CM | POA: Insufficient documentation

## 2019-03-07 LAB — CBC WITH DIFFERENTIAL (CANCER CENTER ONLY)
Abs Immature Granulocytes: 0.01 10*3/uL (ref 0.00–0.07)
Basophils Absolute: 0 10*3/uL (ref 0.0–0.1)
Basophils Relative: 1 %
Eosinophils Absolute: 0.1 10*3/uL (ref 0.0–0.5)
Eosinophils Relative: 1 %
HCT: 37.4 % (ref 36.0–46.0)
Hemoglobin: 11.7 g/dL — ABNORMAL LOW (ref 12.0–15.0)
Immature Granulocytes: 0 %
Lymphocytes Relative: 17 %
Lymphs Abs: 1 10*3/uL (ref 0.7–4.0)
MCH: 29.1 pg (ref 26.0–34.0)
MCHC: 31.3 g/dL (ref 30.0–36.0)
MCV: 93 fL (ref 80.0–100.0)
Monocytes Absolute: 0.4 10*3/uL (ref 0.1–1.0)
Monocytes Relative: 7 %
Neutro Abs: 4.4 10*3/uL (ref 1.7–7.7)
Neutrophils Relative %: 74 %
Platelet Count: 171 10*3/uL (ref 150–400)
RBC: 4.02 MIL/uL (ref 3.87–5.11)
RDW: 13.4 % (ref 11.5–15.5)
WBC Count: 5.9 10*3/uL (ref 4.0–10.5)
nRBC: 0 % (ref 0.0–0.2)

## 2019-03-07 LAB — CMP (CANCER CENTER ONLY)
ALT: 10 U/L (ref 0–44)
AST: 19 U/L (ref 15–41)
Albumin: 3.6 g/dL (ref 3.5–5.0)
Alkaline Phosphatase: 70 U/L (ref 38–126)
Anion gap: 9 (ref 5–15)
BUN: 26 mg/dL — ABNORMAL HIGH (ref 8–23)
CO2: 28 mmol/L (ref 22–32)
Calcium: 9.4 mg/dL (ref 8.9–10.3)
Chloride: 106 mmol/L (ref 98–111)
Creatinine: 1.76 mg/dL — ABNORMAL HIGH (ref 0.44–1.00)
GFR, Est AFR Am: 30 mL/min — ABNORMAL LOW (ref >60)
GFR, Estimated: 26 mL/min — ABNORMAL LOW (ref >60)
Glucose, Bld: 100 mg/dL — ABNORMAL HIGH (ref 70–99)
Potassium: 4 mmol/L (ref 3.5–5.1)
Sodium: 143 mmol/L (ref 135–145)
Total Bilirubin: 0.4 mg/dL (ref 0.3–1.2)
Total Protein: 6.9 g/dL (ref 6.5–8.1)

## 2019-03-13 ENCOUNTER — Other Ambulatory Visit: Payer: Self-pay

## 2019-03-13 ENCOUNTER — Encounter: Payer: Self-pay | Admitting: Internal Medicine

## 2019-03-13 ENCOUNTER — Inpatient Hospital Stay (HOSPITAL_BASED_OUTPATIENT_CLINIC_OR_DEPARTMENT_OTHER): Payer: Medicare Other | Admitting: Internal Medicine

## 2019-03-13 VITALS — BP 134/90 | HR 96 | Temp 98.0°F | Resp 18 | Ht 68.0 in | Wt 152.1 lb

## 2019-03-13 DIAGNOSIS — C3490 Malignant neoplasm of unspecified part of unspecified bronchus or lung: Secondary | ICD-10-CM

## 2019-03-13 DIAGNOSIS — I10 Essential (primary) hypertension: Secondary | ICD-10-CM

## 2019-03-13 DIAGNOSIS — E785 Hyperlipidemia, unspecified: Secondary | ICD-10-CM | POA: Diagnosis not present

## 2019-03-13 DIAGNOSIS — Z85820 Personal history of malignant melanoma of skin: Secondary | ICD-10-CM | POA: Diagnosis not present

## 2019-03-13 DIAGNOSIS — Z7901 Long term (current) use of anticoagulants: Secondary | ICD-10-CM | POA: Diagnosis not present

## 2019-03-13 DIAGNOSIS — C3491 Malignant neoplasm of unspecified part of right bronchus or lung: Secondary | ICD-10-CM

## 2019-03-13 DIAGNOSIS — Z86010 Personal history of colonic polyps: Secondary | ICD-10-CM | POA: Diagnosis not present

## 2019-03-13 DIAGNOSIS — Z79899 Other long term (current) drug therapy: Secondary | ICD-10-CM | POA: Diagnosis not present

## 2019-03-13 DIAGNOSIS — Z9221 Personal history of antineoplastic chemotherapy: Secondary | ICD-10-CM | POA: Diagnosis not present

## 2019-03-13 DIAGNOSIS — E039 Hypothyroidism, unspecified: Secondary | ICD-10-CM | POA: Diagnosis not present

## 2019-03-13 DIAGNOSIS — R05 Cough: Secondary | ICD-10-CM | POA: Diagnosis not present

## 2019-03-13 DIAGNOSIS — Z923 Personal history of irradiation: Secondary | ICD-10-CM | POA: Diagnosis not present

## 2019-03-13 DIAGNOSIS — R0609 Other forms of dyspnea: Secondary | ICD-10-CM | POA: Diagnosis not present

## 2019-03-13 DIAGNOSIS — C3431 Malignant neoplasm of lower lobe, right bronchus or lung: Secondary | ICD-10-CM | POA: Diagnosis not present

## 2019-03-13 DIAGNOSIS — R5383 Other fatigue: Secondary | ICD-10-CM | POA: Diagnosis not present

## 2019-03-13 NOTE — Progress Notes (Signed)
[place] [phone]   [phone]  OFFICE PROGRESS NOTE  [name], MD [place] [address]  DIAGNOSIS: Stage IIIB (T3, N3, M0) non-small cell lung cancer, squamous cell carcinoma presented with large right middle lobe lung mass in addition to right hilar, subcarinal and right supraclavicular lymphadenopathy diagnosed in August 2019.  PRIOR THERAPY:  1) Concurrent chemoradiation with weekly carboplatin for AUC of 2 and paclitaxel 45 NG/M2.  Status post 6 cycles with partial response. 2) Consolidation immunotherapy with Imfinzi 10 mg/KG every 2 weeks started February 28, 2018.  Status post 26 cycles.  Last cycle was given on February 21, 2019  CURRENT THERAPY: None.  INTERVAL HISTORY: Lisa [name] 85 y.o. female returns to the clinic today for follow-up visit.  The patient is feeling fine today with no concerning complaints except for mild shortness of breath and cough.  She denied having any chest pain or hemoptysis.  She denied having any nausea, vomiting, diarrhea or constipation.  She has no fever or chills.  She denied having any recent weight loss or night sweats.  She has no headache or visual changes.  She completed the course of consolidation immunotherapy and she is here today for evaluation with repeat CT scan of the chest.  MEDICAL HISTORY: Past Medical History:  Diagnosis Date   Diverticulosis    Headache    Hemorrhoids    internal and external   Hyperlipidemia 01/29/1991   Hypertension 10/29/1995   Hypothyroidism 10/29/1995   Lung mass    right middle lobe   Melanoma ([place])    h/o, local excision. no chemo or rady.    Skin cancer (melanoma) ([place])    Tubular adenoma of colon    Wears dentures     ALLERGIES:  is allergic to atorvastatin; celebrex [celecoxib]; cholecalciferol; and simvastatin.  MEDICATIONS:  Current Outpatient Medications  Medication Sig Dispense Refill    amLODipine (NORVASC) 10 MG tablet Take 1 tablet by mouth once daily 90 tablet 2   Cholecalciferol (VITAMIN D) 2000 units tablet Take 2,000 Units by mouth 2 (two) times daily.     Coenzyme Q10 100 MG capsule Take 100 mg by mouth daily.      docusate sodium (COLACE) 100 MG capsule Take 100 mg by mouth daily as needed for mild constipation.     fluticasone (FLONASE) 50 MCG/ACT nasal spray USE 2 SPRAYS IN EACH NOSTRIL DAILY AS NEEDED FOR ALLERGIES 48 g 3   levothyroxine (SYNTHROID, LEVOTHROID) 75 MCG tablet Take 1 tablet by mouth once daily 90 tablet 3   lidocaine-prilocaine (EMLA) cream Apply 1 application topically as needed. 30 g 0   loratadine (CLARITIN) 10 MG tablet Take 1 tablet (10 mg total) by mouth daily as needed for allergies or rhinitis.     mirtazapine (REMERON) 7.5 MG tablet TAKE 1 TABLET BY MOUTH ONCE DAILY AT BEDTIME 30 tablet 0   prochlorperazine (COMPAZINE) 10 MG tablet Take 1 tablet (10 mg total) by mouth every 6 (six) hours as needed for nausea or vomiting. (Patient not taking: Reported on 08/25/2018) 30 tablet 0   sennosides-docusate sodium (SENOKOT-S) 8.6-50 MG tablet Take 1 tablet by mouth daily as needed for constipation.     XARELTO 20 MG TABS tablet TAKE 1 TABLET BY MOUTH ONCE DAILY WITH SUPPER 30 tablet 2   No current facility-administered medications for this visit.   Facility-Administered Medications Ordered in Other Visits  Medication Dose Route  Frequency Provider Last Rate Last Admin   acetaminophen (TYLENOL) tablet 650 mg  650 mg Oral Once Nicholas Lose, MD        SURGICAL HISTORY:  Past Surgical History:  Procedure Laterality Date   BRONCHIAL BIOPSY  11/23/2017   Procedure: BRONCHIAL BIOPSIES;  Surgeon: Grace Isaac, MD;  Location: [facility] OR;  Service: Thoracic;;   cataract surgery  2007, 2008   repair bilat   DG BARIUM ENEMA ([place] HX) N/A 2016   [place]   HERNIA REPAIR  04/25/2001   IR IMAGING GUIDED PORT INSERTION  07/05/2018   IR US  GUIDE BX ASP/DRAIN  11/17/2017   KNEE SURGERY     meniscus tear per Dr. Ronnie Derby, R knee   MULTIPLE TOOTH EXTRACTIONS     SEPTOPLASTY  2006   Dr. Ernesto Rutherford   SKIN CANCER EXCISION     TONSILLECTOMY  1954   VAGINAL HYSTERECTOMY     VIDEO BRONCHOSCOPY WITH ENDOBRONCHIAL ULTRASOUND N/A 11/23/2017   Procedure: VIDEO BRONCHOSCOPY WITH ENDOBRONCHIAL ULTRASOUND;  Surgeon: Grace Isaac, MD;  Location: [place];  Service: Thoracic;  Laterality: N/A;    REVIEW OF SYSTEMS:  Constitutional: positive for fatigue Eyes: negative Ears, nose, mouth, throat, and face: negative Respiratory: positive for cough and dyspnea on exertion Cardiovascular: negative Gastrointestinal: negative Genitourinary:negative Integument/breast: negative Hematologic/lymphatic: negative Musculoskeletal:negative Neurological: negative Behavioral/Psych: negative Endocrine: negative Allergic/Immunologic: negative   PHYSICAL EXAMINATION: General appearance: alert, cooperative and no distress Head: Normocephalic, without obvious abnormality, atraumatic Neck: no adenopathy, no JVD, supple, symmetrical, trachea midline and thyroid not enlarged, symmetric, no tenderness/mass/nodules Lymph nodes: Cervical, supraclavicular, and axillary nodes normal. Resp: clear to auscultation bilaterally Back: symmetric, no curvature. ROM normal. No CVA tenderness. Cardio: regular rate and rhythm, S1, S2 normal, no murmur, click, rub or gallop GI: soft, non-tender; bowel sounds normal; no masses,  no organomegaly Extremities: extremities normal, atraumatic, no cyanosis or edema Neurologic: Alert and oriented X 3, normal strength and tone. Normal symmetric reflexes. Normal coordination and gait  ECOG PERFORMANCE STATUS: 1 - Symptomatic but completely ambulatory  Blood pressure 134/90, pulse 96, temperature 98 F (36.7 C), temperature source Oral, resp. rate 18, height 5\' 8"  (1.727 m), weight 152 lb 1.6 oz (69 kg), SpO2 99  %.  LABORATORY DATA: Lab Results  Component Value Date   WBC 5.9 03/07/2019   HGB 11.7 (L) 03/07/2019   HCT 37.4 03/07/2019   MCV 93.0 03/07/2019   PLT 171 03/07/2019      Chemistry      Component Value Date/Time   NA 143 03/07/2019 1325   K 4.0 03/07/2019 1325   CL 106 03/07/2019 1325   CO2 28 03/07/2019 1325   BUN 26 (H) 03/07/2019 1325   CREATININE 1.76 (H) 03/07/2019 1325      Component Value Date/Time   CALCIUM 9.4 03/07/2019 1325   ALKPHOS 70 03/07/2019 1325   AST 19 03/07/2019 1325   ALT 10 03/07/2019 1325   BILITOT 0.4 03/07/2019 1325       RADIOGRAPHIC STUDIES: CT Chest Wo Contrast  Result Date: 03/07/2019 CLINICAL DATA:  85 year old female with history of squamous cell carcinoma of the right lung. Follow-up study. EXAM: CT CHEST WITHOUT CONTRAST TECHNIQUE: Multidetector CT imaging of the chest was performed following the standard protocol without IV contrast. COMPARISON:  Chest CT 11/11/2018. FINDINGS: Cardiovascular: Heart size is normal. There is no significant pericardial fluid, thickening or pericardial calcification. There is aortic atherosclerosis, as well as atherosclerosis of the great vessels of  the mediastinum and the coronary arteries, including calcified atherosclerotic plaque in the right coronary artery. Aneurysmal dilatation of the ascending thoracic aorta (5.4 cm in diameter). Right internal jugular single-lumen porta cath with tip terminating at the superior cavoatrial junction. Mediastinum/Nodes: Soft tissue fullness in the right hilar region, indistinguishable from parahilar mass (discussed below). No other definite lymphadenopathy identified in the mediastinal or left hilar nodal stations. Esophagus is unremarkable in appearance. No axillary lymphadenopathy. Lungs/Pleura: Compared to the prior study there is an increasingly convex mass-like appearance of the parahilar region of the right lung, highly concerning for recurrent neoplasm. This currently  measures approximately 6.6 x 4.6 x 4.5 cm (axial image 74 of series 2 and coronal image 64 of series 6). Surrounding areas of architectural distortion compatible with chronic postradiation changes, similar to prior examinations. Small right pleural effusion lying dependently. Left lung is clear. No left pleural effusion Upper Abdomen: Aortic atherosclerosis. Multiple low-attenuation lesions in both kidneys, incompletely characterized on today's non-contrast CT examination, but similar to the prior study and statistically likely to represent cysts. Musculoskeletal: Chronic compression fracture of the inferior endplate of V67 with 20% loss of anterior vertebral body height. There are no aggressive appearing lytic or blastic lesions noted in the visualized portions of the skeleton. IMPRESSION: 1. Findings are highly concerning for recurrent neoplasm in the central aspect of the right lung, as detailed above. Further evaluation with nonemergent PET-CT is strongly recommended in the near future. 2. Aortic atherosclerosis, in addition to right coronary artery disease. 3. Aneurysmal dilatation of the ascending thoracic aorta (5.4 cm in diameter). Ascending thoracic aortic aneurysm. Recommend semi-annual imaging followup by CTA or MRA and referral to cardiothoracic surgery if not already obtained. This recommendation follows 2010 ACCF/AHA/AATS/ACR/ASA/SCA/SCAI/SIR/STS/SVM Guidelines for the Diagnosis and Management of Patients With Thoracic Aortic Disease. Circulation. 2010; 121: N470-J628. Aortic aneurysm NOS (ICD10-I71.9). Aortic Atherosclerosis (ICD10-I70.0). Aortic aneurysm NOS (ICD10-I71.9). Electronically Signed   By: Vinnie Langton M.D.   On: 03/07/2019 20:13    ASSESSMENT AND PLAN: This is a very pleasant 85 years old white female with stage IIIb non-small cell lung cancer, squamous cell carcinoma.  She is currently undergoing a course of concurrent chemoradiation with weekly carboplatin and paclitaxel status  post 6 cycles.  She tolerated this treatment well with partial response. The patient also completed the course of consolidation treatment with immunotherapy with Imfinzi status post 26 cycles.  She tolerated her previous treatment well. She had repeat CT scan of the chest performed recently.  I personally and independently reviewed the scan images and discussed the results with the patient today. Her scan showed progressive consolidation in the right lung suspicious for disease recurrence but also worsening post radiation effect could not be excluded. I recommended for the patient to have a PET scan performed in the next 1-2 weeks for further evaluation of this disease and to rule out disease recurrence. I will see her back for follow-up visit in 2 weeks for evaluation and discussion of her PET scan results and further recommendation regarding her condition. For the hypertension she was advised to take her blood pressure medication as prescribed and to monitor it closely at home. The patient was advised to call immediately if she has any concerning symptoms in the interval. The patient voices understanding of current disease status and treatment options and is in agreement with the current care plan. All questions were answered. The patient knows to call the clinic with any problems, questions or concerns. We can certainly see  the patient much sooner if necessary.  Disclaimer: This note was dictated with voice recognition software. Similar sounding words can inadvertently be transcribed and may not be corrected upon review.

## 2019-03-14 ENCOUNTER — Telehealth: Payer: Self-pay | Admitting: Medical Oncology

## 2019-03-14 ENCOUNTER — Telehealth: Payer: Self-pay | Admitting: Internal Medicine

## 2019-03-14 NOTE — Telephone Encounter (Signed)
Scheduled per los. Called and spoke with patient. Call was disconnectd. Mailed printout

## 2019-03-14 NOTE — Telephone Encounter (Signed)
appt confirmed for 03/27/2019.

## 2019-03-21 ENCOUNTER — Other Ambulatory Visit: Payer: Self-pay

## 2019-03-21 ENCOUNTER — Encounter (HOSPITAL_COMMUNITY)
Admission: RE | Admit: 2019-03-21 | Discharge: 2019-03-21 | Disposition: A | Discharge status: Home / Self Care | Payer: Medicare Other | Source: Ambulatory Visit | Attending: Internal Medicine | Admitting: Internal Medicine

## 2019-03-21 DIAGNOSIS — C3490 Malignant neoplasm of unspecified part of unspecified bronchus or lung: Secondary | ICD-10-CM | POA: Diagnosis not present

## 2019-03-21 DIAGNOSIS — R918 Other nonspecific abnormal finding of lung field: Secondary | ICD-10-CM | POA: Insufficient documentation

## 2019-03-21 DIAGNOSIS — Z79899 Other long term (current) drug therapy: Secondary | ICD-10-CM | POA: Insufficient documentation

## 2019-03-21 LAB — GLUCOSE, CAPILLARY: Glucose-Capillary: 90 mg/dL (ref 70–99)

## 2019-03-21 MED ORDER — FLUDEOXYGLUCOSE F - 18 (FDG) INJECTION
7.9000 | Freq: Once | INTRAVENOUS | Status: AC | PRN
  Administered 2019-03-21: 7.9 via INTRAVENOUS

## 2019-03-22 ENCOUNTER — Telehealth: Payer: Self-pay | Admitting: Medical Oncology

## 2019-03-22 NOTE — Telephone Encounter (Signed)
PET scan report called and Dr Julien Nordmann notified.

## 2019-03-27 ENCOUNTER — Inpatient Hospital Stay (HOSPITAL_BASED_OUTPATIENT_CLINIC_OR_DEPARTMENT_OTHER): Payer: Medicare Other | Admitting: Internal Medicine

## 2019-03-27 ENCOUNTER — Encounter: Payer: Self-pay | Admitting: Internal Medicine

## 2019-03-27 ENCOUNTER — Other Ambulatory Visit: Payer: Self-pay

## 2019-03-27 VITALS — BP 141/82 | HR 88 | Temp 98.7°F | Resp 18 | Ht 68.0 in | Wt 149.5 lb

## 2019-03-27 DIAGNOSIS — Z86010 Personal history of colonic polyps: Secondary | ICD-10-CM | POA: Diagnosis not present

## 2019-03-27 DIAGNOSIS — R5383 Other fatigue: Secondary | ICD-10-CM | POA: Diagnosis not present

## 2019-03-27 DIAGNOSIS — Z79899 Other long term (current) drug therapy: Secondary | ICD-10-CM | POA: Diagnosis not present

## 2019-03-27 DIAGNOSIS — Z5111 Encounter for antineoplastic chemotherapy: Secondary | ICD-10-CM

## 2019-03-27 DIAGNOSIS — R05 Cough: Secondary | ICD-10-CM | POA: Diagnosis not present

## 2019-03-27 DIAGNOSIS — E785 Hyperlipidemia, unspecified: Secondary | ICD-10-CM | POA: Diagnosis not present

## 2019-03-27 DIAGNOSIS — C3431 Malignant neoplasm of lower lobe, right bronchus or lung: Secondary | ICD-10-CM | POA: Diagnosis not present

## 2019-03-27 DIAGNOSIS — I10 Essential (primary) hypertension: Secondary | ICD-10-CM | POA: Diagnosis not present

## 2019-03-27 DIAGNOSIS — Z85820 Personal history of malignant melanoma of skin: Secondary | ICD-10-CM | POA: Diagnosis not present

## 2019-03-27 DIAGNOSIS — Z5112 Encounter for antineoplastic immunotherapy: Secondary | ICD-10-CM

## 2019-03-27 DIAGNOSIS — Z9221 Personal history of antineoplastic chemotherapy: Secondary | ICD-10-CM | POA: Diagnosis not present

## 2019-03-27 DIAGNOSIS — Z7189 Other specified counseling: Secondary | ICD-10-CM

## 2019-03-27 DIAGNOSIS — C3491 Malignant neoplasm of unspecified part of right bronchus or lung: Secondary | ICD-10-CM | POA: Diagnosis not present

## 2019-03-27 DIAGNOSIS — Z923 Personal history of irradiation: Secondary | ICD-10-CM | POA: Diagnosis not present

## 2019-03-27 DIAGNOSIS — Z7901 Long term (current) use of anticoagulants: Secondary | ICD-10-CM | POA: Diagnosis not present

## 2019-03-27 DIAGNOSIS — E039 Hypothyroidism, unspecified: Secondary | ICD-10-CM | POA: Diagnosis not present

## 2019-03-27 DIAGNOSIS — R0609 Other forms of dyspnea: Secondary | ICD-10-CM | POA: Diagnosis not present

## 2019-03-27 MED ORDER — PROCHLORPERAZINE MALEATE 10 MG PO TABS: 10.0000 mg | ORAL_TABLET | Freq: Four times a day (QID) | ORAL | 0 refills | Status: DC | PRN

## 2019-03-27 NOTE — Progress Notes (Signed)
DISCONTINUE ON PATHWAY REGIMEN - Non-Small Cell Lung     A cycle is every 14 days:     Durvalumab   **Always confirm dose/schedule in your pharmacy ordering system**  REASON: Disease Progression PRIOR TREATMENT: [id number]: Durvalumab 10 mg/kg q14 Days x up to 12 Months TREATMENT RESPONSE: Progressive Disease (PD)  START ON PATHWAY REGIMEN - Non-Small Cell Lung     A cycle is every 21 days:     Pembrolizumab      Paclitaxel      Carboplatin   **Always confirm dose/schedule in your pharmacy ordering system**  Patient Characteristics: Stage IV Metastatic, Squamous, PS = 0, 1, First Line, PD-L1 Expression Positive 1-49% (TPS) / Negative / Not Tested / Awaiting Test Results and Immunotherapy Candidate AJCC T Category: T3 Current Disease Status: Distant Metastases AJCC N Category: N3 AJCC M Category: M1a AJCC 8 Stage Grouping: IVA Histology: Squamous Cell Line of therapy: First Line ECOG Performance Status: 1 PD-L1 Expression Status: Did Not Order Test Immunotherapy Candidate Status: Candidate for Immunotherapy Intent of Therapy: Non-Curative / Palliative Intent, Discussed with Patient 

## 2019-03-27 NOTE — Progress Notes (Signed)
[facility] [phone]   [phone]  OFFICE PROGRESS NOTE  [name], MD [place] [address]  DIAGNOSIS: Recurrent non-small cell lung cancer initially diagnosed stage IIIB (T3, N3, M0) non-small cell lung cancer, squamous cell carcinoma presented with large right middle lobe lung mass in addition to right hilar, subcarinal and right supraclavicular lymphadenopathy diagnosed in August 2019.  The patient had evidence for disease progression in December 2020.  PRIOR THERAPY:  1) Concurrent chemoradiation with weekly carboplatin for AUC of 2 and paclitaxel 45 NG/M2.  Status post 6 cycles with partial response. 2) Consolidation immunotherapy with Imfinzi 10 mg/KG every 2 weeks started February 28, 2018.  Status post 26 cycles.  Last cycle was given on February 21, 2019  CURRENT THERAPY: Systemic chemotherapy with carboplatin for AUC of 5, paclitaxel 175 mg/M2 and Keytruda 200 mg IV every 3 weeks.  First dose April 03, 2018.  INTERVAL HISTORY: ROSELLE NORTON 85 y.o. female returns to the clinic today for follow-up visit accompanied by her granddaughter Donnetta Simpers.  The patient is feeling fine today with no concerning complaints except for shortness of breath with exertion and dry cough.  She denied having any chest pain or hemoptysis.  She denied having any recent weight loss or night sweats.  She has no nausea, vomiting, diarrhea or constipation.  She denied having any headache or visual changes.  She was found on previous CT scan of the chest to have evidence for disease progression.  I ordered a PET scan which was performed recently and the patient is here today for evaluation and discussion of her PET scan results and treatment options.  MEDICAL HISTORY: Past Medical History:  Diagnosis Date  . Diverticulosis   . Headache   . Hemorrhoids    internal and external  . Hyperlipidemia 01/29/1991  . Hypertension 10/29/1995  .  Hypothyroidism 10/29/1995  . Lung mass    right middle lobe  . Melanoma ([facility])    h/o, local excision. no chemo or rady.   . Skin cancer (melanoma) ([place])   . Tubular adenoma of colon   . Wears dentures     ALLERGIES:  is allergic to atorvastatin; celebrex [celecoxib]; cholecalciferol; and simvastatin.  MEDICATIONS:  Current Outpatient Medications  Medication Sig Dispense Refill  . amLODipine (NORVASC) 10 MG tablet Take 1 tablet by mouth once daily 90 tablet 2  . Cholecalciferol (VITAMIN D) 2000 units tablet Take 2,000 Units by mouth 2 (two) times daily.    . Coenzyme Q10 100 MG capsule Take 100 mg by mouth daily.     Marland Kitchen docusate sodium (COLACE) 100 MG capsule Take 100 mg by mouth daily as needed for mild constipation.    . fluticasone (FLONASE) 50 MCG/ACT nasal spray USE 2 SPRAYS IN EACH NOSTRIL DAILY AS NEEDED FOR ALLERGIES 48 g 3  . levothyroxine (SYNTHROID, LEVOTHROID) 75 MCG tablet Take 1 tablet by mouth once daily 90 tablet 3  . lidocaine-prilocaine (EMLA) cream Apply 1 application topically as needed. 30 g 0  . loratadine (CLARITIN) 10 MG tablet Take 1 tablet (10 mg total) by mouth daily as needed for allergies or rhinitis.    . mirtazapine (REMERON) 7.5 MG tablet TAKE 1 TABLET BY MOUTH ONCE DAILY AT BEDTIME 30 tablet 0  . prochlorperazine (COMPAZINE) 10 MG tablet Take 1 tablet (10 mg total) by mouth every 6 (six) hours as needed for nausea or vomiting. (Patient not taking: Reported  on 08/25/2018) 30 tablet 0  . sennosides-docusate sodium (SENOKOT-S) 8.6-50 MG tablet Take 1 tablet by mouth daily as needed for constipation.    [name] 20 MG TABS tablet TAKE 1 TABLET BY MOUTH ONCE DAILY WITH SUPPER 30 tablet 2   No current facility-administered medications for this visit.   Facility-Administered Medications Ordered in Other Visits  Medication Dose Route Frequency Provider Last Rate Last Admin  . acetaminophen (TYLENOL) tablet 650 mg  650 mg Oral Once Nicholas Lose, MD         SURGICAL HISTORY:  Past Surgical History:  Procedure Laterality Date  . BRONCHIAL BIOPSY  11/23/2017   Procedure: BRONCHIAL BIOPSIES;  Surgeon: Grace Isaac, MD;  Location: [facility] OR;  Service: Thoracic;;  . cataract surgery  2007, 2008   repair bilat  . DG BARIUM ENEMA ([place] HX) N/A 2016   [name]  . HERNIA REPAIR  04/25/2001  . IR IMAGING GUIDED PORT INSERTION  07/05/2018  . IR US GUIDE BX ASP/DRAIN  11/17/2017  . KNEE SURGERY     meniscus tear per Dr. Ronnie Derby, R knee  . MULTIPLE TOOTH EXTRACTIONS    . SEPTOPLASTY  2006   Dr. Ernesto Rutherford  . SKIN CANCER EXCISION    . TONSILLECTOMY  1954  . VAGINAL HYSTERECTOMY    . VIDEO BRONCHOSCOPY WITH ENDOBRONCHIAL ULTRASOUND N/A 11/23/2017   Procedure: VIDEO BRONCHOSCOPY WITH ENDOBRONCHIAL ULTRASOUND;  Surgeon: Grace Isaac, MD;  Location: [facility];  Service: Thoracic;  Laterality: N/A;    REVIEW OF SYSTEMS:  Constitutional: positive for fatigue Eyes: negative Ears, nose, mouth, throat, and face: negative Respiratory: positive for cough and dyspnea on exertion Cardiovascular: negative Gastrointestinal: negative Genitourinary:negative Integument/breast: negative Hematologic/lymphatic: negative Musculoskeletal:negative Neurological: negative Behavioral/Psych: negative Endocrine: negative Allergic/Immunologic: negative   PHYSICAL EXAMINATION: General appearance: alert, cooperative, fatigued and no distress Head: Normocephalic, without obvious abnormality, atraumatic Neck: no adenopathy, no JVD, supple, symmetrical, trachea midline and thyroid not enlarged, symmetric, no tenderness/mass/nodules Lymph nodes: Cervical, supraclavicular, and axillary nodes normal. Resp: clear to auscultation bilaterally Back: symmetric, no curvature. ROM normal. No CVA tenderness. Cardio: regular rate and rhythm, S1, S2 normal, no murmur, click, rub or gallop GI: soft, non-tender; bowel sounds normal; no masses,  no organomegaly Extremities:  extremities normal, atraumatic, no cyanosis or edema Neurologic: Alert and oriented X 3, normal strength and tone. Normal symmetric reflexes. Normal coordination and gait  ECOG PERFORMANCE STATUS: 1 - Symptomatic but completely ambulatory  Blood pressure (!) 141/82, pulse 88, temperature 98.7 F (37.1 C), temperature source Temporal, resp. rate 18, height 5\' 8"  (1.727 m), weight 149 lb 8 oz (67.8 kg), SpO2 94 %.  LABORATORY DATA: Lab Results  Component Value Date   WBC 5.9 03/07/2019   HGB 11.7 (L) 03/07/2019   HCT 37.4 03/07/2019   MCV 93.0 03/07/2019   PLT 171 03/07/2019      Chemistry      Component Value Date/Time   NA 143 03/07/2019 1325   K 4.0 03/07/2019 1325   CL 106 03/07/2019 1325   CO2 28 03/07/2019 1325   BUN 26 (H) 03/07/2019 1325   CREATININE 1.76 (H) 03/07/2019 1325      Component Value Date/Time   CALCIUM 9.4 03/07/2019 1325   ALKPHOS 70 03/07/2019 1325   AST 19 03/07/2019 1325   ALT 10 03/07/2019 1325   BILITOT 0.4 03/07/2019 1325       RADIOGRAPHIC STUDIES: CT Chest Wo Contrast  Result Date: 03/07/2019 CLINICAL DATA:  85 year old female  with history of squamous cell carcinoma of the right lung. Follow-up study. EXAM: CT CHEST WITHOUT CONTRAST TECHNIQUE: Multidetector CT imaging of the chest was performed following the standard protocol without IV contrast. COMPARISON:  Chest CT 11/11/2018. FINDINGS: Cardiovascular: Heart size is normal. There is no significant pericardial fluid, thickening or pericardial calcification. There is aortic atherosclerosis, as well as atherosclerosis of the great vessels of the mediastinum and the coronary arteries, including calcified atherosclerotic plaque in the right coronary artery. Aneurysmal dilatation of the ascending thoracic aorta (5.4 cm in diameter). Right internal jugular single-lumen porta cath with tip terminating at the superior cavoatrial junction. Mediastinum/Nodes: Soft tissue fullness in the right hilar region,  indistinguishable from parahilar mass (discussed below). No other definite lymphadenopathy identified in the mediastinal or left hilar nodal stations. Esophagus is unremarkable in appearance. No axillary lymphadenopathy. Lungs/Pleura: Compared to the prior study there is an increasingly convex mass-like appearance of the parahilar region of the right lung, highly concerning for recurrent neoplasm. This currently measures approximately 6.6 x 4.6 x 4.5 cm (axial image 74 of series 2 and coronal image 64 of series 6). Surrounding areas of architectural distortion compatible with chronic postradiation changes, similar to prior examinations. Small right pleural effusion lying dependently. Left lung is clear. No left pleural effusion Upper Abdomen: Aortic atherosclerosis. Multiple low-attenuation lesions in both kidneys, incompletely characterized on today's non-contrast CT examination, but similar to the prior study and statistically likely to represent cysts. Musculoskeletal: Chronic compression fracture of the inferior endplate of W10 with 93% loss of anterior vertebral body height. There are no aggressive appearing lytic or blastic lesions noted in the visualized portions of the skeleton. IMPRESSION: 1. Findings are highly concerning for recurrent neoplasm in the central aspect of the right lung, as detailed above. Further evaluation with nonemergent PET-CT is strongly recommended in the near future. 2. Aortic atherosclerosis, in addition to right coronary artery disease. 3. Aneurysmal dilatation of the ascending thoracic aorta (5.4 cm in diameter). Ascending thoracic aortic aneurysm. Recommend semi-annual imaging followup by CTA or MRA and referral to cardiothoracic surgery if not already obtained. This recommendation follows 2010 ACCF/AHA/AATS/ACR/ASA/SCA/SCAI/SIR/STS/SVM Guidelines for the Diagnosis and Management of Patients With Thoracic Aortic Disease. Circulation. 2010; 121: A355-D322. Aortic aneurysm NOS  (ICD10-I71.9). Aortic Atherosclerosis (ICD10-I70.0). Aortic aneurysm NOS (ICD10-I71.9). Electronically Signed   By: Vinnie Langton M.D.   On: 03/07/2019 20:13   NM PET Image Restag (PS) Skull Base To Thigh  Result Date: 03/21/2019 CLINICAL DATA:  Subsequent treatment strategy for lung carcinoma. History of squamous cell carcinoma RIGHT lung. Suspicious lesion identified on surveillance CT. EXAM: NUCLEAR MEDICINE PET SKULL BASE TO THIGH TECHNIQUE: 7.9 mCi F-18 FDG was injected intravenously. Full-ring PET imaging was performed from the skull base to thigh after the radiotracer. CT data was obtained and used for attenuation correction and anatomic localization. Fasting blood glucose: 90 mg/dl COMPARISON:  None. FINDINGS: Mediastinal blood pool activity: SUV max 2.7 Liver activity: SUV max one NECK: No hypermetabolic lymph nodes in the neck. Incidental CT findings: none CHEST: Rounded soft tissue in the RIGHT infrahilar lung measuring 5.7 x 4.2 cm (image 71/4) has intense peripheral metabolic activity with SUV max equal 16.7. There is a band of ground-glass density in the RIGHT upper lobe which is ovoid measuring 4.8 x 2.6 cm also with intense metabolic activity with SUV max equal 8.8 (image 65/4). There is hypermetabolic activity in the medial aspect of the RIGHT middle lobe also associated ground-glass density. In the periphery of the LEFT  lower lobe there is a ground-glass band with intense metabolic activity along the pleural surface measuring 3.5 cm with SUV max equal 8.5. Small hypermetabolic precarinal lymph node with SUV max equal 7.6 new comparison PET-CT scan Incidental CT findings: None ABDOMEN/PELVIS: No abnormal metabolic activity liver. Adrenal glands normal. 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 Large cyst extending from the lower pole LEFT kidney. Activity in the RIGHT lower quadrant abdominal wall is favored related to a hernia plug. Incidental CT findings: none SKELETON: No focal hypermetabolic activity  to suggest skeletal metastasis. Incidental CT findings: none IMPRESSION: 1. Rounded hypermetabolic soft tissue mass inferior to the RIGHT hilum most consistent with lung cancer recurrence. 2. Bands of ground-glass density within the RIGHT upper lobe, RIGHT middle lobe and LEFT lower lobe are favored inflammatory or potentially related to immunotherapy therapy. Malignancy is less favored 3. Hypermetabolic precarinal lymph node concerning for nodal metastasis. 4. No evidence of metastatic disease in the abdomen pelvis. These results will be called to the ordering clinician or representative by the Radiologist Assistant, and communication documented in the PACS or zVision Dashboard. Electronically Signed   By: Suzy Bouchard M.D.   On: 03/21/2019 17:37    ASSESSMENT AND PLAN: This is a very pleasant 85 years old white female with stage IIIb non-small cell lung cancer, squamous cell carcinoma.  She is currently undergoing a course of concurrent chemoradiation with weekly carboplatin and paclitaxel status post 6 cycles.  She tolerated this treatment well with partial response. The patient also completed the course of consolidation treatment with immunotherapy with Imfinzi status post 26 cycles.  She tolerated her previous treatment well. Her recent CT scan of the chest showed progressive consolidation in the right lung suspicious for disease recurrence but also worsening post radiation effect could not be excluded.  I ordered a PET scan and it showed rounded hypermetabolic soft tissue mass inferior to the right hilum consistent with lung cancer recurrence.  There was also hypermetabolic precarinal lymph node concerning for nodal metastasis but no evidence for metastatic disease in the abdomen or pelvis.  The patient also had groundglass density in the right upper lobe, right middle lobe as well as left lower lobe suspicious for inflammatory process and less likely malignancy. I personally and independently  reviewed the PET scan images and discussed the result and showed the images to the patient and her granddaughter today. I discussed with the patient her treatment options including palliative care and hospice referral versus consideration of palliative systemic chemotherapy with carboplatin for AUC of 5, paclitaxel 175 mg/M2 and Keytruda 200 mg IV every 3 weeks.  The patient is interested in proceeding with systemic chemotherapy.  I discussed with him the adverse effect of this treatment including but not limited to alopecia, myelosuppression, nausea and vomiting, peripheral neuropathy, liver or renal dysfunction as well as the adverse effect of immunotherapy. The patient is expected to start the first cycle of this treatment on April 03, 2018. She will come back for follow-up visit in 4 weeks for evaluation before starting cycle #2. She was advised to call immediately if she has any concerning symptoms in the interval. The patient voices understanding of current disease status and treatment options and is in agreement with the current care plan. All questions were answered. The patient knows to call the clinic with any problems, questions or concerns. We can certainly see the patient much sooner if necessary.  Disclaimer: This note was dictated with voice recognition software. Similar sounding words can inadvertently  be transcribed and may not be corrected upon review.

## 2019-03-28 ENCOUNTER — Telehealth: Payer: Self-pay | Admitting: Internal Medicine

## 2019-03-28 NOTE — Telephone Encounter (Signed)
Scheduled per los. Called and spoke with patient., confirmed appt

## 2019-04-06 ENCOUNTER — Inpatient Hospital Stay: Payer: Medicare Other | Attending: Internal Medicine | Admitting: Physician Assistant

## 2019-04-06 ENCOUNTER — Inpatient Hospital Stay: Payer: Medicare Other

## 2019-04-06 ENCOUNTER — Other Ambulatory Visit: Payer: Self-pay | Admitting: Physician Assistant

## 2019-04-06 ENCOUNTER — Other Ambulatory Visit: Payer: Self-pay

## 2019-04-06 VITALS — BP 125/74 | HR 88 | Temp 98.0°F | Resp 17 | Ht 68.0 in | Wt 148.4 lb

## 2019-04-06 DIAGNOSIS — Z85820 Personal history of malignant melanoma of skin: Secondary | ICD-10-CM | POA: Insufficient documentation

## 2019-04-06 DIAGNOSIS — R062 Wheezing: Secondary | ICD-10-CM | POA: Insufficient documentation

## 2019-04-06 DIAGNOSIS — R630 Anorexia: Secondary | ICD-10-CM | POA: Insufficient documentation

## 2019-04-06 DIAGNOSIS — Z79899 Other long term (current) drug therapy: Secondary | ICD-10-CM | POA: Insufficient documentation

## 2019-04-06 DIAGNOSIS — C3491 Malignant neoplasm of unspecified part of right bronchus or lung: Secondary | ICD-10-CM

## 2019-04-06 DIAGNOSIS — Z7901 Long term (current) use of anticoagulants: Secondary | ICD-10-CM | POA: Diagnosis not present

## 2019-04-06 DIAGNOSIS — Z5112 Encounter for antineoplastic immunotherapy: Secondary | ICD-10-CM | POA: Insufficient documentation

## 2019-04-06 DIAGNOSIS — C3431 Malignant neoplasm of lower lobe, right bronchus or lung: Secondary | ICD-10-CM | POA: Insufficient documentation

## 2019-04-06 DIAGNOSIS — R05 Cough: Secondary | ICD-10-CM | POA: Diagnosis not present

## 2019-04-06 DIAGNOSIS — Z5111 Encounter for antineoplastic chemotherapy: Secondary | ICD-10-CM | POA: Insufficient documentation

## 2019-04-06 DIAGNOSIS — Z95828 Presence of other vascular implants and grafts: Secondary | ICD-10-CM | POA: Diagnosis not present

## 2019-04-06 DIAGNOSIS — R5383 Other fatigue: Secondary | ICD-10-CM | POA: Insufficient documentation

## 2019-04-06 DIAGNOSIS — I825Z3 Chronic embolism and thrombosis of unspecified deep veins of distal lower extremity, bilateral: Secondary | ICD-10-CM

## 2019-04-06 DIAGNOSIS — Z7689 Persons encountering health services in other specified circumstances: Secondary | ICD-10-CM | POA: Insufficient documentation

## 2019-04-06 LAB — CBC WITH DIFFERENTIAL (CANCER CENTER ONLY)
Abs Immature Granulocytes: 0.01 10*3/uL (ref 0.00–0.07)
Basophils Absolute: 0 10*3/uL (ref 0.0–0.1)
Basophils Relative: 1 %
Eosinophils Absolute: 0.1 10*3/uL (ref 0.0–0.5)
Eosinophils Relative: 1 %
HCT: 34.9 % — ABNORMAL LOW (ref 36.0–46.0)
Hemoglobin: 11.1 g/dL — ABNORMAL LOW (ref 12.0–15.0)
Immature Granulocytes: 0 %
Lymphocytes Relative: 15 %
Lymphs Abs: 0.9 10*3/uL (ref 0.7–4.0)
MCH: 28.5 pg (ref 26.0–34.0)
MCHC: 31.8 g/dL (ref 30.0–36.0)
MCV: 89.7 fL (ref 80.0–100.0)
Monocytes Absolute: 0.4 10*3/uL (ref 0.1–1.0)
Monocytes Relative: 7 %
Neutro Abs: 4.6 10*3/uL (ref 1.7–7.7)
Neutrophils Relative %: 76 %
Platelet Count: 157 10*3/uL (ref 150–400)
RBC: 3.89 MIL/uL (ref 3.87–5.11)
RDW: 13.5 % (ref 11.5–15.5)
WBC Count: 6 10*3/uL (ref 4.0–10.5)
nRBC: 0 % (ref 0.0–0.2)

## 2019-04-06 LAB — CMP (CANCER CENTER ONLY)
ALT: 9 U/L (ref 0–44)
AST: 16 U/L (ref 15–41)
Albumin: 3.3 g/dL — ABNORMAL LOW (ref 3.5–5.0)
Alkaline Phosphatase: 74 U/L (ref 38–126)
Anion gap: 6 (ref 5–15)
BUN: 23 mg/dL (ref 8–23)
CO2: 28 mmol/L (ref 22–32)
Calcium: 9 mg/dL (ref 8.9–10.3)
Chloride: 108 mmol/L (ref 98–111)
Creatinine: 1.42 mg/dL — ABNORMAL HIGH (ref 0.44–1.00)
GFR, Est AFR Am: 39 mL/min — ABNORMAL LOW (ref >60)
GFR, Estimated: 34 mL/min — ABNORMAL LOW (ref >60)
Glucose, Bld: 107 mg/dL — ABNORMAL HIGH (ref 70–99)
Potassium: 4 mmol/L (ref 3.5–5.1)
Sodium: 142 mmol/L (ref 135–145)
Total Bilirubin: 0.4 mg/dL (ref 0.3–1.2)
Total Protein: 6.6 g/dL (ref 6.5–8.1)

## 2019-04-06 MED ORDER — MIRTAZAPINE 7.5 MG PO TABS: 7.5000 mg | ORAL_TABLET | Freq: Every day | ORAL | 1 refills | Status: DC

## 2019-04-06 MED ORDER — SODIUM CHLORIDE 0.9 % IV SOLN
175.0000 mg/m2 | Freq: Once | INTRAVENOUS | Status: AC
  Administered 2019-04-06: 318 mg via INTRAVENOUS
  Filled 2019-04-06: qty 53

## 2019-04-06 MED ORDER — PALONOSETRON HCL INJECTION 0.25 MG/5ML
INTRAVENOUS | Status: AC
  Filled 2019-04-06: qty 5

## 2019-04-06 MED ORDER — LIDOCAINE-PRILOCAINE 2.5-2.5 % EX CREA: 1.0000 "application " | TOPICAL_CREAM | CUTANEOUS | 0 refills | Status: DC | PRN

## 2019-04-06 MED ORDER — FAMOTIDINE IN NACL 20-0.9 MG/50ML-% IV SOLN
INTRAVENOUS | Status: AC
  Filled 2019-04-06: qty 50

## 2019-04-06 MED ORDER — DEXAMETHASONE SODIUM PHOSPHATE 10 MG/ML IJ SOLN
10.0000 mg | Freq: Once | INTRAMUSCULAR | Status: AC
  Administered 2019-04-06: 10 mg via INTRAVENOUS

## 2019-04-06 MED ORDER — DIPHENHYDRAMINE HCL 50 MG/ML IJ SOLN
INTRAMUSCULAR | Status: AC
  Filled 2019-04-06: qty 1

## 2019-04-06 MED ORDER — SODIUM CHLORIDE 0.9 % IV SOLN: 200.0000 mg/m2 | Freq: Once | INTRAVENOUS | Status: DC

## 2019-04-06 MED ORDER — DIPHENHYDRAMINE HCL 50 MG/ML IJ SOLN
50.0000 mg | Freq: Once | INTRAMUSCULAR | Status: AC
  Administered 2019-04-06: 50 mg via INTRAVENOUS

## 2019-04-06 MED ORDER — SODIUM CHLORIDE 0.9% FLUSH
10.0000 mL | Freq: Once | INTRAVENOUS | Status: AC
  Administered 2019-04-06: 10 mL
  Filled 2019-04-06: qty 10

## 2019-04-06 MED ORDER — SODIUM CHLORIDE 0.9 % IV SOLN
150.0000 mg | Freq: Once | INTRAVENOUS | Status: AC
  Administered 2019-04-06: 150 mg via INTRAVENOUS
  Filled 2019-04-06: qty 5

## 2019-04-06 MED ORDER — FAMOTIDINE IN NACL 20-0.9 MG/50ML-% IV SOLN
20.0000 mg | Freq: Once | INTRAVENOUS | Status: AC
  Administered 2019-04-06: 20 mg via INTRAVENOUS

## 2019-04-06 MED ORDER — PALONOSETRON HCL INJECTION 0.25 MG/5ML
0.2500 mg | Freq: Once | INTRAVENOUS | Status: AC
  Administered 2019-04-06: 0.25 mg via INTRAVENOUS

## 2019-04-06 MED ORDER — SODIUM CHLORIDE 0.9% FLUSH
10.0000 mL | INTRAVENOUS | Status: DC | PRN
  Administered 2019-04-06: 10 mL
  Filled 2019-04-06: qty 10

## 2019-04-06 MED ORDER — SODIUM CHLORIDE 0.9 % IV SOLN
252.5000 mg | Freq: Once | INTRAVENOUS | Status: AC
  Administered 2019-04-06: 250 mg via INTRAVENOUS
  Filled 2019-04-06: qty 25

## 2019-04-06 MED ORDER — SODIUM CHLORIDE 0.9 % IV SOLN
Freq: Once | INTRAVENOUS | Status: AC
  Filled 2019-04-06: qty 250

## 2019-04-06 MED ORDER — HEPARIN SOD (PORK) LOCK FLUSH 100 UNIT/ML IV SOLN
500.0000 [IU] | Freq: Once | INTRAVENOUS | Status: AC | PRN
  Administered 2019-04-06: 500 [IU]
  Filled 2019-04-06: qty 5

## 2019-04-06 MED ORDER — SODIUM CHLORIDE 0.9 % IV SOLN: 10.0000 mg | Freq: Once | INTRAVENOUS | Status: DC

## 2019-04-06 MED ORDER — ALBUTEROL SULFATE HFA 108 (90 BASE) MCG/ACT IN AERS: 2.0000 | INHALATION_SPRAY | Freq: Four times a day (QID) | RESPIRATORY_TRACT | 0 refills | Status: DC | PRN

## 2019-04-06 MED ORDER — DEXAMETHASONE SODIUM PHOSPHATE 10 MG/ML IJ SOLN
INTRAMUSCULAR | Status: AC
  Filled 2019-04-06: qty 1

## 2019-04-06 NOTE — Patient Instructions (Addendum)
[name] Discharge Instructions for Patients Receiving Chemotherapy  Today you received the following chemotherapy agents: Taxol/Carboplatin  To help prevent nausea and vomiting after your treatment, we encourage you to take your nausea medication as directed.   If you develop nausea and vomiting that is not controlled by your nausea medication, call the clinic.   BELOW ARE SYMPTOMS THAT SHOULD BE REPORTED IMMEDIATELY:  *FEVER GREATER THAN 100.5 F  *CHILLS WITH OR WITHOUT FEVER  NAUSEA AND VOMITING THAT IS NOT CONTROLLED WITH YOUR NAUSEA MEDICATION  *UNUSUAL SHORTNESS OF BREATH  *UNUSUAL BRUISING OR BLEEDING  TENDERNESS IN MOUTH AND THROAT WITH OR WITHOUT PRESENCE OF ULCERS  *URINARY PROBLEMS  *BOWEL PROBLEMS  UNUSUAL RASH Items with * indicate a potential emergency and should be followed up as soon as possible.  Feel free to call the clinic should you have any questions or concerns. The clinic phone number is [phone].  Please show the [place] at check-in to the Emergency Department and triage nurse.  Paclitaxel injection What is this medicine? PACLITAXEL (PAK li TAX el) is a chemotherapy drug. It targets fast dividing cells, like cancer cells, and causes these cells to die. This medicine is used to treat ovarian cancer, breast cancer, lung cancer, Kaposi's sarcoma, and other cancers. This medicine may be used for other purposes; ask your health care provider or pharmacist if you have questions. COMMON BRAND NAME(S): Onxol, Taxol What should I tell my health care provider before I take this medicine? They need to know if you have any of these conditions:  history of irregular heartbeat  liver disease  low blood counts, like low white cell, platelet, or red cell counts  lung or breathing disease, like asthma  tingling of the fingers or toes, or other nerve disorder  an unusual or allergic reaction to paclitaxel, alcohol,  polyoxyethylated castor oil, other chemotherapy, other medicines, foods, dyes, or preservatives  pregnant or trying to get pregnant  breast-feeding How should I use this medicine? This drug is given as an infusion into a vein. It is administered in a hospital or clinic by a specially trained health care professional. Talk to your pediatrician regarding the use of this medicine in children. Special care may be needed. Overdosage: If you think you have taken too much of this medicine contact a poison control center or emergency room at once. NOTE: This medicine is only for you. Do not share this medicine with others. What if I miss a dose? It is important not to miss your dose. Call your doctor or health care professional if you are unable to keep an appointment. What may interact with this medicine? Do not take this medicine with any of the following medications:  disulfiram  metronidazole This medicine may also interact with the following medications:  antiviral medicines for hepatitis, HIV or AIDS  certain antibiotics like erythromycin and clarithromycin  certain medicines for fungal infections like ketoconazole and itraconazole  certain medicines for seizures like carbamazepine, phenobarbital, phenytoin  gemfibrozil  nefazodone  rifampin  St. John's wort This list may not describe all possible interactions. Give your health care provider a list of all the medicines, herbs, non-prescription drugs, or dietary supplements you use. Also tell them if you smoke, drink alcohol, or use illegal drugs. Some items may interact with your medicine. What should I watch for while using this medicine? Your condition will be monitored carefully while you are receiving this medicine. You will need important blood work  done while you are taking this medicine. This medicine can cause serious allergic reactions. To reduce your risk you will need to take other medicine(s) before treatment with this  medicine. If you experience allergic reactions like skin rash, itching or hives, swelling of the face, lips, or tongue, tell your doctor or health care professional right away. In some cases, you may be given additional medicines to help with side effects. Follow all directions for their use. This drug may make you feel generally unwell. This is not uncommon, as chemotherapy can affect healthy cells as well as cancer cells. Report any side effects. Continue your course of treatment even though you feel ill unless your doctor tells you to stop. Call your doctor or health care professional for advice if you get a fever, chills or sore throat, or other symptoms of a cold or flu. Do not treat yourself. This drug decreases your body's ability to fight infections. Try to avoid being around people who are sick. This medicine may increase your risk to bruise or bleed. Call your doctor or health care professional if you notice any unusual bleeding. Be careful brushing and flossing your teeth or using a toothpick because you may get an infection or bleed more easily. If you have any dental work done, tell your dentist you are receiving this medicine. Avoid taking products that contain aspirin, acetaminophen, ibuprofen, naproxen, or ketoprofen unless instructed by your doctor. These medicines may hide a fever. Do not become pregnant while taking this medicine. Women should inform their doctor if they wish to become pregnant or think they might be pregnant. There is a potential for serious side effects to an unborn child. Talk to your health care professional or pharmacist for more information. Do not breast-feed an infant while taking this medicine. Men are advised not to father a child while receiving this medicine. This product may contain alcohol. Ask your pharmacist or healthcare provider if this medicine contains alcohol. Be sure to tell all healthcare providers you are taking this medicine. Certain medicines,  like metronidazole and disulfiram, can cause an unpleasant reaction when taken with alcohol. The reaction includes flushing, headache, nausea, vomiting, sweating, and increased thirst. The reaction can last from 30 minutes to several hours. What side effects may I notice from receiving this medicine? Side effects that you should report to your doctor or health care professional as soon as possible:  allergic reactions like skin rash, itching or hives, swelling of the face, lips, or tongue  breathing problems  changes in vision  fast, irregular heartbeat  high or low blood pressure  mouth sores  pain, tingling, numbness in the hands or feet  signs of decreased platelets or bleeding - bruising, pinpoint red spots on the skin, black, tarry stools, blood in the urine  signs of decreased red blood cells - unusually weak or tired, feeling faint or lightheaded, falls  signs of infection - fever or chills, cough, sore throat, pain or difficulty passing urine  signs and symptoms of liver injury like dark yellow or brown urine; general ill feeling or flu-like symptoms; light-colored stools; loss of appetite; nausea; right upper belly pain; unusually weak or tired; yellowing of the eyes or skin  swelling of the ankles, feet, hands  unusually slow heartbeat Side effects that usually do not require medical attention (report to your doctor or health care professional if they continue or are bothersome):  diarrhea  hair loss  loss of appetite  muscle or joint pain  nausea, vomiting  pain, redness, or irritation at site where injected  tiredness This list may not describe all possible side effects. Call your doctor for medical advice about side effects. You may report side effects to FDA at 1-800-FDA-1088. Where should I keep my medicine? This drug is given in a hospital or clinic and will not be stored at home. NOTE: This sheet is a summary. It may not cover all possible information.  If you have questions about this medicine, talk to your doctor, pharmacist, or health care provider.  2020 [employer]/Gold Standard (2016-11-17 13:14:55)  Carboplatin injection What is this medicine? CARBOPLATIN (KAR boe pla tin) is a chemotherapy drug. It targets fast dividing cells, like cancer cells, and causes these cells to die. This medicine is used to treat ovarian cancer and many other cancers. This medicine may be used for other purposes; ask your health care provider or pharmacist if you have questions. COMMON BRAND NAME(S): Paraplatin What should I tell my health care provider before I take this medicine? They need to know if you have any of these conditions:  blood disorders  hearing problems  kidney disease  recent or ongoing radiation therapy  an unusual or allergic reaction to carboplatin, cisplatin, other chemotherapy, other medicines, foods, dyes, or preservatives  pregnant or trying to get pregnant  breast-feeding How should I use this medicine? This drug is usually given as an infusion into a vein. It is administered in a hospital or clinic by a specially trained health care professional. Talk to your pediatrician regarding the use of this medicine in children. Special care may be needed. Overdosage: If you think you have taken too much of this medicine contact a poison control center or emergency room at once. NOTE: This medicine is only for you. Do not share this medicine with others. What if I miss a dose? It is important not to miss a dose. Call your doctor or health care professional if you are unable to keep an appointment. What may interact with this medicine?  medicines for seizures  medicines to increase blood counts like filgrastim, pegfilgrastim, sargramostim  some antibiotics like amikacin, gentamicin, neomycin, streptomycin, tobramycin  vaccines Talk to your doctor or health care professional before taking any of these  medicines:  acetaminophen  aspirin  ibuprofen  ketoprofen  naproxen This list may not describe all possible interactions. Give your health care provider a list of all the medicines, herbs, non-prescription drugs, or dietary supplements you use. Also tell them if you smoke, drink alcohol, or use illegal drugs. Some items may interact with your medicine. What should I watch for while using this medicine? Your condition will be monitored carefully while you are receiving this medicine. You will need important blood work done while you are taking this medicine. This drug may make you feel generally unwell. This is not uncommon, as chemotherapy can affect healthy cells as well as cancer cells. Report any side effects. Continue your course of treatment even though you feel ill unless your doctor tells you to stop. In some cases, you may be given additional medicines to help with side effects. Follow all directions for their use. Call your doctor or health care professional for advice if you get a fever, chills or sore throat, or other symptoms of a cold or flu. Do not treat yourself. This drug decreases your body's ability to fight infections. Try to avoid being around people who are sick. This medicine may increase your risk to bruise or bleed.  Call your doctor or health care professional if you notice any unusual bleeding. Be careful brushing and flossing your teeth or using a toothpick because you may get an infection or bleed more easily. If you have any dental work done, tell your dentist you are receiving this medicine. Avoid taking products that contain aspirin, acetaminophen, ibuprofen, naproxen, or ketoprofen unless instructed by your doctor. These medicines may hide a fever. Do not become pregnant while taking this medicine. Women should inform their doctor if they wish to become pregnant or think they might be pregnant. There is a potential for serious side effects to an unborn child. Talk  to your health care professional or pharmacist for more information. Do not breast-feed an infant while taking this medicine. What side effects may I notice from receiving this medicine? Side effects that you should report to your doctor or health care professional as soon as possible:  allergic reactions like skin rash, itching or hives, swelling of the face, lips, or tongue  signs of infection - fever or chills, cough, sore throat, pain or difficulty passing urine  signs of decreased platelets or bleeding - bruising, pinpoint red spots on the skin, black, tarry stools, nosebleeds  signs of decreased red blood cells - unusually weak or tired, fainting spells, lightheadedness  breathing problems  changes in hearing  changes in vision  chest pain  high blood pressure  low blood counts - This drug may decrease the number of white blood cells, red blood cells and platelets. You may be at increased risk for infections and bleeding.  nausea and vomiting  pain, swelling, redness or irritation at the injection site  pain, tingling, numbness in the hands or feet  problems with balance, talking, walking  trouble passing urine or change in the amount of urine Side effects that usually do not require medical attention (report to your doctor or health care professional if they continue or are bothersome):  hair loss  loss of appetite  metallic taste in the mouth or changes in taste This list may not describe all possible side effects. Call your doctor for medical advice about side effects. You may report side effects to FDA at 1-800-FDA-1088. Where should I keep my medicine? This drug is given in a hospital or clinic and will not be stored at home. NOTE: This sheet is a summary. It may not cover all possible information. If you have questions about this medicine, talk to your doctor, pharmacist, or health care provider.  2020 Elsevier/Gold Standard (2007-06-21 14:38:05)

## 2019-04-06 NOTE — Progress Notes (Signed)
Spoke w/ Cassie, only giving carboplatin AUC=5 and paclitaxel 175mg /m2 today for insurance reasons. Pembro not yet authorized.   Please follow up authorization status of pegfilgrastim.   Demetrius Charity, PharmD, [facility] Pharmacy Phone: [phone] 04/06/2019

## 2019-04-06 NOTE — Progress Notes (Signed)
[facility] OFFICE PROGRESS NOTE  [name], MD [place] [address]  DIAGNOSIS:  Recurrent non-small cell lung cancer initially diagnosed stage IIIB (T3, N3, M0) non-small cell lung cancer, squamous cell carcinoma presented with large right middle lobe lung mass in addition to right hilar, subcarinal and right supraclavicular lymphadenopathy diagnosed in August 2019.  The patient had evidence for disease progression in December 2020.  PRIOR THERAPY: 1) Concurrent chemoradiation with weekly carboplatin for AUC of 2 and paclitaxel 45 NG/M2.  Status post 6 cycles with partial response. 2) Consolidation immunotherapy with Imfinzi 10 mg/KG every 2 weeks started February 28, 2018.  Status post 26 cycles.  Last cycle was given on February 21, 2019  CURRENT THERAPY: Systemic chemotherapy with carboplatin for an AUC of 5, paclitaxel 175 mg/m and Keytruda 200 mg IV every 3 weeks with Neulasta support.  First dose on April 05, 2018.  INTERVAL HISTORY: Lisa [name] 86 y.o. female returns to the clinic for a follow-up visit accompanied by her granddaughter.  The patient's most recent restaging CT scan showed evidence of disease progression.  Therefore, the patient is scheduled to begin systemic chemotherapy with with carboplatin, Taxol, and Keytruda today.There are some challenges with insurance authorization of her immunotherapy with Keytruda as well as her Neulasta.  Today, the patient is feeling fairly well without any concerning complaints except for a decreased desire to eat as well as a persistent but stable dry cough and baseline shortness of breath with exertion.  She used to take Remeron in the past for her decreased appetite and tolerated this medication fairly well without any known adverse side effects. This medication was never refilled and the patient had stopped taking it.  She denies any recent fever, chills, or night sweats.  She denies any chest  pain or hemoptysis.  She denies any nausea, vomiting, diarrhea, or constipation.  She denies any headache or visual changes.  She is here today for evaluation before starting her first cycle of her new treatment.   MEDICAL HISTORY: Past Medical History:  Diagnosis Date  . Diverticulosis   . Headache   . Hemorrhoids    internal and external  . Hyperlipidemia 01/29/1991  . Hypertension 10/29/1995  . Hypothyroidism 10/29/1995  . Lung mass    right middle lobe  . Melanoma ([place])    h/o, local excision. no chemo or rady.   . Skin cancer (melanoma) ([place])   . Tubular adenoma of colon   . Wears dentures     ALLERGIES:  is allergic to atorvastatin; celebrex [celecoxib]; cholecalciferol; and simvastatin.  MEDICATIONS:  Current Outpatient Medications  Medication Sig Dispense Refill  . amLODipine (NORVASC) 10 MG tablet Take 1 tablet by mouth once daily 90 tablet 2  . Cholecalciferol (VITAMIN D) 2000 units tablet Take 2,000 Units by mouth 2 (two) times daily.    . Coenzyme Q10 100 MG capsule Take 100 mg by mouth daily.     Marland Kitchen docusate sodium (COLACE) 100 MG capsule Take 100 mg by mouth daily as needed for mild constipation.    . fluticasone (FLONASE) 50 MCG/ACT nasal spray USE 2 SPRAYS IN EACH NOSTRIL DAILY AS NEEDED FOR ALLERGIES 48 g 3  . levothyroxine (SYNTHROID, LEVOTHROID) 75 MCG tablet Take 1 tablet by mouth once daily 90 tablet 3  . loratadine (CLARITIN) 10 MG tablet Take 1 tablet (10 mg total) by mouth daily as needed for allergies or rhinitis.    Marland Kitchen sennosides-docusate  sodium (SENOKOT-S) 8.6-50 MG tablet Take 1 tablet by mouth daily as needed for constipation.    [name] 20 MG TABS tablet TAKE 1 TABLET BY MOUTH ONCE DAILY WITH SUPPER 30 tablet 2  . albuterol (VENTOLIN HFA) 108 (90 Base) MCG/ACT inhaler Inhale 2 puffs into the lungs every 6 (six) hours as needed for wheezing or shortness of breath. 6.7 g 0  . lidocaine-prilocaine (EMLA) cream Apply 1 application topically as needed. 30  g 0  . mirtazapine (REMERON) 7.5 MG tablet Take 1 tablet (7.5 mg total) by mouth at bedtime. 30 tablet 1  . prochlorperazine (COMPAZINE) 10 MG tablet Take 1 tablet (10 mg total) by mouth every 6 (six) hours as needed for nausea or vomiting. (Patient not taking: Reported on 04/06/2019) 30 tablet 0   No current facility-administered medications for this visit.   Facility-Administered Medications Ordered in Other Visits  Medication Dose Route Frequency Provider Last Rate Last Admin  . acetaminophen (TYLENOL) tablet 650 mg  650 mg Oral Once Nicholas Lose, MD      . CARBOplatin (PARAPLATIN) 250 mg in sodium chloride 0.9 % 100 mL chemo infusion  250 mg Intravenous Once Curt Bears, MD      . famotidine (PEPCID) IVPB 20 mg premix  20 mg Intravenous Once Curt Bears, MD 200 mL/hr at 04/06/19 1249 20 mg at 04/06/19 1249  . fosaprepitant (EMEND) 150 mg in sodium chloride 0.9 % 145 mL IVPB  150 mg Intravenous Once Curt Bears, MD      . heparin lock flush 100 unit/mL  500 Units Intracatheter Once PRN Curt Bears, MD      . PACLitaxel (TAXOL) 318 mg in sodium chloride 0.9 % 500 mL chemo infusion (> 80mg /m2)  175 mg/m2 (Treatment Plan Recorded) Intravenous Once Curt Bears, MD      . sodium chloride flush (NS) 0.9 % injection 10 mL  10 mL Intracatheter PRN Curt Bears, MD        SURGICAL HISTORY:  Past Surgical History:  Procedure Laterality Date  . BRONCHIAL BIOPSY  11/23/2017   Procedure: BRONCHIAL BIOPSIES;  Surgeon: Grace Isaac, MD;  Location: [facility] OR;  Service: Thoracic;;  . cataract surgery  2007, 2008   repair bilat  . DG BARIUM ENEMA ([place] HX) N/A 2016   [name]  . HERNIA REPAIR  04/25/2001  . IR IMAGING GUIDED PORT INSERTION  07/05/2018  . IR US GUIDE BX ASP/DRAIN  11/17/2017  . KNEE SURGERY     meniscus tear per Dr. Ronnie Derby, R knee  . MULTIPLE TOOTH EXTRACTIONS    . SEPTOPLASTY  2006   Dr. Ernesto Rutherford  . SKIN CANCER EXCISION    . TONSILLECTOMY  1954  .  VAGINAL HYSTERECTOMY    . VIDEO BRONCHOSCOPY WITH ENDOBRONCHIAL ULTRASOUND N/A 11/23/2017   Procedure: VIDEO BRONCHOSCOPY WITH ENDOBRONCHIAL ULTRASOUND;  Surgeon: Grace Isaac, MD;  Location: [facility];  Service: Thoracic;  Laterality: N/A;    REVIEW OF SYSTEMS:   Review of Systems  Constitutional: Positive for decreased appetite and fatigue. Negative for appetite change, chills, fever and unexpected weight change.  HENT: Negative for mouth sores, nosebleeds, sore throat and trouble swallowing.   Eyes: Negative for eye problems and icterus.  Respiratory: Positive for wheezing and baseline cough and shortness of breath with exertion. Negative for hemoptysis Cardiovascular: Positive for baseline bilateral lower extremity swelling. Negative for chest pain. Gastrointestinal: Negative for abdominal pain, constipation, diarrhea, nausea and vomiting.  Genitourinary: Negative for bladder incontinence, difficulty  urinating, dysuria, frequency and hematuria.   Musculoskeletal: Negative for back pain, gait problem, neck pain and neck stiffness.  Skin: Negative for itching and rash.  Neurological: Negative for dizziness, extremity weakness, gait problem, headaches, light-headedness and seizures.  Hematological: Negative for adenopathy. Does not bruise/bleed easily.  Psychiatric/Behavioral: Negative for confusion, depression and sleep disturbance. The patient is not nervous/anxious.     PHYSICAL EXAMINATION:  Blood pressure 125/74, pulse 88, temperature 98 F (36.7 C), temperature source Temporal, resp. rate 17, height 5\' 8"  (1.727 m), weight 148 lb 6.4 oz (67.3 kg), SpO2 96 %.  ECOG PERFORMANCE STATUS: 1 - Symptomatic but completely ambulatory  Physical Exam  Constitutional: Oriented to person, place, and time and well-developed, well-nourished, and in no distress.  HENT:  Head: Normocephalic and atraumatic.  Mouth/Throat: Oropharynx is clear and moist. No oropharyngeal exudate.  Eyes:  Conjunctivae are normal. Right eye exhibits no discharge. Left eye exhibits no discharge. No scleral icterus.  Neck: Normal range of motion. Neck supple.  Cardiovascular: Normal rate, regular rhythm, normal heart sounds and intact distal pulses.   Pulmonary/Chest: Effort normal. Wheezing/rhonchi in all lung fields. No respiratory distress. No wheezes. No rales.  Abdominal: Soft. Bowel sounds are normal. Exhibits no distension and no mass. There is no tenderness.  Musculoskeletal: Normal range of motion. Exhibits no edema.  Lymphadenopathy:    No cervical adenopathy.  Neurological: Alert and oriented to person, place, and time. Exhibits normal muscle tone. Gait normal. Coordination normal.  Skin: Skin is warm and dry. No rash noted. Not diaphoretic. No erythema. No pallor.  Psychiatric: Mood, memory and judgment normal.  Vitals reviewed.  LABORATORY DATA: Lab Results  Component Value Date   WBC 6.0 04/06/2019   HGB 11.1 (L) 04/06/2019   HCT 34.9 (L) 04/06/2019   MCV 89.7 04/06/2019   PLT 157 04/06/2019      Chemistry      Component Value Date/Time   NA 142 04/06/2019 1035   K 4.0 04/06/2019 1035   CL 108 04/06/2019 1035   CO2 28 04/06/2019 1035   BUN 23 04/06/2019 1035   CREATININE 1.42 (H) 04/06/2019 1035      Component Value Date/Time   CALCIUM 9.0 04/06/2019 1035   ALKPHOS 74 04/06/2019 1035   AST 16 04/06/2019 1035   ALT 9 04/06/2019 1035   BILITOT 0.4 04/06/2019 1035       RADIOGRAPHIC STUDIES:  CT Chest Wo Contrast  Result Date: 03/07/2019 CLINICAL DATA:  86 year old female with history of squamous cell carcinoma of the right lung. Follow-up study. EXAM: CT CHEST WITHOUT CONTRAST TECHNIQUE: Multidetector CT imaging of the chest was performed following the standard protocol without IV contrast. COMPARISON:  Chest CT 11/11/2018. FINDINGS: Cardiovascular: Heart size is normal. There is no significant pericardial fluid, thickening or pericardial calcification. There is  aortic atherosclerosis, as well as atherosclerosis of the great vessels of the mediastinum and the coronary arteries, including calcified atherosclerotic plaque in the right coronary artery. Aneurysmal dilatation of the ascending thoracic aorta (5.4 cm in diameter). Right internal jugular single-lumen porta cath with tip terminating at the superior cavoatrial junction. Mediastinum/Nodes: Soft tissue fullness in the right hilar region, indistinguishable from parahilar mass (discussed below). No other definite lymphadenopathy identified in the mediastinal or left hilar nodal stations. Esophagus is unremarkable in appearance. No axillary lymphadenopathy. Lungs/Pleura: Compared to the prior study there is an increasingly convex mass-like appearance of the parahilar region of the right lung, highly concerning for recurrent neoplasm. This currently measures  approximately 6.6 x 4.6 x 4.5 cm (axial image 74 of series 2 and coronal image 64 of series 6). Surrounding areas of architectural distortion compatible with chronic postradiation changes, similar to prior examinations. Small right pleural effusion lying dependently. Left lung is clear. No left pleural effusion Upper Abdomen: Aortic atherosclerosis. Multiple low-attenuation lesions in both kidneys, incompletely characterized on today's non-contrast CT examination, but similar to the prior study and statistically likely to represent cysts. Musculoskeletal: Chronic compression fracture of the inferior endplate of H82 with 99% loss of anterior vertebral body height. There are no aggressive appearing lytic or blastic lesions noted in the visualized portions of the skeleton. IMPRESSION: 1. Findings are highly concerning for recurrent neoplasm in the central aspect of the right lung, as detailed above. Further evaluation with nonemergent PET-CT is strongly recommended in the near future. 2. Aortic atherosclerosis, in addition to right coronary artery disease. 3. Aneurysmal  dilatation of the ascending thoracic aorta (5.4 cm in diameter). Ascending thoracic aortic aneurysm. Recommend semi-annual imaging followup by CTA or MRA and referral to cardiothoracic surgery if not already obtained. This recommendation follows 2010 ACCF/AHA/AATS/ACR/ASA/SCA/SCAI/SIR/STS/SVM Guidelines for the Diagnosis and Management of Patients With Thoracic Aortic Disease. Circulation. 2010; 121: B716-R678. Aortic aneurysm NOS (ICD10-I71.9). Aortic Atherosclerosis (ICD10-I70.0). Aortic aneurysm NOS (ICD10-I71.9). Electronically Signed   By: Vinnie Langton M.D.   On: 03/07/2019 20:13   NM PET Image Restag (PS) Skull Base To Thigh  Result Date: 03/21/2019 CLINICAL DATA:  Subsequent treatment strategy for lung carcinoma. History of squamous cell carcinoma RIGHT lung. Suspicious lesion identified on surveillance CT. EXAM: NUCLEAR MEDICINE PET SKULL BASE TO THIGH TECHNIQUE: 7.9 mCi F-18 FDG was injected intravenously. Full-ring PET imaging was performed from the skull base to thigh after the radiotracer. CT data was obtained and used for attenuation correction and anatomic localization. Fasting blood glucose: 90 mg/dl COMPARISON:  None. FINDINGS: Mediastinal blood pool activity: SUV max 2.7 Liver activity: SUV max one NECK: No hypermetabolic lymph nodes in the neck. Incidental CT findings: none CHEST: Rounded soft tissue in the RIGHT infrahilar lung measuring 5.7 x 4.2 cm (image 71/4) has intense peripheral metabolic activity with SUV max equal 16.7. There is a band of ground-glass density in the RIGHT upper lobe which is ovoid measuring 4.8 x 2.6 cm also with intense metabolic activity with SUV max equal 8.8 (image 65/4). There is hypermetabolic activity in the medial aspect of the RIGHT middle lobe also associated ground-glass density. In the periphery of the LEFT lower lobe there is a ground-glass band with intense metabolic activity along the pleural surface measuring 3.5 cm with SUV max equal 8.5. Small  hypermetabolic precarinal lymph node with SUV max equal 7.6 new comparison PET-CT scan Incidental CT findings: None ABDOMEN/PELVIS: No abnormal metabolic activity liver. Adrenal glands normal. 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 Large cyst extending from the lower pole LEFT kidney. Activity in the RIGHT lower quadrant abdominal wall is favored related to a hernia plug. Incidental CT findings: none SKELETON: No focal hypermetabolic activity to suggest skeletal metastasis. Incidental CT findings: none IMPRESSION: 1. Rounded hypermetabolic soft tissue mass inferior to the RIGHT hilum most consistent with lung cancer recurrence. 2. Bands of ground-glass density within the RIGHT upper lobe, RIGHT middle lobe and LEFT lower lobe are favored inflammatory or potentially related to immunotherapy therapy. Malignancy is less favored 3. Hypermetabolic precarinal lymph node concerning for nodal metastasis. 4. No evidence of metastatic disease in the abdomen pelvis. These results will be called to the ordering clinician  or representative by the Radiologist Assistant, and communication documented in the PACS or zVision Dashboard. Electronically Signed   By: Suzy Bouchard M.D.   On: 03/21/2019 17:37     ASSESSMENT/PLAN:  This is a very pleasant 86 year old Caucasian female with recurrent non-small cell lung cancer initially diagnosed stage IIIB (T3, N3, M0) non-small cell lung cancer, squamous cell carcinoma presented with large right middle lobe lung mass in addition to right hilar, subcarinal and right supraclavicular lymphadenopathy diagnosed in August 2019.  The patient had evidence for disease progression in December 2020.  She completed a course of concurrent chemoradiation with carboplatin for an AUC of 2 and paclitaxel 5 mg/m.  She is status post 6 cycles with a partial response.  She then completed consolidation immunotherapy with Imfinzi 10 mg/kg IV every 2 weeks.  She is status post 26 cycles.  She showed evidence  of disease progression on her restaging CT scan.  The patient is currently undergoing treatment with carboplatin for an AUC of 5, paclitaxel 175 mg/m, Keytruda 200 mg IV every 3 weeks with Neulasta support.  The patient is expected to start her first dose today 04/06/2019.   Labs were reviewed with the patient.  We are experiencing some challenges with insurance approval of Keytruda as well as her Neulasta.  The patient will proceed with carboplatin and paclitaxel only today.  Hopefully, will be able to add Keytruda starting from cycle #2.   We will see the patient back for follow-up visit in 3 weeks for evaluation before starting cycle #2.  I have sent a refill of the patient's EMLA cream to her pharmacy.  Additionally I have sent a refill of 7.5 mg p.o. of Remeron to take at nighttime for her decreased appetite/insomnia.  Due to the patient's wheezing, I sent a prescription for an albuterol inhaler to her pharmacy.  The patient was advised to call immediately if she has any concerning symptoms in the interval. The patient voices understanding of current disease status and treatment options and is in agreement with the current care plan. All questions were answered. The patient knows to call the clinic with any problems, questions or concerns. We can certainly see the patient much sooner if necessary   Orders Placed This Encounter  Procedures  . TSH Every 3 Weeks    Standing Status:   Standing    Number of Occurrences:   18    Standing Expiration Date:   04/05/2020     Malyna Budney L Huxley Shurley, PA-C 04/06/19

## 2019-04-06 NOTE — Patient Instructions (Signed)

## 2019-04-07 ENCOUNTER — Telehealth: Payer: Self-pay

## 2019-04-07 DIAGNOSIS — E039 Hypothyroidism, unspecified: Secondary | ICD-10-CM

## 2019-04-07 NOTE — Telephone Encounter (Signed)
Okay with me, please notify pt.  Would recheck TSH in about 2 months after the manufacturer change.  No change in dose/strength.  Thanks.  TSH ordered.

## 2019-04-07 NOTE — Telephone Encounter (Signed)
Gildardo Cranker with [facility] Left v/m that needs to change manufacturer for levothyroxine and needs OK to change manufacturer. Please advise.

## 2019-04-07 NOTE — Telephone Encounter (Signed)
Left message on VM for [facility]. Spoke to pt. She will ask the cancer center to draw TSH in 2 months.

## 2019-04-08 ENCOUNTER — Inpatient Hospital Stay: Payer: Medicare Other

## 2019-04-08 ENCOUNTER — Other Ambulatory Visit: Payer: Self-pay

## 2019-04-08 VITALS — BP 119/81 | HR 99 | Temp 98.2°F | Resp 20

## 2019-04-08 DIAGNOSIS — R630 Anorexia: Secondary | ICD-10-CM | POA: Diagnosis not present

## 2019-04-08 DIAGNOSIS — Z79899 Other long term (current) drug therapy: Secondary | ICD-10-CM | POA: Diagnosis not present

## 2019-04-08 DIAGNOSIS — Z5111 Encounter for antineoplastic chemotherapy: Secondary | ICD-10-CM | POA: Diagnosis not present

## 2019-04-08 DIAGNOSIS — R05 Cough: Secondary | ICD-10-CM | POA: Diagnosis not present

## 2019-04-08 DIAGNOSIS — Z85820 Personal history of malignant melanoma of skin: Secondary | ICD-10-CM | POA: Diagnosis not present

## 2019-04-08 DIAGNOSIS — Z7689 Persons encountering health services in other specified circumstances: Secondary | ICD-10-CM | POA: Diagnosis not present

## 2019-04-08 DIAGNOSIS — R062 Wheezing: Secondary | ICD-10-CM | POA: Diagnosis not present

## 2019-04-08 DIAGNOSIS — Z5112 Encounter for antineoplastic immunotherapy: Secondary | ICD-10-CM | POA: Diagnosis not present

## 2019-04-08 DIAGNOSIS — R5383 Other fatigue: Secondary | ICD-10-CM | POA: Diagnosis not present

## 2019-04-08 DIAGNOSIS — C3431 Malignant neoplasm of lower lobe, right bronchus or lung: Secondary | ICD-10-CM | POA: Diagnosis not present

## 2019-04-08 DIAGNOSIS — Z7901 Long term (current) use of anticoagulants: Secondary | ICD-10-CM | POA: Diagnosis not present

## 2019-04-08 MED ORDER — PEGFILGRASTIM-JMDB 6 MG/0.6ML ~~LOC~~ SOSY
6.0000 mg | PREFILLED_SYRINGE | Freq: Once | SUBCUTANEOUS | Status: AC
  Administered 2019-04-08: 6 mg via SUBCUTANEOUS

## 2019-04-08 NOTE — Patient Instructions (Signed)
[facility] Discharge Instructions for Patients Receiving Chemotherapy  Today you received the following chemotherapy agents Fulphila  To help prevent nausea and vomiting after your treatment, we encourage you to take your nausea medication  If you develop nausea and vomiting that is not controlled by your nausea medication, call the clinic.   BELOW ARE SYMPTOMS THAT SHOULD BE REPORTED IMMEDIATELY:  *FEVER GREATER THAN 100.5 F  *CHILLS WITH OR WITHOUT FEVER  NAUSEA AND VOMITING THAT IS NOT CONTROLLED WITH YOUR NAUSEA MEDICATION  *UNUSUAL SHORTNESS OF BREATH  *UNUSUAL BRUISING OR BLEEDING  TENDERNESS IN MOUTH AND THROAT WITH OR WITHOUT PRESENCE OF ULCERS  *URINARY PROBLEMS  *BOWEL PROBLEMS  UNUSUAL RASH Items with * indicate a potential emergency and should be followed up as soon as possible.  Feel free to call the clinic should you have any questions or concerns. The clinic phone number is [phone].  Please show the [facility] at check-in to the Emergency Department and triage nurse.

## 2019-04-10 ENCOUNTER — Telehealth: Payer: Self-pay | Admitting: *Deleted

## 2019-04-10 NOTE — Telephone Encounter (Signed)
Received message from [facility] stating that Lisa [name] has been approved 04/06/19-08/31/19

## 2019-04-12 ENCOUNTER — Inpatient Hospital Stay: Payer: Medicare Other

## 2019-04-12 ENCOUNTER — Other Ambulatory Visit: Payer: Self-pay

## 2019-04-12 DIAGNOSIS — C3491 Malignant neoplasm of unspecified part of right bronchus or lung: Secondary | ICD-10-CM

## 2019-04-12 DIAGNOSIS — C3431 Malignant neoplasm of lower lobe, right bronchus or lung: Secondary | ICD-10-CM | POA: Diagnosis not present

## 2019-04-12 DIAGNOSIS — Z95828 Presence of other vascular implants and grafts: Secondary | ICD-10-CM

## 2019-04-12 LAB — CBC WITH DIFFERENTIAL (CANCER CENTER ONLY)
Abs Immature Granulocytes: 0.06 10*3/uL (ref 0.00–0.07)
Basophils Absolute: 0.1 10*3/uL (ref 0.0–0.1)
Basophils Relative: 1 %
Eosinophils Absolute: 0.2 10*3/uL (ref 0.0–0.5)
Eosinophils Relative: 4 %
HCT: 34.5 % — ABNORMAL LOW (ref 36.0–46.0)
Hemoglobin: 10.7 g/dL — ABNORMAL LOW (ref 12.0–15.0)
Immature Granulocytes: 1 %
Lymphocytes Relative: 12 %
Lymphs Abs: 0.5 10*3/uL — ABNORMAL LOW (ref 0.7–4.0)
MCH: 28.4 pg (ref 26.0–34.0)
MCHC: 31 g/dL (ref 30.0–36.0)
MCV: 91.5 fL (ref 80.0–100.0)
Monocytes Absolute: 0.1 10*3/uL (ref 0.1–1.0)
Monocytes Relative: 3 %
Neutro Abs: 3.5 10*3/uL (ref 1.7–7.7)
Neutrophils Relative %: 79 %
Platelet Count: 66 10*3/uL — ABNORMAL LOW (ref 150–400)
RBC: 3.77 MIL/uL — ABNORMAL LOW (ref 3.87–5.11)
RDW: 13.8 % (ref 11.5–15.5)
WBC Count: 4.4 10*3/uL (ref 4.0–10.5)
nRBC: 0 % (ref 0.0–0.2)

## 2019-04-12 LAB — CMP (CANCER CENTER ONLY)
ALT: 17 U/L (ref 0–44)
AST: 22 U/L (ref 15–41)
Albumin: 3.3 g/dL — ABNORMAL LOW (ref 3.5–5.0)
Alkaline Phosphatase: 80 U/L (ref 38–126)
Anion gap: 9 (ref 5–15)
BUN: 27 mg/dL — ABNORMAL HIGH (ref 8–23)
CO2: 26 mmol/L (ref 22–32)
Calcium: 8.6 mg/dL — ABNORMAL LOW (ref 8.9–10.3)
Chloride: 106 mmol/L (ref 98–111)
Creatinine: 1.46 mg/dL — ABNORMAL HIGH (ref 0.44–1.00)
GFR, Est AFR Am: 38 mL/min — ABNORMAL LOW (ref >60)
GFR, Estimated: 33 mL/min — ABNORMAL LOW (ref >60)
Glucose, Bld: 94 mg/dL (ref 70–99)
Potassium: 4.2 mmol/L (ref 3.5–5.1)
Sodium: 141 mmol/L (ref 135–145)
Total Bilirubin: 0.6 mg/dL (ref 0.3–1.2)
Total Protein: 6.2 g/dL — ABNORMAL LOW (ref 6.5–8.1)

## 2019-04-12 LAB — TSH: TSH: 5.443 u[IU]/mL — ABNORMAL HIGH (ref 0.308–3.960)

## 2019-04-12 MED ORDER — HEPARIN SOD (PORK) LOCK FLUSH 100 UNIT/ML IV SOLN
500.0000 [IU] | Freq: Once | INTRAVENOUS | Status: AC
  Administered 2019-04-12: 500 [IU]
  Filled 2019-04-12: qty 5

## 2019-04-12 MED ORDER — SODIUM CHLORIDE 0.9% FLUSH
10.0000 mL | Freq: Once | INTRAVENOUS | Status: AC
  Administered 2019-04-12: 10 mL
  Filled 2019-04-12: qty 10

## 2019-04-19 ENCOUNTER — Inpatient Hospital Stay: Payer: Medicare Other

## 2019-04-19 ENCOUNTER — Other Ambulatory Visit: Payer: Self-pay

## 2019-04-19 DIAGNOSIS — Z95828 Presence of other vascular implants and grafts: Secondary | ICD-10-CM

## 2019-04-19 DIAGNOSIS — C3491 Malignant neoplasm of unspecified part of right bronchus or lung: Secondary | ICD-10-CM

## 2019-04-19 DIAGNOSIS — C3431 Malignant neoplasm of lower lobe, right bronchus or lung: Secondary | ICD-10-CM | POA: Diagnosis not present

## 2019-04-19 LAB — CMP (CANCER CENTER ONLY)
ALT: 14 U/L (ref 0–44)
AST: 25 U/L (ref 15–41)
Albumin: 3.1 g/dL — ABNORMAL LOW (ref 3.5–5.0)
Alkaline Phosphatase: 109 U/L (ref 38–126)
Anion gap: 9 (ref 5–15)
BUN: 17 mg/dL (ref 8–23)
CO2: 24 mmol/L (ref 22–32)
Calcium: 8.6 mg/dL — ABNORMAL LOW (ref 8.9–10.3)
Chloride: 111 mmol/L (ref 98–111)
Creatinine: 1.42 mg/dL — ABNORMAL HIGH (ref 0.44–1.00)
GFR, Est AFR Am: 39 mL/min — ABNORMAL LOW (ref >60)
GFR, Estimated: 34 mL/min — ABNORMAL LOW (ref >60)
Glucose, Bld: 102 mg/dL — ABNORMAL HIGH (ref 70–99)
Potassium: 3.8 mmol/L (ref 3.5–5.1)
Sodium: 144 mmol/L (ref 135–145)
Total Bilirubin: <0.2 mg/dL — ABNORMAL LOW (ref 0.3–1.2)
Total Protein: 6.2 g/dL — ABNORMAL LOW (ref 6.5–8.1)

## 2019-04-19 LAB — CBC WITH DIFFERENTIAL (CANCER CENTER ONLY)
Abs Immature Granulocytes: 0.82 10*3/uL — ABNORMAL HIGH (ref 0.00–0.07)
Basophils Absolute: 0.1 10*3/uL (ref 0.0–0.1)
Basophils Relative: 1 %
Eosinophils Absolute: 0.1 10*3/uL (ref 0.0–0.5)
Eosinophils Relative: 1 %
HCT: 32 % — ABNORMAL LOW (ref 36.0–46.0)
Hemoglobin: 9.9 g/dL — ABNORMAL LOW (ref 12.0–15.0)
Immature Granulocytes: 8 %
Lymphocytes Relative: 12 %
Lymphs Abs: 1.4 10*3/uL (ref 0.7–4.0)
MCH: 28.8 pg (ref 26.0–34.0)
MCHC: 30.9 g/dL (ref 30.0–36.0)
MCV: 93 fL (ref 80.0–100.0)
Monocytes Absolute: 0.6 10*3/uL (ref 0.1–1.0)
Monocytes Relative: 6 %
Neutro Abs: 8 10*3/uL — ABNORMAL HIGH (ref 1.7–7.7)
Neutrophils Relative %: 72 %
Platelet Count: 102 10*3/uL — ABNORMAL LOW (ref 150–400)
RBC: 3.44 MIL/uL — ABNORMAL LOW (ref 3.87–5.11)
RDW: 15 % (ref 11.5–15.5)
WBC Count: 11 10*3/uL — ABNORMAL HIGH (ref 4.0–10.5)
nRBC: 0.2 % (ref 0.0–0.2)

## 2019-04-19 MED ORDER — HEPARIN SOD (PORK) LOCK FLUSH 100 UNIT/ML IV SOLN
500.0000 [IU] | Freq: Once | INTRAVENOUS | Status: AC
  Administered 2019-04-19: 500 [IU]
  Filled 2019-04-19: qty 5

## 2019-04-19 MED ORDER — SODIUM CHLORIDE 0.9% FLUSH
10.0000 mL | Freq: Once | INTRAVENOUS | Status: AC
  Administered 2019-04-19: 10 mL
  Filled 2019-04-19: qty 10

## 2019-04-25 ENCOUNTER — Encounter: Payer: Self-pay | Admitting: Internal Medicine

## 2019-04-25 ENCOUNTER — Inpatient Hospital Stay: Payer: Medicare Other

## 2019-04-25 ENCOUNTER — Inpatient Hospital Stay (HOSPITAL_BASED_OUTPATIENT_CLINIC_OR_DEPARTMENT_OTHER): Payer: Medicare Other | Admitting: Internal Medicine

## 2019-04-25 ENCOUNTER — Other Ambulatory Visit: Payer: Self-pay

## 2019-04-25 VITALS — BP 141/85 | HR 98 | Temp 98.3°F | Resp 17 | Ht 68.0 in | Wt 150.7 lb

## 2019-04-25 DIAGNOSIS — I10 Essential (primary) hypertension: Secondary | ICD-10-CM

## 2019-04-25 DIAGNOSIS — C3491 Malignant neoplasm of unspecified part of right bronchus or lung: Secondary | ICD-10-CM

## 2019-04-25 DIAGNOSIS — Z5111 Encounter for antineoplastic chemotherapy: Secondary | ICD-10-CM | POA: Diagnosis not present

## 2019-04-25 DIAGNOSIS — Z95828 Presence of other vascular implants and grafts: Secondary | ICD-10-CM

## 2019-04-25 DIAGNOSIS — C3431 Malignant neoplasm of lower lobe, right bronchus or lung: Secondary | ICD-10-CM | POA: Diagnosis not present

## 2019-04-25 DIAGNOSIS — R5383 Other fatigue: Secondary | ICD-10-CM | POA: Diagnosis not present

## 2019-04-25 DIAGNOSIS — R630 Anorexia: Secondary | ICD-10-CM | POA: Diagnosis not present

## 2019-04-25 DIAGNOSIS — R05 Cough: Secondary | ICD-10-CM | POA: Diagnosis not present

## 2019-04-25 DIAGNOSIS — Z85820 Personal history of malignant melanoma of skin: Secondary | ICD-10-CM | POA: Diagnosis not present

## 2019-04-25 DIAGNOSIS — Z7689 Persons encountering health services in other specified circumstances: Secondary | ICD-10-CM | POA: Diagnosis not present

## 2019-04-25 DIAGNOSIS — Z79899 Other long term (current) drug therapy: Secondary | ICD-10-CM | POA: Diagnosis not present

## 2019-04-25 DIAGNOSIS — R062 Wheezing: Secondary | ICD-10-CM | POA: Diagnosis not present

## 2019-04-25 DIAGNOSIS — Z5112 Encounter for antineoplastic immunotherapy: Secondary | ICD-10-CM

## 2019-04-25 DIAGNOSIS — Z7901 Long term (current) use of anticoagulants: Secondary | ICD-10-CM | POA: Diagnosis not present

## 2019-04-25 LAB — CBC WITH DIFFERENTIAL (CANCER CENTER ONLY)
Abs Immature Granulocytes: 0.05 10*3/uL (ref 0.00–0.07)
Basophils Absolute: 0 10*3/uL (ref 0.0–0.1)
Basophils Relative: 1 %
Eosinophils Absolute: 0 10*3/uL (ref 0.0–0.5)
Eosinophils Relative: 1 %
HCT: 31.9 % — ABNORMAL LOW (ref 36.0–46.0)
Hemoglobin: 9.9 g/dL — ABNORMAL LOW (ref 12.0–15.0)
Immature Granulocytes: 1 %
Lymphocytes Relative: 14 %
Lymphs Abs: 0.8 10*3/uL (ref 0.7–4.0)
MCH: 28.5 pg (ref 26.0–34.0)
MCHC: 31 g/dL (ref 30.0–36.0)
MCV: 91.9 fL (ref 80.0–100.0)
Monocytes Absolute: 0.4 10*3/uL (ref 0.1–1.0)
Monocytes Relative: 8 %
Neutro Abs: 4.4 10*3/uL (ref 1.7–7.7)
Neutrophils Relative %: 75 %
Platelet Count: 118 10*3/uL — ABNORMAL LOW (ref 150–400)
RBC: 3.47 MIL/uL — ABNORMAL LOW (ref 3.87–5.11)
RDW: 15.2 % (ref 11.5–15.5)
WBC Count: 5.7 10*3/uL (ref 4.0–10.5)
nRBC: 0 % (ref 0.0–0.2)

## 2019-04-25 LAB — CMP (CANCER CENTER ONLY)
ALT: 12 U/L (ref 0–44)
AST: 19 U/L (ref 15–41)
Albumin: 3.2 g/dL — ABNORMAL LOW (ref 3.5–5.0)
Alkaline Phosphatase: 96 U/L (ref 38–126)
Anion gap: 8 (ref 5–15)
BUN: 21 mg/dL (ref 8–23)
CO2: 26 mmol/L (ref 22–32)
Calcium: 9 mg/dL (ref 8.9–10.3)
Chloride: 110 mmol/L (ref 98–111)
Creatinine: 1.33 mg/dL — ABNORMAL HIGH (ref 0.44–1.00)
GFR, Est AFR Am: 42 mL/min — ABNORMAL LOW (ref >60)
GFR, Estimated: 37 mL/min — ABNORMAL LOW (ref >60)
Glucose, Bld: 90 mg/dL (ref 70–99)
Potassium: 3.9 mmol/L (ref 3.5–5.1)
Sodium: 144 mmol/L (ref 135–145)
Total Bilirubin: 0.4 mg/dL (ref 0.3–1.2)
Total Protein: 6.4 g/dL — ABNORMAL LOW (ref 6.5–8.1)

## 2019-04-25 MED ORDER — SODIUM CHLORIDE 0.9 % IV SOLN
175.0000 mg/m2 | Freq: Once | INTRAVENOUS | Status: AC
  Administered 2019-04-25: 318 mg via INTRAVENOUS
  Filled 2019-04-25: qty 53

## 2019-04-25 MED ORDER — SODIUM CHLORIDE 0.9 % IV SOLN
283.0000 mg | Freq: Once | INTRAVENOUS | Status: AC
  Administered 2019-04-25: 280 mg via INTRAVENOUS
  Filled 2019-04-25: qty 28

## 2019-04-25 MED ORDER — SODIUM CHLORIDE 0.9% FLUSH
10.0000 mL | INTRAVENOUS | Status: DC | PRN
  Administered 2019-04-25: 10 mL
  Filled 2019-04-25: qty 10

## 2019-04-25 MED ORDER — PALONOSETRON HCL INJECTION 0.25 MG/5ML
INTRAVENOUS | Status: AC
  Filled 2019-04-25: qty 5

## 2019-04-25 MED ORDER — DEXAMETHASONE SODIUM PHOSPHATE 10 MG/ML IJ SOLN
10.0000 mg | Freq: Once | INTRAMUSCULAR | Status: AC
  Administered 2019-04-25: 10 mg via INTRAVENOUS

## 2019-04-25 MED ORDER — SODIUM CHLORIDE 0.9 % IV SOLN
200.0000 mg | Freq: Once | INTRAVENOUS | Status: AC
  Administered 2019-04-25: 200 mg via INTRAVENOUS
  Filled 2019-04-25: qty 8

## 2019-04-25 MED ORDER — SODIUM CHLORIDE 0.9 % IV SOLN
Freq: Once | INTRAVENOUS | Status: AC
  Filled 2019-04-25: qty 250

## 2019-04-25 MED ORDER — DEXAMETHASONE SODIUM PHOSPHATE 10 MG/ML IJ SOLN
INTRAMUSCULAR | Status: AC
  Filled 2019-04-25: qty 1

## 2019-04-25 MED ORDER — SODIUM CHLORIDE 0.9% FLUSH
10.0000 mL | Freq: Once | INTRAVENOUS | Status: AC
  Administered 2019-04-25: 10 mL
  Filled 2019-04-25: qty 10

## 2019-04-25 MED ORDER — FAMOTIDINE IN NACL 20-0.9 MG/50ML-% IV SOLN
20.0000 mg | Freq: Once | INTRAVENOUS | Status: AC
  Administered 2019-04-25: 20 mg via INTRAVENOUS

## 2019-04-25 MED ORDER — DIPHENHYDRAMINE HCL 50 MG/ML IJ SOLN
50.0000 mg | Freq: Once | INTRAMUSCULAR | Status: AC
  Administered 2019-04-25: 50 mg via INTRAVENOUS

## 2019-04-25 MED ORDER — SODIUM CHLORIDE 0.9 % IV SOLN
150.0000 mg | Freq: Once | INTRAVENOUS | Status: AC
  Administered 2019-04-25: 150 mg via INTRAVENOUS
  Filled 2019-04-25: qty 150

## 2019-04-25 MED ORDER — PALONOSETRON HCL INJECTION 0.25 MG/5ML
0.2500 mg | Freq: Once | INTRAVENOUS | Status: AC
  Administered 2019-04-25: 0.25 mg via INTRAVENOUS

## 2019-04-25 MED ORDER — DIPHENHYDRAMINE HCL 50 MG/ML IJ SOLN
INTRAMUSCULAR | Status: AC
  Filled 2019-04-25: qty 1

## 2019-04-25 MED ORDER — FAMOTIDINE IN NACL 20-0.9 MG/50ML-% IV SOLN
INTRAVENOUS | Status: AC
  Filled 2019-04-25: qty 50

## 2019-04-25 MED ORDER — HEPARIN SOD (PORK) LOCK FLUSH 100 UNIT/ML IV SOLN
500.0000 [IU] | Freq: Once | INTRAVENOUS | Status: AC | PRN
  Administered 2019-04-25: 500 [IU]
  Filled 2019-04-25: qty 5

## 2019-04-25 NOTE — Patient Instructions (Signed)
[facility] Discharge Instructions for Patients Receiving Chemotherapy  Today you received the following chemotherapy agents Keytruda, Taxol and Carboplatin   To help prevent nausea and vomiting after your treatment, we encourage you to take your nausea medication as directed.    If you develop nausea and vomiting that is not controlled by your nausea medication, call the clinic.   BELOW ARE SYMPTOMS THAT SHOULD BE REPORTED IMMEDIATELY:  *FEVER GREATER THAN 100.5 F  *CHILLS WITH OR WITHOUT FEVER  NAUSEA AND VOMITING THAT IS NOT CONTROLLED WITH YOUR NAUSEA MEDICATION  *UNUSUAL SHORTNESS OF BREATH  *UNUSUAL BRUISING OR BLEEDING  TENDERNESS IN MOUTH AND THROAT WITH OR WITHOUT PRESENCE OF ULCERS  *URINARY PROBLEMS  *BOWEL PROBLEMS  UNUSUAL RASH Items with * indicate a potential emergency and should be followed up as soon as possible.  Feel free to call the clinic should you have any questions or concerns. The clinic phone number is [phone].  Please show the [facility] at check-in to the Emergency Department and triage nurse.  Pembrolizumab injection What is this medicine? PEMBROLIZUMAB (pem broe liz ue mab) is a monoclonal antibody. It is used to treat certain types of cancer. This medicine may be used for other purposes; ask your health care provider or pharmacist if you have questions. COMMON BRAND NAME(S): Keytruda What should I tell my health care provider before I take this medicine? They need to know if you have any of these conditions:  diabetes  immune system problems  inflammatory bowel disease  liver disease  lung or breathing disease  lupus  received or scheduled to receive an organ transplant or a stem-cell transplant that uses donor stem cells  an unusual or allergic reaction to pembrolizumab, other medicines, foods, dyes, or preservatives  pregnant or trying to get pregnant  breast-feeding How should I use this  medicine? This medicine is for infusion into a vein. It is given by a health care professional in a hospital or clinic setting. A special MedGuide will be given to you before each treatment. Be sure to read this information carefully each time. Talk to your pediatrician regarding the use of this medicine in children. While this drug may be prescribed for children as Jaylee Freeze as 6 months for selected conditions, precautions do apply. Overdosage: If you think you have taken too much of this medicine contact a poison control center or emergency room at once. NOTE: This medicine is only for you. Do not share this medicine with others. What if I miss a dose? It is important not to miss your dose. Call your doctor or health care professional if you are unable to keep an appointment. What may interact with this medicine? Interactions have not been studied. Give your health care provider a list of all the medicines, herbs, non-prescription drugs, or dietary supplements you use. Also tell them if you smoke, drink alcohol, or use illegal drugs. Some items may interact with your medicine. This list may not describe all possible interactions. Give your health care provider a list of all the medicines, herbs, non-prescription drugs, or dietary supplements you use. Also tell them if you smoke, drink alcohol, or use illegal drugs. Some items may interact with your medicine. What should I watch for while using this medicine? Your condition will be monitored carefully while you are receiving this medicine. You may need blood work done while you are taking this medicine. Do not become pregnant while taking this medicine or for 4 months  after stopping it. Women should inform their doctor if they wish to become pregnant or think they might be pregnant. There is a potential for serious side effects to an unborn child. Talk to your health care professional or pharmacist for more information. Do not breast-feed an infant while  taking this medicine or for 4 months after the last dose. What side effects may I notice from receiving this medicine? Side effects that you should report to your doctor or health care professional as soon as possible:  allergic reactions like skin rash, itching or hives, swelling of the face, lips, or tongue  bloody or black, tarry  breathing problems  changes in vision  chest pain  chills  confusion  constipation  cough  diarrhea  dizziness or feeling faint or lightheaded  fast or irregular heartbeat  fever  flushing  joint pain  low blood counts - this medicine may decrease the number of white blood cells, red blood cells and platelets. You may be at increased risk for infections and bleeding.  muscle pain  muscle weakness  pain, tingling, numbness in the hands or feet  persistent headache  redness, blistering, peeling or loosening of the skin, including inside the mouth  signs and symptoms of high blood sugar such as dizziness; dry mouth; dry skin; fruity breath; nausea; stomach pain; increased hunger or thirst; increased urination  signs and symptoms of kidney injury like trouble passing urine or change in the amount of urine  signs and symptoms of liver injury like dark urine, light-colored stools, loss of appetite, nausea, right upper belly pain, yellowing of the eyes or skin  sweating  swollen lymph nodes  weight loss Side effects that usually do not require medical attention (report to your doctor or health care professional if they continue or are bothersome):  decreased appetite  hair loss  muscle pain  tiredness This list may not describe all possible side effects. Call your doctor for medical advice about side effects. You may report side effects to FDA at 1-800-FDA-1088. Where should I keep my medicine? This drug is given in a hospital or clinic and will not be stored at home. NOTE: This sheet is a summary. It may not cover all  possible information. If you have questions about this medicine, talk to your doctor, pharmacist, or health care provider.  2020 Elsevier/Gold Standard (2019-01-20 18:07:58)

## 2019-04-25 NOTE — Progress Notes (Signed)
[facility] [phone]   [phone]  OFFICE PROGRESS NOTE  [name], MD [place] [address]  DIAGNOSIS: Recurrent non-small cell lung cancer initially diagnosed stage IIIB (T3, N3, M0) non-small cell lung cancer, squamous cell carcinoma presented with large right middle lobe lung mass in addition to right hilar, subcarinal and right supraclavicular lymphadenopathy diagnosed in August 2019.  The patient had evidence for disease progression in December 2020.  PRIOR THERAPY:  1) Concurrent chemoradiation with weekly carboplatin for AUC of 2 and paclitaxel 45 NG/M2.  Status post 6 cycles with partial response. 2) Consolidation immunotherapy with Imfinzi 10 mg/KG every 2 weeks started February 28, 2018.  Status post 26 cycles.  Last cycle was given on February 21, 2019  CURRENT THERAPY: Systemic chemotherapy with carboplatin for AUC of 5, paclitaxel 175 mg/M2 and Keytruda 200 mg IV every 3 weeks.  First dose April 03, 2018.  Status post 1 cycle.  INTERVAL HISTORY: Lisa [name] 86 y.o. female returns to the clinic today for follow-up visit.  The patient tolerated the first cycle of her systemic chemotherapy fairly well.  She denied having any chest pain, shortness of breath, cough or hemoptysis but she continues to have wheezing.  She has albuterol at home but she does not use it.  She denied having any current nausea, vomiting, diarrhea or constipation.  She has no headache or visual changes.  She has no recent weight loss or night sweats.  The patient is here today for evaluation before starting cycle #2.  MEDICAL HISTORY: Past Medical History:  Diagnosis Date  . Diverticulosis   . Headache   . Hemorrhoids    internal and external  . Hyperlipidemia 01/29/1991  . Hypertension 10/29/1995  . Hypothyroidism 10/29/1995  . Lung mass    right middle lobe  . Melanoma ([place])    h/o, local excision. no chemo or rady.   .  Skin cancer (melanoma) ([facility])   . Tubular adenoma of colon   . Wears dentures     ALLERGIES:  is allergic to atorvastatin; celebrex [celecoxib]; cholecalciferol; and simvastatin.  MEDICATIONS:  Current Outpatient Medications  Medication Sig Dispense Refill  . albuterol (VENTOLIN HFA) 108 (90 Base) MCG/ACT inhaler Inhale 2 puffs into the lungs every 6 (six) hours as needed for wheezing or shortness of breath. 6.7 g 0  . amLODipine (NORVASC) 10 MG tablet Take 1 tablet by mouth once daily 90 tablet 2  . Cholecalciferol (VITAMIN D) 2000 units tablet Take 2,000 Units by mouth 2 (two) times daily.    . Coenzyme Q10 100 MG capsule Take 100 mg by mouth daily.     Marland Kitchen docusate sodium (COLACE) 100 MG capsule Take 100 mg by mouth daily as needed for mild constipation.    . fluticasone (FLONASE) 50 MCG/ACT nasal spray USE 2 SPRAYS IN EACH NOSTRIL DAILY AS NEEDED FOR ALLERGIES 48 g 3  . levothyroxine (SYNTHROID, LEVOTHROID) 75 MCG tablet Take 1 tablet by mouth once daily 90 tablet 3  . lidocaine-prilocaine (EMLA) cream Apply 1 application topically as needed. 30 g 0  . loratadine (CLARITIN) 10 MG tablet Take 1 tablet (10 mg total) by mouth daily as needed for allergies or rhinitis.    . mirtazapine (REMERON) 7.5 MG tablet Take 1 tablet (7.5 mg total) by mouth at bedtime. 30 tablet 1  . prochlorperazine (COMPAZINE) 10 MG tablet Take 1 tablet (10 mg total) by mouth every  6 (six) hours as needed for nausea or vomiting. (Patient not taking: Reported on 04/06/2019) 30 tablet 0  . sennosides-docusate sodium (SENOKOT-S) 8.6-50 MG tablet Take 1 tablet by mouth daily as needed for constipation.    [name] 20 MG TABS tablet TAKE 1 TABLET BY MOUTH ONCE DAILY WITH SUPPER 30 tablet 0   No current facility-administered medications for this visit.   Facility-Administered Medications Ordered in Other Visits  Medication Dose Route Frequency Provider Last Rate Last Admin  . acetaminophen (TYLENOL) tablet 650 mg  650 mg  Oral Once Nicholas Lose, MD        SURGICAL HISTORY:  Past Surgical History:  Procedure Laterality Date  . BRONCHIAL BIOPSY  11/23/2017   Procedure: BRONCHIAL BIOPSIES;  Surgeon: Grace Isaac, MD;  Location: [facility] OR;  Service: Thoracic;;  . cataract surgery  2007, 2008   repair bilat  . DG BARIUM ENEMA ([facility]) N/A 2016   [name]  . HERNIA REPAIR  04/25/2001  . IR IMAGING GUIDED PORT INSERTION  07/05/2018  . IR US GUIDE BX ASP/DRAIN  11/17/2017  . KNEE SURGERY     meniscus tear per Dr. Ronnie Derby, R knee  . MULTIPLE TOOTH EXTRACTIONS    . SEPTOPLASTY  2006   Dr. Ernesto Rutherford  . SKIN CANCER EXCISION    . TONSILLECTOMY  1954  . VAGINAL HYSTERECTOMY    . VIDEO BRONCHOSCOPY WITH ENDOBRONCHIAL ULTRASOUND N/A 11/23/2017   Procedure: VIDEO BRONCHOSCOPY WITH ENDOBRONCHIAL ULTRASOUND;  Surgeon: Grace Isaac, MD;  Location: [facility] OR;  Service: Thoracic;  Laterality: N/A;    REVIEW OF SYSTEMS:  A comprehensive review of systems was negative except for: Constitutional: positive for fatigue Respiratory: positive for dyspnea on exertion and wheezing   PHYSICAL EXAMINATION: General appearance: alert, cooperative, fatigued and no distress Head: Normocephalic, without obvious abnormality, atraumatic Neck: no adenopathy, no JVD, supple, symmetrical, trachea midline and thyroid not enlarged, symmetric, no tenderness/mass/nodules Lymph nodes: Cervical, supraclavicular, and axillary nodes normal. Resp: clear to auscultation bilaterally Back: symmetric, no curvature. ROM normal. No CVA tenderness. Cardio: regular rate and rhythm, S1, S2 normal, no murmur, click, rub or gallop GI: soft, non-tender; bowel sounds normal; no masses,  no organomegaly Extremities: extremities normal, atraumatic, no cyanosis or edema  ECOG PERFORMANCE STATUS: 1 - Symptomatic but completely ambulatory  Blood pressure (!) 141/85, pulse 98, temperature 98.3 F (36.8 C), temperature source Temporal, resp. rate 17, height 5'  8" (1.727 m), weight 150 lb 11.2 oz (68.4 kg), SpO2 96 %.  LABORATORY DATA: Lab Results  Component Value Date   WBC 5.7 04/25/2019   HGB 9.9 (L) 04/25/2019   HCT 31.9 (L) 04/25/2019   MCV 91.9 04/25/2019   PLT 118 (L) 04/25/2019      Chemistry      Component Value Date/Time   NA 144 04/19/2019 0955   K 3.8 04/19/2019 0955   CL 111 04/19/2019 0955   CO2 24 04/19/2019 0955   BUN 17 04/19/2019 0955   CREATININE 1.42 (H) 04/19/2019 0955      Component Value Date/Time   CALCIUM 8.6 (L) 04/19/2019 0955   ALKPHOS 109 04/19/2019 0955   AST 25 04/19/2019 0955   ALT 14 04/19/2019 0955   BILITOT <0.2 (L) 04/19/2019 0955       RADIOGRAPHIC STUDIES: No results found.  ASSESSMENT AND PLAN: This is a very pleasant 86 years old white female with stage IIIb non-small cell lung cancer, squamous cell carcinoma.  She is currently undergoing  a course of concurrent chemoradiation with weekly carboplatin and paclitaxel status post 6 cycles.  She tolerated this treatment well with partial response. The patient also completed the course of consolidation treatment with immunotherapy with Imfinzi status post 26 cycles.  She tolerated her previous treatment well. Her recent CT scan of the chest showed progressive consolidation in the right lung suspicious for disease recurrence but also worsening post radiation effect could not be excluded.  A PET scan showed rounded hypermetabolic soft tissue mass inferior to the right hilum consistent with lung cancer recurrence.  There was also hypermetabolic precarinal lymph node concerning for nodal metastasis but no evidence for metastatic disease in the abdomen or pelvis.  The patient also had groundglass density in the right upper lobe, right middle lobe as well as left lower lobe suspicious for inflammatory process and less likely malignancy. The patient started treatment with systemic chemotherapy with carboplatin for AUC of 5, paclitaxel 175 mg/M2 and Keytruda  200 mg IV every 3 weeks with Neulasta support status post 1 cycle.  She tolerated the first cycle of her treatment well with no concerning adverse effects. I recommended for her to proceed with cycle #2 today as planned. I will see her back for follow-up visit in 3 weeks for evaluation before the next cycle of her treatment. For the wheezing, I advised the patient to use albuterol on as-needed basis. She was advised to call immediately if she has any concerning symptoms in the interval. The patient voices understanding of current disease status and treatment options and is in agreement with the current care plan. All questions were answered. The patient knows to call the clinic with any problems, questions or concerns. We can certainly see the patient much sooner if necessary.  Disclaimer: This note was dictated with voice recognition software. Similar sounding words can inadvertently be transcribed and may not be corrected upon review.

## 2019-04-27 ENCOUNTER — Other Ambulatory Visit: Payer: Self-pay

## 2019-04-27 ENCOUNTER — Telehealth: Payer: Self-pay | Admitting: Internal Medicine

## 2019-04-27 ENCOUNTER — Inpatient Hospital Stay: Payer: Medicare Other

## 2019-04-27 VITALS — BP 109/56 | HR 95 | Temp 98.1°F | Resp 20

## 2019-04-27 DIAGNOSIS — Z7689 Persons encountering health services in other specified circumstances: Secondary | ICD-10-CM | POA: Diagnosis not present

## 2019-04-27 DIAGNOSIS — C3491 Malignant neoplasm of unspecified part of right bronchus or lung: Secondary | ICD-10-CM

## 2019-04-27 DIAGNOSIS — Z85820 Personal history of malignant melanoma of skin: Secondary | ICD-10-CM | POA: Diagnosis not present

## 2019-04-27 DIAGNOSIS — R5383 Other fatigue: Secondary | ICD-10-CM | POA: Diagnosis not present

## 2019-04-27 DIAGNOSIS — R05 Cough: Secondary | ICD-10-CM | POA: Diagnosis not present

## 2019-04-27 DIAGNOSIS — Z5111 Encounter for antineoplastic chemotherapy: Secondary | ICD-10-CM | POA: Diagnosis not present

## 2019-04-27 DIAGNOSIS — Z5112 Encounter for antineoplastic immunotherapy: Secondary | ICD-10-CM | POA: Diagnosis not present

## 2019-04-27 DIAGNOSIS — Z7901 Long term (current) use of anticoagulants: Secondary | ICD-10-CM | POA: Diagnosis not present

## 2019-04-27 DIAGNOSIS — Z79899 Other long term (current) drug therapy: Secondary | ICD-10-CM | POA: Diagnosis not present

## 2019-04-27 DIAGNOSIS — R062 Wheezing: Secondary | ICD-10-CM | POA: Diagnosis not present

## 2019-04-27 DIAGNOSIS — R630 Anorexia: Secondary | ICD-10-CM | POA: Diagnosis not present

## 2019-04-27 DIAGNOSIS — C3431 Malignant neoplasm of lower lobe, right bronchus or lung: Secondary | ICD-10-CM | POA: Diagnosis not present

## 2019-04-27 MED ORDER — PEGFILGRASTIM-JMDB 6 MG/0.6ML ~~LOC~~ SOSY
PREFILLED_SYRINGE | SUBCUTANEOUS | Status: AC
  Filled 2019-04-27: qty 0.6

## 2019-04-27 MED ORDER — PEGFILGRASTIM-JMDB 6 MG/0.6ML ~~LOC~~ SOSY
6.0000 mg | PREFILLED_SYRINGE | Freq: Once | SUBCUTANEOUS | Status: AC
  Administered 2019-04-27: 6 mg via SUBCUTANEOUS

## 2019-04-27 NOTE — Telephone Encounter (Signed)
Scheduled per los. Called and left msg. Mailed printout  °

## 2019-04-28 ENCOUNTER — Other Ambulatory Visit: Payer: Self-pay | Admitting: Physician Assistant

## 2019-04-28 ENCOUNTER — Telehealth: Payer: Self-pay

## 2019-04-28 DIAGNOSIS — C3491 Malignant neoplasm of unspecified part of right bronchus or lung: Secondary | ICD-10-CM

## 2019-04-28 NOTE — Telephone Encounter (Signed)
TC from Pt's grand daughter Edwina Barth stating The Pt. Is feeling dizzy and has diarrhea to the point she had to buy depends. Pt also stated she was to receive a lab appointment q 3weeks with her treatment. Informed Pt's grand daughter Per Cassie H PA- C in increase the Pt's fluid intact and diarrhea is a side effect of the treatment. Pt's grand daughter also stated that she was on a laxative as well and is holding that for now. Informed grand daughter that lab appointments will be put in for Pt.

## 2019-05-01 ENCOUNTER — Telehealth: Payer: Self-pay | Admitting: Medical Oncology

## 2019-05-01 ENCOUNTER — Encounter: Payer: Self-pay | Admitting: Internal Medicine

## 2019-05-01 NOTE — Telephone Encounter (Signed)
Dtr LVM that pt needs appt for weekly labs per DR Mohamed's LOS.  LOS resent -hihg priority because pt needs weekly labs starting tomorrow.

## 2019-05-03 ENCOUNTER — Other Ambulatory Visit: Payer: Self-pay | Admitting: Physician Assistant

## 2019-05-03 ENCOUNTER — Inpatient Hospital Stay: Payer: Medicare Other | Attending: Internal Medicine

## 2019-05-03 ENCOUNTER — Inpatient Hospital Stay: Payer: Medicare Other

## 2019-05-03 ENCOUNTER — Other Ambulatory Visit: Payer: Self-pay

## 2019-05-03 DIAGNOSIS — R5383 Other fatigue: Secondary | ICD-10-CM | POA: Insufficient documentation

## 2019-05-03 DIAGNOSIS — Z5112 Encounter for antineoplastic immunotherapy: Secondary | ICD-10-CM | POA: Insufficient documentation

## 2019-05-03 DIAGNOSIS — Z7901 Long term (current) use of anticoagulants: Secondary | ICD-10-CM | POA: Diagnosis not present

## 2019-05-03 DIAGNOSIS — I10 Essential (primary) hypertension: Secondary | ICD-10-CM | POA: Insufficient documentation

## 2019-05-03 DIAGNOSIS — C3491 Malignant neoplasm of unspecified part of right bronchus or lung: Secondary | ICD-10-CM

## 2019-05-03 DIAGNOSIS — Z5111 Encounter for antineoplastic chemotherapy: Secondary | ICD-10-CM | POA: Diagnosis not present

## 2019-05-03 DIAGNOSIS — E039 Hypothyroidism, unspecified: Secondary | ICD-10-CM | POA: Diagnosis not present

## 2019-05-03 DIAGNOSIS — Z85820 Personal history of malignant melanoma of skin: Secondary | ICD-10-CM | POA: Diagnosis not present

## 2019-05-03 DIAGNOSIS — E785 Hyperlipidemia, unspecified: Secondary | ICD-10-CM | POA: Insufficient documentation

## 2019-05-03 DIAGNOSIS — Z79899 Other long term (current) drug therapy: Secondary | ICD-10-CM | POA: Diagnosis not present

## 2019-05-03 DIAGNOSIS — C3431 Malignant neoplasm of lower lobe, right bronchus or lung: Secondary | ICD-10-CM | POA: Diagnosis not present

## 2019-05-03 DIAGNOSIS — Z452 Encounter for adjustment and management of vascular access device: Secondary | ICD-10-CM | POA: Insufficient documentation

## 2019-05-03 DIAGNOSIS — L659 Nonscarring hair loss, unspecified: Secondary | ICD-10-CM | POA: Diagnosis not present

## 2019-05-03 DIAGNOSIS — Z95828 Presence of other vascular implants and grafts: Secondary | ICD-10-CM

## 2019-05-03 DIAGNOSIS — I825Z3 Chronic embolism and thrombosis of unspecified deep veins of distal lower extremity, bilateral: Secondary | ICD-10-CM

## 2019-05-03 LAB — CBC WITH DIFFERENTIAL (CANCER CENTER ONLY)
Abs Immature Granulocytes: 0.16 10*3/uL — ABNORMAL HIGH (ref 0.00–0.07)
Basophils Absolute: 0 10*3/uL (ref 0.0–0.1)
Basophils Relative: 1 %
Eosinophils Absolute: 0.1 10*3/uL (ref 0.0–0.5)
Eosinophils Relative: 2 %
HCT: 26.5 % — ABNORMAL LOW (ref 36.0–46.0)
Hemoglobin: 8.4 g/dL — ABNORMAL LOW (ref 12.0–15.0)
Immature Granulocytes: 5 %
Lymphocytes Relative: 37 %
Lymphs Abs: 1.1 10*3/uL (ref 0.7–4.0)
MCH: 28.8 pg (ref 26.0–34.0)
MCHC: 31.7 g/dL (ref 30.0–36.0)
MCV: 90.8 fL (ref 80.0–100.0)
Monocytes Absolute: 0.8 10*3/uL (ref 0.1–1.0)
Monocytes Relative: 25 %
Neutro Abs: 1 10*3/uL — ABNORMAL LOW (ref 1.7–7.7)
Neutrophils Relative %: 30 %
Platelet Count: 48 10*3/uL — ABNORMAL LOW (ref 150–400)
RBC: 2.92 MIL/uL — ABNORMAL LOW (ref 3.87–5.11)
RDW: 15.6 % — ABNORMAL HIGH (ref 11.5–15.5)
WBC Count: 3.2 10*3/uL — ABNORMAL LOW (ref 4.0–10.5)
nRBC: 0 % (ref 0.0–0.2)

## 2019-05-03 LAB — CMP (CANCER CENTER ONLY)
ALT: 20 U/L (ref 0–44)
AST: 24 U/L (ref 15–41)
Albumin: 3.2 g/dL — ABNORMAL LOW (ref 3.5–5.0)
Alkaline Phosphatase: 84 U/L (ref 38–126)
Anion gap: 10 (ref 5–15)
BUN: 25 mg/dL — ABNORMAL HIGH (ref 8–23)
CO2: 25 mmol/L (ref 22–32)
Calcium: 8.7 mg/dL — ABNORMAL LOW (ref 8.9–10.3)
Chloride: 107 mmol/L (ref 98–111)
Creatinine: 1.21 mg/dL — ABNORMAL HIGH (ref 0.44–1.00)
GFR, Est AFR Am: 48 mL/min — ABNORMAL LOW (ref >60)
GFR, Estimated: 41 mL/min — ABNORMAL LOW (ref >60)
Glucose, Bld: 91 mg/dL (ref 70–99)
Potassium: 4.1 mmol/L (ref 3.5–5.1)
Sodium: 142 mmol/L (ref 135–145)
Total Bilirubin: 0.3 mg/dL (ref 0.3–1.2)
Total Protein: 5.9 g/dL — ABNORMAL LOW (ref 6.5–8.1)

## 2019-05-03 LAB — TSH: TSH: 5.419 u[IU]/mL — ABNORMAL HIGH (ref 0.308–3.960)

## 2019-05-03 MED ORDER — HEPARIN SOD (PORK) LOCK FLUSH 100 UNIT/ML IV SOLN
500.0000 [IU] | Freq: Once | INTRAVENOUS | Status: AC
  Administered 2019-05-03: 14:00:00 500 [IU]
  Filled 2019-05-03: qty 5

## 2019-05-03 MED ORDER — SODIUM CHLORIDE 0.9% FLUSH
10.0000 mL | Freq: Once | INTRAVENOUS | Status: AC
  Administered 2019-05-03: 10 mL
  Filled 2019-05-03: qty 10

## 2019-05-09 ENCOUNTER — Inpatient Hospital Stay: Payer: Medicare Other

## 2019-05-09 ENCOUNTER — Other Ambulatory Visit: Payer: Self-pay

## 2019-05-09 DIAGNOSIS — Z95828 Presence of other vascular implants and grafts: Secondary | ICD-10-CM

## 2019-05-09 DIAGNOSIS — C3491 Malignant neoplasm of unspecified part of right bronchus or lung: Secondary | ICD-10-CM

## 2019-05-09 DIAGNOSIS — C3431 Malignant neoplasm of lower lobe, right bronchus or lung: Secondary | ICD-10-CM | POA: Diagnosis not present

## 2019-05-09 LAB — CMP (CANCER CENTER ONLY)
ALT: 12 U/L (ref 0–44)
AST: 19 U/L (ref 15–41)
Albumin: 3.1 g/dL — ABNORMAL LOW (ref 3.5–5.0)
Alkaline Phosphatase: 95 U/L (ref 38–126)
Anion gap: 9 (ref 5–15)
BUN: 19 mg/dL (ref 8–23)
CO2: 26 mmol/L (ref 22–32)
Calcium: 8.6 mg/dL — ABNORMAL LOW (ref 8.9–10.3)
Chloride: 110 mmol/L (ref 98–111)
Creatinine: 1.3 mg/dL — ABNORMAL HIGH (ref 0.44–1.00)
GFR, Est AFR Am: 44 mL/min — ABNORMAL LOW (ref >60)
GFR, Estimated: 38 mL/min — ABNORMAL LOW (ref >60)
Glucose, Bld: 87 mg/dL (ref 70–99)
Potassium: 4.2 mmol/L (ref 3.5–5.1)
Sodium: 145 mmol/L (ref 135–145)
Total Bilirubin: 0.3 mg/dL (ref 0.3–1.2)
Total Protein: 5.9 g/dL — ABNORMAL LOW (ref 6.5–8.1)

## 2019-05-09 LAB — CBC WITH DIFFERENTIAL (CANCER CENTER ONLY)
Abs Immature Granulocytes: 0.15 10*3/uL — ABNORMAL HIGH (ref 0.00–0.07)
Basophils Absolute: 0 10*3/uL (ref 0.0–0.1)
Basophils Relative: 0 %
Eosinophils Absolute: 0.1 10*3/uL (ref 0.0–0.5)
Eosinophils Relative: 1 %
HCT: 27.9 % — ABNORMAL LOW (ref 36.0–46.0)
Hemoglobin: 8.7 g/dL — ABNORMAL LOW (ref 12.0–15.0)
Immature Granulocytes: 2 %
Lymphocytes Relative: 20 %
Lymphs Abs: 1.3 10*3/uL (ref 0.7–4.0)
MCH: 29.5 pg (ref 26.0–34.0)
MCHC: 31.2 g/dL (ref 30.0–36.0)
MCV: 94.6 fL (ref 80.0–100.0)
Monocytes Absolute: 0.4 10*3/uL (ref 0.1–1.0)
Monocytes Relative: 6 %
Neutro Abs: 4.7 10*3/uL (ref 1.7–7.7)
Neutrophils Relative %: 71 %
Platelet Count: 76 10*3/uL — ABNORMAL LOW (ref 150–400)
RBC: 2.95 MIL/uL — ABNORMAL LOW (ref 3.87–5.11)
RDW: 18 % — ABNORMAL HIGH (ref 11.5–15.5)
WBC Count: 6.7 10*3/uL (ref 4.0–10.5)
nRBC: 0 % (ref 0.0–0.2)

## 2019-05-09 LAB — TSH: TSH: 4.326 u[IU]/mL — ABNORMAL HIGH (ref 0.308–3.960)

## 2019-05-09 MED ORDER — SODIUM CHLORIDE 0.9% FLUSH
10.0000 mL | Freq: Once | INTRAVENOUS | Status: AC
  Administered 2019-05-09: 10 mL
  Filled 2019-05-09: qty 10

## 2019-05-09 MED ORDER — HEPARIN SOD (PORK) LOCK FLUSH 100 UNIT/ML IV SOLN
500.0000 [IU] | Freq: Once | INTRAVENOUS | Status: AC
  Administered 2019-05-09: 500 [IU]
  Filled 2019-05-09: qty 5

## 2019-05-17 ENCOUNTER — Other Ambulatory Visit: Payer: Self-pay

## 2019-05-17 ENCOUNTER — Inpatient Hospital Stay: Payer: Medicare Other

## 2019-05-17 ENCOUNTER — Inpatient Hospital Stay: Payer: Medicare Other | Admitting: Internal Medicine

## 2019-05-17 ENCOUNTER — Encounter: Payer: Self-pay | Admitting: Internal Medicine

## 2019-05-17 DIAGNOSIS — E039 Hypothyroidism, unspecified: Secondary | ICD-10-CM | POA: Diagnosis not present

## 2019-05-17 DIAGNOSIS — Z5111 Encounter for antineoplastic chemotherapy: Secondary | ICD-10-CM | POA: Diagnosis not present

## 2019-05-17 DIAGNOSIS — C3491 Malignant neoplasm of unspecified part of right bronchus or lung: Secondary | ICD-10-CM

## 2019-05-17 DIAGNOSIS — R5383 Other fatigue: Secondary | ICD-10-CM | POA: Diagnosis not present

## 2019-05-17 DIAGNOSIS — Z452 Encounter for adjustment and management of vascular access device: Secondary | ICD-10-CM | POA: Diagnosis not present

## 2019-05-17 DIAGNOSIS — I10 Essential (primary) hypertension: Secondary | ICD-10-CM | POA: Diagnosis not present

## 2019-05-17 DIAGNOSIS — Z5112 Encounter for antineoplastic immunotherapy: Secondary | ICD-10-CM | POA: Diagnosis not present

## 2019-05-17 DIAGNOSIS — Z7901 Long term (current) use of anticoagulants: Secondary | ICD-10-CM | POA: Diagnosis not present

## 2019-05-17 DIAGNOSIS — Z79899 Other long term (current) drug therapy: Secondary | ICD-10-CM | POA: Diagnosis not present

## 2019-05-17 DIAGNOSIS — C3431 Malignant neoplasm of lower lobe, right bronchus or lung: Secondary | ICD-10-CM | POA: Diagnosis not present

## 2019-05-17 DIAGNOSIS — L659 Nonscarring hair loss, unspecified: Secondary | ICD-10-CM | POA: Diagnosis not present

## 2019-05-17 DIAGNOSIS — E785 Hyperlipidemia, unspecified: Secondary | ICD-10-CM | POA: Diagnosis not present

## 2019-05-17 DIAGNOSIS — C3490 Malignant neoplasm of unspecified part of unspecified bronchus or lung: Secondary | ICD-10-CM | POA: Diagnosis not present

## 2019-05-17 DIAGNOSIS — Z85820 Personal history of malignant melanoma of skin: Secondary | ICD-10-CM | POA: Diagnosis not present

## 2019-05-17 DIAGNOSIS — Z95828 Presence of other vascular implants and grafts: Secondary | ICD-10-CM

## 2019-05-17 LAB — CMP (CANCER CENTER ONLY)
ALT: 11 U/L (ref 0–44)
AST: 19 U/L (ref 15–41)
Albumin: 3.4 g/dL — ABNORMAL LOW (ref 3.5–5.0)
Alkaline Phosphatase: 78 U/L (ref 38–126)
Anion gap: 8 (ref 5–15)
BUN: 24 mg/dL — ABNORMAL HIGH (ref 8–23)
CO2: 26 mmol/L (ref 22–32)
Calcium: 9.1 mg/dL (ref 8.9–10.3)
Chloride: 109 mmol/L (ref 98–111)
Creatinine: 1.54 mg/dL — ABNORMAL HIGH (ref 0.44–1.00)
GFR, Est AFR Am: 36 mL/min — ABNORMAL LOW (ref >60)
GFR, Estimated: 31 mL/min — ABNORMAL LOW (ref >60)
Glucose, Bld: 110 mg/dL — ABNORMAL HIGH (ref 70–99)
Potassium: 4 mmol/L (ref 3.5–5.1)
Sodium: 143 mmol/L (ref 135–145)
Total Bilirubin: 0.3 mg/dL (ref 0.3–1.2)
Total Protein: 6.4 g/dL — ABNORMAL LOW (ref 6.5–8.1)

## 2019-05-17 LAB — CBC WITH DIFFERENTIAL (CANCER CENTER ONLY)
Abs Immature Granulocytes: 0.03 10*3/uL (ref 0.00–0.07)
Basophils Absolute: 0 10*3/uL (ref 0.0–0.1)
Basophils Relative: 1 %
Eosinophils Absolute: 0 10*3/uL (ref 0.0–0.5)
Eosinophils Relative: 0 %
HCT: 29.6 % — ABNORMAL LOW (ref 36.0–46.0)
Hemoglobin: 9.2 g/dL — ABNORMAL LOW (ref 12.0–15.0)
Immature Granulocytes: 1 %
Lymphocytes Relative: 21 %
Lymphs Abs: 1 10*3/uL (ref 0.7–4.0)
MCH: 29.1 pg (ref 26.0–34.0)
MCHC: 31.1 g/dL (ref 30.0–36.0)
MCV: 93.7 fL (ref 80.0–100.0)
Monocytes Absolute: 0.5 10*3/uL (ref 0.1–1.0)
Monocytes Relative: 9 %
Neutro Abs: 3.4 10*3/uL (ref 1.7–7.7)
Neutrophils Relative %: 68 %
Platelet Count: 127 10*3/uL — ABNORMAL LOW (ref 150–400)
RBC: 3.16 MIL/uL — ABNORMAL LOW (ref 3.87–5.11)
RDW: 19.1 % — ABNORMAL HIGH (ref 11.5–15.5)
WBC Count: 5 10*3/uL (ref 4.0–10.5)
nRBC: 0 % (ref 0.0–0.2)

## 2019-05-17 MED ORDER — FAMOTIDINE IN NACL 20-0.9 MG/50ML-% IV SOLN
INTRAVENOUS | Status: AC
  Filled 2019-05-17: qty 50

## 2019-05-17 MED ORDER — HEPARIN SOD (PORK) LOCK FLUSH 100 UNIT/ML IV SOLN
500.0000 [IU] | Freq: Once | INTRAVENOUS | Status: AC | PRN
  Administered 2019-05-17: 500 [IU]
  Filled 2019-05-17: qty 5

## 2019-05-17 MED ORDER — DEXAMETHASONE SODIUM PHOSPHATE 10 MG/ML IJ SOLN
INTRAMUSCULAR | Status: AC
  Filled 2019-05-17: qty 1

## 2019-05-17 MED ORDER — SODIUM CHLORIDE 0.9 % IV SOLN
283.0000 mg | Freq: Once | INTRAVENOUS | Status: AC
  Administered 2019-05-17: 280 mg via INTRAVENOUS
  Filled 2019-05-17: qty 28

## 2019-05-17 MED ORDER — DIPHENHYDRAMINE HCL 50 MG/ML IJ SOLN
INTRAMUSCULAR | Status: AC
  Filled 2019-05-17: qty 1

## 2019-05-17 MED ORDER — DEXAMETHASONE SODIUM PHOSPHATE 10 MG/ML IJ SOLN
10.0000 mg | Freq: Once | INTRAMUSCULAR | Status: AC
  Administered 2019-05-17: 10 mg via INTRAVENOUS

## 2019-05-17 MED ORDER — SODIUM CHLORIDE 0.9 % IV SOLN
Freq: Once | INTRAVENOUS | Status: AC
  Filled 2019-05-17: qty 250

## 2019-05-17 MED ORDER — SODIUM CHLORIDE 0.9 % IV SOLN
150.0000 mg | Freq: Once | INTRAVENOUS | Status: AC
  Administered 2019-05-17: 150 mg via INTRAVENOUS
  Filled 2019-05-17: qty 150

## 2019-05-17 MED ORDER — PALONOSETRON HCL INJECTION 0.25 MG/5ML
INTRAVENOUS | Status: AC
  Filled 2019-05-17: qty 5

## 2019-05-17 MED ORDER — SODIUM CHLORIDE 0.9% FLUSH
10.0000 mL | INTRAVENOUS | Status: DC | PRN
  Administered 2019-05-17: 10 mL
  Filled 2019-05-17: qty 10

## 2019-05-17 MED ORDER — SODIUM CHLORIDE 0.9 % IV SOLN
200.0000 mg | Freq: Once | INTRAVENOUS | Status: AC
  Administered 2019-05-17: 200 mg via INTRAVENOUS
  Filled 2019-05-17: qty 8

## 2019-05-17 MED ORDER — SODIUM CHLORIDE 0.9 % IV SOLN
175.0000 mg/m2 | Freq: Once | INTRAVENOUS | Status: AC
  Administered 2019-05-17: 318 mg via INTRAVENOUS
  Filled 2019-05-17: qty 53

## 2019-05-17 MED ORDER — FAMOTIDINE IN NACL 20-0.9 MG/50ML-% IV SOLN
20.0000 mg | Freq: Once | INTRAVENOUS | Status: AC
  Administered 2019-05-17: 20 mg via INTRAVENOUS

## 2019-05-17 MED ORDER — SODIUM CHLORIDE 0.9% FLUSH
10.0000 mL | Freq: Once | INTRAVENOUS | Status: AC
  Administered 2019-05-17: 10 mL
  Filled 2019-05-17: qty 10

## 2019-05-17 MED ORDER — PALONOSETRON HCL INJECTION 0.25 MG/5ML
0.2500 mg | Freq: Once | INTRAVENOUS | Status: AC
  Administered 2019-05-17: 0.25 mg via INTRAVENOUS

## 2019-05-17 MED ORDER — DIPHENHYDRAMINE HCL 50 MG/ML IJ SOLN
50.0000 mg | Freq: Once | INTRAMUSCULAR | Status: AC
  Administered 2019-05-17: 50 mg via INTRAVENOUS

## 2019-05-17 NOTE — Patient Instructions (Signed)
[facility] Discharge Instructions for Patients Receiving Chemotherapy  Today you received the following chemotherapy agents Keytruda, Taxol, and Carboplatin.   To help prevent nausea and vomiting after your treatment, we encourage you to take your nausea medication as directed.  If you develop nausea and vomiting that is not controlled by your nausea medication, call the clinic.   BELOW ARE SYMPTOMS THAT SHOULD BE REPORTED IMMEDIATELY:  *FEVER GREATER THAN 100.5 F  *CHILLS WITH OR WITHOUT FEVER  NAUSEA AND VOMITING THAT IS NOT CONTROLLED WITH YOUR NAUSEA MEDICATION  *UNUSUAL SHORTNESS OF BREATH  *UNUSUAL BRUISING OR BLEEDING  TENDERNESS IN MOUTH AND THROAT WITH OR WITHOUT PRESENCE OF ULCERS  *URINARY PROBLEMS  *BOWEL PROBLEMS  UNUSUAL RASH Items with * indicate a potential emergency and should be followed up as soon as possible.  Feel free to call the clinic should you have any questions or concerns. The clinic phone number is [phone].  Please show the [facility] at check-in to the Emergency Department and triage nurse.

## 2019-05-17 NOTE — Progress Notes (Signed)
[facility] [phone]   [phone]  OFFICE PROGRESS NOTE  [name], MD [place] [address]  DIAGNOSIS: Recurrent non-small cell lung cancer initially diagnosed stage IIIB (T3, N3, M0) non-small cell lung cancer, squamous cell carcinoma presented with large right middle lobe lung mass in addition to right hilar, subcarinal and right supraclavicular lymphadenopathy diagnosed in August 2019.  The patient had evidence for disease progression in December 2020.  PRIOR THERAPY:  1) Concurrent chemoradiation with weekly carboplatin for AUC of 2 and paclitaxel 45 NG/M2.  Status post 6 cycles with partial response. 2) Consolidation immunotherapy with Imfinzi 10 mg/KG every 2 weeks started February 28, 2018.  Status post 26 cycles.  Last cycle was given on February 21, 2019  CURRENT THERAPY: Systemic chemotherapy with carboplatin for AUC of 5, paclitaxel 175 mg/M2 and Keytruda 200 mg IV every 3 weeks.  First dose April 03, 2018.  Status post 2 cycles.  INTERVAL HISTORY: Lisa [name] 86 y.o. female returns to the clinic today for follow-up visit.  The patient is feeling fine today with no concerning complaints.  She shaved her head because of the alopecia with the chemotherapy.  She denied having any current chest pain, shortness of breath, cough or hemoptysis.  She denied having any fever or chills.  She has no nausea, vomiting, diarrhea or constipation.  She denied having any headache or visual changes.  She is here today for evaluation before starting cycle #3 of her chemotherapy.   MEDICAL HISTORY: Past Medical History:  Diagnosis Date  . Diverticulosis   . Headache   . Hemorrhoids    internal and external  . Hyperlipidemia 01/29/1991  . Hypertension 10/29/1995  . Hypothyroidism 10/29/1995  . Lung mass    right middle lobe  . Melanoma ([place])    h/o, local excision. no chemo or rady.   . Skin cancer (melanoma) ([place])    . Tubular adenoma of colon   . Wears dentures     ALLERGIES:  is allergic to atorvastatin; celebrex [celecoxib]; cholecalciferol; and simvastatin.  MEDICATIONS:  Current Outpatient Medications  Medication Sig Dispense Refill  . albuterol (VENTOLIN HFA) 108 (90 Base) MCG/ACT inhaler Inhale 2 puffs into the lungs every 6 (six) hours as needed for wheezing or shortness of breath. 6.7 g 0  . amLODipine (NORVASC) 10 MG tablet Take 1 tablet by mouth once daily 90 tablet 2  . Cholecalciferol (VITAMIN D) 2000 units tablet Take 2,000 Units by mouth 2 (two) times daily.    . Coenzyme Q10 100 MG capsule Take 100 mg by mouth daily.     Marland Kitchen docusate sodium (COLACE) 100 MG capsule Take 100 mg by mouth daily as needed for mild constipation.    . fluticasone (FLONASE) 50 MCG/ACT nasal spray USE 2 SPRAYS IN EACH NOSTRIL DAILY AS NEEDED FOR ALLERGIES 48 g 3  . levothyroxine (SYNTHROID, LEVOTHROID) 75 MCG tablet Take 1 tablet by mouth once daily 90 tablet 3  . lidocaine-prilocaine (EMLA) cream Apply 1 application topically as needed. 30 g 0  . loratadine (CLARITIN) 10 MG tablet Take 1 tablet (10 mg total) by mouth daily as needed for allergies or rhinitis.    . mirtazapine (REMERON) 7.5 MG tablet Take 1 tablet (7.5 mg total) by mouth at bedtime. 30 tablet 1  . prochlorperazine (COMPAZINE) 10 MG tablet Take 1 tablet (10 mg total) by mouth every 6 (six) hours as needed for nausea  or vomiting. (Patient not taking: Reported on 04/06/2019) 30 tablet 0  . sennosides-docusate sodium (SENOKOT-S) 8.6-50 MG tablet Take 1 tablet by mouth daily as needed for constipation.    [name] 20 MG TABS tablet TAKE 1 TABLET BY MOUTH ONCE DAILY WITH SUPPER 30 tablet 0   No current facility-administered medications for this visit.   Facility-Administered Medications Ordered in Other Visits  Medication Dose Route Frequency Provider Last Rate Last Admin  . acetaminophen (TYLENOL) tablet 650 mg  650 mg Oral Once Nicholas Lose, MD          SURGICAL HISTORY:  Past Surgical History:  Procedure Laterality Date  . BRONCHIAL BIOPSY  11/23/2017   Procedure: BRONCHIAL BIOPSIES;  Surgeon: Grace Isaac, MD;  Location: [facility] OR;  Service: Thoracic;;  . cataract surgery  2007, 2008   repair bilat  . DG BARIUM ENEMA ([place] HX) N/A 2016   [name]  . HERNIA REPAIR  04/25/2001  . IR IMAGING GUIDED PORT INSERTION  07/05/2018  . IR US GUIDE BX ASP/DRAIN  11/17/2017  . KNEE SURGERY     meniscus tear per Dr. Ronnie Derby, R knee  . MULTIPLE TOOTH EXTRACTIONS    . SEPTOPLASTY  2006   Dr. Ernesto Rutherford  . SKIN CANCER EXCISION    . TONSILLECTOMY  1954  . VAGINAL HYSTERECTOMY    . VIDEO BRONCHOSCOPY WITH ENDOBRONCHIAL ULTRASOUND N/A 11/23/2017   Procedure: VIDEO BRONCHOSCOPY WITH ENDOBRONCHIAL ULTRASOUND;  Surgeon: Grace Isaac, MD;  Location: [facility];  Service: Thoracic;  Laterality: N/A;    REVIEW OF SYSTEMS:  A comprehensive review of systems was negative except for: Constitutional: positive for fatigue   PHYSICAL EXAMINATION: General appearance: alert, cooperative, fatigued and no distress Head: Normocephalic, without obvious abnormality, atraumatic Neck: no adenopathy, no JVD, supple, symmetrical, trachea midline and thyroid not enlarged, symmetric, no tenderness/mass/nodules Lymph nodes: Cervical, supraclavicular, and axillary nodes normal. Resp: clear to auscultation bilaterally Back: symmetric, no curvature. ROM normal. No CVA tenderness. Cardio: regular rate and rhythm, S1, S2 normal, no murmur, click, rub or gallop GI: soft, non-tender; bowel sounds normal; no masses,  no organomegaly Extremities: extremities normal, atraumatic, no cyanosis or edema  ECOG PERFORMANCE STATUS: 1 - Symptomatic but completely ambulatory  Blood pressure 137/82, pulse 94, temperature 99.1 F (37.3 C), temperature source Oral, resp. rate 20, weight 149 lb 0.3 oz (67.6 kg), SpO2 100 %.  LABORATORY DATA: Lab Results  Component Value Date   WBC 5.0  05/17/2019   HGB 9.2 (L) 05/17/2019   HCT 29.6 (L) 05/17/2019   MCV 93.7 05/17/2019   PLT 127 (L) 05/17/2019      Chemistry      Component Value Date/Time   NA 145 05/09/2019 1320   K 4.2 05/09/2019 1320   CL 110 05/09/2019 1320   CO2 26 05/09/2019 1320   BUN 19 05/09/2019 1320   CREATININE 1.30 (H) 05/09/2019 1320      Component Value Date/Time   CALCIUM 8.6 (L) 05/09/2019 1320   ALKPHOS 95 05/09/2019 1320   AST 19 05/09/2019 1320   ALT 12 05/09/2019 1320   BILITOT 0.3 05/09/2019 1320       RADIOGRAPHIC STUDIES: No results found.  ASSESSMENT AND PLAN: This is a very pleasant 86 years old white female with stage IIIb non-small cell lung cancer, squamous cell carcinoma.  She is currently undergoing a course of concurrent chemoradiation with weekly carboplatin and paclitaxel status post 6 cycles.  She tolerated this treatment well with  partial response. The patient also completed the course of consolidation treatment with immunotherapy with Imfinzi status post 26 cycles.  She tolerated her previous treatment well. Her recent CT scan of the chest showed progressive consolidation in the right lung suspicious for disease recurrence but also worsening post radiation effect could not be excluded.  A PET scan showed rounded hypermetabolic soft tissue mass inferior to the right hilum consistent with lung cancer recurrence.  There was also hypermetabolic precarinal lymph node concerning for nodal metastasis but no evidence for metastatic disease in the abdomen or pelvis.  The patient also had groundglass density in the right upper lobe, right middle lobe as well as left lower lobe suspicious for inflammatory process and less likely malignancy. The patient started treatment with systemic chemotherapy with carboplatin for AUC of 5, paclitaxel 175 mg/M2 and Keytruda 200 mg IV every 3 weeks with Neulasta support status post 2 cycles.  The patient is tolerating this treatment well with no  concerning adverse effects. I recommended for her to proceed with cycle #3 today as planned. I will see her back for follow-up visit in 3 weeks for evaluation with repeat CT scan of the chest for restaging of her disease. The patient was advised to call immediately if she has any concerning symptoms in the interval. The patient voices understanding of current disease status and treatment options and is in agreement with the current care plan. All questions were answered. The patient knows to call the clinic with any problems, questions or concerns. We can certainly see the patient much sooner if necessary.  Disclaimer: This note was dictated with voice recognition software. Similar sounding words can inadvertently be transcribed and may not be corrected upon review.

## 2019-05-17 NOTE — Progress Notes (Signed)
Per Dr Julien Nordmann OK for treatment today with CRT of 1.54

## 2019-05-19 ENCOUNTER — Other Ambulatory Visit: Payer: Self-pay

## 2019-05-19 ENCOUNTER — Telehealth: Payer: Self-pay | Admitting: Internal Medicine

## 2019-05-19 ENCOUNTER — Inpatient Hospital Stay: Payer: Medicare Other

## 2019-05-19 VITALS — BP 118/62 | HR 88 | Temp 98.2°F | Resp 18

## 2019-05-19 DIAGNOSIS — C3431 Malignant neoplasm of lower lobe, right bronchus or lung: Secondary | ICD-10-CM | POA: Diagnosis not present

## 2019-05-19 DIAGNOSIS — Z5111 Encounter for antineoplastic chemotherapy: Secondary | ICD-10-CM | POA: Diagnosis not present

## 2019-05-19 DIAGNOSIS — E785 Hyperlipidemia, unspecified: Secondary | ICD-10-CM | POA: Diagnosis not present

## 2019-05-19 DIAGNOSIS — Z7901 Long term (current) use of anticoagulants: Secondary | ICD-10-CM | POA: Diagnosis not present

## 2019-05-19 DIAGNOSIS — L659 Nonscarring hair loss, unspecified: Secondary | ICD-10-CM | POA: Diagnosis not present

## 2019-05-19 DIAGNOSIS — Z85820 Personal history of malignant melanoma of skin: Secondary | ICD-10-CM | POA: Diagnosis not present

## 2019-05-19 DIAGNOSIS — Z452 Encounter for adjustment and management of vascular access device: Secondary | ICD-10-CM | POA: Diagnosis not present

## 2019-05-19 DIAGNOSIS — Z5112 Encounter for antineoplastic immunotherapy: Secondary | ICD-10-CM | POA: Diagnosis not present

## 2019-05-19 DIAGNOSIS — E039 Hypothyroidism, unspecified: Secondary | ICD-10-CM | POA: Diagnosis not present

## 2019-05-19 DIAGNOSIS — R5383 Other fatigue: Secondary | ICD-10-CM | POA: Diagnosis not present

## 2019-05-19 DIAGNOSIS — Z79899 Other long term (current) drug therapy: Secondary | ICD-10-CM | POA: Diagnosis not present

## 2019-05-19 DIAGNOSIS — C3491 Malignant neoplasm of unspecified part of right bronchus or lung: Secondary | ICD-10-CM

## 2019-05-19 DIAGNOSIS — I10 Essential (primary) hypertension: Secondary | ICD-10-CM | POA: Diagnosis not present

## 2019-05-19 MED ORDER — PEGFILGRASTIM-JMDB 6 MG/0.6ML ~~LOC~~ SOSY
6.0000 mg | PREFILLED_SYRINGE | Freq: Once | SUBCUTANEOUS | Status: AC
  Administered 2019-05-19: 6 mg via SUBCUTANEOUS

## 2019-05-19 MED ORDER — PEGFILGRASTIM-JMDB 6 MG/0.6ML ~~LOC~~ SOSY
PREFILLED_SYRINGE | SUBCUTANEOUS | Status: AC
  Filled 2019-05-19: qty 0.6

## 2019-05-19 NOTE — Patient Instructions (Signed)

## 2019-05-19 NOTE — Telephone Encounter (Signed)
Scheduled per los. Called and left msg about added appts. Mailed printout

## 2019-05-22 ENCOUNTER — Encounter: Payer: Self-pay | Admitting: Internal Medicine

## 2019-05-23 ENCOUNTER — Telehealth: Payer: Self-pay | Admitting: Internal Medicine

## 2019-05-23 NOTE — Telephone Encounter (Signed)
Scheduled appt per 2/23 sch message - pt aware of appt added

## 2019-05-25 ENCOUNTER — Inpatient Hospital Stay: Payer: Medicare Other

## 2019-05-25 ENCOUNTER — Other Ambulatory Visit: Payer: Self-pay

## 2019-05-25 DIAGNOSIS — Z95828 Presence of other vascular implants and grafts: Secondary | ICD-10-CM

## 2019-05-25 DIAGNOSIS — C3431 Malignant neoplasm of lower lobe, right bronchus or lung: Secondary | ICD-10-CM | POA: Diagnosis not present

## 2019-05-25 DIAGNOSIS — C3491 Malignant neoplasm of unspecified part of right bronchus or lung: Secondary | ICD-10-CM

## 2019-05-25 LAB — CMP (CANCER CENTER ONLY)
ALT: 17 U/L (ref 0–44)
AST: 21 U/L (ref 15–41)
Albumin: 3.1 g/dL — ABNORMAL LOW (ref 3.5–5.0)
Alkaline Phosphatase: 80 U/L (ref 38–126)
Anion gap: 8 (ref 5–15)
BUN: 23 mg/dL (ref 8–23)
CO2: 25 mmol/L (ref 22–32)
Calcium: 8.6 mg/dL — ABNORMAL LOW (ref 8.9–10.3)
Chloride: 108 mmol/L (ref 98–111)
Creatinine: 1.38 mg/dL — ABNORMAL HIGH (ref 0.44–1.00)
GFR, Est AFR Am: 41 mL/min — ABNORMAL LOW (ref >60)
GFR, Estimated: 35 mL/min — ABNORMAL LOW (ref >60)
Glucose, Bld: 119 mg/dL — ABNORMAL HIGH (ref 70–99)
Potassium: 4.2 mmol/L (ref 3.5–5.1)
Sodium: 141 mmol/L (ref 135–145)
Total Bilirubin: 0.4 mg/dL (ref 0.3–1.2)
Total Protein: 5.8 g/dL — ABNORMAL LOW (ref 6.5–8.1)

## 2019-05-25 LAB — CBC WITH DIFFERENTIAL (CANCER CENTER ONLY)
Abs Immature Granulocytes: 0.17 10*3/uL — ABNORMAL HIGH (ref 0.00–0.07)
Basophils Absolute: 0 10*3/uL (ref 0.0–0.1)
Basophils Relative: 1 %
Eosinophils Absolute: 0 10*3/uL (ref 0.0–0.5)
Eosinophils Relative: 1 %
HCT: 25.1 % — ABNORMAL LOW (ref 36.0–46.0)
Hemoglobin: 7.8 g/dL — ABNORMAL LOW (ref 12.0–15.0)
Immature Granulocytes: 5 %
Lymphocytes Relative: 40 %
Lymphs Abs: 1.5 10*3/uL (ref 0.7–4.0)
MCH: 30.1 pg (ref 26.0–34.0)
MCHC: 31.1 g/dL (ref 30.0–36.0)
MCV: 96.9 fL (ref 80.0–100.0)
Monocytes Absolute: 0.8 10*3/uL (ref 0.1–1.0)
Monocytes Relative: 21 %
Neutro Abs: 1.2 10*3/uL — ABNORMAL LOW (ref 1.7–7.7)
Neutrophils Relative %: 32 %
Platelet Count: 56 10*3/uL — ABNORMAL LOW (ref 150–400)
RBC: 2.59 MIL/uL — ABNORMAL LOW (ref 3.87–5.11)
RDW: 18.8 % — ABNORMAL HIGH (ref 11.5–15.5)
WBC Count: 3.7 10*3/uL — ABNORMAL LOW (ref 4.0–10.5)
nRBC: 0 % (ref 0.0–0.2)

## 2019-05-25 MED ORDER — SODIUM CHLORIDE 0.9% FLUSH
10.0000 mL | Freq: Once | INTRAVENOUS | Status: AC
  Administered 2019-05-25: 10 mL
  Filled 2019-05-25: qty 10

## 2019-05-25 MED ORDER — HEPARIN SOD (PORK) LOCK FLUSH 100 UNIT/ML IV SOLN
500.0000 [IU] | Freq: Once | INTRAVENOUS | Status: AC
  Administered 2019-05-25: 500 [IU]
  Filled 2019-05-25: qty 5

## 2019-05-26 ENCOUNTER — Other Ambulatory Visit: Payer: Self-pay | Admitting: Medical Oncology

## 2019-05-26 DIAGNOSIS — D649 Anemia, unspecified: Secondary | ICD-10-CM

## 2019-05-26 LAB — TSH: TSH: 4.916 u[IU]/mL — ABNORMAL HIGH (ref 0.308–3.960)

## 2019-05-26 NOTE — Progress Notes (Signed)
Schedule message sent for 2 units of blood on Monday . Orders entered. Pt notified to go to ED over the weekend if she has worsening fatigue, lightheaded , dizzy.

## 2019-05-29 ENCOUNTER — Inpatient Hospital Stay: Payer: Medicare Other

## 2019-05-29 ENCOUNTER — Telehealth: Payer: Self-pay | Admitting: Internal Medicine

## 2019-05-29 ENCOUNTER — Encounter: Payer: Self-pay | Admitting: Internal Medicine

## 2019-05-29 ENCOUNTER — Other Ambulatory Visit: Payer: Self-pay

## 2019-05-29 ENCOUNTER — Inpatient Hospital Stay: Payer: Medicare Other | Attending: Internal Medicine

## 2019-05-29 DIAGNOSIS — E785 Hyperlipidemia, unspecified: Secondary | ICD-10-CM | POA: Insufficient documentation

## 2019-05-29 DIAGNOSIS — S72002A Fracture of unspecified part of neck of left femur, initial encounter for closed fracture: Secondary | ICD-10-CM | POA: Diagnosis not present

## 2019-05-29 DIAGNOSIS — Z5112 Encounter for antineoplastic immunotherapy: Secondary | ICD-10-CM | POA: Insufficient documentation

## 2019-05-29 DIAGNOSIS — Z79899 Other long term (current) drug therapy: Secondary | ICD-10-CM | POA: Insufficient documentation

## 2019-05-29 DIAGNOSIS — E039 Hypothyroidism, unspecified: Secondary | ICD-10-CM | POA: Insufficient documentation

## 2019-05-29 DIAGNOSIS — S72042A Displaced fracture of base of neck of left femur, initial encounter for closed fracture: Secondary | ICD-10-CM | POA: Diagnosis not present

## 2019-05-29 DIAGNOSIS — Z5111 Encounter for antineoplastic chemotherapy: Secondary | ICD-10-CM | POA: Insufficient documentation

## 2019-05-29 DIAGNOSIS — C3431 Malignant neoplasm of lower lobe, right bronchus or lung: Secondary | ICD-10-CM | POA: Insufficient documentation

## 2019-05-29 DIAGNOSIS — S299XXA Unspecified injury of thorax, initial encounter: Secondary | ICD-10-CM | POA: Diagnosis not present

## 2019-05-29 DIAGNOSIS — Z7901 Long term (current) use of anticoagulants: Secondary | ICD-10-CM | POA: Insufficient documentation

## 2019-05-29 DIAGNOSIS — D649 Anemia, unspecified: Secondary | ICD-10-CM

## 2019-05-29 DIAGNOSIS — I10 Essential (primary) hypertension: Secondary | ICD-10-CM | POA: Insufficient documentation

## 2019-05-29 DIAGNOSIS — Z03818 Encounter for observation for suspected exposure to other biological agents ruled out: Secondary | ICD-10-CM | POA: Diagnosis not present

## 2019-05-29 DIAGNOSIS — R5383 Other fatigue: Secondary | ICD-10-CM | POA: Insufficient documentation

## 2019-05-29 DIAGNOSIS — C3491 Malignant neoplasm of unspecified part of right bronchus or lung: Secondary | ICD-10-CM

## 2019-05-29 DIAGNOSIS — Z85820 Personal history of malignant melanoma of skin: Secondary | ICD-10-CM | POA: Insufficient documentation

## 2019-05-29 DIAGNOSIS — S72032A Displaced midcervical fracture of left femur, initial encounter for closed fracture: Secondary | ICD-10-CM | POA: Diagnosis not present

## 2019-05-29 LAB — CMP (CANCER CENTER ONLY)
ALT: 11 U/L (ref 0–44)
AST: 20 U/L (ref 15–41)
Albumin: 3.1 g/dL — ABNORMAL LOW (ref 3.5–5.0)
Alkaline Phosphatase: 88 U/L (ref 38–126)
Anion gap: 5 (ref 5–15)
BUN: 19 mg/dL (ref 8–23)
CO2: 28 mmol/L (ref 22–32)
Calcium: 8.7 mg/dL — ABNORMAL LOW (ref 8.9–10.3)
Chloride: 109 mmol/L (ref 98–111)
Creatinine: 1.31 mg/dL — ABNORMAL HIGH (ref 0.44–1.00)
GFR, Est AFR Am: 43 mL/min — ABNORMAL LOW (ref >60)
GFR, Estimated: 37 mL/min — ABNORMAL LOW (ref >60)
Glucose, Bld: 99 mg/dL (ref 70–99)
Potassium: 4.3 mmol/L (ref 3.5–5.1)
Sodium: 142 mmol/L (ref 135–145)
Total Bilirubin: 0.3 mg/dL (ref 0.3–1.2)
Total Protein: 5.9 g/dL — ABNORMAL LOW (ref 6.5–8.1)

## 2019-05-29 LAB — CBC WITH DIFFERENTIAL (CANCER CENTER ONLY)
Abs Immature Granulocytes: 0.34 10*3/uL — ABNORMAL HIGH (ref 0.00–0.07)
Basophils Absolute: 0.1 10*3/uL (ref 0.0–0.1)
Basophils Relative: 1 %
Eosinophils Absolute: 0 10*3/uL (ref 0.0–0.5)
Eosinophils Relative: 0 %
HCT: 24.5 % — ABNORMAL LOW (ref 36.0–46.0)
Hemoglobin: 7.5 g/dL — ABNORMAL LOW (ref 12.0–15.0)
Immature Granulocytes: 5 %
Lymphocytes Relative: 20 %
Lymphs Abs: 1.4 10*3/uL (ref 0.7–4.0)
MCH: 29.4 pg (ref 26.0–34.0)
MCHC: 30.6 g/dL (ref 30.0–36.0)
MCV: 96.1 fL (ref 80.0–100.0)
Monocytes Absolute: 0.5 10*3/uL (ref 0.1–1.0)
Monocytes Relative: 7 %
Neutro Abs: 4.9 10*3/uL (ref 1.7–7.7)
Neutrophils Relative %: 67 %
Platelet Count: 66 10*3/uL — ABNORMAL LOW (ref 150–400)
RBC: 2.55 MIL/uL — ABNORMAL LOW (ref 3.87–5.11)
RDW: 20.2 % — ABNORMAL HIGH (ref 11.5–15.5)
WBC Count: 7.3 10*3/uL (ref 4.0–10.5)
nRBC: 0.3 % — ABNORMAL HIGH (ref 0.0–0.2)

## 2019-05-29 LAB — PREPARE RBC (CROSSMATCH)

## 2019-05-29 LAB — TSH: TSH: 2.738 u[IU]/mL (ref 0.308–3.960)

## 2019-05-29 MED ORDER — ACETAMINOPHEN 325 MG PO TABS
ORAL_TABLET | ORAL | Status: AC
  Filled 2019-05-29: qty 2

## 2019-05-29 MED ORDER — SODIUM CHLORIDE 0.9% FLUSH
10.0000 mL | INTRAVENOUS | Status: AC | PRN
  Administered 2019-05-29: 10 mL
  Filled 2019-05-29: qty 10

## 2019-05-29 MED ORDER — ACETAMINOPHEN 325 MG PO TABS
650.0000 mg | ORAL_TABLET | Freq: Once | ORAL | Status: AC
  Administered 2019-05-29: 650 mg via ORAL

## 2019-05-29 MED ORDER — DIPHENHYDRAMINE HCL 25 MG PO CAPS
25.0000 mg | ORAL_CAPSULE | Freq: Once | ORAL | Status: AC
  Administered 2019-05-29: 25 mg via ORAL

## 2019-05-29 MED ORDER — HEPARIN SOD (PORK) LOCK FLUSH 100 UNIT/ML IV SOLN
500.0000 [IU] | Freq: Every day | INTRAVENOUS | Status: AC | PRN
  Administered 2019-05-29: 500 [IU]
  Filled 2019-05-29: qty 5

## 2019-05-29 MED ORDER — SODIUM CHLORIDE 0.9% IV SOLUTION
250.0000 mL | Freq: Once | INTRAVENOUS | Status: AC
  Administered 2019-05-29: 250 mL via INTRAVENOUS
  Filled 2019-05-29: qty 250

## 2019-05-29 MED ORDER — DIPHENHYDRAMINE HCL 25 MG PO CAPS
ORAL_CAPSULE | ORAL | Status: AC
  Filled 2019-05-29: qty 1

## 2019-05-29 NOTE — Telephone Encounter (Signed)
Scheduled appt per 2/26 sch message - pt is aware of appt date and time   

## 2019-05-29 NOTE — Patient Instructions (Signed)

## 2019-05-30 ENCOUNTER — Other Ambulatory Visit: Payer: Self-pay

## 2019-05-30 ENCOUNTER — Encounter: Payer: Self-pay | Admitting: Medical Oncology

## 2019-05-30 ENCOUNTER — Emergency Department (HOSPITAL_COMMUNITY): Payer: Medicare Other

## 2019-05-30 ENCOUNTER — Encounter (HOSPITAL_COMMUNITY): Payer: Self-pay

## 2019-05-30 ENCOUNTER — Encounter: Payer: Self-pay | Admitting: Internal Medicine

## 2019-05-30 ENCOUNTER — Inpatient Hospital Stay (HOSPITAL_COMMUNITY)
Admission: EM | Admit: 2019-05-30 | Discharge: 2019-06-03 | DRG: 521 | Disposition: A | Discharge status: Skilled Nursing Facility | Payer: Medicare Other | Attending: Internal Medicine | Admitting: Internal Medicine

## 2019-05-30 DIAGNOSIS — Z20822 Contact with and (suspected) exposure to covid-19: Secondary | ICD-10-CM | POA: Diagnosis not present

## 2019-05-30 DIAGNOSIS — Z888 Allergy status to other drugs, medicaments and biological substances status: Secondary | ICD-10-CM

## 2019-05-30 DIAGNOSIS — E785 Hyperlipidemia, unspecified: Secondary | ICD-10-CM | POA: Diagnosis not present

## 2019-05-30 DIAGNOSIS — S72002A Fracture of unspecified part of neck of left femur, initial encounter for closed fracture: Secondary | ICD-10-CM

## 2019-05-30 DIAGNOSIS — Z03818 Encounter for observation for suspected exposure to other biological agents ruled out: Secondary | ICD-10-CM | POA: Diagnosis not present

## 2019-05-30 DIAGNOSIS — Z79890 Hormone replacement therapy (postmenopausal): Secondary | ICD-10-CM | POA: Diagnosis not present

## 2019-05-30 DIAGNOSIS — Z86718 Personal history of other venous thrombosis and embolism: Secondary | ICD-10-CM

## 2019-05-30 DIAGNOSIS — S72032A Displaced midcervical fracture of left femur, initial encounter for closed fracture: Secondary | ICD-10-CM | POA: Diagnosis not present

## 2019-05-30 DIAGNOSIS — F05 Delirium due to known physiological condition: Secondary | ICD-10-CM | POA: Diagnosis not present

## 2019-05-30 DIAGNOSIS — I10 Essential (primary) hypertension: Secondary | ICD-10-CM | POA: Diagnosis not present

## 2019-05-30 DIAGNOSIS — Z823 Family history of stroke: Secondary | ICD-10-CM | POA: Diagnosis not present

## 2019-05-30 DIAGNOSIS — I129 Hypertensive chronic kidney disease with stage 1 through stage 4 chronic kidney disease, or unspecified chronic kidney disease: Secondary | ICD-10-CM | POA: Diagnosis not present

## 2019-05-30 DIAGNOSIS — R2681 Unsteadiness on feet: Secondary | ICD-10-CM | POA: Diagnosis not present

## 2019-05-30 DIAGNOSIS — E039 Hypothyroidism, unspecified: Secondary | ICD-10-CM | POA: Diagnosis present

## 2019-05-30 DIAGNOSIS — Z85820 Personal history of malignant melanoma of skin: Secondary | ICD-10-CM | POA: Diagnosis not present

## 2019-05-30 DIAGNOSIS — Y92511 Restaurant or cafe as the place of occurrence of the external cause: Secondary | ICD-10-CM

## 2019-05-30 DIAGNOSIS — D61810 Antineoplastic chemotherapy induced pancytopenia: Secondary | ICD-10-CM | POA: Diagnosis not present

## 2019-05-30 DIAGNOSIS — Z7901 Long term (current) use of anticoagulants: Secondary | ICD-10-CM | POA: Diagnosis not present

## 2019-05-30 DIAGNOSIS — W010XXA Fall on same level from slipping, tripping and stumbling without subsequent striking against object, initial encounter: Secondary | ICD-10-CM | POA: Diagnosis present

## 2019-05-30 DIAGNOSIS — Z87891 Personal history of nicotine dependence: Secondary | ICD-10-CM | POA: Diagnosis not present

## 2019-05-30 DIAGNOSIS — Z800 Family history of malignant neoplasm of digestive organs: Secondary | ICD-10-CM | POA: Diagnosis not present

## 2019-05-30 DIAGNOSIS — T451X5A Adverse effect of antineoplastic and immunosuppressive drugs, initial encounter: Secondary | ICD-10-CM | POA: Diagnosis present

## 2019-05-30 DIAGNOSIS — I82409 Acute embolism and thrombosis of unspecified deep veins of unspecified lower extremity: Secondary | ICD-10-CM | POA: Diagnosis present

## 2019-05-30 DIAGNOSIS — S299XXA Unspecified injury of thorax, initial encounter: Secondary | ICD-10-CM | POA: Diagnosis not present

## 2019-05-30 DIAGNOSIS — M2550 Pain in unspecified joint: Secondary | ICD-10-CM | POA: Diagnosis not present

## 2019-05-30 DIAGNOSIS — M6281 Muscle weakness (generalized): Secondary | ICD-10-CM | POA: Diagnosis not present

## 2019-05-30 DIAGNOSIS — Z09 Encounter for follow-up examination after completed treatment for conditions other than malignant neoplasm: Secondary | ICD-10-CM

## 2019-05-30 DIAGNOSIS — Z96642 Presence of left artificial hip joint: Secondary | ICD-10-CM | POA: Diagnosis not present

## 2019-05-30 DIAGNOSIS — R2689 Other abnormalities of gait and mobility: Secondary | ICD-10-CM | POA: Diagnosis not present

## 2019-05-30 DIAGNOSIS — Z8249 Family history of ischemic heart disease and other diseases of the circulatory system: Secondary | ICD-10-CM

## 2019-05-30 DIAGNOSIS — Z7401 Bed confinement status: Secondary | ICD-10-CM | POA: Diagnosis not present

## 2019-05-30 DIAGNOSIS — Z471 Aftercare following joint replacement surgery: Secondary | ICD-10-CM | POA: Diagnosis not present

## 2019-05-30 DIAGNOSIS — W19XXXA Unspecified fall, initial encounter: Secondary | ICD-10-CM | POA: Diagnosis not present

## 2019-05-30 DIAGNOSIS — M8580 Other specified disorders of bone density and structure, unspecified site: Secondary | ICD-10-CM | POA: Diagnosis not present

## 2019-05-30 DIAGNOSIS — Z79899 Other long term (current) drug therapy: Secondary | ICD-10-CM

## 2019-05-30 DIAGNOSIS — R52 Pain, unspecified: Secondary | ICD-10-CM | POA: Diagnosis not present

## 2019-05-30 DIAGNOSIS — R41841 Cognitive communication deficit: Secondary | ICD-10-CM | POA: Diagnosis not present

## 2019-05-30 DIAGNOSIS — S72002D Fracture of unspecified part of neck of left femur, subsequent encounter for closed fracture with routine healing: Secondary | ICD-10-CM | POA: Diagnosis not present

## 2019-05-30 DIAGNOSIS — I825Z3 Chronic embolism and thrombosis of unspecified deep veins of distal lower extremity, bilateral: Secondary | ICD-10-CM | POA: Diagnosis not present

## 2019-05-30 DIAGNOSIS — K5790 Diverticulosis of intestine, part unspecified, without perforation or abscess without bleeding: Secondary | ICD-10-CM | POA: Diagnosis present

## 2019-05-30 DIAGNOSIS — N183 Chronic kidney disease, stage 3 unspecified: Secondary | ICD-10-CM | POA: Diagnosis not present

## 2019-05-30 DIAGNOSIS — R402411 Glasgow coma scale score 13-15, in the field [EMT or ambulance]: Secondary | ICD-10-CM | POA: Diagnosis not present

## 2019-05-30 DIAGNOSIS — C3491 Malignant neoplasm of unspecified part of right bronchus or lung: Secondary | ICD-10-CM | POA: Diagnosis not present

## 2019-05-30 DIAGNOSIS — Z20828 Contact with and (suspected) exposure to other viral communicable diseases: Secondary | ICD-10-CM | POA: Diagnosis not present

## 2019-05-30 DIAGNOSIS — S72042A Displaced fracture of base of neck of left femur, initial encounter for closed fracture: Secondary | ICD-10-CM | POA: Diagnosis not present

## 2019-05-30 LAB — TYPE AND SCREEN
ABO/RH(D): O NEG
Antibody Screen: NEGATIVE
Unit division: 0
Unit division: 0

## 2019-05-30 LAB — BPAM RBC
Blood Product Expiration Date: 202103192359
Blood Product Expiration Date: 202103192359
ISSUE DATE / TIME: 202103011450
ISSUE DATE / TIME: 202103011649
Unit Type and Rh: 9500
Unit Type and Rh: 9500

## 2019-05-30 LAB — CBC WITH DIFFERENTIAL/PLATELET
Abs Immature Granulocytes: 0 10*3/uL (ref 0.00–0.07)
Band Neutrophils: 22 %
Basophils Absolute: 0 10*3/uL (ref 0.0–0.1)
Basophils Relative: 0 %
Eosinophils Absolute: 0 10*3/uL (ref 0.0–0.5)
Eosinophils Relative: 0 %
HCT: 32.3 % — ABNORMAL LOW (ref 36.0–46.0)
Hemoglobin: 10.1 g/dL — ABNORMAL LOW (ref 12.0–15.0)
Lymphocytes Relative: 12 %
Lymphs Abs: 1 10*3/uL (ref 0.7–4.0)
MCH: 29.8 pg (ref 26.0–34.0)
MCHC: 31.3 g/dL (ref 30.0–36.0)
MCV: 95.3 fL (ref 80.0–100.0)
Monocytes Absolute: 0.2 10*3/uL (ref 0.1–1.0)
Monocytes Relative: 2 %
Neutro Abs: 7 10*3/uL (ref 1.7–7.7)
Neutrophils Relative %: 64 %
Platelets: 65 10*3/uL — ABNORMAL LOW (ref 150–400)
RBC: 3.39 MIL/uL — ABNORMAL LOW (ref 3.87–5.11)
RDW: 19.9 % — ABNORMAL HIGH (ref 11.5–15.5)
WBC Morphology: INCREASED
WBC: 8.1 10*3/uL (ref 4.0–10.5)
nRBC: 0 % (ref 0.0–0.2)

## 2019-05-30 LAB — BASIC METABOLIC PANEL
Anion gap: 8 (ref 5–15)
BUN: 19 mg/dL (ref 8–23)
CO2: 26 mmol/L (ref 22–32)
Calcium: 8.6 mg/dL — ABNORMAL LOW (ref 8.9–10.3)
Chloride: 107 mmol/L (ref 98–111)
Creatinine, Ser: 1.49 mg/dL — ABNORMAL HIGH (ref 0.44–1.00)
GFR calc Af Amer: 37 mL/min — ABNORMAL LOW (ref >60)
GFR calc non Af Amer: 32 mL/min — ABNORMAL LOW (ref >60)
Glucose, Bld: 102 mg/dL — ABNORMAL HIGH (ref 70–99)
Potassium: 3.6 mmol/L (ref 3.5–5.1)
Sodium: 141 mmol/L (ref 135–145)

## 2019-05-30 MED ORDER — MIRTAZAPINE 15 MG PO TABS
7.5000 mg | ORAL_TABLET | Freq: Every day | ORAL | Status: DC
  Administered 2019-05-31 – 2019-06-02 (×4): 7.5 mg via ORAL
  Filled 2019-05-30 (×5): qty 1

## 2019-05-30 MED ORDER — AMLODIPINE BESYLATE 10 MG PO TABS
10.0000 mg | ORAL_TABLET | Freq: Every day | ORAL | Status: DC
  Administered 2019-05-31 – 2019-06-02 (×4): 10 mg via ORAL
  Filled 2019-05-30 (×4): qty 1

## 2019-05-30 MED ORDER — MORPHINE SULFATE (PF) 4 MG/ML IV SOLN
4.0000 mg | Freq: Once | INTRAVENOUS | Status: AC
  Administered 2019-05-30: 4 mg via INTRAVENOUS
  Filled 2019-05-30: qty 1

## 2019-05-30 MED ORDER — LEVOTHYROXINE SODIUM 75 MCG PO TABS
75.0000 ug | ORAL_TABLET | Freq: Every day | ORAL | Status: DC
  Administered 2019-05-31 – 2019-06-02 (×4): 75 ug via ORAL
  Filled 2019-05-30 (×4): qty 1

## 2019-05-30 MED ORDER — HYDROCODONE-ACETAMINOPHEN 5-325 MG PO TABS
1.0000 | ORAL_TABLET | Freq: Four times a day (QID) | ORAL | Status: DC | PRN
  Administered 2019-05-31 (×2): 2 via ORAL
  Filled 2019-05-30 (×2): qty 2

## 2019-05-30 MED ORDER — ALBUTEROL SULFATE (2.5 MG/3ML) 0.083% IN NEBU: 3.0000 mL | INHALATION_SOLUTION | Freq: Four times a day (QID) | RESPIRATORY_TRACT | Status: DC | PRN

## 2019-05-30 MED ORDER — DOCUSATE SODIUM 100 MG PO CAPS: 100.0000 mg | ORAL_CAPSULE | Freq: Every day | ORAL | Status: DC | PRN

## 2019-05-30 MED ORDER — MORPHINE SULFATE (PF) 2 MG/ML IV SOLN
0.5000 mg | INTRAVENOUS | Status: DC | PRN
  Administered 2019-05-31: 0.5 mg via INTRAVENOUS
  Filled 2019-05-30: qty 1

## 2019-05-30 NOTE — ED Notes (Signed)
All patient belongings were given to her grandaughter. Grandaughter was advised of visiting hours/policy for upstairs.

## 2019-05-30 NOTE — ED Notes (Signed)
Pt. is not on O2 at home.  Nurse currently put pt. on 2L of oxgen due to O2 stats slightly dropping after Morphine administration.

## 2019-05-30 NOTE — Progress Notes (Signed)
Reviewed Xrays of patient. Discussed case with Granddaughter Janet Berlin, HCPOA and one of our CRNA's at [facility].   In brief patient has history of cancer and recently received transfusion. She fell and has a left femoral neck fracture.   Would recommend admission to the hospitalist especially in the setting of this medically complex patient. Family is unsure of when she took her last dose of Eliquis but Especially in light of  her recent transfusion she will likely need to wait until Thursday he safe to go for the surgery unless hospitalist feels that she may be appropriate sooner.  Would plan for left hip hemi arthroplasty. We will see the patient for formal consultation tomorrow.

## 2019-05-30 NOTE — H&P (Signed)
History and Physical    HAILLIE RADU JKK:[id number] DOB: 21-Dec-1932 DOA: 05/30/2019  PCP: [name], MD  Patient coming from: Home.  Chief Complaint: Fall.  HPI: CARLYN LEMKE is a 86 y.o. female with history of stage III lung cancer, hypertension had a fall at a restaurant when patient was walking and tripped on the chair.  Denies hitting head.  Had pain of the left hip area and was brought to the ER.  Patient is being treated for lung cancer with chemo and had received 1 unit of PRBC yesterday for anemia.  Patient denies any chest pain or shortness of breath.  ED Course: In the ER x-rays revealed left hip fracture on-call orthopedic surgeon Dr. Griffin Basil has been consulted.  Since patient takes Xarelto for DVT plan is to have surgery on June 01, 2019.  Labs show creatinine 1.4 hemoglobin 10.1 platelets 65.  Chest x-ray shows chronic changes.  EKG shows normal sinus rhythm.  Covid test was negative.  Review of Systems: As per HPI, rest all negative.   Past Medical History:  Diagnosis Date  . Diverticulosis   . Headache   . Hemorrhoids    internal and external  . Hyperlipidemia 01/29/1991  . Hypertension 10/29/1995  . Hypothyroidism 10/29/1995  . Lung mass    right middle lobe  . Melanoma ([place])    h/o, local excision. no chemo or rady.   . Skin cancer (melanoma) ([place])   . Tubular adenoma of colon   . Wears dentures     Past Surgical History:  Procedure Laterality Date  . BRONCHIAL BIOPSY  11/23/2017   Procedure: BRONCHIAL BIOPSIES;  Surgeon: Grace Isaac, MD;  Location[facility] OR;  Service: Thoracic;;  . cataract surgery  2007, 2008   repair bilat  . DG BARIUM ENEMA ([place] HX) N/A 2016   [name]  . HERNIA REPAIR  04/25/2001  . IR IMAGING GUIDED PORT INSERTION  07/05/2018  . IR US GUIDE BX ASP/DRAIN  11/17/2017  . KNEE SURGERY     meniscus tear per Dr. Ronnie Derby, R knee  . MULTIPLE TOOTH EXTRACTIONS    . SEPTOPLASTY  2006   Dr. Ernesto Rutherford  . SKIN CANCER EXCISION      . TONSILLECTOMY  1954  . VAGINAL HYSTERECTOMY    . VIDEO BRONCHOSCOPY WITH ENDOBRONCHIAL ULTRASOUND N/A 11/23/2017   Procedure: VIDEO BRONCHOSCOPY WITH ENDOBRONCHIAL ULTRASOUND;  Surgeon: Grace Isaac, MD;  Location: [facility];  Service: Thoracic;  Laterality: N/A;     reports that she quit smoking about 30 years ago. Her smoking use included cigarettes. She started smoking about 60 years ago. She has a 30.00 pack-year smoking history. She has never used smokeless tobacco. She reports that she does not drink alcohol or use drugs.  Allergies  Allergen Reactions  . Atorvastatin Other (See Comments)    CAUSED BURNING AND TINGLING IN LEGS  . Celebrex [Celecoxib] Swelling and Rash    SWELLING REACTION UNSPECIFIED   . Cholecalciferol Anxiety and Other (See Comments)    Nervous, crying with high dose replacement  . Simvastatin Other (See Comments)    Muscle pain  . Tape Other (See Comments)    SKIN IS SENSITIVE!!    Family History  Problem Relation Age of Onset  . Stroke Mother   . Heart disease Mother        CHF  . Stomach cancer Sister   . Cancer Brother        Metastatic cancer  .  Colon cancer Neg Hx   . Breast cancer Neg Hx     Prior to Admission medications   Medication Sig Start Date End Date Taking? Authorizing Provider  albuterol (VENTOLIN HFA) 108 (90 Base) MCG/ACT inhaler Inhale 2 puffs into the lungs every 6 (six) hours as needed for wheezing or shortness of breath. 04/06/19  Yes Heilingoetter, Cassandra L, PA-C  amLODipine (NORVASC) 10 MG tablet Take 1 tablet by mouth once daily Patient taking differently: Take 10 mg by mouth at bedtime.  01/13/19  Yes [name], MD  Cholecalciferol (VITAMIN D3) 50 MCG (2000 UT) TABS Take 2,000 Units by mouth 3 (three) times a week.   Yes [provider]  Coenzyme Q10 100 MG capsule Take 100 mg by mouth daily.    Yes [provider]  fluticasone (FLONASE) 50 MCG/ACT nasal spray USE 2 SPRAYS IN EACH NOSTRIL DAILY  AS NEEDED FOR ALLERGIES Patient taking differently: Place 2 sprays into both nostrils daily as needed for allergies or rhinitis.  08/02/18  Yes [name], MD  levothyroxine (SYNTHROID, LEVOTHROID) 75 MCG tablet Take 1 tablet by mouth once daily Patient taking differently: Take 75 mcg by mouth at bedtime.  06/30/18  Yes [name], MD  lidocaine-prilocaine (EMLA) cream Apply 1 application topically as needed. Patient taking differently: Apply 1 application topically as needed (to numb).  04/06/19  Yes Heilingoetter, Cassandra L, PA-C  loratadine (CLARITIN) 10 MG tablet Take 1 tablet (10 mg total) by mouth daily as needed for allergies or rhinitis. 08/02/18  Yes [name], MD  mirtazapine (REMERON) 7.5 MG tablet Take 1 tablet (7.5 mg total) by mouth at bedtime. 04/06/19  Yes Heilingoetter, Cassandra L, PA-C  XARELTO 20 MG TABS tablet TAKE 1 TABLET BY MOUTH ONCE DAILY WITH SUPPER Patient taking differently: Take 20 mg by mouth daily after supper.  05/03/19  Yes Heilingoetter, Cassandra L, PA-C  docusate sodium (COLACE) 100 MG capsule Take 100 mg by mouth daily as needed for mild constipation.    [provider]  prochlorperazine (COMPAZINE) 10 MG tablet Take 1 tablet (10 mg total) by mouth every 6 (six) hours as needed for nausea or vomiting. Patient not taking: Reported on 05/30/2019 03/27/19   Curt Bears, MD  sennosides-docusate sodium (SENOKOT-S) 8.6-50 MG tablet Take 1 tablet by mouth daily as needed for constipation.    [provider]  mirtazapine (REMERON) 7.5 MG tablet TAKE 1 TABLET BY MOUTH ONCE DAILY AT BEDTIME 11/28/18   Curt Bears, MD  XARELTO 20 MG TABS tablet TAKE 1 TABLET BY MOUTH ONCE DAILY WITH SUPPER 01/02/19   Heilingoetter, Cassandra L, PA-C    Physical Exam: Constitutional: Moderately built and nourished. Vitals:   05/30/19 1913 05/30/19 2100 05/30/19 2115 05/30/19 2121  BP: 130/76 (!) 134/92 (!) 148/87   Pulse: 89 86 89 86  Resp: 17 (!) 23  16 18   Temp: 97.7 F (36.5 C)     TempSrc: Oral     SpO2: 97% (!) 88% 93% 95%   Eyes: Anicteric no pallor. ENMT: No discharge from the ears eyes nose or mouth. Neck: No masses.  No neck rigidity. Respiratory: No rhonchi or crepitations. Cardiovascular: S1-S2 heard. Abdomen: Soft nontender bowel sounds present. Musculoskeletal: Pain on moving left hip. Skin: No rash. Neurologic: Alert awake oriented to time place and person.  Moves all extremities. Psychiatric: Appears normal per normal affect.   Labs on Admission: I have personally reviewed following labs and imaging studies  CBC:  Recent Labs  Lab 05/25/19 1424 05/29/19 1258 05/30/19 1930  WBC 3.7* 7.3 8.1  NEUTROABS 1.2* 4.9 7.0  HGB 7.8* 7.5* 10.1*  HCT 25.1* 24.5* 32.3*  MCV 96.9 96.1 95.3  PLT 56* 66* 65*   Basic Metabolic Panel: Recent Labs  Lab 05/25/19 1424 05/29/19 1258 05/30/19 1930  NA 141 142 141  K 4.2 4.3 3.6  CL 108 109 107  CO2 25 28 26   GLUCOSE 119* 99 102*  BUN 23 19 19   CREATININE 1.38* 1.31* 1.49*  CALCIUM 8.6* 8.7* 8.6*   GFR: Estimated Creatinine Clearance: 28.4 mL/min (A) (by C-G formula based on SCr of 1.49 mg/dL (H)). Liver Function Tests: Recent Labs  Lab 05/25/19 1424 05/29/19 1258  AST 21 20  ALT 17 11  ALKPHOS 80 88  BILITOT 0.4 0.3  PROT 5.8* 5.9*  ALBUMIN 3.1* 3.1*   No results for input(s): LIPASE, AMYLASE in the last 168 hours. No results for input(s): AMMONIA in the last 168 hours. Coagulation Profile: No results for input(s): INR, PROTIME in the last 168 hours. Cardiac Enzymes: No results for input(s): CKTOTAL, CKMB, CKMBINDEX, TROPONINI in the last 168 hours. BNP (last 3 results) No results for input(s): PROBNP in the last 8760 hours. HbA1C: No results for input(s): HGBA1C in the last 72 hours. CBG: No results for input(s): GLUCAP in the last 168 hours. Lipid Profile: No results for input(s): CHOL, HDL, LDLCALC, TRIG, CHOLHDL, LDLDIRECT in the last 72  hours. Thyroid Function Tests: Recent Labs    05/29/19 1258  TSH 2.738   Anemia Panel: No results for input(s): VITAMINB12, FOLATE, FERRITIN, TIBC, IRON, RETICCTPCT in the last 72 hours. Urine analysis:    Component Value Date/Time   BILIRUBINUR neg 03/19/2015 1159   PROTEINUR neg 03/19/2015 1159   UROBILINOGEN negative 03/19/2015 1159   NITRITE neg 03/19/2015 1159   LEUKOCYTESUR small (1+) (A) 03/19/2015 1159   Sepsis Labs: @LABRCNTIP (procalcitonin:4,lacticidven:4) )No results found for this or any previous visit (from the past 240 hour(s)).   Radiological Exams on Admission: DG Chest 1 View  Result Date: 05/30/2019 CLINICAL DATA:  Fall, no chest complaints EXAM: CHEST  1 VIEW COMPARISON:  CT 03/07/2019, PET-CT 03/21/2019 FINDINGS: Large right infrahilar soft tissue attenuation compatible with previously seen FDG avid tissue, most compatible with recurrent lung malignancy. Obscuration of the right hemidiaphragm may reflect chronic right effusion seen on comparison studies. Additional atelectatic changes in the right lung base are likely present as well. Some scarring reticular changes is present in the left lung base as well, similar to comparison cross-sectional imaging. No new consolidative opacity is seen in the lungs. Cardiomediastinal contours are unremarkable for portable technique with a calcified, tortuous aorta. No acute osseous or soft tissue abnormality. Degenerative changes are present in the imaged spine and shoulders. Right IJ approach Port-A-Cath tip terminates at the level of the right atrium. IMPRESSION: Right perihilar attenuation compatible with recurrent malignancy seen on comparison PET-CT. Lung volume loss and chronic right effusion in the right lung base are likely similar to prior. No new acute cardiopulmonary abnormality or traumatic findings in the chest. Electronically Signed   By: Lovena Le M.D.   On: 05/30/2019 20:00   DG Hip Unilat With Pelvis 2-3 Views  Left  Result Date: 05/30/2019 CLINICAL DATA:  Mechanical fall, left hip pain EXAM: DG HIP (WITH OR WITHOUT PELVIS) 2-3V LEFT COMPARISON:  PET-CT 03/21/2019 FINDINGS: Mildly foreshortened transcervical left femoral neck fracture with associated left hip effusion. Left femoral head remains  normally located. Remaining bones of the pelvis are intact and congruent. Bilateral hip arthrosis as well as additional degenerative changes in the lower lumbar spine, SI joints and symphysis pubis are noted. Stable likely bone island seen in the posterior right acetabulum. Vascular calcium noted in the pelvis. Remaining soft tissues are unremarkable. IMPRESSION: Mildly foreshortened transcervical left femoral neck fracture with associated left hip effusion. Electronically Signed   By: Lovena Le M.D.   On: 05/30/2019 19:57    EKG: Independently reviewed.  Normal sinus rhythm.  Assessment/Plan Active Problems:   Hypothyroidism   HLD (hyperlipidemia)   Essential hypertension   Stage III squamous cell carcinoma of right lung (HCC)   DVT (deep venous thrombosis) (HCC)   Non-small cell carcinoma of lung, stage 4, right (HCC)   Closed fracture of left hip (HCC)    1. Left hip fracture status post mechanical fall.  Plan is to have surgery on June 01, 2019 since patient take Xarelto.  Presently Xarelto on hold.  Pain medications. 2. Hypertension on amlodipine. 3. Chronic kidney disease stage III creatinine appears to be at baseline follow CBC. 4. Previous history of DVT on Xarelto which is on hold for surgery. 5. History of stage III lung cancer on chemo being followed by Dr. Julien Nordmann. 6. Anemia and thrombocytopenia likely related to chemotherapy follow CBC.  Received 1 unit of PRBC yesterday.  Given that patient has hip fracture with multiple comorbidities will need close monitoring for any further worsening in inpatient status.   DVT prophylaxis: SCDs.  Patient also has thrombocytopenia and anticipation of  surgery will avoid anticoagulation. Code Status: Full code as confirmed with patient's granddaughter Ms. [name] who is also a Immunologist at [employer]. Family Communication: Patient's healthcare power of attorney Ms. [name]. Disposition Plan: To be determined. Consults called: Orthopedics. Admission status: Inpatient.   Rise Patience MD [facility] Pager [phone].  If 7PM-7AM, please contact night-coverage [email or web address] Password [facility]  05/30/2019, 10:26 PM

## 2019-05-30 NOTE — ED Triage Notes (Signed)
Pt BIB [facility] from home with a mechanical fall; pt fell after tripping over a chair in a restaurant. No obvious deformities and pt had pain on L. Hip 10/10. EMS gave 150 mcg of Fentanyl IV and pain is now 0/10.  A&Ox4 and EMS VSS.

## 2019-05-30 NOTE — ED Notes (Signed)
Admitting provider at bedside.

## 2019-05-30 NOTE — ED Provider Notes (Signed)
[facility] EMERGENCY DEPARTMENT Provider Note   CSN: [id number] Arrival date & time: 05/30/19  1817     History Chief Complaint  Patient presents with   Lisa [name] is a 86 y.o. female with a past medical history of melanoma, hyperlipidemia, hyper tension, hypothyroid, stage IV non-small cell lung cancer of the right side, DVT anticoagulated with Xarelto, who presents today for evaluation after a fall. She reports that she was leaving dinner when she caught her foot on a chair at the restaurant causing her to fall.  She denies striking her head or passing out. She reports that she landed on her left hip.  Initially her pain was 10 out of 10 however EMS gave her 150 MCG's of fentanyl.  She denies any pain in chest, abdomen, neck, arms and right leg.    She does not think she took her xarelto last night.   HPI     Past Medical History:  Diagnosis Date   Diverticulosis    Headache    Hemorrhoids    internal and external   Hyperlipidemia 01/29/1991   Hypertension 10/29/1995   Hypothyroidism 10/29/1995   Lung mass    right middle lobe   Melanoma ([place])    h/o, local excision. no chemo or rady.    Skin cancer (melanoma) ([place])    Tubular adenoma of colon    Wears dentures     Patient Active Problem List   Diagnosis Date Noted   Closed left hip fracture, initial encounter ([place]) 05/30/2019   Non-small cell carcinoma of lung, stage 4, right ([place]) 03/27/2019   Port-A-Cath in place 09/21/2018   Sore throat 08/03/2018   Healthcare maintenance 07/27/2018   Hypercalcemia 04/28/2018   DVT (deep venous thrombosis) ([place]) 04/28/2018   Dehydration 04/28/2018   Hypokalemia 04/28/2018   Encounter for antineoplastic immunotherapy 02/16/2018   Stage III squamous cell carcinoma of right lung ([place]) 12/02/2017   Encounter for antineoplastic chemotherapy 12/02/2017   Goals of care, counseling/discussion 12/02/2017   Vitamin D  deficiency 06/19/2016   Elevated serum creatinine 06/19/2016   Advance care planning 06/08/2014   Medicare annual wellness visit, subsequent 05/19/2012   Dysuria 11/26/2010   Bilateral leg edema 11/26/2010   OBESITY 12/15/2006   Hypothyroidism 10/29/1995   Essential hypertension 10/29/1995   HLD (hyperlipidemia) 01/29/1991    Past Surgical History:  Procedure Laterality Date   BRONCHIAL BIOPSY  11/23/2017   Procedure: BRONCHIAL BIOPSIES;  Surgeon: Grace Isaac, MD;  Location[facility];  Service: Thoracic;;   cataract surgery  2007, 2008   repair bilat   DG BARIUM ENEMA ([place] HX) N/A 2016   [place]   HERNIA REPAIR  04/25/2001   IR IMAGING GUIDED PORT INSERTION  07/05/2018   IR US GUIDE BX ASP/DRAIN  11/17/2017   KNEE SURGERY     meniscus tear per Dr. Ronnie Derby, R knee   MULTIPLE TOOTH EXTRACTIONS     SEPTOPLASTY  2006   Dr. Ernesto Rutherford   SKIN CANCER EXCISION     TONSILLECTOMY  1954   VAGINAL HYSTERECTOMY     VIDEO BRONCHOSCOPY WITH ENDOBRONCHIAL ULTRASOUND N/A 11/23/2017   Procedure: VIDEO BRONCHOSCOPY WITH ENDOBRONCHIAL ULTRASOUND;  Surgeon: Grace Isaac, MD;  Location: [facility] OR;  Service: Thoracic;  Laterality: N/A;     OB History   No obstetric history on file.     Family History  Problem Relation Age of Onset   Stroke Mother  Heart disease Mother        CHF   Stomach cancer Sister    Cancer Brother        Metastatic cancer   Colon cancer Neg Hx    Breast cancer Neg Hx     Social History   Tobacco Use   Smoking status: Former Smoker    Packs/day: 1.00    Years: 30.00    Pack years: 30.00    Types: Cigarettes    Start date: 10/28/1958    Quit date: 10/27/1988    Years since quitting: 30.6   Smokeless tobacco: Never Used   Tobacco comment: quit 1994  Substance Use Topics   Alcohol use: No   Drug use: No    Home Medications Prior to Admission medications   Medication Sig Start Date End Date Taking? Authorizing  Provider  albuterol (VENTOLIN HFA) 108 (90 Base) MCG/ACT inhaler Inhale 2 puffs into the lungs every 6 (six) hours as needed for wheezing or shortness of breath. 04/06/19   Heilingoetter, Cassandra L, PA-C  amLODipine (NORVASC) 10 MG tablet Take 1 tablet by mouth once daily 01/13/19   [name], MD  Cholecalciferol (VITAMIN D) 2000 units tablet Take 2,000 Units by mouth 2 (two) times daily.    [provider]  Coenzyme Q10 100 MG capsule Take 100 mg by mouth daily.     [provider]  docusate sodium (COLACE) 100 MG capsule Take 100 mg by mouth daily as needed for mild constipation.    [provider]  fluticasone (FLONASE) 50 MCG/ACT nasal spray USE 2 SPRAYS IN EACH NOSTRIL DAILY AS NEEDED FOR ALLERGIES 08/02/18   [name], MD  levothyroxine Wilmer Floor, LEVOTHROID) 75 MCG tablet Take 1 tablet by mouth once daily 06/30/18   [name], MD  lidocaine-prilocaine (EMLA) cream Apply 1 application topically as needed. 04/06/19   Heilingoetter, Cassandra L, PA-C  loratadine (CLARITIN) 10 MG tablet Take 1 tablet (10 mg total) by mouth daily as needed for allergies or rhinitis. 08/02/18   [name], MD  mirtazapine (REMERON) 7.5 MG tablet Take 1 tablet (7.5 mg total) by mouth at bedtime. 04/06/19   Heilingoetter, Cassandra L, PA-C  prochlorperazine (COMPAZINE) 10 MG tablet Take 1 tablet (10 mg total) by mouth every 6 (six) hours as needed for nausea or vomiting. Patient not taking: Reported on 04/06/2019 03/27/19   Curt Bears, MD  sennosides-docusate sodium (SENOKOT-S) 8.6-50 MG tablet Take 1 tablet by mouth daily as needed for constipation.    [provider]  XARELTO 20 MG TABS tablet TAKE 1 TABLET BY MOUTH ONCE DAILY WITH SUPPER 05/03/19   Heilingoetter, Cassandra L, PA-C  mirtazapine (REMERON) 7.5 MG tablet TAKE 1 TABLET BY MOUTH ONCE DAILY AT BEDTIME 11/28/18   Curt Bears, MD  XARELTO 20 MG TABS tablet TAKE 1 TABLET BY MOUTH ONCE DAILY WITH  SUPPER 01/02/19   Heilingoetter, Cassandra L, PA-C    Allergies    Atorvastatin, Celebrex [celecoxib], Cholecalciferol, and Simvastatin  Review of Systems   Review of Systems  Constitutional: Negative for chills and fever.  HENT: Negative for congestion.   Respiratory: Negative for chest tightness and shortness of breath.   Musculoskeletal: Negative for back pain, neck pain and neck stiffness.  All other systems reviewed and are negative.   Physical Exam Updated Vital Signs BP 130/76 (BP Location: Right Arm)    Pulse 86    Temp 97.7 F (36.5 C) (Oral)    Resp  18    SpO2 95%   Physical Exam  ED Results / Procedures / Treatments   Labs (all labs ordered are listed, but only abnormal results are displayed) Labs Reviewed  BASIC METABOLIC PANEL - Abnormal; Notable for the following components:      Result Value   Glucose, Bld 102 (*)    Creatinine, Ser 1.49 (*)    Calcium 8.6 (*)    GFR calc non Af Amer 32 (*)    GFR calc Af Amer 37 (*)    All other components within normal limits  CBC WITH DIFFERENTIAL/PLATELET - Abnormal; Notable for the following components:   RBC 3.39 (*)    Hemoglobin 10.1 (*)    HCT 32.3 (*)    RDW 19.9 (*)    Platelets 65 (*)    All other components within normal limits  SARS CORONAVIRUS 2 (TAT 6-24 HRS)  TYPE AND SCREEN    EKG None  Radiology DG Chest 1 View  Result Date: 05/30/2019 CLINICAL DATA:  Fall, no chest complaints EXAM: CHEST  1 VIEW COMPARISON:  CT 03/07/2019, PET-CT 03/21/2019 FINDINGS: Large right infrahilar soft tissue attenuation compatible with previously seen FDG avid tissue, most compatible with recurrent lung malignancy. Obscuration of the right hemidiaphragm may reflect chronic right effusion seen on comparison studies. Additional atelectatic changes in the right lung base are likely present as well. Some scarring reticular changes is present in the left lung base as well, similar to comparison cross-sectional imaging. No new  consolidative opacity is seen in the lungs. Cardiomediastinal contours are unremarkable for portable technique with a calcified, tortuous aorta. No acute osseous or soft tissue abnormality. Degenerative changes are present in the imaged spine and shoulders. Right IJ approach Port-A-Cath tip terminates at the level of the right atrium. IMPRESSION: Right perihilar attenuation compatible with recurrent malignancy seen on comparison PET-CT. Lung volume loss and chronic right effusion in the right lung base are likely similar to prior. No new acute cardiopulmonary abnormality or traumatic findings in the chest. Electronically Signed   By: Lovena Le M.D.   On: 05/30/2019 20:00   DG Hip Unilat With Pelvis 2-3 Views Left  Result Date: 05/30/2019 CLINICAL DATA:  Mechanical fall, left hip pain EXAM: DG HIP (WITH OR WITHOUT PELVIS) 2-3V LEFT COMPARISON:  PET-CT 03/21/2019 FINDINGS: Mildly foreshortened transcervical left femoral neck fracture with associated left hip effusion. Left femoral head remains normally located. Remaining bones of the pelvis are intact and congruent. Bilateral hip arthrosis as well as additional degenerative changes in the lower lumbar spine, SI joints and symphysis pubis are noted. Stable likely bone island seen in the posterior right acetabulum. Vascular calcium noted in the pelvis. Remaining soft tissues are unremarkable. IMPRESSION: Mildly foreshortened transcervical left femoral neck fracture with associated left hip effusion. Electronically Signed   By: Lovena Le M.D.   On: 05/30/2019 19:57    Procedures Procedures (including critical care time)  Medications Ordered in ED Medications  morphine 4 MG/ML injection 4 mg (4 mg Intravenous Given 05/30/19 2121)    ED Course  I have reviewed the triage vital signs and the nursing notes.  Pertinent labs & imaging results that were available during my care of the patient were reviewed by me and considered in my medical decision making  (see chart for details).    MDM Rules/Calculators/A&P                     Patient presents today for evaluation  of mechanical, nonsyncopal fall.  She denies striking her head or passing out.  No concern for head or neck injury per patient.  She denies any injuries other than her left hip.   Primary area of pain is her left hip.  X-rays were obtained showing a transcervical left femoral neck fracture with left hip effusion.  Patient is anticoagulated with Xarelto, has not taken a dose today, questionable if she took her dose last night.  She has mild anemia with a hemoglobin of 10 and thrombocytopenia with platelets of 65 however, she did have a transfusion recently which has improved her hemoglobin up from 7.5 yesterday.  Covid testing is sent.  Type and screen ordered. I spoke with Dr. Griffin Basil who recommends n.p.o. at midnight. I spoke with Dr. Elmer Picker who will see the patient for admission.  In addition I spoke with [name], patient's granddaughter, who is a Immunologist for [employer] and is also patient's healthcare power of attorney.  This patient was seen as a shared visit with Dr. Tyrone Nine.  Patient remained hemodynamically stable while in my care.  Note: Portions of this report may have been transcribed using voice recognition software. Every effort was made to ensure accuracy; however, inadvertent computerized transcription errors may be present  Final Clinical Impression(s) / ED Diagnoses Final diagnoses:  Closed fracture of left hip, initial encounter [facility])  Fall, initial encounter    Rx / DC Orders ED Discharge Orders    None       Ollen Gross 05/30/19 2135    Deno Etienne, DO 05/30/19 2227

## 2019-05-31 ENCOUNTER — Inpatient Hospital Stay (HOSPITAL_COMMUNITY): Payer: Medicare Other

## 2019-05-31 ENCOUNTER — Inpatient Hospital Stay (HOSPITAL_COMMUNITY): Payer: Medicare Other | Admitting: Certified Registered Nurse Anesthetist

## 2019-05-31 ENCOUNTER — Encounter (HOSPITAL_COMMUNITY): Payer: Self-pay | Admitting: Internal Medicine

## 2019-05-31 ENCOUNTER — Telehealth: Payer: Self-pay | Admitting: Medical Oncology

## 2019-05-31 ENCOUNTER — Encounter (HOSPITAL_COMMUNITY)
Admission: EM | Disposition: A | Discharge status: Skilled Nursing Facility | Payer: Self-pay | Source: Home / Self Care | Attending: Internal Medicine

## 2019-05-31 DIAGNOSIS — E039 Hypothyroidism, unspecified: Secondary | ICD-10-CM

## 2019-05-31 DIAGNOSIS — I10 Essential (primary) hypertension: Secondary | ICD-10-CM

## 2019-05-31 DIAGNOSIS — I825Z3 Chronic embolism and thrombosis of unspecified deep veins of distal lower extremity, bilateral: Secondary | ICD-10-CM

## 2019-05-31 DIAGNOSIS — W19XXXA Unspecified fall, initial encounter: Secondary | ICD-10-CM

## 2019-05-31 DIAGNOSIS — S72002A Fracture of unspecified part of neck of left femur, initial encounter for closed fracture: Secondary | ICD-10-CM

## 2019-05-31 HISTORY — PX: HIP ARTHROPLASTY: SHX981

## 2019-05-31 LAB — CBC WITH DIFFERENTIAL/PLATELET
Abs Immature Granulocytes: 0.31 10*3/uL — ABNORMAL HIGH (ref 0.00–0.07)
Basophils Absolute: 0.1 10*3/uL (ref 0.0–0.1)
Basophils Relative: 1 %
Eosinophils Absolute: 0.1 10*3/uL (ref 0.0–0.5)
Eosinophils Relative: 1 %
HCT: 32.3 % — ABNORMAL LOW (ref 36.0–46.0)
Hemoglobin: 10 g/dL — ABNORMAL LOW (ref 12.0–15.0)
Immature Granulocytes: 4 %
Lymphocytes Relative: 15 %
Lymphs Abs: 1.1 10*3/uL (ref 0.7–4.0)
MCH: 29.4 pg (ref 26.0–34.0)
MCHC: 31 g/dL (ref 30.0–36.0)
MCV: 95 fL (ref 80.0–100.0)
Monocytes Absolute: 0.5 10*3/uL (ref 0.1–1.0)
Monocytes Relative: 6 %
Neutro Abs: 5.4 10*3/uL (ref 1.7–7.7)
Neutrophils Relative %: 73 %
Platelets: 61 10*3/uL — ABNORMAL LOW (ref 150–400)
RBC: 3.4 MIL/uL — ABNORMAL LOW (ref 3.87–5.11)
RDW: 20 % — ABNORMAL HIGH (ref 11.5–15.5)
WBC: 7.3 10*3/uL (ref 4.0–10.5)
nRBC: 0 % (ref 0.0–0.2)

## 2019-05-31 LAB — POCT I-STAT, CHEM 8
BUN: 17 mg/dL (ref 8–23)
Calcium, Ion: 1.2 mmol/L (ref 1.15–1.40)
Chloride: 105 mmol/L (ref 98–111)
Creatinine, Ser: 1.1 mg/dL — ABNORMAL HIGH (ref 0.44–1.00)
Glucose, Bld: 91 mg/dL (ref 70–99)
HCT: 26 % — ABNORMAL LOW (ref 36.0–46.0)
Hemoglobin: 8.8 g/dL — ABNORMAL LOW (ref 12.0–15.0)
Potassium: 4 mmol/L (ref 3.5–5.1)
Sodium: 141 mmol/L (ref 135–145)
TCO2: 25 mmol/L (ref 22–32)

## 2019-05-31 LAB — COMPREHENSIVE METABOLIC PANEL
ALT: 16 U/L (ref 0–44)
AST: 27 U/L (ref 15–41)
Albumin: 2.9 g/dL — ABNORMAL LOW (ref 3.5–5.0)
Alkaline Phosphatase: 88 U/L (ref 38–126)
Anion gap: 8 (ref 5–15)
BUN: 19 mg/dL (ref 8–23)
CO2: 28 mmol/L (ref 22–32)
Calcium: 8.8 mg/dL — ABNORMAL LOW (ref 8.9–10.3)
Chloride: 104 mmol/L (ref 98–111)
Creatinine, Ser: 1.34 mg/dL — ABNORMAL HIGH (ref 0.44–1.00)
GFR calc Af Amer: 42 mL/min — ABNORMAL LOW (ref >60)
GFR calc non Af Amer: 36 mL/min — ABNORMAL LOW (ref >60)
Glucose, Bld: 87 mg/dL (ref 70–99)
Potassium: 3.5 mmol/L (ref 3.5–5.1)
Sodium: 140 mmol/L (ref 135–145)
Total Bilirubin: 0.5 mg/dL (ref 0.3–1.2)
Total Protein: 5.6 g/dL — ABNORMAL LOW (ref 6.5–8.1)

## 2019-05-31 LAB — ABO/RH: ABO/RH(D): O NEG

## 2019-05-31 LAB — SARS CORONAVIRUS 2 (TAT 6-24 HRS): SARS Coronavirus 2: NEGATIVE

## 2019-05-31 LAB — TSH: TSH: 8.507 u[IU]/mL — ABNORMAL HIGH (ref 0.350–4.500)

## 2019-05-31 LAB — SURGICAL PCR SCREEN
MRSA, PCR: NEGATIVE
Staphylococcus aureus: POSITIVE — AB

## 2019-05-31 LAB — PREPARE RBC (CROSSMATCH)

## 2019-05-31 SURGERY — HEMIARTHROPLASTY, HIP, DIRECT ANTERIOR APPROACH, FOR FRACTURE
Anesthesia: General | Site: Hip | Laterality: Left

## 2019-05-31 MED ORDER — LACTATED RINGERS IV SOLN: INTRAVENOUS | Status: DC

## 2019-05-31 MED ORDER — ROCURONIUM BROMIDE 50 MG/5ML IV SOSY
PREFILLED_SYRINGE | INTRAVENOUS | Status: DC | PRN
  Administered 2019-05-31: 10 mg via INTRAVENOUS
  Administered 2019-05-31: 15 mg via INTRAVENOUS
  Administered 2019-05-31: 40 mg via INTRAVENOUS
  Administered 2019-05-31 (×2): 15 mg via INTRAVENOUS

## 2019-05-31 MED ORDER — POVIDONE-IODINE 10 % EX SWAB: 2.0000 "application " | Freq: Once | CUTANEOUS | Status: DC

## 2019-05-31 MED ORDER — METOCLOPRAMIDE HCL 5 MG/ML IJ SOLN: 5.0000 mg | Freq: Three times a day (TID) | INTRAMUSCULAR | Status: DC | PRN

## 2019-05-31 MED ORDER — FENTANYL CITRATE (PF) 100 MCG/2ML IJ SOLN
INTRAMUSCULAR | Status: AC
  Filled 2019-05-31: qty 2

## 2019-05-31 MED ORDER — SODIUM CHLORIDE 0.9 % IR SOLN
Status: DC | PRN
  Administered 2019-05-31: 3000 mL

## 2019-05-31 MED ORDER — PHENYLEPHRINE HCL-NACL 10-0.9 MG/250ML-% IV SOLN
INTRAVENOUS | Status: DC | PRN
  Administered 2019-05-31: 25 ug/min via INTRAVENOUS

## 2019-05-31 MED ORDER — ONDANSETRON HCL 4 MG/2ML IJ SOLN: 4.0000 mg | Freq: Four times a day (QID) | INTRAMUSCULAR | Status: DC | PRN

## 2019-05-31 MED ORDER — POLYETHYLENE GLYCOL 3350 17 G PO PACK
17.0000 g | PACK | Freq: Every day | ORAL | Status: DC | PRN
  Administered 2019-06-03: 17 g via ORAL

## 2019-05-31 MED ORDER — SODIUM CHLORIDE 0.9% IV SOLUTION: Freq: Once | INTRAVENOUS | Status: DC

## 2019-05-31 MED ORDER — OXYCODONE HCL 5 MG PO TABS: 10.0000 mg | ORAL_TABLET | ORAL | Status: DC | PRN

## 2019-05-31 MED ORDER — BISACODYL 5 MG PO TBEC: 5.0000 mg | DELAYED_RELEASE_TABLET | Freq: Every day | ORAL | Status: DC | PRN

## 2019-05-31 MED ORDER — MENTHOL 3 MG MT LOZG: 1.0000 | LOZENGE | OROMUCOSAL | Status: DC | PRN

## 2019-05-31 MED ORDER — HYDROMORPHONE HCL 1 MG/ML IJ SOLN: 0.5000 mg | INTRAMUSCULAR | Status: DC | PRN

## 2019-05-31 MED ORDER — PHENYLEPHRINE HCL (PRESSORS) 10 MG/ML IV SOLN
INTRAVENOUS | Status: DC | PRN
  Administered 2019-05-31: 80 ug via INTRAVENOUS
  Administered 2019-05-31: 120 ug via INTRAVENOUS

## 2019-05-31 MED ORDER — PHENYLEPHRINE 40 MCG/ML (10ML) SYRINGE FOR IV PUSH (FOR BLOOD PRESSURE SUPPORT)
PREFILLED_SYRINGE | INTRAVENOUS | Status: AC
  Filled 2019-05-31: qty 10

## 2019-05-31 MED ORDER — CEFAZOLIN SODIUM-DEXTROSE 2-3 GM-%(50ML) IV SOLR
INTRAVENOUS | Status: DC | PRN
  Administered 2019-05-31: 2 g via INTRAVENOUS

## 2019-05-31 MED ORDER — CEFAZOLIN SODIUM-DEXTROSE 2-4 GM/100ML-% IV SOLN: 2.0000 g | INTRAVENOUS | Status: DC

## 2019-05-31 MED ORDER — SODIUM CHLORIDE 0.9 % IV SOLN: INTRAVENOUS | Status: DC | PRN

## 2019-05-31 MED ORDER — LACTATED RINGERS IV SOLN: INTRAVENOUS | Status: DC | PRN

## 2019-05-31 MED ORDER — 0.9 % SODIUM CHLORIDE (POUR BTL) OPTIME
TOPICAL | Status: DC | PRN
  Administered 2019-05-31: 1000 mL

## 2019-05-31 MED ORDER — DOCUSATE SODIUM 100 MG PO CAPS
100.0000 mg | ORAL_CAPSULE | Freq: Two times a day (BID) | ORAL | Status: DC
  Administered 2019-05-31 – 2019-06-03 (×6): 100 mg via ORAL
  Filled 2019-05-31 (×6): qty 1

## 2019-05-31 MED ORDER — ALBUMIN HUMAN 5 % IV SOLN: INTRAVENOUS | Status: DC | PRN

## 2019-05-31 MED ORDER — FENTANYL CITRATE (PF) 250 MCG/5ML IJ SOLN
INTRAMUSCULAR | Status: AC
  Filled 2019-05-31: qty 5

## 2019-05-31 MED ORDER — ONDANSETRON HCL 4 MG/2ML IJ SOLN
INTRAMUSCULAR | Status: AC
  Filled 2019-05-31: qty 2

## 2019-05-31 MED ORDER — LIDOCAINE 2% (20 MG/ML) 5 ML SYRINGE
INTRAMUSCULAR | Status: DC | PRN
  Administered 2019-05-31: 60 mg via INTRAVENOUS

## 2019-05-31 MED ORDER — CHLORHEXIDINE GLUCONATE 4 % EX LIQD
60.0000 mL | Freq: Once | CUTANEOUS | Status: AC
  Administered 2019-05-31: 4 via TOPICAL
  Filled 2019-05-31: qty 15

## 2019-05-31 MED ORDER — FENTANYL CITRATE (PF) 100 MCG/2ML IJ SOLN
INTRAMUSCULAR | Status: DC | PRN
  Administered 2019-05-31 (×3): 50 ug via INTRAVENOUS

## 2019-05-31 MED ORDER — MUPIROCIN 2 % EX OINT
1.0000 "application " | TOPICAL_OINTMENT | Freq: Two times a day (BID) | CUTANEOUS | Status: DC
  Administered 2019-05-31 – 2019-06-03 (×7): 1 via TOPICAL
  Filled 2019-05-31: qty 22

## 2019-05-31 MED ORDER — FENTANYL CITRATE (PF) 100 MCG/2ML IJ SOLN
25.0000 ug | INTRAMUSCULAR | Status: DC | PRN
  Administered 2019-05-31 (×2): 25 ug via INTRAVENOUS

## 2019-05-31 MED ORDER — METHOCARBAMOL 1000 MG/10ML IJ SOLN
500.0000 mg | Freq: Three times a day (TID) | INTRAVENOUS | Status: DC | PRN
  Filled 2019-05-31: qty 5

## 2019-05-31 MED ORDER — ENSURE ENLIVE PO LIQD
237.0000 mL | Freq: Two times a day (BID) | ORAL | Status: DC
  Administered 2019-05-31 – 2019-06-03 (×5): 237 mL via ORAL

## 2019-05-31 MED ORDER — TRANEXAMIC ACID-NACL 1000-0.7 MG/100ML-% IV SOLN
INTRAVENOUS | Status: DC | PRN
  Administered 2019-05-31: 1000 mg via INTRAVENOUS

## 2019-05-31 MED ORDER — SUGAMMADEX SODIUM 200 MG/2ML IV SOLN
INTRAVENOUS | Status: DC | PRN
  Administered 2019-05-31: 200 mg via INTRAVENOUS

## 2019-05-31 MED ORDER — PHENOL 1.4 % MT LIQD: 1.0000 | OROMUCOSAL | Status: DC | PRN

## 2019-05-31 MED ORDER — PROPOFOL 10 MG/ML IV BOLUS
INTRAVENOUS | Status: DC | PRN
  Administered 2019-05-31: 80 mg via INTRAVENOUS

## 2019-05-31 MED ORDER — ONDANSETRON HCL 4 MG/2ML IJ SOLN
INTRAMUSCULAR | Status: DC | PRN
  Administered 2019-05-31: 4 mg via INTRAVENOUS

## 2019-05-31 MED ORDER — DEXAMETHASONE SODIUM PHOSPHATE 10 MG/ML IJ SOLN
INTRAMUSCULAR | Status: DC | PRN
  Administered 2019-05-31: 4 mg via INTRAVENOUS

## 2019-05-31 MED ORDER — CEFAZOLIN SODIUM-DEXTROSE 2-4 GM/100ML-% IV SOLN
2.0000 g | Freq: Four times a day (QID) | INTRAVENOUS | Status: AC
  Administered 2019-05-31 – 2019-06-01 (×2): 2 g via INTRAVENOUS
  Filled 2019-05-31 (×2): qty 100

## 2019-05-31 MED ORDER — TRANEXAMIC ACID-NACL 1000-0.7 MG/100ML-% IV SOLN
INTRAVENOUS | Status: AC
  Filled 2019-05-31: qty 100

## 2019-05-31 MED ORDER — OXYCODONE HCL 5 MG PO TABS
5.0000 mg | ORAL_TABLET | ORAL | Status: DC | PRN
  Filled 2019-05-31: qty 1

## 2019-05-31 MED ORDER — ONDANSETRON HCL 4 MG PO TABS: 4.0000 mg | ORAL_TABLET | Freq: Four times a day (QID) | ORAL | Status: DC | PRN

## 2019-05-31 MED ORDER — METHOCARBAMOL 500 MG PO TABS: 500.0000 mg | ORAL_TABLET | Freq: Three times a day (TID) | ORAL | Status: DC | PRN

## 2019-05-31 MED ORDER — PROPOFOL 10 MG/ML IV BOLUS
INTRAVENOUS | Status: AC
  Filled 2019-05-31: qty 20

## 2019-05-31 MED ORDER — METOCLOPRAMIDE HCL 5 MG PO TABS: 5.0000 mg | ORAL_TABLET | Freq: Three times a day (TID) | ORAL | Status: DC | PRN

## 2019-05-31 MED ORDER — MAGNESIUM CITRATE PO SOLN: 1.0000 | Freq: Once | ORAL | Status: DC | PRN

## 2019-05-31 MED ORDER — VANCOMYCIN HCL 1000 MG IV SOLR
INTRAVENOUS | Status: AC
  Filled 2019-05-31: qty 1000

## 2019-05-31 MED ORDER — ADULT MULTIVITAMIN W/MINERALS CH
1.0000 | ORAL_TABLET | Freq: Every day | ORAL | Status: DC
  Administered 2019-06-01 – 2019-06-03 (×3): 1 via ORAL
  Filled 2019-05-31 (×3): qty 1

## 2019-05-31 MED ORDER — VANCOMYCIN HCL 1 G IV SOLR
INTRAVENOUS | Status: DC | PRN
  Administered 2019-05-31: 1000 mg

## 2019-05-31 MED ORDER — ACETAMINOPHEN 500 MG PO TABS
1000.0000 mg | ORAL_TABLET | Freq: Three times a day (TID) | ORAL | Status: AC
  Administered 2019-06-01 (×2): 1000 mg via ORAL
  Filled 2019-05-31 (×2): qty 2

## 2019-05-31 SURGICAL SUPPLY — 71 items
APL PRP STRL LF DISP 70% ISPRP (MISCELLANEOUS) ×2
BLADE SAGITTAL (BLADE) ×2
BLADE SAGITTAL 25.0X1.27X90 (BLADE) ×2 IMPLANT
BLADE SAW THK1.47X90X25X (BLADE) IMPLANT
BRUSH FEMORAL CANAL (MISCELLANEOUS) ×1 IMPLANT
CEMENT BONE DEPUY (Cement) ×2 IMPLANT
CEMENTRALIZER 8.5MM (Orthopedic Implant) ×1 IMPLANT
CHLORAPREP W/TINT 26 (MISCELLANEOUS) ×4 IMPLANT
CLSR STERI-STRIP ANTIMIC 1/2X4 (GAUZE/BANDAGES/DRESSINGS) ×2 IMPLANT
COVER SURGICAL LIGHT HANDLE (MISCELLANEOUS) ×2 IMPLANT
COVER WAND RF STERILE (DRAPES) IMPLANT
DRAPE INCISE IOBAN 66X45 STRL (DRAPES) ×2 IMPLANT
DRAPE ORTHO SPLIT 77X108 STRL (DRAPES) ×4
DRAPE SURG ORHT 6 SPLT 77X108 (DRAPES) ×2 IMPLANT
DRAPE U-SHAPE 47X51 STRL (DRAPES) ×2 IMPLANT
DRESSING PEEL AND PLAC PRVNA20 (GAUZE/BANDAGES/DRESSINGS) IMPLANT
DRSG PEEL AND PLACE PREVENA 20 (GAUZE/BANDAGES/DRESSINGS) ×2
DURAPREP 26ML APPLICATOR (WOUND CARE) ×1 IMPLANT
ELECT BLADE 4.0 EZ CLEAN MEGAD (MISCELLANEOUS)
ELECT BLADE 6.5 EXT (BLADE) ×2 IMPLANT
ELECT CAUTERY BLADE 6.4 (BLADE) ×2 IMPLANT
ELECT REM PT RETURN 9FT ADLT (ELECTROSURGICAL) ×2
ELECTRODE BLDE 4.0 EZ CLN MEGD (MISCELLANEOUS) IMPLANT
ELECTRODE REM PT RTRN 9FT ADLT (ELECTROSURGICAL) ×1 IMPLANT
GLOVE BIO SURGEON STRL SZ 6.5 (GLOVE) ×3 IMPLANT
GLOVE BIO SURGEON STRL SZ8 (GLOVE) ×6 IMPLANT
GLOVE BIOGEL PI IND STRL 7.5 (GLOVE) IMPLANT
GLOVE BIOGEL PI IND STRL 8 (GLOVE) ×1 IMPLANT
GLOVE BIOGEL PI INDICATOR 7.5 (GLOVE) ×1
GLOVE BIOGEL PI INDICATOR 8 (GLOVE) ×1
GLOVE BIOGEL PI ORTHO PRO SZ8 (GLOVE)
GLOVE PI ORTHO PRO STRL SZ8 (GLOVE) IMPLANT
GOWN STRL REUS W/ TWL LRG LVL3 (GOWN DISPOSABLE) ×2 IMPLANT
GOWN STRL REUS W/ TWL XL LVL3 (GOWN DISPOSABLE) ×2 IMPLANT
GOWN STRL REUS W/TWL 2XL LVL3 (GOWN DISPOSABLE) IMPLANT
GOWN STRL REUS W/TWL LRG LVL3 (GOWN DISPOSABLE) ×6
GOWN STRL REUS W/TWL XL LVL3 (GOWN DISPOSABLE)
HANDPIECE INTERPULSE COAX TIP (DISPOSABLE) ×2
HEAD FEM UNIPOLAR 51 OD STRL (Hips) ×1 IMPLANT
KIT BASIN OR (CUSTOM PROCEDURE TRAY) ×2 IMPLANT
KIT DRSG PREVENA PLUS 7DAY 125 (MISCELLANEOUS) ×1 IMPLANT
KIT TURNOVER KIT B (KITS) ×2 IMPLANT
MANIFOLD NEPTUNE II (INSTRUMENTS) ×2 IMPLANT
NDL MAYO TROCAR (NEEDLE) IMPLANT
NEEDLE MAYO TROCAR (NEEDLE) IMPLANT
PACK TOTAL JOINT (CUSTOM PROCEDURE TRAY) ×2 IMPLANT
PAD ARMBOARD 7.5X6 YLW CONV (MISCELLANEOUS) ×4 IMPLANT
PILLOW ABDUCTION MEDIUM (MISCELLANEOUS) ×2 IMPLANT
PRESSURIZER FEMORAL UNIV (MISCELLANEOUS) ×1 IMPLANT
RESTRICTOR CEMENT PE SZ 2 (Cement) ×1 IMPLANT
RETRIEVER SUT HEWSON (MISCELLANEOUS) ×3 IMPLANT
SET HNDPC FAN SPRY TIP SCT (DISPOSABLE) IMPLANT
SPACER FEM TAPERED +0 12/14 (Hips) ×1 IMPLANT
STAPLER VISISTAT 35W (STAPLE) IMPLANT
STEM SUMMIT CEMENT BASIC SZ4 (Hips) ×1 IMPLANT
SUT ETHILON 2 0 FS 18 (SUTURE) ×3 IMPLANT
SUT FIBERWIRE #2 38 REV NDL BL (SUTURE) ×10
SUT MON AB 3-0 SH 27 (SUTURE)
SUT MON AB 3-0 SH27 (SUTURE) IMPLANT
SUT VIC AB 0 CT1 27 (SUTURE) ×8
SUT VIC AB 0 CT1 27XBRD ANBCTR (SUTURE) ×2 IMPLANT
SUT VIC AB 2-0 CT1 27 (SUTURE) ×4
SUT VIC AB 2-0 CT1 TAPERPNT 27 (SUTURE) ×2 IMPLANT
SUT VIC AB 2-0 SH 27 (SUTURE)
SUT VIC AB 2-0 SH 27XBRD (SUTURE) IMPLANT
SUT VIC AB 3-0 SH 27 (SUTURE) ×4
SUT VIC AB 3-0 SH 27X BRD (SUTURE) IMPLANT
SUTURE FIBERWR#2 38 REV NDL BL (SUTURE) ×2 IMPLANT
TOWER CARTRIDGE SMART MIX (DISPOSABLE) ×1 IMPLANT
TRAY FOLEY W/BAG SLVR 14FR (SET/KITS/TRAYS/PACK) ×1 IMPLANT
YANKAUER SUCT BULB TIP NO VENT (SUCTIONS) ×1 IMPLANT

## 2019-05-31 NOTE — Anesthesia Procedure Notes (Signed)
Procedure Name: Intubation Date/Time: 05/31/2019 3:05 PM Performed by: Inda Coke, CRNA Pre-anesthesia Checklist: Patient identified, Emergency Drugs available, Suction available and Patient being monitored Patient Re-evaluated:Patient Re-evaluated prior to induction Oxygen Delivery Method: Circle System Utilized Preoxygenation: Pre-oxygenation with 100% oxygen Induction Type: IV induction Ventilation: Mask ventilation without difficulty and Oral airway inserted - appropriate to patient size Laryngoscope Size: Mac and 3 Grade View: Grade I Tube type: Oral Tube size: 7.0 mm Number of attempts: 1 Airway Equipment and Method: Stylet and Oral airway Placement Confirmation: ETT inserted through vocal cords under direct vision,  positive ETCO2 and breath sounds checked- equal and bilateral Secured at: 21 cm Tube secured with: Tape Dental Injury: Teeth and Oropharynx as per pre-operative assessment

## 2019-05-31 NOTE — Transfer of Care (Signed)
Immediate Anesthesia Transfer of Care Note  Patient: Lisa [name]  Procedure(s) Performed: LEFT HIP HEMIARTHROPLASTY (Left Hip)  Patient Location: PACU  Anesthesia Type:General  Level of Consciousness: awake and alert   Airway & Oxygen Therapy: Patient Spontanous Breathing and Patient connected to nasal cannula oxygen  Post-op Assessment: Report given to RN and Post -op Vital signs reviewed and stable  Post vital signs: Reviewed and stable  Last Vitals:  Vitals Value Taken Time  BP 138/77 05/31/19 1730  Temp    Pulse 91 05/31/19 1738  Resp 22 05/31/19 1738  SpO2 99 % 05/31/19 1738  Vitals shown include unvalidated device data.  Last Pain:  Vitals:   05/31/19 1239  TempSrc:   PainSc: 0-No pain      Patients Stated Pain Goal: 0 (35/82/51 8984)  Complications: No apparent anesthesia complications

## 2019-05-31 NOTE — Op Note (Signed)
Orthopaedic Surgery Operative Note (CSN: [id number])  [name]  1932-04-26 Date of Surgery: 05/31/2019   Diagnoses:  Left femoral neck fracture  Procedure: Left hip hemiarthroplasty, cemented   Operative Finding Successful completion of the planned procedure.  Patient's femoral canal and proximal femur ratio was such that we felt that we had better fit with a size 4 stem and cement rather than trying to upsize.  Case was clean and without issue.  We had good fixation and stability to 90 degrees, adduction and internal rotation to 60 degrees.  No length abnormality noted Intra-Op.  Patient blood loss was higher than his typical likely due to generalized tissue using as there were no obvious bleeders and the patient had in the last 48 hours had Xarelto.  Implants: Depuy [place] basic size 4 stem, 0 spacer, 51 head  Post-operative plan: The patient will be weightbearing as tolerated with posterior hip precautions.  The patient will be readmitted to floor.  DVT prophylaxis can restart oral baseline anticoagulants the evening of 06/01/2019.   Pain control with PRN pain medication preferring oral medicines.  Follow up plan will be scheduled in approximately 14 days for incision check and XR and suture removal.  Post-Op Diagnosis: Same Surgeons:Primary: Hiram Gash, MD Assistants:Caroline McBane PA-C Location: [facility] OR ROOM 15 Anesthesia: General Antibiotics: Ancef 2 g with local vancomycin powder 1 g at the surgical site Tourniquet time: * No tourniquets in log * Estimated Blood Loss: 035 Complications: None Specimens: None Implants: Implant Name Type Inv. Item Serial No. Manufacturer Lot No. LRB No. Used Action  CEMENT BONE DEPUY - [id number] Cement CEMENT BONE DEPUY  DEPUY SYNTHES [id number] Left 2 Implanted  CEMENT RESTRICTOR DEPUY SZ 2 - [id number] Cement CEMENT RESTRICTOR DEPUY SZ 2  DEPUY SYNTHES [id number] Left 1 Implanted  STEM SUMMIT CEMENT BASIC SZ4 - [id number] Hips STEM SUMMIT CEMENT BASIC  SZ4  DEPUY SYNTHES [id number] Left 1 Implanted  CEMENTRALIZER 8.5MM - [id number] Orthopedic Implant CEMENTRALIZER 8.5MM  DEPUY SYNTHES [id number] Left 1 Implanted  SPACER TAPER DEPUY - [id number] Hips SPACER TAPER DEPUY  DEPUY SYNTHES [id number] Left 1 Implanted  HIP BALL DEPUY - [id number] Hips HIP BALL DEPUY  DEPUY SYNTHES [id number] Left 1 Implanted    Indications for Surgery:   CHLOEE TENA is a 86 y.o. female with fall in the setting of cancer and ongoing chemotherapy with femoral neck fracture.  Unfortunately without surgery the patient's mobility and quality of life would suffer significantly and we discussed surgical nonsurgical options though her risk of surgery were higher than the standard patient.  Benefits and risks of operative and nonoperative management were discussed prior to surgery with patient/guardian(s) and informed consent form was completed.  Specific risks including infection, need for additional surgery, hip instability, wound concerns, periprosthetic fracture.   Procedure:   The patient was identified properly. Informed consent was obtained and the surgical site was marked. The patient was taken up to suite where general anesthesia was induced.  The patient was positioned lateral with a marked to positioner with the left side up.  The left hip was prepped and draped in the usual sterile fashion.  Timeout was performed before the beginning of the case.  We made an incision centered over the greater trochanter with a scalpel. We used the scalpel to continue to dissect to the fascia. The fascia was pierced with Bovie electrocautery. Mayo scissors were used to cut the fascia in a longitudinal fashion. The gluteus maximus fibers were bluntly  split in line with their fibers.   The femur was slowly internally rotated, putting tension on the posterior structures. Bovie electrocautery was used to dissect the short external rotators off of the insertion onto the femur. After the short external  rotators were transected, we visualized the femoral neck fracture. We identified the sciatic nerve by palpation and verified that it was not in danger from dissection.   We made a T-shaped capsulotomy. We made a neck cut approximately 2 cm above the lesser trochanter with a a reciprocating saw. We removed the femoral head. We used the box cutter to cut away some of the greater trochanter for ease of insertion of the stem. We then used the canal finder to locate the femoral canal.   Using the angle of the femoral neck as our guide for version, we broached sequentially. We trialed components and found appropriate fit and stability.  The patient would come to full extension of the hip.  Hip stability as above.  We then removed the trials as well as the broach. We ensured there were no foreign bodies or bone within the acetabulum.   We did feel that without cement we could not get appropriate fit with a neck size stem.  We decided to cement.  We prepped the canal in the standard fashion and irrigated copiously.  There is no sign of lytic lesion or anything concerning for cancerous spread.  We did clear all cancellous bone to avoid any issues with this however going forward.  We prepared the canal and placed a canal cement restrictor before placing her stem.  Stem placement went without issue.  We copiously irrigated the wound.  We then carefully placed our stem, size listed above before assembling the head and neck together onto the stem. We then reduced the hip. Again, that was stable in the previously mentioned manipulations.   We repaired the posterior capsule taking care to both repair the T split individual #2 FiberWire sutures.  Short external rotators repaired to the greater trochanter to bone tunnels. We closed the fascia of the iliotibial band and gluteus maximus with running and intterupted Vicryl sutures. We then closed Scarpa's fascia with running Vicryl sutures. Skin was closed with 3-0 Vicryl and  2-0 nylon with a Praveena strip VAC dressing.  Abduction pillow was placed.  Patient was awoken taken to PACU in stable condition.  Noemi Chapel, PA-C, present and scrubbed throughout the case, critical for completion in a timely fashion, and for retraction, instrumentation, closure.

## 2019-05-31 NOTE — Telephone Encounter (Signed)
Message received that pt has hip fx and requested to cancel her appts next week.  Done.

## 2019-05-31 NOTE — H&P (View-Only) (Signed)
Patient last dosed anticoagulants on 3/1.  Plan for surgery today.

## 2019-05-31 NOTE — Consult Note (Signed)
Patient last dosed anticoagulants on 3/1.  Plan for surgery today.

## 2019-05-31 NOTE — Consult Note (Signed)
Reason for Consult:Left hip fx Referring Physician: E [name] is an 86 y.o. female.  HPI: Lisa [name] was at [employer] and fell when she tripped on a chair. She had immediate left hip pain and could not get up. She was brought to the ED where x-rays showed a left hip fx and orthopedic surgery was consulted. She c/o localized pain to the area. She lives at home with her husband and, currently, her son.  Past Medical History:  Diagnosis Date  . Diverticulosis   . Headache   . Hemorrhoids    internal and external  . Hyperlipidemia 01/29/1991  . Hypertension 10/29/1995  . Hypothyroidism 10/29/1995  . Lung mass    right middle lobe  . Melanoma ([place])    h/o, local excision. no chemo or rady.   . Skin cancer (melanoma) ([place])   . Tubular adenoma of colon   . Wears dentures     Past Surgical History:  Procedure Laterality Date  . BRONCHIAL BIOPSY  11/23/2017   Procedure: BRONCHIAL BIOPSIES;  Surgeon: Grace Isaac, MD;  Location: [facility] OR;  Service: Thoracic;;  . cataract surgery  2007, 2008   repair bilat  . DG BARIUM ENEMA ([place] HX) N/A 2016   [name]  . HERNIA REPAIR  04/25/2001  . IR IMAGING GUIDED PORT INSERTION  07/05/2018  . IR US GUIDE BX ASP/DRAIN  11/17/2017  . KNEE SURGERY     meniscus tear per Dr. Ronnie Derby, R knee  . MULTIPLE TOOTH EXTRACTIONS    . SEPTOPLASTY  2006   Dr. Ernesto Rutherford  . SKIN CANCER EXCISION    . TONSILLECTOMY  1954  . VAGINAL HYSTERECTOMY    . VIDEO BRONCHOSCOPY WITH ENDOBRONCHIAL ULTRASOUND N/A 11/23/2017   Procedure: VIDEO BRONCHOSCOPY WITH ENDOBRONCHIAL ULTRASOUND;  Surgeon: Grace Isaac, MD;  Location: [facility] OR;  Service: Thoracic;  Laterality: N/A;    Family History  Problem Relation Age of Onset  . Stroke Mother   . Heart disease Mother        CHF  . Stomach cancer Sister   . Cancer Brother        Metastatic cancer  . Colon cancer Neg Hx   . Breast cancer Neg Hx     Social History:  reports that she quit smoking about 30  years ago. Her smoking use included cigarettes. She started smoking about 60 years ago. She has a 30.00 pack-year smoking history. She has never used smokeless tobacco. She reports that she does not drink alcohol or use drugs.  Allergies:  Allergies  Allergen Reactions  . Atorvastatin Other (See Comments)    CAUSED BURNING AND TINGLING IN LEGS  . Celebrex [Celecoxib] Swelling and Rash    SWELLING REACTION UNSPECIFIED   . Cholecalciferol Anxiety and Other (See Comments)    Nervous, crying with high dose replacement  . Simvastatin Other (See Comments)    Muscle pain  . Tape Other (See Comments)    SKIN IS SENSITIVE!!    Medications: I have reviewed the patient's current medications.  Results for orders placed or performed during the hospital encounter of 05/30/19 (from the past 48 hour(s))  Basic metabolic panel     Status: Abnormal   Collection Time: 05/30/19  7:30 PM  Result Value Ref Range   Sodium 141 135 - 145 mmol/L   Potassium 3.6 3.5 - 5.1 mmol/L   Chloride 107 98 - 111 mmol/L   CO2 26 22 - 32 mmol/L  Glucose, Bld 102 (H) 70 - 99 mg/dL    Comment: Glucose reference range applies only to samples taken after fasting for at least 8 hours.   BUN 19 8 - 23 mg/dL   Creatinine, Ser 1.49 (H) 0.44 - 1.00 mg/dL   Calcium 8.6 (L) 8.9 - 10.3 mg/dL   GFR calc non Af Amer 32 (L) >60 mL/min   GFR calc Af Amer 37 (L) >60 mL/min   Anion gap 8 5 - 15    Comment: Performed at [facility] [address], [place], [place] [address]  CBC with Differential     Status: Abnormal   Collection Time: 05/30/19  7:30 PM  Result Value Ref Range   WBC 8.1 4.0 - 10.5 K/uL   RBC 3.39 (L) 3.87 - 5.11 MIL/uL   Hemoglobin 10.1 (L) 12.0 - 15.0 g/dL   HCT 32.3 (L) 36.0 - 46.0 %   MCV 95.3 80.0 - 100.0 fL   MCH 29.8 26.0 - 34.0 pg   MCHC 31.3 30.0 - 36.0 g/dL   RDW 19.9 (H) 11.5 - 15.5 %   Platelets 65 (L) 150 - 400 K/uL    Comment: REPEATED TO VERIFY PLATELET COUNT CONFIRMED BY  SMEAR Immature Platelet Fraction may be clinically indicated, consider ordering this additional test [id number]    nRBC 0.0 0.0 - 0.2 %   Neutrophils Relative % 64 %   Neutro Abs 7.0 1.7 - 7.7 K/uL   Band Neutrophils 22 %   Lymphocytes Relative 12 %   Lymphs Abs 1.0 0.7 - 4.0 K/uL   Monocytes Relative 2 %   Monocytes Absolute 0.2 0.1 - 1.0 K/uL   Eosinophils Relative 0 %   Eosinophils Absolute 0.0 0.0 - 0.5 K/uL   Basophils Relative 0 %   Basophils Absolute 0.0 0.0 - 0.1 K/uL   WBC Morphology INCREASED BANDS (>20% BANDS)     Comment: MILD LEFT SHIFT (1-5% METAS, OCC MYELO, OCC BANDS) TOXIC GRANULATION    Abs Immature Granulocytes 0.00 0.00 - 0.07 K/uL    Comment: Performed at [facility], [place] [address] [place], [place] [address]  SARS CORONAVIRUS 2 (TAT 6-24 HRS) Nasopharyngeal Nasopharyngeal Swab     Status: None   Collection Time: 05/30/19  7:55 PM   Specimen: Nasopharyngeal Swab  Result Value Ref Range   SARS Coronavirus 2 NEGATIVE NEGATIVE    Comment: (NOTE) SARS-CoV-2 target nucleic acids are NOT DETECTED. The SARS-CoV-2 RNA is generally detectable in upper and lower respiratory specimens during the acute phase of infection. Negative results do not preclude SARS-CoV-2 infection, do not rule out co-infections with other pathogens, and should not be used as the sole basis for treatment or other patient management decisions. Negative results must be combined with clinical observations, patient history, and epidemiological information. The expected result is Negative. Fact Sheet for Patients: SugarRoll.be Fact Sheet for Healthcare Providers: https://www.woods-mathews.com/ This test is not yet approved or cleared by the [place] FDA and  has been authorized for detection and/or diagnosis of SARS-CoV-2 by FDA under an Emergency Use Authorization (EUA). This EUA will remain  in effect (meaning this test can be used) for  the duration of the COVID-19 declaration under Section 56 4(b)(1) of the Act, 21 U.S.C. section 360bbb-3(b)(1), unless the authorization is terminated or revoked sooner. Performed at [facility], [place] [address], [place], [place] [address]   Type and screen [place]     Status: None  Collection Time: 05/30/19  9:23 PM  Result Value Ref Range   ABO/RH(D) O NEG    Antibody Screen NEG    Sample Expiration      06/02/2019,2359 Performed at [facility], [place] [address], [place], [place] [address]   ABO/Rh     Status: None   Collection Time: 05/30/19  9:23 PM  Result Value Ref Range   ABO/RH(D)      O NEG Performed at [facility] [address] [place], [place] [address]   Surgical pcr screen     Status: Abnormal   Collection Time: 05/31/19  4:41 AM   Specimen: Nasal Mucosa; Nasal Swab  Result Value Ref Range   MRSA, PCR NEGATIVE NEGATIVE   Staphylococcus aureus POSITIVE (A) NEGATIVE    Comment: (NOTE) The Xpert SA Assay (FDA approved for NASAL specimens in patients 62 years of age and older), is one component of a comprehensive surveillance program. It is not intended to diagnose infection nor to guide or monitor treatment. Performed at [facility], [place] [address], [place], [place] [address]   CBC with Differential/Platelet     Status: Abnormal   Collection Time: 05/31/19  8:02 AM  Result Value Ref Range   WBC 7.3 4.0 - 10.5 K/uL   RBC 3.40 (L) 3.87 - 5.11 MIL/uL   Hemoglobin 10.0 (L) 12.0 - 15.0 g/dL   HCT 32.3 (L) 36.0 - 46.0 %   MCV 95.0 80.0 - 100.0 fL   MCH 29.4 26.0 - 34.0 pg   MCHC 31.0 30.0 - 36.0 g/dL   RDW 20.0 (H) 11.5 - 15.5 %   Platelets 61 (L) 150 - 400 K/uL    Comment: REPEATED TO VERIFY Immature Platelet Fraction may be clinically indicated, consider ordering this additional test [id number] CONSISTENT WITH PREVIOUS RESULT    nRBC 0.0 0.0 - 0.2 %   Neutrophils Relative % 73 %   Neutro Abs 5.4 1.7 - 7.7  K/uL   Lymphocytes Relative 15 %   Lymphs Abs 1.1 0.7 - 4.0 K/uL   Monocytes Relative 6 %   Monocytes Absolute 0.5 0.1 - 1.0 K/uL   Eosinophils Relative 1 %   Eosinophils Absolute 0.1 0.0 - 0.5 K/uL   Basophils Relative 1 %   Basophils Absolute 0.1 0.0 - 0.1 K/uL   Immature Granulocytes 4 %   Abs Immature Granulocytes 0.31 (H) 0.00 - 0.07 K/uL    Comment: Performed at [facility] [address], [place], [place] [address]    DG Chest 1 View  Result Date: 05/30/2019 CLINICAL DATA:  Fall, no chest complaints EXAM: CHEST  1 VIEW COMPARISON:  CT 03/07/2019, PET-CT 03/21/2019 FINDINGS: Large right infrahilar soft tissue attenuation compatible with previously seen FDG avid tissue, most compatible with recurrent lung malignancy. Obscuration of the right hemidiaphragm may reflect chronic right effusion seen on comparison studies. Additional atelectatic changes in the right lung base are likely present as well. Some scarring reticular changes is present in the left lung base as well, similar to comparison cross-sectional imaging. No new consolidative opacity is seen in the lungs. Cardiomediastinal contours are unremarkable for portable technique with a calcified, tortuous aorta. No acute osseous or soft tissue abnormality. Degenerative changes are present in the imaged spine and shoulders. Right IJ approach Port-A-Cath tip terminates at the level of the right atrium. IMPRESSION: Right perihilar attenuation compatible with recurrent malignancy seen on comparison PET-CT. Lung volume loss and chronic right effusion  in the right lung base are likely similar to prior. No new acute cardiopulmonary abnormality or traumatic findings in the chest. Electronically Signed   By: Lovena Le M.D.   On: 05/30/2019 20:00   DG Hip Unilat With Pelvis 2-3 Views Left  Result Date: 05/30/2019 CLINICAL DATA:  Mechanical fall, left hip pain EXAM: DG HIP (WITH OR WITHOUT PELVIS) 2-3V LEFT COMPARISON:  PET-CT 03/21/2019  FINDINGS: Mildly foreshortened transcervical left femoral neck fracture with associated left hip effusion. Left femoral head remains normally located. Remaining bones of the pelvis are intact and congruent. Bilateral hip arthrosis as well as additional degenerative changes in the lower lumbar spine, SI joints and symphysis pubis are noted. Stable likely bone island seen in the posterior right acetabulum. Vascular calcium noted in the pelvis. Remaining soft tissues are unremarkable. IMPRESSION: Mildly foreshortened transcervical left femoral neck fracture with associated left hip effusion. Electronically Signed   By: Lovena Le M.D.   On: 05/30/2019 19:57    Review of Systems  HENT: Negative for ear discharge, ear pain, hearing loss and tinnitus.   Eyes: Negative for photophobia and pain.  Respiratory: Negative for cough and shortness of breath.   Cardiovascular: Negative for chest pain.  Gastrointestinal: Negative for abdominal pain, nausea and vomiting.  Genitourinary: Negative for dysuria, flank pain, frequency and urgency.  Musculoskeletal: Positive for arthralgias (Left hip). Negative for back pain, myalgias and neck pain.  Neurological: Negative for dizziness and headaches.  Hematological: Does not bruise/bleed easily.  Psychiatric/Behavioral: The patient is not nervous/anxious.    Blood pressure 133/76, pulse 82, temperature 97.6 F (36.4 C), temperature source Oral, resp. rate 17, SpO2 96 %. Physical Exam  Constitutional: She appears well-developed and well-nourished. No distress.  HENT:  Head: Normocephalic and atraumatic.  Eyes: Conjunctivae are normal. Right eye exhibits no discharge. Left eye exhibits no discharge. No scleral icterus.  Cardiovascular: Normal rate and regular rhythm.  Respiratory: Effort normal. No respiratory distress.  Musculoskeletal:     Cervical back: Normal range of motion.     Comments: LLE No traumatic wounds, ecchymosis, or rash  Hip TTP  No knee or  ankle effusion  Knee stable to varus/ valgus and anterior/posterior stress  Sens DPN, SPN, TN intact  Motor EHL, ext, flex, evers 5/5  DP 1+, PT 1+, No significant edema  Neurological: She is alert.  Skin: Skin is warm and dry. She is not diaphoretic.  Psychiatric: She has a normal mood and affect. Her behavior is normal.    Assessment/Plan: Left hip fx -- Plan hip hemi later today by Dr. Griffin Basil. Please keep NPO. Multiple medical problems including stage III lung ca w/active chemo, HTN, and anemia of chronic disease -- per primary service    Lisette Abu, PA-C Orthopedic Surgery [phone] 05/31/2019, 9:33 AM

## 2019-05-31 NOTE — Interval H&P Note (Signed)
History and Physical Interval Note:  05/31/2019 1:13 PM  Lisa [name]  has presented today for surgery, with the diagnosis of left hip fracture.  The various methods of treatment have been discussed with the patient and family. After consideration of risks, benefits and other options for treatment, the patient has consented to  Procedure(s): LEFT HIP HEMIARTHROPLASTY (Left) as a surgical intervention.  The patient's history has been reviewed, patient examined, no change in status, stable for surgery.  I have reviewed the patient's chart and labs.  Questions were answered to the patient's satisfaction.     Hiram Gash

## 2019-05-31 NOTE — Progress Notes (Signed)
Rec'd phone call from blood bank about platelets. They are currently waiting for a call back from Dr. Jerilee Hoh to see if patient needed radiated platelets. If patient needs radiated platelets, they will need to be sent back to [facility] to be radiated and then brought back here. If patient doesn't need them to be radiated, then we can get them right away.

## 2019-05-31 NOTE — Anesthesia Preprocedure Evaluation (Addendum)
Anesthesia Evaluation  Patient identified by MRN, date of birth, ID band Patient awake    Reviewed: Allergy & Precautions, H&P , NPO status , Patient's Chart, lab work & pertinent test results  Airway Mallampati: II  TM Distance: >3 FB Neck ROM: Full    Dental no notable dental hx. (+) Partial Lower, Partial Upper, Dental Advisory Given   Pulmonary neg pulmonary ROS, former smoker,  Lung CA   Pulmonary exam normal breath sounds clear to auscultation       Cardiovascular hypertension, Pt. on medications  Rhythm:Regular Rate:Normal     Neuro/Psych  Headaches, negative psych ROS   GI/Hepatic negative GI ROS, Neg liver ROS,   Endo/Other  Hypothyroidism   Renal/GU negative Renal ROS  negative genitourinary   Musculoskeletal   Abdominal   Peds  Hematology negative hematology ROS (+)   Anesthesia Other Findings   Reproductive/Obstetrics negative OB ROS                            Anesthesia Physical Anesthesia Plan  ASA: III  Anesthesia Plan: General   Post-op Pain Management:    Induction: Intravenous  PONV Risk Score and Plan: 4 or greater and Ondansetron, Dexamethasone and Treatment may vary due to age or medical condition  Airway Management Planned: Oral ETT  Additional Equipment:   Intra-op Plan:   Post-operative Plan: Extubation in OR  Informed Consent: I have reviewed the patients History and Physical, chart, labs and discussed the procedure including the risks, benefits and alternatives for the proposed anesthesia with the patient or authorized representative who has indicated his/her understanding and acceptance.     Dental advisory given  Plan Discussed with: CRNA  Anesthesia Plan Comments:         Anesthesia Quick Evaluation

## 2019-05-31 NOTE — Progress Notes (Signed)
PROGRESS NOTE    Lisa [name]  SHF:[id number] DOB: 22-Mar-1933 DOA: 05/30/2019 PCP: [name], MD     Brief Narrative:  86 year old woman admitted from home on 3/2 following a mechanical fall.  She denies loss of consciousness.  She had immediate pain of her left hip.  X-rays in the ED revealed a left hip fracture.  Orthopedics, Dr. Griffin Basil, is on board.  He is planning on taking her to surgery on 3/3 for left hip hemiarthroplasty.  Her past medical history significant for stage III lung cancer currently on chemotherapy, previous history of DVT on Xarelto which is currently on hold, hypertension and stage III chronic kidney disease.  She also has pancytopenia likely related to chemotherapy.   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 -Discussed with orthopedics, Dr. Griffin Basil.  Plan for surgery today. -Commence PT/OT tomorrow for disposition planning, suspect will need SNF.  Stage III lung cancer -On chemotherapy, followed outpatient by Dr. Earlie Server.  Pancytopenia, chemotherapy-induced -She did receive 1 unit of PRBCs on 3/1 due to her anemia. -Hemoglobin is currently 10, optimized for surgery, platelet count is a little low at 61.  History of DVT -On Xarelto as an outpatient, currently on hold.  Hypertension -Is currently well controlled. -Continue amlodipine.  Hypothyroidism -Check TSH, continue home dose of Synthroid.  Hyperlipidemia -Does not appear to be on any medications as an outpatient.   DVT prophylaxis: SCDs Code Status: Full code Family Communication: Patient only Disposition Plan: To OR 3/3 for hip repair, final recommendations on disposition pending PT/OT  evaluations post surgery  Consultants:   Orthopedics, Dr. Griffin Basil  Procedures:   Left hip ORIF planned for 3/3  Antimicrobials:  Anti-infectives (From admission, onward)   None       Subjective: Lying in bed, in good spirits, no complaints, mild left hip pain with positioning in bed.  Objective: Vitals:   05/30/19 2121 05/30/19 2348 05/31/19 0435 05/31/19 0816  BP:  (!) 142/73 132/79 133/76  Pulse: 86 86 85 82  Resp: 18 16 17 17   Temp:  98 F (36.7 C) 97.8 F (36.6 C) 97.6 F (36.4 C)  TempSrc:  Oral Oral Oral  SpO2: 95% 98% 91% 96%    Intake/Output Summary (Last 24 hours) at 05/31/2019 5027 Last data filed at 05/31/2019 0300 Gross per 24 hour  Intake --  Output 400 ml  Net -400 ml   There were no vitals filed for this visit.  Examination:  General exam: Alert, awake, oriented x 3 Respiratory system: Clear to auscultation. Respiratory effort normal. Cardiovascular system:RRR. No murmurs, rubs, gallops. Gastrointestinal system: Abdomen is nondistended, soft and nontender. No organomegaly or masses felt. Normal bowel sounds heard. Central nervous system: Alert and oriented. No focal neurological deficits. Extremities: No C/C/E, +pedal pulses.  Left leg is shortened and externally rotated Skin: No rashes, lesions or ulcers Psychiatry: Judgement and insight appear normal. Mood & affect appropriate.     Data Reviewed: I have personally reviewed following labs and imaging studies  CBC: Recent Labs  Lab 05/25/19 1424 05/29/19 1258 05/30/19 1930 05/31/19 0802  WBC 3.7* 7.3 8.1 7.3  NEUTROABS 1.2* 4.9 7.0 5.4  HGB 7.8* 7.5* 10.1* 10.0*  HCT 25.1* 24.5* 32.3* 32.3*  MCV 96.9 96.1 95.3 95.0  PLT 56* 66* 65* 61*   Basic Metabolic Panel: Recent Labs  Lab 05/25/19 1424 05/29/19 1258 05/30/19 1930  NA 141 142 141  K 4.2 4.3 3.6  CL 108 109 107  CO2 25 28 26   GLUCOSE 119* 99 102*  BUN 23 19 19   CREATININE 1.38* 1.31* 1.49*  CALCIUM 8.6* 8.7* 8.6*    GFR: Estimated Creatinine Clearance: 28.4 mL/min (A) (by C-G formula based on SCr of 1.49 mg/dL (H)). Liver Function Tests: Recent Labs  Lab 05/25/19 1424 05/29/19 1258  AST 21 20  ALT 17 11  ALKPHOS 80 88  BILITOT 0.4 0.3  PROT 5.8* 5.9*  ALBUMIN 3.1* 3.1*   No results for input(s): LIPASE, AMYLASE in the last 168 hours. No results for input(s): AMMONIA in the last 168 hours. Coagulation Profile: No results for input(s): INR, PROTIME in the last 168 hours. Cardiac Enzymes: No results for input(s): CKTOTAL, CKMB, CKMBINDEX, TROPONINI in the last 168 hours. BNP (last 3 results) No results for input(s): PROBNP in the last 8760 hours. HbA1C: No results for input(s): HGBA1C in the last 72 hours. CBG: No results for input(s): GLUCAP in the last 168 hours. Lipid Profile: No results for input(s): CHOL, HDL, LDLCALC, TRIG, CHOLHDL, LDLDIRECT in the last 72 hours. Thyroid Function Tests: Recent Labs    05/29/19 1258  TSH 2.738   Anemia Panel: No results for input(s): VITAMINB12, FOLATE, FERRITIN, TIBC, IRON, RETICCTPCT in the last 72 hours. Urine analysis:    Component Value Date/Time   BILIRUBINUR neg 03/19/2015 1159   PROTEINUR neg 03/19/2015 1159   UROBILINOGEN negative 03/19/2015 1159   NITRITE neg 03/19/2015 1159   LEUKOCYTESUR small (1+) (A) 03/19/2015 1159   Sepsis Labs: @LABRCNTIP (procalcitonin:4,lacticidven:4)  ) Recent Results (from the past 240 hour(s))  SARS CORONAVIRUS 2 (TAT 6-24 HRS) Nasopharyngeal Nasopharyngeal Swab     Status: None   Collection Time: 05/30/19  7:55 PM   Specimen: Nasopharyngeal Swab  Result Value Ref Range Status   SARS Coronavirus 2 NEGATIVE NEGATIVE Final    Comment: (NOTE) SARS-CoV-2 target nucleic acids are NOT DETECTED. The SARS-CoV-2 RNA is generally detectable in upper and lower respiratory specimens during the acute phase of infection. Negative results do not preclude SARS-CoV-2 infection, do not rule out co-infections  with other pathogens, and should not be used as the sole basis for treatment or other patient management decisions. Negative results must be combined with clinical observations, patient history, and epidemiological information. The expected result is Negative. Fact Sheet for Patients: SugarRoll.be Fact Sheet for Healthcare Providers: https://www.woods-mathews.com/ This test is not yet approved or cleared by the [place] FDA and  has been authorized for detection and/or diagnosis of SARS-CoV-2 by FDA under an Emergency Use Authorization (EUA). This EUA will remain  in effect (meaning this test can be used) for the duration of the COVID-19 declaration under Section 56 4(b)(1) of the Act, 21 U.S.C. section 360bbb-3(b)(1), unless the authorization is terminated or revoked sooner. Performed at [facility], [place] [address] [place], [place] [address]   Surgical pcr screen     Status: Abnormal   Collection Time: 05/31/19  4:41 AM   Specimen: Nasal Mucosa; Nasal Swab  Result Value Ref Range Status   MRSA, PCR NEGATIVE NEGATIVE Final   Staphylococcus aureus POSITIVE (A) NEGATIVE Final    Comment: (NOTE) The Xpert SA Assay (FDA approved for  NASAL specimens in patients 49 years of age and older), is one component of a comprehensive surveillance program. It is not intended to diagnose infection nor to guide or monitor treatment. Performed at [facility], [place] [address], [place], [place] [address]          Radiology Studies: DG Chest 1 View  Result Date: 05/30/2019 CLINICAL DATA:  Fall, no chest complaints EXAM: CHEST  1 VIEW COMPARISON:  CT 03/07/2019, PET-CT 03/21/2019 FINDINGS: Large right infrahilar soft tissue attenuation compatible with previously seen FDG avid tissue, most compatible with recurrent lung malignancy. Obscuration of the right hemidiaphragm may reflect chronic right effusion seen on comparison studies.  Additional atelectatic changes in the right lung base are likely present as well. Some scarring reticular changes is present in the left lung base as well, similar to comparison cross-sectional imaging. No new consolidative opacity is seen in the lungs. Cardiomediastinal contours are unremarkable for portable technique with a calcified, tortuous aorta. No acute osseous or soft tissue abnormality. Degenerative changes are present in the imaged spine and shoulders. Right IJ approach Port-A-Cath tip terminates at the level of the right atrium. IMPRESSION: Right perihilar attenuation compatible with recurrent malignancy seen on comparison PET-CT. Lung volume loss and chronic right effusion in the right lung base are likely similar to prior. No new acute cardiopulmonary abnormality or traumatic findings in the chest. Electronically Signed   By: Lovena Le M.D.   On: 05/30/2019 20:00   DG Hip Unilat With Pelvis 2-3 Views Left  Result Date: 05/30/2019 CLINICAL DATA:  Mechanical fall, left hip pain EXAM: DG HIP (WITH OR WITHOUT PELVIS) 2-3V LEFT COMPARISON:  PET-CT 03/21/2019 FINDINGS: Mildly foreshortened transcervical left femoral neck fracture with associated left hip effusion. Left femoral head remains normally located. Remaining bones of the pelvis are intact and congruent. Bilateral hip arthrosis as well as additional degenerative changes in the lower lumbar spine, SI joints and symphysis pubis are noted. Stable likely bone island seen in the posterior right acetabulum. Vascular calcium noted in the pelvis. Remaining soft tissues are unremarkable. IMPRESSION: Mildly foreshortened transcervical left femoral neck fracture with associated left hip effusion. Electronically Signed   By: Lovena Le M.D.   On: 05/30/2019 19:57        Scheduled Meds: . amLODipine  10 mg Oral QHS  . levothyroxine  75 mcg Oral QHS  . mirtazapine  7.5 mg Oral QHS   Continuous Infusions:   LOS: 1 day    Time spent: 35  minutes. Greater than 50% of this time was spent in direct contact with the patient, coordinating care and discussing relevant ongoing clinical issues, including plan for OR today, need for PT/OT recommendations following surgery for disposition planning.     Lelon Frohlich, MD [facility] Pager [phone]  If 7PM-7AM, please contact night-coverage [email or web address] Password [facility] 05/31/2019, 9:24 AM

## 2019-05-31 NOTE — Progress Notes (Signed)
Initial Nutrition Assessment  DOCUMENTATION CODES:   Not applicable  INTERVENTION:   Once diet is advanced, add:  -Ensure Enlive po BID, each supplement provides 350 kcal and 20 grams of protein -MVI with minerals daily  NUTRITION DIAGNOSIS:   Increased nutrient needs related to post-op healing as evidenced by estimated needs.  GOAL:   Patient will meet greater than or equal to 90% of their needs  MONITOR:   PO intake, Supplement acceptance, Diet advancement, Labs, Weight trends, Skin, I & O's  REASON FOR ASSESSMENT:   Consult Hip fracture protocol  ASSESSMENT:   Lisa [name] is a 86 y.o. female with history of stage III lung cancer, hypertension had a fall at a restaurant when patient was walking and tripped on the chair.  Denies hitting head.  Had pain of the left hip area and was brought to the ER.  Patient is being treated for lung cancer with chemo and had received 1 unit of PRBC yesterday for anemia.  Patient denies any chest pain or shortness of breath.  Pt admitted with lt hip fracture s/p mechanical fall.   Reviewed I/O's: -400 ml x 24 hours  UOP: 400 ml x 24 hours  Per orthopedics notes, plan surgery later today.   Spoke with pt and granddaughter [name], who is employed as a Immunologist at [employer]) at bedside. Pt very pleasant and eager to engage in conversation with this RD. She and her [name] report that pt has a had a decreased appetite and gradual weight loss over the past 2 years, secondary to chemotherapy treatments for lung cancer. Per [name], pt usually weighs about 180#, however, has gradually lost weight, but has stabilized over the past several months (now weighing around 147#). Per wt hx, pt has experienced a 3.4% wt loss over the past 3 months, which is not significant for time frame.   Pt shares that she tries to eat 3 meals per day, but breakfast is very difficult. [name] shares that pt used to be a habitual snacker, however, now only consumes meals  and/or Ensure Enlive supplements. Typical intake is coffee and Ensure for breakfast; grilled chicken and vegetables from a local [employer] for lunch, and [employer] from [employer]. Pt reports she has been eating out twice a day since she has retired. She does not follow a special diet at home, but consumes 2 Ensure Enlive supplements daily, per recommendations of RD at the [facility].   Discussed importance of good meal and supplement intake to promote healing. Pt delighted to know that the hospital carries Ensure Enlive supplements and eager to consume once diet is advanced.   Medications reviewed and include remeron.   Labs reviewed.   NUTRITION - FOCUSED PHYSICAL EXAM:    Most Recent Value  Orbital Region  No depletion  Upper Arm Region  Moderate depletion  Thoracic and Lumbar Region  No depletion  Buccal Region  No depletion  Temple Region  No depletion  Clavicle Bone Region  Mild depletion  Clavicle and Acromion Bone Region  Mild depletion  Scapular Bone Region  Mild depletion  Dorsal Hand  Mild depletion  Patellar Region  No depletion  Anterior Thigh Region  No depletion  Posterior Calf Region  No depletion  Edema (RD Assessment)  Mild  Hair  Reviewed  Eyes  Reviewed  Mouth  Reviewed  Skin  Reviewed  Nails  Reviewed       Diet Order:   Diet Order  Diet NPO time specified  Diet effective now              EDUCATION NEEDS:   Education needs have been addressed  Skin:  Skin Assessment: Reviewed RN Assessment  Last BM:  05/30/19  Height:   Ht Readings from Last 1 Encounters:  04/25/19 5\' 8"  (1.727 m)    Weight:   Wt Readings from Last 1 Encounters:  05/17/19 67.6 kg    Ideal Body Weight:  63.6 kg  BMI:  There is no height or weight on file to calculate BMI.  Estimated Nutritional Needs:   Kcal:  1700-1900  Protein:  80-95 grams  Fluid:  > 1.7 L    Loistine Chance, RD, LDN, [place] Registered Dietitian II Certified  Diabetes Care and Education Specialist Please refer to [facility] for RD and/or RD on-call/weekend/after hours pager

## 2019-06-01 ENCOUNTER — Encounter: Payer: Self-pay | Admitting: *Deleted

## 2019-06-01 ENCOUNTER — Inpatient Hospital Stay: Payer: Medicare Other

## 2019-06-01 LAB — TYPE AND SCREEN
ABO/RH(D): O NEG
Antibody Screen: NEGATIVE
Unit division: 0

## 2019-06-01 LAB — CBC
HCT: 30.3 % — ABNORMAL LOW (ref 36.0–46.0)
Hemoglobin: 9.6 g/dL — ABNORMAL LOW (ref 12.0–15.0)
MCH: 30.2 pg (ref 26.0–34.0)
MCHC: 31.7 g/dL (ref 30.0–36.0)
MCV: 95.3 fL (ref 80.0–100.0)
Platelets: 73 10*3/uL — ABNORMAL LOW (ref 150–400)
RBC: 3.18 MIL/uL — ABNORMAL LOW (ref 3.87–5.11)
RDW: 18.6 % — ABNORMAL HIGH (ref 11.5–15.5)
WBC: 15.5 10*3/uL — ABNORMAL HIGH (ref 4.0–10.5)
nRBC: 0 % (ref 0.0–0.2)

## 2019-06-01 LAB — BPAM RBC
Blood Product Expiration Date: 202103102359
ISSUE DATE / TIME: 202103031648
Unit Type and Rh: 9500

## 2019-06-01 LAB — BASIC METABOLIC PANEL
Anion gap: 8 (ref 5–15)
BUN: 22 mg/dL (ref 8–23)
CO2: 25 mmol/L (ref 22–32)
Calcium: 8.3 mg/dL — ABNORMAL LOW (ref 8.9–10.3)
Chloride: 108 mmol/L (ref 98–111)
Creatinine, Ser: 1.44 mg/dL — ABNORMAL HIGH (ref 0.44–1.00)
GFR calc Af Amer: 39 mL/min — ABNORMAL LOW (ref >60)
GFR calc non Af Amer: 33 mL/min — ABNORMAL LOW (ref >60)
Glucose, Bld: 135 mg/dL — ABNORMAL HIGH (ref 70–99)
Potassium: 4.4 mmol/L (ref 3.5–5.1)
Sodium: 141 mmol/L (ref 135–145)

## 2019-06-01 LAB — BPAM PLATELET PHERESIS
Blood Product Expiration Date: 202103052359
ISSUE DATE / TIME: 202103031446
Unit Type and Rh: 5100

## 2019-06-01 LAB — PREPARE PLATELET PHERESIS: Unit division: 0

## 2019-06-01 MED ORDER — ORAL CARE MOUTH RINSE
15.0000 mL | Freq: Two times a day (BID) | OROMUCOSAL | Status: DC
  Administered 2019-06-01 – 2019-06-03 (×5): 15 mL via OROMUCOSAL

## 2019-06-01 MED ORDER — RIVAROXABAN 20 MG PO TABS
20.0000 mg | ORAL_TABLET | Freq: Every day | ORAL | Status: DC
  Administered 2019-06-01 – 2019-06-02 (×2): 20 mg via ORAL
  Filled 2019-06-01 (×2): qty 1

## 2019-06-01 NOTE — Progress Notes (Signed)
PROGRESS NOTE    Lisa [name]  TFT:[id number] DOB: Dec 29, 1932 DOA: 05/30/2019 PCP: [name], MD     Brief Narrative:  86 year old woman admitted from home on 3/2 following a mechanical fall.  She denies loss of consciousness.  She had immediate pain of her left hip.  X-rays in the ED revealed a left hip fracture.  Orthopedics, Dr. Griffin Basil, is on board.  He is planning on taking her to surgery on 3/3 for left hip hemiarthroplasty.  Her past medical history significant for stage III lung cancer currently on chemotherapy, previous history of DVT on Xarelto which is currently on hold, hypertension and stage III chronic kidney disease.  She also has pancytopenia likely related to chemotherapy.   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 -Discussed with orthopedics, Dr. Griffin Basil.  -S/p left hip hemi arthroplasty 3/3. -Start PT/OT for dispo planning.  Stage III lung cancer -On chemotherapy, followed outpatient by Dr. Earlie Server.  Pancytopenia, chemotherapy-induced -She did receive 1 unit of PRBCs on 3/1 due to her anemia. -Hemoglobin is currently 10, optimized for surgery, platelet count is a little low at 61.  History of DVT -On Xarelto as an outpatient. -Per surgery ok to resume this evening.  Hypertension -Is currently well controlled. -Continue amlodipine.  Hypothyroidism -Check TSH, continue home dose of Synthroid.  Hyperlipidemia -Does not appear to be on any medications as an outpatient.  Hospital Delirium -Causing her to remove her wound vac. -Is pleseantly confused at bedside currently.   DVT prophylaxis: SCDs Code Status: Full code Family Communication:  Patient only Disposition Plan: pending PT/OT evaluations. Consultants:   Orthopedics, Dr. Griffin Basil  Procedures:   Left hip hemi arthroplasty 3/3  Antimicrobials:  Anti-infectives (From admission, onward)   Start     Dose/Rate Route Frequency Ordered Stop   05/31/19 2100  ceFAZolin (ANCEF) IVPB 2g/100 mL premix     2 g 200 mL/hr over 30 Minutes Intravenous Every 6 hours 05/31/19 1900 06/01/19 0245   05/31/19 1439  vancomycin (VANCOCIN) powder  Status:  Discontinued       As needed 05/31/19 1440 05/31/19 1726   05/31/19 1100  ceFAZolin (ANCEF) IVPB 2g/100 mL premix  Status:  Discontinued     2 g 200 mL/hr over 30 Minutes Intravenous To Short Stay 05/31/19 1048 05/31/19 1846       Subjective: In chair at bedside, pleasantly confused.  Objective: Vitals:   05/31/19 2016 05/31/19 2348 06/01/19 0329 06/01/19 0804  BP: 103/74 123/71 120/73 123/75  Pulse: 82 95 91 85  Resp: 17 20 17 16   Temp: (!) 97.5 F (36.4 C) (!) 97.4 F (36.3 C) (!) 97.5 F (36.4 C)   TempSrc: Oral Oral Oral   SpO2: 95% 96% 98% 100%    Intake/Output Summary (Last 24 hours) at 06/01/2019 1124 Last data filed at 06/01/2019 0300 Gross per 24 hour  Intake 3062.45 ml  Output 650 ml  Net 2412.45 ml   There were no vitals filed for this visit.  Examination:  General exam: Alert, awake, oriented x 2 Respiratory system: Clear to auscultation. Respiratory effort normal. Cardiovascular system:RRR. No murmurs, rubs, gallops. Gastrointestinal system:  Abdomen is nondistended, soft and nontender. No organomegaly or masses felt. Normal bowel sounds heard. Central nervous system: Alert and oriented. No focal neurological deficits. Extremities: No C/C/E, +pedal pulses. Psychiatry: Mood & affect appropriate.     Data Reviewed: I have personally reviewed following labs and imaging studies  CBC: Recent Labs  Lab 05/25/19 1424 05/25/19 1424 05/29/19 1258 05/30/19 1930 05/31/19 0802 05/31/19 1632  06/01/19 0400  WBC 3.7*  --  7.3 8.1 7.3  --  15.5*  NEUTROABS 1.2*  --  4.9 7.0 5.4  --   --   HGB 7.8*   < > 7.5* 10.1* 10.0* 8.8* 9.6*  HCT 25.1*   < > 24.5* 32.3* 32.3* 26.0* 30.3*  MCV 96.9  --  96.1 95.3 95.0  --  95.3  PLT 56*  --  66* 65* 61*  --  73*   < > = values in this interval not displayed.   Basic Metabolic Panel: Recent Labs  Lab 05/25/19 1424 05/25/19 1424 05/29/19 1258 05/30/19 1930 05/31/19 0802 05/31/19 1632 06/01/19 0400  NA 141   < > 142 141 140 141 141  K 4.2   < > 4.3 3.6 3.5 4.0 4.4  CL 108   < > 109 107 104 105 108  CO2 25  --  28 26 28   --  25  GLUCOSE 119*   < > 99 102* 87 91 135*  BUN 23   < > 19 19 19 17 22   CREATININE 1.38*   < > 1.31* 1.49* 1.34* 1.10* 1.44*  CALCIUM 8.6*  --  8.7* 8.6* 8.8*  --  8.3*   < > = values in this interval not displayed.   GFR: CrCl cannot be calculated (Unknown ideal weight.). Liver Function Tests: Recent Labs  Lab 05/25/19 1424 05/29/19 1258 05/31/19 0802  AST 21 20 27   ALT 17 11 16   ALKPHOS 80 88 88  BILITOT 0.4 0.3 0.5  PROT 5.8* 5.9* 5.6*  ALBUMIN 3.1* 3.1* 2.9*   No results for input(s): LIPASE, AMYLASE in the last 168 hours. No results for input(s): AMMONIA in the last 168 hours. Coagulation Profile: No results for input(s): INR, PROTIME in the last 168 hours. Cardiac Enzymes: No results for input(s): CKTOTAL, CKMB, CKMBINDEX, TROPONINI in the last 168 hours. BNP (last 3 results) No results for input(s): PROBNP in the last 8760 hours. HbA1C: No results for input(s): HGBA1C in the last 72 hours. CBG: No results for input(s): GLUCAP in the last 168 hours. Lipid Profile: No results for input(s): CHOL, HDL, LDLCALC, TRIG, CHOLHDL, LDLDIRECT in the last 72 hours. Thyroid Function Tests: Recent Labs    05/31/19 1021  TSH 8.507*   Anemia Panel: No results for input(s): VITAMINB12, FOLATE, FERRITIN, TIBC, IRON, RETICCTPCT in the last 72 hours. Urine analysis:    Component Value Date/Time    BILIRUBINUR neg 03/19/2015 1159   PROTEINUR neg 03/19/2015 1159   UROBILINOGEN negative 03/19/2015 1159   NITRITE neg 03/19/2015 1159   LEUKOCYTESUR small (1+) (A) 03/19/2015 1159   Sepsis Labs: @LABRCNTIP (procalcitonin:4,lacticidven:4)  ) Recent Results (from the past 240 hour(s))  SARS CORONAVIRUS 2 (TAT 6-24 HRS) Nasopharyngeal Nasopharyngeal Swab     Status: None   Collection Time: 05/30/19  7:55 PM   Specimen: Nasopharyngeal Swab  Result Value Ref Range Status   SARS Coronavirus 2 NEGATIVE NEGATIVE Final    Comment: (NOTE) SARS-CoV-2 target nucleic acids are NOT DETECTED. The SARS-CoV-2 RNA is generally detectable in upper and  lower respiratory specimens during the acute phase of infection. Negative results do not preclude SARS-CoV-2 infection, do not rule out co-infections with other pathogens, and should not be used as the sole basis for treatment or other patient management decisions. Negative results must be combined with clinical observations, patient history, and epidemiological information. The expected result is Negative. Fact Sheet for Patients: SugarRoll.be Fact Sheet for Healthcare Providers: https://www.woods-mathews.com/ This test is not yet approved or cleared by the [place] FDA and  has been authorized for detection and/or diagnosis of SARS-CoV-2 by FDA under an Emergency Use Authorization (EUA). This EUA will remain  in effect (meaning this test can be used) for the duration of the COVID-19 declaration under Section 56 4(b)(1) of the Act, 21 U.S.C. section 360bbb-3(b)(1), unless the authorization is terminated or revoked sooner. Performed at [facility], [place] [address], [place], [place] [address]   Surgical pcr screen     Status: Abnormal   Collection Time: 05/31/19  4:41 AM   Specimen: Nasal Mucosa; Nasal Swab  Result Value Ref Range Status   MRSA, PCR NEGATIVE NEGATIVE Final   Staphylococcus  aureus POSITIVE (A) NEGATIVE Final    Comment: (NOTE) The Xpert SA Assay (FDA approved for NASAL specimens in patients 2 years of age and older), is one component of a comprehensive surveillance program. It is not intended to diagnose infection nor to guide or monitor treatment. Performed at [facility], [place] [address] [place], [place] [address]          Radiology Studies: DG Chest 1 View  Result Date: 05/30/2019 CLINICAL DATA:  Fall, no chest complaints EXAM: CHEST  1 VIEW COMPARISON:  CT 03/07/2019, PET-CT 03/21/2019 FINDINGS: Large right infrahilar soft tissue attenuation compatible with previously seen FDG avid tissue, most compatible with recurrent lung malignancy. Obscuration of the right hemidiaphragm may reflect chronic right effusion seen on comparison studies. Additional atelectatic changes in the right lung base are likely present as well. Some scarring reticular changes is present in the left lung base as well, similar to comparison cross-sectional imaging. No new consolidative opacity is seen in the lungs. Cardiomediastinal contours are unremarkable for portable technique with a calcified, tortuous aorta. No acute osseous or soft tissue abnormality. Degenerative changes are present in the imaged spine and shoulders. Right IJ approach Port-A-Cath tip terminates at the level of the right atrium. IMPRESSION: Right perihilar attenuation compatible with recurrent malignancy seen on comparison PET-CT. Lung volume loss and chronic right effusion in the right lung base are likely similar to prior. No new acute cardiopulmonary abnormality or traumatic findings in the chest. Electronically Signed   By: Lovena Le M.D.   On: 05/30/2019 20:00   DG Hip Port Unilat With Pelvis 1V Left  Result Date: 05/31/2019 CLINICAL DATA:  86 year old female with left hip arthroplasty. EXAM: DG HIP (WITH OR WITHOUT PELVIS) 1V PORT LEFT COMPARISON:  Left knee radiograph dated 05/30/2019. FINDINGS:  There is a left hip arthroplasty. The arthroplasty appears intact. There is no acute fracture or dislocation. The bones are osteopenic. Postsurgical changes in the soft tissues of the left hip. IMPRESSION: Left hip arthroplasty.  No immediate complication. Electronically Signed   By: Anner Crete M.D.   On: 05/31/2019 18:31   DG Hip Unilat With Pelvis 2-3 Views Left  Result Date: 05/30/2019 CLINICAL DATA:  Mechanical fall, left hip pain EXAM: DG HIP (WITH OR WITHOUT PELVIS) 2-3V LEFT COMPARISON:  PET-CT 03/21/2019 FINDINGS: Mildly foreshortened transcervical left femoral  neck fracture with associated left hip effusion. Left femoral head remains normally located. Remaining bones of the pelvis are intact and congruent. Bilateral hip arthrosis as well as additional degenerative changes in the lower lumbar spine, SI joints and symphysis pubis are noted. Stable likely bone island seen in the posterior right acetabulum. Vascular calcium noted in the pelvis. Remaining soft tissues are unremarkable. IMPRESSION: Mildly foreshortened transcervical left femoral neck fracture with associated left hip effusion. Electronically Signed   By: Lovena Le M.D.   On: 05/30/2019 19:57        Scheduled Meds: . sodium chloride   Intravenous Once  . acetaminophen  1,000 mg Oral Q8H  . amLODipine  10 mg Oral QHS  . docusate sodium  100 mg Oral BID  . feeding supplement (ENSURE ENLIVE)  237 mL Oral BID BM  . levothyroxine  75 mcg Oral QHS  . mouth rinse  15 mL Mouth Rinse BID  . mirtazapine  7.5 mg Oral QHS  . multivitamin with minerals  1 tablet Oral Daily  . mupirocin ointment  1 application Topical BID  . rivaroxaban  20 mg Oral Q supper   Continuous Infusions: . lactated ringers    . methocarbamol (ROBAXIN) IV       LOS: 2 days    Time spent: 25 minutes. Greater than 50% of this time was spent in direct contact with the patient, coordinating care and discussing relevant ongoing clinical issues,  including need for PT/OT recommendations following surgery for disposition planning.     Lelon Frohlich, MD [facility] Pager [phone]  If 7PM-7AM, please contact night-coverage [email or web address] Password [facility] 06/01/2019, 11:24 AM

## 2019-06-01 NOTE — Plan of Care (Signed)

## 2019-06-01 NOTE — Anesthesia Postprocedure Evaluation (Signed)
Anesthesia Post Note  Patient: Lisa [name]  Procedure(s) Performed: LEFT HIP HEMIARTHROPLASTY (Left Hip)     Patient location during evaluation: PACU Anesthesia Type: General Level of consciousness: awake and alert Pain management: pain level controlled Vital Signs Assessment: post-procedure vital signs reviewed and stable Respiratory status: spontaneous breathing, nonlabored ventilation, respiratory function stable and patient connected to nasal cannula oxygen Cardiovascular status: blood pressure returned to baseline and stable Postop Assessment: no apparent nausea or vomiting Anesthetic complications: no    Last Vitals:  Vitals:   06/01/19 0329 06/01/19 0804  BP: 120/73 123/75  Pulse: 91 85  Resp: 17 16  Temp: (!) 36.4 C   SpO2: 98% 100%    Last Pain:  Vitals:   06/01/19 0600  TempSrc:   PainSc: 0-No pain                 Ameli Sangiovanni S

## 2019-06-01 NOTE — Progress Notes (Signed)
   ORTHOPAEDIC PROGRESS NOTE  s/p Procedure(s): LEFT HIP HEMIARTHROPLASTY on 05/31/2019 by Dr. Griffin Basil  SUBJECTIVE: Patient is pleasantly confused at the bedside. She had some confusion in the night and pulled her wound vac dressing off. Nursing staff put on a new Praveena dressing. She reports minimal pain about operative site. No chest pain. No SOB. No nausea/vomiting. No other complaints.   OBJECTIVE: PE: Left lower extremity: Praveena dressing in place with good seal, abduction pillow in place, leg lengths equal, intact EHL/TA/GSC, warm well perfused foot  Vitals:   06/01/19 0329 06/01/19 0804  BP: 120/73 123/75  Pulse: 91 85  Resp: 17 16  Temp: (!) 97.5 F (36.4 C)   SpO2: 98% 100%   IMAGING: Stable post-op imaging.   ASSESSMENT: Lisa [name] is a 86 y.o. female doing reasonable postoperatively. POD#1  PLAN: Weightbearing: WBAT LLE Insicional and dressing care: Wound VAC should be left in place Orthopedic device(s): Wound VAC Showering: Post-op day #2 with assistance VTE prophylaxis: Patient can restart baseline oral anticoagulant, Xarelto, this evening 06/01/2019 Pain control: PRN pain medications, preferring oral medications. Minimize narcotics as able Follow - up plan: 2 weeks in office for incision check, suture removal, repeat X-ray Contact information: Noemi Chapel PA-C, Dr. Ophelia Charter Dispo: TBD, pending PT evaluations and medical stablization  Noemi Chapel, PA-C 06/01/2019

## 2019-06-01 NOTE — Progress Notes (Signed)
Pt had some confusion throughout the night and pulled her wound vac dressing off. Wound vac has had no output since placement yesterday. Incision site is clean, dry, and intact. MD notified for further instruction. Will continue to monitor site closely.

## 2019-06-01 NOTE — TOC Initial Note (Addendum)
Transition of Care [facility]) - Initial/Assessment Note    Patient Details  Name: Lisa [name] MRN: [id number] Date of Birth: [birth year]  [facility]) CM/SW Contact:    Sharin Mons, RN Phone Number: 06/01/2019, 2:29 PM  Clinical Narrative:                   Admitted fall at [facility] with left hip fx s/p left hip hemiarthroplasty. PMHx: lung CA, HTN, HLD Pt from home with husband. PTA independent with ADL's. NCM spoke with pt @ bedside regarding PT's eval/recommendations : SNF;Supervision/Assistance - 24 hour. Pt declined SNF. Agreeable to home health services.States family will provide 24/7 supervision assistance. Choice provided for home health services pending MD's order and completed F2F. Pt selected [facility]. Referral made and accepted.  TOC team will continue to monitor for needs....  06/01/2019 @1625  NCM received call from pt's granddaughter Lisa Sarna stating pt with intermittent periods of confusion. Grandfather will not be care for pt with current physical need. States @ d/c pt will not have 24/7 assistance /supervision and would like for NCM to work pt up for SNF placement. States will reinforce need to pt. Lisa [name] (Gda)/HCPOA    [phone]     NCM will follow up with SNF work up in am ....  Expected Discharge Plan: [place] [facility] [place] [facility](Declined SNF placement) Barriers to Discharge: Continued Medical Work up   Patient Goals and CMS Choice     Choice offered to / list presented to : Patient  Expected Discharge Plan and Services Expected Discharge Plan: [facility](Declined SNF placement)   Discharge Planning Services: CM Consult Post Acute Care Choice: [place] arrangements for the past 2 months: Single Family Home                 DME Arranged: 3-N-1, [place] rolling DME Agency: [employer] Date DME Agency Contacted: 06/01/19 Time DME Agency Contacted: 46 Representative spoke with at DME Agency: [facility]  Arranged: PT, OT [place] Agency: [place] ([place]) Date [place]: 06/01/19 Time [place]: 62 Representative spoke with at [place]: [place]  Prior Living Arrangements/Services Living arrangements for the past 2 months: [place] with:: Spouse   Do you feel safe going back to the place where you live?: Yes      Need for Family Participation in Patient Care: Yes (Comment) Care giver support system in place?: Yes (comment) Current home services: Home PT, Home OT Criminal Activity/Legal Involvement Pertinent to Current Situation/Hospitalization: No - Comment as needed  Activities of Daily Living Home Assistive Devices/Equipment: None ADL Screening (condition at time of admission) Patient's cognitive ability adequate to safely complete daily activities?: Yes Is the patient deaf or have difficulty hearing?: No Does the patient have difficulty seeing, even when wearing glasses/contacts?: No Does the patient have difficulty concentrating, remembering, or making decisions?: No Patient able to express need for assistance with ADLs?: Yes Does the patient have difficulty dressing or bathing?: No Independently performs ADLs?: Yes (appropriate for developmental age) Does the patient have difficulty walking or climbing stairs?: No Weakness of Legs: None Weakness of Arms/Hands: None  Permission Sought/Granted                  Emotional Assessment Appearance:: Appears stated age Attitude/Demeanor/Rapport: Engaged Affect (typically observed): Accepting Orientation: : Oriented to Place, Oriented to Self, Oriented to  Time, Oriented to Situation Alcohol / Substance Use: Not Applicable Psych Involvement: No (  comment)  Admission diagnosis:  Closed fracture of left hip, initial encounter ([place]) [S72.002A] Fall, initial encounter [W19.XXXA] Closed left hip fracture, initial encounter [facility]) [S72.002A] Patient Active Problem List   Diagnosis  Date Noted  . Fall   . Closed fracture of left hip ([place]) 05/30/2019  . Non-small cell carcinoma of lung, stage 4, right ([place]) 03/27/2019  . Port-A-Cath in place 09/21/2018  . Sore throat 08/03/2018  . Healthcare maintenance 07/27/2018  . Hypercalcemia 04/28/2018  . DVT (deep venous thrombosis) ([place]) 04/28/2018  . Dehydration 04/28/2018  . Hypokalemia 04/28/2018  . Encounter for antineoplastic immunotherapy 02/16/2018  . Stage III squamous cell carcinoma of right lung ([place]) 12/02/2017  . Encounter for antineoplastic chemotherapy 12/02/2017  . Goals of care, counseling/discussion 12/02/2017  . Vitamin D deficiency 06/19/2016  . Elevated serum creatinine 06/19/2016  . Advance care planning 06/08/2014  . Medicare annual wellness visit, subsequent 05/19/2012  . Dysuria 11/26/2010  . Bilateral leg edema 11/26/2010  . OBESITY 12/15/2006  . Hypothyroidism 10/29/1995  . Essential hypertension 10/29/1995  . HLD (hyperlipidemia) 01/29/1991   PCP:  [name], MD Pharmacy:   [facility] [place], [place] [address] Phone: [phone] Fax: [phone]  [place], [place], [place] [address], [place] [address] Phone: [phone] Fax: [phone]  [facility] [place], [place] [address] Phone: [phone] Fax: [phone]     Social Determinants of Health (SDOH) Interventions    Readmission Risk Interventions No flowsheet data found.

## 2019-06-01 NOTE — Progress Notes (Signed)
ANTICOAGULATION CONSULT NOTE - Initial Consult  Pharmacy Consult for  H/o DVT (04/28/2018) Indication:  Resume PTA Xarelto  Allergies  Allergen Reactions  . Atorvastatin Other (See Comments)    CAUSED BURNING AND TINGLING IN LEGS  . Celebrex [Celecoxib] Swelling and Rash    SWELLING REACTION UNSPECIFIED   . Cholecalciferol Anxiety and Other (See Comments)    Nervous, crying with high dose replacement  . Simvastatin Other (See Comments)    Muscle pain  . Tape Other (See Comments)    SKIN IS SENSITIVE!!    Vital Signs: Temp: 97.5 F (36.4 C) (03/04 0329) Temp Source: Oral (03/04 0329) BP: 123/75 (03/04 0804) Pulse Rate: 85 (03/04 0804)  Labs: Recent Labs    05/30/19 1930 05/30/19 1930 05/31/19 0802 05/31/19 0802 05/31/19 1632 06/01/19 0400  HGB 10.1*   < > 10.0*   < > 8.8* 9.6*  HCT 32.3*   < > 32.3*  --  26.0* 30.3*  PLT 65*  --  61*  --   --  73*  CREATININE 1.49*   < > 1.34*  --  1.10* 1.44*   < > = values in this interval not displayed.    CrCl cannot be calculated (Unknown ideal weight.).   Medical History: Past Medical History:  Diagnosis Date  . Diverticulosis   . Headache   . Hemorrhoids    internal and external  . Hyperlipidemia 01/29/1991  . Hypertension 10/29/1995  . Hypothyroidism 10/29/1995  . Lung mass    right middle lobe  . Melanoma ([place])    h/o, local excision. no chemo or rady.   . Skin cancer (melanoma) ([place])   . Tubular adenoma of colon   . Wears dentures     Medications:  Medications Prior to Admission  Medication Sig Dispense Refill Last Dose  . albuterol (VENTOLIN HFA) 108 (90 Base) MCG/ACT inhaler Inhale 2 puffs into the lungs every 6 (six) hours as needed for wheezing or shortness of breath. 6.7 g 0 unk at unk  . amLODipine (NORVASC) 10 MG tablet Take 1 tablet by mouth once daily (Patient taking differently: Take 10 mg by mouth at bedtime. ) 90 tablet 2 05/29/2019 at pm  . Cholecalciferol (VITAMIN D3) 50 MCG (2000 UT) TABS Take  2,000 Units by mouth 3 (three) times a week.   Past Week at Unknown time  . Coenzyme Q10 100 MG capsule Take 100 mg by mouth daily.    05/29/2019 at Unknown time  . fluticasone (FLONASE) 50 MCG/ACT nasal spray USE 2 SPRAYS IN EACH NOSTRIL DAILY AS NEEDED FOR ALLERGIES (Patient taking differently: Place 2 sprays into both nostrils daily as needed for allergies or rhinitis. ) 48 g 3 unk at unk  . levothyroxine (SYNTHROID, LEVOTHROID) 75 MCG tablet Take 1 tablet by mouth once daily (Patient taking differently: Take 75 mcg by mouth at bedtime. ) 90 tablet 3 05/29/2019 at pm  . lidocaine-prilocaine (EMLA) cream Apply 1 application topically as needed. (Patient taking differently: Apply 1 application topically as needed (to numb). ) 30 g 0 unk at unk  . loratadine (CLARITIN) 10 MG tablet Take 1 tablet (10 mg total) by mouth daily as needed for allergies or rhinitis.   unk at unk  . mirtazapine (REMERON) 7.5 MG tablet Take 1 tablet (7.5 mg total) by mouth at bedtime. 30 tablet 1 05/29/2019 at pm  . XARELTO 20 MG TABS tablet TAKE 1 TABLET BY MOUTH ONCE DAILY WITH SUPPER (Patient taking differently: Take 20 mg  by mouth daily after supper. ) 30 tablet 0 05/29/2019 at 2000  . docusate sodium (COLACE) 100 MG capsule Take 100 mg by mouth daily as needed for mild constipation.   On hold at On hold  . prochlorperazine (COMPAZINE) 10 MG tablet Take 1 tablet (10 mg total) by mouth every 6 (six) hours as needed for nausea or vomiting. (Patient not taking: Reported on 05/30/2019) 30 tablet 0 Not Taking at Unknown time  . sennosides-docusate sodium (SENOKOT-S) 8.6-50 MG tablet Take 1 tablet by mouth daily as needed for constipation.   On hold at On hold    Assessment: 86 y.o female presented to ED 05/30/19, left hip fracture post a fall at a restaurant  tripped on a chair.  She was on  Xarelto 20mg  daily w/supper PTA for DVT history (04/28/18). Xarelto was held for hip surgery.  Last dose of Xarelto PTA was on 05/29/19.   Now s/p L  THA done on 05/31/19.  No CP or SOB, no bleeding reported. Orthopedic PA ordered pharmacy consult to resume PTA anticoagulant this evening if stable to do so.  No bleeding reported.  Dr. Rich Fuchs post op note indicated 3/3 as plan for DVT prophylaxis can restart oral baseline anticoagulants the evening of 06/01/2019.  H/H low stable hgb 9.6, pltc low stable 65>>73k,  No bleeding reported.  S/p transfusions 3/1, 05/31/19.     Goal of Therapy:  Monitor platelets by anticoagulation protocol: Yes   Plan:  Resume Xarelto 20mg  daily w/supper tonight.  Hold Xarelto if bleeding or if patient not stable.  RN to call MD if patient not stable.    Nicole Cella, RPh Clinical Pharmacist [phone] Please check AMION for all [facility] phone numbers After 10:00 PM, call [place] [phone] 06/01/2019,9:30 AM

## 2019-06-01 NOTE — Evaluation (Signed)
Physical Therapy Evaluation Patient Details Name: Lisa [name] MRN: [id number] DOB: Apr 21, 1932 Today's Date: 06/01/2019   History of Present Illness  86 yo admitted after fall at restaurant with left hip fx s/p left hip hemiarthroplasty. PMHx: lung CA, HTN, HLD  Clinical Impression  Pt very pleasant although confused and continually stating she needs to get back to her room because this is not her bed. Pt moving surprisingly well given cognition and surgery but unable to recall or maintain precautions at any point in time during session. Pt with decreased strength, transfers, gait, mobility and cognition who will benefit from acute therapy to maximize independence and function to decrease burden of care.      Follow Up Recommendations SNF;Supervision/Assistance - 24 hour    Equipment Recommendations  Rolling walker with 5" wheels;3in1 (PT)    Recommendations for Other Services OT consult     Precautions / Restrictions Precautions Precautions: Posterior Hip;Fall Precaution Booklet Issued: Yes (comment) Restrictions Weight Bearing Restrictions: Yes LLE Weight Bearing: Weight bearing as tolerated      Mobility  Bed Mobility Overal bed mobility: Needs Assistance Bed Mobility: Supine to Sit     Supine to sit: Min assist;HOB elevated     General bed mobility comments: HOb 30 degrees with increased time to pivot to left with min assist to maintain precautions and cues for sequence  Transfers Overall transfer level: Needs assistance   Transfers: Sit to/from Stand Sit to Stand: Min assist         General transfer comment: cues for LLE positioning, precautions, hand placement and assist to rise from bed and toilet  Ambulation/Gait Ambulation/Gait assistance: Min assist Gait Distance (Feet): 30 Feet Assistive device: Rolling walker (2 wheeled) Gait Pattern/deviations: Step-to pattern;Decreased stride length   Gait velocity interpretation: <1.8 ft/sec, indicate of risk  for recurrent falls General Gait Details: pt with cues for sequence with frequent assist to maintain precautions particularly turning toe in with directional change and tendency to scissor with gait. Pt walked 15' then additional 30' after seated rest  Stairs            Wheelchair Mobility    Modified Rankin (Stroke Patients Only)       Balance Overall balance assessment: Needs assistance   Sitting balance-Leahy Scale: Fair Sitting balance - Comments: pt able to sit eOB and at toilet without assist   Standing balance support: Bilateral upper extremity supported Standing balance-Leahy Scale: Fair Standing balance comment: able to wash hands without support, bil UE support on RW for gait                             Pertinent Vitals/Pain Pain Assessment: No/denies pain    Home Living Family/patient expects to be discharged to:: Skilled nursing facility Living Arrangements: Spouse/significant other;Other (Comment) Available Help at Discharge: Family;Available PRN/intermittently Type of Home: House Home Access: Stairs to enter   [employer] of Steps: 3 Home Layout: One level Home Equipment: Cane - single point      Prior Function Level of Independence: Needs assistance   Gait / Transfers Assistance Needed: was walking with cane  ADL's / Homemaking Assistance Needed: pt was performing ADLs family assist for IADLs grandaughters manage finances  Comments: Pt lives with spouse and son (works at a [employer]). Grandaugther [name] is CRNA at [employer] and very involved in care. Family provided home setup and PLOF     Hand Dominance  Extremity/Trunk Assessment   Upper Extremity Assessment Upper Extremity Assessment: Generalized weakness    Lower Extremity Assessment Lower Extremity Assessment: Generalized weakness    Cervical / Trunk Assessment Cervical / Trunk Assessment: Kyphotic  Communication   Communication: No difficulties   Cognition Arousal/Alertness: Awake/alert Behavior During Therapy: WFL for tasks assessed/performed Overall Cognitive Status: History of cognitive impairments - at baseline                                 General Comments: pt aware she fell and had surgery, not oriented to place or time continuing to state she needs to move back to her room despite education she is in her room. Discussed with grandaughter who reports baseline awareness of person and place is normal since chemo in 2019      General Comments      Exercises     Assessment/Plan    PT Assessment Patient needs continued PT services  PT Problem List Decreased strength;Decreased mobility;Decreased safety awareness;Decreased coordination;Decreased activity tolerance;Decreased balance;Decreased cognition;Decreased knowledge of use of DME;Decreased knowledge of precautions       PT Treatment Interventions DME instruction;Therapeutic exercise;Gait training;Balance training;Functional mobility training;Therapeutic activities;Patient/family education;Cognitive remediation    PT Goals (Current goals can be found in the Care Plan section)  Acute Rehab PT Goals Patient Stated Goal: be able to return home PT Goal Formulation: With patient/family Time For Goal Achievement: 06/15/19 Potential to Achieve Goals: Fair    Frequency Min 4X/week   Barriers to discharge Decreased caregiver support son works and spouse unable to provide sufficient assist, family working on [employer]               AM-PAC PT "6 Clicks" Mobility  Outcome Measure Help needed turning from your back to your side while in a flat bed without using bedrails?: A Little Help needed moving from lying on your back to sitting on the side of a flat bed without using bedrails?: A Little Help needed moving to and from a bed to a chair (including a wheelchair)?: A Little Help needed standing up from a chair using your arms  (e.g., wheelchair or bedside chair)?: A Little Help needed to walk in hospital room?: A Little Help needed climbing 3-5 steps with a railing? : A Lot 6 Click Score: 17    End of Session Equipment Utilized During Treatment: Gait belt Activity Tolerance: Patient tolerated treatment well Patient left: in chair;with call bell/phone within reach;with chair alarm set;Other (comment)(hip abduction pillow) Nurse Communication: Mobility status;Precautions;Weight bearing status PT Visit Diagnosis: Other abnormalities of gait and mobility (R26.89);Difficulty in walking, not elsewhere classified (R26.2);History of falling (Z91.81)    Time: 6203-5597 PT Time Calculation (min) (ACUTE ONLY): 21 min   Charges:   PT Evaluation $PT Eval Moderate Complexity: 1 Mod          Marieann Zipp P, PT Acute Rehabilitation Services Pager: [phone] Office: [place] 06/01/2019, 1:13 PM

## 2019-06-02 LAB — CREATININE, SERUM
Creatinine, Ser: 1.39 mg/dL — ABNORMAL HIGH (ref 0.44–1.00)
GFR calc Af Amer: 40 mL/min — ABNORMAL LOW (ref >60)
GFR calc non Af Amer: 35 mL/min — ABNORMAL LOW (ref >60)

## 2019-06-02 LAB — BASIC METABOLIC PANEL
Anion gap: 9 (ref 5–15)
BUN: 22 mg/dL (ref 8–23)
CO2: 27 mmol/L (ref 22–32)
Calcium: 8.6 mg/dL — ABNORMAL LOW (ref 8.9–10.3)
Chloride: 109 mmol/L (ref 98–111)
Creatinine, Ser: 1.41 mg/dL — ABNORMAL HIGH (ref 0.44–1.00)
GFR calc Af Amer: 40 mL/min — ABNORMAL LOW (ref >60)
GFR calc non Af Amer: 34 mL/min — ABNORMAL LOW (ref >60)
Glucose, Bld: 125 mg/dL — ABNORMAL HIGH (ref 70–99)
Potassium: 4.2 mmol/L (ref 3.5–5.1)
Sodium: 145 mmol/L (ref 135–145)

## 2019-06-02 LAB — CBC
HCT: 29.1 % — ABNORMAL LOW (ref 36.0–46.0)
Hemoglobin: 9.1 g/dL — ABNORMAL LOW (ref 12.0–15.0)
MCH: 30.3 pg (ref 26.0–34.0)
MCHC: 31.3 g/dL (ref 30.0–36.0)
MCV: 97 fL (ref 80.0–100.0)
Platelets: 60 10*3/uL — ABNORMAL LOW (ref 150–400)
RBC: 3 MIL/uL — ABNORMAL LOW (ref 3.87–5.11)
RDW: 19 % — ABNORMAL HIGH (ref 11.5–15.5)
WBC: 7.9 10*3/uL (ref 4.0–10.5)
nRBC: 0 % (ref 0.0–0.2)

## 2019-06-02 LAB — SURGICAL PATHOLOGY

## 2019-06-02 MED ORDER — OXYCODONE HCL 5 MG PO TABS: ORAL_TABLET | ORAL | 0 refills | Status: AC

## 2019-06-02 MED ORDER — ADULT MULTIVITAMIN W/MINERALS CH: 1.0000 | ORAL_TABLET | Freq: Every day | ORAL | Status: AC

## 2019-06-02 MED ORDER — ACETAMINOPHEN 325 MG PO TABS
650.0000 mg | ORAL_TABLET | Freq: Four times a day (QID) | ORAL | Status: DC | PRN
  Administered 2019-06-02 – 2019-06-03 (×3): 650 mg via ORAL
  Filled 2019-06-02 (×3): qty 2

## 2019-06-02 NOTE — NC FL2 (Signed)
[place] LEVEL OF CARE SCREENING TOOL     IDENTIFICATION  Patient Name: Lisa [name] Birthdate: 27-Aug-1932 Sex: female Admission Date (Current Location): 05/30/2019  [facility] and [place] Number:  Herbalist and Address:  [facility] [place] [place] [address], [place], [place] [address]      Provider Number: [id number]  Attending Physician Name and Address:  Isaac Bliss, [place]  Relative Name and Phone Number:  Shelly Flatten (RWE)[phone]    Current Level of Care: Hospital Recommended Level of Care: [place] Prior Approval Number:    Date Approved/Denied:   PASRR Number: [id number]  Discharge Plan:      Current Diagnoses: Patient Active Problem List   Diagnosis Date Noted  . Fall   . Closed fracture of left hip ([place]) 05/30/2019  . Non-small cell carcinoma of lung, stage 4, right ([place]) 03/27/2019  . Port-A-Cath in place 09/21/2018  . Sore throat 08/03/2018  . Healthcare maintenance 07/27/2018  . Hypercalcemia 04/28/2018  . DVT (deep venous thrombosis) ([place]) 04/28/2018  . Dehydration 04/28/2018  . Hypokalemia 04/28/2018  . Encounter for antineoplastic immunotherapy 02/16/2018  . Stage III squamous cell carcinoma of right lung ([place]) 12/02/2017  . Encounter for antineoplastic chemotherapy 12/02/2017  . Goals of care, counseling/discussion 12/02/2017  . Vitamin D deficiency 06/19/2016  . Elevated serum creatinine 06/19/2016  . Advance care planning 06/08/2014  . Medicare annual wellness visit, subsequent 05/19/2012  . Dysuria 11/26/2010  . Bilateral leg edema 11/26/2010  . OBESITY 12/15/2006  . Hypothyroidism 10/29/1995  . Essential hypertension 10/29/1995  . HLD (hyperlipidemia) 01/29/1991    Orientation RESPIRATION BLADDER Height & Weight     Self, Time, Situation, Place(intermittent confused periods)  O2(2l/min [place]) Continent Weight: 70 kg Height:  5\' 7"  (170.2 cm)  BEHAVIORAL SYMPTOMS/MOOD  NEUROLOGICAL BOWEL NUTRITION STATUS      Continent Diet(refer to d/c summary)  AMBULATORY STATUS COMMUNICATION OF NEEDS Skin   Extensive Assist Verbally Other (Comment)(3/3 LEFT HIP HEMIARTHROPLASTY (Left Hip), Prevena wound vac)                       Personal Care Assistance Level of Assistance  Bathing, Feeding, Dressing Bathing Assistance: Maximum assistance Feeding assistance: Independent Dressing Assistance: Maximum assistance     Functional Limitations Info  Sight, Hearing, Speech Sight Info: Adequate Hearing Info: Adequate Speech Info: Adequate    SPECIAL CARE FACTORS FREQUENCY  PT (By licensed PT), OT (By licensed OT)     PT Frequency: 5x / week, evaluate and treat OT Frequency: 5x / week , evaluate and treat            Contractures Contractures Info: Not present    Additional Factors Info  Code Status, Allergies Code Status Info: full code Allergies Info: Atorvastatin, Celebrex , Cholecalciferol, Simvastatin, Tape           Current Medications (06/02/2019):  This is the current hospital active medication list Current Facility-Administered Medications  Medication Dose Route Frequency Provider Last Rate Last Admin  . 0.9 %  sodium chloride infusion (Manually program via Guardrails IV Fluids)   Intravenous Once Inda Coke, CRNA      . albuterol (PROVENTIL) (2.5 MG/3ML) 0.083% nebulizer solution 3 mL  3 mL Inhalation Q6H PRN McBane, Maylene Roes, PA-C      . amLODipine (NORVASC) tablet 10 mg  10 mg Oral QHS Ethelda Chick, PA-C   10 mg at 06/01/19 2121  .  bisacodyl (DULCOLAX) EC tablet 5 mg  5 mg Oral Daily PRN McBane, Maylene Roes, PA-C      . docusate sodium (COLACE) capsule 100 mg  100 mg Oral BID Ethelda Chick, PA-C   100 mg at 06/01/19 2122  . feeding supplement (ENSURE ENLIVE) (ENSURE ENLIVE) liquid 237 mL  237 mL Oral BID BM McBane, Caroline N, PA-C   237 mL at 06/01/19 1009  . HYDROmorphone (DILAUDID) injection 0.5 mg  0.5 mg Intravenous Q4H  PRN McBane, Maylene Roes, PA-C      . lactated ringers infusion   Intravenous Continuous Ethelda Chick, PA-C   New Bag at 05/31/19 1456  . levothyroxine (SYNTHROID) tablet 75 mcg  75 mcg Oral QHS Ethelda Chick, PA-C   75 mcg at 06/01/19 2121  . magnesium citrate solution 1 Bottle  1 Bottle Oral Once PRN McBane, Maylene Roes, PA-C      . MEDLINE mouth rinse  15 mL Mouth Rinse BID Isaac Bliss, Rayford Halsted, MD   15 mL at 06/01/19 2123  . menthol-cetylpyridinium (CEPACOL) lozenge 3 mg  1 lozenge Oral PRN McBane, Maylene Roes, PA-C       Or  . phenol (CHLORASEPTIC) mouth spray 1 spray  1 spray Mouth/Throat PRN McBane, Maylene Roes, PA-C      . methocarbamol (ROBAXIN) tablet 500 mg  500 mg Oral Q8H PRN McBane, Maylene Roes, PA-C       Or  . methocarbamol (ROBAXIN) 500 mg in dextrose 5 % 50 mL IVPB  500 mg Intravenous Q8H PRN McBane, Maylene Roes, PA-C      . metoCLOPramide (REGLAN) tablet 5-10 mg  5-10 mg Oral Q8H PRN McBane, Maylene Roes, PA-C       Or  . metoCLOPramide (REGLAN) injection 5-10 mg  5-10 mg Intravenous Q8H PRN McBane, Maylene Roes, PA-C      . mirtazapine (REMERON) tablet 7.5 mg  7.5 mg Oral QHS Ethelda Chick, PA-C   7.5 mg at 06/01/19 2122  . multivitamin with minerals tablet 1 tablet  1 tablet Oral Daily Ethelda Chick, PA-C   1 tablet at 06/01/19 1007  . mupirocin ointment (BACTROBAN) 2 % 1 application  1 application Topical BID Ethelda Chick, PA-C   1 application at 86/57/84 2124  . ondansetron (ZOFRAN) tablet 4 mg  4 mg Oral Q6H PRN McBane, Maylene Roes, PA-C       Or  . ondansetron (ZOFRAN) injection 4 mg  4 mg Intravenous Q6H PRN McBane, Maylene Roes, PA-C      . oxyCODONE (Oxy IR/ROXICODONE) immediate release tablet 10 mg  10 mg Oral Q4H PRN McBane, Caroline N, PA-C      . oxyCODONE (Oxy IR/ROXICODONE) immediate release tablet 5 mg  5 mg Oral Q4H PRN McBane, Maylene Roes, PA-C      . polyethylene glycol (MIRALAX / GLYCOLAX) packet 17 g  17 g Oral Daily PRN McBane, Maylene Roes, PA-C      . rivaroxaban (XARELTO) tablet 20 mg  20 mg Oral Q supper Ethelda Chick, PA-C   20 mg at 06/01/19 1831   Facility-Administered Medications Ordered in Other Encounters  Medication Dose Route Frequency Provider Last Rate Last Admin  . acetaminophen (TYLENOL) tablet 650 mg  650 mg Oral Once Nicholas Lose, MD         Discharge Medications: Please see discharge summary for a list of discharge medications.  Relevant Imaging Results:  Relevant Lab Results:   Additional Information  SS# [id number]  Sharin Mons, RN

## 2019-06-02 NOTE — Discharge Summary (Signed)
Physician Discharge Summary  Lisa [name] NWG:[id number] DOB: 04/26/1932 DOA: 05/30/2019  PCP: [name], MD  Admit date: 05/30/2019 Discharge date: 06/02/2019  Time spent: 45 minutes  Recommendations for Outpatient Follow-up:  -To be discharged to SNF today for ST-rehab following hip fracture repair. -Follow up with Dr. Griffin Basil in 2 weeks.   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 Hal Hope on 3/2: Lisa [name] is a 86 y.o. female with history of stage III lung cancer, hypertension had a fall at a restaurant when patient was walking and tripped on the chair.  Denies hitting head.  Had pain of the left hip area and was brought to the ER.  Patient is being treated for lung cancer with chemo and had received 1 unit of PRBC yesterday for anemia.  Patient denies any chest pain or shortness of breath.  Hospital Course:   Left hip fracture -Status post mechanical fall. -Discussed with orthopedics, Dr. Griffin Basil.  -S/p left hip hemi arthroplasty 3/3. -PT/OT recs SNF. No supervision available at home.  Stage III lung cancer -On chemotherapy, followed outpatient by Dr. Earlie Server.  Pancytopenia, chemotherapy-induced -She did receive 1 unit of PRBCs on 3/1 due to her anemia. -Hemoglobin is 9.1 on DC.  History of DVT -On Xarelto as an outpatient. -Resumed day prior to DC.  Hypertension -Is currently well controlled. -Continue amlodipine.  Hypothyroidism -TSH is WNL, continue home dose of Synthroid.  Hyperlipidemia -Does not appear to be on any medications as an  outpatient.  Hospital Delirium -Causing her to remove her wound vac. -Is pleseantly confused at bedside currently.  Procedures:  Left hip hemiarthroplasty 3/3   Consultations:  Orthopedics, Dr. Griffin Basil  Discharge Instructions  Discharge Instructions    Increase activity slowly   Complete by: As directed      Allergies as of 06/02/2019      Reactions   Atorvastatin Other (See Comments)   CAUSED BURNING AND TINGLING IN LEGS   Celebrex [celecoxib] Swelling, Rash   SWELLING REACTION UNSPECIFIED    Cholecalciferol Anxiety, Other (See Comments)   Nervous, crying with high dose replacement   Simvastatin Other (See Comments)   Muscle pain   Tape Other (See Comments)   SKIN IS SENSITIVE!!      Medication List    STOP taking these medications   prochlorperazine 10 MG tablet Commonly known as: COMPAZINE     TAKE these medications   albuterol 108 (90 Base) MCG/ACT inhaler Commonly known as: VENTOLIN HFA Inhale 2 puffs into the lungs every 6 (six) hours as needed for wheezing or shortness of breath.   amLODipine 10 MG tablet Commonly known as: NORVASC Take 1 tablet by mouth once daily What changed: when to take this   Coenzyme Q10 100 MG capsule Take 100 mg by mouth daily.   docusate sodium 100 MG capsule Commonly known as: COLACE Take 100 mg by mouth daily as needed for mild constipation.   fluticasone 50 MCG/ACT nasal spray Commonly known as: FLONASE USE 2 SPRAYS IN EACH NOSTRIL  DAILY AS NEEDED FOR ALLERGIES What changed:   how much to take  how to take this  when to take this  reasons to take this  additional instructions   levothyroxine 75 MCG tablet Commonly known as: SYNTHROID Take 1 tablet by mouth once daily What changed: when to take this   lidocaine-prilocaine cream Commonly known as: EMLA Apply 1 application topically as needed. What changed: reasons to take this   loratadine 10 MG tablet Commonly known as: CLARITIN Take 1 tablet (10  mg total) by mouth daily as needed for allergies or rhinitis.   mirtazapine 7.5 MG tablet Commonly known as: REMERON Take 1 tablet (7.5 mg total) by mouth at bedtime.   multivitamin with minerals Tabs tablet Take 1 tablet by mouth daily. Start taking on: June 03, 2019   sennosides-docusate sodium 8.6-50 MG tablet Commonly known as: SENOKOT-S Take 1 tablet by mouth daily as needed for constipation.   Vitamin D3 50 MCG (2000 UT) Tabs Take 2,000 Units by mouth 3 (three) times a week.   Xarelto 20 MG Tabs tablet Generic drug: rivaroxaban TAKE 1 TABLET BY MOUTH ONCE DAILY WITH SUPPER What changed: See the new instructions.            Durable Medical Equipment  (From admission, onward)         Start     Ordered   06/01/19 1431  For home use only DME Walker  Once    Question:  Patient needs a walker to treat with the following condition  Answer:  Weakness   06/01/19 1430   06/01/19 1430  For home use only DME 3 n 1  Once     06/01/19 1430         Allergies  Allergen Reactions  . Atorvastatin Other (See Comments)    CAUSED BURNING AND TINGLING IN LEGS  . Celebrex [Celecoxib] Swelling and Rash    SWELLING REACTION UNSPECIFIED   . Cholecalciferol Anxiety and Other (See Comments)    Nervous, crying with high dose replacement  . Simvastatin Other (See Comments)    Muscle pain  . Tape Other (See Comments)    SKIN IS SENSITIVE!!   Follow-up Information    Hiram Gash, MD In 2 weeks.   Specialty: Orthopedic Surgery Why: For suture removal, For wound re-check Contact information: [address] [address] [phone]        [facility] Follow up.   Specialty: Home Health Services Why: Home health services arranged           The results of significant diagnostics from this hospitalization (including imaging, microbiology, ancillary and laboratory) are listed below for reference.    Significant Diagnostic  Studies: DG Chest 1 View  Result Date: 05/30/2019 CLINICAL DATA:  Fall, no chest complaints EXAM: CHEST  1 VIEW COMPARISON:  CT 03/07/2019, PET-CT 03/21/2019 FINDINGS: Large right infrahilar soft tissue attenuation compatible with previously seen FDG avid tissue, most compatible with recurrent lung malignancy. Obscuration of the right hemidiaphragm may reflect chronic right effusion seen on comparison studies. Additional atelectatic changes in the right lung base are likely present as well. Some scarring reticular changes is present in the left lung base as well, similar to comparison cross-sectional imaging. No new consolidative opacity is seen in the lungs. Cardiomediastinal contours are unremarkable for portable technique with a calcified, tortuous aorta. No acute osseous or soft tissue abnormality. Degenerative changes are present in the imaged spine and  shoulders. Right IJ approach Port-A-Cath tip terminates at the level of the right atrium. IMPRESSION: Right perihilar attenuation compatible with recurrent malignancy seen on comparison PET-CT. Lung volume loss and chronic right effusion in the right lung base are likely similar to prior. No new acute cardiopulmonary abnormality or traumatic findings in the chest. Electronically Signed   By: Lovena Le M.D.   On: 05/30/2019 20:00   DG Hip Port Unilat With Pelvis 1V Left  Result Date: 05/31/2019 CLINICAL DATA:  86 year old female with left hip arthroplasty. EXAM: DG HIP (WITH OR WITHOUT PELVIS) 1V PORT LEFT COMPARISON:  Left knee radiograph dated 05/30/2019. FINDINGS: There is a left hip arthroplasty. The arthroplasty appears intact. There is no acute fracture or dislocation. The bones are osteopenic. Postsurgical changes in the soft tissues of the left hip. IMPRESSION: Left hip arthroplasty.  No immediate complication. Electronically Signed   By: Anner Crete M.D.   On: 05/31/2019 18:31   DG Hip Unilat With Pelvis 2-3 Views Left  Result Date:  05/30/2019 CLINICAL DATA:  Mechanical fall, left hip pain EXAM: DG HIP (WITH OR WITHOUT PELVIS) 2-3V LEFT COMPARISON:  PET-CT 03/21/2019 FINDINGS: Mildly foreshortened transcervical left femoral neck fracture with associated left hip effusion. Left femoral head remains normally located. Remaining bones of the pelvis are intact and congruent. Bilateral hip arthrosis as well as additional degenerative changes in the lower lumbar spine, SI joints and symphysis pubis are noted. Stable likely bone island seen in the posterior right acetabulum. Vascular calcium noted in the pelvis. Remaining soft tissues are unremarkable. IMPRESSION: Mildly foreshortened transcervical left femoral neck fracture with associated left hip effusion. Electronically Signed   By: Lovena Le M.D.   On: 05/30/2019 19:57    Microbiology: Recent Results (from the past 240 hour(s))  SARS CORONAVIRUS 2 (TAT 6-24 HRS) Nasopharyngeal Nasopharyngeal Swab     Status: None   Collection Time: 05/30/19  7:55 PM   Specimen: Nasopharyngeal Swab  Result Value Ref Range Status   SARS Coronavirus 2 NEGATIVE NEGATIVE Final    Comment: (NOTE) SARS-CoV-2 target nucleic acids are NOT DETECTED. The SARS-CoV-2 RNA is generally detectable in upper and lower respiratory specimens during the acute phase of infection. Negative results do not preclude SARS-CoV-2 infection, do not rule out co-infections with other pathogens, and should not be used as the sole basis for treatment or other patient management decisions. Negative results must be combined with clinical observations, patient history, and epidemiological information. The expected result is Negative. Fact Sheet for Patients: SugarRoll.be Fact Sheet for Healthcare Providers: https://www.woods-mathews.com/ This test is not yet approved or cleared by the [place] FDA and  has been authorized for detection and/or diagnosis of SARS-CoV-2 by FDA under  an Emergency Use Authorization (EUA). This EUA will remain  in effect (meaning this test can be used) for the duration of the COVID-19 declaration under Section 56 4(b)(1) of the Act, 21 U.S.C. section 360bbb-3(b)(1), unless the authorization is terminated or revoked sooner. Performed at [facility], [place] [address] [place], [place] [address]   Surgical pcr screen     Status: Abnormal   Collection Time: 05/31/19  4:41 AM   Specimen: Nasal Mucosa; Nasal Swab  Result Value Ref Range Status   MRSA, PCR NEGATIVE NEGATIVE Final   Staphylococcus aureus POSITIVE (A) NEGATIVE Final    Comment: (NOTE) The Xpert SA Assay (FDA approved for NASAL specimens in patients 63 years of age and older), is one component of a comprehensive surveillance  program. It is not intended to diagnose infection nor to guide or monitor treatment. Performed at [facility], [place] [address], [place], [place] [address]      Labs: Basic Metabolic Panel: Recent Labs  Lab 05/29/19 1258 05/29/19 1258 05/30/19 1930 05/30/19 1930 05/31/19 0802 05/31/19 1632 06/01/19 0400 06/02/19 0415 06/02/19 0802  NA 142   < > 141  --  140 141 141  --  145  K 4.3   < > 3.6  --  3.5 4.0 4.4  --  4.2  CL 109   < > 107  --  104 105 108  --  109  CO2 28  --  26  --  28  --  25  --  27  GLUCOSE 99   < > 102*  --  87 91 135*  --  125*  BUN 19   < > 19  --  19 17 22   --  22  CREATININE 1.31*  --  1.49*   < > 1.34* 1.10* 1.44* 1.39* 1.41*  CALCIUM 8.7*  --  8.6*  --  8.8*  --  8.3*  --  8.6*   < > = values in this interval not displayed.   Liver Function Tests: Recent Labs  Lab 05/29/19 1258 05/31/19 0802  AST 20 27  ALT 11 16  ALKPHOS 88 88  BILITOT 0.3 0.5  PROT 5.9* 5.6*  ALBUMIN 3.1* 2.9*   No results for input(s): LIPASE, AMYLASE in the last 168 hours. No results for input(s): AMMONIA in the last 168 hours. CBC: Recent Labs  Lab 05/29/19 1258 05/29/19 1258 05/30/19 1930 05/31/19 0802  05/31/19 1632 06/01/19 0400 06/02/19 0802  WBC 7.3  --  8.1 7.3  --  15.5* 7.9  NEUTROABS 4.9  --  7.0 5.4  --   --   --   HGB 7.5*  --  10.1* 10.0* 8.8* 9.6* 9.1*  HCT 24.5*   < > 32.3* 32.3* 26.0* 30.3* 29.1*  MCV 96.1  --  95.3 95.0  --  95.3 97.0  PLT 66*  --  65* 61*  --  73* 60*   < > = values in this interval not displayed.   Cardiac Enzymes: No results for input(s): CKTOTAL, CKMB, CKMBINDEX, TROPONINI in the last 168 hours. BNP: BNP (last 3 results) No results for input(s): BNP in the last 8760 hours.  ProBNP (last 3 results) No results for input(s): PROBNP in the last 8760 hours.  CBG: No results for input(s): GLUCAP in the last 168 hours.     Signed:  Lelon Frohlich  [facility] Pager: [phone] 06/02/2019, 9:54 AM

## 2019-06-02 NOTE — Progress Notes (Addendum)
   ORTHOPAEDIC PROGRESS NOTE  s/p Procedure(s): LEFT HIP HEMIARTHROPLASTY on 05/31/2019 by Dr. Griffin Basil  SUBJECTIVE: Patient is resting comfortably in hospital bed. She reports minimal pain about operative site. No chest pain. No SOB. No nausea/vomiting. No other complaints.   OBJECTIVE: PE: Left lower extremity: Praveena dressing in place with good seal, leg lengths equal, intact EHL/TA/GSC, warm well perfused foot  Vitals:   06/02/19 0335 06/02/19 0802  BP: 119/81 117/74  Pulse: 87 (!) 103  Resp: 18 16  Temp: 97.8 F (36.6 C) 98.2 F (36.8 C)  SpO2: 96% 97%   IMAGING: Stable post-op imaging.   ASSESSMENT: Lisa [name] is a 86 y.o. female doing reasonable postoperatively. POD#2  PLAN: Weightbearing: WBAT LLE Insicional and dressing care: Wound VAC should be left in place Orthopedic device(s): Wound VAC - will remove before discharge to SNF, Abduction pillow Showering: Post-op day #2 with assistance VTE prophylaxis: Patient restarted on baseline oral anticoagulant, Xarelto Pain control: PRN pain medications, preferring oral medications. Minimize narcotics as able Follow - up plan: 2 weeks in office for incision check, suture removal, repeat X-ray Contact information: Noemi Chapel PA-C, Dr. Ophelia Charter Dispo: likely SNF. TOC following.   Once cleared by medicine team and therapies, cleared for discharge from orthopedics standpoint.  Noemi Chapel, PA-C 06/02/2019

## 2019-06-02 NOTE — TOC Progression Note (Addendum)
Transition of Care [facility]) - Progression Note    Patient Details  Name: Lisa [name] MRN: [id number] Date of Birth: 1932-11-21  Transition of [facility]) CM/SW Contact  Sharin Mons, RN Phone Number: [phone] 06/02/2019, 8:35 AM  Clinical Narrative:    Pt to transition to SNF. SNF workup completed. Awaiting SNF bed offers. Insurance authorization pending. Pt will probably need updated COVID if d/c after today.  TOC team will continue to monitor for needs ....   Expected Discharge Plan: Skilled Nursing Facility Barriers to Discharge: Other (comment)(Awaiting SNF bed offers, insurance authorization pending)  Expected Discharge Plan and Services Expected Discharge Plan: [place]   Discharge Planning Services: CM Consult Post Acute Care Choice: [place] arrangements for the past 2 months: Single Family Home                 DME Arranged: 3-N-1, Walker rolling DME Agency: [employer] Date DME Agency Contacted: 06/01/19 Time DME Agency Contacted: [phone] Representative spoke with at DME Agency: zack HH Arranged: PT, OT [place] Agency: [place] ([place]) Date [place]: 06/01/19 Time [place]: 1428 Representative spoke with at [place]: [place] ([place]) Interventions    Readmission Risk Interventions No flowsheet data found.

## 2019-06-02 NOTE — Evaluation (Signed)
Occupational Therapy Evaluation Patient Details Name: Lisa [name] MRN: [id number] DOB: 11-16-1932 Today's Date: 06/02/2019    History of Present Illness 86 yo admitted after fall at restaurant with left hip fx s/p left hip hemiarthroplasty. PMHx: lung CA, HTN, HLD   Clinical Impression   Pt with decline in function and safety with ADLs and ADL mobility with impaired strength , balance, endurance and hx of cognitive impairments. Pt lives at home with her husband and was independent with ADLs/selfcare, used a cane for mobility and grand daughters assisted with IADLs and managed finances. Pt currently requires min guard A with grooming and UB ADLs, total  With LB ADLs and toileting and min A with mobility using RW. Pt pleasantly confused. Pt would benefit from acute OT services to address impairments to maximize level of function and safety    Follow Up Recommendations  SNF;Supervision/Assistance - 24 hour    Equipment Recommendations  Other (comment)(TBD at next venue of care)    Recommendations for Other Services       Precautions / Restrictions Precautions Precautions: Posterior Hip;Fall Precaution Booklet Issued: Yes (comment) Precaution Comments: VAC Restrictions Weight Bearing Restrictions: Yes LLE Weight Bearing: Weight bearing as tolerated      Mobility Bed Mobility Overal bed mobility: Needs Assistance Bed Mobility: Sit to Supine     Supine to sit: Min assist;HOB elevated Sit to supine: Mod assist   General bed mobility comments: mod A with LEs back onto bed  Transfers Overall transfer level: Needs assistance Equipment used: Rolling walker (2 wheeled) Transfers: Sit to/from Stand Sit to Stand: Min assist         General transfer comment: cues for hand placement, safety and precautions    Balance Overall balance assessment: Needs assistance Sitting-balance support: No upper extremity supported;Feet supported Sitting balance-Leahy Scale: Fair Sitting  balance - Comments: pt able to sit eOB and at toilet without assist   Standing balance support: Bilateral upper extremity supported;During functional activity Standing balance-Leahy Scale: Poor Standing balance comment: able to wash hands without support, bil UE support on RW for gait                           ADL either performed or assessed with clinical judgement   ADL Overall ADL's : Needs assistance/impaired Eating/Feeding: Set up;Independent;Sitting   Grooming: Wash/dry hands;Wash/dry face;Min guard;Sitting   Upper Body Bathing: Min guard;Sitting   Lower Body Bathing: Maximal assistance   Upper Body Dressing : Min guard;Sitting   Lower Body Dressing: Total assistance   Toilet Transfer: Minimal assistance;RW;Stand-pivot;BSC;Cueing for safety;Cueing for sequencing   Toileting- Clothing Manipulation and Hygiene: Maximal assistance;Sit to/from stand       Functional mobility during ADLs: Minimal assistance;Cueing for safety;Cueing for sequencing;Rolling walker       Vision Baseline Vision/History: Wears glasses Patient Visual Report: No change from baseline       Perception     Praxis      Pertinent Vitals/Pain Pain Assessment: Faces Pain Score: 5  Faces Pain Scale: Hurts little more Pain Location: left hip Pain Descriptors / Indicators: Aching;Guarding;Sore Pain Intervention(s): Limited activity within patient's tolerance;Monitored during session;Repositioned     Hand Dominance Right   Extremity/Trunk Assessment Upper Extremity Assessment Upper Extremity Assessment: Generalized weakness   Lower Extremity Assessment Lower Extremity Assessment: Defer to PT evaluation   Cervical / Trunk Assessment Cervical / Trunk Assessment: Kyphotic   Communication Communication Communication: No difficulties   Cognition Arousal/Alertness: Awake/alert  Behavior During Therapy: Restless Overall Cognitive Status: History of cognitive impairments - at  baseline                                 General Comments: pt aware she fell and in hospital,   General Comments       Exercises    Shoulder Instructions      Home Living Family/patient expects to be discharged to:: Skilled nursing facility Living Arrangements: Spouse/significant other Available Help at Discharge: Family;Available PRN/intermittently Type of Home: House Home Access: Stairs to enter [employer] of Steps: 3   Home Layout: One level     Bathroom Shower/Tub: Occupational psychologist: Handicapped height     Home Equipment: Cane - single point          Prior Functioning/Environment Level of Independence: Needs assistance  Gait / Transfers Assistance Needed: was walking with cane ADL's / Homemaking Assistance Needed: pt was performing ADLs, family assist for IADLs grandaughters manage finances   Comments: Pt lives with spouse and son (works at a [employer] Grandaugther [name] is CRNA at [employer] and very involved in care. Family provided home setup and PLOF        OT Problem List: Decreased strength;Impaired balance (sitting and/or standing);Decreased cognition;Decreased knowledge of precautions;Pain;Decreased safety awareness;Decreased activity tolerance;Decreased knowledge of use of DME or AE      OT Treatment/Interventions: Self-care/ADL training;DME and/or AE instruction;Therapeutic activities;Balance training;Therapeutic exercise;Patient/family education    OT Goals(Current goals can be found in the care plan section) Acute Rehab OT Goals Patient Stated Goal: be able to return home OT Goal Formulation: With patient Time For Goal Achievement: 06/16/19 Potential to Achieve Goals: Good ADL Goals Pt Will Perform Grooming: with min assist;with min guard assist;standing Pt Will Perform Upper Body Bathing: with supervision;with set-up;sitting Pt Will Perform Lower Body Bathing: with mod assist;sitting/lateral leans;sit  to/from stand Pt Will Perform Upper Body Dressing: with supervision;with set-up;sitting Pt Will Transfer to Toilet: with min guard assist;ambulating;bedside commode  OT Frequency: Min 2X/week   Barriers to D/C:            Co-evaluation              AM-PAC OT "6 Clicks" Daily Activity     Outcome Measure Help from another person eating meals?: None Help from another person taking care of personal grooming?: A Little Help from another person toileting, which includes using toliet, bedpan, or urinal?: Total Help from another person bathing (including washing, rinsing, drying)?: A Lot Help from another person to put on and taking off regular upper body clothing?: A Little Help from another person to put on and taking off regular lower body clothing?: Total 6 Click Score: 14   End of Session Equipment Utilized During Treatment: Gait belt;Rolling walker;Other (comment)(BSC)  Activity Tolerance: Patient tolerated treatment well Patient left: in bed;with call bell/phone within reach;with bed alarm set  OT Visit Diagnosis: Unsteadiness on feet (R26.81);Other abnormalities of gait and mobility (R26.89);Muscle weakness (generalized) (M62.81);History of falling (Z91.81);Other symptoms and signs involving cognitive function;Pain Pain - Right/Left: Left Pain - part of body: Hip;Leg                Time: 7824-2353 OT Time Calculation (min): 27 min Charges:  OT General Charges $OT Visit: 1 Visit OT Evaluation $OT Eval Moderate Complexity: 1 Mod OT Treatments $Self Care/Home Management : 8-22 mins    Emmit Alexanders  Jeanette 06/02/2019, 2:22 PM

## 2019-06-02 NOTE — Plan of Care (Signed)
  Problem: Education: Goal: Verbalization of understanding the information provided (i.e., activity precautions, restrictions, etc) will improve Outcome: Progressing   

## 2019-06-02 NOTE — Progress Notes (Signed)
Physical Therapy Treatment Patient Details Name: Lisa [name] MRN: [id number] DOB: 04-21-1932 Today's Date: 06/02/2019    History of Present Illness 86 yo admitted after fall at restaurant with left hip fx s/p left hip hemiarthroplasty. PMHx: lung CA, HTN, HLD    PT Comments    Pt pleasantly confused and aware she fell but cannot follow commands to maintain precautions without tactile cues and need frequent redirection to leave lines alone. Pt with decreased activity tolerance this session but improved movement of LLE. Pt performed HEP in supine and sitting prior to 2nd gait trial and yet stating "I need to exercise before I walk". Pt in chair with hip abduction pillow and RN aware. Will continue to follow.     Follow Up Recommendations  SNF;Supervision/Assistance - 24 hour     Equipment Recommendations  Rolling walker with 5" wheels;3in1 (PT)    Recommendations for Other Services       Precautions / Restrictions Precautions Precautions: Posterior Hip;Fall Precaution Booklet Issued: Yes (comment) Precaution Comments: VAC Restrictions LLE Weight Bearing: Weight bearing as tolerated    Mobility  Bed Mobility Overal bed mobility: Needs Assistance Bed Mobility: Supine to Sit     Supine to sit: Min assist;HOB elevated     General bed mobility comments: HOb 30 degrees with increased time to pivot to right with min assist to maintain precautions and cues for sequence  Transfers Overall transfer level: Needs assistance   Transfers: Sit to/from Stand Sit to Stand: Min assist         General transfer comment: cues for LLE positioning, precautions, hand placement and assist to rise from bed and toilet. max multimodal cues for safety and positioning to maintain precautions  Ambulation/Gait Ambulation/Gait assistance: Min assist Gait Distance (Feet): 15 Feet Assistive device: Rolling walker (2 wheeled) Gait Pattern/deviations: Step-to pattern;Decreased stride length    Gait velocity interpretation: <1.8 ft/sec, indicate of risk for recurrent falls General Gait Details: pt with cues for sequence with frequent assist to maintain precautions particularly turning toe in with directional change and tendency to scissor with gait. Pt walked 15' to and from bathroom but unable to progress distance.   Stairs             Wheelchair Mobility    Modified Rankin (Stroke Patients Only)       Balance Overall balance assessment: Needs assistance   Sitting balance-Leahy Scale: Fair Sitting balance - Comments: pt able to sit eOB and at toilet without assist   Standing balance support: Bilateral upper extremity supported Standing balance-Leahy Scale: Fair Standing balance comment: able to wash hands without support, bil UE support on RW for gait                            Cognition Arousal/Alertness: Awake/alert Behavior During Therapy: Restless Overall Cognitive Status: History of cognitive impairments - at baseline                                 General Comments: pt aware she fell. Restless and continually picking at [facility], underwear, cardiac monitor. Pt requires max cues for redirection to task and attention to precautions      Exercises General Exercises - Lower Extremity Long Arc Quad: AROM;Left;Seated;15 reps Heel Slides: AAROM;Left;Supine;10 reps Hip ABduction/ADduction: AAROM;Both;Seated;15 reps    General Comments        Pertinent Vitals/Pain Pain Assessment: 0-10  Pain Score: 5  Pain Location: left hip Pain Descriptors / Indicators: Aching;Guarding;Sore Pain Intervention(s): Limited activity within patient's tolerance;Monitored during session;Repositioned;Patient requesting pain meds-RN notified    Home Living                      Prior Function            PT Goals (current goals can now be found in the care plan section) Progress towards PT goals: Progressing toward goals    Frequency     Min 3X/week      PT Plan Current plan remains appropriate    Co-evaluation              AM-PAC PT "6 Clicks" Mobility   Outcome Measure  Help needed turning from your back to your side while in a flat bed without using bedrails?: A Little Help needed moving from lying on your back to sitting on the side of a flat bed without using bedrails?: A Little Help needed moving to and from a bed to a chair (including a wheelchair)?: A Little Help needed standing up from a chair using your arms (e.g., wheelchair or bedside chair)?: A Little Help needed to walk in hospital room?: A Little Help needed climbing 3-5 steps with a railing? : Total 6 Click Score: 16    End of Session Equipment Utilized During Treatment: Gait belt Activity Tolerance: Patient tolerated treatment well Patient left: in chair;with call bell/phone within reach;with chair alarm set;Other (comment)(hip abduction pillow) Nurse Communication: Mobility status;Precautions;Weight bearing status PT Visit Diagnosis: Other abnormalities of gait and mobility (R26.89);Difficulty in walking, not elsewhere classified (R26.2);History of falling (Z91.81)     Time: 8756-4332 PT Time Calculation (min) (ACUTE ONLY): 28 min  Charges:  $Gait Training: 8-22 mins $Therapeutic Exercise: 8-22 mins                     Chamaine Stankus P, PT Acute Rehabilitation Services Pager: [phone] Office: [place] 06/02/2019, 1:35 PM

## 2019-06-03 DIAGNOSIS — E039 Hypothyroidism, unspecified: Secondary | ICD-10-CM | POA: Diagnosis not present

## 2019-06-03 DIAGNOSIS — M2550 Pain in unspecified joint: Secondary | ICD-10-CM | POA: Diagnosis not present

## 2019-06-03 DIAGNOSIS — M8580 Other specified disorders of bone density and structure, unspecified site: Secondary | ICD-10-CM | POA: Diagnosis not present

## 2019-06-03 DIAGNOSIS — E785 Hyperlipidemia, unspecified: Secondary | ICD-10-CM | POA: Diagnosis not present

## 2019-06-03 DIAGNOSIS — S72002D Fracture of unspecified part of neck of left femur, subsequent encounter for closed fracture with routine healing: Secondary | ICD-10-CM | POA: Diagnosis not present

## 2019-06-03 DIAGNOSIS — I82409 Acute embolism and thrombosis of unspecified deep veins of unspecified lower extremity: Secondary | ICD-10-CM | POA: Diagnosis not present

## 2019-06-03 DIAGNOSIS — Z7401 Bed confinement status: Secondary | ICD-10-CM | POA: Diagnosis not present

## 2019-06-03 DIAGNOSIS — R2689 Other abnormalities of gait and mobility: Secondary | ICD-10-CM | POA: Diagnosis not present

## 2019-06-03 DIAGNOSIS — R402411 Glasgow coma scale score 13-15, in the field [EMT or ambulance]: Secondary | ICD-10-CM | POA: Diagnosis not present

## 2019-06-03 DIAGNOSIS — I10 Essential (primary) hypertension: Secondary | ICD-10-CM | POA: Diagnosis not present

## 2019-06-03 DIAGNOSIS — C3490 Malignant neoplasm of unspecified part of unspecified bronchus or lung: Secondary | ICD-10-CM | POA: Diagnosis not present

## 2019-06-03 DIAGNOSIS — R2681 Unsteadiness on feet: Secondary | ICD-10-CM | POA: Diagnosis not present

## 2019-06-03 DIAGNOSIS — S72009A Fracture of unspecified part of neck of unspecified femur, initial encounter for closed fracture: Secondary | ICD-10-CM | POA: Diagnosis not present

## 2019-06-03 DIAGNOSIS — C3491 Malignant neoplasm of unspecified part of right bronchus or lung: Secondary | ICD-10-CM | POA: Diagnosis not present

## 2019-06-03 DIAGNOSIS — D61818 Other pancytopenia: Secondary | ICD-10-CM | POA: Diagnosis not present

## 2019-06-03 DIAGNOSIS — R41841 Cognitive communication deficit: Secondary | ICD-10-CM | POA: Diagnosis not present

## 2019-06-03 DIAGNOSIS — M6281 Muscle weakness (generalized): Secondary | ICD-10-CM | POA: Diagnosis not present

## 2019-06-03 DIAGNOSIS — Z20828 Contact with and (suspected) exposure to other viral communicable diseases: Secondary | ICD-10-CM | POA: Diagnosis not present

## 2019-06-03 LAB — BASIC METABOLIC PANEL
Anion gap: 9 (ref 5–15)
BUN: 23 mg/dL (ref 8–23)
CO2: 24 mmol/L (ref 22–32)
Calcium: 8.6 mg/dL — ABNORMAL LOW (ref 8.9–10.3)
Chloride: 110 mmol/L (ref 98–111)
Creatinine, Ser: 1.3 mg/dL — ABNORMAL HIGH (ref 0.44–1.00)
GFR calc Af Amer: 44 mL/min — ABNORMAL LOW (ref >60)
GFR calc non Af Amer: 38 mL/min — ABNORMAL LOW (ref >60)
Glucose, Bld: 121 mg/dL — ABNORMAL HIGH (ref 70–99)
Potassium: 3.7 mmol/L (ref 3.5–5.1)
Sodium: 143 mmol/L (ref 135–145)

## 2019-06-03 LAB — CBC
HCT: 28.7 % — ABNORMAL LOW (ref 36.0–46.0)
Hemoglobin: 9.1 g/dL — ABNORMAL LOW (ref 12.0–15.0)
MCH: 30.7 pg (ref 26.0–34.0)
MCHC: 31.7 g/dL (ref 30.0–36.0)
MCV: 97 fL (ref 80.0–100.0)
Platelets: 63 10*3/uL — ABNORMAL LOW (ref 150–400)
RBC: 2.96 MIL/uL — ABNORMAL LOW (ref 3.87–5.11)
RDW: 19.1 % — ABNORMAL HIGH (ref 11.5–15.5)
WBC: 6.2 10*3/uL (ref 4.0–10.5)
nRBC: 0 % (ref 0.0–0.2)

## 2019-06-03 LAB — SARS CORONAVIRUS 2 (TAT 6-24 HRS): SARS Coronavirus 2: NEGATIVE

## 2019-06-03 NOTE — Care Management (Addendum)
Patient d/c to SNF.  RW and 3n1 delivered by [facility] were not sent with patient.  [facility] aware to retrieve DME from [facility]N.

## 2019-06-03 NOTE — TOC Progression Note (Signed)
Transition of Care [facility]) - Progression Note    Patient Details  Name: Lisa [name] MRN: [id number] Date of Birth: 29-Jul-1932  Transition of Care [facility]) CM/SW [place], [place] Phone Number: [phone] 06/03/2019, 9:25 AM  Clinical Narrative:     CSW spoke with patient's granddaughter Lisa [name] to review bed offers. CSW informed Lisa [name] that [facility] declined referral. Lisa [name] explained that was their first choice and asked if CSW called reach out to [facility] to ascertain why the referral was declined. CSW encouraged Lisa [name] to review the ratings on medicare.gov for the facilities that have made an offer.  CSW will follow up with Lisa [name] after speaking with [facility].  CSW will continue to follow for discharge planning needs.  Expected Discharge Plan: Skilled Nursing Facility Barriers to Discharge: Other (comment)(Awaiting SNF bed offers, insurance authorization pending)  Expected Discharge Plan and Services Expected Discharge Plan: [place]   Discharge Planning Services: CM Consult Post Acute Care Choice: [place] arrangements for the past 2 months: Single Family Home Expected Discharge Date: 06/02/19               DME Arranged: Berta Minor rolling DME Agency: [facility] Date DME Agency Contacted: 06/01/19 Time DME Agency Contacted: [phone] Representative spoke with at DME Agency: [facility] HH Arranged: PT, OT [place] Agency: [place] ([place]) Date [place]: 06/01/19 Time [place]: 1428 Representative spoke with at [place]: [place] ([place]) Interventions    Readmission Risk Interventions No flowsheet data found.

## 2019-06-03 NOTE — Progress Notes (Signed)
Physical Therapy Treatment Patient Details Name: Lisa [name] MRN: [id number] DOB: [birth year] Today's Date: 06/03/2019    History of Present Illness 86 yo admitted after fall at restaurant with left hip fx s/p left hip hemiarthroplasty. PMHx: lung CA, HTN, HLD    PT Comments    RN called to request PT session due to family now considering home with [facility] instead of SNF. As concluding session, pt gave permission to call [name], grandaughter and discuss her current status and dc plan. Patient present for phone call and discussion. Both agreed pt continues to need SNF due to needing 100% cues for maintaining hip precautions and up to mod assist for sit to stand.     Follow Up Recommendations  SNF;Supervision/Assistance - 24 hour     Equipment Recommendations  Rolling walker with 5" wheels;3in1 (PT)    Recommendations for Other Services       Precautions / Restrictions Precautions Precautions: Posterior Hip;Fall Precaution Comments: VAC Restrictions Weight Bearing Restrictions: Yes LLE Weight Bearing: Weight bearing as tolerated    Mobility  Bed Mobility Overal bed mobility: Needs Assistance Bed Mobility: Supine to Sit     Supine to sit: Min assist     General bed mobility comments: max cues for technique and assist to maintain hip precautions  Transfers Overall transfer level: Needs assistance Equipment used: Rolling walker (2 wheeled) Transfers: Sit to/from Stand Sit to Stand: Mod assist         General transfer comment: x 3 reps; mod assist from handicap height toilet with grab bar and from straight chair no armrests; min assist from bed; max cues for technique to maintain hip precautions  Ambulation/Gait Ambulation/Gait assistance: Min assist Gait Distance (Feet): 15 Feet(10, 10 (seated rest each time)) Assistive device: Rolling walker (2 wheeled) Gait Pattern/deviations: Step-to pattern;Decreased stride length;Narrow base of support     General Gait Details:  pt with cues for sequence with frequent assist to maintain precautions particularly turning toe in with directional change and tendency to scissor with gait. Pt walked 15' to bathroom; on exiting bathroom attempted to walk across room but unable due to dizziness/fatigue and brought straight chair for her to sit on   Stairs             Wheelchair Mobility    Modified Rankin (Stroke Patients Only)       Balance Overall balance assessment: Needs assistance Sitting-balance support: No upper extremity supported;Feet supported Sitting balance-Leahy Scale: Fair Sitting balance - Comments: pt able to sit eOB and at toilet without assist   Standing balance support: Bilateral upper extremity supported;During functional activity Standing balance-Leahy Scale: Poor Standing balance comment: able to wash hands without support, bil UE support on RW for gait                            Cognition Arousal/Alertness: Awake/alert Behavior During Therapy: WFL for tasks assessed/performed Overall Cognitive Status: History of cognitive impairments - at baseline                                 General Comments: pt aware she fell and in hospital; could not recall broke Lt hip or had surgery or walking with PT x 2 day;s      Exercises Total Joint Exercises Ankle Circles/Pumps: AROM;Both;10 reps Heel Slides: AROM;Left;5 reps;Supine(assist to prevent excessive hip flexion) Hip ABduction/ADduction: AROM;Left;5 reps;Supine  General Comments General comments (skin integrity, edema, etc.): RN called to request PT session due to family now considering home with [facility] instead of SNF. As concluding session, pt gave permission to call [name], grandaughter and discuss her current status and dc plan. Patient present for phone call and discussion. Both agreed pt continues to need SNF due to needing 100% cues for maintaining hip precautions and up to mod assist for sit to stand.        Pertinent Vitals/Pain Pain Assessment: Faces Faces Pain Scale: No hurt(no symptoms of pain and denied pain)    Home Living                      Prior Function            PT Goals (current goals can now be found in the care plan section) Acute Rehab PT Goals Patient Stated Goal: be able to return home Time For Goal Achievement: 06/15/19 Potential to Achieve Goals: Fair Progress towards PT goals: Progressing toward goals    Frequency    Min 3X/week      PT Plan Current plan remains appropriate    Co-evaluation              AM-PAC PT "6 Clicks" Mobility   Outcome Measure  Help needed turning from your back to your side while in a flat bed without using bedrails?: A Little Help needed moving from lying on your back to sitting on the side of a flat bed without using bedrails?: A Little Help needed moving to and from a bed to a chair (including a wheelchair)?: A Little Help needed standing up from a chair using your arms (e.g., wheelchair or bedside chair)?: A Lot Help needed to walk in hospital room?: A Little Help needed climbing 3-5 steps with a railing? : Total 6 Click Score: 15    End of Session Equipment Utilized During Treatment: Gait belt Activity Tolerance: Treatment limited secondary to medical complications (Comment);Patient limited by fatigue(dizziness) Patient left: in chair;with call bell/phone within reach;with chair alarm set;Other (comment)(hip abduction pillow) Nurse Communication: Mobility status;Precautions;Weight bearing status;Other (comment)(called grandaughter to discuss d/c plan; agrees to [facility]) PT Visit Diagnosis: Other abnormalities of gait and mobility (R26.89);Difficulty in walking, not elsewhere classified (R26.2);History of falling (Z91.81)     Time: 1100-1148 PT Time Calculation (min) (ACUTE ONLY): 48 min  Charges:  $Gait Training: 8-22 mins $Therapeutic Exercise: 8-22 mins $Self Care/Home Management: 8-22                       Lisa [name], PT Pager [phone]    Lisa [name] 06/03/2019, 12:06 PM

## 2019-06-03 NOTE — Discharge Instructions (Signed)
Ophelia Charter MD, MPH [place] [address] [phone] (fax)  POST-OPERATIVE INSTRUCTIONS  WOUND CARE ? You may remove the operative dressing 7 days postop and leave open to air afterwards ? KEEP THE INCISIONS CLEAN AND DRY. ? You may shower on Post-Op Day #2. Gently pat the area dry.   ? The dressing is waterproof.   ? Do not soak the knee in water.  ? Do not go swimming in the pool or ocean until 4 weeks after surgery or when otherwise instructed.  EXERCISES ? You may bear weight as tolerated on your operative extremity. ? When in bed and while sleeping it is imperative you wear your knee immobilizer until we instruct you otherwise while in clinic. ? It is essential that you follow your posterior hip precautions.  o Do not sit in a low chair. (No hip flexion >90 degrees) o Do not cross your legs.  (No Adduction past neutral) o Keep your toes pointed straight ahead at all times (No internal rotation or excessive external rotation) ? Therapy may be performed by an outpatient therapist or at a rehab facility  FOLLOW-UP ? If you develop a Fever (>101.5), Redness or Drainage from the surgical incision site, please call our office to arrange for an evaluation. ? Please call the office to schedule a follow-up appointment for your suture removal, 10-14 days post-operatively.  IF YOU HAVE ANY QUESTIONS, PLEASE FEEL FREE TO CALL OUR OFFICE.  POST-OP MEDICATIONS - Acetaminophen - Non-narcotic pain medicine  - You may take 2 (500 mg) tablets (1,000mg ) every 8 hours as needed for pain.  - Oxycodone - This is a strong narcotic, to be used only on an as needed basis - Take 0.5 - 1 pill every 8 hours as needed for severe pain not controlled by Tylenol.

## 2019-06-03 NOTE — Progress Notes (Signed)
   ORTHOPAEDIC PROGRESS NOTE  s/p Procedure(s): LEFT HIP HEMIARTHROPLASTY on 05/31/2019 by Dr. Griffin Basil  SUBJECTIVE: Patient is resting comfortably in hospital bed. She reports minimal pain about operative site. No other complaints.   OBJECTIVE: PE: Left lower extremity: Praveena dressing in place with good seal, abduction pillow in place, leg lengths equal, intact EHL/TA/GSC, sensation intact distally, warm well perfused foot  Vitals:   06/03/19 0343 06/03/19 0739  BP: 123/75 132/75  Pulse: 92 (!) 105  Resp: 16   Temp: 98.3 F (36.8 C) (!) 97.4 F (36.3 C)  SpO2: 93% 99%   IMAGING: Stable post-op imaging.   ASSESSMENT: Lisa [name] is a 86 y.o. female doing reasonable postoperatively. POD#3  PLAN: Weightbearing: WBAT LLE Insicional and dressing care: Wound VAC should be left in place - remove prior to discharge to SNF Orthopedic device(s): Wound VAC - will remove before discharge to SNF, Abduction pillow Showering: Post-op day #2 with assistance VTE prophylaxis: Patient restarted on baseline oral anticoagulant, Xarelto Pain control: PRN pain medications, preferring oral medications. Minimize narcotics as able. Patient's pain has been well controlled throughout her stay.  Follow - up plan: 2 weeks in office for incision check, suture removal, repeat X-ray Contact information: Dr. Ophelia Charter, Noemi Chapel PA-C Dispo: likely SNF - awaiting bed offers, insurance authorization pending. TOC following.   Once cleared by medicine team and therapies, cleared for discharge from orthopedics standpoint.  Prescription for pain medication printed and placed in patient's chart for discharge to SNF.   Noemi Chapel, PA-C 06/03/2019

## 2019-06-03 NOTE — TOC Transition Note (Addendum)
Transition of Care [facility]) - CM/SW Discharge Note   Patient Details  Name: Lisa [name] MRN: [id number] Date of Birth: 06/27/1932  Transition of Care [facility]) CM/SW Contact:  Bary Castilla, LCSW Phone Number: [phone] 06/03/2019, 11:44 AM   Clinical Narrative:     Patient will DC to:?[facility] Anticipated DC date:?06/03/19 Family notified:?[facility] IW:OEHO   Per MD patient ready for DC to [facility].  RN, patient, patient's family, and facility notified of DC. Discharge Summary sent to facility. RN given number for report [phone] room # 3238. DC packet on chart. Ambulance transport requested for patient for 3pm transport.   CSW signing off.   [place], [place] [phone]   Final next level of care: Skilled Nursing Facility Barriers to Discharge: No Barriers Identified   Patient Goals and CMS Choice     Choice offered to / list presented to : Patient  Discharge Placement              Patient chooses bed at: [facility] Patient to be transferred to facility by: [place] Name of family member notified: [name] Patient and family notified of of transfer: 06/03/19  Discharge Plan and Services   Discharge Planning Services: CM Consult Post Acute Care Choice: Home Health          DME Arranged: 3-N-1, [place] rolling DME Agency: [employer] Date DME Agency Contacted: 06/01/19 Time DME Agency Contacted: [phone] Representative spoke with at DME Agency: [facility] Arranged: PT, OT [place] Agency: [place] ([place]) Date [place]: 06/01/19 Time [place]: [place] Representative spoke with at [place]: [name] ([place]) Interventions     Readmission Risk Interventions No flowsheet data found.

## 2019-06-03 NOTE — Plan of Care (Signed)
  Problem: Education: Goal: Verbalization of understanding the information provided (i.e., activity precautions, restrictions, etc) will improve Outcome: Not Progressing   Problem: Activity: Goal: Ability to ambulate and perform ADLs will improve Outcome: Progressing

## 2019-06-03 NOTE — Progress Notes (Signed)
This nurse spoke with patient's nurse regarding flushing of PORT and discharging patient to SNF. This patient's PORT was deaccessed March 1st according to policy including instilling of heparin.No need to reaccess PORT at this time. Fran Lowes, RN VAST

## 2019-06-03 NOTE — TOC Progression Note (Signed)
Transition of Care [facility]) - Progression Note    Patient Details  Name: Lisa [name] MRN: [id number] Date of Birth: 07/08/1932  Transition of Care [facility]) CM/SW [place], [place] Phone Number: [phone] 06/03/2019, 11:12 AM  Clinical Narrative:    CSW received [facility] authorization. Navi#- [id number] Health plan: pending Case Manager: Hilliard Clark [place] Authorization: 3/5- 3/9  RN and MD alerted.  CSW will continue to follow for discharge planning needs.l   Expected Discharge Plan: Skilled Nursing Facility Barriers to Discharge: Other (comment)(Awaiting SNF bed offers, insurance authorization pending)  Expected Discharge Plan and Services Expected Discharge Plan: [place]   Discharge Planning Services: CM Consult Post Acute Care Choice: [place] arrangements for the past 2 months: Single Family Home Expected Discharge Date: 06/02/19               DME Arranged: Berta Minor rolling DME Agency: [employer] Date DME Agency Contacted: 06/01/19 Time DME Agency Contacted: [phone] Representative spoke with at DME Agency: [facility] Arranged: PT, OT [place] Agency: [place] ([place]) Date [place]: 06/01/19 Time [place]: 1428 Representative spoke with at [place]: [place] ([place]) Interventions    Readmission Risk Interventions No flowsheet data found.

## 2019-06-03 NOTE — Progress Notes (Signed)
Patient seen and examined. Database reviewed. Discharge was held an additional day due to bed availability. No changes to DC Summary are necessary. OK for DC to SNF today.  Domingo Mend, MD [facility] [phone]

## 2019-06-03 NOTE — Progress Notes (Signed)
Report called to Mia at [facility]. Wound vac removed and dressing changed to aquacel.

## 2019-06-03 NOTE — Plan of Care (Signed)

## 2019-06-05 ENCOUNTER — Encounter: Payer: Self-pay | Admitting: Internal Medicine

## 2019-06-05 ENCOUNTER — Ambulatory Visit (HOSPITAL_COMMUNITY): Payer: Medicare Other

## 2019-06-06 ENCOUNTER — Telehealth: Payer: Self-pay | Admitting: Internal Medicine

## 2019-06-06 NOTE — Telephone Encounter (Signed)
I left a message regarding 3/17 on Grandaughter line

## 2019-06-07 ENCOUNTER — Inpatient Hospital Stay: Payer: Medicare Other | Admitting: Internal Medicine

## 2019-06-07 ENCOUNTER — Inpatient Hospital Stay: Payer: Medicare Other

## 2019-06-07 DIAGNOSIS — D61818 Other pancytopenia: Secondary | ICD-10-CM | POA: Diagnosis not present

## 2019-06-07 DIAGNOSIS — C3490 Malignant neoplasm of unspecified part of unspecified bronchus or lung: Secondary | ICD-10-CM | POA: Diagnosis not present

## 2019-06-07 DIAGNOSIS — I82409 Acute embolism and thrombosis of unspecified deep veins of unspecified lower extremity: Secondary | ICD-10-CM | POA: Diagnosis not present

## 2019-06-07 DIAGNOSIS — S72009A Fracture of unspecified part of neck of unspecified femur, initial encounter for closed fracture: Secondary | ICD-10-CM | POA: Diagnosis not present

## 2019-06-08 ENCOUNTER — Encounter: Payer: Self-pay | Admitting: Internal Medicine

## 2019-06-09 ENCOUNTER — Ambulatory Visit: Payer: Medicare Other

## 2019-06-12 ENCOUNTER — Encounter: Payer: Self-pay | Admitting: Internal Medicine

## 2019-06-13 ENCOUNTER — Encounter: Payer: Self-pay | Admitting: Family Medicine

## 2019-06-13 ENCOUNTER — Telehealth: Payer: Self-pay | Admitting: Internal Medicine

## 2019-06-13 DIAGNOSIS — S72002D Fracture of unspecified part of neck of left femur, subsequent encounter for closed fracture with routine healing: Secondary | ICD-10-CM | POA: Diagnosis not present

## 2019-06-13 NOTE — Telephone Encounter (Signed)
R/s appt per 3/16 sch message - spoke with pt son and he is aware of change - will let pt know.

## 2019-06-14 ENCOUNTER — Inpatient Hospital Stay: Payer: Medicare Other

## 2019-06-14 ENCOUNTER — Encounter: Payer: Self-pay | Admitting: Internal Medicine

## 2019-06-14 ENCOUNTER — Inpatient Hospital Stay: Payer: Medicare Other | Admitting: Internal Medicine

## 2019-06-14 ENCOUNTER — Telehealth: Payer: Self-pay

## 2019-06-14 DIAGNOSIS — N183 Chronic kidney disease, stage 3 unspecified: Secondary | ICD-10-CM | POA: Diagnosis not present

## 2019-06-14 DIAGNOSIS — Z7901 Long term (current) use of anticoagulants: Secondary | ICD-10-CM | POA: Diagnosis not present

## 2019-06-14 DIAGNOSIS — D696 Thrombocytopenia, unspecified: Secondary | ICD-10-CM | POA: Diagnosis not present

## 2019-06-14 DIAGNOSIS — D631 Anemia in chronic kidney disease: Secondary | ICD-10-CM | POA: Diagnosis not present

## 2019-06-14 DIAGNOSIS — S72032D Displaced midcervical fracture of left femur, subsequent encounter for closed fracture with routine healing: Secondary | ICD-10-CM | POA: Diagnosis not present

## 2019-06-14 DIAGNOSIS — I129 Hypertensive chronic kidney disease with stage 1 through stage 4 chronic kidney disease, or unspecified chronic kidney disease: Secondary | ICD-10-CM | POA: Diagnosis not present

## 2019-06-14 DIAGNOSIS — W010XXD Fall on same level from slipping, tripping and stumbling without subsequent striking against object, subsequent encounter: Secondary | ICD-10-CM | POA: Diagnosis not present

## 2019-06-14 DIAGNOSIS — D630 Anemia in neoplastic disease: Secondary | ICD-10-CM | POA: Diagnosis not present

## 2019-06-14 DIAGNOSIS — D126 Benign neoplasm of colon, unspecified: Secondary | ICD-10-CM | POA: Diagnosis not present

## 2019-06-14 DIAGNOSIS — E785 Hyperlipidemia, unspecified: Secondary | ICD-10-CM | POA: Diagnosis not present

## 2019-06-14 DIAGNOSIS — R519 Headache, unspecified: Secondary | ICD-10-CM | POA: Diagnosis not present

## 2019-06-14 DIAGNOSIS — E039 Hypothyroidism, unspecified: Secondary | ICD-10-CM | POA: Diagnosis not present

## 2019-06-14 DIAGNOSIS — C342 Malignant neoplasm of middle lobe, bronchus or lung: Secondary | ICD-10-CM | POA: Diagnosis not present

## 2019-06-14 DIAGNOSIS — K5790 Diverticulosis of intestine, part unspecified, without perforation or abscess without bleeding: Secondary | ICD-10-CM | POA: Diagnosis not present

## 2019-06-14 DIAGNOSIS — Z9181 History of falling: Secondary | ICD-10-CM | POA: Diagnosis not present

## 2019-06-14 DIAGNOSIS — Z96642 Presence of left artificial hip joint: Secondary | ICD-10-CM | POA: Diagnosis not present

## 2019-06-14 NOTE — Telephone Encounter (Signed)
Do we need in-office visit to satisfy face to face requirement for [facility], or can they schedule a virtual visit for this? I think we should be able to do virtual visit.  Please schedule next available 30 min appt with PCP.

## 2019-06-14 NOTE — Telephone Encounter (Signed)
Verbal orders given to [facility] for [facility] PT 2 x a wk for 2 wks and 1 x a wk for 3 wks after pts hip fx per Dr. Lorelei Pont.

## 2019-06-14 NOTE — Telephone Encounter (Signed)
Yes, I agree  Dr. Damita Dunnings will be the managing physician

## 2019-06-14 NOTE — Telephone Encounter (Signed)
[name] PT with [facility] left v/m requesting verbal orders for [facility] PT 2 x a wk for 2 wks and 1 x a wk for 3 wks after pts hip fx. Dr Damita Dunnings is out of office this week and sending request to Dr Lorelei Pont.

## 2019-06-15 ENCOUNTER — Telehealth: Payer: Self-pay

## 2019-06-15 NOTE — Telephone Encounter (Signed)
Erlene Quan OT with [facility] left v/m requesting verbal orders for [facility] OT 2 x a wk for 4 wks.

## 2019-06-15 NOTE — Telephone Encounter (Signed)
Gave the approval for the verbal orders 

## 2019-06-15 NOTE — Telephone Encounter (Signed)
Approved.  

## 2019-06-15 NOTE — Telephone Encounter (Signed)
plz schedule next available 32min with PCP plz schedule virtual visit (if they can do video). If not make it an in-person OV.

## 2019-06-16 ENCOUNTER — Encounter: Payer: Self-pay | Admitting: Family Medicine

## 2019-06-16 NOTE — Progress Notes (Signed)
Pharmacist Chemotherapy Monitoring - Follow Up Assessment    I verify that I have reviewed each item in the below checklist:  . Regimen for the patient is scheduled for the appropriate day and plan matches scheduled date. Marland Kitchen Appropriate non-routine labs are ordered dependent on drug ordered. . If applicable, additional medications reviewed and ordered per protocol based on lifetime cumulative doses and/or treatment regimen.   Plan for follow-up and/or issues identified: Yes . I-vent associated with next due treatment: Yes . MD and/or nursing notified: No    Kennith Center, Pharm.D., CPP 06/16/2019@4 :56 PM

## 2019-06-17 ENCOUNTER — Encounter: Payer: Self-pay | Admitting: Family Medicine

## 2019-06-18 NOTE — Telephone Encounter (Signed)
Please make sure this printed.  I tried to print it at clinic.  Please get me the hardcopy.  I will need it for the visit.  Thanks.

## 2019-06-20 ENCOUNTER — Ambulatory Visit (INDEPENDENT_AMBULATORY_CARE_PROVIDER_SITE_OTHER): Payer: Medicare Other | Admitting: Family Medicine

## 2019-06-20 ENCOUNTER — Other Ambulatory Visit: Payer: Self-pay

## 2019-06-20 ENCOUNTER — Encounter: Payer: Self-pay | Admitting: Family Medicine

## 2019-06-20 ENCOUNTER — Encounter: Payer: Self-pay | Admitting: Internal Medicine

## 2019-06-20 DIAGNOSIS — S72002D Fracture of unspecified part of neck of left femur, subsequent encounter for closed fracture with routine healing: Secondary | ICD-10-CM

## 2019-06-20 NOTE — Progress Notes (Signed)
Virtual visit completed through WebEx or similar program Patient location: home  Provider location: Financial controller at [facility], office   Pandemic considerations d/w pt.   Limitations and rationale for visit method d/w patient.  Patient agreed to proceed.   CC: Follow-up.  HPI:  She has f/u with Dr. Julien Nordmann pending for lung cancer.  Inpatient tx then SNF at [facility] afterward for left hip fracture.  Left hip fracture -Status post mechanical fall. -Discussed with orthopedics, Dr. Griffin Basil. -S/p left hip hemi arthroplasty 3/3. -PT/OT recs SNF.   She had delerium in the hospital.  Was noted to be confused while in hospital.  She is now back at home with family support.  She has PT and OT at home.  Family is helping out with bathing.  Family has noted weakness in the midst of chemo tx.  She has limited mobility.  She needs help with toileting, transferring, bathing, dressing to reduce her fall risk.  She is unable to manage her finances and she has family help with that.    Meds and allergies reviewed.   ROS: Per HPI unless specifically indicated in ROS section   NAD Speech wnl She is pleasant in conversation but she has an abnormal MMSE.  Scores 22/30. She loses 4 points for orientation.  She did not know the year or month date or day.  0 out of 3 on recall.  She cannot copy overlapping pentagons.  A/P:  Left hip fracture.  Needs PT at home.  Needs family support and also additional help for bathing/toileting/dressing/transfers.  Form filled out for patient to get extra help at home.  This is entirely appropriate.  Family is supportive but they still need extra help.  Would continue with PT in the meantime.  She has history of delirium.  She has a history of memory loss.  See MMSE above.  Scored 22 out of 30.  I would not change her medications at this point.  I think the most important thing would be getting extra help in the home to try to increase her functional status is much as  possible.  I greatly appreciate the help of all involved.  We will get the hard copy scanned and then make the form available to family.  See scanned copy.

## 2019-06-21 ENCOUNTER — Encounter: Payer: Self-pay | Admitting: Medical Oncology

## 2019-06-21 NOTE — Assessment & Plan Note (Signed)
Left hip fracture.  Needs PT at home.  Needs family support and also additional help for bathing/toileting/dressing/transfers.  Form filled out for patient to get extra help at home.  This is entirely appropriate.  Family is supportive but they still need extra help.  Would continue with PT in the meantime.  She has history of delirium.  She has a history of memory loss.  See MMSE above.  Scored 22 out of 30.  I would not change her medications at this point.  I think the most important thing would be getting extra help in the home to try to increase her functional status is much as possible.  I greatly appreciate the help of all involved.  We will get the hard copy scanned and then make the form available to family.  See scanned copy.  At least 30 minutes were devoted to patient care in this encounter (this can potentially include time spent reviewing the patient's file/history, interviewing and examining the patient, counseling/reviewing plan with patient, ordering referrals, ordering tests, reviewing relevant laboratory or x-ray data, and documenting the encounter).

## 2019-06-21 NOTE — Progress Notes (Deleted)
[facility] OFFICE PROGRESS NOTE  [name], MD [place] [address]  DIAGNOSIS: Recurrent non-small cell lung cancer initially diagnosed stage IIIB (T3, N3, M0) non-small cell lung cancer, squamous cell carcinoma presented with large right middle lobe lung mass in addition to right hilar, subcarinal and right supraclavicular lymphadenopathy diagnosed in August 2019.The patient had evidence for disease progression in December 2020.  PRIOR THERAPY: 1) Concurrent chemoradiation with weekly carboplatin for AUC of 2 and paclitaxel 45 NG/M2. Status post 6 cycles with partial response. 2) Consolidation immunotherapy with Imfinzi 10 mg/KG every 2 weeks started February 28, 2018. Status post 26 cycles. Last cycle was given on February 21, 2019  CURRENT THERAPY: Systemic chemotherapy with carboplatin for an AUC of 5, paclitaxel 175 mg/m and Keytruda 200 mg IV every 3 weeks with Neulasta support.  First dose on April 05, 2018. Status post 3 cycles.   INTERVAL HISTORY: Lisa [name] 86 y.o. female returns to the clinic today for a follow up visit accompanied by __. She had a mechanical fall on 05/30/19 and a left hip fracture. She underwent a left hip hemiarthroplasty under the care of Dr. Griffin Basil on 05/31/19. She was discharged to a SNF for rehab. She is currently at home.   She is here today to resume her chemotherapy. She is feeling __ today without any concerning complaints except for __. She denies any fever, chills, night sweats, or weight loss. She denies any chest pain, shortness of breath, or cough. Denies nausea, vomiting, diarrhea, or constipation. Denies headaches or visual changes. She was supposed to have a restaging CT scan but was not able to have that performed due to her recent fall and surgery. She is here for evaluation before starting cycle #4.  Marland Kitchen  MEDICAL HISTORY: Past Medical History:  Diagnosis Date  . Diverticulosis   . Headache   .  Hemorrhoids    internal and external  . Hyperlipidemia 01/29/1991  . Hypertension 10/29/1995  . Hypothyroidism 10/29/1995  . Lung mass    right middle lobe  . Melanoma ([place])    h/o, local excision. no chemo or rady.   . Skin cancer (melanoma) ([place])   . Tubular adenoma of colon   . Wears dentures     ALLERGIES:  is allergic to atorvastatin; celebrex [celecoxib]; cholecalciferol; simvastatin; and tape.  MEDICATIONS:  Current Outpatient Medications  Medication Sig Dispense Refill  . albuterol (VENTOLIN HFA) 108 (90 Base) MCG/ACT inhaler Inhale 2 puffs into the lungs every 6 (six) hours as needed for wheezing or shortness of breath. 6.7 g 0  . amLODipine (NORVASC) 10 MG tablet Take 1 tablet by mouth once daily 90 tablet 2  . Cholecalciferol (VITAMIN D3) 50 MCG (2000 UT) TABS Take 2,000 Units by mouth 3 (three) times a week.    . Coenzyme Q10 100 MG capsule Take 100 mg by mouth daily.     Marland Kitchen docusate sodium (COLACE) 100 MG capsule Take 100 mg by mouth daily as needed for mild constipation.    . fluticasone (FLONASE) 50 MCG/ACT nasal spray USE 2 SPRAYS IN EACH NOSTRIL DAILY AS NEEDED FOR ALLERGIES 48 g 3  . levothyroxine (SYNTHROID, LEVOTHROID) 75 MCG tablet Take 1 tablet by mouth once daily 90 tablet 3  . loratadine (CLARITIN) 10 MG tablet Take 1 tablet (10 mg total) by mouth daily as needed for allergies or rhinitis.    . mirtazapine (REMERON) 7.5 MG tablet Take 1 tablet (7.5  mg total) by mouth at bedtime. 30 tablet 1  . Multiple Vitamin (MULTIVITAMIN WITH MINERALS) TABS tablet Take 1 tablet by mouth daily.    . sennosides-docusate sodium (SENOKOT-S) 8.6-50 MG tablet Take 1 tablet by mouth daily as needed for constipation.    [name] 20 MG TABS tablet TAKE 1 TABLET BY MOUTH ONCE DAILY WITH SUPPER 30 tablet 0   No current facility-administered medications for this visit.   Facility-Administered Medications Ordered in Other Visits  Medication Dose Route Frequency Provider Last Rate Last  Admin  . acetaminophen (TYLENOL) tablet 650 mg  650 mg Oral Once Nicholas Lose, MD        SURGICAL HISTORY:  Past Surgical History:  Procedure Laterality Date  . BRONCHIAL BIOPSY  11/23/2017   Procedure: BRONCHIAL BIOPSIES;  Surgeon: Grace Isaac, MD;  Location: [facility] OR;  Service: Thoracic;;  . cataract surgery  2007, 2008   repair bilat  . DG BARIUM ENEMA ([place] HX) N/A 2016   [name]  . HERNIA REPAIR  04/25/2001  . HIP ARTHROPLASTY Left 05/31/2019   Procedure: LEFT HIP HEMIARTHROPLASTY;  Surgeon: Hiram Gash, MD;  Location: [place] [facility];  Service: Orthopedics;  Laterality: Left;  . IR IMAGING GUIDED PORT INSERTION  07/05/2018  . IR US GUIDE BX ASP/DRAIN  11/17/2017  . KNEE SURGERY     meniscus tear per Dr. Ronnie Derby, R knee  . MULTIPLE TOOTH EXTRACTIONS    . SEPTOPLASTY  2006   Dr. Ernesto Rutherford  . SKIN CANCER EXCISION    . TONSILLECTOMY  1954  . VAGINAL HYSTERECTOMY    . VIDEO BRONCHOSCOPY WITH ENDOBRONCHIAL ULTRASOUND N/A 11/23/2017   Procedure: VIDEO BRONCHOSCOPY WITH ENDOBRONCHIAL ULTRASOUND;  Surgeon: Grace Isaac, MD;  Location: [facility];  Service: Thoracic;  Laterality: N/A;    REVIEW OF SYSTEMS:   Review of Systems  Constitutional: Negative for appetite change, chills, fatigue, fever and unexpected weight change.  HENT:   Negative for mouth sores, nosebleeds, sore throat and trouble swallowing.   Eyes: Negative for eye problems and icterus.  Respiratory: Negative for cough, hemoptysis, shortness of breath and wheezing.   Cardiovascular: Negative for chest pain and leg swelling.  Gastrointestinal: Negative for abdominal pain, constipation, diarrhea, nausea and vomiting.  Genitourinary: Negative for bladder incontinence, difficulty urinating, dysuria, frequency and hematuria.   Musculoskeletal: Negative for back pain, gait problem, neck pain and neck stiffness.  Skin: Negative for itching and rash.  Neurological: Negative for dizziness, extremity weakness, gait problem,  headaches, light-headedness and seizures.  Hematological: Negative for adenopathy. Does not bruise/bleed easily.  Psychiatric/Behavioral: Negative for confusion, depression and sleep disturbance. The patient is not nervous/anxious.     PHYSICAL EXAMINATION:  There were no vitals taken for this visit.  ECOG PERFORMANCE STATUS: {CHL ONC ECOG [id number]  Physical Exam  Constitutional: Oriented to person, place, and time and well-developed, well-nourished, and in no distress. No distress.  HENT:  Head: Normocephalic and atraumatic.  Mouth/Throat: Oropharynx is clear and moist. No oropharyngeal exudate.  Eyes: Conjunctivae are normal. Right eye exhibits no discharge. Left eye exhibits no discharge. No scleral icterus.  Neck: Normal range of motion. Neck supple.  Cardiovascular: Normal rate, regular rhythm, normal heart sounds and intact distal pulses.   Pulmonary/Chest: Effort normal and breath sounds normal. No respiratory distress. No wheezes. No rales.  Abdominal: Soft. Bowel sounds are normal. Exhibits no distension and no mass. There is no tenderness.  Musculoskeletal: Normal range of motion. Exhibits no edema.  Lymphadenopathy:  No cervical adenopathy.  Neurological: Alert and oriented to person, place, and time. Exhibits normal muscle tone. Gait normal. Coordination normal.  Skin: Skin is warm and dry. No rash noted. Not diaphoretic. No erythema. No pallor.  Psychiatric: Mood, memory and judgment normal.  Vitals reviewed.  LABORATORY DATA: Lab Results  Component Value Date   WBC 6.2 06/03/2019   HGB 9.1 (L) 06/03/2019   HCT 28.7 (L) 06/03/2019   MCV 97.0 06/03/2019   PLT 63 (L) 06/03/2019      Chemistry      Component Value Date/Time   NA 143 06/03/2019 0558   K 3.7 06/03/2019 0558   CL 110 06/03/2019 0558   CO2 24 06/03/2019 0558   BUN 23 06/03/2019 0558   CREATININE 1.30 (H) 06/03/2019 0558   CREATININE 1.31 (H) 05/29/2019 1258      Component Value Date/Time    CALCIUM 8.6 (L) 06/03/2019 0558   ALKPHOS 88 05/31/2019 0802   AST 27 05/31/2019 0802   AST 20 05/29/2019 1258   ALT 16 05/31/2019 0802   ALT 11 05/29/2019 1258   BILITOT 0.5 05/31/2019 0802   BILITOT 0.3 05/29/2019 1258       RADIOGRAPHIC STUDIES:  DG Chest 1 View  Result Date: 05/30/2019 CLINICAL DATA:  Fall, no chest complaints EXAM: CHEST  1 VIEW COMPARISON:  CT 03/07/2019, PET-CT 03/21/2019 FINDINGS: Large right infrahilar soft tissue attenuation compatible with previously seen FDG avid tissue, most compatible with recurrent lung malignancy. Obscuration of the right hemidiaphragm may reflect chronic right effusion seen on comparison studies. Additional atelectatic changes in the right lung base are likely present as well. Some scarring reticular changes is present in the left lung base as well, similar to comparison cross-sectional imaging. No new consolidative opacity is seen in the lungs. Cardiomediastinal contours are unremarkable for portable technique with a calcified, tortuous aorta. No acute osseous or soft tissue abnormality. Degenerative changes are present in the imaged spine and shoulders. Right IJ approach Port-A-Cath tip terminates at the level of the right atrium. IMPRESSION: Right perihilar attenuation compatible with recurrent malignancy seen on comparison PET-CT. Lung volume loss and chronic right effusion in the right lung base are likely similar to prior. No new acute cardiopulmonary abnormality or traumatic findings in the chest. Electronically Signed   By: Lovena Le M.D.   On: 05/30/2019 20:00   DG Hip Port Unilat With Pelvis 1V Left  Result Date: 05/31/2019 CLINICAL DATA:  86 year old female with left hip arthroplasty. EXAM: DG HIP (WITH OR WITHOUT PELVIS) 1V PORT LEFT COMPARISON:  Left knee radiograph dated 05/30/2019. FINDINGS: There is a left hip arthroplasty. The arthroplasty appears intact. There is no acute fracture or dislocation. The bones are osteopenic.  Postsurgical changes in the soft tissues of the left hip. IMPRESSION: Left hip arthroplasty.  No immediate complication. Electronically Signed   By: Anner Crete M.D.   On: 05/31/2019 18:31   DG Hip Unilat With Pelvis 2-3 Views Left  Result Date: 05/30/2019 CLINICAL DATA:  Mechanical fall, left hip pain EXAM: DG HIP (WITH OR WITHOUT PELVIS) 2-3V LEFT COMPARISON:  PET-CT 03/21/2019 FINDINGS: Mildly foreshortened transcervical left femoral neck fracture with associated left hip effusion. Left femoral head remains normally located. Remaining bones of the pelvis are intact and congruent. Bilateral hip arthrosis as well as additional degenerative changes in the lower lumbar spine, SI joints and symphysis pubis are noted. Stable likely bone island seen in the posterior right acetabulum. Vascular calcium noted in the pelvis.  Remaining soft tissues are unremarkable. IMPRESSION: Mildly foreshortened transcervical left femoral neck fracture with associated left hip effusion. Electronically Signed   By: Lovena Le M.D.   On: 05/30/2019 19:57     ASSESSMENT/PLAN:  This is a very pleasant 86 year old Caucasian female with recurrent non-small cell lung cancer initially diagnosed stage IIIB (T3, N3, M0) non-small cell lung cancer, squamous cell carcinoma presented with large right middle lobe lung mass in addition to right hilar, subcarinal and right supraclavicular lymphadenopathy diagnosed in August 2019.The patient had evidence for disease progression in December 2020.  She completed a course of concurrent chemoradiation with carboplatin for an AUC of 2 and paclitaxel 5 mg/m.  She is status post 6 cycles with a partial response.  She then completed consolidation immunotherapy with Imfinzi 10 mg/kg IV every 2 weeks.  She is status post 26 cycles.  She showed evidence of disease progression on her restaging CT scan.  The patient is currently undergoing treatment with carboplatin for an AUC of 5,  paclitaxel 175 mg/m, Keytruda 200 mg IV every 3 weeks with Neulasta support. She is status post 3 cycles.   She was recently hospitalized for a hip fracture.   The patient was seen with Dr. Julien Nordmann. Labs were reviewed. Recommend that she proceed with cycle #4 today as scheduled.   Scan  The patient was advised to call immediately if she has any concerning symptoms in the interval. The patient voices understanding of current disease status and treatment options and is in agreement with the current care plan. All questions were answered. The patient knows to call the clinic with any problems, questions or concerns. We can certainly see the patient much sooner if necessary        No orders of the defined types were placed in this encounter.    Hai Grabe L Chara Marquard, PA-C 06/21/19

## 2019-06-22 ENCOUNTER — Encounter: Payer: Self-pay | Admitting: Family Medicine

## 2019-06-22 ENCOUNTER — Inpatient Hospital Stay: Payer: Medicare Other | Admitting: Physician Assistant

## 2019-06-22 ENCOUNTER — Inpatient Hospital Stay: Payer: Medicare Other

## 2019-06-22 ENCOUNTER — Telehealth: Payer: Self-pay

## 2019-06-22 NOTE — Telephone Encounter (Signed)
Opened in erro

## 2019-06-22 NOTE — Progress Notes (Signed)
Pharmacist Chemotherapy Monitoring - Follow Up Assessment    I verify that I have reviewed each item in the below checklist:  . Regimen for the patient is scheduled for the appropriate day and plan matches scheduled date. Marland Kitchen Appropriate non-routine labs are ordered dependent on drug ordered. . If applicable, additional medications reviewed and ordered per protocol based on lifetime cumulative doses and/or treatment regimen.   Plan for follow-up and/or issues identified: Yes . I-vent associated with next due treatment: Yes . MD and/or nursing notified: No    Kennith Center, Pharm.D., CPP 06/22/2019@4 :29 PM

## 2019-06-23 NOTE — Telephone Encounter (Signed)
Made copy of paperwork for scanning and placed copy at front office for pick up.

## 2019-06-26 ENCOUNTER — Ambulatory Visit (HOSPITAL_COMMUNITY)
Admission: RE | Admit: 2019-06-26 | Discharge: 2019-06-26 | Disposition: A | Discharge status: Home / Self Care | Payer: Medicare Other | Source: Ambulatory Visit | Attending: Internal Medicine | Admitting: Internal Medicine

## 2019-06-26 ENCOUNTER — Other Ambulatory Visit: Payer: Self-pay

## 2019-06-26 ENCOUNTER — Encounter (HOSPITAL_COMMUNITY): Payer: Self-pay

## 2019-06-26 DIAGNOSIS — C3490 Malignant neoplasm of unspecified part of unspecified bronchus or lung: Secondary | ICD-10-CM | POA: Diagnosis not present

## 2019-06-27 ENCOUNTER — Telehealth: Payer: Self-pay | Admitting: Internal Medicine

## 2019-06-27 DIAGNOSIS — S72002D Fracture of unspecified part of neck of left femur, subsequent encounter for closed fracture with routine healing: Secondary | ICD-10-CM | POA: Diagnosis not present

## 2019-06-27 NOTE — Progress Notes (Signed)
[facility] OFFICE PROGRESS NOTE  [name], MD [place] [id number]  DIAGNOSIS: Recurrent non-small cell lung cancer initially diagnosed stage IIIB (T3, N3, M0) non-small cell lung cancer, squamous cell carcinoma presented with large right middle lobe lung mass in addition to right hilar, subcarinal and right supraclavicular lymphadenopathy diagnosed in August 2019.The patient had evidence for disease progression in December 2020.  PRIOR THERAPY:  1) Concurrent chemoradiation with weekly carboplatin for AUC of 2 and paclitaxel 45 NG/M2. Status post 6 cycles with partial response. 2) Consolidation immunotherapy with Imfinzi 10 mg/KG every 2 weeks started February 28, 2018. Status post 26 cycles. Last cycle was given on February 21, 2019  CURRENT THERAPY:  Systemic chemotherapy with carboplatin for an AUC of 5, paclitaxel 175 mg/m and Keytruda 200 mg IV every 3 weeks with Neulasta support.  First dose on April 05, 2018. Status post 3 cycles.   INTERVAL HISTORY: Lisa [name] 86 y.o. female returns to the clinic for a follow up visit accompanied by her grandson. The patient recently had a mechanical fall after tripping a restaurant.  She had a left hip fracture status post a left hip hemiarthroplasty under the care of Dr. Griffin Basil.  She then went to a skilled nursing facility for rehab.  She is currently at home.  She feels worn out today but otherwise is feeling well.  She denies any fever, chills, night sweats, or weight loss.  She denies any chest pain, shortness of breath, cough, or hemoptysis.  She denies any nausea, vomiting, diarrhea, or constipation.  She denies any headache or visual changes.  She denies any rashes or skin changes.  She really had a restaging CT scan performed.  She is here today for evaluation and to review her scan results before starting cycle #4.     MEDICAL HISTORY: Past Medical History:  Diagnosis Date  .  Diverticulosis   . Headache   . Hemorrhoids    internal and external  . Hyperlipidemia 01/29/1991  . Hypertension 10/29/1995  . Hypothyroidism 10/29/1995  . Lung mass    right middle lobe  . Melanoma ([facility])    h/o, local excision. no chemo or rady.   . Skin cancer (melanoma) ([facility])   . Tubular adenoma of colon   . Wears dentures     ALLERGIES:  is allergic to atorvastatin; celebrex [celecoxib]; cholecalciferol; simvastatin; and tape.  MEDICATIONS:  Current Outpatient Medications  Medication Sig Dispense Refill  . albuterol (VENTOLIN HFA) 108 (90 Base) MCG/ACT inhaler Inhale 2 puffs into the lungs every 6 (six) hours as needed for wheezing or shortness of breath. 6.7 g 0  . amLODipine (NORVASC) 10 MG tablet Take 1 tablet by mouth once daily 90 tablet 2  . Cholecalciferol (VITAMIN D3) 50 MCG (2000 UT) TABS Take 2,000 Units by mouth 3 (three) times a week.    . Coenzyme Q10 100 MG capsule Take 100 mg by mouth daily.     Marland Kitchen docusate sodium (COLACE) 100 MG capsule Take 100 mg by mouth daily as needed for mild constipation.    . fluticasone (FLONASE) 50 MCG/ACT nasal spray USE 2 SPRAYS IN EACH NOSTRIL DAILY AS NEEDED FOR ALLERGIES 48 g 3  . levothyroxine (SYNTHROID, LEVOTHROID) 75 MCG tablet Take 1 tablet by mouth once daily 90 tablet 3  . loratadine (CLARITIN) 10 MG tablet Take 1 tablet (10 mg total) by mouth daily as needed for allergies or rhinitis.    Marland Kitchen  mirtazapine (REMERON) 7.5 MG tablet Take 1 tablet (7.5 mg total) by mouth at bedtime. 30 tablet 1  . Multiple Vitamin (MULTIVITAMIN WITH MINERALS) TABS tablet Take 1 tablet by mouth daily.    . sennosides-docusate sodium (SENOKOT-S) 8.6-50 MG tablet Take 1 tablet by mouth daily as needed for constipation.    [name] 20 MG TABS tablet TAKE 1 TABLET BY MOUTH ONCE DAILY WITH SUPPER 30 tablet 0   No current facility-administered medications for this visit.   Facility-Administered Medications Ordered in Other Visits  Medication Dose Route  Frequency Provider Last Rate Last Admin  . acetaminophen (TYLENOL) tablet 650 mg  650 mg Oral Once Nicholas Lose, MD        SURGICAL HISTORY:  Past Surgical History:  Procedure Laterality Date  . BRONCHIAL BIOPSY  11/23/2017   Procedure: BRONCHIAL BIOPSIES;  Surgeon: Grace Isaac, MD;  Location: [facility] OR;  Service: Thoracic;;  . cataract surgery  2007, 2008   repair bilat  . DG BARIUM ENEMA ([place] HX) N/A 2016   [name]  . HERNIA REPAIR  04/25/2001  . HIP ARTHROPLASTY Left 05/31/2019   Procedure: LEFT HIP HEMIARTHROPLASTY;  Surgeon: Hiram Gash, MD;  Location: [facility];  Service: Orthopedics;  Laterality: Left;  . IR IMAGING GUIDED PORT INSERTION  07/05/2018  . IR US GUIDE BX ASP/DRAIN  11/17/2017  . KNEE SURGERY     meniscus tear per Dr. Ronnie Derby, R knee  . MULTIPLE TOOTH EXTRACTIONS    . SEPTOPLASTY  2006   Dr. Ernesto Rutherford  . SKIN CANCER EXCISION    . TONSILLECTOMY  1954  . VAGINAL HYSTERECTOMY    . VIDEO BRONCHOSCOPY WITH ENDOBRONCHIAL ULTRASOUND N/A 11/23/2017   Procedure: VIDEO BRONCHOSCOPY WITH ENDOBRONCHIAL ULTRASOUND;  Surgeon: Grace Isaac, MD;  Location: [facility];  Service: Thoracic;  Laterality: N/A;    REVIEW OF SYSTEMS:   Review of Systems  Constitutional: Negative for appetite change, chills, fatigue, fever and unexpected weight change.  HENT: Negative for mouth sores, nosebleeds, sore throat and trouble swallowing.   Eyes: Negative for eye problems and icterus.  Respiratory: Negative for cough, hemoptysis, shortness of breath and wheezing.  Cardiovascular: Negative for chest pain and leg swelling.  Gastrointestinal: Negative for abdominal pain, constipation, diarrhea, nausea and vomiting.  Genitourinary: Negative for bladder incontinence, difficulty urinating, dysuria, frequency and hematuria.   Musculoskeletal: Negative for back pain, gait problem, neck pain and neck stiffness.  Skin: Negative for itching and rash.  Neurological: Negative for dizziness, extremity  weakness, gait problem, headaches, light-headedness and seizures.  Hematological: Negative for adenopathy. Does not bruise/bleed easily.  Psychiatric/Behavioral: Negative for confusion, depression and sleep disturbance. The patient is not nervous/anxious.     PHYSICAL EXAMINATION:  Blood pressure 116/88, pulse 92, temperature 98.5 F (36.9 C), temperature source Temporal, resp. rate 17, height 5\' 7"  (1.702 m), weight 148 lb 14.4 oz (67.5 kg), SpO2 100 %.  ECOG PERFORMANCE STATUS: 1 - Symptomatic but completely ambulatory  Physical Exam  Constitutional: Oriented to person, place, and time and well-developed, well-nourished, and in no distress.  HENT:  Head: Normocephalic and atraumatic.  Mouth/Throat: Oropharynx is clear and moist. No oropharyngeal exudate.  Eyes: Conjunctivae are normal. Right eye exhibits no discharge. Left eye exhibits no discharge. No scleral icterus.  Neck: Normal range of motion. Neck supple.  Cardiovascular: Normal rate, regular rhythm, normal heart sounds and intact distal pulses.   Pulmonary/Chest: Effort normal and breath sounds normal. No respiratory distress. No wheezes. No rales.  Abdominal: Soft. Bowel sounds are normal. Exhibits no distension and no mass. There is no tenderness.  Musculoskeletal: Normal range of motion. Bilateral lower extremity edema L>R. No calf pain or tenderness.  Lymphadenopathy:    No cervical adenopathy.  Neurological: Alert and oriented to person, place, and time. Exhibits normal muscle tone. Gait normal. Coordination normal.  Skin: Skin is warm and dry. No rash noted. Not diaphoretic. No erythema. No pallor.  Psychiatric: Mood, memory and judgment normal.  Vitals reviewed.  LABORATORY DATA: Lab Results  Component Value Date   WBC 5.0 06/28/2019   HGB 8.6 (L) 06/28/2019   HCT 28.1 (L) 06/28/2019   MCV 97.2 06/28/2019   PLT 131 (L) 06/28/2019      Chemistry      Component Value Date/Time   NA 143 06/03/2019 0558   K 3.7  06/03/2019 0558   CL 110 06/03/2019 0558   CO2 24 06/03/2019 0558   BUN 23 06/03/2019 0558   CREATININE 1.30 (H) 06/03/2019 0558   CREATININE 1.31 (H) 05/29/2019 1258      Component Value Date/Time   CALCIUM 8.6 (L) 06/03/2019 0558   ALKPHOS 88 05/31/2019 0802   AST 27 05/31/2019 0802   AST 20 05/29/2019 1258   ALT 16 05/31/2019 0802   ALT 11 05/29/2019 1258   BILITOT 0.5 05/31/2019 0802   BILITOT 0.3 05/29/2019 1258       RADIOGRAPHIC STUDIES:  DG Chest 1 View  Result Date: 05/30/2019 CLINICAL DATA:  Fall, no chest complaints EXAM: CHEST  1 VIEW COMPARISON:  CT 03/07/2019, PET-CT 03/21/2019 FINDINGS: Large right infrahilar soft tissue attenuation compatible with previously seen FDG avid tissue, most compatible with recurrent lung malignancy. Obscuration of the right hemidiaphragm may reflect chronic right effusion seen on comparison studies. Additional atelectatic changes in the right lung base are likely present as well. Some scarring reticular changes is present in the left lung base as well, similar to comparison cross-sectional imaging. No new consolidative opacity is seen in the lungs. Cardiomediastinal contours are unremarkable for portable technique with a calcified, tortuous aorta. No acute osseous or soft tissue abnormality. Degenerative changes are present in the imaged spine and shoulders. Right IJ approach Port-A-Cath tip terminates at the level of the right atrium. IMPRESSION: Right perihilar attenuation compatible with recurrent malignancy seen on comparison PET-CT. Lung volume loss and chronic right effusion in the right lung base are likely similar to prior. No new acute cardiopulmonary abnormality or traumatic findings in the chest. Electronically Signed   By: Lovena Le M.D.   On: 05/30/2019 20:00   CT Chest Wo Contrast  Result Date: 06/26/2019 CLINICAL DATA:  Restaging lung cancer. EXAM: CT CHEST WITHOUT CONTRAST TECHNIQUE: Multidetector CT imaging of the chest was  performed following the standard protocol without IV contrast. COMPARISON:  PET-CT 03/21/2019 and CT chest 03/07/2019 FINDINGS: Cardiovascular: There is aneurysmal dilatation of the ascending thoracic aorta which measures up to 5 cm, image 77/2. Aortic atherosclerosis. Mediastinum/Nodes: Thyroid gland is either atrophic or surgically absent. The trachea appears patent and is midline. No mediastinal adenopathy. Hilar structures are suboptimally evaluated due to lack of IV contrast material. Normal appearance of the esophagus. Lungs/Pleura: Small to moderate right pleural effusion is increased in volume from previous exam. Again seen are changes secondary to external beam radiation involving the perihilar right lung. The FDG avid perihilar lung mass is similar to the previous exam measuring approximately 6.3 x 4.8 cm, image 73/2. Previously 6.6 x 4.6 cm similar appearance  of tiny, nonspecific right lung nodules. For example right middle lobe lung nodule measures 2 mm, image 54/7. Upper Abdomen: No acute abnormality. Musculoskeletal: Scoliosis and mild degenerative disc disease. Unchanged chronic compression deformity involving the T12 inferior endplate with 06% loss of the anterior vertebral body height. No acute or suspicious osseous findings IMPRESSION: 1. Stable appearance of FDG avid right perihilar lung mass compatible with recurrent neoplasm in the central aspect of the right lung. No new sites of disease identified at this time. 2. Increase in volume of right pleural effusion. 3. Stable ascending thoracic aortic aneurysm. Ascending thoracic aortic aneurysm. Recommend semi-annual imaging followup by CTA or MRA and referral to cardiothoracic surgery if not already obtained. This recommendation follows 2010 ACCF/AHA/AATS/ACR/ASA/SCA/SCAI/SIR/STS/SVM Guidelines for the Diagnosis and Management of Patients With Thoracic Aortic Disease. Circulation. 2010; 121: T016-W109. Aortic aneurysm NOS (ICD10-I71.9) Aortic  Atherosclerosis (ICD10-I70.0). Aortic aneurysm NOS (ICD10-I71.9). Electronically Signed   By: Kerby Moors M.D.   On: 06/26/2019 15:37   DG Hip Port Unilat With Pelvis 1V Left  Result Date: 05/31/2019 CLINICAL DATA:  86 year old female with left hip arthroplasty. EXAM: DG HIP (WITH OR WITHOUT PELVIS) 1V PORT LEFT COMPARISON:  Left knee radiograph dated 05/30/2019. FINDINGS: There is a left hip arthroplasty. The arthroplasty appears intact. There is no acute fracture or dislocation. The bones are osteopenic. Postsurgical changes in the soft tissues of the left hip. IMPRESSION: Left hip arthroplasty.  No immediate complication. Electronically Signed   By: Anner Crete M.D.   On: 05/31/2019 18:31   DG Hip Unilat With Pelvis 2-3 Views Left  Result Date: 05/30/2019 CLINICAL DATA:  Mechanical fall, left hip pain EXAM: DG HIP (WITH OR WITHOUT PELVIS) 2-3V LEFT COMPARISON:  PET-CT 03/21/2019 FINDINGS: Mildly foreshortened transcervical left femoral neck fracture with associated left hip effusion. Left femoral head remains normally located. Remaining bones of the pelvis are intact and congruent. Bilateral hip arthrosis as well as additional degenerative changes in the lower lumbar spine, SI joints and symphysis pubis are noted. Stable likely bone island seen in the posterior right acetabulum. Vascular calcium noted in the pelvis. Remaining soft tissues are unremarkable. IMPRESSION: Mildly foreshortened transcervical left femoral neck fracture with associated left hip effusion. Electronically Signed   By: Lovena Le M.D.   On: 05/30/2019 19:57     ASSESSMENT/PLAN:  This is a very pleasant 86 year old Caucasian female with recurrent non-small cell lung cancer initially diagnosed stage IIIB (T3, N3, M0) non-small cell lung cancer, squamous cell carcinoma presented with large right middle lobe lung mass in addition to right hilar, subcarinal and right supraclavicular lymphadenopathy diagnosed in August  2019.The patient had evidence for disease progression in December 2020.  She completed a course of concurrent chemoradiation with carboplatin for an AUC of 2 and paclitaxel 5 mg/m.  She is status post 6 cycles with a partial response.  She then completed consolidation immunotherapy with Imfinzi 10 mg/kg IV every 2 weeks.  She is status post 26 cycles.  She showed evidence of disease progression on her restaging CT scan.  The patient is currently undergoing treatment with carboplatin for an AUC of 5, paclitaxel 175 mg/m, Keytruda 200 mg IV every 3 weeks with Neulasta support.  She is status post 3 cycles. She recently was hospitalized for a left hip fracture. Her treatment was on hold while she was recovering.   The patient recently had a restaging CT scan performed. Dr. Julien Nordmann personally and independently reviewed the scan and discussed the results with the  patient. The scan was stable and did not show any evidence of disease progression. Recommend that she proceed with cycle #4 today as scheduled.   We will see her back for a follow up visit in 3 weeks for evaluation before starting cycle #5.   The patient was advised to call immediately if she has any concerning symptoms in the interval. The patient voices understanding of current disease status and treatment options and is in agreement with the current care plan. All questions were answered. The patient knows to call the clinic with any problems, questions or concerns. We can certainly see the patient much sooner if necessary      No orders of the defined types were placed in this encounter.    Cornellius Kropp L Cydnie Deason, PA-C 06/28/19  ADDENDUM: Hematology/Oncology Attending: I had a face-to-face encounter with the patient today.  I recommended her care plan.  This is a very pleasant 86 years old white female with recurrent non-small cell lung cancer, squamous cell carcinoma.  She is currently undergoing systemic chemotherapy with  carboplatin, paclitaxel and Keytruda status post 3 cycles.  She tolerated the first 3 cycles of her treatment well.  Her cycle #4 was delayed by several weeks secondary to a fall and fracture of her hip followed by rehabilitation. She is here today for reevaluation with repeat CT scan of the chest, abdomen pelvis for restaging of her disease before resuming her chemotherapy. I personally and independently reviewed the scans and discussed the results with the patient and her grandson. Her scan showed no concerning findings for disease progression. I recommended for the patient to continue her current treatment and she will proceed with cycle #4 today. Starting cycle #5 she will be treated with maintenance treatment with single agent Keytruda. The patient will come back for follow-up visit in 3 weeks for evaluation before the next cycle of her treatment. She was advised to call immediately if she has any concerning symptoms in the interval.  Disclaimer: This note was dictated with voice recognition software. Similar sounding words can inadvertently be transcribed and may be missed upon review. Eilleen Kempf, MD 06/28/19

## 2019-06-27 NOTE — Telephone Encounter (Signed)
Called and spoke with patients granddaughter. Confirmed change in time for 3/31 appt

## 2019-06-28 ENCOUNTER — Other Ambulatory Visit: Payer: Self-pay

## 2019-06-28 ENCOUNTER — Inpatient Hospital Stay: Payer: Medicare Other

## 2019-06-28 ENCOUNTER — Inpatient Hospital Stay: Payer: Medicare Other | Admitting: Physician Assistant

## 2019-06-28 ENCOUNTER — Encounter: Payer: Self-pay | Admitting: Physician Assistant

## 2019-06-28 ENCOUNTER — Inpatient Hospital Stay (HOSPITAL_BASED_OUTPATIENT_CLINIC_OR_DEPARTMENT_OTHER): Payer: Medicare Other | Admitting: Physician Assistant

## 2019-06-28 ENCOUNTER — Ambulatory Visit: Payer: Medicare Other

## 2019-06-28 VITALS — BP 116/88 | HR 92 | Temp 98.5°F | Resp 17 | Ht 67.0 in | Wt 148.9 lb

## 2019-06-28 DIAGNOSIS — C3491 Malignant neoplasm of unspecified part of right bronchus or lung: Secondary | ICD-10-CM | POA: Diagnosis not present

## 2019-06-28 DIAGNOSIS — Z5112 Encounter for antineoplastic immunotherapy: Secondary | ICD-10-CM

## 2019-06-28 DIAGNOSIS — Z79899 Other long term (current) drug therapy: Secondary | ICD-10-CM | POA: Diagnosis not present

## 2019-06-28 DIAGNOSIS — Z85820 Personal history of malignant melanoma of skin: Secondary | ICD-10-CM | POA: Diagnosis not present

## 2019-06-28 DIAGNOSIS — I10 Essential (primary) hypertension: Secondary | ICD-10-CM | POA: Diagnosis not present

## 2019-06-28 DIAGNOSIS — R5383 Other fatigue: Secondary | ICD-10-CM | POA: Diagnosis not present

## 2019-06-28 DIAGNOSIS — C3431 Malignant neoplasm of lower lobe, right bronchus or lung: Secondary | ICD-10-CM | POA: Diagnosis not present

## 2019-06-28 DIAGNOSIS — Z5111 Encounter for antineoplastic chemotherapy: Secondary | ICD-10-CM | POA: Diagnosis not present

## 2019-06-28 DIAGNOSIS — E039 Hypothyroidism, unspecified: Secondary | ICD-10-CM | POA: Diagnosis not present

## 2019-06-28 DIAGNOSIS — Z95828 Presence of other vascular implants and grafts: Secondary | ICD-10-CM

## 2019-06-28 DIAGNOSIS — E785 Hyperlipidemia, unspecified: Secondary | ICD-10-CM | POA: Diagnosis not present

## 2019-06-28 DIAGNOSIS — Z7901 Long term (current) use of anticoagulants: Secondary | ICD-10-CM | POA: Diagnosis not present

## 2019-06-28 LAB — CMP (CANCER CENTER ONLY)
ALT: 8 U/L (ref 0–44)
AST: 14 U/L — ABNORMAL LOW (ref 15–41)
Albumin: 3.1 g/dL — ABNORMAL LOW (ref 3.5–5.0)
Alkaline Phosphatase: 84 U/L (ref 38–126)
Anion gap: 11 (ref 5–15)
BUN: 28 mg/dL — ABNORMAL HIGH (ref 8–23)
CO2: 24 mmol/L (ref 22–32)
Calcium: 9.3 mg/dL (ref 8.9–10.3)
Chloride: 108 mmol/L (ref 98–111)
Creatinine: 1.37 mg/dL — ABNORMAL HIGH (ref 0.44–1.00)
GFR, Est AFR Am: 41 mL/min — ABNORMAL LOW (ref >60)
GFR, Estimated: 35 mL/min — ABNORMAL LOW (ref >60)
Glucose, Bld: 106 mg/dL — ABNORMAL HIGH (ref 70–99)
Potassium: 3.9 mmol/L (ref 3.5–5.1)
Sodium: 143 mmol/L (ref 135–145)
Total Bilirubin: 0.3 mg/dL (ref 0.3–1.2)
Total Protein: 6.3 g/dL — ABNORMAL LOW (ref 6.5–8.1)

## 2019-06-28 LAB — CBC WITH DIFFERENTIAL (CANCER CENTER ONLY)
Abs Immature Granulocytes: 0.02 10*3/uL (ref 0.00–0.07)
Basophils Absolute: 0 10*3/uL (ref 0.0–0.1)
Basophils Relative: 0 %
Eosinophils Absolute: 0 10*3/uL (ref 0.0–0.5)
Eosinophils Relative: 1 %
HCT: 28.1 % — ABNORMAL LOW (ref 36.0–46.0)
Hemoglobin: 8.6 g/dL — ABNORMAL LOW (ref 12.0–15.0)
Immature Granulocytes: 0 %
Lymphocytes Relative: 14 %
Lymphs Abs: 0.7 10*3/uL (ref 0.7–4.0)
MCH: 29.8 pg (ref 26.0–34.0)
MCHC: 30.6 g/dL (ref 30.0–36.0)
MCV: 97.2 fL (ref 80.0–100.0)
Monocytes Absolute: 0.4 10*3/uL (ref 0.1–1.0)
Monocytes Relative: 8 %
Neutro Abs: 3.9 10*3/uL (ref 1.7–7.7)
Neutrophils Relative %: 77 %
Platelet Count: 131 10*3/uL — ABNORMAL LOW (ref 150–400)
RBC: 2.89 MIL/uL — ABNORMAL LOW (ref 3.87–5.11)
RDW: 17 % — ABNORMAL HIGH (ref 11.5–15.5)
WBC Count: 5 10*3/uL (ref 4.0–10.5)
nRBC: 0 % (ref 0.0–0.2)

## 2019-06-28 LAB — TSH: TSH: 2.962 u[IU]/mL (ref 0.308–3.960)

## 2019-06-28 MED ORDER — SODIUM CHLORIDE 0.9 % IV SOLN
150.0000 mg | Freq: Once | INTRAVENOUS | Status: AC
  Administered 2019-06-28: 150 mg via INTRAVENOUS
  Filled 2019-06-28: qty 150

## 2019-06-28 MED ORDER — SODIUM CHLORIDE 0.9 % IV SOLN
200.0000 mg | Freq: Once | INTRAVENOUS | Status: AC
  Administered 2019-06-28: 200 mg via INTRAVENOUS
  Filled 2019-06-28: qty 8

## 2019-06-28 MED ORDER — SODIUM CHLORIDE 0.9 % IV SOLN
283.0000 mg | Freq: Once | INTRAVENOUS | Status: AC
  Administered 2019-06-28: 280 mg via INTRAVENOUS
  Filled 2019-06-28: qty 28

## 2019-06-28 MED ORDER — FAMOTIDINE IN NACL 20-0.9 MG/50ML-% IV SOLN
20.0000 mg | Freq: Once | INTRAVENOUS | Status: AC
  Administered 2019-06-28: 20 mg via INTRAVENOUS

## 2019-06-28 MED ORDER — PALONOSETRON HCL INJECTION 0.25 MG/5ML
INTRAVENOUS | Status: AC
  Filled 2019-06-28: qty 5

## 2019-06-28 MED ORDER — SODIUM CHLORIDE 0.9 % IV SOLN
175.0000 mg/m2 | Freq: Once | INTRAVENOUS | Status: AC
  Administered 2019-06-28: 318 mg via INTRAVENOUS
  Filled 2019-06-28: qty 53

## 2019-06-28 MED ORDER — SODIUM CHLORIDE 0.9 % IV SOLN
Freq: Once | INTRAVENOUS | Status: AC
  Filled 2019-06-28: qty 250

## 2019-06-28 MED ORDER — FAMOTIDINE IN NACL 20-0.9 MG/50ML-% IV SOLN
INTRAVENOUS | Status: AC
  Filled 2019-06-28: qty 50

## 2019-06-28 MED ORDER — DIPHENHYDRAMINE HCL 50 MG/ML IJ SOLN
50.0000 mg | Freq: Once | INTRAMUSCULAR | Status: AC
  Administered 2019-06-28: 50 mg via INTRAVENOUS

## 2019-06-28 MED ORDER — DIPHENHYDRAMINE HCL 50 MG/ML IJ SOLN
INTRAMUSCULAR | Status: AC
  Filled 2019-06-28: qty 1

## 2019-06-28 MED ORDER — DEXAMETHASONE SODIUM PHOSPHATE 10 MG/ML IJ SOLN
INTRAMUSCULAR | Status: AC
  Filled 2019-06-28: qty 1

## 2019-06-28 MED ORDER — SODIUM CHLORIDE 0.9% FLUSH
10.0000 mL | INTRAVENOUS | Status: DC | PRN
  Administered 2019-06-28: 10 mL
  Filled 2019-06-28: qty 10

## 2019-06-28 MED ORDER — HEPARIN SOD (PORK) LOCK FLUSH 100 UNIT/ML IV SOLN
500.0000 [IU] | Freq: Once | INTRAVENOUS | Status: AC | PRN
  Administered 2019-06-28: 500 [IU]
  Filled 2019-06-28: qty 5

## 2019-06-28 MED ORDER — PALONOSETRON HCL INJECTION 0.25 MG/5ML
0.2500 mg | Freq: Once | INTRAVENOUS | Status: AC
  Administered 2019-06-28: 0.25 mg via INTRAVENOUS

## 2019-06-28 MED ORDER — SODIUM CHLORIDE 0.9% FLUSH
10.0000 mL | Freq: Once | INTRAVENOUS | Status: AC
  Administered 2019-06-28: 10 mL
  Filled 2019-06-28: qty 10

## 2019-06-28 MED ORDER — DEXAMETHASONE SODIUM PHOSPHATE 10 MG/ML IJ SOLN
10.0000 mg | Freq: Once | INTRAMUSCULAR | Status: AC
  Administered 2019-06-28: 10 mg via INTRAVENOUS

## 2019-06-28 NOTE — Patient Instructions (Signed)
[facility] Discharge Instructions for Patients Receiving Chemotherapy  Today you received the following chemotherapy agents: Keytruda, Taxol, and Carboplatin.   To help prevent nausea and vomiting after your treatment, we encourage you to take your nausea medication as directed.   If you develop nausea and vomiting that is not controlled by your nausea medication, call the clinic.   BELOW ARE SYMPTOMS THAT SHOULD BE REPORTED IMMEDIATELY:  *FEVER GREATER THAN 100.5 F  *CHILLS WITH OR WITHOUT FEVER  NAUSEA AND VOMITING THAT IS NOT CONTROLLED WITH YOUR NAUSEA MEDICATION  *UNUSUAL SHORTNESS OF BREATH  *UNUSUAL BRUISING OR BLEEDING  TENDERNESS IN MOUTH AND THROAT WITH OR WITHOUT PRESENCE OF ULCERS  *URINARY PROBLEMS  *BOWEL PROBLEMS  UNUSUAL RASH Items with * indicate a potential emergency and should be followed up as soon as possible.  Feel free to call the clinic should you have any questions or concerns. The clinic phone number is [phone].  Please show the [facility] at check-in to the Emergency Department and triage nurse.

## 2019-06-30 ENCOUNTER — Encounter: Payer: Self-pay | Admitting: Internal Medicine

## 2019-06-30 ENCOUNTER — Other Ambulatory Visit: Payer: Self-pay

## 2019-06-30 ENCOUNTER — Inpatient Hospital Stay: Payer: Medicare Other | Attending: Internal Medicine

## 2019-06-30 ENCOUNTER — Inpatient Hospital Stay: Payer: Medicare Other

## 2019-06-30 VITALS — BP 120/69 | HR 85 | Temp 98.5°F | Resp 18

## 2019-06-30 DIAGNOSIS — Z7901 Long term (current) use of anticoagulants: Secondary | ICD-10-CM | POA: Diagnosis not present

## 2019-06-30 DIAGNOSIS — C3431 Malignant neoplasm of lower lobe, right bronchus or lung: Secondary | ICD-10-CM | POA: Diagnosis not present

## 2019-06-30 DIAGNOSIS — Z85820 Personal history of malignant melanoma of skin: Secondary | ICD-10-CM | POA: Insufficient documentation

## 2019-06-30 DIAGNOSIS — Z5112 Encounter for antineoplastic immunotherapy: Secondary | ICD-10-CM | POA: Insufficient documentation

## 2019-06-30 DIAGNOSIS — R5383 Other fatigue: Secondary | ICD-10-CM | POA: Insufficient documentation

## 2019-06-30 DIAGNOSIS — E785 Hyperlipidemia, unspecified: Secondary | ICD-10-CM | POA: Diagnosis not present

## 2019-06-30 DIAGNOSIS — Z923 Personal history of irradiation: Secondary | ICD-10-CM | POA: Insufficient documentation

## 2019-06-30 DIAGNOSIS — C3491 Malignant neoplasm of unspecified part of right bronchus or lung: Secondary | ICD-10-CM

## 2019-06-30 DIAGNOSIS — Z79899 Other long term (current) drug therapy: Secondary | ICD-10-CM | POA: Insufficient documentation

## 2019-06-30 DIAGNOSIS — Z9221 Personal history of antineoplastic chemotherapy: Secondary | ICD-10-CM | POA: Diagnosis not present

## 2019-06-30 DIAGNOSIS — I10 Essential (primary) hypertension: Secondary | ICD-10-CM | POA: Diagnosis not present

## 2019-06-30 DIAGNOSIS — E039 Hypothyroidism, unspecified: Secondary | ICD-10-CM | POA: Diagnosis not present

## 2019-06-30 MED ORDER — PEGFILGRASTIM-JMDB 6 MG/0.6ML ~~LOC~~ SOSY
PREFILLED_SYRINGE | SUBCUTANEOUS | Status: AC
  Filled 2019-06-30: qty 0.6

## 2019-06-30 MED ORDER — PEGFILGRASTIM-JMDB 6 MG/0.6ML ~~LOC~~ SOSY
6.0000 mg | PREFILLED_SYRINGE | Freq: Once | SUBCUTANEOUS | Status: AC
  Administered 2019-06-30: 6 mg via SUBCUTANEOUS

## 2019-07-01 ENCOUNTER — Ambulatory Visit: Payer: Medicare Other

## 2019-07-03 ENCOUNTER — Ambulatory Visit (HOSPITAL_COMMUNITY): Payer: Medicare Other

## 2019-07-05 ENCOUNTER — Telehealth: Payer: Self-pay | Admitting: Medical Oncology

## 2019-07-05 ENCOUNTER — Inpatient Hospital Stay: Payer: Medicare Other

## 2019-07-05 ENCOUNTER — Other Ambulatory Visit: Payer: Self-pay

## 2019-07-05 ENCOUNTER — Telehealth: Payer: Self-pay | Admitting: *Deleted

## 2019-07-05 ENCOUNTER — Other Ambulatory Visit: Payer: Self-pay | Admitting: Physician Assistant

## 2019-07-05 ENCOUNTER — Other Ambulatory Visit: Payer: Self-pay | Admitting: *Deleted

## 2019-07-05 DIAGNOSIS — R519 Headache, unspecified: Secondary | ICD-10-CM

## 2019-07-05 DIAGNOSIS — Z86718 Personal history of other venous thrombosis and embolism: Secondary | ICD-10-CM

## 2019-07-05 DIAGNOSIS — E039 Hypothyroidism, unspecified: Secondary | ICD-10-CM

## 2019-07-05 DIAGNOSIS — K5790 Diverticulosis of intestine, part unspecified, without perforation or abscess without bleeding: Secondary | ICD-10-CM

## 2019-07-05 DIAGNOSIS — I129 Hypertensive chronic kidney disease with stage 1 through stage 4 chronic kidney disease, or unspecified chronic kidney disease: Secondary | ICD-10-CM | POA: Diagnosis not present

## 2019-07-05 DIAGNOSIS — D630 Anemia in neoplastic disease: Secondary | ICD-10-CM | POA: Diagnosis not present

## 2019-07-05 DIAGNOSIS — C3491 Malignant neoplasm of unspecified part of right bronchus or lung: Secondary | ICD-10-CM

## 2019-07-05 DIAGNOSIS — T451X5A Adverse effect of antineoplastic and immunosuppressive drugs, initial encounter: Secondary | ICD-10-CM

## 2019-07-05 DIAGNOSIS — Z95828 Presence of other vascular implants and grafts: Secondary | ICD-10-CM

## 2019-07-05 DIAGNOSIS — D126 Benign neoplasm of colon, unspecified: Secondary | ICD-10-CM

## 2019-07-05 DIAGNOSIS — D6481 Anemia due to antineoplastic chemotherapy: Secondary | ICD-10-CM

## 2019-07-05 DIAGNOSIS — Z85828 Personal history of other malignant neoplasm of skin: Secondary | ICD-10-CM

## 2019-07-05 DIAGNOSIS — D696 Thrombocytopenia, unspecified: Secondary | ICD-10-CM

## 2019-07-05 DIAGNOSIS — W010XXD Fall on same level from slipping, tripping and stumbling without subsequent striking against object, subsequent encounter: Secondary | ICD-10-CM

## 2019-07-05 DIAGNOSIS — N183 Chronic kidney disease, stage 3 unspecified: Secondary | ICD-10-CM

## 2019-07-05 DIAGNOSIS — C342 Malignant neoplasm of middle lobe, bronchus or lung: Secondary | ICD-10-CM | POA: Diagnosis not present

## 2019-07-05 DIAGNOSIS — Z96642 Presence of left artificial hip joint: Secondary | ICD-10-CM

## 2019-07-05 DIAGNOSIS — Z87891 Personal history of nicotine dependence: Secondary | ICD-10-CM

## 2019-07-05 DIAGNOSIS — S72032D Displaced midcervical fracture of left femur, subsequent encounter for closed fracture with routine healing: Secondary | ICD-10-CM | POA: Diagnosis not present

## 2019-07-05 DIAGNOSIS — C3431 Malignant neoplasm of lower lobe, right bronchus or lung: Secondary | ICD-10-CM | POA: Diagnosis not present

## 2019-07-05 DIAGNOSIS — E785 Hyperlipidemia, unspecified: Secondary | ICD-10-CM

## 2019-07-05 DIAGNOSIS — Z9181 History of falling: Secondary | ICD-10-CM

## 2019-07-05 DIAGNOSIS — D631 Anemia in chronic kidney disease: Secondary | ICD-10-CM

## 2019-07-05 DIAGNOSIS — Z7901 Long term (current) use of anticoagulants: Secondary | ICD-10-CM

## 2019-07-05 LAB — CBC WITH DIFFERENTIAL (CANCER CENTER ONLY)
Abs Immature Granulocytes: 0 10*3/uL (ref 0.00–0.07)
Basophils Absolute: 0 10*3/uL (ref 0.0–0.1)
Basophils Relative: 1 %
Eosinophils Absolute: 0.1 10*3/uL (ref 0.0–0.5)
Eosinophils Relative: 6 %
HCT: 24.8 % — ABNORMAL LOW (ref 36.0–46.0)
Hemoglobin: 7.7 g/dL — ABNORMAL LOW (ref 12.0–15.0)
Immature Granulocytes: 0 %
Lymphocytes Relative: 80 %
Lymphs Abs: 0.8 10*3/uL (ref 0.7–4.0)
MCH: 29.8 pg (ref 26.0–34.0)
MCHC: 31 g/dL (ref 30.0–36.0)
MCV: 96.1 fL (ref 80.0–100.0)
Monocytes Absolute: 0.1 10*3/uL (ref 0.1–1.0)
Monocytes Relative: 10 %
Neutro Abs: 0 10*3/uL — CL (ref 1.7–7.7)
Neutrophils Relative %: 3 %
Platelet Count: 25 10*3/uL — ABNORMAL LOW (ref 150–400)
RBC: 2.58 MIL/uL — ABNORMAL LOW (ref 3.87–5.11)
RDW: 16 % — ABNORMAL HIGH (ref 11.5–15.5)
WBC Count: 1 10*3/uL — ABNORMAL LOW (ref 4.0–10.5)
nRBC: 0 % (ref 0.0–0.2)

## 2019-07-05 LAB — CMP (CANCER CENTER ONLY)
ALT: 28 U/L (ref 0–44)
AST: 24 U/L (ref 15–41)
Albumin: 2.9 g/dL — ABNORMAL LOW (ref 3.5–5.0)
Alkaline Phosphatase: 76 U/L (ref 38–126)
Anion gap: 6 (ref 5–15)
BUN: 30 mg/dL — ABNORMAL HIGH (ref 8–23)
CO2: 25 mmol/L (ref 22–32)
Calcium: 8.9 mg/dL (ref 8.9–10.3)
Chloride: 109 mmol/L (ref 98–111)
Creatinine: 1.45 mg/dL — ABNORMAL HIGH (ref 0.44–1.00)
GFR, Est AFR Am: 38 mL/min — ABNORMAL LOW (ref >60)
GFR, Estimated: 33 mL/min — ABNORMAL LOW (ref >60)
Glucose, Bld: 113 mg/dL — ABNORMAL HIGH (ref 70–99)
Potassium: 4.6 mmol/L (ref 3.5–5.1)
Sodium: 140 mmol/L (ref 135–145)
Total Bilirubin: 0.4 mg/dL (ref 0.3–1.2)
Total Protein: 5.5 g/dL — ABNORMAL LOW (ref 6.5–8.1)

## 2019-07-05 LAB — SAMPLE TO BLOOD BANK

## 2019-07-05 LAB — TSH: TSH: 7.643 u[IU]/mL — ABNORMAL HIGH (ref 0.308–3.960)

## 2019-07-05 MED ORDER — HEPARIN SOD (PORK) LOCK FLUSH 100 UNIT/ML IV SOLN
500.0000 [IU] | Freq: Once | INTRAVENOUS | Status: AC
  Administered 2019-07-05: 500 [IU]
  Filled 2019-07-05: qty 5

## 2019-07-05 MED ORDER — SODIUM CHLORIDE 0.9% FLUSH
10.0000 mL | Freq: Once | INTRAVENOUS | Status: AC
  Administered 2019-07-05: 10 mL
  Filled 2019-07-05: qty 10

## 2019-07-05 NOTE — Telephone Encounter (Signed)
CRITICAL VALUE STICKER  CRITICAL VALUE: ANC =0.01  RECEIVER (on-site recipient of call):Diane Bell  DATE & TIME NOTIFIED: 07/05/19 1309  MESSENGER (representative from lab):Jay  MD NOTIFIED: MOhamed TIME OF NOTIFICATION:1300 07/05/19 RESPONSE: pt received neulasta . I reviewed neutropenic precaution with granddtr and when to call.

## 2019-07-05 NOTE — Telephone Encounter (Signed)
Spoke with patient, scheduled visit for 2 units blood this Friday.  Patient aware of appts.  Also educated patient on neutropenic precautions.  Stressed importance of being extra cautious.

## 2019-07-05 NOTE — Patient Instructions (Signed)

## 2019-07-06 ENCOUNTER — Other Ambulatory Visit: Payer: Self-pay | Admitting: Medical Oncology

## 2019-07-06 DIAGNOSIS — D6481 Anemia due to antineoplastic chemotherapy: Secondary | ICD-10-CM

## 2019-07-06 DIAGNOSIS — C3431 Malignant neoplasm of lower lobe, right bronchus or lung: Secondary | ICD-10-CM | POA: Diagnosis not present

## 2019-07-06 DIAGNOSIS — T451X5A Adverse effect of antineoplastic and immunosuppressive drugs, initial encounter: Secondary | ICD-10-CM

## 2019-07-06 LAB — PREPARE RBC (CROSSMATCH)

## 2019-07-07 ENCOUNTER — Other Ambulatory Visit: Payer: Self-pay

## 2019-07-07 ENCOUNTER — Inpatient Hospital Stay: Payer: Medicare Other

## 2019-07-07 VITALS — BP 112/67 | HR 77 | Temp 97.2°F | Resp 16

## 2019-07-07 DIAGNOSIS — Z923 Personal history of irradiation: Secondary | ICD-10-CM | POA: Diagnosis not present

## 2019-07-07 DIAGNOSIS — C3491 Malignant neoplasm of unspecified part of right bronchus or lung: Secondary | ICD-10-CM

## 2019-07-07 DIAGNOSIS — E039 Hypothyroidism, unspecified: Secondary | ICD-10-CM | POA: Diagnosis not present

## 2019-07-07 DIAGNOSIS — I10 Essential (primary) hypertension: Secondary | ICD-10-CM | POA: Diagnosis not present

## 2019-07-07 DIAGNOSIS — T451X5A Adverse effect of antineoplastic and immunosuppressive drugs, initial encounter: Secondary | ICD-10-CM

## 2019-07-07 DIAGNOSIS — Z7901 Long term (current) use of anticoagulants: Secondary | ICD-10-CM | POA: Diagnosis not present

## 2019-07-07 DIAGNOSIS — Z79899 Other long term (current) drug therapy: Secondary | ICD-10-CM | POA: Diagnosis not present

## 2019-07-07 DIAGNOSIS — Z85820 Personal history of malignant melanoma of skin: Secondary | ICD-10-CM | POA: Diagnosis not present

## 2019-07-07 DIAGNOSIS — D6481 Anemia due to antineoplastic chemotherapy: Secondary | ICD-10-CM

## 2019-07-07 DIAGNOSIS — Z95828 Presence of other vascular implants and grafts: Secondary | ICD-10-CM

## 2019-07-07 DIAGNOSIS — Z5112 Encounter for antineoplastic immunotherapy: Secondary | ICD-10-CM | POA: Diagnosis not present

## 2019-07-07 DIAGNOSIS — E785 Hyperlipidemia, unspecified: Secondary | ICD-10-CM | POA: Diagnosis not present

## 2019-07-07 DIAGNOSIS — Z9221 Personal history of antineoplastic chemotherapy: Secondary | ICD-10-CM | POA: Diagnosis not present

## 2019-07-07 DIAGNOSIS — R5383 Other fatigue: Secondary | ICD-10-CM | POA: Diagnosis not present

## 2019-07-07 DIAGNOSIS — C3431 Malignant neoplasm of lower lobe, right bronchus or lung: Secondary | ICD-10-CM | POA: Diagnosis not present

## 2019-07-07 MED ORDER — ACETAMINOPHEN 325 MG PO TABS
650.0000 mg | ORAL_TABLET | Freq: Once | ORAL | Status: AC
  Administered 2019-07-07: 650 mg via ORAL

## 2019-07-07 MED ORDER — ACETAMINOPHEN 325 MG PO TABS
ORAL_TABLET | ORAL | Status: AC
  Filled 2019-07-07: qty 2

## 2019-07-07 MED ORDER — SODIUM CHLORIDE 0.9% IV SOLUTION
250.0000 mL | Freq: Once | INTRAVENOUS | Status: AC
  Administered 2019-07-07: 250 mL via INTRAVENOUS
  Filled 2019-07-07: qty 250

## 2019-07-07 MED ORDER — DIPHENHYDRAMINE HCL 25 MG PO CAPS
ORAL_CAPSULE | ORAL | Status: AC
  Filled 2019-07-07: qty 1

## 2019-07-07 MED ORDER — DIPHENHYDRAMINE HCL 25 MG PO CAPS
25.0000 mg | ORAL_CAPSULE | Freq: Once | ORAL | Status: AC
  Administered 2019-07-07: 25 mg via ORAL

## 2019-07-07 MED ORDER — SODIUM CHLORIDE 0.9% FLUSH
10.0000 mL | Freq: Once | INTRAVENOUS | Status: AC
  Administered 2019-07-07: 10 mL
  Filled 2019-07-07: qty 10

## 2019-07-07 MED ORDER — HEPARIN SOD (PORK) LOCK FLUSH 100 UNIT/ML IV SOLN
500.0000 [IU] | Freq: Once | INTRAVENOUS | Status: AC
  Administered 2019-07-07: 500 [IU]
  Filled 2019-07-07: qty 5

## 2019-07-07 NOTE — Patient Instructions (Signed)

## 2019-07-08 LAB — TYPE AND SCREEN
ABO/RH(D): O NEG
Antibody Screen: NEGATIVE
Unit division: 0
Unit division: 0

## 2019-07-08 LAB — BPAM RBC
Blood Product Expiration Date: 202105102359
Blood Product Expiration Date: 202105122359
ISSUE DATE / TIME: 202104090848
ISSUE DATE / TIME: 202104090848
Unit Type and Rh: 9500
Unit Type and Rh: 9500

## 2019-07-11 ENCOUNTER — Other Ambulatory Visit: Payer: Self-pay | Admitting: Physician Assistant

## 2019-07-11 DIAGNOSIS — R630 Anorexia: Secondary | ICD-10-CM

## 2019-07-11 DIAGNOSIS — C3491 Malignant neoplasm of unspecified part of right bronchus or lung: Secondary | ICD-10-CM

## 2019-07-11 DIAGNOSIS — I825Z3 Chronic embolism and thrombosis of unspecified deep veins of distal lower extremity, bilateral: Secondary | ICD-10-CM

## 2019-07-11 IMAGING — CR DG CHEST 2V
2 series · 2 of 2 positions shown · non-contrast
Comparison: , CT chest of 10/26/2017 and PET-CT of 11/08/2017

CLINICAL DATA: Preop

EXAM:
CHEST - 2 VIEW

[w chest lat]
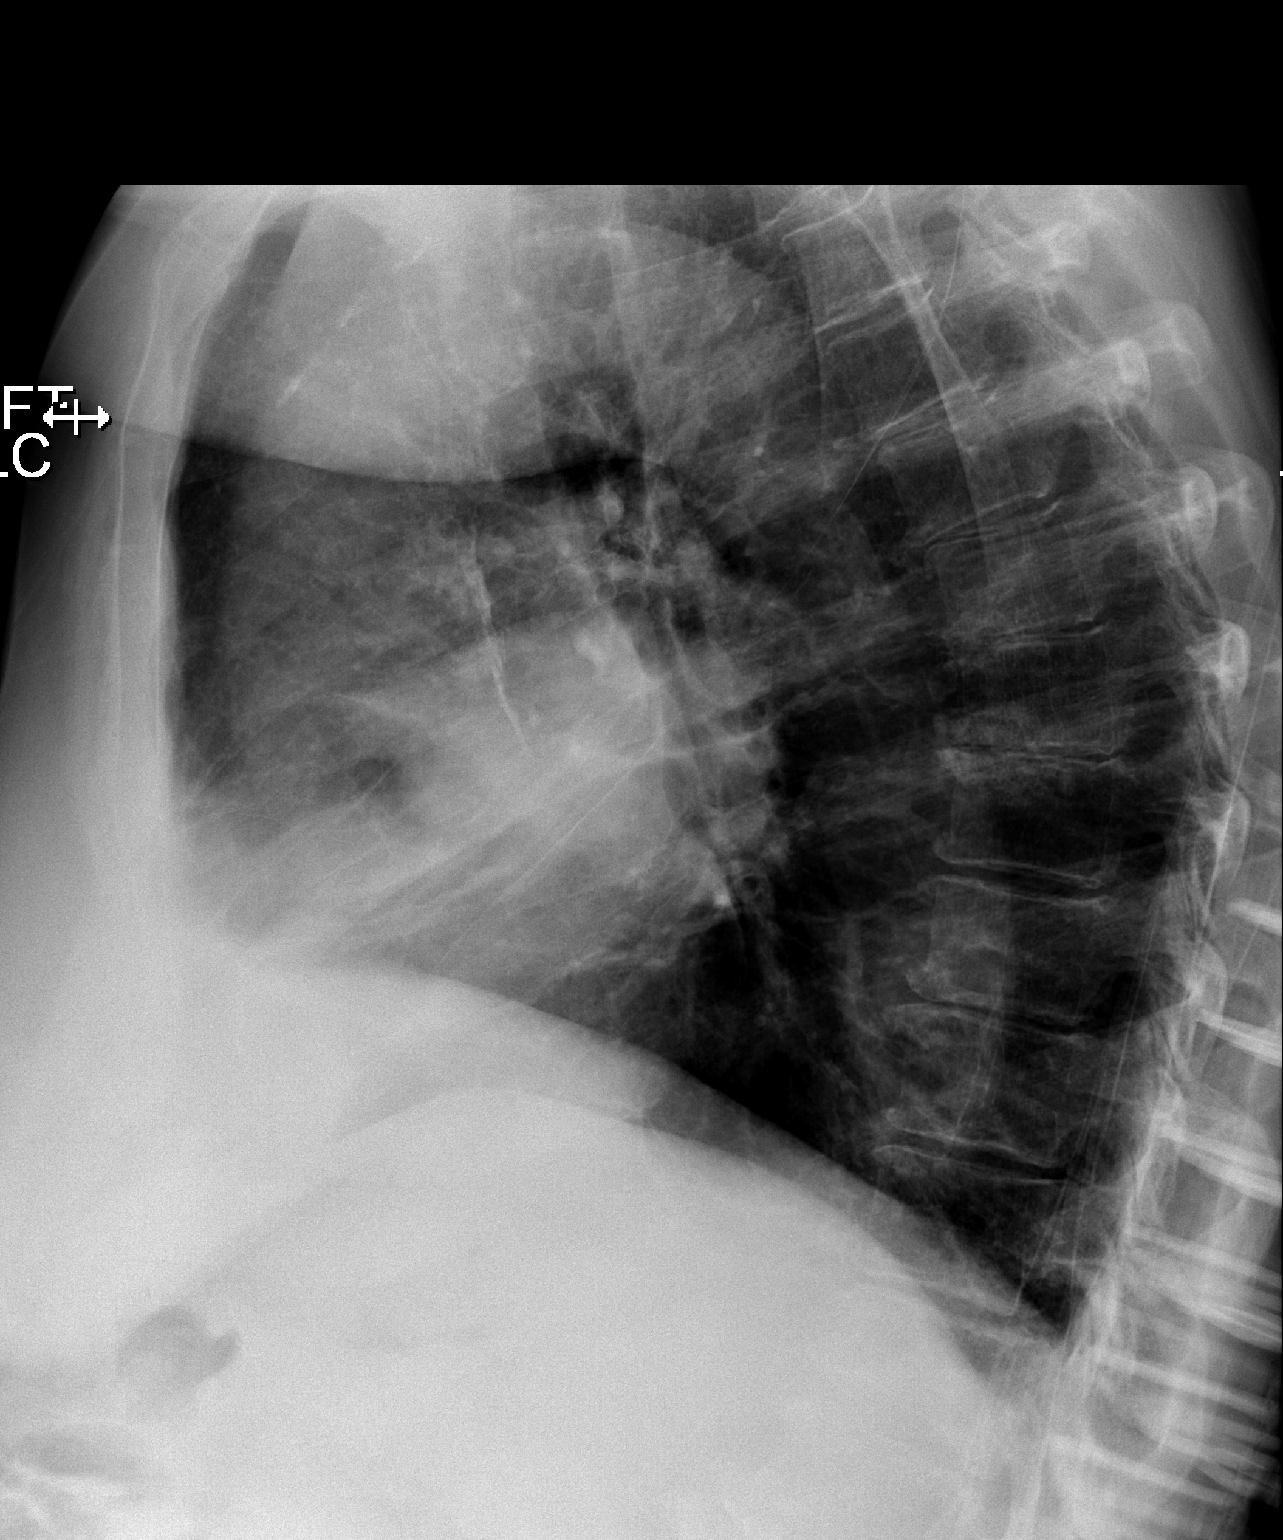

[x chest ap]
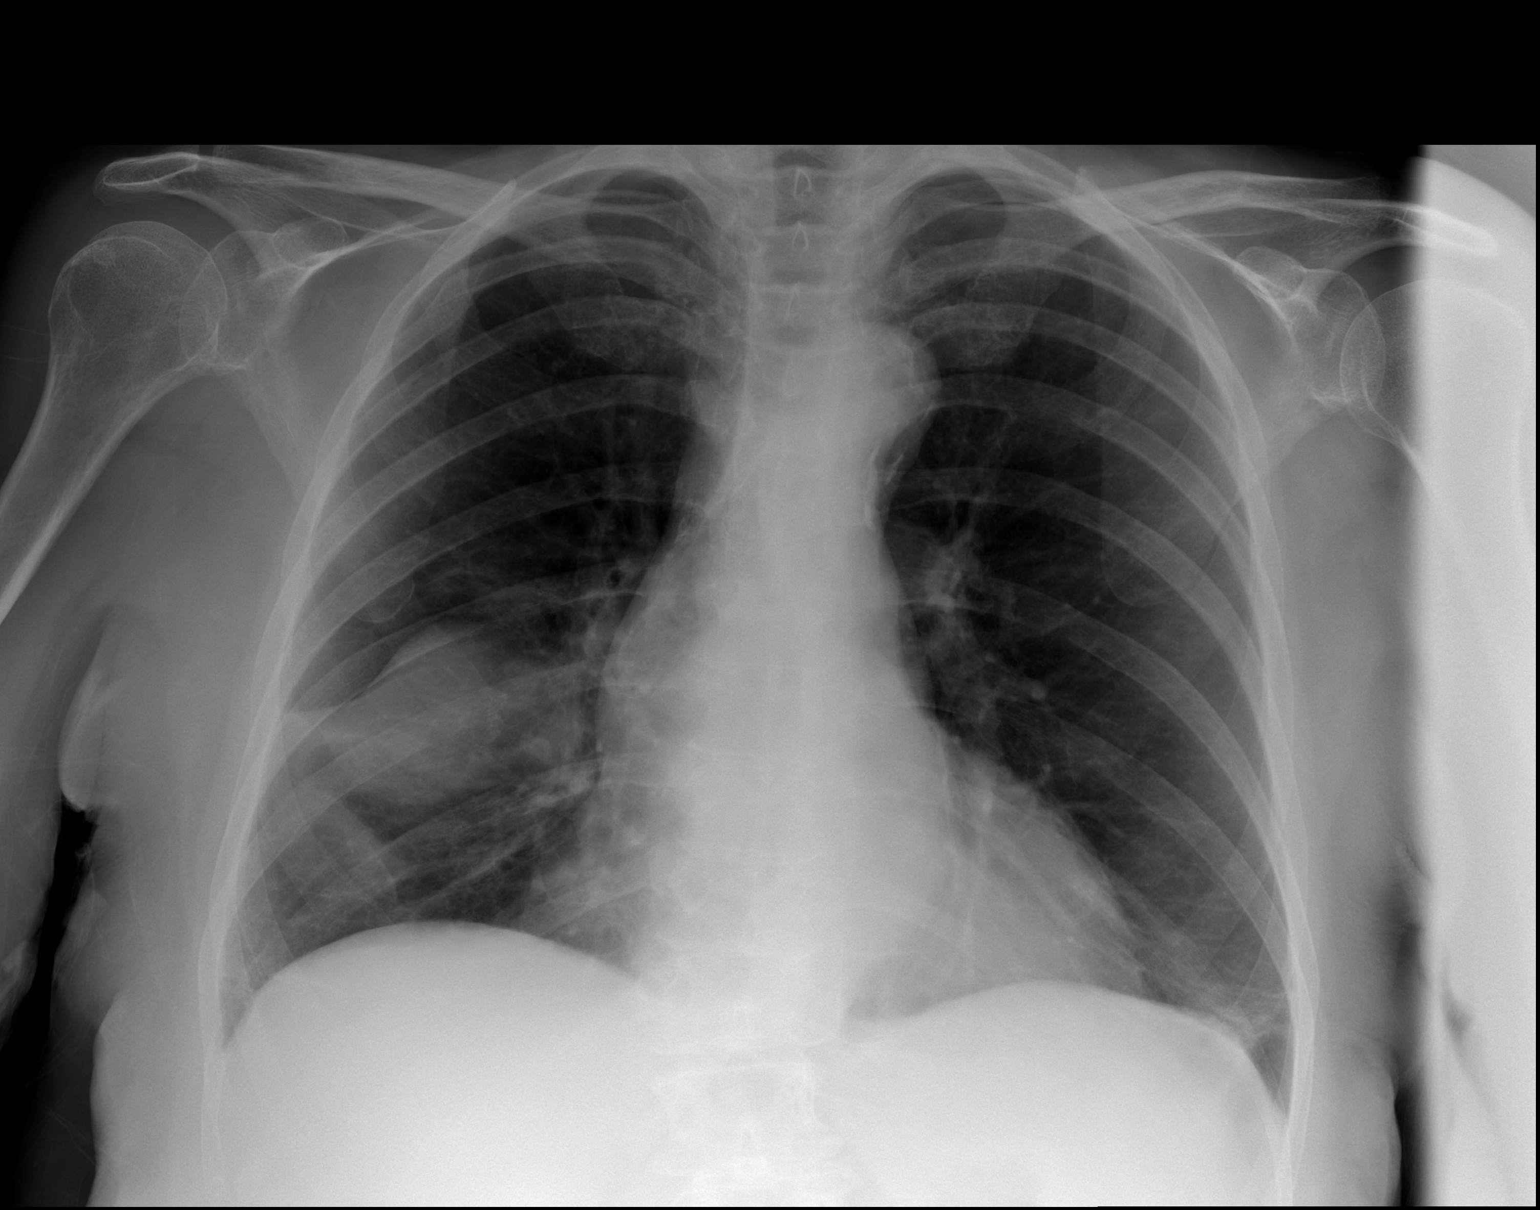

[2 of 2 positions shown; findings below may reference images not displayed]

FINDINGS: There is a moderately large rounded mass within the right middle
lobe proximally consistent with primary lung carcinoma when compared
with prior CT and PET-CT studies. However the remainder of the lungs
are clear with only mild linear atelectasis or scarring at the left
lung base. Mediastinal hilar contours are unremarkable and the heart
is within upper limits of normal. Moderate thoracic aortic
atherosclerosis is present. No acute bony abnormality is seen.
IMPRESSION: Large right middle lobe mass consistent with primary lung carcinoma
as noted above. No other abnormality is seen.

## 2019-07-12 ENCOUNTER — Inpatient Hospital Stay: Payer: Medicare Other

## 2019-07-13 DIAGNOSIS — R2681 Unsteadiness on feet: Secondary | ICD-10-CM | POA: Diagnosis not present

## 2019-07-13 DIAGNOSIS — S72002D Fracture of unspecified part of neck of left femur, subsequent encounter for closed fracture with routine healing: Secondary | ICD-10-CM | POA: Diagnosis not present

## 2019-07-13 DIAGNOSIS — M6281 Muscle weakness (generalized): Secondary | ICD-10-CM | POA: Diagnosis not present

## 2019-07-13 DIAGNOSIS — I82409 Acute embolism and thrombosis of unspecified deep veins of unspecified lower extremity: Secondary | ICD-10-CM | POA: Diagnosis not present

## 2019-07-13 NOTE — Progress Notes (Signed)
Pharmacist Chemotherapy Monitoring - Follow Up Assessment    I verify that I have reviewed each item in the below checklist:  . Regimen for the patient is scheduled for the appropriate day and plan matches scheduled date. Marland Kitchen Appropriate non-routine labs are ordered dependent on drug ordered. . If applicable, additional medications reviewed and ordered per protocol based on lifetime cumulative doses and/or treatment regimen.   Plan for follow-up and/or issues identified: No . I-vent associated with next due treatment: No . MD and/or nursing notified: No   Kennith Center, Pharm.D., CPP 07/13/2019@12 :43 PM

## 2019-07-13 NOTE — Progress Notes (Signed)
Pharmacist Chemotherapy Monitoring - Follow Up Assessment    I verify that I have reviewed each item in the below checklist:  . Regimen for the patient is scheduled for the appropriate day and plan matches scheduled date. Marland Kitchen Appropriate non-routine labs are ordered dependent on drug ordered. . If applicable, additional medications reviewed and ordered per protocol based on lifetime cumulative doses and/or treatment regimen.   Plan for follow-up and/or issues identified: Yes . I-vent associated with next due treatment: Yes . MD and/or nursing notified: Yes   Kennith Center, Pharm.D., CPP 07/13/2019@12 :44 PM

## 2019-07-14 ENCOUNTER — Inpatient Hospital Stay: Payer: Medicare Other

## 2019-07-14 ENCOUNTER — Other Ambulatory Visit: Payer: Self-pay

## 2019-07-14 DIAGNOSIS — C3431 Malignant neoplasm of lower lobe, right bronchus or lung: Secondary | ICD-10-CM | POA: Diagnosis not present

## 2019-07-14 DIAGNOSIS — D630 Anemia in neoplastic disease: Secondary | ICD-10-CM | POA: Diagnosis not present

## 2019-07-14 DIAGNOSIS — R519 Headache, unspecified: Secondary | ICD-10-CM | POA: Diagnosis not present

## 2019-07-14 DIAGNOSIS — S72032D Displaced midcervical fracture of left femur, subsequent encounter for closed fracture with routine healing: Secondary | ICD-10-CM | POA: Diagnosis not present

## 2019-07-14 DIAGNOSIS — Z96642 Presence of left artificial hip joint: Secondary | ICD-10-CM | POA: Diagnosis not present

## 2019-07-14 DIAGNOSIS — E785 Hyperlipidemia, unspecified: Secondary | ICD-10-CM | POA: Diagnosis not present

## 2019-07-14 DIAGNOSIS — C342 Malignant neoplasm of middle lobe, bronchus or lung: Secondary | ICD-10-CM | POA: Diagnosis not present

## 2019-07-14 DIAGNOSIS — Z95828 Presence of other vascular implants and grafts: Secondary | ICD-10-CM

## 2019-07-14 DIAGNOSIS — D631 Anemia in chronic kidney disease: Secondary | ICD-10-CM | POA: Diagnosis not present

## 2019-07-14 DIAGNOSIS — C3491 Malignant neoplasm of unspecified part of right bronchus or lung: Secondary | ICD-10-CM

## 2019-07-14 DIAGNOSIS — D126 Benign neoplasm of colon, unspecified: Secondary | ICD-10-CM | POA: Diagnosis not present

## 2019-07-14 DIAGNOSIS — Z7901 Long term (current) use of anticoagulants: Secondary | ICD-10-CM | POA: Diagnosis not present

## 2019-07-14 DIAGNOSIS — K5790 Diverticulosis of intestine, part unspecified, without perforation or abscess without bleeding: Secondary | ICD-10-CM | POA: Diagnosis not present

## 2019-07-14 DIAGNOSIS — I129 Hypertensive chronic kidney disease with stage 1 through stage 4 chronic kidney disease, or unspecified chronic kidney disease: Secondary | ICD-10-CM | POA: Diagnosis not present

## 2019-07-14 DIAGNOSIS — W010XXD Fall on same level from slipping, tripping and stumbling without subsequent striking against object, subsequent encounter: Secondary | ICD-10-CM | POA: Diagnosis not present

## 2019-07-14 DIAGNOSIS — Z9181 History of falling: Secondary | ICD-10-CM | POA: Diagnosis not present

## 2019-07-14 DIAGNOSIS — D696 Thrombocytopenia, unspecified: Secondary | ICD-10-CM | POA: Diagnosis not present

## 2019-07-14 DIAGNOSIS — N183 Chronic kidney disease, stage 3 unspecified: Secondary | ICD-10-CM | POA: Diagnosis not present

## 2019-07-14 DIAGNOSIS — E039 Hypothyroidism, unspecified: Secondary | ICD-10-CM | POA: Diagnosis not present

## 2019-07-14 LAB — CBC WITH DIFFERENTIAL (CANCER CENTER ONLY)
Abs Immature Granulocytes: 0.1 10*3/uL — ABNORMAL HIGH (ref 0.00–0.07)
Basophils Absolute: 0 10*3/uL (ref 0.0–0.1)
Basophils Relative: 0 %
Eosinophils Absolute: 0 10*3/uL (ref 0.0–0.5)
Eosinophils Relative: 0 %
HCT: 31 % — ABNORMAL LOW (ref 36.0–46.0)
Hemoglobin: 9.6 g/dL — ABNORMAL LOW (ref 12.0–15.0)
Immature Granulocytes: 2 %
Lymphocytes Relative: 20 %
Lymphs Abs: 1.2 10*3/uL (ref 0.7–4.0)
MCH: 30.2 pg (ref 26.0–34.0)
MCHC: 31 g/dL (ref 30.0–36.0)
MCV: 97.5 fL (ref 80.0–100.0)
Monocytes Absolute: 0.6 10*3/uL (ref 0.1–1.0)
Monocytes Relative: 9 %
Neutro Abs: 4.1 10*3/uL (ref 1.7–7.7)
Neutrophils Relative %: 69 %
Platelet Count: 45 10*3/uL — ABNORMAL LOW (ref 150–400)
RBC: 3.18 MIL/uL — ABNORMAL LOW (ref 3.87–5.11)
RDW: 16.1 % — ABNORMAL HIGH (ref 11.5–15.5)
WBC Count: 6 10*3/uL (ref 4.0–10.5)
nRBC: 0 % (ref 0.0–0.2)

## 2019-07-14 LAB — CMP (CANCER CENTER ONLY)
ALT: 10 U/L (ref 0–44)
AST: 14 U/L — ABNORMAL LOW (ref 15–41)
Albumin: 3 g/dL — ABNORMAL LOW (ref 3.5–5.0)
Alkaline Phosphatase: 108 U/L (ref 38–126)
Anion gap: 10 (ref 5–15)
BUN: 17 mg/dL (ref 8–23)
CO2: 24 mmol/L (ref 22–32)
Calcium: 8.9 mg/dL (ref 8.9–10.3)
Chloride: 108 mmol/L (ref 98–111)
Creatinine: 1.11 mg/dL — ABNORMAL HIGH (ref 0.44–1.00)
GFR, Est AFR Am: 53 mL/min — ABNORMAL LOW (ref >60)
GFR, Estimated: 46 mL/min — ABNORMAL LOW (ref >60)
Glucose, Bld: 94 mg/dL (ref 70–99)
Potassium: 4 mmol/L (ref 3.5–5.1)
Sodium: 142 mmol/L (ref 135–145)
Total Bilirubin: 0.4 mg/dL (ref 0.3–1.2)
Total Protein: 6.2 g/dL — ABNORMAL LOW (ref 6.5–8.1)

## 2019-07-14 MED ORDER — HEPARIN SOD (PORK) LOCK FLUSH 100 UNIT/ML IV SOLN
500.0000 [IU] | Freq: Once | INTRAVENOUS | Status: AC
  Administered 2019-07-14: 500 [IU]
  Filled 2019-07-14: qty 5

## 2019-07-14 MED ORDER — SODIUM CHLORIDE 0.9% FLUSH
10.0000 mL | Freq: Once | INTRAVENOUS | Status: AC
  Administered 2019-07-14: 11:00:00 10 mL
  Filled 2019-07-14: qty 10

## 2019-07-16 NOTE — Progress Notes (Signed)
[facility] OFFICE PROGRESS NOTE  [name], MD [name] [address]  DIAGNOSIS: Recurrent non-small cell lung cancer initially diagnosed stage IIIB (T3, N3, M0) non-small cell lung cancer, squamous cell carcinoma presented with large right middle lobe lung mass in addition to right hilar, subcarinal and right supraclavicular lymphadenopathy diagnosed in August 2019.The patient had evidence for disease progression in December 2020.  PRIOR THERAPY:  1) Concurrent chemoradiation with weekly carboplatin for AUC of 2 and paclitaxel 45 NG/M2. Status post 6 cycles with partial response. 2) Consolidation immunotherapy with Imfinzi 10 mg/KG every 2 weeks started February 28, 2018. Status post 26 cycles. Last cycle was given on February 21, 2019  CURRENT THERAPY: Systemic chemotherapy with carboplatin for an AUC of 5, paclitaxel 175 mg/m and Keytruda 200 mg IV every 3 weeks with Neulasta support. First dose on April 05, 2018. Status post 4 cycles. Starting from cycle #5 she will be on single agent maintenance Keytruda.   INTERVAL HISTORY: Lisa [name] 86 y.o. female returns to the clinic for a follow up visit. The patient is feeling well today without any concerning complaints.She required 2 units of RBCs after her last cycle of treatment. She also was neutropenic. Today, she denies any fever, chills, night sweats, or weight loss.  She denies any recent signs and symptoms of infection including sore throat, nasal congestion, skin infections, or dysuria.  She denies any abnormal bleeding or bruising including melena, epistaxis, gingival bleeding, hemoptysis, hematemesis, or hematuria. She denies any chest pain, shortness of breath, cough, or hemoptysis.  She denies any nausea, vomiting, diarrhea, or constipation.  She denies any headache or visual changes.  She denies any rashes or skin changes. She is here today for evaluation before starting cycle #5.    MEDICAL HISTORY: Past Medical History:  Diagnosis Date  . Diverticulosis   . Headache   . Hemorrhoids    internal and external  . Hyperlipidemia 01/29/1991  . Hypertension 10/29/1995  . Hypothyroidism 10/29/1995  . Lung mass    right middle lobe  . Melanoma ([place])    h/o, local excision. no chemo or rady.   . Skin cancer (melanoma) ([place])   . Tubular adenoma of colon   . Wears dentures     ALLERGIES:  is allergic to atorvastatin; celebrex [celecoxib]; cholecalciferol; simvastatin; and tape.  MEDICATIONS:  Current Outpatient Medications  Medication Sig Dispense Refill  . albuterol (VENTOLIN HFA) 108 (90 Base) MCG/ACT inhaler Inhale 2 puffs into the lungs every 6 (six) hours as needed for wheezing or shortness of breath. 6.7 g 0  . amLODipine (NORVASC) 10 MG tablet Take 1 tablet by mouth once daily 90 tablet 2  . Cholecalciferol (VITAMIN D3) 50 MCG (2000 UT) TABS Take 2,000 Units by mouth 3 (three) times a week.    . Coenzyme Q10 100 MG capsule Take 100 mg by mouth daily.     Marland Kitchen docusate sodium (COLACE) 100 MG capsule Take 100 mg by mouth daily as needed for mild constipation.    . fluticasone (FLONASE) 50 MCG/ACT nasal spray USE 2 SPRAYS IN EACH NOSTRIL DAILY AS NEEDED FOR ALLERGIES 48 g 3  . levothyroxine (SYNTHROID, LEVOTHROID) 75 MCG tablet Take 1 tablet by mouth once daily 90 tablet 3  . loratadine (CLARITIN) 10 MG tablet Take 1 tablet (10 mg total) by mouth daily as needed for allergies or rhinitis.    . mirtazapine (REMERON) 7.5 MG tablet TAKE 1 TABLET BY  MOUTH EVERY DAY AT BEDTIME 30 tablet 2  . Multiple Vitamin (MULTIVITAMIN WITH MINERALS) TABS tablet Take 1 tablet by mouth daily.    . sennosides-docusate sodium (SENOKOT-S) 8.6-50 MG tablet Take 1 tablet by mouth daily as needed for constipation.    [name] 20 MG TABS tablet TAKE 1 TABLET BY MOUTH ONCE DAILY WITH SUPPER 30 tablet 2   No current facility-administered medications for this visit.   Facility-Administered  Medications Ordered in Other Visits  Medication Dose Route Frequency Provider Last Rate Last Admin  . acetaminophen (TYLENOL) tablet 650 mg  650 mg Oral Once Nicholas Lose, MD      . heparin lock flush 100 unit/mL  500 Units Intracatheter Once PRN Curt Bears, MD      . pembrolizumab [facility]) 200 mg in sodium chloride 0.9 % 50 mL chemo infusion  200 mg Intravenous Once Curt Bears, MD      . sodium chloride flush (NS) 0.9 % injection 10 mL  10 mL Intracatheter PRN Curt Bears, MD        SURGICAL HISTORY:  Past Surgical History:  Procedure Laterality Date  . BRONCHIAL BIOPSY  11/23/2017   Procedure: BRONCHIAL BIOPSIES;  Surgeon: Grace Isaac, MD;  Location: [facility] OR;  Service: Thoracic;;  . cataract surgery  2007, 2008   repair bilat  . DG BARIUM ENEMA ([facility] [place] [facility]) N/A 2016   Lisa [name]  . HERNIA REPAIR  04/25/2001  . HIP ARTHROPLASTY Left 05/31/2019   Procedure: LEFT HIP HEMIARTHROPLASTY;  Surgeon: Hiram Gash, MD;  Location: [facility];  Service: Orthopedics;  Laterality: Left;  . IR IMAGING GUIDED PORT INSERTION  07/05/2018  . IR US GUIDE BX ASP/DRAIN  11/17/2017  . KNEE SURGERY     meniscus tear per Dr. Ronnie Derby, R knee  . MULTIPLE TOOTH EXTRACTIONS    . SEPTOPLASTY  2006   Dr. Ernesto Rutherford  . SKIN CANCER EXCISION    . TONSILLECTOMY  1954  . VAGINAL HYSTERECTOMY    . VIDEO BRONCHOSCOPY WITH ENDOBRONCHIAL ULTRASOUND N/A 11/23/2017   Procedure: VIDEO BRONCHOSCOPY WITH ENDOBRONCHIAL ULTRASOUND;  Surgeon: Grace Isaac, MD;  Location: [facility];  Service: Thoracic;  Laterality: N/A;    REVIEW OF SYSTEMS:   Review of Systems  Constitutional: Positive for fatigue (unchanged ). negative for appetite change, chills, fever and unexpected weight change.  HENT: Negative for mouth sores, nosebleeds, sore throat and trouble swallowing.   Eyes: Negative for eye problems and icterus.  Respiratory: Negative for cough, hemoptysis, shortness of breath and wheezing.  Cardiovascular:  Negative for chest pain and leg swelling.  Gastrointestinal: Negative for abdominal pain, constipation, diarrhea, nausea and vomiting.  Genitourinary: Negative for bladder incontinence, difficulty urinating, dysuria, frequency and hematuria.   Musculoskeletal: Negative for back pain, gait problem, neck pain and neck stiffness.  Skin: Negative for itching and rash.  Neurological: Negative for dizziness, extremity weakness, headaches, light-headedness and seizures.  Hematological: Negative for adenopathy. Does not bruise/bleed easily.  Psychiatric/Behavioral: Negative for confusion, depression and sleep disturbance. The patient is not nervous/anxious.     PHYSICAL EXAMINATION:  Blood pressure (!) 139/96, pulse 97, temperature 98.7 F (37.1 C), temperature source Temporal, resp. rate 18, height 5\' 7"  (1.702 m), weight 144 lb 1.6 oz (65.4 kg), SpO2 97 %.  ECOG PERFORMANCE STATUS: 2 - Symptomatic, <50% confined to bed  Physical Exam  Constitutional: Oriented to person, place, and time and well-developed, well-nourished, and in no distress. HENT:  Head: Normocephalic and atraumatic.  Mouth/Throat:  Oropharynx is clear and moist. No oropharyngeal exudate.  Eyes: Conjunctivae are normal. Right eye exhibits no discharge. Left eye exhibits no discharge. No scleral icterus.  Neck: Normal range of motion. Neck supple.  Cardiovascular: Normal rate, regular rhythm, normal heart sounds and intact distal pulses.   Pulmonary/Chest: Effort normal and breath sounds normal. No respiratory distress. No wheezes. No rales.  Abdominal: Soft. Bowel sounds are normal. Exhibits no distension and no mass. There is no tenderness.  Musculoskeletal: Normal range of motion. Exhibits no edema.  Lymphadenopathy:    No cervical adenopathy.  Neurological: Alert and oriented to person, place, and time. Exhibits normal muscle tone. Examined in the wheelchair.  Skin: Skin is warm and dry. No rash noted. Not diaphoretic. No  erythema. No pallor.  Psychiatric: Mood and judgment normal. Has some memory impairment.  Vitals reviewed.  LABORATORY DATA: Lab Results  Component Value Date   WBC 4.8 07/19/2019   HGB 9.6 (L) 07/19/2019   HCT 31.2 (L) 07/19/2019   MCV 96.9 07/19/2019   PLT 80 (L) 07/19/2019      Chemistry      Component Value Date/Time   NA 144 07/19/2019 1440   K 4.1 07/19/2019 1440   CL 107 07/19/2019 1440   CO2 26 07/19/2019 1440   BUN 24 (H) 07/19/2019 1440   CREATININE 1.21 (H) 07/19/2019 1440      Component Value Date/Time   CALCIUM 9.2 07/19/2019 1440   ALKPHOS 108 07/19/2019 1440   AST 19 07/19/2019 1440   ALT 9 07/19/2019 1440   BILITOT 0.4 07/19/2019 1440       RADIOGRAPHIC STUDIES:  CT Chest Wo Contrast  Result Date: 06/26/2019 CLINICAL DATA:  Restaging lung cancer. EXAM: CT CHEST WITHOUT CONTRAST TECHNIQUE: Multidetector CT imaging of the chest was performed following the standard protocol without IV contrast. COMPARISON:  PET-CT 03/21/2019 and CT chest 03/07/2019 FINDINGS: Cardiovascular: There is aneurysmal dilatation of the ascending thoracic aorta which measures up to 5 cm, image 77/2. Aortic atherosclerosis. Mediastinum/Nodes: Thyroid gland is either atrophic or surgically absent. The trachea appears patent and is midline. No mediastinal adenopathy. Hilar structures are suboptimally evaluated due to lack of IV contrast material. Normal appearance of the esophagus. Lungs/Pleura: Small to moderate right pleural effusion is increased in volume from previous exam. Again seen are changes secondary to external beam radiation involving the perihilar right lung. The FDG avid perihilar lung mass is similar to the previous exam measuring approximately 6.3 x 4.8 cm, image 73/2. Previously 6.6 x 4.6 cm similar appearance of tiny, nonspecific right lung nodules. For example right middle lobe lung nodule measures 2 mm, image 54/7. Upper Abdomen: No acute abnormality. Musculoskeletal:  Scoliosis and mild degenerative disc disease. Unchanged chronic compression deformity involving the T12 inferior endplate with 73% loss of the anterior vertebral body height. No acute or suspicious osseous findings IMPRESSION: 1. Stable appearance of FDG avid right perihilar lung mass compatible with recurrent neoplasm in the central aspect of the right lung. No new sites of disease identified at this time. 2. Increase in volume of right pleural effusion. 3. Stable ascending thoracic aortic aneurysm. Ascending thoracic aortic aneurysm. Recommend semi-annual imaging followup by CTA or MRA and referral to cardiothoracic surgery if not already obtained. This recommendation follows 2010 ACCF/AHA/AATS/ACR/ASA/SCA/SCAI/SIR/STS/SVM Guidelines for the Diagnosis and Management of Patients With Thoracic Aortic Disease. Circulation. 2010; 121: A193-X902. Aortic aneurysm NOS (ICD10-I71.9) Aortic Atherosclerosis (ICD10-I70.0). Aortic aneurysm NOS (ICD10-I71.9). Electronically Signed   By: Queen Slough.D.  On: 06/26/2019 15:37     ASSESSMENT/PLAN:  This is a very pleasant 87 year old Caucasian female withrecurrent non-small cell lung cancer initially diagnosed stage IIIB (T3, N3, M0) non-small cell lung cancer, squamous cell carcinoma presented with large right middle lobe lung mass in addition to right hilar, subcarinal and right supraclavicular lymphadenopathy diagnosed in August 2019.The patient had evidence for disease progression in December 2020.  She completed a course of concurrent chemoradiation with carboplatin for an AUC of 2 and paclitaxel 5 mg/m. She is status post 6 cycles with a partial response.  She then completed consolidation immunotherapy with Imfinzi 10 mg/kg IV every 2 weeks. She is status post 26 cycles. She showed evidence of disease progression on her restaging CT scan.  The patient is currently undergoing treatment with carboplatin for an AUC of 5, paclitaxel 175 mg/m, Keytruda  200 mg IV every 3 weeks with Neulasta support. She is status post 4 cycles. She recently was hospitalized for a left hip fracture. Her treatment was on hold while she was recovering.   Labs were reviewed. Recommend that she proceed with cycle #5, which will begin maintenance single agent Keytruda.   We will see her back for a follow up visit in 3 weeks for evaluation before starting cycle #6.   The patient was advised to call immediately if she has any concerning symptoms in the interval. The patient voices understanding of current disease status and treatment options and is in agreement with the current care plan. All questions were answered. The patient knows to call the clinic with any problems, questions or concerns. We can certainly see the patient much sooner if necessary.   Orders Placed This Encounter  Procedures  . CBC with Differential (Cancer Center Only)    Standing Status:   Standing    Number of Occurrences:   18    Standing Expiration Date:   07/18/2020  . CMP ([facility] only)    Standing Status:   Standing    Number of Occurrences:   18    Standing Expiration Date:   07/18/2020     Cassandra L Heilingoetter, PA-C 07/19/19

## 2019-07-18 ENCOUNTER — Other Ambulatory Visit: Payer: Self-pay | Admitting: Medical Oncology

## 2019-07-18 DIAGNOSIS — C3491 Malignant neoplasm of unspecified part of right bronchus or lung: Secondary | ICD-10-CM

## 2019-07-19 ENCOUNTER — Other Ambulatory Visit: Payer: Self-pay

## 2019-07-19 ENCOUNTER — Inpatient Hospital Stay: Payer: Medicare Other

## 2019-07-19 ENCOUNTER — Inpatient Hospital Stay (HOSPITAL_BASED_OUTPATIENT_CLINIC_OR_DEPARTMENT_OTHER): Payer: Medicare Other | Admitting: Physician Assistant

## 2019-07-19 VITALS — BP 139/96 | HR 97 | Temp 98.7°F | Resp 18 | Ht 67.0 in | Wt 144.1 lb

## 2019-07-19 DIAGNOSIS — E039 Hypothyroidism, unspecified: Secondary | ICD-10-CM | POA: Diagnosis not present

## 2019-07-19 DIAGNOSIS — Z5112 Encounter for antineoplastic immunotherapy: Secondary | ICD-10-CM | POA: Diagnosis not present

## 2019-07-19 DIAGNOSIS — Z85820 Personal history of malignant melanoma of skin: Secondary | ICD-10-CM | POA: Diagnosis not present

## 2019-07-19 DIAGNOSIS — C3491 Malignant neoplasm of unspecified part of right bronchus or lung: Secondary | ICD-10-CM

## 2019-07-19 DIAGNOSIS — I10 Essential (primary) hypertension: Secondary | ICD-10-CM | POA: Diagnosis not present

## 2019-07-19 DIAGNOSIS — Z7901 Long term (current) use of anticoagulants: Secondary | ICD-10-CM | POA: Diagnosis not present

## 2019-07-19 DIAGNOSIS — C3431 Malignant neoplasm of lower lobe, right bronchus or lung: Secondary | ICD-10-CM | POA: Diagnosis not present

## 2019-07-19 DIAGNOSIS — Z9221 Personal history of antineoplastic chemotherapy: Secondary | ICD-10-CM | POA: Diagnosis not present

## 2019-07-19 DIAGNOSIS — E785 Hyperlipidemia, unspecified: Secondary | ICD-10-CM | POA: Diagnosis not present

## 2019-07-19 DIAGNOSIS — Z923 Personal history of irradiation: Secondary | ICD-10-CM | POA: Diagnosis not present

## 2019-07-19 DIAGNOSIS — R5383 Other fatigue: Secondary | ICD-10-CM | POA: Diagnosis not present

## 2019-07-19 DIAGNOSIS — Z79899 Other long term (current) drug therapy: Secondary | ICD-10-CM | POA: Diagnosis not present

## 2019-07-19 LAB — CBC WITH DIFFERENTIAL (CANCER CENTER ONLY)
Abs Immature Granulocytes: 0.01 10*3/uL (ref 0.00–0.07)
Basophils Absolute: 0 10*3/uL (ref 0.0–0.1)
Basophils Relative: 0 %
Eosinophils Absolute: 0 10*3/uL (ref 0.0–0.5)
Eosinophils Relative: 0 %
HCT: 31.2 % — ABNORMAL LOW (ref 36.0–46.0)
Hemoglobin: 9.6 g/dL — ABNORMAL LOW (ref 12.0–15.0)
Immature Granulocytes: 0 %
Lymphocytes Relative: 20 %
Lymphs Abs: 0.9 10*3/uL (ref 0.7–4.0)
MCH: 29.8 pg (ref 26.0–34.0)
MCHC: 30.8 g/dL (ref 30.0–36.0)
MCV: 96.9 fL (ref 80.0–100.0)
Monocytes Absolute: 0.4 10*3/uL (ref 0.1–1.0)
Monocytes Relative: 8 %
Neutro Abs: 3.5 10*3/uL (ref 1.7–7.7)
Neutrophils Relative %: 72 %
Platelet Count: 80 10*3/uL — ABNORMAL LOW (ref 150–400)
RBC: 3.22 MIL/uL — ABNORMAL LOW (ref 3.87–5.11)
RDW: 16 % — ABNORMAL HIGH (ref 11.5–15.5)
WBC Count: 4.8 10*3/uL (ref 4.0–10.5)
nRBC: 0 % (ref 0.0–0.2)

## 2019-07-19 LAB — CMP (CANCER CENTER ONLY)
ALT: 9 U/L (ref 0–44)
AST: 19 U/L (ref 15–41)
Albumin: 3 g/dL — ABNORMAL LOW (ref 3.5–5.0)
Alkaline Phosphatase: 108 U/L (ref 38–126)
Anion gap: 11 (ref 5–15)
BUN: 24 mg/dL — ABNORMAL HIGH (ref 8–23)
CO2: 26 mmol/L (ref 22–32)
Calcium: 9.2 mg/dL (ref 8.9–10.3)
Chloride: 107 mmol/L (ref 98–111)
Creatinine: 1.21 mg/dL — ABNORMAL HIGH (ref 0.44–1.00)
GFR, Est AFR Am: 48 mL/min — ABNORMAL LOW (ref >60)
GFR, Estimated: 41 mL/min — ABNORMAL LOW (ref >60)
Glucose, Bld: 117 mg/dL — ABNORMAL HIGH (ref 70–99)
Potassium: 4.1 mmol/L (ref 3.5–5.1)
Sodium: 144 mmol/L (ref 135–145)
Total Bilirubin: 0.4 mg/dL (ref 0.3–1.2)
Total Protein: 6.4 g/dL — ABNORMAL LOW (ref 6.5–8.1)

## 2019-07-19 MED ORDER — SODIUM CHLORIDE 0.9 % IV SOLN
Freq: Once | INTRAVENOUS | Status: AC
  Filled 2019-07-19: qty 250

## 2019-07-19 MED ORDER — SODIUM CHLORIDE 0.9 % IV SOLN
200.0000 mg | Freq: Once | INTRAVENOUS | Status: AC
  Administered 2019-07-19: 200 mg via INTRAVENOUS
  Filled 2019-07-19: qty 8

## 2019-07-19 MED ORDER — SODIUM CHLORIDE 0.9% FLUSH
10.0000 mL | INTRAVENOUS | Status: DC | PRN
  Administered 2019-07-19: 10 mL
  Filled 2019-07-19: qty 10

## 2019-07-19 MED ORDER — HEPARIN SOD (PORK) LOCK FLUSH 100 UNIT/ML IV SOLN
500.0000 [IU] | Freq: Once | INTRAVENOUS | Status: AC | PRN
  Administered 2019-07-19: 500 [IU]
  Filled 2019-07-19: qty 5

## 2019-07-19 NOTE — Progress Notes (Signed)
Okay to treat with plt 80 per Cassandra H. PA

## 2019-07-19 NOTE — Patient Instructions (Signed)
[facility] Discharge Instructions for Patients Receiving Chemotherapy  Today you received the following chemotherapy agents:  Keytruda.  To help prevent nausea and vomiting after your treatment, we encourage you to take your nausea medication as directed.   If you develop nausea and vomiting that is not controlled by your nausea medication, call the clinic.   BELOW ARE SYMPTOMS THAT SHOULD BE REPORTED IMMEDIATELY:  *FEVER GREATER THAN 100.5 F  *CHILLS WITH OR WITHOUT FEVER  NAUSEA AND VOMITING THAT IS NOT CONTROLLED WITH YOUR NAUSEA MEDICATION  *UNUSUAL SHORTNESS OF BREATH  *UNUSUAL BRUISING OR BLEEDING  TENDERNESS IN MOUTH AND THROAT WITH OR WITHOUT PRESENCE OF ULCERS  *URINARY PROBLEMS  *BOWEL PROBLEMS  UNUSUAL RASH Items with * indicate a potential emergency and should be followed up as soon as possible.  Feel free to call the clinic should you have any questions or concerns. The clinic phone number is [phone].  Please show the CHEMO ALERT CARD at check-in to the Emergency Department and triage nurse.    

## 2019-07-20 ENCOUNTER — Telehealth: Payer: Self-pay | Admitting: Physician Assistant

## 2019-07-20 LAB — TSH: TSH: 4.503 u[IU]/mL — ABNORMAL HIGH (ref 0.308–3.960)

## 2019-07-20 NOTE — Telephone Encounter (Signed)
Scheduled per los. Called, not able to leave msg. Mailed printout  

## 2019-07-21 ENCOUNTER — Ambulatory Visit: Payer: Medicare Other

## 2019-07-24 ENCOUNTER — Encounter: Payer: Self-pay | Admitting: Internal Medicine

## 2019-08-03 NOTE — Progress Notes (Signed)
Pharmacist Chemotherapy Monitoring - Follow Up Assessment    I verify that I have reviewed each item in the below checklist:  . Regimen for the patient is scheduled for the appropriate day and plan matches scheduled date. Marland Kitchen Appropriate non-routine labs are ordered dependent on drug ordered. . If applicable, additional medications reviewed and ordered per protocol based on lifetime cumulative doses and/or treatment regimen.   Plan for follow-up and/or issues identified: No . I-vent associated with next due treatment: No . MD and/or nursing notified: No  Lisa [name] 08/03/2019 1:13 PM

## 2019-08-08 NOTE — Progress Notes (Signed)
[facility] OFFICE PROGRESS NOTE  [name], MD [place] [address]  DIAGNOSIS: Recurrent non-small cell lung cancer initially diagnosed stage IIIB (T3, N3, M0) non-small cell lung cancer, squamous cell carcinoma presented with large right middle lobe lung mass in addition to right hilar, subcarinal and right supraclavicular lymphadenopathy diagnosed in August 2019.Lisa [name] had evidence for disease progression in December 2020.  PRIOR THERAPY: 1) Concurrent chemoradiation with weekly carboplatin for AUC of 2 and paclitaxel 45 NG/M2. Status post 6 cycles with partial response. 2) Consolidation immunotherapy with Imfinzi 10 mg/KG every 2 weeks started February 28, 2018. Status post 26 cycles. Last cycle was given on February 21, 2019  CURRENT THERAPY: Systemic chemotherapy with carboplatin for an AUC of 5, paclitaxel 175 mg/m and Keytruda 200 mg IV every 3 weeks with Neulasta support. First dose on April 05, 2018.Status post 6 cycles.Starting from cycle #5 Lisa [name] will be on single agent maintenance Keytruda.   INTERVAL HISTORY: Lisa [name] 86 y.o. female returns to Lisa clinic for a follow up visit accompanied by her granddaughter, [name]. Lisa [name] is feeling well today without any concerning complaints. Lisa [name] had a challenging time in March 2021 with a fall and hip fracture. Lisa [name] then had a challenging time with cycle #4, which was her last round of treatment that included chemotherapy, with ~36 hours of delirium, pancytopenia, and incontinence. Lisa [name] is now on maintenance single agent immunotherapy with Keytruda, which Lisa [name] tolerates well. Lisa [name] lives at home with her son and husband. Lisa [name] has some cognitive dysfunction/memory problems. Lisa [name] had a mini mental status exam performed by her PCP in Lisa past which demonstrated some mild dementia. Lisa [name]'s granddaughter arranged for home health assistant after her recent hip  fracture; however, Lisa [name] did not wish to continue having home health services due to disliking having strangers in her home.  Lisa [name] has a walker at home to assist with ambulation.  Lisa [name] had a DVT back in January 2020 and has been on anticoagulation since that time.  Lisa [name]'s granddaughter inquired about discontinuing her anticoagulation considering her recent falls, which is reasonable.  Lisa [name] tolerated her most recent cycle of treatment well with maintenance keytruda without any adverse side effects. Lisa [name] denies any fevers, chills, or night sweats. Lisa [name] goes out to eat for most meals. Her grandaughter kees a refrigerator stocked with ensure at Lisa [name]'s home. Lisa [name] denies any chest pain, shortness of breath, cough, or hemoptysis. Lisa [name] denies any nausea, vomiting, diarrhea, or constipation. Lisa [name] denies any headache or visual changes.  Lisa [name] is here for evaluation before starting cycle #6.    MEDICAL HISTORY: Past Medical History:  Diagnosis Date  . Diverticulosis   . Headache   . Hemorrhoids    internal and external  . Hyperlipidemia 01/29/1991  . Hypertension 10/29/1995  . Hypothyroidism 10/29/1995  . Lung mass    right middle lobe  . Melanoma ([place])    h/o, local excision. no chemo or rady.   . Skin cancer (melanoma) ([place])   . Tubular adenoma of colon   . Wears dentures     ALLERGIES:  is allergic to atorvastatin; celebrex [celecoxib]; cholecalciferol; simvastatin; and tape.  MEDICATIONS:  Current Outpatient Medications  Medication Sig Dispense Refill  . albuterol (VENTOLIN HFA) 108 (90 Base) MCG/ACT inhaler Inhale 2 puffs into Lisa lungs every 6 (six) hours as needed for wheezing or shortness of breath. 6.7 g 0  .  amLODipine (NORVASC) 10 MG tablet Take 1 tablet by mouth once daily 90 tablet 2  . Cholecalciferol (VITAMIN D3) 50 MCG (2000 UT) TABS Take 2,000 Units by mouth 3 (three) times a week.    . Coenzyme Q10 100 MG capsule Take 100 mg by mouth  daily.     Marland Kitchen docusate sodium (COLACE) 100 MG capsule Take 100 mg by mouth daily as needed for mild constipation.    . fluticasone (FLONASE) 50 MCG/ACT nasal spray USE 2 SPRAYS IN EACH NOSTRIL DAILY AS NEEDED FOR ALLERGIES 48 g 3  . levothyroxine (SYNTHROID, LEVOTHROID) 75 MCG tablet Take 1 tablet by mouth once daily 90 tablet 3  . loratadine (CLARITIN) 10 MG tablet Take 1 tablet (10 mg total) by mouth daily as needed for allergies or rhinitis.    . mirtazapine (REMERON) 7.5 MG tablet TAKE 1 TABLET BY MOUTH EVERY DAY AT BEDTIME 30 tablet 2  . Multiple Vitamin (MULTIVITAMIN WITH MINERALS) TABS tablet Take 1 tablet by mouth daily.    . sennosides-docusate sodium (SENOKOT-S) 8.6-50 MG tablet Take 1 tablet by mouth daily as needed for constipation.     No current facility-administered medications for this visit.   Facility-Administered Medications Ordered in Other Visits  Medication Dose Route Frequency Provider Last Rate Last Admin  . acetaminophen (TYLENOL) tablet 650 mg  650 mg Oral Once Nicholas Lose, MD        SURGICAL HISTORY:  Past Surgical History:  Procedure Laterality Date  . BRONCHIAL BIOPSY  11/23/2017   Procedure: BRONCHIAL BIOPSIES;  Surgeon: Grace Isaac, MD;  Location: [facility] OR;  Service: Thoracic;;  . cataract surgery  2007, 2008   repair bilat  . DG BARIUM ENEMA ([place] HX) N/A 2016   [name]  . HERNIA REPAIR  04/25/2001  . HIP ARTHROPLASTY Left 05/31/2019   Procedure: LEFT HIP HEMIARTHROPLASTY;  Surgeon: Hiram Gash, MD;  Location: [facility];  Service: Orthopedics;  Laterality: Left;  . IR IMAGING GUIDED PORT INSERTION  07/05/2018  . IR US GUIDE BX ASP/DRAIN  11/17/2017  . KNEE SURGERY     meniscus tear per Dr. Ronnie Derby, R knee  . MULTIPLE TOOTH EXTRACTIONS    . SEPTOPLASTY  2006   Dr. Ernesto Rutherford  . SKIN CANCER EXCISION    . TONSILLECTOMY  1954  . VAGINAL HYSTERECTOMY    . VIDEO BRONCHOSCOPY WITH ENDOBRONCHIAL ULTRASOUND N/A 11/23/2017   Procedure: VIDEO BRONCHOSCOPY WITH  ENDOBRONCHIAL ULTRASOUND;  Surgeon: Grace Isaac, MD;  Location: [facility];  Service: Thoracic;  Laterality: N/A;    REVIEW OF SYSTEMS:   Review of Systems  Constitutional: Negative for appetite change, chills, fatigue, fever and unexpected weight change.  HENT:   Negative for mouth sores, nosebleeds, sore throat and trouble swallowing.   Eyes: Negative for eye problems and icterus.  Respiratory: Negative for cough, hemoptysis, shortness of breath and wheezing.  Cardiovascular: Positive for bilateral baseline lower extremity swelling. Negative for chest pain. Gastrointestinal: Negative for abdominal pain, constipation, diarrhea, nausea and vomiting.  Genitourinary: Negative for bladder incontinence, difficulty urinating, dysuria, frequency and hematuria.   Musculoskeletal: Negative for back pain, gait problem, neck pain and neck stiffness.  Skin: Negative for itching and rash.  Neurological: Positive for poor balance. Negative for dizziness, extremity weakness, headaches, light-headedness and seizures.  Hematological: Negative for adenopathy. Does not bruise/bleed easily.  Psychiatric/Behavioral: Mild memory deficits. Negative for confusion, depression and sleep disturbance. Lisa [name] is not nervous/anxious.     PHYSICAL EXAMINATION:  Blood  pressure 113/76, pulse 91, temperature 98.9 F (37.2 C), temperature source Temporal, resp. rate 18, height 5\' 7"  (1.702 m), weight 142 lb 9.6 oz (64.7 kg), SpO2 100 %.  ECOG PERFORMANCE STATUS: 2 - Symptomatic, <50% confined to bed  Physical Exam  Constitutional: Oriented to person, place, and time and elderly appearing female and in no distress. No distress.  HENT:  Head: Normocephalic and atraumatic.  Mouth/Throat: Oropharynx is clear and moist. No oropharyngeal exudate.  Eyes: Conjunctivae are normal. Right eye exhibits no discharge. Left eye exhibits no discharge. No scleral icterus.  Neck: Normal range of motion. Neck supple.   Cardiovascular: Normal rate, regular rhythm, normal heart sounds and intact distal pulses.   Pulmonary/Chest: Effort normal and breath sounds normal. No respiratory distress. No wheezes. No rales.  Abdominal: Soft. Bowel sounds are normal. Exhibits no distension and no mass. There is no tenderness.  Musculoskeletal: Bilateral lower extremity swelling. Normal range of motion.   Lymphadenopathy:    No cervical adenopathy.  Neurological: Alert and oriented to person, place, and time. Exhibits normal muscle tone. Lisa [name] was examined in Lisa wheel chair.  Skin: Skin is warm and dry. No rash noted. Not diaphoretic. No erythema. No pallor.  Psychiatric: Mood, memory and judgment normal.  Vitals reviewed.  LABORATORY DATA: Lab Results  Component Value Date   WBC 5.1 08/09/2019   HGB 8.6 (L) 08/09/2019   HCT 27.8 (L) 08/09/2019   MCV 94.9 08/09/2019   PLT 135 (L) 08/09/2019      Chemistry      Component Value Date/Time   NA 144 08/09/2019 1020   K 3.8 08/09/2019 1020   CL 108 08/09/2019 1020   CO2 25 08/09/2019 1020   BUN 23 08/09/2019 1020   CREATININE 1.21 (H) 08/09/2019 1020      Component Value Date/Time   CALCIUM 9.4 08/09/2019 1020   ALKPHOS 98 08/09/2019 1020   AST 15 08/09/2019 1020   ALT 8 08/09/2019 1020   BILITOT 0.4 08/09/2019 1020       RADIOGRAPHIC STUDIES:  No results found.   ASSESSMENT/PLAN:  This is a very pleasant 86 year old Caucasian female withrecurrent non-small cell lung cancer initially diagnosed stage IIIB (T3, N3, M0) non-small cell lung cancer, squamous cell carcinoma presented with large right middle lobe lung mass in addition to right hilar, subcarinal and right supraclavicular lymphadenopathy diagnosed in August 2019.Lisa [name] had evidence for disease progression in December 2020.  Lisa [name] completed a course of concurrent chemoradiation with carboplatin for an AUC of 2 and paclitaxel 5 mg/m. Lisa [name] is status post 6 cycles with a partial  response.  Lisa [name] then completed consolidation immunotherapy with Imfinzi 10 mg/kg IV every 2 weeks. Lisa [name] is status post 26 cycles. Lisa [name] showed evidence of disease progression on her restaging CT scan.  Lisa [name] is currently undergoing treatment with carboplatin for an AUC of 5, paclitaxel 175 mg/m, Keytruda 200 mg IV every 3 weeks with Neulasta support.Lisa [name] is status post 5 cycles. Lisa [name] was hospitalized for a left hip fracture in March 2021. Her treatment was on hold while Lisa [name] was recovering. Starting from cycle #5, Lisa [name] started maintenance treatment with single agent immunotherapy with Keytruda 200 mg IV every 3 weeks.  Lisa [name] was seen with Dr. Julien Nordmann today. Labs were reviewed. Recommend that Lisa [name] proceed with cycle #6 today scheduled. Discussed that Lisa plan is to continue on [place] unless unacceptable toxicity or evidence of disease progression as long as Lisa [name] is agreeable to treatment.  Lisa [name] has been tolerating single agent Keytruda well.   I will arrange for Lisa [name]'s restaging CT scan to be performed prior to her next cycle of treatment.  We will see her back for a follow up visit in 3 weeks for evaluation and to review her scan results before starting cycle #7.   Discussed that since Lisa [name] has been on Xarelto for >1 year, it is acceptable to discontinue her blood thinner, especially in light of her recent falls.   Lisa [name] was advised to call immediately if Lisa [name] has any concerning symptoms in Lisa interval. Lisa [name] voices understanding of current disease status and treatment options and is in agreement with Lisa current care plan. All questions were answered. Lisa [name] knows to call Lisa clinic with any problems, questions or concerns. We can certainly see Lisa [name] much sooner if necessary    Orders Placed This Encounter  Procedures  . CT Chest Wo Contrast    Standing Status:   Future    Standing Expiration Date:   08/08/2020    Order  Specific Question:   ** REASON FOR EXAM (FREE TEXT)    Answer:   Restaging Lung Cancer    Order Specific Question:   Preferred imaging location?    Answer:   [facility]    Order Specific Question:   Radiology Contrast Protocol - do NOT remove file path    Answer:   [email or web address]     Sondos Wolfman L Praise Stennett, PA-C 08/09/19  ADDENDUM: Hematology/Oncology Attending: I had a face-to-face encounter with Lisa [name] today.  I recommended her care plan.  This is a very pleasant 86 years old white female with recurrent non-small cell lung cancer, squamous cell carcinoma treated with 4 cycles of induction chemotherapy with carboplatin, paclitaxel and Keytruda and Lisa [name] is currently on maintenance treatment with single agent Keytruda status post 1 cycle.  Lisa [name] is feeling fine today with no concerning complaints except for fatigue and memory issues.  Lisa [name] is here today for evaluation before starting cycle #6. Lisa [name] has been on treatment with Xarelto for Lisa last year because of history of DVT.  I recommended for her to discontinue her treatment with Xarelto at this point based on Lisa concern of her granddaughter because of Lisa frequent falls. I recommended for Lisa [name] to proceed with cycle #6 today as planned and we will arrange for her to have repeat CT scan of Lisa chest, abdomen pelvis for restaging of her disease before Lisa next cycle of her treatment. For Lisa memory issues, Lisa [name] will discuss with her primary care physician for any other recommendation. Lisa [name] was advised to call immediately if Lisa [name] has any concerning symptoms in Lisa interval.  Her granddaughter had several questions and we answered them completely to their satisfaction.  Disclaimer: This note was dictated with voice recognition software. Similar sounding words can inadvertently be transcribed and may be missed upon review. Eilleen Kempf, MD 08/09/19

## 2019-08-09 ENCOUNTER — Inpatient Hospital Stay: Payer: Medicare Other

## 2019-08-09 ENCOUNTER — Encounter: Payer: Self-pay | Admitting: Physician Assistant

## 2019-08-09 ENCOUNTER — Inpatient Hospital Stay: Payer: Medicare Other | Attending: Internal Medicine | Admitting: Physician Assistant

## 2019-08-09 ENCOUNTER — Other Ambulatory Visit: Payer: Self-pay

## 2019-08-09 ENCOUNTER — Other Ambulatory Visit: Payer: Self-pay | Admitting: Family Medicine

## 2019-08-09 VITALS — BP 113/76 | HR 91 | Temp 98.9°F | Resp 18 | Ht 67.0 in | Wt 142.6 lb

## 2019-08-09 DIAGNOSIS — Z85820 Personal history of malignant melanoma of skin: Secondary | ICD-10-CM | POA: Diagnosis not present

## 2019-08-09 DIAGNOSIS — C7989 Secondary malignant neoplasm of other specified sites: Secondary | ICD-10-CM | POA: Insufficient documentation

## 2019-08-09 DIAGNOSIS — Z86718 Personal history of other venous thrombosis and embolism: Secondary | ICD-10-CM | POA: Insufficient documentation

## 2019-08-09 DIAGNOSIS — I10 Essential (primary) hypertension: Secondary | ICD-10-CM | POA: Diagnosis not present

## 2019-08-09 DIAGNOSIS — E039 Hypothyroidism, unspecified: Secondary | ICD-10-CM | POA: Insufficient documentation

## 2019-08-09 DIAGNOSIS — F0390 Unspecified dementia without behavioral disturbance: Secondary | ICD-10-CM | POA: Diagnosis not present

## 2019-08-09 DIAGNOSIS — Z86010 Personal history of colonic polyps: Secondary | ICD-10-CM | POA: Insufficient documentation

## 2019-08-09 DIAGNOSIS — Z7901 Long term (current) use of anticoagulants: Secondary | ICD-10-CM | POA: Diagnosis not present

## 2019-08-09 DIAGNOSIS — C3491 Malignant neoplasm of unspecified part of right bronchus or lung: Secondary | ICD-10-CM

## 2019-08-09 DIAGNOSIS — Z79899 Other long term (current) drug therapy: Secondary | ICD-10-CM | POA: Insufficient documentation

## 2019-08-09 DIAGNOSIS — Z5112 Encounter for antineoplastic immunotherapy: Secondary | ICD-10-CM | POA: Diagnosis not present

## 2019-08-09 DIAGNOSIS — C342 Malignant neoplasm of middle lobe, bronchus or lung: Secondary | ICD-10-CM | POA: Insufficient documentation

## 2019-08-09 DIAGNOSIS — Z9221 Personal history of antineoplastic chemotherapy: Secondary | ICD-10-CM | POA: Diagnosis not present

## 2019-08-09 DIAGNOSIS — E785 Hyperlipidemia, unspecified: Secondary | ICD-10-CM | POA: Insufficient documentation

## 2019-08-09 LAB — CBC WITH DIFFERENTIAL (CANCER CENTER ONLY)
Abs Immature Granulocytes: 0.02 10*3/uL (ref 0.00–0.07)
Basophils Absolute: 0 10*3/uL (ref 0.0–0.1)
Basophils Relative: 0 %
Eosinophils Absolute: 0 10*3/uL (ref 0.0–0.5)
Eosinophils Relative: 1 %
HCT: 27.8 % — ABNORMAL LOW (ref 36.0–46.0)
Hemoglobin: 8.6 g/dL — ABNORMAL LOW (ref 12.0–15.0)
Immature Granulocytes: 0 %
Lymphocytes Relative: 18 %
Lymphs Abs: 0.9 10*3/uL (ref 0.7–4.0)
MCH: 29.4 pg (ref 26.0–34.0)
MCHC: 30.9 g/dL (ref 30.0–36.0)
MCV: 94.9 fL (ref 80.0–100.0)
Monocytes Absolute: 0.4 10*3/uL (ref 0.1–1.0)
Monocytes Relative: 8 %
Neutro Abs: 3.7 10*3/uL (ref 1.7–7.7)
Neutrophils Relative %: 73 %
Platelet Count: 135 10*3/uL — ABNORMAL LOW (ref 150–400)
RBC: 2.93 MIL/uL — ABNORMAL LOW (ref 3.87–5.11)
RDW: 15.9 % — ABNORMAL HIGH (ref 11.5–15.5)
WBC Count: 5.1 10*3/uL (ref 4.0–10.5)
nRBC: 0 % (ref 0.0–0.2)

## 2019-08-09 LAB — CMP (CANCER CENTER ONLY)
ALT: 8 U/L (ref 0–44)
AST: 15 U/L (ref 15–41)
Albumin: 3.1 g/dL — ABNORMAL LOW (ref 3.5–5.0)
Alkaline Phosphatase: 98 U/L (ref 38–126)
Anion gap: 11 (ref 5–15)
BUN: 23 mg/dL (ref 8–23)
CO2: 25 mmol/L (ref 22–32)
Calcium: 9.4 mg/dL (ref 8.9–10.3)
Chloride: 108 mmol/L (ref 98–111)
Creatinine: 1.21 mg/dL — ABNORMAL HIGH (ref 0.44–1.00)
GFR, Est AFR Am: 47 mL/min — ABNORMAL LOW (ref >60)
GFR, Estimated: 41 mL/min — ABNORMAL LOW (ref >60)
Glucose, Bld: 92 mg/dL (ref 70–99)
Potassium: 3.8 mmol/L (ref 3.5–5.1)
Sodium: 144 mmol/L (ref 135–145)
Total Bilirubin: 0.4 mg/dL (ref 0.3–1.2)
Total Protein: 6.4 g/dL — ABNORMAL LOW (ref 6.5–8.1)

## 2019-08-09 LAB — TSH: TSH: 1.975 u[IU]/mL (ref 0.308–3.960)

## 2019-08-09 MED ORDER — HEPARIN SOD (PORK) LOCK FLUSH 100 UNIT/ML IV SOLN
500.0000 [IU] | Freq: Once | INTRAVENOUS | Status: AC | PRN
  Administered 2019-08-09: 500 [IU]
  Filled 2019-08-09: qty 5

## 2019-08-09 MED ORDER — SODIUM CHLORIDE 0.9 % IV SOLN
Freq: Once | INTRAVENOUS | Status: AC
  Filled 2019-08-09: qty 250

## 2019-08-09 MED ORDER — SODIUM CHLORIDE 0.9 % IV SOLN
200.0000 mg | Freq: Once | INTRAVENOUS | Status: AC
  Administered 2019-08-09: 200 mg via INTRAVENOUS
  Filled 2019-08-09: qty 8

## 2019-08-09 MED ORDER — SODIUM CHLORIDE 0.9% FLUSH
10.0000 mL | INTRAVENOUS | Status: DC | PRN
  Administered 2019-08-09: 10 mL
  Filled 2019-08-09: qty 10

## 2019-08-09 NOTE — Patient Instructions (Signed)
[facility] Discharge Instructions for Patients Receiving Chemotherapy  Today you received the following Immunotherapy: Keytruda  To help prevent nausea and vomiting after your treatment, we encourage you to take your nausea medication as directed by your MD.   If you develop nausea and vomiting that is not controlled by your nausea medication, call the clinic.   BELOW ARE SYMPTOMS THAT SHOULD BE REPORTED IMMEDIATELY:  *FEVER GREATER THAN 100.5 F  *CHILLS WITH OR WITHOUT FEVER  NAUSEA AND VOMITING THAT IS NOT CONTROLLED WITH YOUR NAUSEA MEDICATION  *UNUSUAL SHORTNESS OF BREATH  *UNUSUAL BRUISING OR BLEEDING  TENDERNESS IN MOUTH AND THROAT WITH OR WITHOUT PRESENCE OF ULCERS  *URINARY PROBLEMS  *BOWEL PROBLEMS  UNUSUAL RASH Items with * indicate a potential emergency and should be followed up as soon as possible.  Feel free to call the clinic should you have any questions or concerns. The clinic phone number is [phone].  Please show the [facility] at check-in to the Emergency Department and triage nurse.  Coronavirus (COVID-19) Are you at risk?  Are you at risk for the Coronavirus (COVID-19)?  To be considered HIGH RISK for Coronavirus (COVID-19), you have to meet the following criteria:  . Traveled to [place], [place], [place], [place] or [place]; or in the [place] to [place], [place], [place], or [place]; and have fever, cough, and shortness of breath within the last 2 weeks of travel OR . Been in close contact with a person diagnosed with COVID-19 within the last 2 weeks and have fever, cough, and shortness of breath . IF YOU DO NOT MEET THESE CRITERIA, YOU ARE CONSIDERED LOW RISK FOR COVID-19.  What to do if you are HIGH RISK for COVID-19?  Marland Kitchen If you are having a medical emergency, call 911. . Seek medical care right away. Before you go to a doctor's office, urgent care or emergency department, call ahead and tell them about your  recent travel, contact with someone diagnosed with COVID-19, and your symptoms. You should receive instructions from your physician's office regarding next steps of care.  . When you arrive at healthcare provider, tell the healthcare staff immediately you have returned from visiting [place], [place], [place], [place] or [place]; or traveled in the [place] to [place], [place], [place], or [place]; in the last two weeks or you have been in close contact with a person diagnosed with COVID-19 in the last 2 weeks.   . Tell the health care staff about your symptoms: fever, cough and shortness of breath. . After you have been seen by a medical provider, you will be either: o Tested for (COVID-19) and discharged home on quarantine except to seek medical care if symptoms worsen, and asked to  - Stay home and avoid contact with others until you get your results (4-5 days)  - Avoid travel on public transportation if possible (such as bus, train, or airplane) or o Sent to the Emergency Department by EMS for evaluation, COVID-19 testing, and possible admission depending on your condition and test results.  What to do if you are LOW RISK for COVID-19?  Reduce your risk of any infection by using the same precautions used for avoiding the common cold or flu:  Marland Kitchen Wash your hands often with soap and warm water for at least 20 seconds.  If soap and water are not readily available, use an alcohol-based hand sanitizer with at least 60% alcohol.  . If coughing  or sneezing, cover your mouth and nose by coughing or sneezing into the elbow areas of your shirt or coat, into a tissue or into your sleeve (not your hands). . Avoid shaking hands with others and consider head nods or verbal greetings only. . Avoid touching your eyes, nose, or mouth with unwashed hands.  . Avoid close contact with people who are sick. . Avoid places or events with large numbers of people in one location, like concerts or sporting  events. . Carefully consider travel plans you have or are making. . If you are planning any travel outside or inside the [place], visit the CDC's Travelers' Health webpage for the latest health notices. . If you have some symptoms but not all symptoms, continue to monitor at home and seek medical attention if your symptoms worsen. . If you are having a medical emergency, call 911.   [place] / e-Visit: [email or web address]         [facility]: [facility]: [phone]                   [facility]: [phone]

## 2019-08-11 ENCOUNTER — Telehealth: Payer: Self-pay | Admitting: Family Medicine

## 2019-08-11 NOTE — Telephone Encounter (Signed)
Lisa [name],Lisa [name], called. Patient has been using Levothyroxine.  They received a rx for Euthyrox. They wanted to make sure patient is switching from Levothyroxine to Euthyrox.

## 2019-08-11 NOTE — Telephone Encounter (Signed)
Spoke with Stanton Kidney at [facility].  Apparently their system is sending refill requests for Euthyrox.  Medication will be kept consistent with Levothyroxine.

## 2019-08-12 DIAGNOSIS — R2681 Unsteadiness on feet: Secondary | ICD-10-CM | POA: Diagnosis not present

## 2019-08-12 DIAGNOSIS — I82409 Acute embolism and thrombosis of unspecified deep veins of unspecified lower extremity: Secondary | ICD-10-CM | POA: Diagnosis not present

## 2019-08-12 DIAGNOSIS — S72002D Fracture of unspecified part of neck of left femur, subsequent encounter for closed fracture with routine healing: Secondary | ICD-10-CM | POA: Diagnosis not present

## 2019-08-12 DIAGNOSIS — M6281 Muscle weakness (generalized): Secondary | ICD-10-CM | POA: Diagnosis not present

## 2019-08-15 ENCOUNTER — Telehealth: Payer: Self-pay

## 2019-08-15 NOTE — Telephone Encounter (Signed)
Lisa [name] with [employer] left v/m requesting update on medical condition after hip fx and update on pts cognition. Pt last had virtual on 06/20/19 for FU rehab.Please advise.

## 2019-08-16 NOTE — Telephone Encounter (Signed)
I do not know if I have authorization to disclose any information to her.  Do not give her any information until we have clearance to release information.  For charting purposes, last visit I had with the patient was on 06/20/2019

## 2019-08-17 ENCOUNTER — Other Ambulatory Visit: Payer: Self-pay | Admitting: Family Medicine

## 2019-08-17 NOTE — Telephone Encounter (Signed)
Tried the number for Lisa [name] and it says "we're sorry, you call cannot be completed as dialed." In order to release any health information the pt will need to sign a release of information form stating she approves of information going to [place] at [place]

## 2019-08-18 NOTE — Telephone Encounter (Signed)
I received the same message as Larene Beach did on the previous note.

## 2019-08-22 DIAGNOSIS — S72002D Fracture of unspecified part of neck of left femur, subsequent encounter for closed fracture with routine healing: Secondary | ICD-10-CM | POA: Diagnosis not present

## 2019-08-29 ENCOUNTER — Other Ambulatory Visit: Payer: Self-pay

## 2019-08-29 ENCOUNTER — Ambulatory Visit (HOSPITAL_COMMUNITY)
Admission: RE | Admit: 2019-08-29 | Discharge: 2019-08-29 | Disposition: A | Discharge status: Home / Self Care | Payer: Medicare Other | Source: Ambulatory Visit | Attending: Physician Assistant | Admitting: Physician Assistant

## 2019-08-29 DIAGNOSIS — J90 Pleural effusion, not elsewhere classified: Secondary | ICD-10-CM | POA: Diagnosis not present

## 2019-08-29 DIAGNOSIS — C3491 Malignant neoplasm of unspecified part of right bronchus or lung: Secondary | ICD-10-CM | POA: Diagnosis not present

## 2019-08-29 DIAGNOSIS — C3490 Malignant neoplasm of unspecified part of unspecified bronchus or lung: Secondary | ICD-10-CM | POA: Diagnosis not present

## 2019-08-31 ENCOUNTER — Inpatient Hospital Stay: Payer: Medicare Other | Attending: Internal Medicine | Admitting: Internal Medicine

## 2019-08-31 ENCOUNTER — Encounter: Payer: Self-pay | Admitting: Internal Medicine

## 2019-08-31 ENCOUNTER — Other Ambulatory Visit: Payer: Self-pay

## 2019-08-31 ENCOUNTER — Inpatient Hospital Stay: Payer: Medicare Other

## 2019-08-31 ENCOUNTER — Telehealth: Payer: Self-pay | Admitting: Nutrition

## 2019-08-31 ENCOUNTER — Inpatient Hospital Stay: Payer: Medicare Other | Admitting: Nutrition

## 2019-08-31 VITALS — BP 141/92 | HR 88 | Temp 97.3°F | Resp 17 | Ht 67.0 in | Wt 138.8 lb

## 2019-08-31 DIAGNOSIS — E785 Hyperlipidemia, unspecified: Secondary | ICD-10-CM | POA: Diagnosis not present

## 2019-08-31 DIAGNOSIS — Z79899 Other long term (current) drug therapy: Secondary | ICD-10-CM | POA: Diagnosis not present

## 2019-08-31 DIAGNOSIS — C342 Malignant neoplasm of middle lobe, bronchus or lung: Secondary | ICD-10-CM | POA: Diagnosis present

## 2019-08-31 DIAGNOSIS — C7989 Secondary malignant neoplasm of other specified sites: Secondary | ICD-10-CM | POA: Insufficient documentation

## 2019-08-31 DIAGNOSIS — C3491 Malignant neoplasm of unspecified part of right bronchus or lung: Secondary | ICD-10-CM

## 2019-08-31 DIAGNOSIS — E039 Hypothyroidism, unspecified: Secondary | ICD-10-CM | POA: Insufficient documentation

## 2019-08-31 DIAGNOSIS — D649 Anemia, unspecified: Secondary | ICD-10-CM | POA: Diagnosis not present

## 2019-08-31 DIAGNOSIS — I10 Essential (primary) hypertension: Secondary | ICD-10-CM | POA: Diagnosis not present

## 2019-08-31 DIAGNOSIS — R0609 Other forms of dyspnea: Secondary | ICD-10-CM | POA: Diagnosis not present

## 2019-08-31 DIAGNOSIS — Z95828 Presence of other vascular implants and grafts: Secondary | ICD-10-CM

## 2019-08-31 DIAGNOSIS — Z5112 Encounter for antineoplastic immunotherapy: Secondary | ICD-10-CM

## 2019-08-31 DIAGNOSIS — R5383 Other fatigue: Secondary | ICD-10-CM | POA: Diagnosis not present

## 2019-08-31 DIAGNOSIS — Z85820 Personal history of malignant melanoma of skin: Secondary | ICD-10-CM | POA: Diagnosis not present

## 2019-08-31 DIAGNOSIS — C3432 Malignant neoplasm of lower lobe, left bronchus or lung: Secondary | ICD-10-CM | POA: Insufficient documentation

## 2019-08-31 LAB — CBC WITH DIFFERENTIAL (CANCER CENTER ONLY)
Abs Immature Granulocytes: 0.01 10*3/uL (ref 0.00–0.07)
Basophils Absolute: 0 10*3/uL (ref 0.0–0.1)
Basophils Relative: 0 %
Eosinophils Absolute: 0.1 10*3/uL (ref 0.0–0.5)
Eosinophils Relative: 2 %
HCT: 29.4 % — ABNORMAL LOW (ref 36.0–46.0)
Hemoglobin: 9.2 g/dL — ABNORMAL LOW (ref 12.0–15.0)
Immature Granulocytes: 0 %
Lymphocytes Relative: 22 %
Lymphs Abs: 1 10*3/uL (ref 0.7–4.0)
MCH: 29.4 pg (ref 26.0–34.0)
MCHC: 31.3 g/dL (ref 30.0–36.0)
MCV: 93.9 fL (ref 80.0–100.0)
Monocytes Absolute: 0.4 10*3/uL (ref 0.1–1.0)
Monocytes Relative: 8 %
Neutro Abs: 3.2 10*3/uL (ref 1.7–7.7)
Neutrophils Relative %: 68 %
Platelet Count: 114 10*3/uL — ABNORMAL LOW (ref 150–400)
RBC: 3.13 MIL/uL — ABNORMAL LOW (ref 3.87–5.11)
RDW: 15.4 % (ref 11.5–15.5)
WBC Count: 4.7 10*3/uL (ref 4.0–10.5)
nRBC: 0 % (ref 0.0–0.2)

## 2019-08-31 LAB — CMP (CANCER CENTER ONLY)
ALT: 7 U/L (ref 0–44)
AST: 16 U/L (ref 15–41)
Albumin: 3.1 g/dL — ABNORMAL LOW (ref 3.5–5.0)
Alkaline Phosphatase: 84 U/L (ref 38–126)
Anion gap: 11 (ref 5–15)
BUN: 27 mg/dL — ABNORMAL HIGH (ref 8–23)
CO2: 25 mmol/L (ref 22–32)
Calcium: 10 mg/dL (ref 8.9–10.3)
Chloride: 107 mmol/L (ref 98–111)
Creatinine: 1.38 mg/dL — ABNORMAL HIGH (ref 0.44–1.00)
GFR, Est AFR Am: 40 mL/min — ABNORMAL LOW (ref >60)
GFR, Estimated: 35 mL/min — ABNORMAL LOW (ref >60)
Glucose, Bld: 90 mg/dL (ref 70–99)
Potassium: 3.9 mmol/L (ref 3.5–5.1)
Sodium: 143 mmol/L (ref 135–145)
Total Bilirubin: 0.5 mg/dL (ref 0.3–1.2)
Total Protein: 6.4 g/dL — ABNORMAL LOW (ref 6.5–8.1)

## 2019-08-31 LAB — TSH: TSH: 8.109 u[IU]/mL — ABNORMAL HIGH (ref 0.308–3.960)

## 2019-08-31 MED ORDER — SODIUM CHLORIDE 0.9 % IV SOLN
Freq: Once | INTRAVENOUS | Status: AC
  Filled 2019-08-31: qty 250

## 2019-08-31 MED ORDER — SODIUM CHLORIDE 0.9% FLUSH
10.0000 mL | Freq: Once | INTRAVENOUS | Status: AC
  Administered 2019-08-31: 10 mL
  Filled 2019-08-31: qty 10

## 2019-08-31 MED ORDER — SODIUM CHLORIDE 0.9% FLUSH
10.0000 mL | INTRAVENOUS | Status: DC | PRN
  Administered 2019-08-31: 10 mL
  Filled 2019-08-31: qty 10

## 2019-08-31 MED ORDER — SODIUM CHLORIDE 0.9 % IV SOLN
200.0000 mg | Freq: Once | INTRAVENOUS | Status: AC
  Administered 2019-08-31: 200 mg via INTRAVENOUS
  Filled 2019-08-31: qty 8

## 2019-08-31 MED ORDER — HEPARIN SOD (PORK) LOCK FLUSH 100 UNIT/ML IV SOLN
500.0000 [IU] | Freq: Once | INTRAVENOUS | Status: AC | PRN
  Administered 2019-08-31: 500 [IU]
  Filled 2019-08-31: qty 5

## 2019-08-31 NOTE — Patient Instructions (Signed)
[facility] Discharge Instructions for Patients Receiving Chemotherapy  Today you received the following chemotherapy agents:  Keytruda.  To help prevent nausea and vomiting after your treatment, we encourage you to take your nausea medication as directed.   If you develop nausea and vomiting that is not controlled by your nausea medication, call the clinic.   BELOW ARE SYMPTOMS THAT SHOULD BE REPORTED IMMEDIATELY:  *FEVER GREATER THAN 100.5 F  *CHILLS WITH OR WITHOUT FEVER  NAUSEA AND VOMITING THAT IS NOT CONTROLLED WITH YOUR NAUSEA MEDICATION  *UNUSUAL SHORTNESS OF BREATH  *UNUSUAL BRUISING OR BLEEDING  TENDERNESS IN MOUTH AND THROAT WITH OR WITHOUT PRESENCE OF ULCERS  *URINARY PROBLEMS  *BOWEL PROBLEMS  UNUSUAL RASH Items with * indicate a potential emergency and should be followed up as soon as possible.  Feel free to call the clinic should you have any questions or concerns. The clinic phone number is [phone].  Please show the CHEMO ALERT CARD at check-in to the Emergency Department and triage nurse.    

## 2019-08-31 NOTE — Progress Notes (Signed)
[place] [phone]   [phone]  OFFICE PROGRESS NOTE  [name], MD [place] [address]  DIAGNOSIS: Recurrent non-small cell lung cancer initially diagnosed stage IIIB (T3, N3, M0) non-small cell lung cancer, squamous cell carcinoma presented with large right middle lobe lung mass in addition to right hilar, subcarinal and right supraclavicular lymphadenopathy diagnosed in August 2019.  The patient had evidence for disease progression in December 2020.  PRIOR THERAPY:  1) Concurrent chemoradiation with weekly carboplatin for AUC of 2 and paclitaxel 45 NG/M2.  Status post 6 cycles with partial response. 2) Consolidation immunotherapy with Imfinzi 10 mg/KG every 2 weeks started February 28, 2018.  Status post 26 cycles.  Last cycle was given on February 21, 2019  CURRENT THERAPY: Systemic chemotherapy with carboplatin for AUC of 5, paclitaxel 175 mg/M2 and Keytruda 200 mg IV every 3 weeks.  First dose April 03, 2018.  Status post 6 cycles.  INTERVAL HISTORY: Lisa [name] 86 y.o. female returns to the clinic today for follow-up visit.  The patient is feeling fine today with no concerning complaints except for the baseline fatigue and shortness of breath with exertion.  She denied having any chest pain, cough or hemoptysis.  She denied having any fever or chills.  She has no nausea, vomiting, diarrhea or constipation.  She denied having any headache or visual changes.  She lost few pounds since her last visit.  The patient denied having any fever or chills.  She continues to tolerate her treatment with Keytruda fairly well.  She is here today for evaluation with repeat CT scan of the chest for restaging of her disease.   MEDICAL HISTORY: Past Medical History:  Diagnosis Date  . Diverticulosis   . Headache   . Hemorrhoids    internal and external  . Hyperlipidemia 01/29/1991  . Hypertension 10/29/1995  .  Hypothyroidism 10/29/1995  . Lung mass    right middle lobe  . Melanoma ([place])    h/o, local excision. no chemo or rady.   . Skin cancer (melanoma) ([place])   . Tubular adenoma of colon   . Wears dentures     ALLERGIES:  is allergic to atorvastatin; celebrex [celecoxib]; cholecalciferol; simvastatin; and tape.  MEDICATIONS:  Current Outpatient Medications  Medication Sig Dispense Refill  . albuterol (VENTOLIN HFA) 108 (90 Base) MCG/ACT inhaler Inhale 2 puffs into the lungs every 6 (six) hours as needed for wheezing or shortness of breath. 6.7 g 0  . amLODipine (NORVASC) 10 MG tablet Take 1 tablet by mouth once daily 90 tablet 2  . Cholecalciferol (VITAMIN D3) 50 MCG (2000 UT) TABS Take 2,000 Units by mouth 3 (three) times a week.    . Coenzyme Q10 100 MG capsule Take 100 mg by mouth daily.     Marland Kitchen docusate sodium (COLACE) 100 MG capsule Take 100 mg by mouth daily as needed for mild constipation.    Arna Medici 75 MCG tablet Take 1 tablet by mouth once daily 90 tablet 0  . fluticasone (FLONASE) 50 MCG/ACT nasal spray USE 2 SPRAYS IN EACH NOSTRIL DAILY AS NEEDED FOR ALLERGIES 48 g 3  . levothyroxine (SYNTHROID) 75 MCG tablet Take 75 mcg by mouth daily before breakfast.    . loratadine (CLARITIN) 10 MG tablet Take 1 tablet (10 mg total) by mouth daily as needed for allergies or rhinitis.    . mirtazapine (REMERON) 7.5 MG tablet TAKE 1  TABLET BY MOUTH EVERY DAY AT BEDTIME 30 tablet 2  . Multiple Vitamin (MULTIVITAMIN WITH MINERALS) TABS tablet Take 1 tablet by mouth daily.    . sennosides-docusate sodium (SENOKOT-S) 8.6-50 MG tablet Take 1 tablet by mouth daily as needed for constipation.     No current facility-administered medications for this visit.   Facility-Administered Medications Ordered in Other Visits  Medication Dose Route Frequency Provider Last Rate Last Admin  . acetaminophen (TYLENOL) tablet 650 mg  650 mg Oral Once Nicholas Lose, MD        SURGICAL HISTORY:  Past Surgical  History:  Procedure Laterality Date  . BRONCHIAL BIOPSY  11/23/2017   Procedure: BRONCHIAL BIOPSIES;  Surgeon: Grace Isaac, MD;  Location: [facility] [place] OR;  Service: Thoracic;;  . cataract surgery  2007, 2008   repair bilat  . DG BARIUM ENEMA ([place] HX) N/A 2016   [name]  . HERNIA REPAIR  04/25/2001  . HIP ARTHROPLASTY Left 05/31/2019   Procedure: LEFT HIP HEMIARTHROPLASTY;  Surgeon: Hiram Gash, MD;  Location: [facility];  Service: Orthopedics;  Laterality: Left;  . IR IMAGING GUIDED PORT INSERTION  07/05/2018  . IR US GUIDE BX ASP/DRAIN  11/17/2017  . KNEE SURGERY     meniscus tear per Dr. Ronnie Derby, R knee  . MULTIPLE TOOTH EXTRACTIONS    . SEPTOPLASTY  2006   Dr. Ernesto Rutherford  . SKIN CANCER EXCISION    . TONSILLECTOMY  1954  . VAGINAL HYSTERECTOMY    . VIDEO BRONCHOSCOPY WITH ENDOBRONCHIAL ULTRASOUND N/A 11/23/2017   Procedure: VIDEO BRONCHOSCOPY WITH ENDOBRONCHIAL ULTRASOUND;  Surgeon: Grace Isaac, MD;  Location: [place];  Service: Thoracic;  Laterality: N/A;    REVIEW OF SYSTEMS:  Constitutional: positive for fatigue Eyes: negative Ears, nose, mouth, throat, and face: negative Respiratory: positive for dyspnea on exertion Cardiovascular: negative Gastrointestinal: negative Genitourinary:negative Integument/breast: negative Hematologic/lymphatic: negative Musculoskeletal:negative Neurological: negative Behavioral/Psych: negative Endocrine: negative Allergic/Immunologic: negative   PHYSICAL EXAMINATION: General appearance: alert, cooperative, fatigued and no distress Head: Normocephalic, without obvious abnormality, atraumatic Neck: no adenopathy, no JVD, supple, symmetrical, trachea midline and thyroid not enlarged, symmetric, no tenderness/mass/nodules Lymph nodes: Cervical, supraclavicular, and axillary nodes normal. Resp: clear to auscultation bilaterally Back: symmetric, no curvature. ROM normal. No CVA tenderness. Cardio: regular rate and rhythm, S1, S2 normal, no murmur,  click, rub or gallop GI: soft, non-tender; bowel sounds normal; no masses,  no organomegaly Extremities: extremities normal, atraumatic, no cyanosis or edema Neurologic: Alert and oriented X 3, normal strength and tone. Normal symmetric reflexes. Normal coordination and gait  ECOG PERFORMANCE STATUS: 1 - Symptomatic but completely ambulatory  Blood pressure (!) 141/92, pulse 88, temperature (!) 97.3 F (36.3 C), temperature source Temporal, resp. rate 17, height 5\' 7"  (1.702 m), weight 138 lb 12.8 oz (63 kg), SpO2 99 %.  LABORATORY DATA: Lab Results  Component Value Date   WBC 4.7 08/31/2019   HGB 9.2 (L) 08/31/2019   HCT 29.4 (L) 08/31/2019   MCV 93.9 08/31/2019   PLT 114 (L) 08/31/2019      Chemistry      Component Value Date/Time   NA 144 08/09/2019 1020   K 3.8 08/09/2019 1020   CL 108 08/09/2019 1020   CO2 25 08/09/2019 1020   BUN 23 08/09/2019 1020   CREATININE 1.21 (H) 08/09/2019 1020      Component Value Date/Time   CALCIUM 9.4 08/09/2019 1020   ALKPHOS 98 08/09/2019 1020   AST 15 08/09/2019 1020  ALT 8 08/09/2019 1020   BILITOT 0.4 08/09/2019 1020       RADIOGRAPHIC STUDIES: CT Chest Wo Contrast  Result Date: 08/30/2019 CLINICAL DATA:  Non-small-cell lung cancer.  Restaging. EXAM: CT CHEST WITHOUT CONTRAST TECHNIQUE: Multidetector CT imaging of the chest was performed following the standard protocol without IV contrast. COMPARISON:  06/26/2019 FINDINGS: Cardiovascular: The heart size is normal. No substantial pericardial effusion. Ascending thoracic aorta measures 4.8 cm diameter. SVC. Mediastinum/Nodes: Assessment in mediastinal anatomy limited by lack of intravenous contrast material. No bulky left hilar lymphadenopathy. Abnormal soft tissue in the right hilum is similar to prior. The esophagus has normal imaging features. There is no axillary lymphadenopathy. Lungs/Pleura: Masslike consolidative opacity in the right parahilar lung measures 6.4 x 5.5 cm today,  similar to prior measurements of 6.3 x 4.9 cm. Associated bronchiectasis and volume loss in the right middle lobe and lingula is similar to prior, likely sequelae of radiation therapy. Small loculated right pleural effusion is similar. No new suspicious nodule or mass in the left lung. Upper Abdomen: Hypoattenuating lesions in the right kidney are similar, likely cyst. Left kidney not visualized. Musculoskeletal: No worrisome lytic or sclerotic osseous abnormality. IMPRESSION: 1. Stable exam. No new or progressive findings. 2. Masslike consolidative opacity in the right parahilar lung is similar to prior compatible with known recurrent neoplasm and associated post treatment changes. 3. Similar appearance of the small loculated right pleural effusion. 4. Ascending thoracic aorta remains aneurysmal at 4.8 cm maximum diameter. Continued attention on follow-up recommended. 5. Aortic Atherosclerosis (ICD10-I70.0). Electronically Signed   By: Misty Stanley M.D.   On: 08/30/2019 12:16    ASSESSMENT AND PLAN: This is a very pleasant 86 years old white female with stage IIIb non-small cell lung cancer, squamous cell carcinoma.  She is currently undergoing a course of concurrent chemoradiation with weekly carboplatin and paclitaxel status post 6 cycles.  She tolerated this treatment well with partial response. The patient also completed the course of consolidation treatment with immunotherapy with Imfinzi status post 26 cycles.  She tolerated her previous treatment well. Her recent CT scan of the chest showed progressive consolidation in the right lung suspicious for disease recurrence but also worsening post radiation effect could not be excluded.  A PET scan showed rounded hypermetabolic soft tissue mass inferior to the right hilum consistent with lung cancer recurrence.  There was also hypermetabolic precarinal lymph node concerning for nodal metastasis but no evidence for metastatic disease in the abdomen or pelvis.   The patient also had groundglass density in the right upper lobe, right middle lobe as well as left lower lobe suspicious for inflammatory process and less likely malignancy. The patient started treatment with systemic chemotherapy with carboplatin for AUC of 5, paclitaxel 175 mg/M2 and Keytruda 200 mg IV every 3 weeks with Neulasta support status post 6 cycles.  Starting from cycle #5 the patient is currently on maintenance treatment with single agent Keytruda every 3 weeks.  She tolerated the last cycle of her treatment well with no concerning adverse effects. She had repeat CT scan of the chest performed recently.  I personally and independently reviewed the scan images and discussed the results with the patient today. Her scan showed no concerning findings for disease progression. I recommended for her to continue her current treatment with [facility] and she will proceed with cycle #7 today. For the anemia we will continue to monitor her hemoglobin hematocrit closely and consider the patient for transfusion if needed. She will  come back for follow-up visit in 3 weeks for evaluation before the next cycle of her treatment. She was advised to call immediately if she has any other concerning symptoms in the interval. The patient voices understanding of current disease status and treatment options and is in agreement with the current care plan. All questions were answered. The patient knows to call the clinic with any problems, questions or concerns. We can certainly see the patient much sooner if necessary.  Disclaimer: This note was dictated with voice recognition software. Similar sounding words can inadvertently be transcribed and may not be corrected upon review.

## 2019-08-31 NOTE — Progress Notes (Signed)
See telephone note.

## 2019-08-31 NOTE — Telephone Encounter (Signed)
Patient completed her chemotherapy earlier than scheduled so I was unable to follow-up with her in the infusion room.  I contacted patient by phone and was able to leave a message with her granddaughter.  I have asked for return call and left my name and phone number.

## 2019-08-31 NOTE — Patient Instructions (Signed)

## 2019-09-01 ENCOUNTER — Telehealth: Payer: Self-pay | Admitting: Internal Medicine

## 2019-09-01 NOTE — Telephone Encounter (Signed)
Scheduled per los. Called and left msg. Mailed printout  °

## 2019-09-05 ENCOUNTER — Other Ambulatory Visit: Payer: Self-pay | Admitting: Physician Assistant

## 2019-09-05 ENCOUNTER — Telehealth: Payer: Self-pay | Admitting: *Deleted

## 2019-09-05 DIAGNOSIS — E039 Hypothyroidism, unspecified: Secondary | ICD-10-CM

## 2019-09-05 NOTE — Telephone Encounter (Signed)
PA Cassandra Heilingoetter requested nurse to reach out to patient to confirm which version of thyroid medication she is taking.  Historical medication of levothryoxine 75 mcg was added but Euthyrox 75 mcg was ordered recently by Dr. Damita Dunnings.  With recent increase in her TSH lab Cassandra Heilingoetter, PA needs to refill but increase the dose to 38mcg of the actual drug name she is taking whether it be Euthyrox or Synthroid.  Need to ensure patient isn't double dosing.   Attempted to reach patients granddaughter who is the best point of contact, Left message pending call back.  Upon return of call need to relay information to [employer], PA.

## 2019-09-12 DIAGNOSIS — S72002D Fracture of unspecified part of neck of left femur, subsequent encounter for closed fracture with routine healing: Secondary | ICD-10-CM | POA: Diagnosis not present

## 2019-09-12 DIAGNOSIS — R2681 Unsteadiness on feet: Secondary | ICD-10-CM | POA: Diagnosis not present

## 2019-09-12 DIAGNOSIS — M6281 Muscle weakness (generalized): Secondary | ICD-10-CM | POA: Diagnosis not present

## 2019-09-12 DIAGNOSIS — I82409 Acute embolism and thrombosis of unspecified deep veins of unspecified lower extremity: Secondary | ICD-10-CM | POA: Diagnosis not present

## 2019-09-14 NOTE — Progress Notes (Signed)
Pharmacist Chemotherapy Monitoring - Follow Up Assessment    I verify that I have reviewed each item in the below checklist:  . Regimen for the patient is scheduled for the appropriate day and plan matches scheduled date. Marland Kitchen Appropriate non-routine labs are ordered dependent on drug ordered. . If applicable, additional medications reviewed and ordered per protocol based on lifetime cumulative doses and/or treatment regimen.   Plan for follow-up and/or issues identified: Yes . I-vent associated with next due treatment: Yes . MD and/or nursing notified: No   Kennith Center, Pharm.D., CPP 09/14/2019@9 :21 AM

## 2019-09-20 ENCOUNTER — Other Ambulatory Visit: Payer: Self-pay

## 2019-09-20 ENCOUNTER — Inpatient Hospital Stay: Payer: Medicare Other

## 2019-09-20 ENCOUNTER — Encounter: Payer: Self-pay | Admitting: Internal Medicine

## 2019-09-20 ENCOUNTER — Inpatient Hospital Stay: Payer: Medicare Other | Admitting: Nutrition

## 2019-09-20 ENCOUNTER — Inpatient Hospital Stay: Payer: Medicare Other | Admitting: Internal Medicine

## 2019-09-20 VITALS — BP 122/81 | HR 93 | Temp 97.5°F | Resp 18 | Ht 67.0 in | Wt 140.0 lb

## 2019-09-20 DIAGNOSIS — C3432 Malignant neoplasm of lower lobe, left bronchus or lung: Secondary | ICD-10-CM | POA: Diagnosis not present

## 2019-09-20 DIAGNOSIS — C3491 Malignant neoplasm of unspecified part of right bronchus or lung: Secondary | ICD-10-CM

## 2019-09-20 DIAGNOSIS — R0609 Other forms of dyspnea: Secondary | ICD-10-CM | POA: Diagnosis not present

## 2019-09-20 DIAGNOSIS — Z85820 Personal history of malignant melanoma of skin: Secondary | ICD-10-CM | POA: Diagnosis not present

## 2019-09-20 DIAGNOSIS — C7989 Secondary malignant neoplasm of other specified sites: Secondary | ICD-10-CM | POA: Diagnosis not present

## 2019-09-20 DIAGNOSIS — Z79899 Other long term (current) drug therapy: Secondary | ICD-10-CM | POA: Diagnosis not present

## 2019-09-20 DIAGNOSIS — Z5112 Encounter for antineoplastic immunotherapy: Secondary | ICD-10-CM | POA: Diagnosis not present

## 2019-09-20 DIAGNOSIS — R5383 Other fatigue: Secondary | ICD-10-CM | POA: Diagnosis not present

## 2019-09-20 DIAGNOSIS — E785 Hyperlipidemia, unspecified: Secondary | ICD-10-CM | POA: Diagnosis not present

## 2019-09-20 DIAGNOSIS — D649 Anemia, unspecified: Secondary | ICD-10-CM | POA: Diagnosis not present

## 2019-09-20 DIAGNOSIS — I10 Essential (primary) hypertension: Secondary | ICD-10-CM

## 2019-09-20 DIAGNOSIS — E039 Hypothyroidism, unspecified: Secondary | ICD-10-CM | POA: Diagnosis not present

## 2019-09-20 DIAGNOSIS — Z95828 Presence of other vascular implants and grafts: Secondary | ICD-10-CM

## 2019-09-20 LAB — CBC WITH DIFFERENTIAL (CANCER CENTER ONLY)
Abs Immature Granulocytes: 0.01 10*3/uL (ref 0.00–0.07)
Basophils Absolute: 0 10*3/uL (ref 0.0–0.1)
Basophils Relative: 1 %
Eosinophils Absolute: 0.1 10*3/uL (ref 0.0–0.5)
Eosinophils Relative: 1 %
HCT: 28.9 % — ABNORMAL LOW (ref 36.0–46.0)
Hemoglobin: 8.9 g/dL — ABNORMAL LOW (ref 12.0–15.0)
Immature Granulocytes: 0 %
Lymphocytes Relative: 20 %
Lymphs Abs: 0.7 10*3/uL (ref 0.7–4.0)
MCH: 29 pg (ref 26.0–34.0)
MCHC: 30.8 g/dL (ref 30.0–36.0)
MCV: 94.1 fL (ref 80.0–100.0)
Monocytes Absolute: 0.3 10*3/uL (ref 0.1–1.0)
Monocytes Relative: 9 %
Neutro Abs: 2.6 10*3/uL (ref 1.7–7.7)
Neutrophils Relative %: 69 %
Platelet Count: 111 10*3/uL — ABNORMAL LOW (ref 150–400)
RBC: 3.07 MIL/uL — ABNORMAL LOW (ref 3.87–5.11)
RDW: 15.3 % (ref 11.5–15.5)
WBC Count: 3.7 10*3/uL — ABNORMAL LOW (ref 4.0–10.5)
nRBC: 0 % (ref 0.0–0.2)

## 2019-09-20 LAB — CMP (CANCER CENTER ONLY)
ALT: 10 U/L (ref 0–44)
AST: 14 U/L — ABNORMAL LOW (ref 15–41)
Albumin: 3 g/dL — ABNORMAL LOW (ref 3.5–5.0)
Alkaline Phosphatase: 80 U/L (ref 38–126)
Anion gap: 9 (ref 5–15)
BUN: 20 mg/dL (ref 8–23)
CO2: 26 mmol/L (ref 22–32)
Calcium: 9.7 mg/dL (ref 8.9–10.3)
Chloride: 108 mmol/L (ref 98–111)
Creatinine: 1.45 mg/dL — ABNORMAL HIGH (ref 0.44–1.00)
GFR, Est AFR Am: 38 mL/min — ABNORMAL LOW (ref >60)
GFR, Estimated: 33 mL/min — ABNORMAL LOW (ref >60)
Glucose, Bld: 104 mg/dL — ABNORMAL HIGH (ref 70–99)
Potassium: 3.8 mmol/L (ref 3.5–5.1)
Sodium: 143 mmol/L (ref 135–145)
Total Bilirubin: 0.5 mg/dL (ref 0.3–1.2)
Total Protein: 6.7 g/dL (ref 6.5–8.1)

## 2019-09-20 LAB — TSH: TSH: 5.394 u[IU]/mL — ABNORMAL HIGH (ref 0.308–3.960)

## 2019-09-20 MED ORDER — HEPARIN SOD (PORK) LOCK FLUSH 100 UNIT/ML IV SOLN
500.0000 [IU] | Freq: Once | INTRAVENOUS | Status: AC | PRN
  Administered 2019-09-20: 500 [IU]
  Filled 2019-09-20: qty 5

## 2019-09-20 MED ORDER — SODIUM CHLORIDE 0.9% FLUSH
10.0000 mL | Freq: Once | INTRAVENOUS | Status: AC
  Administered 2019-09-20: 10 mL
  Filled 2019-09-20: qty 10

## 2019-09-20 MED ORDER — SODIUM CHLORIDE 0.9% FLUSH
10.0000 mL | INTRAVENOUS | Status: DC | PRN
  Administered 2019-09-20: 10 mL
  Filled 2019-09-20: qty 10

## 2019-09-20 MED ORDER — SODIUM CHLORIDE 0.9 % IV SOLN
200.0000 mg | Freq: Once | INTRAVENOUS | Status: AC
  Administered 2019-09-20: 200 mg via INTRAVENOUS
  Filled 2019-09-20: qty 8

## 2019-09-20 MED ORDER — SODIUM CHLORIDE 0.9 % IV SOLN
Freq: Once | INTRAVENOUS | Status: AC
  Filled 2019-09-20: qty 250

## 2019-09-20 NOTE — Progress Notes (Signed)
[place] [phone]   [phone]  OFFICE PROGRESS NOTE  Lisa Ghent, MD [place] [address]  DIAGNOSIS: Recurrent non-small cell lung cancer initially diagnosed stage IIIB (T3, N3, M0) non-small cell lung cancer, squamous cell carcinoma presented with large right middle lobe lung mass in addition to right hilar, subcarinal and right supraclavicular lymphadenopathy diagnosed in August 2019.  The patient had evidence for disease progression in December 2020.  PRIOR THERAPY:  1) Concurrent chemoradiation with weekly carboplatin for AUC of 2 and paclitaxel 45 NG/M2.  Status post 6 cycles with partial response. 2) Consolidation immunotherapy with Imfinzi 10 mg/KG every 2 weeks started February 28, 2018.  Status post 26 cycles.  Last cycle was given on February 21, 2019  CURRENT THERAPY: Systemic chemotherapy with carboplatin for AUC of 5, paclitaxel 175 mg/M2 and Keytruda 200 mg IV every 3 weeks.  First dose April 03, 2018.  Status post 7 cycles.  INTERVAL HISTORY: Lisa [name] 86 y.o. female returns to the clinic today for follow-up visit.  The patient is feeling fine today with no concerning complaints except for the generalized fatigue.  She denied having any current chest pain, shortness of breath, cough or hemoptysis.  She has no nausea, vomiting, diarrhea or constipation.  She has no headache or visual changes.  She continues to tolerate her treatment with Keytruda fairly well.  The patient is here today for evaluation before starting cycle #8.   MEDICAL HISTORY: Past Medical History:  Diagnosis Date  . Diverticulosis   . Headache   . Hemorrhoids    internal and external  . Hyperlipidemia 01/29/1991  . Hypertension 10/29/1995  . Hypothyroidism 10/29/1995  . Lung mass    right middle lobe  . Melanoma ([place])    h/o, local excision. no chemo or rady.   . Skin cancer (melanoma) ([place])   . Tubular adenoma of  colon   . Wears dentures     ALLERGIES:  is allergic to atorvastatin, celebrex [celecoxib], cholecalciferol, simvastatin, and tape.  MEDICATIONS:  Current Outpatient Medications  Medication Sig Dispense Refill  . albuterol (VENTOLIN HFA) 108 (90 Base) MCG/ACT inhaler Inhale 2 puffs into the lungs every 6 (six) hours as needed for wheezing or shortness of breath. 6.7 g 0  . amLODipine (NORVASC) 10 MG tablet Take 1 tablet by mouth once daily 90 tablet 2  . Cholecalciferol (VITAMIN D3) 50 MCG (2000 UT) TABS Take 2,000 Units by mouth 3 (three) times a week.    . Coenzyme Q10 100 MG capsule Take 100 mg by mouth daily.     Marland Kitchen docusate sodium (COLACE) 100 MG capsule Take 100 mg by mouth daily as needed for mild constipation.    Arna Medici 75 MCG tablet Take 1 tablet by mouth once daily 90 tablet 0  . fluticasone (FLONASE) 50 MCG/ACT nasal spray USE 2 SPRAYS IN EACH NOSTRIL DAILY AS NEEDED FOR ALLERGIES 48 g 3  . levothyroxine (SYNTHROID) 75 MCG tablet Take 75 mcg by mouth daily before breakfast.    . loratadine (CLARITIN) 10 MG tablet Take 1 tablet (10 mg total) by mouth daily as needed for allergies or rhinitis.    . mirtazapine (REMERON) 7.5 MG tablet TAKE 1 TABLET BY MOUTH EVERY DAY AT BEDTIME 30 tablet 2  . Multiple Vitamin (MULTIVITAMIN WITH MINERALS) TABS tablet Take 1 tablet by mouth daily.    . sennosides-docusate sodium (SENOKOT-S) 8.6-50 MG tablet Take  1 tablet by mouth daily as needed for constipation.     No current facility-administered medications for this visit.   Facility-Administered Medications Ordered in Other Visits  Medication Dose Route Frequency Provider Last Rate Last Admin  . acetaminophen (TYLENOL) tablet 650 mg  650 mg Oral Once Nicholas Lose, MD        SURGICAL HISTORY:  Past Surgical History:  Procedure Laterality Date  . BRONCHIAL BIOPSY  11/23/2017   Procedure: BRONCHIAL BIOPSIES;  Surgeon: Grace Isaac, MD;  Location: [facility] OR;  Service: Thoracic;;  .  cataract surgery  2007, 2008   repair bilat  . DG BARIUM ENEMA ([place] HX) N/A 2016   [name]  . HERNIA REPAIR  04/25/2001  . HIP ARTHROPLASTY Left 05/31/2019   Procedure: LEFT HIP HEMIARTHROPLASTY;  Surgeon: Hiram Gash, MD;  Location: [place];  Service: Orthopedics;  Laterality: Left;  . IR IMAGING GUIDED PORT INSERTION  07/05/2018  . IR US GUIDE BX ASP/DRAIN  11/17/2017  . KNEE SURGERY     meniscus tear per Dr. Ronnie Derby, R knee  . MULTIPLE TOOTH EXTRACTIONS    . SEPTOPLASTY  2006   Dr. Ernesto Rutherford  . SKIN CANCER EXCISION    . TONSILLECTOMY  1954  . VAGINAL HYSTERECTOMY    . VIDEO BRONCHOSCOPY WITH ENDOBRONCHIAL ULTRASOUND N/A 11/23/2017   Procedure: VIDEO BRONCHOSCOPY WITH ENDOBRONCHIAL ULTRASOUND;  Surgeon: Grace Isaac, MD;  Location: [place];  Service: Thoracic;  Laterality: N/A;    REVIEW OF SYSTEMS:  A comprehensive review of systems was negative except for: Constitutional: positive for fatigue Musculoskeletal: positive for muscle weakness   PHYSICAL EXAMINATION: General appearance: alert, cooperative, fatigued and no distress Head: Normocephalic, without obvious abnormality, atraumatic Neck: no adenopathy, no JVD, supple, symmetrical, trachea midline and thyroid not enlarged, symmetric, no tenderness/mass/nodules Lymph nodes: Cervical, supraclavicular, and axillary nodes normal. Resp: clear to auscultation bilaterally Back: symmetric, no curvature. ROM normal. No CVA tenderness. Cardio: regular rate and rhythm, S1, S2 normal, no murmur, click, rub or gallop GI: soft, non-tender; bowel sounds normal; no masses,  no organomegaly Extremities: extremities normal, atraumatic, no cyanosis or edema  ECOG PERFORMANCE STATUS: 1 - Symptomatic but completely ambulatory  Blood pressure 122/81, pulse 93, temperature (!) 97.5 F (36.4 C), temperature source Temporal, resp. rate 18, height 5\' 7"  (1.702 m), weight 140 lb (63.5 kg), SpO2 98 %.  LABORATORY DATA: Lab Results  Component Value  Date   WBC 3.7 (L) 09/20/2019   HGB 8.9 (L) 09/20/2019   HCT 28.9 (L) 09/20/2019   MCV 94.1 09/20/2019   PLT 111 (L) 09/20/2019      Chemistry      Component Value Date/Time   NA 143 08/31/2019 0900   K 3.9 08/31/2019 0900   CL 107 08/31/2019 0900   CO2 25 08/31/2019 0900   BUN 27 (H) 08/31/2019 0900   CREATININE 1.38 (H) 08/31/2019 0900      Component Value Date/Time   CALCIUM 10.0 08/31/2019 0900   ALKPHOS 84 08/31/2019 0900   AST 16 08/31/2019 0900   ALT 7 08/31/2019 0900   BILITOT 0.5 08/31/2019 0900       RADIOGRAPHIC STUDIES: CT Chest Wo Contrast  Result Date: 08/30/2019 CLINICAL DATA:  Non-small-cell lung cancer.  Restaging. EXAM: CT CHEST WITHOUT CONTRAST TECHNIQUE: Multidetector CT imaging of the chest was performed following the standard protocol without IV contrast. COMPARISON:  06/26/2019 FINDINGS: Cardiovascular: The heart size is normal. No substantial pericardial effusion. Ascending thoracic aorta measures  4.8 cm diameter. SVC. Mediastinum/Nodes: Assessment in mediastinal anatomy limited by lack of intravenous contrast material. No bulky left hilar lymphadenopathy. Abnormal soft tissue in the right hilum is similar to prior. The esophagus has normal imaging features. There is no axillary lymphadenopathy. Lungs/Pleura: Masslike consolidative opacity in the right parahilar lung measures 6.4 x 5.5 cm today, similar to prior measurements of 6.3 x 4.9 cm. Associated bronchiectasis and volume loss in the right middle lobe and lingula is similar to prior, likely sequelae of radiation therapy. Small loculated right pleural effusion is similar. No new suspicious nodule or mass in the left lung. Upper Abdomen: Hypoattenuating lesions in the right kidney are similar, likely cyst. Left kidney not visualized. Musculoskeletal: No worrisome lytic or sclerotic osseous abnormality. IMPRESSION: 1. Stable exam. No new or progressive findings. 2. Masslike consolidative opacity in the right  parahilar lung is similar to prior compatible with known recurrent neoplasm and associated post treatment changes. 3. Similar appearance of the small loculated right pleural effusion. 4. Ascending thoracic aorta remains aneurysmal at 4.8 cm maximum diameter. Continued attention on follow-up recommended. 5. Aortic Atherosclerosis (ICD10-I70.0). Electronically Signed   By: Misty Stanley M.D.   On: 08/30/2019 12:16    ASSESSMENT AND PLAN: This is a very pleasant 86 years old white female with stage IIIb non-small cell lung cancer, squamous cell carcinoma.  She is currently undergoing a course of concurrent chemoradiation with weekly carboplatin and paclitaxel status post 6 cycles.  She tolerated this treatment well with partial response. The patient also completed the course of consolidation treatment with immunotherapy with Imfinzi status post 26 cycles.  She tolerated her previous treatment well. Her recent CT scan of the chest showed progressive consolidation in the right lung suspicious for disease recurrence but also worsening post radiation effect could not be excluded.  A PET scan showed rounded hypermetabolic soft tissue mass inferior to the right hilum consistent with lung cancer recurrence.  There was also hypermetabolic precarinal lymph node concerning for nodal metastasis but no evidence for metastatic disease in the abdomen or pelvis.  The patient also had groundglass density in the right upper lobe, right middle lobe as well as left lower lobe suspicious for inflammatory process and less likely malignancy. The patient started treatment with systemic chemotherapy with carboplatin for AUC of 5, paclitaxel 175 mg/M2 and Keytruda 200 mg IV every 3 weeks with Neulasta support status post 7 cycles.  Starting from cycle #5 the patient is currently on maintenance treatment with single agent Keytruda every 3 weeks.   The patient continues to tolerate her treatment with single agent Keytruda fairly well. I  recommended for her to proceed with cycle #8 today as planned. She will come back for follow-up visit in 3 weeks for evaluation before the next cycle of her treatment. She was advised to call immediately if she has any concerning symptoms in the interval. The patient voices understanding of current disease status and treatment options and is in agreement with the current care plan. All questions were answered. The patient knows to call the clinic with any problems, questions or concerns. We can certainly see the patient much sooner if necessary.  Disclaimer: This note was dictated with voice recognition software. Similar sounding words can inadvertently be transcribed and may not be corrected upon review.

## 2019-09-20 NOTE — Patient Instructions (Signed)
[facility] Discharge Instructions for Patients Receiving Chemotherapy  Today you received the following chemotherapy agents:  Keytruda.  To help prevent nausea and vomiting after your treatment, we encourage you to take your nausea medication as directed.   If you develop nausea and vomiting that is not controlled by your nausea medication, call the clinic.   BELOW ARE SYMPTOMS THAT SHOULD BE REPORTED IMMEDIATELY:  *FEVER GREATER THAN 100.5 F  *CHILLS WITH OR WITHOUT FEVER  NAUSEA AND VOMITING THAT IS NOT CONTROLLED WITH YOUR NAUSEA MEDICATION  *UNUSUAL SHORTNESS OF BREATH  *UNUSUAL BRUISING OR BLEEDING  TENDERNESS IN MOUTH AND THROAT WITH OR WITHOUT PRESENCE OF ULCERS  *URINARY PROBLEMS  *BOWEL PROBLEMS  UNUSUAL RASH Items with * indicate a potential emergency and should be followed up as soon as possible.  Feel free to call the clinic should you have any questions or concerns. The clinic phone number is [phone].  Please show the CHEMO ALERT CARD at check-in to the Emergency Department and triage nurse.    

## 2019-09-20 NOTE — Progress Notes (Signed)
Recurrent non-small cell lung cancer initially diagnosed stage IIIB (T3, N3, M0) non-small cell lung cancer, squamous cell carcinoma presented with large right middle lobe lung mass in addition to right hilar, subcarinal and right supraclavicular lymphadenopathy diagnosed in August 2019.  The patient had evidence for disease progression in December 2020.  PRIOR THERAPY:  1) Concurrent chemoradiation with weekly carboplatin for AUC of 2 and paclitaxel 45 NG/M2.  Status post 6 cycles with partial response. 2) Consolidation immunotherapy with Imfinzi 10 mg/KG every 2 weeks started February 28, 2018.  Status post 26 cycles.  Last cycle was given on February 21, 2019  CURRENT THERAPY: Systemic chemotherapy with carboplatin for AUC of 5, paclitaxel 175 mg/M2 and Keytruda 200 mg IV every 3 weeks.  First dose April 03, 2018.  Status post 7 cycles.  Brief nutrition follow-up completed with patient during infusion for non-small cell lung cancer. Patient is very tired as she did not sleep well last night. Current weight documented as 140 pounds on June 23 decreased from 154 pounds March 23.  This is a 9% weight loss over 3 months. Patient states she drinks at least 1 bottle of Ensure every day.  Her family helps to keep her refrigerator stocked. She denies diarrhea or constipation. She has no nutrition complaints today.  Nutrition diagnosis: Unintended weight loss continues.  Intervention: Educated patient to continue strategies for increasing calories and protein throughout the day. Encouraged 1-2 bottles of Ensure every day. Encouraged bowel regimen.  Monitoring, evaluation, goals: Patient will tolerate adequate calories and protein to minimize weight loss.  Next visit: Thursday, August 5 during infusion.  **Disclaimer: This note was dictated with voice recognition software. Similar sounding words can inadvertently be transcribed and this note may contain transcription errors which may not have  been corrected upon publication of note.**

## 2019-10-12 ENCOUNTER — Other Ambulatory Visit: Payer: Self-pay

## 2019-10-12 ENCOUNTER — Inpatient Hospital Stay: Payer: Medicare Other

## 2019-10-12 ENCOUNTER — Inpatient Hospital Stay: Payer: Medicare Other | Attending: Internal Medicine | Admitting: Internal Medicine

## 2019-10-12 ENCOUNTER — Encounter: Payer: Self-pay | Admitting: Internal Medicine

## 2019-10-12 VITALS — BP 142/85 | HR 87 | Temp 97.9°F | Resp 18 | Ht 67.0 in | Wt 142.2 lb

## 2019-10-12 DIAGNOSIS — R05 Cough: Secondary | ICD-10-CM | POA: Insufficient documentation

## 2019-10-12 DIAGNOSIS — C342 Malignant neoplasm of middle lobe, bronchus or lung: Secondary | ICD-10-CM | POA: Insufficient documentation

## 2019-10-12 DIAGNOSIS — E785 Hyperlipidemia, unspecified: Secondary | ICD-10-CM | POA: Diagnosis not present

## 2019-10-12 DIAGNOSIS — I10 Essential (primary) hypertension: Secondary | ICD-10-CM | POA: Insufficient documentation

## 2019-10-12 DIAGNOSIS — Z85820 Personal history of malignant melanoma of skin: Secondary | ICD-10-CM | POA: Diagnosis not present

## 2019-10-12 DIAGNOSIS — M6281 Muscle weakness (generalized): Secondary | ICD-10-CM | POA: Diagnosis not present

## 2019-10-12 DIAGNOSIS — C3490 Malignant neoplasm of unspecified part of unspecified bronchus or lung: Secondary | ICD-10-CM

## 2019-10-12 DIAGNOSIS — R5383 Other fatigue: Secondary | ICD-10-CM | POA: Insufficient documentation

## 2019-10-12 DIAGNOSIS — R2681 Unsteadiness on feet: Secondary | ICD-10-CM | POA: Diagnosis not present

## 2019-10-12 DIAGNOSIS — S72002D Fracture of unspecified part of neck of left femur, subsequent encounter for closed fracture with routine healing: Secondary | ICD-10-CM | POA: Diagnosis not present

## 2019-10-12 DIAGNOSIS — E039 Hypothyroidism, unspecified: Secondary | ICD-10-CM | POA: Diagnosis not present

## 2019-10-12 DIAGNOSIS — Z95828 Presence of other vascular implants and grafts: Secondary | ICD-10-CM

## 2019-10-12 DIAGNOSIS — C3491 Malignant neoplasm of unspecified part of right bronchus or lung: Secondary | ICD-10-CM | POA: Diagnosis not present

## 2019-10-12 DIAGNOSIS — I82409 Acute embolism and thrombosis of unspecified deep veins of unspecified lower extremity: Secondary | ICD-10-CM | POA: Diagnosis not present

## 2019-10-12 DIAGNOSIS — Z79899 Other long term (current) drug therapy: Secondary | ICD-10-CM | POA: Diagnosis not present

## 2019-10-12 DIAGNOSIS — Z5112 Encounter for antineoplastic immunotherapy: Secondary | ICD-10-CM | POA: Insufficient documentation

## 2019-10-12 LAB — CBC WITH DIFFERENTIAL (CANCER CENTER ONLY)
Abs Immature Granulocytes: 0.01 10*3/uL (ref 0.00–0.07)
Basophils Absolute: 0 10*3/uL (ref 0.0–0.1)
Basophils Relative: 1 %
Eosinophils Absolute: 0.1 10*3/uL (ref 0.0–0.5)
Eosinophils Relative: 1 %
HCT: 28.2 % — ABNORMAL LOW (ref 36.0–46.0)
Hemoglobin: 8.6 g/dL — ABNORMAL LOW (ref 12.0–15.0)
Immature Granulocytes: 0 %
Lymphocytes Relative: 24 %
Lymphs Abs: 1 10*3/uL (ref 0.7–4.0)
MCH: 28.7 pg (ref 26.0–34.0)
MCHC: 30.5 g/dL (ref 30.0–36.0)
MCV: 94 fL (ref 80.0–100.0)
Monocytes Absolute: 0.4 10*3/uL (ref 0.1–1.0)
Monocytes Relative: 8 %
Neutro Abs: 2.9 10*3/uL (ref 1.7–7.7)
Neutrophils Relative %: 66 %
Platelet Count: 125 10*3/uL — ABNORMAL LOW (ref 150–400)
RBC: 3 MIL/uL — ABNORMAL LOW (ref 3.87–5.11)
RDW: 15 % (ref 11.5–15.5)
WBC Count: 4.4 10*3/uL (ref 4.0–10.5)
nRBC: 0 % (ref 0.0–0.2)

## 2019-10-12 LAB — CMP (CANCER CENTER ONLY)
ALT: 8 U/L (ref 0–44)
AST: 16 U/L (ref 15–41)
Albumin: 3 g/dL — ABNORMAL LOW (ref 3.5–5.0)
Alkaline Phosphatase: 74 U/L (ref 38–126)
Anion gap: 8 (ref 5–15)
BUN: 18 mg/dL (ref 8–23)
CO2: 26 mmol/L (ref 22–32)
Calcium: 9.8 mg/dL (ref 8.9–10.3)
Chloride: 111 mmol/L (ref 98–111)
Creatinine: 1.36 mg/dL — ABNORMAL HIGH (ref 0.44–1.00)
GFR, Est AFR Am: 41 mL/min — ABNORMAL LOW (ref >60)
GFR, Estimated: 35 mL/min — ABNORMAL LOW (ref >60)
Glucose, Bld: 92 mg/dL (ref 70–99)
Potassium: 3.6 mmol/L (ref 3.5–5.1)
Sodium: 145 mmol/L (ref 135–145)
Total Bilirubin: 0.3 mg/dL (ref 0.3–1.2)
Total Protein: 6.6 g/dL (ref 6.5–8.1)

## 2019-10-12 LAB — TSH: TSH: 4.821 u[IU]/mL — ABNORMAL HIGH (ref 0.308–3.960)

## 2019-10-12 MED ORDER — SODIUM CHLORIDE 0.9 % IV SOLN
Freq: Once | INTRAVENOUS | Status: AC
  Filled 2019-10-12: qty 250

## 2019-10-12 MED ORDER — HEPARIN SOD (PORK) LOCK FLUSH 100 UNIT/ML IV SOLN
500.0000 [IU] | Freq: Once | INTRAVENOUS | Status: AC | PRN
  Administered 2019-10-12: 500 [IU]
  Filled 2019-10-12: qty 5

## 2019-10-12 MED ORDER — SODIUM CHLORIDE 0.9 % IV SOLN
200.0000 mg | Freq: Once | INTRAVENOUS | Status: AC
  Administered 2019-10-12: 200 mg via INTRAVENOUS
  Filled 2019-10-12: qty 8

## 2019-10-12 MED ORDER — SODIUM CHLORIDE 0.9% FLUSH
10.0000 mL | INTRAVENOUS | Status: DC | PRN
  Administered 2019-10-12: 10 mL
  Filled 2019-10-12: qty 10

## 2019-10-12 MED ORDER — SODIUM CHLORIDE 0.9% FLUSH
10.0000 mL | Freq: Once | INTRAVENOUS | Status: AC
  Administered 2019-10-12: 10 mL
  Filled 2019-10-12: qty 10

## 2019-10-12 NOTE — Patient Instructions (Signed)
[facility] Discharge Instructions for Patients Receiving Chemotherapy  Today you received the following chemotherapy agents:  Keytruda.  To help prevent nausea and vomiting after your treatment, we encourage you to take your nausea medication as directed.   If you develop nausea and vomiting that is not controlled by your nausea medication, call the clinic.   BELOW ARE SYMPTOMS THAT SHOULD BE REPORTED IMMEDIATELY:  *FEVER GREATER THAN 100.5 F  *CHILLS WITH OR WITHOUT FEVER  NAUSEA AND VOMITING THAT IS NOT CONTROLLED WITH YOUR NAUSEA MEDICATION  *UNUSUAL SHORTNESS OF BREATH  *UNUSUAL BRUISING OR BLEEDING  TENDERNESS IN MOUTH AND THROAT WITH OR WITHOUT PRESENCE OF ULCERS  *URINARY PROBLEMS  *BOWEL PROBLEMS  UNUSUAL RASH Items with * indicate a potential emergency and should be followed up as soon as possible.  Feel free to call the clinic should you have any questions or concerns. The clinic phone number is [phone].  Please show the CHEMO ALERT CARD at check-in to the Emergency Department and triage nurse.    

## 2019-10-12 NOTE — Progress Notes (Signed)
[place] [phone]   [phone]  OFFICE PROGRESS NOTE  [name], MD [place] [address]  DIAGNOSIS: Recurrent non-small cell lung cancer initially diagnosed stage IIIB (T3, N3, M0) non-small cell lung cancer, squamous cell carcinoma presented with large right middle lobe lung mass in addition to right hilar, subcarinal and right supraclavicular lymphadenopathy diagnosed in August 2019.  The patient had evidence for disease progression in December 2020.  PRIOR THERAPY:  1) Concurrent chemoradiation with weekly carboplatin for AUC of 2 and paclitaxel 45 NG/M2.  Status post 6 cycles with partial response. 2) Consolidation immunotherapy with Imfinzi 10 mg/KG every 2 weeks started February 28, 2018.  Status post 26 cycles.  Last cycle was given on February 21, 2019  CURRENT THERAPY: Systemic chemotherapy with carboplatin for AUC of 5, paclitaxel 175 mg/M2 and Keytruda 200 mg IV every 3 weeks.  First dose April 03, 2018.  Status post 8 cycles.  INTERVAL HISTORY: Lisa [name] 86 y.o. female returns to the clinic today for follow-up visit.  The patient is feeling fine today with no concerning complaints except for the baseline fatigue and shortness of breath.  She denied having any chest pain but has mild cough with no hemoptysis.  She denied having any fever or chills.  She has no nausea, vomiting, diarrhea or constipation.  She denied having any headache or visual changes.  She is here today for evaluation before starting cycle #9 of her treatment.  MEDICAL HISTORY: Past Medical History:  Diagnosis Date  . Diverticulosis   . Headache   . Hemorrhoids    internal and external  . Hyperlipidemia 01/29/1991  . Hypertension 10/29/1995  . Hypothyroidism 10/29/1995  . Lung mass    right middle lobe  . Melanoma ([place])    h/o, local excision. no chemo or rady.   . Skin cancer (melanoma) ([place])   . Tubular adenoma of colon    . Wears dentures     ALLERGIES:  is allergic to atorvastatin, celebrex [celecoxib], cholecalciferol, simvastatin, and tape.  MEDICATIONS:  Current Outpatient Medications  Medication Sig Dispense Refill  . albuterol (VENTOLIN HFA) 108 (90 Base) MCG/ACT inhaler Inhale 2 puffs into the lungs every 6 (six) hours as needed for wheezing or shortness of breath. 6.7 g 0  . amLODipine (NORVASC) 10 MG tablet Take 1 tablet by mouth once daily 90 tablet 2  . Cholecalciferol (VITAMIN D3) 50 MCG (2000 UT) TABS Take 2,000 Units by mouth 3 (three) times a week.    . Coenzyme Q10 100 MG capsule Take 100 mg by mouth daily.     Marland Kitchen docusate sodium (COLACE) 100 MG capsule Take 100 mg by mouth daily as needed for mild constipation.    Arna Medici 75 MCG tablet Take 1 tablet by mouth once daily 90 tablet 0  . fluticasone (FLONASE) 50 MCG/ACT nasal spray USE 2 SPRAYS IN EACH NOSTRIL DAILY AS NEEDED FOR ALLERGIES 48 g 3  . levothyroxine (SYNTHROID) 75 MCG tablet Take 75 mcg by mouth daily before breakfast.    . loratadine (CLARITIN) 10 MG tablet Take 1 tablet (10 mg total) by mouth daily as needed for allergies or rhinitis.    . mirtazapine (REMERON) 7.5 MG tablet TAKE 1 TABLET BY MOUTH EVERY DAY AT BEDTIME 30 tablet 2  . Multiple Vitamin (MULTIVITAMIN WITH MINERALS) TABS tablet Take 1 tablet by mouth daily.    . sennosides-docusate sodium (SENOKOT-S) 8.6-50  MG tablet Take 1 tablet by mouth daily as needed for constipation.     No current facility-administered medications for this visit.   Facility-Administered Medications Ordered in Other Visits  Medication Dose Route Frequency Provider Last Rate Last Admin  . acetaminophen (TYLENOL) tablet 650 mg  650 mg Oral Once Nicholas Lose, MD        SURGICAL HISTORY:  Past Surgical History:  Procedure Laterality Date  . BRONCHIAL BIOPSY  11/23/2017   Procedure: BRONCHIAL BIOPSIES;  Surgeon: Grace Isaac, MD;  Location: [facility] OR;  Service: Thoracic;;  . cataract  surgery  2007, 2008   repair bilat  . DG BARIUM ENEMA ([place] [facility]) N/A 2016   [name]  . HERNIA REPAIR  04/25/2001  . HIP ARTHROPLASTY Left 05/31/2019   Procedure: LEFT HIP HEMIARTHROPLASTY;  Surgeon: Hiram Gash, MD;  Location: [facility];  Service: Orthopedics;  Laterality: Left;  . IR IMAGING GUIDED PORT INSERTION  07/05/2018  . IR US GUIDE BX ASP/DRAIN  11/17/2017  . KNEE SURGERY     meniscus tear per Dr. Ronnie Derby, R knee  . MULTIPLE TOOTH EXTRACTIONS    . SEPTOPLASTY  2006   Dr. Ernesto Rutherford  . SKIN CANCER EXCISION    . TONSILLECTOMY  1954  . VAGINAL HYSTERECTOMY    . VIDEO BRONCHOSCOPY WITH ENDOBRONCHIAL ULTRASOUND N/A 11/23/2017   Procedure: VIDEO BRONCHOSCOPY WITH ENDOBRONCHIAL ULTRASOUND;  Surgeon: Grace Isaac, MD;  Location: [facility];  Service: Thoracic;  Laterality: N/A;    REVIEW OF SYSTEMS:  A comprehensive review of systems was negative except for: Constitutional: positive for fatigue Respiratory: positive for cough and dyspnea on exertion   PHYSICAL EXAMINATION: General appearance: alert, cooperative, fatigued and no distress Head: Normocephalic, without obvious abnormality, atraumatic Neck: no adenopathy, no JVD, supple, symmetrical, trachea midline and thyroid not enlarged, symmetric, no tenderness/mass/nodules Lymph nodes: Cervical, supraclavicular, and axillary nodes normal. Resp: clear to auscultation bilaterally Back: symmetric, no curvature. ROM normal. No CVA tenderness. Cardio: regular rate and rhythm, S1, S2 normal, no murmur, click, rub or gallop GI: soft, non-tender; bowel sounds normal; no masses,  no organomegaly Extremities: extremities normal, atraumatic, no cyanosis or edema  ECOG PERFORMANCE STATUS: 1 - Symptomatic but completely ambulatory  Blood pressure (!) 142/85, pulse 87, temperature 97.9 F (36.6 C), temperature source Temporal, resp. rate 18, height 5\' 7"  (1.702 m), weight 142 lb 3.2 oz (64.5 kg), SpO2 100 %.  LABORATORY DATA: Lab Results    Component Value Date   WBC 4.4 10/12/2019   HGB 8.6 (L) 10/12/2019   HCT 28.2 (L) 10/12/2019   MCV 94.0 10/12/2019   PLT 125 (L) 10/12/2019      Chemistry      Component Value Date/Time   NA 143 09/20/2019 0900   K 3.8 09/20/2019 0900   CL 108 09/20/2019 0900   CO2 26 09/20/2019 0900   BUN 20 09/20/2019 0900   CREATININE 1.45 (H) 09/20/2019 0900      Component Value Date/Time   CALCIUM 9.7 09/20/2019 0900   ALKPHOS 80 09/20/2019 0900   AST 14 (L) 09/20/2019 0900   ALT 10 09/20/2019 0900   BILITOT 0.5 09/20/2019 0900       RADIOGRAPHIC STUDIES: No results found.  ASSESSMENT AND PLAN: This is a very pleasant 86 years old white female with stage IIIb non-small cell lung cancer, squamous cell carcinoma.  She is currently undergoing a course of concurrent chemoradiation with weekly carboplatin and paclitaxel status post 6 cycles.  She tolerated this treatment well with partial response. The patient also completed the course of consolidation treatment with immunotherapy with Imfinzi status post 26 cycles.  She tolerated her previous treatment well. Her recent CT scan of the chest showed progressive consolidation in the right lung suspicious for disease recurrence but also worsening post radiation effect could not be excluded.  A PET scan showed rounded hypermetabolic soft tissue mass inferior to the right hilum consistent with lung cancer recurrence.  There was also hypermetabolic precarinal lymph node concerning for nodal metastasis but no evidence for metastatic disease in the abdomen or pelvis.  The patient also had groundglass density in the right upper lobe, right middle lobe as well as left lower lobe suspicious for inflammatory process and less likely malignancy. The patient started treatment with systemic chemotherapy with carboplatin for AUC of 5, paclitaxel 175 mg/M2 and Keytruda 200 mg IV every 3 weeks with Neulasta support status post 8 cycles.  Starting from cycle #5 the  patient is currently on maintenance treatment with single agent Keytruda every 3 weeks.   The patient continues to tolerate her treatment with [facility] fairly well. I recommended for her to proceed with cycle #9 today as planned. I will see her back for follow-up visit in 3 weeks for evaluation with repeat CT scan of the chest for restaging of her disease. The patient was advised to call immediately if she has any concerning symptoms in the interval. The patient voices understanding of current disease status and treatment options and is in agreement with the current care plan. All questions were answered. The patient knows to call the clinic with any problems, questions or concerns. We can certainly see the patient much sooner if necessary.  Disclaimer: This note was dictated with voice recognition software. Similar sounding words can inadvertently be transcribed and may not be corrected upon review.

## 2019-10-31 NOTE — Progress Notes (Signed)
[name] OFFICE PROGRESS NOTE  [name], MD [place] [address]  DIAGNOSIS: Recurrent non-small cell lung cancer initially diagnosed stage IIIB (T3, N3, M0) non-small cell lung cancer, squamous cell carcinoma presented with large right middle lobe lung mass in addition to right hilar, subcarinal and right supraclavicular lymphadenopathy diagnosed in August 2019.The patient had evidence for disease progression in December 2020.  PRIOR THERAPY: 1) Concurrent chemoradiation with weekly carboplatin for AUC of 2 and paclitaxel 45 NG/M2. Status post 6 cycles with partial response. 2) Consolidation immunotherapy with Imfinzi 10 mg/KG every 2 weeks started February 28, 2018. Status post 26 cycles. Last cycle was given on February 21, 2019  CURRENT THERAPY: Systemic chemotherapy with carboplatin for an AUC of 5, paclitaxel 175 mg/m and Keytruda 200 mg IV every 3 weeks with Neulasta support. First dose on April 05, 2018.Status post9cycles.Starting from cycle #5 she will be on single agent maintenance Keytruda.  INTERVAL HISTORY: Lisa [name] 86 y.o. female returns to clinic today for a follow-up visit.  The patient is feeling well today without any concerning complaints. The patient continues to tolerate single agent Keytruda well without any concerning adverse side effects.  She denies any recent fever, chills, or night sweats. She recently met with a member of the nutritionist team regarding her weight loss and she is scheduled to see nutrition while in the infusion room today.  The patient's family make sure to keep a refrigerator stacked with supplemental drinks such as boost and Ensure for the patient.  She denies any chest pain, hemoptysis, or shortness of breath, or cough.  She states she sometimes feels short of breath with exertion but she does not exert herself often. She denies any nausea, vomiting, diarrhea, or constipation.  She denies  any headache or visual changes.  She denies any rashes or skin changes.  The patient was supposed to have a restaging CT scan performed but this was not scheduled at this time. The patient is here today for evaluation before starting cycle #10.  MEDICAL HISTORY: Past Medical History:  Diagnosis Date  . Diverticulosis   . Headache   . Hemorrhoids    internal and external  . Hyperlipidemia 01/29/1991  . Hypertension 10/29/1995  . Hypothyroidism 10/29/1995  . Lung mass    right middle lobe  . Melanoma ([place])    h/o, local excision. no chemo or rady.   . Skin cancer (melanoma) ([place])   . Tubular adenoma of colon   . Wears dentures     ALLERGIES:  is allergic to atorvastatin, celebrex [celecoxib], cholecalciferol, simvastatin, and tape.  MEDICATIONS:  Current Outpatient Medications  Medication Sig Dispense Refill  . albuterol (VENTOLIN HFA) 108 (90 Base) MCG/ACT inhaler Inhale 2 puffs into the lungs every 6 (six) hours as needed for wheezing or shortness of breath. 6.7 g 0  . amLODipine (NORVASC) 10 MG tablet Take 1 tablet by mouth once daily 90 tablet 2  . Cholecalciferol (VITAMIN D3) 50 MCG (2000 UT) TABS Take 2,000 Units by mouth 3 (three) times a week.    . Coenzyme Q10 100 MG capsule Take 100 mg by mouth daily.     Marland Kitchen docusate sodium (COLACE) 100 MG capsule Take 100 mg by mouth daily as needed for mild constipation.    Arna Medici 75 MCG tablet Take 1 tablet by mouth once daily 90 tablet 0  . fluticasone (FLONASE) 50 MCG/ACT nasal spray USE 2 SPRAYS IN EACH NOSTRIL  DAILY AS NEEDED FOR ALLERGIES 48 g 3  . levothyroxine (SYNTHROID) 75 MCG tablet Take 75 mcg by mouth daily before breakfast.    . loratadine (CLARITIN) 10 MG tablet Take 1 tablet (10 mg total) by mouth daily as needed for allergies or rhinitis.    . mirtazapine (REMERON) 7.5 MG tablet TAKE 1 TABLET BY MOUTH EVERY DAY AT BEDTIME 30 tablet 2  . Multiple Vitamin (MULTIVITAMIN WITH MINERALS) TABS tablet Take 1 tablet by mouth  daily.    . sennosides-docusate sodium (SENOKOT-S) 8.6-50 MG tablet Take 1 tablet by mouth daily as needed for constipation.     No current facility-administered medications for this visit.   Facility-Administered Medications Ordered in Other Visits  Medication Dose Route Frequency Provider Last Rate Last Admin  . acetaminophen (TYLENOL) tablet 650 mg  650 mg Oral Once Nicholas Lose, MD      . heparin lock flush 100 unit/mL  500 Units Intracatheter Once PRN Curt Bears, MD      . pembrolizumab [facility]) 200 mg in sodium chloride 0.9 % 50 mL chemo infusion  200 mg Intravenous Once Curt Bears, MD      . sodium chloride flush (NS) 0.9 % injection 10 mL  10 mL Intracatheter PRN Curt Bears, MD        SURGICAL HISTORY:  Past Surgical History:  Procedure Laterality Date  . BRONCHIAL BIOPSY  11/23/2017   Procedure: BRONCHIAL BIOPSIES;  Surgeon: Grace Isaac, MD;  Location: [facility];  Service: Thoracic;;  . cataract surgery  2007, 2008   repair bilat  . DG BARIUM ENEMA ([place] HX) N/A 2016   [name]  . HERNIA REPAIR  04/25/2001  . HIP ARTHROPLASTY Left 05/31/2019   Procedure: LEFT HIP HEMIARTHROPLASTY;  Surgeon: Hiram Gash, MD;  Location: [facility];  Service: Orthopedics;  Laterality: Left;  . IR IMAGING GUIDED PORT INSERTION  07/05/2018  . IR US GUIDE BX ASP/DRAIN  11/17/2017  . KNEE SURGERY     meniscus tear per Dr. Ronnie Derby, R knee  . MULTIPLE TOOTH EXTRACTIONS    . SEPTOPLASTY  2006   Dr. Ernesto Rutherford  . SKIN CANCER EXCISION    . TONSILLECTOMY  1954  . VAGINAL HYSTERECTOMY    . VIDEO BRONCHOSCOPY WITH ENDOBRONCHIAL ULTRASOUND N/A 11/23/2017   Procedure: VIDEO BRONCHOSCOPY WITH ENDOBRONCHIAL ULTRASOUND;  Surgeon: Grace Isaac, MD;  Location: [facility];  Service: Thoracic;  Laterality: N/A;    REVIEW OF SYSTEMS:   Review of Systems  Constitutional: Negative for appetite change, chills, fatigue, fever and unexpected weight change.  HENT: Negative for mouth sores,  nosebleeds, sore throat and trouble swallowing.   Eyes: Negative for eye problems and icterus.  Respiratory: Negative for cough, hemoptysis, shortness of breath and wheezing.   Cardiovascular:  Positive for bilateral baseline lower extremity swelling. Negative for chest pain.  Gastrointestinal: Negative for abdominal pain, constipation, diarrhea, nausea and vomiting.  Genitourinary: Negative for bladder incontinence, difficulty urinating, dysuria, frequency and hematuria.   Musculoskeletal: Negative for back pain, gait problem, neck pain and neck stiffness.  Skin: Negative for itching and rash.  Neurological: Negative for dizziness, extremity weakness, gait problem, headaches, light-headedness and seizures.  Hematological: Negative for adenopathy. Does not bruise/bleed easily.  Psychiatric/Behavioral: Positive for some dementia. Negative for confusion, depression and sleep disturbance. The patient is not nervous/anxious.     PHYSICAL EXAMINATION:  Blood pressure (!) 116/58, pulse 85, temperature 97.7 F (36.5 C), temperature source Temporal, resp. rate 17, height '5\' 7"'  (  1.702 m), weight 141 lb 4.8 oz (64.1 kg), SpO2 100 %.  ECOG PERFORMANCE STATUS: 1 - Symptomatic but completely ambulatory  Physical Exam  Constitutional: Oriented to person, place, and time and elderly appearing female and in no distress. No distress.  HENT:  Head: Normocephalic and atraumatic.  Mouth/Throat: Oropharynx is clear and moist. No oropharyngeal exudate.  Eyes: Conjunctivae are normal. Right eye exhibits no discharge. Left eye exhibits no discharge. No scleral icterus.  Neck: Normal range of motion. Neck supple.  Cardiovascular: Normal rate, regular rhythm, normal heart sounds and intact distal pulses.   Pulmonary/Chest: Effort normal and breath sounds normal. No respiratory distress. No wheezes. No rales.  Abdominal: Soft. Bowel sounds are normal. Exhibits no distension and no mass. There is no tenderness.   Musculoskeletal: Bilateral lower extremity swelling. Normal range of motion.   Lymphadenopathy:    No cervical adenopathy.  Neurological: Alert and oriented to person, place, and time. Exhibits normal muscle tone. The patient was examined in the wheel chair.  Skin: Skin is warm and dry. No rash noted. Not diaphoretic. No erythema. No pallor.  Psychiatric: Mood and judgment normal. Has some memory deficits. Vitals reviewed.  LABORATORY DATA: Lab Results  Component Value Date   WBC 4.7 11/02/2019   HGB 8.6 (L) 11/02/2019   HCT 27.7 (L) 11/02/2019   MCV 91.1 11/02/2019   PLT 124 (L) 11/02/2019      Chemistry      Component Value Date/Time   NA 143 11/02/2019 0943   K 3.5 11/02/2019 0943   CL 109 11/02/2019 0943   CO2 26 11/02/2019 0943   BUN 23 11/02/2019 0943   CREATININE 1.50 (H) 11/02/2019 0943      Component Value Date/Time   CALCIUM 10.1 11/02/2019 0943   ALKPHOS 66 11/02/2019 0943   AST 15 11/02/2019 0943   ALT 6 11/02/2019 0943   BILITOT 0.4 11/02/2019 0943       RADIOGRAPHIC STUDIES:  No results found.   ASSESSMENT/PLAN:  This is a very pleasant 86 year old Caucasian female withrecurrent non-small cell lung cancer initially diagnosed stage IIIB (T3, N3, M0) non-small cell lung cancer, squamous cell carcinoma presented with large right middle lobe lung mass in addition to right hilar, subcarinal and right supraclavicular lymphadenopathy diagnosed in August 2019.The patient had evidence for disease progression in December 2020.  She completed a course of concurrent chemoradiation with carboplatin for an AUC of 2 and paclitaxel 5 mg/m. She is status post 6 cycles with a partial response.  She then completed consolidation immunotherapy with Imfinzi 10 mg/kg IV every 2 weeks. She is status post 26 cycles. She showed evidence of disease progression on her restaging CT scan.  The patient is currently undergoing treatment with carboplatin for an AUC of 5,  paclitaxel 175 mg/m, Keytruda 200 mg IV every 3 weeks with Neulasta support.She is status post9cycles. She was hospitalized for a left hip fracture in March 2021. Her treatment was on hold while she was recovering. Starting from cycle #5, the patient started maintenance treatment with single agent immunotherapy with Keytruda 200 mg IV every 3 weeks.  Labs were reviewed. Recommend that she proceed with cycle #10 today scheduled.   The patient was supposed to have a restaging CT scan performed. I have scheduled the patient's CT scan for her. Due to availability issues at [facility], I have scheduled her scan at [facility]. She was given written instructions for the address and appointment time. I also spoke  to [name], the patient's granddaughter. Discussed with Dr. Julien Nordmann who wanted to include the abdomen/pelvis as part of her imaging to rule out abdominal metastasis.   We will see her back for follow-up visit in 3 weeks for evaluation before starting cycle #11.  Her TSH is pending. We will make dose adjustments if necessary. She currently is taking Euthyrox.   The patient was advised to call immediately if she has any concerning symptoms in the interval. The patient voices understanding of current disease status and treatment options and is in agreement with the current care plan. All questions were answered. The patient knows to call the clinic with any problems, questions or concerns. We can certainly see the patient much sooner if necessary       No orders of the defined types were placed in this encounter.    Anas Reister L Anabia Weatherwax, PA-C 11/02/19

## 2019-11-02 ENCOUNTER — Other Ambulatory Visit: Payer: Self-pay

## 2019-11-02 ENCOUNTER — Inpatient Hospital Stay: Payer: Medicare Other

## 2019-11-02 ENCOUNTER — Inpatient Hospital Stay: Payer: Medicare Other | Attending: Internal Medicine | Admitting: Physician Assistant

## 2019-11-02 ENCOUNTER — Inpatient Hospital Stay: Payer: Medicare Other | Admitting: Nutrition

## 2019-11-02 VITALS — BP 116/58 | HR 85 | Temp 97.7°F | Resp 17 | Ht 67.0 in | Wt 141.3 lb

## 2019-11-02 DIAGNOSIS — E785 Hyperlipidemia, unspecified: Secondary | ICD-10-CM | POA: Diagnosis not present

## 2019-11-02 DIAGNOSIS — Z79899 Other long term (current) drug therapy: Secondary | ICD-10-CM | POA: Diagnosis not present

## 2019-11-02 DIAGNOSIS — I10 Essential (primary) hypertension: Secondary | ICD-10-CM | POA: Insufficient documentation

## 2019-11-02 DIAGNOSIS — R5383 Other fatigue: Secondary | ICD-10-CM | POA: Diagnosis not present

## 2019-11-02 DIAGNOSIS — R600 Localized edema: Secondary | ICD-10-CM | POA: Diagnosis not present

## 2019-11-02 DIAGNOSIS — Z95828 Presence of other vascular implants and grafts: Secondary | ICD-10-CM

## 2019-11-02 DIAGNOSIS — E039 Hypothyroidism, unspecified: Secondary | ICD-10-CM | POA: Insufficient documentation

## 2019-11-02 DIAGNOSIS — Z9221 Personal history of antineoplastic chemotherapy: Secondary | ICD-10-CM | POA: Diagnosis not present

## 2019-11-02 DIAGNOSIS — C342 Malignant neoplasm of middle lobe, bronchus or lung: Secondary | ICD-10-CM | POA: Diagnosis not present

## 2019-11-02 DIAGNOSIS — C3491 Malignant neoplasm of unspecified part of right bronchus or lung: Secondary | ICD-10-CM

## 2019-11-02 DIAGNOSIS — Z923 Personal history of irradiation: Secondary | ICD-10-CM | POA: Diagnosis not present

## 2019-11-02 DIAGNOSIS — Z5112 Encounter for antineoplastic immunotherapy: Secondary | ICD-10-CM | POA: Diagnosis not present

## 2019-11-02 DIAGNOSIS — Z85820 Personal history of malignant melanoma of skin: Secondary | ICD-10-CM | POA: Diagnosis not present

## 2019-11-02 LAB — CBC WITH DIFFERENTIAL (CANCER CENTER ONLY)
Abs Immature Granulocytes: 0.01 10*3/uL (ref 0.00–0.07)
Basophils Absolute: 0 10*3/uL (ref 0.0–0.1)
Basophils Relative: 1 %
Eosinophils Absolute: 0.1 10*3/uL (ref 0.0–0.5)
Eosinophils Relative: 2 %
HCT: 27.7 % — ABNORMAL LOW (ref 36.0–46.0)
Hemoglobin: 8.6 g/dL — ABNORMAL LOW (ref 12.0–15.0)
Immature Granulocytes: 0 %
Lymphocytes Relative: 19 %
Lymphs Abs: 0.9 10*3/uL (ref 0.7–4.0)
MCH: 28.3 pg (ref 26.0–34.0)
MCHC: 31 g/dL (ref 30.0–36.0)
MCV: 91.1 fL (ref 80.0–100.0)
Monocytes Absolute: 0.4 10*3/uL (ref 0.1–1.0)
Monocytes Relative: 8 %
Neutro Abs: 3.4 10*3/uL (ref 1.7–7.7)
Neutrophils Relative %: 70 %
Platelet Count: 124 10*3/uL — ABNORMAL LOW (ref 150–400)
RBC: 3.04 MIL/uL — ABNORMAL LOW (ref 3.87–5.11)
RDW: 15 % (ref 11.5–15.5)
WBC Count: 4.7 10*3/uL (ref 4.0–10.5)
nRBC: 0 % (ref 0.0–0.2)

## 2019-11-02 LAB — CMP (CANCER CENTER ONLY)
ALT: 6 U/L (ref 0–44)
AST: 15 U/L (ref 15–41)
Albumin: 3.1 g/dL — ABNORMAL LOW (ref 3.5–5.0)
Alkaline Phosphatase: 66 U/L (ref 38–126)
Anion gap: 8 (ref 5–15)
BUN: 23 mg/dL (ref 8–23)
CO2: 26 mmol/L (ref 22–32)
Calcium: 10.1 mg/dL (ref 8.9–10.3)
Chloride: 109 mmol/L (ref 98–111)
Creatinine: 1.5 mg/dL — ABNORMAL HIGH (ref 0.44–1.00)
GFR, Est AFR Am: 36 mL/min — ABNORMAL LOW (ref >60)
GFR, Estimated: 31 mL/min — ABNORMAL LOW (ref >60)
Glucose, Bld: 85 mg/dL (ref 70–99)
Potassium: 3.5 mmol/L (ref 3.5–5.1)
Sodium: 143 mmol/L (ref 135–145)
Total Bilirubin: 0.4 mg/dL (ref 0.3–1.2)
Total Protein: 6.6 g/dL (ref 6.5–8.1)

## 2019-11-02 LAB — TSH: TSH: 4.429 u[IU]/mL — ABNORMAL HIGH (ref 0.308–3.960)

## 2019-11-02 MED ORDER — SODIUM CHLORIDE 0.9% FLUSH
10.0000 mL | Freq: Once | INTRAVENOUS | Status: AC
  Administered 2019-11-02: 10 mL
  Filled 2019-11-02: qty 10

## 2019-11-02 MED ORDER — HEPARIN SOD (PORK) LOCK FLUSH 100 UNIT/ML IV SOLN
500.0000 [IU] | Freq: Once | INTRAVENOUS | Status: AC | PRN
  Administered 2019-11-02: 500 [IU]
  Filled 2019-11-02: qty 5

## 2019-11-02 MED ORDER — SODIUM CHLORIDE 0.9 % IV SOLN
200.0000 mg | Freq: Once | INTRAVENOUS | Status: AC
  Administered 2019-11-02: 200 mg via INTRAVENOUS
  Filled 2019-11-02: qty 8

## 2019-11-02 MED ORDER — SODIUM CHLORIDE 0.9 % IV SOLN
Freq: Once | INTRAVENOUS | Status: AC
  Filled 2019-11-02: qty 250

## 2019-11-02 MED ORDER — SODIUM CHLORIDE 0.9% FLUSH
10.0000 mL | INTRAVENOUS | Status: DC | PRN
  Administered 2019-11-02: 10 mL
  Filled 2019-11-02: qty 10

## 2019-11-02 NOTE — Patient Instructions (Addendum)
-  I have scheduled your CT scan for you. I hope this is ok. If it does not work for you please call this number to reschedule [phone]. Due to staffing issues and availability, they were unable to schedule the scan at [facility] in the time frame that we would like. I have scheduled this at [facility]. The address is [address], [place], [place] [address]. The building is called [facility]. -Please nothing to eat or drink 4 hours before. Please arrive at 10:45 AM on 11/07/19. Please remember to drink the contrast before the scan. The first bottle start 2 hours before the scan and drink the second bottle 1 hour before the scan.

## 2019-11-02 NOTE — Progress Notes (Signed)
Nutrition follow-up completed with patient during infusion for recurrent non-small cell lung cancer. Weight was documented as 141.3 pounds on August 5 stable from 140 pounds June 23. Patient denies nausea, vomiting, constipation, and diarrhea. Reports she generally does not eat breakfast because she gets up later. Reports she drinks a bottle of Ensure prior to appointments because it is quick. She consumes some water but mostly regular tea and Coca-Cola.  Nutrition diagnosis: Unintended weight loss has stabilized.  Intervention: I educated patient on the importance of smaller more frequent meals and snacks throughout the day. Encourage patient to increase water consumption. Recommended patient try Ensure plus or equivalent 1-2 cartons daily.  Suggested that this would be a good afternoon snack.  Monitoring, evaluation, goals: Patient will tolerate adequate calories and protein to minimize weight loss.  Next visit: To be scheduled with upcoming treatment as needed.  **Disclaimer: This note was dictated with voice recognition software. Similar sounding words can inadvertently be transcribed and this note may contain transcription errors which may not have been corrected upon publication of note.**

## 2019-11-02 NOTE — Patient Instructions (Signed)
Lisa [name] Discharge Instructions for Patients Receiving Chemotherapy  Today you received the following chemotherapy agents Pembrolizumab [facility]).  To help prevent nausea and vomiting after your treatment, we encourage you to take your nausea medication as prescribed.   If you develop nausea and vomiting that is not controlled by your nausea medication, call the clinic.   BELOW ARE SYMPTOMS THAT SHOULD BE REPORTED IMMEDIATELY:  *FEVER GREATER THAN 100.5 F  *CHILLS WITH OR WITHOUT FEVER  NAUSEA AND VOMITING THAT IS NOT CONTROLLED WITH YOUR NAUSEA MEDICATION  *UNUSUAL SHORTNESS OF BREATH  *UNUSUAL BRUISING OR BLEEDING  TENDERNESS IN MOUTH AND THROAT WITH OR WITHOUT PRESENCE OF ULCERS  *URINARY PROBLEMS  *BOWEL PROBLEMS  UNUSUAL RASH Items with * indicate a potential emergency and should be followed up as soon as possible.  Feel free to call the clinic should you have any questions or concerns. The clinic phone number is [phone].  Please show the [place] at check-in to the Emergency Department and triage nurse.

## 2019-11-04 ENCOUNTER — Other Ambulatory Visit: Payer: Self-pay | Admitting: Physician Assistant

## 2019-11-04 ENCOUNTER — Other Ambulatory Visit: Payer: Self-pay | Admitting: Family Medicine

## 2019-11-04 DIAGNOSIS — R630 Anorexia: Secondary | ICD-10-CM

## 2019-11-04 DIAGNOSIS — C3491 Malignant neoplasm of unspecified part of right bronchus or lung: Secondary | ICD-10-CM

## 2019-11-07 ENCOUNTER — Ambulatory Visit (HOSPITAL_BASED_OUTPATIENT_CLINIC_OR_DEPARTMENT_OTHER)
Admission: RE | Admit: 2019-11-07 | Discharge: 2019-11-07 | Disposition: A | Discharge status: Home / Self Care | Payer: Medicare Other | Source: Ambulatory Visit | Attending: Internal Medicine | Admitting: Internal Medicine

## 2019-11-07 ENCOUNTER — Other Ambulatory Visit: Payer: Self-pay

## 2019-11-07 DIAGNOSIS — C3490 Malignant neoplasm of unspecified part of unspecified bronchus or lung: Secondary | ICD-10-CM | POA: Diagnosis not present

## 2019-11-07 DIAGNOSIS — C189 Malignant neoplasm of colon, unspecified: Secondary | ICD-10-CM | POA: Diagnosis not present

## 2019-11-07 MED ORDER — IOHEXOL 300 MG/ML  SOLN
100.0000 mL | Freq: Once | INTRAMUSCULAR | Status: AC | PRN
  Administered 2019-11-07: 80 mL via INTRAVENOUS

## 2019-11-12 DIAGNOSIS — M6281 Muscle weakness (generalized): Secondary | ICD-10-CM | POA: Diagnosis not present

## 2019-11-12 DIAGNOSIS — R2681 Unsteadiness on feet: Secondary | ICD-10-CM | POA: Diagnosis not present

## 2019-11-12 DIAGNOSIS — S72002D Fracture of unspecified part of neck of left femur, subsequent encounter for closed fracture with routine healing: Secondary | ICD-10-CM | POA: Diagnosis not present

## 2019-11-12 DIAGNOSIS — I82409 Acute embolism and thrombosis of unspecified deep veins of unspecified lower extremity: Secondary | ICD-10-CM | POA: Diagnosis not present

## 2019-11-23 ENCOUNTER — Inpatient Hospital Stay: Payer: Medicare Other

## 2019-11-23 ENCOUNTER — Other Ambulatory Visit: Payer: Self-pay

## 2019-11-23 ENCOUNTER — Inpatient Hospital Stay (HOSPITAL_BASED_OUTPATIENT_CLINIC_OR_DEPARTMENT_OTHER): Payer: Medicare Other | Admitting: Internal Medicine

## 2019-11-23 ENCOUNTER — Encounter: Payer: Self-pay | Admitting: Internal Medicine

## 2019-11-23 ENCOUNTER — Ambulatory Visit: Payer: Medicare Other | Admitting: Nutrition

## 2019-11-23 VITALS — BP 149/89 | HR 80 | Temp 97.1°F | Resp 16 | Ht 67.0 in | Wt 138.8 lb

## 2019-11-23 DIAGNOSIS — E039 Hypothyroidism, unspecified: Secondary | ICD-10-CM

## 2019-11-23 DIAGNOSIS — Z5112 Encounter for antineoplastic immunotherapy: Secondary | ICD-10-CM | POA: Diagnosis not present

## 2019-11-23 DIAGNOSIS — C3491 Malignant neoplasm of unspecified part of right bronchus or lung: Secondary | ICD-10-CM | POA: Diagnosis not present

## 2019-11-23 DIAGNOSIS — E785 Hyperlipidemia, unspecified: Secondary | ICD-10-CM | POA: Diagnosis not present

## 2019-11-23 DIAGNOSIS — C342 Malignant neoplasm of middle lobe, bronchus or lung: Secondary | ICD-10-CM | POA: Diagnosis not present

## 2019-11-23 DIAGNOSIS — Z9221 Personal history of antineoplastic chemotherapy: Secondary | ICD-10-CM | POA: Diagnosis not present

## 2019-11-23 DIAGNOSIS — Z79899 Other long term (current) drug therapy: Secondary | ICD-10-CM | POA: Diagnosis not present

## 2019-11-23 DIAGNOSIS — Z85820 Personal history of malignant melanoma of skin: Secondary | ICD-10-CM | POA: Diagnosis not present

## 2019-11-23 DIAGNOSIS — Z95828 Presence of other vascular implants and grafts: Secondary | ICD-10-CM

## 2019-11-23 DIAGNOSIS — I10 Essential (primary) hypertension: Secondary | ICD-10-CM | POA: Diagnosis not present

## 2019-11-23 DIAGNOSIS — Z923 Personal history of irradiation: Secondary | ICD-10-CM | POA: Diagnosis not present

## 2019-11-23 DIAGNOSIS — R5383 Other fatigue: Secondary | ICD-10-CM | POA: Diagnosis not present

## 2019-11-23 DIAGNOSIS — R600 Localized edema: Secondary | ICD-10-CM | POA: Diagnosis not present

## 2019-11-23 LAB — CBC WITH DIFFERENTIAL (CANCER CENTER ONLY)
Abs Immature Granulocytes: 0.02 10*3/uL (ref 0.00–0.07)
Basophils Absolute: 0 10*3/uL (ref 0.0–0.1)
Basophils Relative: 0 %
Eosinophils Absolute: 0.1 10*3/uL (ref 0.0–0.5)
Eosinophils Relative: 1 %
HCT: 28.5 % — ABNORMAL LOW (ref 36.0–46.0)
Hemoglobin: 8.5 g/dL — ABNORMAL LOW (ref 12.0–15.0)
Immature Granulocytes: 0 %
Lymphocytes Relative: 15 %
Lymphs Abs: 0.7 10*3/uL (ref 0.7–4.0)
MCH: 27.2 pg (ref 26.0–34.0)
MCHC: 29.8 g/dL — ABNORMAL LOW (ref 30.0–36.0)
MCV: 91.3 fL (ref 80.0–100.0)
Monocytes Absolute: 0.4 10*3/uL (ref 0.1–1.0)
Monocytes Relative: 7 %
Neutro Abs: 3.8 10*3/uL (ref 1.7–7.7)
Neutrophils Relative %: 77 %
Platelet Count: 132 10*3/uL — ABNORMAL LOW (ref 150–400)
RBC: 3.12 MIL/uL — ABNORMAL LOW (ref 3.87–5.11)
RDW: 15.4 % (ref 11.5–15.5)
WBC Count: 5 10*3/uL (ref 4.0–10.5)
nRBC: 0 % (ref 0.0–0.2)

## 2019-11-23 LAB — CMP (CANCER CENTER ONLY)
ALT: <6 U/L (ref 0–44)
AST: 16 U/L (ref 15–41)
Albumin: 3 g/dL — ABNORMAL LOW (ref 3.5–5.0)
Alkaline Phosphatase: 68 U/L (ref 38–126)
Anion gap: 4 — ABNORMAL LOW (ref 5–15)
BUN: 27 mg/dL — ABNORMAL HIGH (ref 8–23)
CO2: 29 mmol/L (ref 22–32)
Calcium: 10.1 mg/dL (ref 8.9–10.3)
Chloride: 107 mmol/L (ref 98–111)
Creatinine: 1.47 mg/dL — ABNORMAL HIGH (ref 0.44–1.00)
GFR, Est AFR Am: 37 mL/min — ABNORMAL LOW (ref >60)
GFR, Estimated: 32 mL/min — ABNORMAL LOW (ref >60)
Glucose, Bld: 93 mg/dL (ref 70–99)
Potassium: 3.6 mmol/L (ref 3.5–5.1)
Sodium: 140 mmol/L (ref 135–145)
Total Bilirubin: 0.4 mg/dL (ref 0.3–1.2)
Total Protein: 6.7 g/dL (ref 6.5–8.1)

## 2019-11-23 LAB — TSH: TSH: 4.282 u[IU]/mL — ABNORMAL HIGH (ref 0.308–3.960)

## 2019-11-23 MED ORDER — SODIUM CHLORIDE 0.9 % IV SOLN
200.0000 mg | Freq: Once | INTRAVENOUS | Status: AC
  Administered 2019-11-23: 200 mg via INTRAVENOUS
  Filled 2019-11-23: qty 8

## 2019-11-23 MED ORDER — SODIUM CHLORIDE 0.9% FLUSH
10.0000 mL | INTRAVENOUS | Status: DC | PRN
  Administered 2019-11-23: 10 mL
  Filled 2019-11-23: qty 10

## 2019-11-23 MED ORDER — SODIUM CHLORIDE 0.9 % IV SOLN
Freq: Once | INTRAVENOUS | Status: AC
  Filled 2019-11-23: qty 250

## 2019-11-23 MED ORDER — HEPARIN SOD (PORK) LOCK FLUSH 100 UNIT/ML IV SOLN
500.0000 [IU] | Freq: Once | INTRAVENOUS | Status: AC | PRN
  Administered 2019-11-23: 500 [IU]
  Filled 2019-11-23: qty 5

## 2019-11-23 MED ORDER — SODIUM CHLORIDE 0.9% FLUSH
10.0000 mL | Freq: Once | INTRAVENOUS | Status: AC
  Administered 2019-11-23: 10 mL
  Filled 2019-11-23: qty 10

## 2019-11-23 NOTE — Progress Notes (Signed)
[facility] [phone]   [phone]  OFFICE PROGRESS NOTE  [name], MD [place] [address]  DIAGNOSIS: Recurrent non-small cell lung cancer initially diagnosed stage IIIB (T3, N3, M0) non-small cell lung cancer, squamous cell carcinoma presented with large right middle lobe lung mass in addition to right hilar, subcarinal and right supraclavicular lymphadenopathy diagnosed in August 2019.  The patient had evidence for disease progression in December 2020.  PRIOR THERAPY:  1) Concurrent chemoradiation with weekly carboplatin for AUC of 2 and paclitaxel 45 NG/M2.  Status post 6 cycles with partial response. 2) Consolidation immunotherapy with Imfinzi 10 mg/KG every 2 weeks started February 28, 2018.  Status post 26 cycles.  Last cycle was given on February 21, 2019  CURRENT THERAPY: Systemic chemotherapy with carboplatin for AUC of 5, paclitaxel 175 mg/M2 and Keytruda 200 mg IV every 3 weeks.  First dose April 03, 2018.  Status post 10 cycles.  INTERVAL HISTORY: Lisa [name] 86 y.o. female returns to the clinic today for follow-up visit.  The patient is feeling fine today with no concerning complaints except for the baseline fatigue and shortness of breath with exertion.  She denied having any chest pain, cough or hemoptysis.  She denied having any recent weight loss or night sweats.  She has no nausea, vomiting, diarrhea or constipation.  She denied having any headache or visual changes.  She is here today for evaluation before starting cycle #11 of her treatment.  MEDICAL HISTORY: Past Medical History:  Diagnosis Date  . Diverticulosis   . Headache   . Hemorrhoids    internal and external  . Hyperlipidemia 01/29/1991  . Hypertension 10/29/1995  . Hypothyroidism 10/29/1995  . Lung mass    right middle lobe  . Melanoma ([facility])    h/o, local excision. no chemo or rady.   . Skin cancer (melanoma) ([facility])   .  Tubular adenoma of colon   . Wears dentures     ALLERGIES:  is allergic to atorvastatin, celebrex [celecoxib], cholecalciferol, simvastatin, and tape.  MEDICATIONS:  Current Outpatient Medications  Medication Sig Dispense Refill  . albuterol (VENTOLIN HFA) 108 (90 Base) MCG/ACT inhaler Inhale 2 puffs into the lungs every 6 (six) hours as needed for wheezing or shortness of breath. 6.7 g 0  . amLODipine (NORVASC) 10 MG tablet Take 1 tablet by mouth once daily 90 tablet 0  . Cholecalciferol (VITAMIN D3) 50 MCG (2000 UT) TABS Take 2,000 Units by mouth 3 (three) times a week.    . Coenzyme Q10 100 MG capsule Take 100 mg by mouth daily.     Marland Kitchen docusate sodium (COLACE) 100 MG capsule Take 100 mg by mouth daily as needed for mild constipation.    . fluticasone (FLONASE) 50 MCG/ACT nasal spray USE 2 SPRAYS IN EACH NOSTRIL DAILY AS NEEDED FOR ALLERGIES 48 g 3  . levothyroxine (SYNTHROID) 75 MCG tablet Take 75 mcg by mouth daily before breakfast.    . levothyroxine (SYNTHROID) 75 MCG tablet Take 1 tablet by mouth once daily 90 tablet 0  . loratadine (CLARITIN) 10 MG tablet Take 1 tablet (10 mg total) by mouth daily as needed for allergies or rhinitis.    . mirtazapine (REMERON) 7.5 MG tablet TAKE 1 TABLET BY MOUTH ONCE DAILY AT BEDTIME 30 tablet 0  . Multiple Vitamin (MULTIVITAMIN WITH MINERALS) TABS tablet Take 1 tablet by mouth daily.    . sennosides-docusate sodium (  SENOKOT-S) 8.6-50 MG tablet Take 1 tablet by mouth daily as needed for constipation.     No current facility-administered medications for this visit.   Facility-Administered Medications Ordered in Other Visits  Medication Dose Route Frequency Provider Last Rate Last Admin  . acetaminophen (TYLENOL) tablet 650 mg  650 mg Oral Once Nicholas Lose, MD        SURGICAL HISTORY:  Past Surgical History:  Procedure Laterality Date  . BRONCHIAL BIOPSY  11/23/2017   Procedure: BRONCHIAL BIOPSIES;  Surgeon: Grace Isaac, MD;  Location:  [facility] OR;  Service: Thoracic;;  . cataract surgery  2007, 2008   repair bilat  . DG BARIUM ENEMA ([place] [facility]) N/A 2016   [name]  . HERNIA REPAIR  04/25/2001  . HIP ARTHROPLASTY Left 05/31/2019   Procedure: LEFT HIP HEMIARTHROPLASTY;  Surgeon: Hiram Gash, MD;  Location: [facility];  Service: Orthopedics;  Laterality: Left;  . IR IMAGING GUIDED PORT INSERTION  07/05/2018  . IR US GUIDE BX ASP/DRAIN  11/17/2017  . KNEE SURGERY     meniscus tear per Dr. Ronnie Derby, R knee  . MULTIPLE TOOTH EXTRACTIONS    . SEPTOPLASTY  2006   Dr. Ernesto Rutherford  . SKIN CANCER EXCISION    . TONSILLECTOMY  1954  . VAGINAL HYSTERECTOMY    . VIDEO BRONCHOSCOPY WITH ENDOBRONCHIAL ULTRASOUND N/A 11/23/2017   Procedure: VIDEO BRONCHOSCOPY WITH ENDOBRONCHIAL ULTRASOUND;  Surgeon: Grace Isaac, MD;  Location: [facility];  Service: Thoracic;  Laterality: N/A;    REVIEW OF SYSTEMS:  A comprehensive review of systems was negative except for: Constitutional: positive for fatigue Respiratory: positive for dyspnea on exertion   PHYSICAL EXAMINATION: General appearance: alert, cooperative, fatigued and no distress Head: Normocephalic, without obvious abnormality, atraumatic Neck: no adenopathy, no JVD, supple, symmetrical, trachea midline and thyroid not enlarged, symmetric, no tenderness/mass/nodules Lymph nodes: Cervical, supraclavicular, and axillary nodes normal. Resp: clear to auscultation bilaterally Back: symmetric, no curvature. ROM normal. No CVA tenderness. Cardio: regular rate and rhythm, S1, S2 normal, no murmur, click, rub or gallop GI: soft, non-tender; bowel sounds normal; no masses,  no organomegaly Extremities: extremities normal, atraumatic, no cyanosis or edema  ECOG PERFORMANCE STATUS: 1 - Symptomatic but completely ambulatory  Blood pressure (!) 149/89, pulse 80, temperature (!) 97.1 F (36.2 C), temperature source Tympanic, resp. rate 16, height 5\' 7"  (1.702 m), weight 138 lb 12.8 oz (63 kg), SpO2 98  %.  LABORATORY DATA: Lab Results  Component Value Date   WBC 5.0 11/23/2019   HGB 8.5 (L) 11/23/2019   HCT 28.5 (L) 11/23/2019   MCV 91.3 11/23/2019   PLT 132 (L) 11/23/2019      Chemistry      Component Value Date/Time   NA 143 11/02/2019 0943   K 3.5 11/02/2019 0943   CL 109 11/02/2019 0943   CO2 26 11/02/2019 0943   BUN 23 11/02/2019 0943   CREATININE 1.50 (H) 11/02/2019 0943      Component Value Date/Time   CALCIUM 10.1 11/02/2019 0943   ALKPHOS 66 11/02/2019 0943   AST 15 11/02/2019 0943   ALT 6 11/02/2019 0943   BILITOT 0.4 11/02/2019 0943       RADIOGRAPHIC STUDIES: CT Chest W Contrast  Result Date: 11/07/2019 CLINICAL DATA:  Primary Cancer Type: Lung Imaging Indication: Assess response to therapy Interval therapy since last imaging? Yes Initial Cancer Diagnosis Date: 11/23/2017; Established by: Biopsy-proven Detailed Pathology: Non-small cell lung cancer initially diagnosed stage IIIB, squamous cell carcinoma.  Primary Tumor location: Right middle lobe. Recurrence? Yes; Date(s) of recurrence: 03/21/2019; Established by: Imaging only Surgeries: No thoracic. Hernia repair; hysterectomy; left hip arthroplasty. Chemotherapy: Yes; Ongoing? No; Most recent administration: 06/28/2019 Immunotherapy?  Yes; Type: Keytruda; Ongoing? Yes Radiation therapy? Yes; Date Range: 12/14/2017 - 01/21/2018; Target: Right lung Other Cancers: Tubular adenoma of colon.  Melanoma. EXAM: CT CHEST, ABDOMEN, AND PELVIS WITH CONTRAST TECHNIQUE: Multidetector CT imaging of the chest, abdomen and pelvis was performed following the standard protocol during bolus administration of intravenous contrast. CONTRAST:  63mL OMNIPAQUE IOHEXOL 300 MG/ML  SOLN COMPARISON:  Most recent CT chest 08/29/2019.  03/21/2019 PET-CT. FINDINGS: CT CHEST FINDINGS Cardiovascular: Ascending thoracic aorta is is ectatic measuring 4.3 cm. No acute findings Mediastinum/Nodes: No axillary or supraclavicular adenopathy. Port in the  anterior chest wall with tip in distal SVC. No mediastinal adenopathy. Lungs/Pleura: Large RIGHT hilar mass measures 6.7 x 5.3 cm. This compares to noncontrast CT 08/29/2019 mass measures 6.4 x 4.7 cm. No clear interval change. Central necrosis within the mass evident on this contrast exam. There is a moderate size RIGHT effusion unchanged. Perihilar radiation change in the RIGHT upper lobe is similar. Small subpleural nodules present which are less than 3 mm unchanged. LEFT lung clear. Musculoskeletal: Multiple levels of endplate sclerosis favored degenerative. No aggressive osseous lesion identified. CT ABDOMEN AND PELVIS FINDINGS Hepatobiliary: No focal hepatic lesion. No biliary ductal dilatation. Gallbladder is normal. Common bile duct is normal. Pancreas: Pancreas is normal. No ductal dilatation. No pancreatic inflammation. Spleen: Normal spleen Adrenals/urinary tract: Bilateral renal cortical thinning. Bilateral simple fluid attenuation cortical cysts. Large 11 cm cyst extends from the lower pole of the LEFT kidney. Stomach/Bowel: Stomach, small bowel, appendix, and cecum are normal. The colon and rectosigmoid colon are normal. Vascular/Lymphatic: Abdominal aorta is normal caliber. There is no retroperitoneal or periportal lymphadenopathy. No pelvic lymphadenopathy. Reproductive: Post hysterectomy. Other: No peritoneal metastasis. Musculoskeletal: LEFT hip prosthetic. No aggressive osseous lesion identified. IMPRESSION: Chest Impression: 1. Stable large RIGHT perihilar mass with central necrosis. 2. Stable RIGHT pleural effusion. 3. No new pulmonary nodularity. 4. Abdomen / Pelvis Impression: 1. No soft tissue metastasis in the abdomen pelvis. 2. No evidence skeletal metastasis. Electronically Signed   By: Suzy Bouchard M.D.   On: 11/07/2019 11:31   CT Abdomen Pelvis W Contrast  Result Date: 11/07/2019 CLINICAL DATA:  Primary Cancer Type: Lung Imaging Indication: Assess response to therapy Interval  therapy since last imaging? Yes Initial Cancer Diagnosis Date: 11/23/2017; Established by: Biopsy-proven Detailed Pathology: Non-small cell lung cancer initially diagnosed stage IIIB, squamous cell carcinoma. Primary Tumor location: Right middle lobe. Recurrence? Yes; Date(s) of recurrence: 03/21/2019; Established by: Imaging only Surgeries: No thoracic. Hernia repair; hysterectomy; left hip arthroplasty. Chemotherapy: Yes; Ongoing? No; Most recent administration: 06/28/2019 Immunotherapy?  Yes; Type: Keytruda; Ongoing? Yes Radiation therapy? Yes; Date Range: 12/14/2017 - 01/21/2018; Target: Right lung Other Cancers: Tubular adenoma of colon.  Melanoma. EXAM: CT CHEST, ABDOMEN, AND PELVIS WITH CONTRAST TECHNIQUE: Multidetector CT imaging of the chest, abdomen and pelvis was performed following the standard protocol during bolus administration of intravenous contrast. CONTRAST:  66mL OMNIPAQUE IOHEXOL 300 MG/ML  SOLN COMPARISON:  Most recent CT chest 08/29/2019.  03/21/2019 PET-CT. FINDINGS: CT CHEST FINDINGS Cardiovascular: Ascending thoracic aorta is is ectatic measuring 4.3 cm. No acute findings Mediastinum/Nodes: No axillary or supraclavicular adenopathy. Port in the anterior chest wall with tip in distal SVC. No mediastinal adenopathy. Lungs/Pleura: Large RIGHT hilar mass measures 6.7 x 5.3 cm. This compares to  noncontrast CT 08/29/2019 mass measures 6.4 x 4.7 cm. No clear interval change. Central necrosis within the mass evident on this contrast exam. There is a moderate size RIGHT effusion unchanged. Perihilar radiation change in the RIGHT upper lobe is similar. Small subpleural nodules present which are less than 3 mm unchanged. LEFT lung clear. Musculoskeletal: Multiple levels of endplate sclerosis favored degenerative. No aggressive osseous lesion identified. CT ABDOMEN AND PELVIS FINDINGS Hepatobiliary: No focal hepatic lesion. No biliary ductal dilatation. Gallbladder is normal. Common bile duct is  normal. Pancreas: Pancreas is normal. No ductal dilatation. No pancreatic inflammation. Spleen: Normal spleen Adrenals/urinary tract: Bilateral renal cortical thinning. Bilateral simple fluid attenuation cortical cysts. Large 11 cm cyst extends from the lower pole of the LEFT kidney. Stomach/Bowel: Stomach, small bowel, appendix, and cecum are normal. The colon and rectosigmoid colon are normal. Vascular/Lymphatic: Abdominal aorta is normal caliber. There is no retroperitoneal or periportal lymphadenopathy. No pelvic lymphadenopathy. Reproductive: Post hysterectomy. Other: No peritoneal metastasis. Musculoskeletal: LEFT hip prosthetic. No aggressive osseous lesion identified. IMPRESSION: Chest Impression: 1. Stable large RIGHT perihilar mass with central necrosis. 2. Stable RIGHT pleural effusion. 3. No new pulmonary nodularity. 4. Abdomen / Pelvis Impression: 1. No soft tissue metastasis in the abdomen pelvis. 2. No evidence skeletal metastasis. Electronically Signed   By: Suzy Bouchard M.D.   On: 11/07/2019 11:31    ASSESSMENT AND PLAN: This is a very pleasant 86 years old white female with stage IIIb non-small cell lung cancer, squamous cell carcinoma.  She is currently undergoing a course of concurrent chemoradiation with weekly carboplatin and paclitaxel status post 6 cycles.  She tolerated this treatment well with partial response. The patient also completed the course of consolidation treatment with immunotherapy with Imfinzi status post 26 cycles.  She tolerated her previous treatment well. Her recent CT scan of the chest showed progressive consolidation in the right lung suspicious for disease recurrence but also worsening post radiation effect could not be excluded.  A PET scan showed rounded hypermetabolic soft tissue mass inferior to the right hilum consistent with lung cancer recurrence.  There was also hypermetabolic precarinal lymph node concerning for nodal metastasis but no evidence for  metastatic disease in the abdomen or pelvis.  The patient also had groundglass density in the right upper lobe, right middle lobe as well as left lower lobe suspicious for inflammatory process and less likely malignancy. The patient started treatment with systemic chemotherapy with carboplatin for AUC of 5, paclitaxel 175 mg/M2 and Keytruda 200 mg IV every 3 weeks with Neulasta support status post 10 cycles.  Starting from cycle #5 the patient is currently on maintenance treatment with single agent Keytruda every 3 weeks.   The patient continues to tolerate her treatment well except for the fatigue from the anemia of chronic disease. I recommended for her to proceed with cycle #11 today as planned. I will see her back for follow-up visit in 3 weeks for evaluation before starting cycle #12. She was advised to call immediately if she has any concerning symptoms in the interval. The patient voices understanding of current disease status and treatment options and is in agreement with the current care plan. All questions were answered. The patient knows to call the clinic with any problems, questions or concerns. We can certainly see the patient much sooner if necessary.  Disclaimer: This note was dictated with voice recognition software. Similar sounding words can inadvertently be transcribed and may not be corrected upon review.

## 2019-11-23 NOTE — Progress Notes (Signed)
Nutrition follow-up completed with patient during infusion for recurrent non-small cell lung cancer. Weight continues to trend down and was documented as 138.8 pounds on August 26 decreased from 154 pounds March 23. Patient denies nausea, vomiting, constipation, and diarrhea. She will drink Ensure and especially likes to have 1 for breakfast. She has no complaints today.  Nutrition diagnosis: Unintended weight loss continues.  Intervention: Reviewed strategies for increasing calories and protein in small frequent meals and snacks. Encouraged increased Ensure plus and provided additional coupons.  Monitoring, evaluation, goals: Patient will work to increase calories and protein to minimize weight loss and promote weight stabilization.  Next visit: Thursday, October 7 during infusion.  Patient has my contact information for questions.  **Disclaimer: This note was dictated with voice recognition software. Similar sounding words can inadvertently be transcribed and this note may contain transcription errors which may not have been corrected upon publication of note.**

## 2019-11-23 NOTE — Patient Instructions (Signed)

## 2019-11-23 NOTE — Patient Instructions (Signed)
[facility] Discharge Instructions for Patients Receiving Chemotherapy  Today you received the following chemotherapy agents Pembrolizumab [facility]).  To help prevent nausea and vomiting after your treatment, we encourage you to take your nausea medication as prescribed.   If you develop nausea and vomiting that is not controlled by your nausea medication, call the clinic.   BELOW ARE SYMPTOMS THAT SHOULD BE REPORTED IMMEDIATELY:  *FEVER GREATER THAN 100.5 F  *CHILLS WITH OR WITHOUT FEVER  NAUSEA AND VOMITING THAT IS NOT CONTROLLED WITH YOUR NAUSEA MEDICATION  *UNUSUAL SHORTNESS OF BREATH  *UNUSUAL BRUISING OR BLEEDING  TENDERNESS IN MOUTH AND THROAT WITH OR WITHOUT PRESENCE OF ULCERS  *URINARY PROBLEMS  *BOWEL PROBLEMS  UNUSUAL RASH Items with * indicate a potential emergency and should be followed up as soon as possible.  Feel free to call the clinic should you have any questions or concerns. The clinic phone number is [phone].  Please show the [facility] at check-in to the Emergency Department and triage nurse.

## 2019-12-12 NOTE — Progress Notes (Signed)
[facility] OFFICE PROGRESS NOTE  [name], MD [place] [address]  DIAGNOSIS: Recurrent non-small cell lung cancer initially diagnosed stage IIIB (T3, N3, M0) non-small cell lung cancer, squamous cell carcinoma presented with large right middle lobe lung mass in addition to right hilar, subcarinal and right supraclavicular lymphadenopathy diagnosed in August 2019.The patient had evidence for disease progression in December 2020.  PRIOR THERAPY: 1) Concurrent chemoradiation with weekly carboplatin for AUC of 2 and paclitaxel 45 NG/M2. Status post 6 cycles with partial response. 2) Consolidation immunotherapy with Imfinzi 10 mg/KG every 2 weeks started February 28, 2018. Status post 26 cycles. Last cycle was given on February 21, 2019  CURRENT THERAPY: Systemic chemotherapy with carboplatin for an AUC of 5, paclitaxel 175 mg/m and Keytruda 200 mg IV every 3 weeks with Neulasta support. First dose on April 05, 2018.Status post11cycles.Starting from cycle #5 she will be on single agent maintenance Keytruda  INTERVAL HISTORY: Lisa [name] 86 y.o. female returns to clinic today for a follow-up visit accompanied by her grandaughter.  The patient is feeling well today without any concerning complaints. The patient continues to tolerate single agent Keytruda well without any concerning adverse side effects.  She denies any recent fever, chills, or night sweats. Her weight is stable. She denies any chest pain, hemoptysis, or cough.  She states she sometimes feels short of breath with exertion but she does not exert herself often. She denies any nausea, vomiting, diarrhea, or constipation.  She denies any headache or visual changes.  She denies any rashes but does have an enlarging skin lesion on the right side of her face. She is scheduled to see a dermatologist next week regarding this. The patient is here today for evaluation before starting cycle  #12.  MEDICAL HISTORY: Past Medical History:  Diagnosis Date  . Diverticulosis   . Headache   . Hemorrhoids    internal and external  . Hyperlipidemia 01/29/1991  . Hypertension 10/29/1995  . Hypothyroidism 10/29/1995  . Lung mass    right middle lobe  . Melanoma ([place])    h/o, local excision. no chemo or rady.   . Skin cancer (melanoma) ([place])   . Tubular adenoma of colon   . Wears dentures     ALLERGIES:  is allergic to atorvastatin, celebrex [celecoxib], cholecalciferol, simvastatin, and tape.  MEDICATIONS:  Current Outpatient Medications  Medication Sig Dispense Refill  . albuterol (VENTOLIN HFA) 108 (90 Base) MCG/ACT inhaler Inhale 2 puffs into the lungs every 6 (six) hours as needed for wheezing or shortness of breath. 6.7 g 0  . amLODipine (NORVASC) 10 MG tablet Take 1 tablet by mouth once daily 90 tablet 0  . Cholecalciferol (VITAMIN D3) 50 MCG (2000 UT) TABS Take 2,000 Units by mouth 3 (three) times a week.    . Coenzyme Q10 100 MG capsule Take 100 mg by mouth daily.     Marland Kitchen docusate sodium (COLACE) 100 MG capsule Take 100 mg by mouth daily as needed for mild constipation.    . fluticasone (FLONASE) 50 MCG/ACT nasal spray USE 2 SPRAYS IN EACH NOSTRIL DAILY AS NEEDED FOR ALLERGIES 48 g 3  . levothyroxine (SYNTHROID) 75 MCG tablet Take 1 tablet by mouth once daily 90 tablet 0  . loratadine (CLARITIN) 10 MG tablet Take 1 tablet (10 mg total) by mouth daily as needed for allergies or rhinitis.    . mirtazapine (REMERON) 7.5 MG tablet TAKE 1 TABLET BY  MOUTH ONCE DAILY AT BEDTIME 30 tablet 2  . Multiple Vitamin (MULTIVITAMIN WITH MINERALS) TABS tablet Take 1 tablet by mouth daily.    . sennosides-docusate sodium (SENOKOT-S) 8.6-50 MG tablet Take 1 tablet by mouth daily as needed for constipation.     No current facility-administered medications for this visit.   Facility-Administered Medications Ordered in Other Visits  Medication Dose Route Frequency Provider Last Rate Last  Admin  . acetaminophen (TYLENOL) tablet 650 mg  650 mg Oral Once Nicholas Lose, MD        SURGICAL HISTORY:  Past Surgical History:  Procedure Laterality Date  . BRONCHIAL BIOPSY  11/23/2017   Procedure: BRONCHIAL BIOPSIES;  Surgeon: Grace Isaac, MD;  Location: [facility];  Service: Thoracic;;  . cataract surgery  2007, 2008   repair bilat  . DG BARIUM ENEMA ([place] HX) N/A 2016   [name]  . HERNIA REPAIR  04/25/2001  . HIP ARTHROPLASTY Left 05/31/2019   Procedure: LEFT HIP HEMIARTHROPLASTY;  Surgeon: Hiram Gash, MD;  Location: [facility];  Service: Orthopedics;  Laterality: Left;  . IR IMAGING GUIDED PORT INSERTION  07/05/2018  . IR US GUIDE BX ASP/DRAIN  11/17/2017  . KNEE SURGERY     meniscus tear per Dr. Ronnie Derby, R knee  . MULTIPLE TOOTH EXTRACTIONS    . SEPTOPLASTY  2006   Dr. Ernesto Rutherford  . SKIN CANCER EXCISION    . TONSILLECTOMY  1954  . VAGINAL HYSTERECTOMY    . VIDEO BRONCHOSCOPY WITH ENDOBRONCHIAL ULTRASOUND N/A 11/23/2017   Procedure: VIDEO BRONCHOSCOPY WITH ENDOBRONCHIAL ULTRASOUND;  Surgeon: Grace Isaac, MD;  Location: [place];  Service: Thoracic;  Laterality: N/A;    REVIEW OF SYSTEMS:   Review of Systems  Constitutional: Negative for appetite change, chills, fatigue, fever and unexpected weight change.  HENT: Negative for mouth sores, nosebleeds, sore throat and trouble swallowing.   Eyes: Negative for eye problems and icterus.  Respiratory: Positive for mild dyspnea on exertion. Negative for cough, hemoptysis, and wheezing.   Cardiovascular:  Positive for bilateral baseline lower extremity swelling. Negative for chest pain.  Gastrointestinal: Negative for abdominal pain, constipation, diarrhea, nausea and vomiting.  Genitourinary: Negative for bladder incontinence, difficulty urinating, dysuria, frequency and hematuria.   Musculoskeletal: Negative for back pain, gait problem, neck pain and neck stiffness.  Skin: Positive for enlarging nodular lesion on right cheek.  Negative for itching and rash.  Neurological: Negative for dizziness, extremity weakness, gait problem, headaches, light-headedness and seizures.  Hematological: Negative for adenopathy. Does not bruise/bleed easily.  Psychiatric/Behavioral: Positive for some dementia. Negative for confusion, depression and sleep disturbance. The patient is not nervous/anxious.      PHYSICAL EXAMINATION:  Blood pressure 138/73, pulse 81, temperature 97.8 F (36.6 C), temperature source Tympanic, resp. rate 18, height 5\' 7"  (1.702 m), weight 139 lb 8 oz (63.3 kg), SpO2 100 %.  ECOG PERFORMANCE STATUS: 1 - Symptomatic but completely ambulatory  Physical Exam  Constitutional: Oriented to person, place, and time andelderly appearing femaleand in no distress.   HENT:  Head: Normocephalic and atraumatic.  Mouth/Throat: Oropharynx is clear and moist. No oropharyngeal exudate.  Eyes: Conjunctivae are normal. Right eye exhibits no discharge. Left eye exhibits no discharge. No scleral icterus.  Neck: Normal range of motion. Neck supple.  Cardiovascular: Has some JVD noted on exam. Normal rate, regular rhythm, normal heart sounds and intact distal pulses.  Pulmonary/Chest: Effort normal. Wheezing in lung bilaterally. No respiratory distress. No wheezes. No rales.  Abdominal:  Soft. Bowel sounds are normal. Exhibits no distension and no mass. There is no tenderness.  Musculoskeletal: Bilateral lower extremity swelling.Normal range of motion.  Lymphadenopathy:  No cervical adenopathy.  Neurological: Alert and oriented to person, place, and time. Exhibits normal muscle tone. The patient was examined in the wheel chair. Skin: Skin is warm and dry. No rash noted. Not diaphoretic. No erythema. No pallor.  Psychiatric: Mood and judgment normal. Has some memory deficits. Vitals reviewed.  LABORATORY DATA: Lab Results  Component Value Date   WBC 5.0 12/14/2019   HGB 8.3 (L) 12/14/2019   HCT 28.0 (L) 12/14/2019    MCV 90.3 12/14/2019   PLT 142 (L) 12/14/2019      Chemistry      Component Value Date/Time   NA 143 12/14/2019 0900   K 3.7 12/14/2019 0900   CL 107 12/14/2019 0900   CO2 28 12/14/2019 0900   BUN 25 (H) 12/14/2019 0900   CREATININE 1.44 (H) 12/14/2019 0900      Component Value Date/Time   CALCIUM 9.6 12/14/2019 0900   ALKPHOS 67 12/14/2019 0900   AST 17 12/14/2019 0900   ALT 8 12/14/2019 0900   BILITOT 0.3 12/14/2019 0900       RADIOGRAPHIC STUDIES:  No results found.   ASSESSMENT/PLAN:  This is a very pleasant 86 year old Caucasian female withrecurrent non-small cell lung cancer initially diagnosed stage IIIB (T3, N3, M0) non-small cell lung cancer, squamous cell carcinoma presented with large right middle lobe lung mass in addition to right hilar, subcarinal and right supraclavicular lymphadenopathy diagnosed in August 2019.The patient had evidence for disease progression in December 2020.  She completed a course of concurrent chemoradiation with carboplatin for an AUC of 2 and paclitaxel 5 mg/m. She is status post 6 cycles with a partial response.  She then completed consolidation immunotherapy with Imfinzi 10 mg/kg IV every 2 weeks. She is status post 26 cycles. She showed evidence of disease progression on her restaging CT scan.  The patient is currently undergoing treatment with carboplatin for an AUC of 5, paclitaxel 175 mg/m, Keytruda 200 mg IV every 3 weeks with Neulasta support.She is status post11cycles. She was hospitalized for a left hip fracturein March 2021. Her treatment was on hold while she was recovering.Startingfrom cycle #5, the patientstartedmaintenance treatment with single agent immunotherapy withKeytruda 200 mg IV every 3 weeks.  Labs were reviewed. Recommend that she proceed with cycle #12 today as scheduled.   We will see her back for a follow up visit in 3 weeks for evaluation before starting cycle #13.   Her TSH is still  pending today but we will make dose adjustments as necessary.  The patient was advised to call immediately if she has any concerning symptoms in the interval. The patient voices understanding of current disease status and treatment options and is in agreement with the current care plan. All questions were answered. The patient knows to call the clinic with any problems, questions or concerns. We can certainly see the patient much sooner if necessary      No orders of the defined types were placed in this encounter.    Anaston Koehn L Jonatha Gagen, PA-C 12/14/19

## 2019-12-13 ENCOUNTER — Other Ambulatory Visit: Payer: Self-pay | Admitting: Physician Assistant

## 2019-12-13 DIAGNOSIS — R630 Anorexia: Secondary | ICD-10-CM

## 2019-12-13 DIAGNOSIS — C3491 Malignant neoplasm of unspecified part of right bronchus or lung: Secondary | ICD-10-CM

## 2019-12-13 DIAGNOSIS — R2681 Unsteadiness on feet: Secondary | ICD-10-CM | POA: Diagnosis not present

## 2019-12-13 DIAGNOSIS — I82409 Acute embolism and thrombosis of unspecified deep veins of unspecified lower extremity: Secondary | ICD-10-CM | POA: Diagnosis not present

## 2019-12-13 DIAGNOSIS — S72002D Fracture of unspecified part of neck of left femur, subsequent encounter for closed fracture with routine healing: Secondary | ICD-10-CM | POA: Diagnosis not present

## 2019-12-13 DIAGNOSIS — M6281 Muscle weakness (generalized): Secondary | ICD-10-CM | POA: Diagnosis not present

## 2019-12-14 ENCOUNTER — Inpatient Hospital Stay: Payer: Medicare Other | Attending: Internal Medicine | Admitting: Physician Assistant

## 2019-12-14 ENCOUNTER — Other Ambulatory Visit: Payer: Self-pay

## 2019-12-14 ENCOUNTER — Inpatient Hospital Stay: Payer: Medicare Other

## 2019-12-14 VITALS — BP 138/73 | HR 81 | Temp 97.8°F | Resp 18 | Ht 67.0 in | Wt 139.5 lb

## 2019-12-14 DIAGNOSIS — Z95828 Presence of other vascular implants and grafts: Secondary | ICD-10-CM

## 2019-12-14 DIAGNOSIS — I10 Essential (primary) hypertension: Secondary | ICD-10-CM | POA: Insufficient documentation

## 2019-12-14 DIAGNOSIS — R0609 Other forms of dyspnea: Secondary | ICD-10-CM | POA: Insufficient documentation

## 2019-12-14 DIAGNOSIS — Z79899 Other long term (current) drug therapy: Secondary | ICD-10-CM | POA: Diagnosis not present

## 2019-12-14 DIAGNOSIS — E039 Hypothyroidism, unspecified: Secondary | ICD-10-CM | POA: Insufficient documentation

## 2019-12-14 DIAGNOSIS — Z9221 Personal history of antineoplastic chemotherapy: Secondary | ICD-10-CM | POA: Insufficient documentation

## 2019-12-14 DIAGNOSIS — C342 Malignant neoplasm of middle lobe, bronchus or lung: Secondary | ICD-10-CM | POA: Diagnosis not present

## 2019-12-14 DIAGNOSIS — Z5112 Encounter for antineoplastic immunotherapy: Secondary | ICD-10-CM | POA: Diagnosis not present

## 2019-12-14 DIAGNOSIS — C3491 Malignant neoplasm of unspecified part of right bronchus or lung: Secondary | ICD-10-CM

## 2019-12-14 DIAGNOSIS — Z923 Personal history of irradiation: Secondary | ICD-10-CM | POA: Diagnosis not present

## 2019-12-14 DIAGNOSIS — E785 Hyperlipidemia, unspecified: Secondary | ICD-10-CM | POA: Insufficient documentation

## 2019-12-14 LAB — CMP (CANCER CENTER ONLY)
ALT: 8 U/L (ref 0–44)
AST: 17 U/L (ref 15–41)
Albumin: 3 g/dL — ABNORMAL LOW (ref 3.5–5.0)
Alkaline Phosphatase: 67 U/L (ref 38–126)
Anion gap: 8 (ref 5–15)
BUN: 25 mg/dL — ABNORMAL HIGH (ref 8–23)
CO2: 28 mmol/L (ref 22–32)
Calcium: 9.6 mg/dL (ref 8.9–10.3)
Chloride: 107 mmol/L (ref 98–111)
Creatinine: 1.44 mg/dL — ABNORMAL HIGH (ref 0.44–1.00)
GFR, Est AFR Am: 38 mL/min — ABNORMAL LOW (ref >60)
GFR, Estimated: 33 mL/min — ABNORMAL LOW (ref >60)
Glucose, Bld: 99 mg/dL (ref 70–99)
Potassium: 3.7 mmol/L (ref 3.5–5.1)
Sodium: 143 mmol/L (ref 135–145)
Total Bilirubin: 0.3 mg/dL (ref 0.3–1.2)
Total Protein: 6.7 g/dL (ref 6.5–8.1)

## 2019-12-14 LAB — CBC WITH DIFFERENTIAL (CANCER CENTER ONLY)
Abs Immature Granulocytes: 0.01 10*3/uL (ref 0.00–0.07)
Basophils Absolute: 0 10*3/uL (ref 0.0–0.1)
Basophils Relative: 0 %
Eosinophils Absolute: 0.1 10*3/uL (ref 0.0–0.5)
Eosinophils Relative: 3 %
HCT: 28 % — ABNORMAL LOW (ref 36.0–46.0)
Hemoglobin: 8.3 g/dL — ABNORMAL LOW (ref 12.0–15.0)
Immature Granulocytes: 0 %
Lymphocytes Relative: 18 %
Lymphs Abs: 0.9 10*3/uL (ref 0.7–4.0)
MCH: 26.8 pg (ref 26.0–34.0)
MCHC: 29.6 g/dL — ABNORMAL LOW (ref 30.0–36.0)
MCV: 90.3 fL (ref 80.0–100.0)
Monocytes Absolute: 0.4 10*3/uL (ref 0.1–1.0)
Monocytes Relative: 7 %
Neutro Abs: 3.6 10*3/uL (ref 1.7–7.7)
Neutrophils Relative %: 72 %
Platelet Count: 142 10*3/uL — ABNORMAL LOW (ref 150–400)
RBC: 3.1 MIL/uL — ABNORMAL LOW (ref 3.87–5.11)
RDW: 15.5 % (ref 11.5–15.5)
WBC Count: 5 10*3/uL (ref 4.0–10.5)
nRBC: 0 % (ref 0.0–0.2)

## 2019-12-14 LAB — TSH: TSH: 3.756 u[IU]/mL (ref 0.308–3.960)

## 2019-12-14 MED ORDER — HEPARIN SOD (PORK) LOCK FLUSH 100 UNIT/ML IV SOLN
500.0000 [IU] | Freq: Once | INTRAVENOUS | Status: AC | PRN
  Administered 2019-12-14: 500 [IU]
  Filled 2019-12-14: qty 5

## 2019-12-14 MED ORDER — SODIUM CHLORIDE 0.9% FLUSH
10.0000 mL | INTRAVENOUS | Status: DC | PRN
  Administered 2019-12-14: 10 mL
  Filled 2019-12-14: qty 10

## 2019-12-14 MED ORDER — SODIUM CHLORIDE 0.9% FLUSH
10.0000 mL | Freq: Once | INTRAVENOUS | Status: AC
  Administered 2019-12-14: 10 mL
  Filled 2019-12-14: qty 10

## 2019-12-14 MED ORDER — SODIUM CHLORIDE 0.9 % IV SOLN
200.0000 mg | Freq: Once | INTRAVENOUS | Status: AC
  Administered 2019-12-14: 200 mg via INTRAVENOUS
  Filled 2019-12-14: qty 8

## 2019-12-14 MED ORDER — SODIUM CHLORIDE 0.9 % IV SOLN
Freq: Once | INTRAVENOUS | Status: AC
  Filled 2019-12-14: qty 250

## 2019-12-14 NOTE — Patient Instructions (Signed)
[facility] Discharge Instructions for Patients Receiving Chemotherapy  Today you received the following chemotherapy agents Pembrolizumab [facility]).  To help prevent nausea and vomiting after your treatment, we encourage you to take your nausea medication as prescribed.   If you develop nausea and vomiting that is not controlled by your nausea medication, call the clinic.   BELOW ARE SYMPTOMS THAT SHOULD BE REPORTED IMMEDIATELY:  *FEVER GREATER THAN 100.5 F  *CHILLS WITH OR WITHOUT FEVER  NAUSEA AND VOMITING THAT IS NOT CONTROLLED WITH YOUR NAUSEA MEDICATION  *UNUSUAL SHORTNESS OF BREATH  *UNUSUAL BRUISING OR BLEEDING  TENDERNESS IN MOUTH AND THROAT WITH OR WITHOUT PRESENCE OF ULCERS  *URINARY PROBLEMS  *BOWEL PROBLEMS  UNUSUAL RASH Items with * indicate a potential emergency and should be followed up as soon as possible.  Feel free to call the clinic should you have any questions or concerns. The clinic phone number is [phone].  Please show the [facility] at check-in to the Emergency Department and triage nurse.

## 2019-12-19 DIAGNOSIS — Z85828 Personal history of other malignant neoplasm of skin: Secondary | ICD-10-CM | POA: Diagnosis not present

## 2019-12-19 DIAGNOSIS — C44329 Squamous cell carcinoma of skin of other parts of face: Secondary | ICD-10-CM | POA: Diagnosis not present

## 2020-01-04 ENCOUNTER — Other Ambulatory Visit: Payer: Self-pay

## 2020-01-04 ENCOUNTER — Encounter: Payer: Self-pay | Admitting: Internal Medicine

## 2020-01-04 ENCOUNTER — Inpatient Hospital Stay: Payer: Medicare Other

## 2020-01-04 ENCOUNTER — Inpatient Hospital Stay: Payer: Medicare Other | Admitting: Nutrition

## 2020-01-04 ENCOUNTER — Inpatient Hospital Stay: Payer: Medicare Other | Attending: Internal Medicine | Admitting: Internal Medicine

## 2020-01-04 VITALS — BP 139/66 | HR 81 | Temp 97.8°F | Resp 18 | Ht 67.0 in | Wt 139.8 lb

## 2020-01-04 DIAGNOSIS — C342 Malignant neoplasm of middle lobe, bronchus or lung: Secondary | ICD-10-CM | POA: Insufficient documentation

## 2020-01-04 DIAGNOSIS — D638 Anemia in other chronic diseases classified elsewhere: Secondary | ICD-10-CM | POA: Diagnosis not present

## 2020-01-04 DIAGNOSIS — E039 Hypothyroidism, unspecified: Secondary | ICD-10-CM | POA: Insufficient documentation

## 2020-01-04 DIAGNOSIS — Z923 Personal history of irradiation: Secondary | ICD-10-CM | POA: Diagnosis not present

## 2020-01-04 DIAGNOSIS — R0609 Other forms of dyspnea: Secondary | ICD-10-CM | POA: Diagnosis not present

## 2020-01-04 DIAGNOSIS — Z85820 Personal history of malignant melanoma of skin: Secondary | ICD-10-CM | POA: Diagnosis not present

## 2020-01-04 DIAGNOSIS — Z5112 Encounter for antineoplastic immunotherapy: Secondary | ICD-10-CM

## 2020-01-04 DIAGNOSIS — C3491 Malignant neoplasm of unspecified part of right bronchus or lung: Secondary | ICD-10-CM

## 2020-01-04 DIAGNOSIS — Z79899 Other long term (current) drug therapy: Secondary | ICD-10-CM | POA: Insufficient documentation

## 2020-01-04 DIAGNOSIS — Z95828 Presence of other vascular implants and grafts: Secondary | ICD-10-CM

## 2020-01-04 DIAGNOSIS — R5383 Other fatigue: Secondary | ICD-10-CM | POA: Insufficient documentation

## 2020-01-04 DIAGNOSIS — I10 Essential (primary) hypertension: Secondary | ICD-10-CM | POA: Diagnosis not present

## 2020-01-04 DIAGNOSIS — C3490 Malignant neoplasm of unspecified part of unspecified bronchus or lung: Secondary | ICD-10-CM

## 2020-01-04 DIAGNOSIS — Z9221 Personal history of antineoplastic chemotherapy: Secondary | ICD-10-CM | POA: Insufficient documentation

## 2020-01-04 DIAGNOSIS — E785 Hyperlipidemia, unspecified: Secondary | ICD-10-CM | POA: Diagnosis not present

## 2020-01-04 LAB — CBC WITH DIFFERENTIAL (CANCER CENTER ONLY)
Abs Immature Granulocytes: 0.02 10*3/uL (ref 0.00–0.07)
Basophils Absolute: 0 10*3/uL (ref 0.0–0.1)
Basophils Relative: 0 %
Eosinophils Absolute: 0.1 10*3/uL (ref 0.0–0.5)
Eosinophils Relative: 2 %
HCT: 29.4 % — ABNORMAL LOW (ref 36.0–46.0)
Hemoglobin: 8.6 g/dL — ABNORMAL LOW (ref 12.0–15.0)
Immature Granulocytes: 0 %
Lymphocytes Relative: 14 %
Lymphs Abs: 0.7 10*3/uL (ref 0.7–4.0)
MCH: 26.3 pg (ref 26.0–34.0)
MCHC: 29.3 g/dL — ABNORMAL LOW (ref 30.0–36.0)
MCV: 89.9 fL (ref 80.0–100.0)
Monocytes Absolute: 0.3 10*3/uL (ref 0.1–1.0)
Monocytes Relative: 6 %
Neutro Abs: 3.9 10*3/uL (ref 1.7–7.7)
Neutrophils Relative %: 78 %
Platelet Count: 137 10*3/uL — ABNORMAL LOW (ref 150–400)
RBC: 3.27 MIL/uL — ABNORMAL LOW (ref 3.87–5.11)
RDW: 15.7 % — ABNORMAL HIGH (ref 11.5–15.5)
WBC Count: 5 10*3/uL (ref 4.0–10.5)
nRBC: 0 % (ref 0.0–0.2)

## 2020-01-04 LAB — CMP (CANCER CENTER ONLY)
ALT: 11 U/L (ref 0–44)
AST: 18 U/L (ref 15–41)
Albumin: 3.1 g/dL — ABNORMAL LOW (ref 3.5–5.0)
Alkaline Phosphatase: 66 U/L (ref 38–126)
Anion gap: 6 (ref 5–15)
BUN: 21 mg/dL (ref 8–23)
CO2: 30 mmol/L (ref 22–32)
Calcium: 10 mg/dL (ref 8.9–10.3)
Chloride: 107 mmol/L (ref 98–111)
Creatinine: 1.56 mg/dL — ABNORMAL HIGH (ref 0.44–1.00)
GFR, Estimated: 30 mL/min — ABNORMAL LOW (ref >60)
Glucose, Bld: 82 mg/dL (ref 70–99)
Potassium: 3.5 mmol/L (ref 3.5–5.1)
Sodium: 143 mmol/L (ref 135–145)
Total Bilirubin: 0.4 mg/dL (ref 0.3–1.2)
Total Protein: 6.9 g/dL (ref 6.5–8.1)

## 2020-01-04 LAB — TSH: TSH: 4.342 u[IU]/mL — ABNORMAL HIGH (ref 0.308–3.960)

## 2020-01-04 MED ORDER — SODIUM CHLORIDE 0.9 % IV SOLN
Freq: Once | INTRAVENOUS | Status: AC
  Filled 2020-01-04: qty 250

## 2020-01-04 MED ORDER — ALTEPLASE 2 MG IJ SOLR
INTRAMUSCULAR | Status: AC
  Filled 2020-01-04: qty 2

## 2020-01-04 MED ORDER — SODIUM CHLORIDE 0.9 % IV SOLN
200.0000 mg | Freq: Once | INTRAVENOUS | Status: AC
  Administered 2020-01-04: 200 mg via INTRAVENOUS
  Filled 2020-01-04: qty 8

## 2020-01-04 MED ORDER — HEPARIN SOD (PORK) LOCK FLUSH 100 UNIT/ML IV SOLN
500.0000 [IU] | Freq: Once | INTRAVENOUS | Status: AC | PRN
  Administered 2020-01-04: 500 [IU]
  Filled 2020-01-04: qty 5

## 2020-01-04 MED ORDER — SODIUM CHLORIDE 0.9% FLUSH
10.0000 mL | INTRAVENOUS | Status: DC | PRN
  Administered 2020-01-04: 10 mL
  Filled 2020-01-04: qty 10

## 2020-01-04 MED ORDER — ALTEPLASE 2 MG IJ SOLR
2.0000 mg | Freq: Once | INTRAMUSCULAR | Status: AC
  Administered 2020-01-04: 2 mg
  Filled 2020-01-04: qty 2

## 2020-01-04 MED ORDER — SODIUM CHLORIDE 0.9% FLUSH
10.0000 mL | Freq: Once | INTRAVENOUS | Status: AC
  Administered 2020-01-04: 10 mL
  Filled 2020-01-04: qty 10

## 2020-01-04 NOTE — Progress Notes (Signed)
No blood return from port. cathflo administered by Dierdre Harness RN  Labs drawn peripherally

## 2020-01-04 NOTE — Patient Instructions (Signed)
[name] Discharge Instructions for Patients Receiving Chemotherapy  Today you received the following chemotherapy agents Pembrolizumab [facility]).  To help prevent nausea and vomiting after your treatment, we encourage you to take your nausea medication as prescribed.   If you develop nausea and vomiting that is not controlled by your nausea medication, call the clinic.   BELOW ARE SYMPTOMS THAT SHOULD BE REPORTED IMMEDIATELY:  *FEVER GREATER THAN 100.5 F  *CHILLS WITH OR WITHOUT FEVER  NAUSEA AND VOMITING THAT IS NOT CONTROLLED WITH YOUR NAUSEA MEDICATION  *UNUSUAL SHORTNESS OF BREATH  *UNUSUAL BRUISING OR BLEEDING  TENDERNESS IN MOUTH AND THROAT WITH OR WITHOUT PRESENCE OF ULCERS  *URINARY PROBLEMS  *BOWEL PROBLEMS  UNUSUAL RASH Items with * indicate a potential emergency and should be followed up as soon as possible.  Feel free to call the clinic should you have any questions or concerns. The clinic phone number is [phone].  Please show the [facility] at check-in to the Emergency Department and triage nurse.

## 2020-01-04 NOTE — Progress Notes (Signed)
[facility] [phone]   [phone]  OFFICE PROGRESS NOTE  [name], MD [place] [address]  DIAGNOSIS: Recurrent non-small cell lung cancer initially diagnosed stage IIIB (T3, N3, M0) non-small cell lung cancer, squamous cell carcinoma presented with large right middle lobe lung mass in addition to right hilar, subcarinal and right supraclavicular lymphadenopathy diagnosed in August 2019.  The patient had evidence for disease progression in December 2020.  PRIOR THERAPY:  1) Concurrent chemoradiation with weekly carboplatin for AUC of 2 and paclitaxel 45 NG/M2.  Status post 6 cycles with partial response. 2) Consolidation immunotherapy with Imfinzi 10 mg/KG every 2 weeks started February 28, 2018.  Status post 26 cycles.  Last cycle was given on February 21, 2019  CURRENT THERAPY: Systemic chemotherapy with carboplatin for AUC of 5, paclitaxel 175 mg/M2 and Keytruda 200 mg IV every 3 weeks.  First dose April 03, 2018.  Status post 12 cycles.  INTERVAL HISTORY: Lisa [name] 86 y.o. female returns to the clinic today for follow-up visit.  The patient is feeling fine with no concerning complaints except for the baseline fatigue and shortness of breath.  She denied having any chest pain, cough or hemoptysis.  She denied having any fever or chills.  She has no nausea, vomiting, diarrhea or constipation.  She has no headache or visual changes.  She continues to tolerate her treatment with single agent Keytruda fairly well.  She is here today for evaluation before starting cycle #13.  MEDICAL HISTORY: Past Medical History:  Diagnosis Date  . Diverticulosis   . Headache   . Hemorrhoids    internal and external  . Hyperlipidemia 01/29/1991  . Hypertension 10/29/1995  . Hypothyroidism 10/29/1995  . Lung mass    right middle lobe  . Melanoma ([place])    h/o, local excision. no chemo or rady.   . Skin cancer (melanoma)  ([place])   . Tubular adenoma of colon   . Wears dentures     ALLERGIES:  is allergic to atorvastatin, celebrex [celecoxib], cholecalciferol, simvastatin, and tape.  MEDICATIONS:  Current Outpatient Medications  Medication Sig Dispense Refill  . albuterol (VENTOLIN HFA) 108 (90 Base) MCG/ACT inhaler Inhale 2 puffs into the lungs every 6 (six) hours as needed for wheezing or shortness of breath. 6.7 g 0  . amLODipine (NORVASC) 10 MG tablet Take 1 tablet by mouth once daily 90 tablet 0  . Cholecalciferol (VITAMIN D3) 50 MCG (2000 UT) TABS Take 2,000 Units by mouth 3 (three) times a week.    . Coenzyme Q10 100 MG capsule Take 100 mg by mouth daily.     Marland Kitchen docusate sodium (COLACE) 100 MG capsule Take 100 mg by mouth daily as needed for mild constipation.    . fluticasone (FLONASE) 50 MCG/ACT nasal spray USE 2 SPRAYS IN EACH NOSTRIL DAILY AS NEEDED FOR ALLERGIES 48 g 3  . levothyroxine (SYNTHROID) 75 MCG tablet Take 1 tablet by mouth once daily 90 tablet 0  . loratadine (CLARITIN) 10 MG tablet Take 1 tablet (10 mg total) by mouth daily as needed for allergies or rhinitis.    . mirtazapine (REMERON) 7.5 MG tablet TAKE 1 TABLET BY MOUTH ONCE DAILY AT BEDTIME 30 tablet 2  . Multiple Vitamin (MULTIVITAMIN WITH MINERALS) TABS tablet Take 1 tablet by mouth daily.    . sennosides-docusate sodium (SENOKOT-S) 8.6-50 MG tablet Take 1 tablet by mouth daily as needed for constipation.  No current facility-administered medications for this visit.   Facility-Administered Medications Ordered in Other Visits  Medication Dose Route Frequency Provider Last Rate Last Admin  . acetaminophen (TYLENOL) tablet 650 mg  650 mg Oral Once Nicholas Lose, MD        SURGICAL HISTORY:  Past Surgical History:  Procedure Laterality Date  . BRONCHIAL BIOPSY  11/23/2017   Procedure: BRONCHIAL BIOPSIES;  Surgeon: Grace Isaac, MD;  Location: [facility] OR;  Service: Thoracic;;  . cataract surgery  2007, 2008   repair bilat  .  DG BARIUM ENEMA ([place] HX) N/A 2016   [name]  . HERNIA REPAIR  04/25/2001  . HIP ARTHROPLASTY Left 05/31/2019   Procedure: LEFT HIP HEMIARTHROPLASTY;  Surgeon: Hiram Gash, MD;  Location: [facility];  Service: Orthopedics;  Laterality: Left;  . IR IMAGING GUIDED PORT INSERTION  07/05/2018  . IR US GUIDE BX ASP/DRAIN  11/17/2017  . KNEE SURGERY     meniscus tear per Dr. Ronnie Derby, R knee  . MULTIPLE TOOTH EXTRACTIONS    . SEPTOPLASTY  2006   Dr. Ernesto Rutherford  . SKIN CANCER EXCISION    . TONSILLECTOMY  1954  . VAGINAL HYSTERECTOMY    . VIDEO BRONCHOSCOPY WITH ENDOBRONCHIAL ULTRASOUND N/A 11/23/2017   Procedure: VIDEO BRONCHOSCOPY WITH ENDOBRONCHIAL ULTRASOUND;  Surgeon: Grace Isaac, MD;  Location: [facility];  Service: Thoracic;  Laterality: N/A;    REVIEW OF SYSTEMS:  A comprehensive review of systems was negative except for: Constitutional: positive for fatigue Respiratory: positive for dyspnea on exertion   PHYSICAL EXAMINATION: General appearance: alert, cooperative, fatigued and no distress Head: Normocephalic, without obvious abnormality, atraumatic Neck: no adenopathy, no JVD, supple, symmetrical, trachea midline and thyroid not enlarged, symmetric, no tenderness/mass/nodules Lymph nodes: Cervical, supraclavicular, and axillary nodes normal. Resp: clear to auscultation bilaterally Back: symmetric, no curvature. ROM normal. No CVA tenderness. Cardio: regular rate and rhythm, S1, S2 normal, no murmur, click, rub or gallop GI: soft, non-tender; bowel sounds normal; no masses,  no organomegaly Extremities: extremities normal, atraumatic, no cyanosis or edema  ECOG PERFORMANCE STATUS: 1 - Symptomatic but completely ambulatory  Blood pressure 139/66, pulse 81, temperature 97.8 F (36.6 C), temperature source Tympanic, resp. rate 18, height 5\' 7"  (1.702 m), weight 139 lb 12.8 oz (63.4 kg), SpO2 98 %.  LABORATORY DATA: Lab Results  Component Value Date   WBC 5.0 01/04/2020   HGB 8.6 (L)  01/04/2020   HCT 29.4 (L) 01/04/2020   MCV 89.9 01/04/2020   PLT 137 (L) 01/04/2020      Chemistry      Component Value Date/Time   NA 143 12/14/2019 0900   K 3.7 12/14/2019 0900   CL 107 12/14/2019 0900   CO2 28 12/14/2019 0900   BUN 25 (H) 12/14/2019 0900   CREATININE 1.44 (H) 12/14/2019 0900      Component Value Date/Time   CALCIUM 9.6 12/14/2019 0900   ALKPHOS 67 12/14/2019 0900   AST 17 12/14/2019 0900   ALT 8 12/14/2019 0900   BILITOT 0.3 12/14/2019 0900       RADIOGRAPHIC STUDIES: No results found.  ASSESSMENT AND PLAN: This is a very pleasant 86 years old white female with stage IIIb non-small cell lung cancer, squamous cell carcinoma.  She is currently undergoing a course of concurrent chemoradiation with weekly carboplatin and paclitaxel status post 6 cycles.  She tolerated this treatment well with partial response. The patient also completed the course of consolidation treatment with immunotherapy with  Imfinzi status post 26 cycles.  She tolerated her previous treatment well. Her recent CT scan of the chest showed progressive consolidation in the right lung suspicious for disease recurrence but also worsening post radiation effect could not be excluded.  A PET scan showed rounded hypermetabolic soft tissue mass inferior to the right hilum consistent with lung cancer recurrence.  There was also hypermetabolic precarinal lymph node concerning for nodal metastasis but no evidence for metastatic disease in the abdomen or pelvis.  The patient also had groundglass density in the right upper lobe, right middle lobe as well as left lower lobe suspicious for inflammatory process and less likely malignancy. The patient started treatment with systemic chemotherapy with carboplatin for AUC of 5, paclitaxel 175 mg/M2 and Keytruda 200 mg IV every 3 weeks with Neulasta support status post 12 cycles.  Starting from cycle #5 the patient is currently on maintenance treatment with single  agent Keytruda every 3 weeks.   The patient continues to tolerate her treatment fairly well with no concerning adverse effects. I recommended for her to proceed with cycle #13 today as planned. She will come back for follow-up visit in 3 weeks for evaluation with repeat CT scan of the chest, abdomen pelvis for restaging of her disease. She was advised to call immediately if she has any concerning symptoms in the interval. The patient voices understanding of current disease status and treatment options and is in agreement with the current care plan. All questions were answered. The patient knows to call the clinic with any problems, questions or concerns. We can certainly see the patient much sooner if necessary.  Disclaimer: This note was dictated with voice recognition software. Similar sounding words can inadvertently be transcribed and may not be corrected upon review.

## 2020-01-04 NOTE — Progress Notes (Signed)
Brief nutrition follow-up with patient receiving infusion for recurrent non-small cell lung cancer. Weight remains stable and was documented as 139.5 pounds.  Patient denies nutrition impact symptoms.  She continues to drink Ensure occasionally.  Unintended weight loss is resolved.  Encourage patient to continue strategies for weight maintenance including Ensure once a day.  I provided coupons.  Encourage patient to contact me if she has further questions or concerns.  Please consult RD if nutrition needs are identified.  **Disclaimer: This note was dictated with voice recognition software. Similar sounding words can inadvertently be transcribed and this note may contain transcription errors which may not have been corrected upon publication of note.**

## 2020-01-07 ENCOUNTER — Encounter: Payer: Self-pay | Admitting: Internal Medicine

## 2020-01-10 ENCOUNTER — Encounter: Payer: Self-pay | Admitting: Internal Medicine

## 2020-01-12 DIAGNOSIS — M6281 Muscle weakness (generalized): Secondary | ICD-10-CM | POA: Diagnosis not present

## 2020-01-12 DIAGNOSIS — S72002D Fracture of unspecified part of neck of left femur, subsequent encounter for closed fracture with routine healing: Secondary | ICD-10-CM | POA: Diagnosis not present

## 2020-01-12 DIAGNOSIS — I82409 Acute embolism and thrombosis of unspecified deep veins of unspecified lower extremity: Secondary | ICD-10-CM | POA: Diagnosis not present

## 2020-01-12 DIAGNOSIS — R2681 Unsteadiness on feet: Secondary | ICD-10-CM | POA: Diagnosis not present

## 2020-01-23 ENCOUNTER — Ambulatory Visit (HOSPITAL_COMMUNITY)
Admission: RE | Admit: 2020-01-23 | Discharge: 2020-01-23 | Disposition: A | Discharge status: Home / Self Care | Payer: Medicare Other | Source: Ambulatory Visit | Attending: Internal Medicine | Admitting: Internal Medicine

## 2020-01-23 ENCOUNTER — Other Ambulatory Visit: Payer: Self-pay

## 2020-01-23 DIAGNOSIS — C3490 Malignant neoplasm of unspecified part of unspecified bronchus or lung: Secondary | ICD-10-CM | POA: Diagnosis not present

## 2020-01-23 DIAGNOSIS — I712 Thoracic aortic aneurysm, without rupture: Secondary | ICD-10-CM | POA: Diagnosis not present

## 2020-01-23 DIAGNOSIS — N281 Cyst of kidney, acquired: Secondary | ICD-10-CM | POA: Diagnosis not present

## 2020-01-23 DIAGNOSIS — M4696 Unspecified inflammatory spondylopathy, lumbar region: Secondary | ICD-10-CM | POA: Diagnosis not present

## 2020-01-23 DIAGNOSIS — M47814 Spondylosis without myelopathy or radiculopathy, thoracic region: Secondary | ICD-10-CM | POA: Diagnosis not present

## 2020-01-24 ENCOUNTER — Other Ambulatory Visit: Payer: Self-pay | Admitting: Physician Assistant

## 2020-01-24 ENCOUNTER — Inpatient Hospital Stay: Payer: Medicare Other

## 2020-01-24 ENCOUNTER — Inpatient Hospital Stay: Payer: Medicare Other | Admitting: Internal Medicine

## 2020-01-24 ENCOUNTER — Other Ambulatory Visit: Payer: Self-pay | Admitting: Medical Oncology

## 2020-01-24 ENCOUNTER — Encounter: Payer: Self-pay | Admitting: Internal Medicine

## 2020-01-24 VITALS — BP 145/78 | HR 77 | Temp 98.3°F | Resp 18

## 2020-01-24 VITALS — BP 132/83 | HR 78 | Temp 96.6°F | Resp 17 | Ht 67.0 in | Wt 138.3 lb

## 2020-01-24 DIAGNOSIS — D649 Anemia, unspecified: Secondary | ICD-10-CM

## 2020-01-24 DIAGNOSIS — C3491 Malignant neoplasm of unspecified part of right bronchus or lung: Secondary | ICD-10-CM | POA: Diagnosis not present

## 2020-01-24 DIAGNOSIS — Z923 Personal history of irradiation: Secondary | ICD-10-CM | POA: Diagnosis not present

## 2020-01-24 DIAGNOSIS — Z95828 Presence of other vascular implants and grafts: Secondary | ICD-10-CM

## 2020-01-24 DIAGNOSIS — R5383 Other fatigue: Secondary | ICD-10-CM | POA: Diagnosis not present

## 2020-01-24 DIAGNOSIS — Z5112 Encounter for antineoplastic immunotherapy: Secondary | ICD-10-CM | POA: Diagnosis not present

## 2020-01-24 DIAGNOSIS — Z85820 Personal history of malignant melanoma of skin: Secondary | ICD-10-CM | POA: Diagnosis not present

## 2020-01-24 DIAGNOSIS — Z9221 Personal history of antineoplastic chemotherapy: Secondary | ICD-10-CM | POA: Diagnosis not present

## 2020-01-24 DIAGNOSIS — D638 Anemia in other chronic diseases classified elsewhere: Secondary | ICD-10-CM | POA: Diagnosis not present

## 2020-01-24 DIAGNOSIS — R0609 Other forms of dyspnea: Secondary | ICD-10-CM | POA: Diagnosis not present

## 2020-01-24 DIAGNOSIS — I10 Essential (primary) hypertension: Secondary | ICD-10-CM

## 2020-01-24 DIAGNOSIS — E785 Hyperlipidemia, unspecified: Secondary | ICD-10-CM | POA: Diagnosis not present

## 2020-01-24 DIAGNOSIS — C342 Malignant neoplasm of middle lobe, bronchus or lung: Secondary | ICD-10-CM | POA: Diagnosis not present

## 2020-01-24 DIAGNOSIS — E039 Hypothyroidism, unspecified: Secondary | ICD-10-CM | POA: Diagnosis not present

## 2020-01-24 DIAGNOSIS — Z79899 Other long term (current) drug therapy: Secondary | ICD-10-CM | POA: Diagnosis not present

## 2020-01-24 LAB — CBC WITH DIFFERENTIAL (CANCER CENTER ONLY)
Abs Immature Granulocytes: 0.01 10*3/uL (ref 0.00–0.07)
Basophils Absolute: 0 10*3/uL (ref 0.0–0.1)
Basophils Relative: 0 %
Eosinophils Absolute: 0.1 10*3/uL (ref 0.0–0.5)
Eosinophils Relative: 1 %
HCT: 27 % — ABNORMAL LOW (ref 36.0–46.0)
Hemoglobin: 8.1 g/dL — ABNORMAL LOW (ref 12.0–15.0)
Immature Granulocytes: 0 %
Lymphocytes Relative: 13 %
Lymphs Abs: 0.6 10*3/uL — ABNORMAL LOW (ref 0.7–4.0)
MCH: 26.6 pg (ref 26.0–34.0)
MCHC: 30 g/dL (ref 30.0–36.0)
MCV: 88.5 fL (ref 80.0–100.0)
Monocytes Absolute: 0.3 10*3/uL (ref 0.1–1.0)
Monocytes Relative: 6 %
Neutro Abs: 3.9 10*3/uL (ref 1.7–7.7)
Neutrophils Relative %: 80 %
Platelet Count: 139 10*3/uL — ABNORMAL LOW (ref 150–400)
RBC: 3.05 MIL/uL — ABNORMAL LOW (ref 3.87–5.11)
RDW: 15.9 % — ABNORMAL HIGH (ref 11.5–15.5)
WBC Count: 4.9 10*3/uL (ref 4.0–10.5)
nRBC: 0 % (ref 0.0–0.2)

## 2020-01-24 LAB — CMP (CANCER CENTER ONLY)
ALT: 8 U/L (ref 0–44)
AST: 15 U/L (ref 15–41)
Albumin: 3.1 g/dL — ABNORMAL LOW (ref 3.5–5.0)
Alkaline Phosphatase: 65 U/L (ref 38–126)
Anion gap: 7 (ref 5–15)
BUN: 27 mg/dL — ABNORMAL HIGH (ref 8–23)
CO2: 28 mmol/L (ref 22–32)
Calcium: 9.8 mg/dL (ref 8.9–10.3)
Chloride: 107 mmol/L (ref 98–111)
Creatinine: 1.61 mg/dL — ABNORMAL HIGH (ref 0.44–1.00)
GFR, Estimated: 31 mL/min — ABNORMAL LOW (ref >60)
Glucose, Bld: 93 mg/dL (ref 70–99)
Potassium: 3.8 mmol/L (ref 3.5–5.1)
Sodium: 142 mmol/L (ref 135–145)
Total Bilirubin: 0.4 mg/dL (ref 0.3–1.2)
Total Protein: 6.7 g/dL (ref 6.5–8.1)

## 2020-01-24 LAB — PREPARE RBC (CROSSMATCH)

## 2020-01-24 LAB — TSH: TSH: 4.704 u[IU]/mL — ABNORMAL HIGH (ref 0.308–3.960)

## 2020-01-24 MED ORDER — ALTEPLASE 2 MG IJ SOLR
2.0000 mg | Freq: Once | INTRAMUSCULAR | Status: AC
  Administered 2020-01-24: 2 mg
  Filled 2020-01-24: qty 2

## 2020-01-24 MED ORDER — SODIUM CHLORIDE 0.9 % IV SOLN
200.0000 mg | Freq: Once | INTRAVENOUS | Status: AC
  Administered 2020-01-24: 200 mg via INTRAVENOUS
  Filled 2020-01-24: qty 8

## 2020-01-24 MED ORDER — SODIUM CHLORIDE 0.9 % IV SOLN
Freq: Once | INTRAVENOUS | Status: AC
  Filled 2020-01-24: qty 250

## 2020-01-24 MED ORDER — ALTEPLASE 2 MG IJ SOLR
INTRAMUSCULAR | Status: AC
  Filled 2020-01-24: qty 2

## 2020-01-24 MED ORDER — SODIUM CHLORIDE 0.9% IV SOLUTION
250.0000 mL | Freq: Once | INTRAVENOUS | Status: AC
  Administered 2020-01-24: 250 mL via INTRAVENOUS
  Filled 2020-01-24: qty 250

## 2020-01-24 MED ORDER — SODIUM CHLORIDE 0.9% FLUSH
10.0000 mL | INTRAVENOUS | Status: AC | PRN
  Administered 2020-01-24: 10 mL
  Filled 2020-01-24: qty 10

## 2020-01-24 MED ORDER — DIPHENHYDRAMINE HCL 25 MG PO CAPS
25.0000 mg | ORAL_CAPSULE | Freq: Once | ORAL | Status: AC
  Administered 2020-01-24: 25 mg via ORAL

## 2020-01-24 MED ORDER — ACETAMINOPHEN 325 MG PO TABS
650.0000 mg | ORAL_TABLET | Freq: Once | ORAL | Status: AC
  Administered 2020-01-24: 650 mg via ORAL

## 2020-01-24 MED ORDER — DIPHENHYDRAMINE HCL 25 MG PO CAPS
ORAL_CAPSULE | ORAL | Status: AC
  Filled 2020-01-24: qty 1

## 2020-01-24 MED ORDER — HEPARIN SOD (PORK) LOCK FLUSH 100 UNIT/ML IV SOLN
500.0000 [IU] | Freq: Every day | INTRAVENOUS | Status: AC | PRN
  Administered 2020-01-24: 500 [IU]
  Filled 2020-01-24: qty 5

## 2020-01-24 MED ORDER — ACETAMINOPHEN 325 MG PO TABS
ORAL_TABLET | ORAL | Status: AC
  Filled 2020-01-24: qty 2

## 2020-01-24 MED ORDER — SODIUM CHLORIDE 0.9% FLUSH
10.0000 mL | Freq: Once | INTRAVENOUS | Status: AC
  Administered 2020-01-24: 10 mL
  Filled 2020-01-24: qty 10

## 2020-01-24 NOTE — Progress Notes (Addendum)
Per Dr Julien Nordmann it is okay to treat pt today with  [place] and creatinine of 1.61.

## 2020-01-24 NOTE — Addendum Note (Signed)
Addended by: Ardeen Garland on: 01/24/2020 01:10 PM   Modules accepted: Orders, SmartSet

## 2020-01-24 NOTE — Progress Notes (Signed)
[facility] [phone]   [phone]  OFFICE PROGRESS NOTE  [name], MD [place] [address]  DIAGNOSIS: Recurrent non-small cell lung cancer initially diagnosed stage IIIB (T3, N3, M0) non-small cell lung cancer, squamous cell carcinoma presented with large right middle lobe lung mass in addition to right hilar, subcarinal and right supraclavicular lymphadenopathy diagnosed in August 2019.  The patient had evidence for disease progression in December 2020.  PRIOR THERAPY:  1) Concurrent chemoradiation with weekly carboplatin for AUC of 2 and paclitaxel 45 NG/M2.  Status post 6 cycles with partial response. 2) Consolidation immunotherapy with Imfinzi 10 mg/KG every 2 weeks started February 28, 2018.  Status post 26 cycles.  Last cycle was given on February 21, 2019  CURRENT THERAPY: Systemic chemotherapy with carboplatin for AUC of 5, paclitaxel 175 mg/M2 and Keytruda 200 mg IV every 3 weeks.  First dose April 03, 2018.  Status post 13 cycles.  INTERVAL HISTORY: Lisa [name] 86 y.o. female returns to the clinic today for follow-up visit.  The patient is feeling fine today with no concerning complaints except for fatigue.  She denied having any chest pain but has shortness of breath with exertion with no cough or hemoptysis.  She denied having any fever or chills.  She has no nausea, vomiting, diarrhea or constipation.  She has no headache or visual changes.  She continues to tolerate her treatment with Keytruda fairly well.  She had repeat CT scan of the chest, abdomen pelvis performed yesterday and she is here for evaluation and discussion of her scan results before the next cycle of her treatment.  MEDICAL HISTORY: Past Medical History:  Diagnosis Date  . Diverticulosis   . Headache   . Hemorrhoids    internal and external  . Hyperlipidemia 01/29/1991  . Hypertension 10/29/1995  . Hypothyroidism 10/29/1995  .  Lung mass    right middle lobe  . Melanoma ([place])    h/o, local excision. no chemo or rady.   . Skin cancer (melanoma) ([place])   . Tubular adenoma of colon   . Wears dentures     ALLERGIES:  is allergic to atorvastatin, celebrex [celecoxib], cholecalciferol, simvastatin, and tape.  MEDICATIONS:  Current Outpatient Medications  Medication Sig Dispense Refill  . albuterol (VENTOLIN HFA) 108 (90 Base) MCG/ACT inhaler Inhale 2 puffs into the lungs every 6 (six) hours as needed for wheezing or shortness of breath. 6.7 g 0  . amLODipine (NORVASC) 10 MG tablet Take 1 tablet by mouth once daily 90 tablet 0  . Cholecalciferol (VITAMIN D3) 50 MCG (2000 UT) TABS Take 2,000 Units by mouth 3 (three) times a week.    . Coenzyme Q10 100 MG capsule Take 100 mg by mouth daily.     Marland Kitchen docusate sodium (COLACE) 100 MG capsule Take 100 mg by mouth daily as needed for mild constipation.    . fluticasone (FLONASE) 50 MCG/ACT nasal spray USE 2 SPRAYS IN EACH NOSTRIL DAILY AS NEEDED FOR ALLERGIES 48 g 3  . levothyroxine (SYNTHROID) 75 MCG tablet Take 1 tablet by mouth once daily 90 tablet 0  . loratadine (CLARITIN) 10 MG tablet Take 1 tablet (10 mg total) by mouth daily as needed for allergies or rhinitis.    . mirtazapine (REMERON) 7.5 MG tablet TAKE 1 TABLET BY MOUTH ONCE DAILY AT BEDTIME 30 tablet 2  . Multiple Vitamin (MULTIVITAMIN WITH MINERALS) TABS tablet Take 1 tablet  by mouth daily.    . sennosides-docusate sodium (SENOKOT-S) 8.6-50 MG tablet Take 1 tablet by mouth daily as needed for constipation.     No current facility-administered medications for this visit.   Facility-Administered Medications Ordered in Other Visits  Medication Dose Route Frequency Provider Last Rate Last Admin  . acetaminophen (TYLENOL) tablet 650 mg  650 mg Oral Once Nicholas Lose, MD        SURGICAL HISTORY:  Past Surgical History:  Procedure Laterality Date  . BRONCHIAL BIOPSY  11/23/2017   Procedure: BRONCHIAL BIOPSIES;   Surgeon: Grace Isaac, MD;  Location: [facility] OR;  Service: Thoracic;;  . cataract surgery  2007, 2008   repair bilat  . DG BARIUM ENEMA ([place] HX) N/A 2016   [name]  . HERNIA REPAIR  04/25/2001  . HIP ARTHROPLASTY Left 05/31/2019   Procedure: LEFT HIP HEMIARTHROPLASTY;  Surgeon: Hiram Gash, MD;  Location: [facility];  Service: Orthopedics;  Laterality: Left;  . IR IMAGING GUIDED PORT INSERTION  07/05/2018  . IR US GUIDE BX ASP/DRAIN  11/17/2017  . KNEE SURGERY     meniscus tear per Dr. Ronnie Derby, R knee  . MULTIPLE TOOTH EXTRACTIONS    . SEPTOPLASTY  2006   Dr. Ernesto Rutherford  . SKIN CANCER EXCISION    . TONSILLECTOMY  1954  . VAGINAL HYSTERECTOMY    . VIDEO BRONCHOSCOPY WITH ENDOBRONCHIAL ULTRASOUND N/A 11/23/2017   Procedure: VIDEO BRONCHOSCOPY WITH ENDOBRONCHIAL ULTRASOUND;  Surgeon: Grace Isaac, MD;  Location: [facility];  Service: Thoracic;  Laterality: N/A;    REVIEW OF SYSTEMS:  Constitutional: positive for fatigue Eyes: negative Ears, nose, mouth, throat, and face: negative Respiratory: positive for dyspnea on exertion Cardiovascular: negative Gastrointestinal: negative Genitourinary:negative Integument/breast: negative Hematologic/lymphatic: negative Musculoskeletal:negative Neurological: negative Behavioral/Psych: negative Endocrine: negative Allergic/Immunologic: negative   PHYSICAL EXAMINATION: General appearance: alert, cooperative, fatigued and no distress Head: Normocephalic, without obvious abnormality, atraumatic Neck: no adenopathy, no JVD, supple, symmetrical, trachea midline and thyroid not enlarged, symmetric, no tenderness/mass/nodules Lymph nodes: Cervical, supraclavicular, and axillary nodes normal. Resp: clear to auscultation bilaterally Back: symmetric, no curvature. ROM normal. No CVA tenderness. Cardio: regular rate and rhythm, S1, S2 normal, no murmur, click, rub or gallop GI: soft, non-tender; bowel sounds normal; no masses,  no  organomegaly Extremities: extremities normal, atraumatic, no cyanosis or edema Neurologic: Alert and oriented X 3, normal strength and tone. Normal symmetric reflexes. Normal coordination and gait  ECOG PERFORMANCE STATUS: 1 - Symptomatic but completely ambulatory  Blood pressure 132/83, pulse 78, temperature (!) 96.6 F (35.9 C), temperature source Tympanic, resp. rate 17, height 5\' 7"  (1.702 m), weight 138 lb 4.8 oz (62.7 kg), SpO2 100 %.  LABORATORY DATA: Lab Results  Component Value Date   WBC 4.9 01/24/2020   HGB 8.1 (L) 01/24/2020   HCT 27.0 (L) 01/24/2020   MCV 88.5 01/24/2020   PLT 139 (L) 01/24/2020      Chemistry      Component Value Date/Time   NA 143 01/04/2020 0947   K 3.5 01/04/2020 0947   CL 107 01/04/2020 0947   CO2 30 01/04/2020 0947   BUN 21 01/04/2020 0947   CREATININE 1.56 (H) 01/04/2020 0947      Component Value Date/Time   CALCIUM 10.0 01/04/2020 0947   ALKPHOS 66 01/04/2020 0947   AST 18 01/04/2020 0947   ALT 11 01/04/2020 0947   BILITOT 0.4 01/04/2020 0947       RADIOGRAPHIC STUDIES: CT Abdomen Pelvis Wo Contrast  Result Date: 01/23/2020 CLINICAL DATA:  Restaging right sided non-small cell lung cancer. EXAM: CT CHEST, ABDOMEN AND PELVIS WITHOUT CONTRAST TECHNIQUE: Multidetector CT imaging of the chest, abdomen and pelvis was performed following the standard protocol without IV contrast. COMPARISON:  11/07/2019 FINDINGS: CT CHEST FINDINGS Cardiovascular: 4.3 cm ascending thoracic aortic aneurysm is unchanged, image 33/2. Mild cardiac enlargement. Small pericardial effusion, similar to previous exam. Aortic atherosclerosis. Mild coronary artery atherosclerotic calcifications. Mediastinum/Nodes: No supraclavicular or axillary adenopathy. No mediastinal adenopathy. The hilar lymph nodes are suboptimally evaluated due to lack of IV contrast material. Normal appearance of the thyroid gland. The trachea appears patent and is midline. Normal appearance of  the esophagus. Lungs/Pleura: Similar appearance of moderate volume, partially loculated right pleural effusion. Within the limitations of unenhanced technique there has been no significant interval change in the appearance of the right hilar lung mass which measures 6.6 by 5.1 cm, image 32/2. On the coronal images this measures approximately 6.2 cm cranial caudal, image 58/5, also similar to previous exam. Masslike architectural distortion and fibrosis within the paramediastinal right lung is similar to previous exam, which likely reflect changes due to external beam radiation. Scattered tiny less than 5 mm subpleural lung nodules are again noted predominantly in the right upper lobe and appear unchanged. Similar appearance of interstitial reticulation and scarring within the left lung base. Musculoskeletal: Multi level spondylosis identified within the thoracic spine. Large Schmorl's node deformity involving the inferior endplate of Z66 is unchanged. No acute or suspicious osseous findings. CT ABDOMEN PELVIS FINDINGS Hepatobiliary: No focal liver abnormality is seen. No gallstones, gallbladder wall thickening, or biliary dilatation. Pancreas: Unremarkable. No pancreatic ductal dilatation or surrounding inflammatory changes. Spleen: Normal in size without focal abnormality. Adrenals/Urinary Tract: Normal appearance of the adrenal glands. Left pelvic kidney with large cyst appears similar to previous exam. Additional, bilateral kidney cysts are noted, incompletely characterized without IV contrast. No hydronephrosis identified at this time. Stomach/Bowel: Stomach is within normal limits. Appendix appears normal. No evidence of bowel wall thickening, distention, or inflammatory changes. Numerous sigmoid diverticula noted without acute inflammation. Vascular/Lymphatic: Aortic atherosclerosis. No aneurysm. No abdominopelvic adenopathy. Reproductive: Status post hysterectomy. No adnexal masses. Other: No abdominal wall  hernia or abnormality. No abdominopelvic ascites. Musculoskeletal: Previous left hip arthroplasty. Extensive lumbar degenerative disc disease. Bilateral lumbar facet arthropathy is also noted. Curvature of the lumbar spine appears convex towards the left. IMPRESSION: 1. No significant interval change in size of right hilar lung mass with surrounding changes of external beam radiation. 2. Unchanged moderate volume, partially loculated right pleural effusion. 3. Unchanged 4.3 cm ascending thoracic aortic aneurysm. Recommend annual imaging followup by CTA or MRA. This recommendation follows 2010 ACCF/AHA/AATS/ACR/ASA/SCA/SCAI/SIR/STS/SVM Guidelines for the Diagnosis and Management of Patients with Thoracic Aortic Disease. Circulation. 2010; 121: A630-Z601. Aortic aneurysm NOS (ICD10-I71.9) 4. Aortic atherosclerosis and coronary artery atherosclerotic calcifications. Aortic Atherosclerosis (ICD10-I70.0). Electronically Signed   By: Kerby Moors M.D.   On: 01/23/2020 16:49   CT Chest Wo Contrast  Result Date: 01/23/2020 CLINICAL DATA:  Restaging right sided non-small cell lung cancer. EXAM: CT CHEST, ABDOMEN AND PELVIS WITHOUT CONTRAST TECHNIQUE: Multidetector CT imaging of the chest, abdomen and pelvis was performed following the standard protocol without IV contrast. COMPARISON:  11/07/2019 FINDINGS: CT CHEST FINDINGS Cardiovascular: 4.3 cm ascending thoracic aortic aneurysm is unchanged, image 33/2. Mild cardiac enlargement. Small pericardial effusion, similar to previous exam. Aortic atherosclerosis. Mild coronary artery atherosclerotic calcifications. Mediastinum/Nodes: No supraclavicular or axillary adenopathy. No mediastinal adenopathy. The hilar lymph  nodes are suboptimally evaluated due to lack of IV contrast material. Normal appearance of the thyroid gland. The trachea appears patent and is midline. Normal appearance of the esophagus. Lungs/Pleura: Similar appearance of moderate volume, partially  loculated right pleural effusion. Within the limitations of unenhanced technique there has been no significant interval change in the appearance of the right hilar lung mass which measures 6.6 by 5.1 cm, image 32/2. On the coronal images this measures approximately 6.2 cm cranial caudal, image 58/5, also similar to previous exam. Masslike architectural distortion and fibrosis within the paramediastinal right lung is similar to previous exam, which likely reflect changes due to external beam radiation. Scattered tiny less than 5 mm subpleural lung nodules are again noted predominantly in the right upper lobe and appear unchanged. Similar appearance of interstitial reticulation and scarring within the left lung base. Musculoskeletal: Multi level spondylosis identified within the thoracic spine. Large Schmorl's node deformity involving the inferior endplate of X54 is unchanged. No acute or suspicious osseous findings. CT ABDOMEN PELVIS FINDINGS Hepatobiliary: No focal liver abnormality is seen. No gallstones, gallbladder wall thickening, or biliary dilatation. Pancreas: Unremarkable. No pancreatic ductal dilatation or surrounding inflammatory changes. Spleen: Normal in size without focal abnormality. Adrenals/Urinary Tract: Normal appearance of the adrenal glands. Left pelvic kidney with large cyst appears similar to previous exam. Additional, bilateral kidney cysts are noted, incompletely characterized without IV contrast. No hydronephrosis identified at this time. Stomach/Bowel: Stomach is within normal limits. Appendix appears normal. No evidence of bowel wall thickening, distention, or inflammatory changes. Numerous sigmoid diverticula noted without acute inflammation. Vascular/Lymphatic: Aortic atherosclerosis. No aneurysm. No abdominopelvic adenopathy. Reproductive: Status post hysterectomy. No adnexal masses. Other: No abdominal wall hernia or abnormality. No abdominopelvic ascites. Musculoskeletal: Previous  left hip arthroplasty. Extensive lumbar degenerative disc disease. Bilateral lumbar facet arthropathy is also noted. Curvature of the lumbar spine appears convex towards the left. IMPRESSION: 1. No significant interval change in size of right hilar lung mass with surrounding changes of external beam radiation. 2. Unchanged moderate volume, partially loculated right pleural effusion. 3. Unchanged 4.3 cm ascending thoracic aortic aneurysm. Recommend annual imaging followup by CTA or MRA. This recommendation follows 2010 ACCF/AHA/AATS/ACR/ASA/SCA/SCAI/SIR/STS/SVM Guidelines for the Diagnosis and Management of Patients with Thoracic Aortic Disease. Circulation. 2010; 121: M086-P619. Aortic aneurysm NOS (ICD10-I71.9) 4. Aortic atherosclerosis and coronary artery atherosclerotic calcifications. Aortic Atherosclerosis (ICD10-I70.0). Electronically Signed   By: Kerby Moors M.D.   On: 01/23/2020 16:49    ASSESSMENT AND PLAN: This is a very pleasant 86 years old white female with stage IIIb non-small cell lung cancer, squamous cell carcinoma.  She is currently undergoing a course of concurrent chemoradiation with weekly carboplatin and paclitaxel status post 6 cycles.  She tolerated this treatment well with partial response. The patient also completed the course of consolidation treatment with immunotherapy with Imfinzi status post 26 cycles.  She tolerated her previous treatment well. Her recent CT scan of the chest showed progressive consolidation in the right lung suspicious for disease recurrence but also worsening post radiation effect could not be excluded.  A PET scan showed rounded hypermetabolic soft tissue mass inferior to the right hilum consistent with lung cancer recurrence.  There was also hypermetabolic precarinal lymph node concerning for nodal metastasis but no evidence for metastatic disease in the abdomen or pelvis.  The patient also had groundglass density in the right upper lobe, right middle lobe  as well as left lower lobe suspicious for inflammatory process and less likely malignancy. The patient  started treatment with systemic chemotherapy with carboplatin for AUC of 5, paclitaxel 175 mg/M2 and Keytruda 200 mg IV every 3 weeks with Neulasta support status post 13 cycles.  Starting from cycle #5 the patient is currently on maintenance treatment with single agent Keytruda every 3 weeks.   The patient has been tolerating her maintenance treatment with Keytruda fairly well with no concerning adverse effects. She had repeat CT scan of the chest, abdomen pelvis performed recently.  I personally and independently reviewed the scans and discussed the results with the patient today. Her scan showed no concerning findings for disease progression. I recommended for her to continue her current treatment with [facility] and she will proceed with cycle #14 today. For the anemia of chronic disease, I will arrange for the patient to receive 2 units of PRBCs transfusion this week. She will come back for follow-up visit in 3 weeks for evaluation before the next cycle of her treatment. The patient was advised to call immediately if she has any concerning symptoms in the interval. The patient voices understanding of current disease status and treatment options and is in agreement with the current care plan. All questions were answered. The patient knows to call the clinic with any problems, questions or concerns. We can certainly see the patient much sooner if necessary.  Disclaimer: This note was dictated with voice recognition software. Similar sounding words can inadvertently be transcribed and may not be corrected upon review.

## 2020-01-24 NOTE — Patient Instructions (Signed)

## 2020-01-24 NOTE — Patient Instructions (Signed)
[facility] Discharge Instructions for Patients Receiving Chemotherapy  Today you received the following immunotherapy agent: Pembrolizumab Beryle Flock)  To help prevent nausea and vomiting after your treatment, we encourage you to take your nausea medication as directed by your MD.   If you develop nausea and vomiting that is not controlled by your nausea medication, call the clinic.   BELOW ARE SYMPTOMS THAT SHOULD BE REPORTED IMMEDIATELY:  *FEVER GREATER THAN 100.5 F  *CHILLS WITH OR WITHOUT FEVER  NAUSEA AND VOMITING THAT IS NOT CONTROLLED WITH YOUR NAUSEA MEDICATION  *UNUSUAL SHORTNESS OF BREATH  *UNUSUAL BRUISING OR BLEEDING  TENDERNESS IN MOUTH AND THROAT WITH OR WITHOUT PRESENCE OF ULCERS  *URINARY PROBLEMS  *BOWEL PROBLEMS  UNUSUAL RASH Items with * indicate a potential emergency and should be followed up as soon as possible.  Feel free to call the clinic should you have any questions or concerns. The clinic phone number is [phone].  Please show the [facility] at check-in to the Emergency Department and triage nurse.   Blood Transfusion, Adult, Care After This sheet gives you information about how to care for yourself after your procedure. Your doctor may also give you more specific instructions. If you have problems or questions, contact your doctor. What can I expect after the procedure? After the procedure, it is common to have:  Bruising and soreness at the IV site.  A fever or chills on the day of the procedure. This may be your body's response to the new blood cells received.  A headache. Follow these instructions at home: Insertion site care      Follow instructions from your doctor about how to take care of your insertion site. This is where an IV tube was put into your vein. Make sure you: ? Wash your hands with soap and water before and after you change your bandage (dressing). If you cannot use soap and water, use hand  sanitizer. ? Change your bandage as told by your doctor.  Check your insertion site every day for signs of infection. Check for: ? Redness, swelling, or pain. ? Bleeding from the site. ? Warmth. ? Pus or a bad smell. General instructions  Take over-the-counter and prescription medicines only as told by your doctor.  Rest as told by your doctor.  Go back to your normal activities as told by your doctor.  Keep all follow-up visits as told by your doctor. This is important. Contact a doctor if:  You have itching or red, swollen areas of skin (hives).  You feel worried or nervous (anxious).  You feel weak after doing your normal activities.  You have redness, swelling, warmth, or pain around the insertion site.  You have blood coming from the insertion site, and the blood does not stop with pressure.  You have pus or a bad smell coming from the insertion site. Get help right away if:  You have signs of a serious reaction. This may be coming from an allergy or the body's defense system (immune system). Signs include: ? Trouble breathing or shortness of breath. ? Swelling of the face or feeling warm (flushed). ? Fever or chills. ? Head, chest, or back pain. ? Dark pee (urine) or blood in the pee. ? Widespread rash. ? Fast heartbeat. ? Feeling dizzy or light-headed. You may receive your blood transfusion in an outpatient setting. If so, you will be told whom to contact to report any reactions. These symptoms may be an emergency. Do  not wait to see if the symptoms will go away. Get medical help right away. Call your local emergency services (911 in the [place] Do not drive yourself to the hospital. Summary  Bruising and soreness at the IV site are common.  Check your insertion site every day for signs of infection.  Rest as told by your doctor. Go back to your normal activities as told by your doctor.  Get help right away if you have signs of a serious reaction. This  information is not intended to replace advice given to you by your health care provider. Make sure you discuss any questions you have with your health care provider. Document Revised: 09/08/2018 Document Reviewed: 09/08/2018 Elsevier Patient Education  [place].

## 2020-01-24 NOTE — Addendum Note (Signed)
Addended by: Ardeen Garland on: 01/24/2020 01:28 PM   Modules accepted: Orders

## 2020-01-25 ENCOUNTER — Other Ambulatory Visit: Payer: Self-pay | Admitting: Medical Oncology

## 2020-01-25 ENCOUNTER — Inpatient Hospital Stay: Payer: Medicare Other

## 2020-01-25 ENCOUNTER — Other Ambulatory Visit: Payer: Self-pay

## 2020-01-25 DIAGNOSIS — R0609 Other forms of dyspnea: Secondary | ICD-10-CM | POA: Diagnosis not present

## 2020-01-25 DIAGNOSIS — C342 Malignant neoplasm of middle lobe, bronchus or lung: Secondary | ICD-10-CM | POA: Diagnosis not present

## 2020-01-25 DIAGNOSIS — I10 Essential (primary) hypertension: Secondary | ICD-10-CM | POA: Diagnosis not present

## 2020-01-25 DIAGNOSIS — E039 Hypothyroidism, unspecified: Secondary | ICD-10-CM | POA: Diagnosis not present

## 2020-01-25 DIAGNOSIS — Z79899 Other long term (current) drug therapy: Secondary | ICD-10-CM | POA: Diagnosis not present

## 2020-01-25 DIAGNOSIS — D638 Anemia in other chronic diseases classified elsewhere: Secondary | ICD-10-CM | POA: Diagnosis not present

## 2020-01-25 DIAGNOSIS — R5383 Other fatigue: Secondary | ICD-10-CM | POA: Diagnosis not present

## 2020-01-25 DIAGNOSIS — D649 Anemia, unspecified: Secondary | ICD-10-CM

## 2020-01-25 DIAGNOSIS — Z5112 Encounter for antineoplastic immunotherapy: Secondary | ICD-10-CM | POA: Diagnosis not present

## 2020-01-25 DIAGNOSIS — Z85820 Personal history of malignant melanoma of skin: Secondary | ICD-10-CM | POA: Diagnosis not present

## 2020-01-25 DIAGNOSIS — C3491 Malignant neoplasm of unspecified part of right bronchus or lung: Secondary | ICD-10-CM

## 2020-01-25 DIAGNOSIS — E785 Hyperlipidemia, unspecified: Secondary | ICD-10-CM | POA: Diagnosis not present

## 2020-01-25 DIAGNOSIS — Z9221 Personal history of antineoplastic chemotherapy: Secondary | ICD-10-CM | POA: Diagnosis not present

## 2020-01-25 DIAGNOSIS — Z923 Personal history of irradiation: Secondary | ICD-10-CM | POA: Diagnosis not present

## 2020-01-25 LAB — PREPARE RBC (CROSSMATCH)

## 2020-01-25 MED ORDER — SODIUM CHLORIDE 0.9% FLUSH
10.0000 mL | INTRAVENOUS | Status: AC | PRN
  Administered 2020-01-25: 10 mL
  Filled 2020-01-25: qty 10

## 2020-01-25 MED ORDER — ACETAMINOPHEN 325 MG PO TABS
ORAL_TABLET | ORAL | Status: AC
  Filled 2020-01-25: qty 2

## 2020-01-25 MED ORDER — ACETAMINOPHEN 325 MG PO TABS
650.0000 mg | ORAL_TABLET | Freq: Once | ORAL | Status: AC
  Administered 2020-01-25: 650 mg via ORAL

## 2020-01-25 MED ORDER — HEPARIN SOD (PORK) LOCK FLUSH 100 UNIT/ML IV SOLN
500.0000 [IU] | Freq: Every day | INTRAVENOUS | Status: AC | PRN
  Administered 2020-01-25: 500 [IU]
  Filled 2020-01-25: qty 5

## 2020-01-25 MED ORDER — SODIUM CHLORIDE 0.9% IV SOLUTION
250.0000 mL | Freq: Once | INTRAVENOUS | Status: AC
  Administered 2020-01-25: 250 mL via INTRAVENOUS
  Filled 2020-01-25: qty 250

## 2020-01-25 MED ORDER — DIPHENHYDRAMINE HCL 25 MG PO CAPS
ORAL_CAPSULE | ORAL | Status: AC
  Filled 2020-01-25: qty 1

## 2020-01-25 MED ORDER — DIPHENHYDRAMINE HCL 25 MG PO CAPS
25.0000 mg | ORAL_CAPSULE | Freq: Once | ORAL | Status: AC
  Administered 2020-01-25: 25 mg via ORAL

## 2020-01-25 NOTE — Patient Instructions (Signed)

## 2020-01-25 NOTE — Progress Notes (Signed)
Okay to run 1 unit PRBC's at 382mL/hr per Cassie PA.

## 2020-01-26 ENCOUNTER — Ambulatory Visit: Payer: Medicare Other

## 2020-01-26 LAB — TYPE AND SCREEN
ABO/RH(D): O NEG
ABO/RH(D): O NEG
Antibody Screen: NEGATIVE
Antibody Screen: NEGATIVE
Unit division: 0
Unit division: 0
Unit division: 0

## 2020-01-26 LAB — BPAM RBC
Blood Product Expiration Date: 202111212359
Blood Product Expiration Date: 202111212359
Blood Product Expiration Date: 202111292359
ISSUE DATE / TIME: 202110271537
ISSUE DATE / TIME: 202110281555
Unit Type and Rh: 9500
Unit Type and Rh: 9500
Unit Type and Rh: 5100

## 2020-01-30 DIAGNOSIS — C44329 Squamous cell carcinoma of skin of other parts of face: Secondary | ICD-10-CM | POA: Diagnosis not present

## 2020-02-12 DIAGNOSIS — S72002D Fracture of unspecified part of neck of left femur, subsequent encounter for closed fracture with routine healing: Secondary | ICD-10-CM | POA: Diagnosis not present

## 2020-02-12 DIAGNOSIS — M6281 Muscle weakness (generalized): Secondary | ICD-10-CM | POA: Diagnosis not present

## 2020-02-12 DIAGNOSIS — I82409 Acute embolism and thrombosis of unspecified deep veins of unspecified lower extremity: Secondary | ICD-10-CM | POA: Diagnosis not present

## 2020-02-12 DIAGNOSIS — R2681 Unsteadiness on feet: Secondary | ICD-10-CM | POA: Diagnosis not present

## 2020-02-13 NOTE — Progress Notes (Signed)
[facility] OFFICE PROGRESS NOTE  [name], MD [place] [address]  DIAGNOSIS: Recurrent non-small cell lung cancer initially diagnosed stage IIIB (T3, N3, M0) non-small cell lung cancer, squamous cell carcinoma presented with large right middle lobe lung mass in addition to right hilar, subcarinal and right supraclavicular lymphadenopathy diagnosed in August 2019.The patient had evidence for disease progression in December 2020.  PRIOR THERAPY: 1) Concurrent chemoradiation with weekly carboplatin for AUC of 2 and paclitaxel 45 NG/M2. Status post 6 cycles with partial response. 2) Consolidation immunotherapy with Imfinzi 10 mg/KG every 2 weeks started February 28, 2018. Status post 26 cycles. Last cycle was given on February 21, 2019  CURRENT THERAPY: Systemic chemotherapy with carboplatin for an AUC of 5, paclitaxel 175 mg/m and Keytruda 200 mg IV every 3 weeks with Neulasta support. First dose on April 05, 2018.Status post14cycles.Starting from cycle #5 she will be on single agent maintenance Keytruda  INTERVAL HISTORY: Lisa [name] 86 y.o. female returns to the clinic today forafollow-up visit. The patient is feeling welltoday without any concerning complaints.The patient continues to tolerate single agent Keytruda well without any concerning adverse side effects. She denies any recent fever, chills, or night sweats. Her weight is stable. She denies any chest pain, hemoptysis,shortness of breath, or cough. She denies any nausea, vomiting, diarrhea, or constipation. She denies any headache or visual changes. She denies rashes or skin changes. The patient is here today for evaluation before starting cycle #15.   MEDICAL HISTORY: Past Medical History:  Diagnosis Date  . Diverticulosis   . Headache   . Hemorrhoids    internal and external  . Hyperlipidemia 01/29/1991  . Hypertension 10/29/1995  . Hypothyroidism 10/29/1995   . Lung mass    right middle lobe  . Melanoma ([place])    h/o, local excision. no chemo or rady.   . Skin cancer (melanoma) ([place])   . Tubular adenoma of colon   . Wears dentures     ALLERGIES:  is allergic to atorvastatin, celebrex [celecoxib], cholecalciferol, simvastatin, and tape.  MEDICATIONS:  Current Outpatient Medications  Medication Sig Dispense Refill  . albuterol (VENTOLIN HFA) 108 (90 Base) MCG/ACT inhaler Inhale 2 puffs into the lungs every 6 (six) hours as needed for wheezing or shortness of breath. 6.7 g 0  . amLODipine (NORVASC) 10 MG tablet Take 1 tablet by mouth once daily 90 tablet 0  . Cholecalciferol (VITAMIN D3) 50 MCG (2000 UT) TABS Take 2,000 Units by mouth 3 (three) times a week.    . Coenzyme Q10 100 MG capsule Take 100 mg by mouth daily.     Marland Kitchen docusate sodium (COLACE) 100 MG capsule Take 100 mg by mouth daily as needed for mild constipation.    . fluticasone (FLONASE) 50 MCG/ACT nasal spray USE 2 SPRAYS IN EACH NOSTRIL DAILY AS NEEDED FOR ALLERGIES 48 g 3  . levothyroxine (SYNTHROID) 75 MCG tablet Take 1 tablet by mouth once daily 90 tablet 0  . loratadine (CLARITIN) 10 MG tablet Take 1 tablet (10 mg total) by mouth daily as needed for allergies or rhinitis.    . mirtazapine (REMERON) 7.5 MG tablet TAKE 1 TABLET BY MOUTH ONCE DAILY AT BEDTIME 30 tablet 2  . Multiple Vitamin (MULTIVITAMIN WITH MINERALS) TABS tablet Take 1 tablet by mouth daily.    . sennosides-docusate sodium (SENOKOT-S) 8.6-50 MG tablet Take 1 tablet by mouth daily as needed for constipation.     No current  facility-administered medications for this visit.   Facility-Administered Medications Ordered in Other Visits  Medication Dose Route Frequency Provider Last Rate Last Admin  . acetaminophen (TYLENOL) tablet 650 mg  650 mg Oral Once Nicholas Lose, MD        SURGICAL HISTORY:  Past Surgical History:  Procedure Laterality Date  . BRONCHIAL BIOPSY  11/23/2017   Procedure: BRONCHIAL BIOPSIES;   Surgeon: Grace Isaac, MD;  Location: [facility] OR;  Service: Thoracic;;  . cataract surgery  2007, 2008   repair bilat  . DG BARIUM ENEMA ([place] [facility]) N/A 2016   [name]  . HERNIA REPAIR  04/25/2001  . HIP ARTHROPLASTY Left 05/31/2019   Procedure: LEFT HIP HEMIARTHROPLASTY;  Surgeon: Hiram Gash, MD;  Location: [facility];  Service: Orthopedics;  Laterality: Left;  . IR IMAGING GUIDED PORT INSERTION  07/05/2018  . IR US GUIDE BX ASP/DRAIN  11/17/2017  . KNEE SURGERY     meniscus tear per Dr. Ronnie Derby, R knee  . MULTIPLE TOOTH EXTRACTIONS    . SEPTOPLASTY  2006   Dr. Ernesto Rutherford  . SKIN CANCER EXCISION    . TONSILLECTOMY  1954  . VAGINAL HYSTERECTOMY    . VIDEO BRONCHOSCOPY WITH ENDOBRONCHIAL ULTRASOUND N/A 11/23/2017   Procedure: VIDEO BRONCHOSCOPY WITH ENDOBRONCHIAL ULTRASOUND;  Surgeon: Grace Isaac, MD;  Location: [facility];  Service: Thoracic;  Laterality: N/A;    REVIEW OF SYSTEMS:   Constitutional: Positive for fatigue. Negative for appetite change, chills, fever and unexpected weight change.  HENT: Negative for mouth sores, nosebleeds, sore throat and trouble swallowing.  Eyes: Negative for eye problems and icterus.  Respiratory: Negative for cough, shortness of breath, hemoptysis, and wheezing.  Cardiovascular:Positive for bilateral baseline lower extremity swelling.Negative for chest pain. Gastrointestinal: Negative for abdominal pain, constipation, diarrhea, nausea and vomiting.  Genitourinary: Negative for bladder incontinence, difficulty urinating, dysuria, frequency and hematuria.  Musculoskeletal: Negative for back pain, gait problem, neck pain and neck stiffness.  Skin: Negative for itching and rash.  Neurological: Negative for dizziness, extremity weakness, gait problem, headaches, light-headedness and seizures.  Hematological: Negative for adenopathy. Does not bruise/bleed easily.  Psychiatric/Behavioral:Positive for some dementia.Negative for confusion, depression  and sleep disturbance. The patient is not nervous/anxious.    PHYSICAL EXAMINATION:  There were no vitals taken for this visit.  ECOG PERFORMANCE STATUS: 1 - Symptomatic but completely ambulatory  Physical Exam  Constitutional: Oriented to person, place, and time andelderly appearing femaleand in no distress.  HENT:  Head: Normocephalic and atraumatic.  Mouth/Throat: Oropharynx is clear and moist. No oropharyngeal exudate.  Eyes: Conjunctivae are normal. Right eye exhibits no discharge. Left eye exhibits no discharge. No scleral icterus.  Neck: Normal range of motion. Neck supple.  Cardiovascular: Normal rate, regular rhythm, normal heart sounds and intact distal pulses.  Pulmonary/Chest: Effort normal. Wheezing in lung bilaterally. No respiratory distress. No rales.  Abdominal: Soft. Bowel sounds are normal. Exhibits no distension and no mass. There is no tenderness.  Musculoskeletal: Bilateral lower extremity swelling.Normal range of motion.  Lymphadenopathy:  No cervical adenopathy.  Neurological: Alert and oriented to person, place, and time. Exhibits normal muscle tone. Ambulates with a cane.  Skin: Skin is warm and dry. No rash noted. Not diaphoretic. No erythema. No pallor.  Psychiatric: Mood and judgment normal.Has some memory deficits. Vitals reviewed.  LABORATORY DATA: Lab Results  Component Value Date   WBC 4.6 02/15/2020   HGB 10.0 (L) 02/15/2020   HCT 33.1 (L) 02/15/2020   MCV 89.2  02/15/2020   PLT 120 (L) 02/15/2020      Chemistry      Component Value Date/Time   NA 143 02/15/2020 1126   K 3.7 02/15/2020 1126   CL 108 02/15/2020 1126   CO2 29 02/15/2020 1126   BUN 25 (H) 02/15/2020 1126   CREATININE 1.44 (H) 02/15/2020 1126      Component Value Date/Time   CALCIUM 9.4 02/15/2020 1126   ALKPHOS 67 02/15/2020 1126   AST 16 02/15/2020 1126   ALT 8 02/15/2020 1126   BILITOT 0.4 02/15/2020 1126       RADIOGRAPHIC STUDIES:  CT Abdomen Pelvis  Wo Contrast  Result Date: 01/23/2020 CLINICAL DATA:  Restaging right sided non-small cell lung cancer. EXAM: CT CHEST, ABDOMEN AND PELVIS WITHOUT CONTRAST TECHNIQUE: Multidetector CT imaging of the chest, abdomen and pelvis was performed following the standard protocol without IV contrast. COMPARISON:  11/07/2019 FINDINGS: CT CHEST FINDINGS Cardiovascular: 4.3 cm ascending thoracic aortic aneurysm is unchanged, image 33/2. Mild cardiac enlargement. Small pericardial effusion, similar to previous exam. Aortic atherosclerosis. Mild coronary artery atherosclerotic calcifications. Mediastinum/Nodes: No supraclavicular or axillary adenopathy. No mediastinal adenopathy. The hilar lymph nodes are suboptimally evaluated due to lack of IV contrast material. Normal appearance of the thyroid gland. The trachea appears patent and is midline. Normal appearance of the esophagus. Lungs/Pleura: Similar appearance of moderate volume, partially loculated right pleural effusion. Within the limitations of unenhanced technique there has been no significant interval change in the appearance of the right hilar lung mass which measures 6.6 by 5.1 cm, image 32/2. On the coronal images this measures approximately 6.2 cm cranial caudal, image 58/5, also similar to previous exam. Masslike architectural distortion and fibrosis within the paramediastinal right lung is similar to previous exam, which likely reflect changes due to external beam radiation. Scattered tiny less than 5 mm subpleural lung nodules are again noted predominantly in the right upper lobe and appear unchanged. Similar appearance of interstitial reticulation and scarring within the left lung base. Musculoskeletal: Multi level spondylosis identified within the thoracic spine. Large Schmorl's node deformity involving the inferior endplate of I43 is unchanged. No acute or suspicious osseous findings. CT ABDOMEN PELVIS FINDINGS Hepatobiliary: No focal liver abnormality is  seen. No gallstones, gallbladder wall thickening, or biliary dilatation. Pancreas: Unremarkable. No pancreatic ductal dilatation or surrounding inflammatory changes. Spleen: Normal in size without focal abnormality. Adrenals/Urinary Tract: Normal appearance of the adrenal glands. Left pelvic kidney with large cyst appears similar to previous exam. Additional, bilateral kidney cysts are noted, incompletely characterized without IV contrast. No hydronephrosis identified at this time. Stomach/Bowel: Stomach is within normal limits. Appendix appears normal. No evidence of bowel wall thickening, distention, or inflammatory changes. Numerous sigmoid diverticula noted without acute inflammation. Vascular/Lymphatic: Aortic atherosclerosis. No aneurysm. No abdominopelvic adenopathy. Reproductive: Status post hysterectomy. No adnexal masses. Other: No abdominal wall hernia or abnormality. No abdominopelvic ascites. Musculoskeletal: Previous left hip arthroplasty. Extensive lumbar degenerative disc disease. Bilateral lumbar facet arthropathy is also noted. Curvature of the lumbar spine appears convex towards the left. IMPRESSION: 1. No significant interval change in size of right hilar lung mass with surrounding changes of external beam radiation. 2. Unchanged moderate volume, partially loculated right pleural effusion. 3. Unchanged 4.3 cm ascending thoracic aortic aneurysm. Recommend annual imaging followup by CTA or MRA. This recommendation follows 2010 ACCF/AHA/AATS/ACR/ASA/SCA/SCAI/SIR/STS/SVM Guidelines for the Diagnosis and Management of Patients with Thoracic Aortic Disease. Circulation. 2010; 121: P295-J884. Aortic aneurysm NOS (ICD10-I71.9) 4. Aortic atherosclerosis and coronary artery atherosclerotic  calcifications. Aortic Atherosclerosis (ICD10-I70.0). Electronically Signed   By: Kerby Moors M.D.   On: 01/23/2020 16:49   CT Chest Wo Contrast  Result Date: 01/23/2020 CLINICAL DATA:  Restaging right sided  non-small cell lung cancer. EXAM: CT CHEST, ABDOMEN AND PELVIS WITHOUT CONTRAST TECHNIQUE: Multidetector CT imaging of the chest, abdomen and pelvis was performed following the standard protocol without IV contrast. COMPARISON:  11/07/2019 FINDINGS: CT CHEST FINDINGS Cardiovascular: 4.3 cm ascending thoracic aortic aneurysm is unchanged, image 33/2. Mild cardiac enlargement. Small pericardial effusion, similar to previous exam. Aortic atherosclerosis. Mild coronary artery atherosclerotic calcifications. Mediastinum/Nodes: No supraclavicular or axillary adenopathy. No mediastinal adenopathy. The hilar lymph nodes are suboptimally evaluated due to lack of IV contrast material. Normal appearance of the thyroid gland. The trachea appears patent and is midline. Normal appearance of the esophagus. Lungs/Pleura: Similar appearance of moderate volume, partially loculated right pleural effusion. Within the limitations of unenhanced technique there has been no significant interval change in the appearance of the right hilar lung mass which measures 6.6 by 5.1 cm, image 32/2. On the coronal images this measures approximately 6.2 cm cranial caudal, image 58/5, also similar to previous exam. Masslike architectural distortion and fibrosis within the paramediastinal right lung is similar to previous exam, which likely reflect changes due to external beam radiation. Scattered tiny less than 5 mm subpleural lung nodules are again noted predominantly in the right upper lobe and appear unchanged. Similar appearance of interstitial reticulation and scarring within the left lung base. Musculoskeletal: Multi level spondylosis identified within the thoracic spine. Large Schmorl's node deformity involving the inferior endplate of K44 is unchanged. No acute or suspicious osseous findings. CT ABDOMEN PELVIS FINDINGS Hepatobiliary: No focal liver abnormality is seen. No gallstones, gallbladder wall thickening, or biliary dilatation. Pancreas:  Unremarkable. No pancreatic ductal dilatation or surrounding inflammatory changes. Spleen: Normal in size without focal abnormality. Adrenals/Urinary Tract: Normal appearance of the adrenal glands. Left pelvic kidney with large cyst appears similar to previous exam. Additional, bilateral kidney cysts are noted, incompletely characterized without IV contrast. No hydronephrosis identified at this time. Stomach/Bowel: Stomach is within normal limits. Appendix appears normal. No evidence of bowel wall thickening, distention, or inflammatory changes. Numerous sigmoid diverticula noted without acute inflammation. Vascular/Lymphatic: Aortic atherosclerosis. No aneurysm. No abdominopelvic adenopathy. Reproductive: Status post hysterectomy. No adnexal masses. Other: No abdominal wall hernia or abnormality. No abdominopelvic ascites. Musculoskeletal: Previous left hip arthroplasty. Extensive lumbar degenerative disc disease. Bilateral lumbar facet arthropathy is also noted. Curvature of the lumbar spine appears convex towards the left. IMPRESSION: 1. No significant interval change in size of right hilar lung mass with surrounding changes of external beam radiation. 2. Unchanged moderate volume, partially loculated right pleural effusion. 3. Unchanged 4.3 cm ascending thoracic aortic aneurysm. Recommend annual imaging followup by CTA or MRA. This recommendation follows 2010 ACCF/AHA/AATS/ACR/ASA/SCA/SCAI/SIR/STS/SVM Guidelines for the Diagnosis and Management of Patients with Thoracic Aortic Disease. Circulation. 2010; 121: W102-V253. Aortic aneurysm NOS (ICD10-I71.9) 4. Aortic atherosclerosis and coronary artery atherosclerotic calcifications. Aortic Atherosclerosis (ICD10-I70.0). Electronically Signed   By: Kerby Moors M.D.   On: 01/23/2020 16:49     ASSESSMENT/PLAN:  This is a very pleasant 86 year old Caucasian female withrecurrent non-small cell lung cancer initially diagnosed stage IIIB (T3, N3, M0) non-small  cell lung cancer, squamous cell carcinoma presented with large right middle lobe lung mass in addition to right hilar, subcarinal and right supraclavicular lymphadenopathy diagnosed in August 2019.The patient had evidence for disease progression in December 2020.  She completed a  course of concurrent chemoradiation with carboplatin for an AUC of 2 and paclitaxel 5 mg/m. She is status post 6 cycles with a partial response.  She then completed consolidation immunotherapy with Imfinzi 10 mg/kg IV every 2 weeks. She is status post 26 cycles. She showed evidence of disease progression on her restaging CT scan.  The patient is currently undergoing treatment with carboplatin for an AUC of 5, paclitaxel 175 mg/m, Keytruda 200 mg IV every 3 weeks with Neulasta support.She is status post14cycles. She was hospitalized for a left hip fracturein March 2021. Her treatment was on hold while she was recovering.Startingfrom cycle #5, the patientstartedmaintenance treatment with single agent immunotherapy withKeytruda 200 mg IV every 3 weeks.  Labs were reviewed. Recommend that she proceed with cycle #15 today as scheduled.   We will see her back for a follow up visit in 3 weeks for evaluation before starting cycle #16.   The patient was advised to call immediately if she has any concerning symptoms in the interval. The patient voices understanding of current disease status and treatment options and is in agreement with the current care plan. All questions were answered. The patient knows to call the clinic with any problems, questions or concerns. We can certainly see the patient much sooner if necessary      No orders of the defined types were placed in this encounter.    Annalyce Lanpher L Heyli Min, PA-C 02/15/20

## 2020-02-15 ENCOUNTER — Inpatient Hospital Stay: Payer: Medicare Other

## 2020-02-15 ENCOUNTER — Other Ambulatory Visit: Payer: Self-pay

## 2020-02-15 ENCOUNTER — Inpatient Hospital Stay: Payer: Medicare Other | Attending: Internal Medicine | Admitting: Physician Assistant

## 2020-02-15 VITALS — BP 140/83 | HR 80 | Temp 97.9°F | Resp 18

## 2020-02-15 DIAGNOSIS — E785 Hyperlipidemia, unspecified: Secondary | ICD-10-CM | POA: Insufficient documentation

## 2020-02-15 DIAGNOSIS — C3491 Malignant neoplasm of unspecified part of right bronchus or lung: Secondary | ICD-10-CM

## 2020-02-15 DIAGNOSIS — Z85820 Personal history of malignant melanoma of skin: Secondary | ICD-10-CM | POA: Insufficient documentation

## 2020-02-15 DIAGNOSIS — E039 Hypothyroidism, unspecified: Secondary | ICD-10-CM | POA: Insufficient documentation

## 2020-02-15 DIAGNOSIS — I10 Essential (primary) hypertension: Secondary | ICD-10-CM | POA: Diagnosis not present

## 2020-02-15 DIAGNOSIS — Z5112 Encounter for antineoplastic immunotherapy: Secondary | ICD-10-CM | POA: Diagnosis not present

## 2020-02-15 DIAGNOSIS — C342 Malignant neoplasm of middle lobe, bronchus or lung: Secondary | ICD-10-CM | POA: Insufficient documentation

## 2020-02-15 DIAGNOSIS — Z95828 Presence of other vascular implants and grafts: Secondary | ICD-10-CM

## 2020-02-15 DIAGNOSIS — Z79899 Other long term (current) drug therapy: Secondary | ICD-10-CM | POA: Insufficient documentation

## 2020-02-15 LAB — CBC WITH DIFFERENTIAL (CANCER CENTER ONLY)
Abs Immature Granulocytes: 0.01 10*3/uL (ref 0.00–0.07)
Basophils Absolute: 0 10*3/uL (ref 0.0–0.1)
Basophils Relative: 1 %
Eosinophils Absolute: 0.1 10*3/uL (ref 0.0–0.5)
Eosinophils Relative: 2 %
HCT: 33.1 % — ABNORMAL LOW (ref 36.0–46.0)
Hemoglobin: 10 g/dL — ABNORMAL LOW (ref 12.0–15.0)
Immature Granulocytes: 0 %
Lymphocytes Relative: 15 %
Lymphs Abs: 0.7 10*3/uL (ref 0.7–4.0)
MCH: 27 pg (ref 26.0–34.0)
MCHC: 30.2 g/dL (ref 30.0–36.0)
MCV: 89.2 fL (ref 80.0–100.0)
Monocytes Absolute: 0.3 10*3/uL (ref 0.1–1.0)
Monocytes Relative: 6 %
Neutro Abs: 3.5 10*3/uL (ref 1.7–7.7)
Neutrophils Relative %: 76 %
Platelet Count: 120 10*3/uL — ABNORMAL LOW (ref 150–400)
RBC: 3.71 MIL/uL — ABNORMAL LOW (ref 3.87–5.11)
RDW: 15.3 % (ref 11.5–15.5)
WBC Count: 4.6 10*3/uL (ref 4.0–10.5)
nRBC: 0 % (ref 0.0–0.2)

## 2020-02-15 LAB — CMP (CANCER CENTER ONLY)
ALT: 8 U/L (ref 0–44)
AST: 16 U/L (ref 15–41)
Albumin: 3 g/dL — ABNORMAL LOW (ref 3.5–5.0)
Alkaline Phosphatase: 67 U/L (ref 38–126)
Anion gap: 6 (ref 5–15)
BUN: 25 mg/dL — ABNORMAL HIGH (ref 8–23)
CO2: 29 mmol/L (ref 22–32)
Calcium: 9.4 mg/dL (ref 8.9–10.3)
Chloride: 108 mmol/L (ref 98–111)
Creatinine: 1.44 mg/dL — ABNORMAL HIGH (ref 0.44–1.00)
GFR, Estimated: 36 mL/min — ABNORMAL LOW (ref >60)
Glucose, Bld: 104 mg/dL — ABNORMAL HIGH (ref 70–99)
Potassium: 3.7 mmol/L (ref 3.5–5.1)
Sodium: 143 mmol/L (ref 135–145)
Total Bilirubin: 0.4 mg/dL (ref 0.3–1.2)
Total Protein: 6.9 g/dL (ref 6.5–8.1)

## 2020-02-15 MED ORDER — SODIUM CHLORIDE 0.9% FLUSH
10.0000 mL | Freq: Once | INTRAVENOUS | Status: AC
  Administered 2020-02-15: 10 mL
  Filled 2020-02-15: qty 10

## 2020-02-15 MED ORDER — SODIUM CHLORIDE 0.9 % IV SOLN
Freq: Once | INTRAVENOUS | Status: AC
  Filled 2020-02-15: qty 250

## 2020-02-15 MED ORDER — SODIUM CHLORIDE 0.9 % IV SOLN
200.0000 mg | Freq: Once | INTRAVENOUS | Status: AC
  Administered 2020-02-15: 200 mg via INTRAVENOUS
  Filled 2020-02-15: qty 8

## 2020-02-15 MED ORDER — SODIUM CHLORIDE 0.9% FLUSH
10.0000 mL | INTRAVENOUS | Status: DC | PRN
  Administered 2020-02-15: 10 mL
  Filled 2020-02-15: qty 10

## 2020-02-15 MED ORDER — HEPARIN SOD (PORK) LOCK FLUSH 100 UNIT/ML IV SOLN
500.0000 [IU] | Freq: Once | INTRAVENOUS | Status: AC | PRN
  Administered 2020-02-15: 500 [IU]
  Filled 2020-02-15: qty 5

## 2020-02-15 NOTE — Patient Instructions (Signed)
[facility] Discharge Instructions for Patients Receiving Chemotherapy  Today you received the following chemotherapy agents:  Keytruda.  To help prevent nausea and vomiting after your treatment, we encourage you to take your nausea medication as directed.   If you develop nausea and vomiting that is not controlled by your nausea medication, call the clinic.   BELOW ARE SYMPTOMS THAT SHOULD BE REPORTED IMMEDIATELY:  *FEVER GREATER THAN 100.5 F  *CHILLS WITH OR WITHOUT FEVER  NAUSEA AND VOMITING THAT IS NOT CONTROLLED WITH YOUR NAUSEA MEDICATION  *UNUSUAL SHORTNESS OF BREATH  *UNUSUAL BRUISING OR BLEEDING  TENDERNESS IN MOUTH AND THROAT WITH OR WITHOUT PRESENCE OF ULCERS  *URINARY PROBLEMS  *BOWEL PROBLEMS  UNUSUAL RASH Items with * indicate a potential emergency and should be followed up as soon as possible.  Feel free to call the clinic should you have any questions or concerns. The clinic phone number is [phone].  Please show the CHEMO ALERT CARD at check-in to the Emergency Department and triage nurse.    

## 2020-02-23 ENCOUNTER — Telehealth: Payer: Self-pay | Admitting: Physician Assistant

## 2020-02-23 NOTE — Telephone Encounter (Signed)
Scheduled per los. Called, not able to leave msg. Mailed printout  

## 2020-03-07 ENCOUNTER — Encounter: Payer: Self-pay | Admitting: Internal Medicine

## 2020-03-07 ENCOUNTER — Inpatient Hospital Stay: Payer: Medicare Other

## 2020-03-07 ENCOUNTER — Other Ambulatory Visit: Payer: Self-pay

## 2020-03-07 ENCOUNTER — Inpatient Hospital Stay: Payer: Medicare Other | Attending: Internal Medicine | Admitting: Internal Medicine

## 2020-03-07 VITALS — BP 137/79 | HR 85 | Temp 97.6°F | Resp 19 | Ht 67.0 in | Wt 135.2 lb

## 2020-03-07 DIAGNOSIS — C3491 Malignant neoplasm of unspecified part of right bronchus or lung: Secondary | ICD-10-CM | POA: Diagnosis not present

## 2020-03-07 DIAGNOSIS — Z9221 Personal history of antineoplastic chemotherapy: Secondary | ICD-10-CM | POA: Insufficient documentation

## 2020-03-07 DIAGNOSIS — Z79899 Other long term (current) drug therapy: Secondary | ICD-10-CM | POA: Insufficient documentation

## 2020-03-07 DIAGNOSIS — Z5112 Encounter for antineoplastic immunotherapy: Secondary | ICD-10-CM | POA: Diagnosis not present

## 2020-03-07 DIAGNOSIS — E039 Hypothyroidism, unspecified: Secondary | ICD-10-CM | POA: Diagnosis not present

## 2020-03-07 DIAGNOSIS — E785 Hyperlipidemia, unspecified: Secondary | ICD-10-CM | POA: Diagnosis not present

## 2020-03-07 DIAGNOSIS — C342 Malignant neoplasm of middle lobe, bronchus or lung: Secondary | ICD-10-CM | POA: Insufficient documentation

## 2020-03-07 DIAGNOSIS — D638 Anemia in other chronic diseases classified elsewhere: Secondary | ICD-10-CM | POA: Insufficient documentation

## 2020-03-07 DIAGNOSIS — Z86010 Personal history of colonic polyps: Secondary | ICD-10-CM | POA: Diagnosis not present

## 2020-03-07 DIAGNOSIS — I10 Essential (primary) hypertension: Secondary | ICD-10-CM | POA: Insufficient documentation

## 2020-03-07 DIAGNOSIS — R5383 Other fatigue: Secondary | ICD-10-CM | POA: Insufficient documentation

## 2020-03-07 DIAGNOSIS — C3490 Malignant neoplasm of unspecified part of unspecified bronchus or lung: Secondary | ICD-10-CM

## 2020-03-07 DIAGNOSIS — Z85820 Personal history of malignant melanoma of skin: Secondary | ICD-10-CM | POA: Diagnosis not present

## 2020-03-07 DIAGNOSIS — Z923 Personal history of irradiation: Secondary | ICD-10-CM | POA: Insufficient documentation

## 2020-03-07 DIAGNOSIS — Z95828 Presence of other vascular implants and grafts: Secondary | ICD-10-CM

## 2020-03-07 LAB — CBC WITH DIFFERENTIAL (CANCER CENTER ONLY)
Abs Immature Granulocytes: 0.01 10*3/uL (ref 0.00–0.07)
Basophils Absolute: 0 10*3/uL (ref 0.0–0.1)
Basophils Relative: 0 %
Eosinophils Absolute: 0.1 10*3/uL (ref 0.0–0.5)
Eosinophils Relative: 3 %
HCT: 29.1 % — ABNORMAL LOW (ref 36.0–46.0)
Hemoglobin: 8.9 g/dL — ABNORMAL LOW (ref 12.0–15.0)
Immature Granulocytes: 0 %
Lymphocytes Relative: 13 %
Lymphs Abs: 0.7 10*3/uL (ref 0.7–4.0)
MCH: 27.1 pg (ref 26.0–34.0)
MCHC: 30.6 g/dL (ref 30.0–36.0)
MCV: 88.4 fL (ref 80.0–100.0)
Monocytes Absolute: 0.4 10*3/uL (ref 0.1–1.0)
Monocytes Relative: 8 %
Neutro Abs: 4 10*3/uL (ref 1.7–7.7)
Neutrophils Relative %: 76 %
Platelet Count: 140 10*3/uL — ABNORMAL LOW (ref 150–400)
RBC: 3.29 MIL/uL — ABNORMAL LOW (ref 3.87–5.11)
RDW: 15.8 % — ABNORMAL HIGH (ref 11.5–15.5)
WBC Count: 5.2 10*3/uL (ref 4.0–10.5)
nRBC: 0 % (ref 0.0–0.2)

## 2020-03-07 LAB — CMP (CANCER CENTER ONLY)
ALT: 11 U/L (ref 0–44)
AST: 18 U/L (ref 15–41)
Albumin: 2.8 g/dL — ABNORMAL LOW (ref 3.5–5.0)
Alkaline Phosphatase: 71 U/L (ref 38–126)
Anion gap: 9 (ref 5–15)
BUN: 22 mg/dL (ref 8–23)
CO2: 26 mmol/L (ref 22–32)
Calcium: 9.7 mg/dL (ref 8.9–10.3)
Chloride: 108 mmol/L (ref 98–111)
Creatinine: 1.49 mg/dL — ABNORMAL HIGH (ref 0.44–1.00)
GFR, Estimated: 34 mL/min — ABNORMAL LOW (ref >60)
Glucose, Bld: 94 mg/dL (ref 70–99)
Potassium: 3.5 mmol/L (ref 3.5–5.1)
Sodium: 143 mmol/L (ref 135–145)
Total Bilirubin: 0.4 mg/dL (ref 0.3–1.2)
Total Protein: 6.6 g/dL (ref 6.5–8.1)

## 2020-03-07 LAB — TSH: TSH: 3.073 u[IU]/mL (ref 0.308–3.960)

## 2020-03-07 MED ORDER — HEPARIN SOD (PORK) LOCK FLUSH 100 UNIT/ML IV SOLN
500.0000 [IU] | Freq: Once | INTRAVENOUS | Status: AC | PRN
  Administered 2020-03-07: 500 [IU]
  Filled 2020-03-07: qty 5

## 2020-03-07 MED ORDER — SODIUM CHLORIDE 0.9% FLUSH
10.0000 mL | INTRAVENOUS | Status: DC | PRN
  Administered 2020-03-07: 10 mL
  Filled 2020-03-07: qty 10

## 2020-03-07 MED ORDER — SODIUM CHLORIDE 0.9 % IV SOLN
200.0000 mg | Freq: Once | INTRAVENOUS | Status: AC
  Administered 2020-03-07: 200 mg via INTRAVENOUS
  Filled 2020-03-07: qty 8

## 2020-03-07 MED ORDER — SODIUM CHLORIDE 0.9% FLUSH
10.0000 mL | Freq: Once | INTRAVENOUS | Status: AC
  Administered 2020-03-07: 10 mL
  Filled 2020-03-07: qty 10

## 2020-03-07 MED ORDER — SODIUM CHLORIDE 0.9 % IV SOLN
Freq: Once | INTRAVENOUS | Status: AC
  Filled 2020-03-07: qty 250

## 2020-03-07 NOTE — Patient Instructions (Signed)
[facility] Discharge Instructions for Patients Receiving Chemotherapy  Today you received the following chemotherapy agents Pembrolizumab [facility]).  To help prevent nausea and vomiting after your treatment, we encourage you to take your nausea medication as prescribed.   If you develop nausea and vomiting that is not controlled by your nausea medication, call the clinic.   BELOW ARE SYMPTOMS THAT SHOULD BE REPORTED IMMEDIATELY:  *FEVER GREATER THAN 100.5 F  *CHILLS WITH OR WITHOUT FEVER  NAUSEA AND VOMITING THAT IS NOT CONTROLLED WITH YOUR NAUSEA MEDICATION  *UNUSUAL SHORTNESS OF BREATH  *UNUSUAL BRUISING OR BLEEDING  TENDERNESS IN MOUTH AND THROAT WITH OR WITHOUT PRESENCE OF ULCERS  *URINARY PROBLEMS  *BOWEL PROBLEMS  UNUSUAL RASH Items with * indicate a potential emergency and should be followed up as soon as possible.  Feel free to call the clinic should you have any questions or concerns. The clinic phone number is [phone].  Please show the [place] at check-in to the Emergency Department and triage nurse.

## 2020-03-07 NOTE — Progress Notes (Signed)
[facility] [phone]   [phone]) [phone]  OFFICE PROGRESS NOTE  [name], MD [place] [address]  DIAGNOSIS: Recurrent non-small cell lung cancer initially diagnosed stage IIIB (T3, N3, M0) non-small cell lung cancer, squamous cell carcinoma presented with large right middle lobe lung mass in addition to right hilar, subcarinal and right supraclavicular lymphadenopathy diagnosed in August 2019.  The patient had evidence for disease progression in December 2020.  PRIOR THERAPY:  1) Concurrent chemoradiation with weekly carboplatin for AUC of 2 and paclitaxel 45 NG/M2.  Status post 6 cycles with partial response. 2) Consolidation immunotherapy with Imfinzi 10 mg/KG every 2 weeks started February 28, 2018.  Status post 26 cycles.  Last cycle was given on February 21, 2019  CURRENT THERAPY: Systemic chemotherapy with carboplatin for AUC of 5, paclitaxel 175 mg/M2 and Keytruda 200 mg IV every 3 weeks.  First dose April 03, 2018.  Starting from cycle #5 the patient is on maintenance treatment with single agent Keytruda.  Status post 15 cycles.  INTERVAL HISTORY: Lisa [name] 86 y.o. female returns to the clinic today for follow-up visit.  The patient is feeling fine today with no concerning complaints except for fatigue.  She denied having any chest pain, shortness of breath, cough or hemoptysis.  She denied having any nausea, vomiting, diarrhea or constipation.  She has no headache or visual changes.  She has been tolerating her maintenance treatment with Keytruda fairly well.  She is here today for evaluation before starting cycle #16.  MEDICAL HISTORY: Past Medical History:  Diagnosis Date  . Diverticulosis   . Headache   . Hemorrhoids    internal and external  . Hyperlipidemia 01/29/1991  . Hypertension 10/29/1995  . Hypothyroidism 10/29/1995  . Lung mass    right middle lobe  . Melanoma ([place])    h/o, local excision.  no chemo or rady.   . Skin cancer (melanoma) ([place])   . Tubular adenoma of colon   . Wears dentures     ALLERGIES:  is allergic to atorvastatin, celebrex [celecoxib], cholecalciferol, simvastatin, and tape.  MEDICATIONS:  Current Outpatient Medications  Medication Sig Dispense Refill  . albuterol (VENTOLIN HFA) 108 (90 Base) MCG/ACT inhaler Inhale 2 puffs into the lungs every 6 (six) hours as needed for wheezing or shortness of breath. 6.7 g 0  . amLODipine (NORVASC) 10 MG tablet Take 1 tablet by mouth once daily 90 tablet 0  . Cholecalciferol (VITAMIN D3) 50 MCG (2000 UT) TABS Take 2,000 Units by mouth 3 (three) times a week.    . Coenzyme Q10 100 MG capsule Take 100 mg by mouth daily.     Marland Kitchen docusate sodium (COLACE) 100 MG capsule Take 100 mg by mouth daily as needed for mild constipation.    . fluticasone (FLONASE) 50 MCG/ACT nasal spray USE 2 SPRAYS IN EACH NOSTRIL DAILY AS NEEDED FOR ALLERGIES 48 g 3  . levothyroxine (SYNTHROID) 75 MCG tablet Take 1 tablet by mouth once daily 90 tablet 0  . loratadine (CLARITIN) 10 MG tablet Take 1 tablet (10 mg total) by mouth daily as needed for allergies or rhinitis.    . mirtazapine (REMERON) 7.5 MG tablet TAKE 1 TABLET BY MOUTH ONCE DAILY AT BEDTIME 30 tablet 2  . Multiple Vitamin (MULTIVITAMIN WITH MINERALS) TABS tablet Take 1 tablet by mouth daily.    . sennosides-docusate sodium (SENOKOT-S) 8.6-50 MG tablet Take 1 tablet by mouth  daily as needed for constipation.     No current facility-administered medications for this visit.   Facility-Administered Medications Ordered in Other Visits  Medication Dose Route Frequency Provider Last Rate Last Admin  . acetaminophen (TYLENOL) tablet 650 mg  650 mg Oral Once Nicholas Lose, MD        SURGICAL HISTORY:  Past Surgical History:  Procedure Laterality Date  . BRONCHIAL BIOPSY  11/23/2017   Procedure: BRONCHIAL BIOPSIES;  Surgeon: Grace Isaac, MD;  Location: [facility] OR;  Service: Thoracic;;  .  cataract surgery  2007, 2008   repair bilat  . DG BARIUM ENEMA ([place] HX) N/A 2016   [name]  . HERNIA REPAIR  04/25/2001  . HIP ARTHROPLASTY Left 05/31/2019   Procedure: LEFT HIP HEMIARTHROPLASTY;  Surgeon: Hiram Gash, MD;  Location: [facility];  Service: Orthopedics;  Laterality: Left;  . IR IMAGING GUIDED PORT INSERTION  07/05/2018  . IR US GUIDE BX ASP/DRAIN  11/17/2017  . KNEE SURGERY     meniscus tear per Dr. Ronnie Derby, R knee  . MULTIPLE TOOTH EXTRACTIONS    . SEPTOPLASTY  2006   Dr. Ernesto Rutherford  . SKIN CANCER EXCISION    . TONSILLECTOMY  1954  . VAGINAL HYSTERECTOMY    . VIDEO BRONCHOSCOPY WITH ENDOBRONCHIAL ULTRASOUND N/A 11/23/2017   Procedure: VIDEO BRONCHOSCOPY WITH ENDOBRONCHIAL ULTRASOUND;  Surgeon: Grace Isaac, MD;  Location: [facility];  Service: Thoracic;  Laterality: N/A;    REVIEW OF SYSTEMS:  A comprehensive review of systems was negative except for: Constitutional: positive for fatigue   PHYSICAL EXAMINATION: General appearance: alert, cooperative, fatigued and no distress Head: Normocephalic, without obvious abnormality, atraumatic Neck: no adenopathy, no JVD, supple, symmetrical, trachea midline and thyroid not enlarged, symmetric, no tenderness/mass/nodules Lymph nodes: Cervical, supraclavicular, and axillary nodes normal. Resp: clear to auscultation bilaterally Back: symmetric, no curvature. ROM normal. No CVA tenderness. Cardio: regular rate and rhythm, S1, S2 normal, no murmur, click, rub or gallop GI: soft, non-tender; bowel sounds normal; no masses,  no organomegaly Extremities: extremities normal, atraumatic, no cyanosis or edema  ECOG PERFORMANCE STATUS: 1 - Symptomatic but completely ambulatory  Blood pressure 137/79, pulse 85, temperature 97.6 F (36.4 C), temperature source Tympanic, resp. rate 19, height 5\' 7"  (1.702 m), weight 135 lb 3.2 oz (61.3 kg), SpO2 97 %.  LABORATORY DATA: Lab Results  Component Value Date   WBC 5.2 03/07/2020   HGB 8.9 (L)  03/07/2020   HCT 29.1 (L) 03/07/2020   MCV 88.4 03/07/2020   PLT 140 (L) 03/07/2020      Chemistry      Component Value Date/Time   NA 143 02/15/2020 1126   K 3.7 02/15/2020 1126   CL 108 02/15/2020 1126   CO2 29 02/15/2020 1126   BUN 25 (H) 02/15/2020 1126   CREATININE 1.44 (H) 02/15/2020 1126      Component Value Date/Time   CALCIUM 9.4 02/15/2020 1126   ALKPHOS 67 02/15/2020 1126   AST 16 02/15/2020 1126   ALT 8 02/15/2020 1126   BILITOT 0.4 02/15/2020 1126       RADIOGRAPHIC STUDIES: No results found.  ASSESSMENT AND PLAN: This is a very pleasant 86 years old white female with stage IIIb non-small cell lung cancer, squamous cell carcinoma.  She is currently undergoing a course of concurrent chemoradiation with weekly carboplatin and paclitaxel status post 6 cycles.  She tolerated this treatment well with partial response. The patient also completed the course of consolidation treatment  with immunotherapy with Imfinzi status post 26 cycles.  She tolerated her previous treatment well. Her recent CT scan of the chest showed progressive consolidation in the right lung suspicious for disease recurrence but also worsening post radiation effect could not be excluded.  A PET scan showed rounded hypermetabolic soft tissue mass inferior to the right hilum consistent with lung cancer recurrence.  There was also hypermetabolic precarinal lymph node concerning for nodal metastasis but no evidence for metastatic disease in the abdomen or pelvis.  The patient also had groundglass density in the right upper lobe, right middle lobe as well as left lower lobe suspicious for inflammatory process and less likely malignancy. The patient started treatment with systemic chemotherapy with carboplatin for AUC of 5, paclitaxel 175 mg/M2 and Keytruda 200 mg IV every 3 weeks with Neulasta support status post 15 cycles.  Starting from cycle #5 the patient is currently on maintenance treatment with single  agent Keytruda every 3 weeks.   The patient has been tolerating her treatment with Keytruda fairly well. I recommended for her to proceed with cycle #16 today as planned. I will see her back for follow-up visit in 3 weeks for evaluation with repeat CT scan of the chest, abdomen pelvis for restaging of her disease. For the anemia of chronic disease, we will continue to monitor her hemoglobin hematocrit closely and consider the patient for PRBCs transfusion if her hemoglobin is less than 8.0 g/dL. She was advised to call immediately if she has any concerning symptoms in the interval. The patient voices understanding of current disease status and treatment options and is in agreement with the current care plan. All questions were answered. The patient knows to call the clinic with any problems, questions or concerns. We can certainly see the patient much sooner if necessary.  Disclaimer: This note was dictated with voice recognition software. Similar sounding words can inadvertently be transcribed and may not be corrected upon review.

## 2020-03-07 NOTE — Patient Instructions (Signed)

## 2020-03-13 ENCOUNTER — Encounter: Payer: Self-pay | Admitting: Internal Medicine

## 2020-03-18 ENCOUNTER — Other Ambulatory Visit: Payer: Self-pay | Admitting: Family Medicine

## 2020-03-19 NOTE — Telephone Encounter (Signed)
Refill request Levothyroxine Last office visit 06/22/19 Last refill 11/06/19 #90 See lab reports thyroid being checked every 3 weeks, please confirm correct dose

## 2020-03-20 NOTE — Telephone Encounter (Signed)
Most recent TSH is normal.  Would continue as is.  Prescription sent.  Thanks.

## 2020-03-26 ENCOUNTER — Emergency Department (HOSPITAL_COMMUNITY)
Admission: EM | Admit: 2020-03-26 | Discharge: 2020-03-26 | Discharge status: Absent without leave | Payer: Medicare Other

## 2020-03-26 ENCOUNTER — Encounter (HOSPITAL_COMMUNITY): Payer: Self-pay

## 2020-03-26 ENCOUNTER — Ambulatory Visit (HOSPITAL_COMMUNITY)
Admission: RE | Admit: 2020-03-26 | Discharge: 2020-03-26 | Disposition: A | Discharge status: Home / Self Care | Payer: Medicare Other | Source: Ambulatory Visit | Attending: Internal Medicine | Admitting: Internal Medicine

## 2020-03-26 ENCOUNTER — Other Ambulatory Visit: Payer: Self-pay

## 2020-03-26 DIAGNOSIS — C3490 Malignant neoplasm of unspecified part of unspecified bronchus or lung: Secondary | ICD-10-CM

## 2020-03-28 ENCOUNTER — Inpatient Hospital Stay: Payer: Medicare Other

## 2020-03-28 ENCOUNTER — Other Ambulatory Visit: Payer: Self-pay

## 2020-03-28 ENCOUNTER — Inpatient Hospital Stay: Payer: Medicare Other | Admitting: Internal Medicine

## 2020-03-28 ENCOUNTER — Encounter: Payer: Self-pay | Admitting: Internal Medicine

## 2020-03-28 ENCOUNTER — Telehealth: Payer: Self-pay | Admitting: Internal Medicine

## 2020-03-28 VITALS — BP 121/77 | HR 87 | Temp 97.0°F | Resp 20 | Ht 67.0 in | Wt 143.5 lb

## 2020-03-28 DIAGNOSIS — Z95828 Presence of other vascular implants and grafts: Secondary | ICD-10-CM

## 2020-03-28 DIAGNOSIS — Z79899 Other long term (current) drug therapy: Secondary | ICD-10-CM | POA: Diagnosis not present

## 2020-03-28 DIAGNOSIS — Z923 Personal history of irradiation: Secondary | ICD-10-CM | POA: Diagnosis not present

## 2020-03-28 DIAGNOSIS — C3491 Malignant neoplasm of unspecified part of right bronchus or lung: Secondary | ICD-10-CM

## 2020-03-28 DIAGNOSIS — Z85820 Personal history of malignant melanoma of skin: Secondary | ICD-10-CM | POA: Diagnosis not present

## 2020-03-28 DIAGNOSIS — Z5112 Encounter for antineoplastic immunotherapy: Secondary | ICD-10-CM

## 2020-03-28 DIAGNOSIS — E039 Hypothyroidism, unspecified: Secondary | ICD-10-CM | POA: Diagnosis not present

## 2020-03-28 DIAGNOSIS — R5383 Other fatigue: Secondary | ICD-10-CM | POA: Diagnosis not present

## 2020-03-28 DIAGNOSIS — Z9221 Personal history of antineoplastic chemotherapy: Secondary | ICD-10-CM | POA: Diagnosis not present

## 2020-03-28 DIAGNOSIS — I10 Essential (primary) hypertension: Secondary | ICD-10-CM | POA: Diagnosis not present

## 2020-03-28 DIAGNOSIS — E785 Hyperlipidemia, unspecified: Secondary | ICD-10-CM | POA: Diagnosis not present

## 2020-03-28 DIAGNOSIS — D638 Anemia in other chronic diseases classified elsewhere: Secondary | ICD-10-CM | POA: Diagnosis not present

## 2020-03-28 DIAGNOSIS — Z86010 Personal history of colonic polyps: Secondary | ICD-10-CM | POA: Diagnosis not present

## 2020-03-28 DIAGNOSIS — C342 Malignant neoplasm of middle lobe, bronchus or lung: Secondary | ICD-10-CM | POA: Diagnosis not present

## 2020-03-28 LAB — CBC WITH DIFFERENTIAL (CANCER CENTER ONLY)
Abs Immature Granulocytes: 0.02 10*3/uL (ref 0.00–0.07)
Basophils Absolute: 0 10*3/uL (ref 0.0–0.1)
Basophils Relative: 1 %
Eosinophils Absolute: 0.1 10*3/uL (ref 0.0–0.5)
Eosinophils Relative: 2 %
HCT: 28.3 % — ABNORMAL LOW (ref 36.0–46.0)
Hemoglobin: 8.4 g/dL — ABNORMAL LOW (ref 12.0–15.0)
Immature Granulocytes: 0 %
Lymphocytes Relative: 14 %
Lymphs Abs: 0.8 10*3/uL (ref 0.7–4.0)
MCH: 26.3 pg (ref 26.0–34.0)
MCHC: 29.7 g/dL — ABNORMAL LOW (ref 30.0–36.0)
MCV: 88.7 fL (ref 80.0–100.0)
Monocytes Absolute: 0.4 10*3/uL (ref 0.1–1.0)
Monocytes Relative: 8 %
Neutro Abs: 4 10*3/uL (ref 1.7–7.7)
Neutrophils Relative %: 75 %
Platelet Count: 161 10*3/uL (ref 150–400)
RBC: 3.19 MIL/uL — ABNORMAL LOW (ref 3.87–5.11)
RDW: 16.2 % — ABNORMAL HIGH (ref 11.5–15.5)
WBC Count: 5.3 10*3/uL (ref 4.0–10.5)
nRBC: 0 % (ref 0.0–0.2)

## 2020-03-28 LAB — CMP (CANCER CENTER ONLY)
ALT: 12 U/L (ref 0–44)
AST: 18 U/L (ref 15–41)
Albumin: 2.8 g/dL — ABNORMAL LOW (ref 3.5–5.0)
Alkaline Phosphatase: 64 U/L (ref 38–126)
Anion gap: 6 (ref 5–15)
BUN: 24 mg/dL — ABNORMAL HIGH (ref 8–23)
CO2: 30 mmol/L (ref 22–32)
Calcium: 9.7 mg/dL (ref 8.9–10.3)
Chloride: 107 mmol/L (ref 98–111)
Creatinine: 1.53 mg/dL — ABNORMAL HIGH (ref 0.44–1.00)
GFR, Estimated: 33 mL/min — ABNORMAL LOW (ref >60)
Glucose, Bld: 107 mg/dL — ABNORMAL HIGH (ref 70–99)
Potassium: 3.9 mmol/L (ref 3.5–5.1)
Sodium: 143 mmol/L (ref 135–145)
Total Bilirubin: 0.3 mg/dL (ref 0.3–1.2)
Total Protein: 6.7 g/dL (ref 6.5–8.1)

## 2020-03-28 LAB — TSH: TSH: 8.798 u[IU]/mL — ABNORMAL HIGH (ref 0.308–3.960)

## 2020-03-28 MED ORDER — SODIUM CHLORIDE 0.9 % IV SOLN
Freq: Once | INTRAVENOUS | Status: AC
  Filled 2020-03-28: qty 250

## 2020-03-28 MED ORDER — SODIUM CHLORIDE 0.9% FLUSH
10.0000 mL | INTRAVENOUS | Status: DC | PRN
  Administered 2020-03-28: 10 mL
  Filled 2020-03-28: qty 10

## 2020-03-28 MED ORDER — SODIUM CHLORIDE 0.9 % IV SOLN
200.0000 mg | Freq: Once | INTRAVENOUS | Status: AC
  Administered 2020-03-28: 200 mg via INTRAVENOUS
  Filled 2020-03-28: qty 8

## 2020-03-28 MED ORDER — HEPARIN SOD (PORK) LOCK FLUSH 100 UNIT/ML IV SOLN
500.0000 [IU] | Freq: Once | INTRAVENOUS | Status: AC | PRN
  Administered 2020-03-28: 500 [IU]
  Filled 2020-03-28: qty 5

## 2020-03-28 MED ORDER — SODIUM CHLORIDE 0.9% FLUSH
10.0000 mL | Freq: Once | INTRAVENOUS | Status: AC
  Administered 2020-03-28: 10 mL
  Filled 2020-03-28: qty 10

## 2020-03-28 NOTE — Patient Instructions (Signed)
[facility] Discharge Instructions for Patients Receiving Chemotherapy  Today you received the following chemotherapy agents Pembrolizumab [facility]).  To help prevent nausea and vomiting after your treatment, we encourage you to take your nausea medication as prescribed.   If you develop nausea and vomiting that is not controlled by your nausea medication, call the clinic.   BELOW ARE SYMPTOMS THAT SHOULD BE REPORTED IMMEDIATELY:  *FEVER GREATER THAN 100.5 F  *CHILLS WITH OR WITHOUT FEVER  NAUSEA AND VOMITING THAT IS NOT CONTROLLED WITH YOUR NAUSEA MEDICATION  *UNUSUAL SHORTNESS OF BREATH  *UNUSUAL BRUISING OR BLEEDING  TENDERNESS IN MOUTH AND THROAT WITH OR WITHOUT PRESENCE OF ULCERS  *URINARY PROBLEMS  *BOWEL PROBLEMS  UNUSUAL RASH Items with * indicate a potential emergency and should be followed up as soon as possible.  Feel free to call the clinic should you have any questions or concerns. The clinic phone number is [phone].  Please show the [facility] at check-in to the Emergency Department and triage nurse.

## 2020-03-28 NOTE — Progress Notes (Signed)
[place] Telephone:([phone]) [phone]   Fax:([phone]) [phone]  OFFICE PROGRESS NOTE  [name], MD [place] [address]  DIAGNOSIS: Recurrent non-small cell lung cancer initially diagnosed stage IIIB (T3, N3, M0) non-small cell lung cancer, squamous cell carcinoma presented with large right middle lobe lung mass in addition to right hilar, subcarinal and right supraclavicular lymphadenopathy diagnosed in August 2019.  The patient had evidence for disease progression in December 2020.  PRIOR THERAPY:  1) Concurrent chemoradiation with weekly carboplatin for AUC of 2 and paclitaxel 45 NG/M2.  Status post 6 cycles with partial response. 2) Consolidation immunotherapy with Imfinzi 10 mg/KG every 2 weeks started February 28, 2018.  Status post 26 cycles.  Last cycle was given on February 21, 2019  CURRENT THERAPY: Systemic chemotherapy with carboplatin for AUC of 5, paclitaxel 175 mg/M2 and Keytruda 200 mg IV every 3 weeks.  First dose April 03, 2018.  Starting from cycle #5 the patient is on maintenance treatment with single agent Keytruda.  Status post 16 cycles.  INTERVAL HISTORY: Lisa [name] 86 y.o. female returns to the clinic today for follow-up visit accompanied by her granddaughter.  The patient is feeling fine today with no concerning complaints except for the fatigue and shortness of breath at baseline increased with exertion.  She also has some memory issues recently.  She denied having any chest pain, cough or hemoptysis.  She denied having any fever or chills.  She has no nausea, vomiting, diarrhea or constipation.  She has no headache or visual changes.  She has small areas of skin rash especially on the face.  The patient continues to tolerate her treatment with immunotherapy fairly well.  She had repeat CT scan of the chest, abdomen pelvis performed recently and she is here for evaluation and discussion of her scan results.  MEDICAL  HISTORY: Past Medical History:  Diagnosis Date  . Diverticulosis   . Headache   . Hemorrhoids    internal and external  . Hyperlipidemia 01/29/1991  . Hypertension 10/29/1995  . Hypothyroidism 10/29/1995  . Lung mass    right middle lobe  . Melanoma ([place])    h/o, local excision. no chemo or rady.   . Skin cancer (melanoma) ([place])   . Tubular adenoma of colon   . Wears dentures     ALLERGIES:  is allergic to atorvastatin, celebrex [celecoxib], cholecalciferol, simvastatin, and tape.  MEDICATIONS:  Current Outpatient Medications  Medication Sig Dispense Refill  . albuterol (VENTOLIN HFA) 108 (90 Base) MCG/ACT inhaler Inhale 2 puffs into the lungs every 6 (six) hours as needed for wheezing or shortness of breath. 6.7 g 0  . amLODipine (NORVASC) 10 MG tablet Take 1 tablet by mouth once daily 90 tablet 0  . Cholecalciferol (VITAMIN D3) 50 MCG (2000 UT) TABS Take 2,000 Units by mouth 3 (three) times a week.    . Coenzyme Q10 100 MG capsule Take 100 mg by mouth daily.     Marland Kitchen docusate sodium (COLACE) 100 MG capsule Take 100 mg by mouth daily as needed for mild constipation.    . fluticasone (FLONASE) 50 MCG/ACT nasal spray USE 2 SPRAYS IN EACH NOSTRIL DAILY AS NEEDED FOR ALLERGIES 48 g 3  . levothyroxine (SYNTHROID) 75 MCG tablet Take 1 tablet by mouth once daily 90 tablet 1  . loratadine (CLARITIN) 10 MG tablet Take 1 tablet (10 mg total) by mouth daily as needed for allergies or  rhinitis.    . mirtazapine (REMERON) 7.5 MG tablet TAKE 1 TABLET BY MOUTH ONCE DAILY AT BEDTIME 30 tablet 2  . Multiple Vitamin (MULTIVITAMIN WITH MINERALS) TABS tablet Take 1 tablet by mouth daily.    . sennosides-docusate sodium (SENOKOT-S) 8.6-50 MG tablet Take 1 tablet by mouth daily as needed for constipation.     No current facility-administered medications for this visit.   Facility-Administered Medications Ordered in Other Visits  Medication Dose Route Frequency Provider Last Rate Last Admin  .  acetaminophen (TYLENOL) tablet 650 mg  650 mg Oral Once Nicholas Lose, MD        SURGICAL HISTORY:  Past Surgical History:  Procedure Laterality Date  . BRONCHIAL BIOPSY  11/23/2017   Procedure: BRONCHIAL BIOPSIES;  Surgeon: Grace Isaac, MD;  Location: [facility] OR;  Service: Thoracic;;  . cataract surgery  2007, 2008   repair bilat  . DG BARIUM ENEMA ([place] [facility]) N/A 2016   [name]  . HERNIA REPAIR  04/25/2001  . HIP ARTHROPLASTY Left 05/31/2019   Procedure: LEFT HIP HEMIARTHROPLASTY;  Surgeon: Hiram Gash, MD;  Location: [facility];  Service: Orthopedics;  Laterality: Left;  . IR IMAGING GUIDED PORT INSERTION  07/05/2018  . IR US GUIDE BX ASP/DRAIN  11/17/2017  . KNEE SURGERY     meniscus tear per Dr. Ronnie Derby, R knee  . MULTIPLE TOOTH EXTRACTIONS    . SEPTOPLASTY  2006   Dr. Ernesto Rutherford  . SKIN CANCER EXCISION    . TONSILLECTOMY  1954  . VAGINAL HYSTERECTOMY    . VIDEO BRONCHOSCOPY WITH ENDOBRONCHIAL ULTRASOUND N/A 11/23/2017   Procedure: VIDEO BRONCHOSCOPY WITH ENDOBRONCHIAL ULTRASOUND;  Surgeon: Grace Isaac, MD;  Location: [place] [facility];  Service: Thoracic;  Laterality: N/A;    REVIEW OF SYSTEMS:  Constitutional: positive for fatigue Eyes: negative Ears, nose, mouth, throat, and face: negative Respiratory: positive for dyspnea on exertion Cardiovascular: negative Gastrointestinal: negative Genitourinary:negative Integument/breast: negative Hematologic/lymphatic: negative Musculoskeletal:positive for muscle weakness Neurological: negative Behavioral/Psych: negative Endocrine: negative Allergic/Immunologic: negative   PHYSICAL EXAMINATION: General appearance: alert, cooperative, fatigued and no distress Head: Normocephalic, without obvious abnormality, atraumatic Neck: no adenopathy, no JVD, supple, symmetrical, trachea midline and thyroid not enlarged, symmetric, no tenderness/mass/nodules Lymph nodes: Cervical, supraclavicular, and axillary nodes normal. Resp: clear to  auscultation bilaterally Back: symmetric, no curvature. ROM normal. No CVA tenderness. Cardio: regular rate and rhythm, S1, S2 normal, no murmur, click, rub or gallop GI: soft, non-tender; bowel sounds normal; no masses,  no organomegaly Extremities: extremities normal, atraumatic, no cyanosis or edema Neurologic: Alert and oriented X 3, normal strength and tone. Normal symmetric reflexes. Normal coordination and gait  ECOG PERFORMANCE STATUS: 1 - Symptomatic but completely ambulatory  Blood pressure 121/77, pulse 87, temperature (!) 97 F (36.1 C), temperature source Tympanic, resp. rate 20, height 5\' 7"  (1.702 m), weight 143 lb 8 oz (65.1 kg), SpO2 96 %.  LABORATORY DATA: Lab Results  Component Value Date   WBC 5.3 03/28/2020   HGB 8.4 (L) 03/28/2020   HCT 28.3 (L) 03/28/2020   MCV 88.7 03/28/2020   PLT 161 03/28/2020      Chemistry      Component Value Date/Time   NA 143 03/28/2020 0816   K 3.9 03/28/2020 0816   CL 107 03/28/2020 0816   CO2 30 03/28/2020 0816   BUN 24 (H) 03/28/2020 0816   CREATININE 1.53 (H) 03/28/2020 0816      Component Value Date/Time   CALCIUM 9.7 03/28/2020 0816  ALKPHOS 64 03/28/2020 0816   AST 18 03/28/2020 0816   ALT 12 03/28/2020 0816   BILITOT 0.3 03/28/2020 0816       RADIOGRAPHIC STUDIES: CT Abdomen Pelvis Wo Contrast  Result Date: 03/26/2020 CLINICAL DATA:  Follow-up non-small cell lung carcinoma. Previous radiation therapy and chemotherapy. Ongoing immunotherapy. EXAM: CT CHEST, ABDOMEN AND PELVIS WITHOUT CONTRAST TECHNIQUE: Multidetector CT imaging of the chest, abdomen and pelvis was performed following the standard protocol without IV contrast. COMPARISON:  01/23/2020 FINDINGS: CT CHEST FINDINGS Cardiovascular: No acute findings. 4.9 cm ascending thoracic aortic aneurysm is stable when remeasured in same plane on today's exam. Mediastinum/Lymph Nodes: No pathologically enlarged mediastinal lymph nodes identified. Large central right  lung mass involves the right hilum. Lungs/Pleura: A large central right lung mass is again seen involving the right hilum. This measures 10.0 x 5.8 cm on image 34/2, without significant change in size compared to prior study. Post radiation changes again seen in the central right lung with mild traction bronchiectasis. New poorly defined areas of airspace disease are seen in the posterior and lateral right upper lobe, and new small ill-defined areas of airspace disease are seen in the left upper lobe and superior left lower lobe. These findings are likely inflammatory or infectious in etiology. Moderate right pleural effusion shows no significant change. Musculoskeletal:  No suspicious bone lesions identified. CT ABDOMEN AND PELVIS FINDINGS Hepatobiliary: No masses visualized on this unenhanced exam. Gallbladder is unremarkable. No evidence of biliary ductal dilatation. Pancreas: No mass or inflammatory changes identified on this unenhanced exam. Spleen:  Within normal limits in size. Adrenals/Urinary Tract: Normal adrenal glands. No evidence of urolithiasis or hydronephrosis. Multiple fluid attenuation cysts in both kidneys are stable in appearance, largest in lower pole of left kidney measuring approximately 13 cm. Stomach/Bowel: No evidence of obstruction, inflammatory process, or abnormal fluid collections. Diverticulosis is seen mainly involving the sigmoid colon, however there is no evidence of diverticulitis. Vascular/Lymphatic: No pathologically enlarged lymph nodes identified. No abdominal aortic aneurysm. Aortic atherosclerotic calcification noted. Reproductive: Significant beam hardening artifact from left hip prosthesis obscures the inferior pelvis. No masses or other significant abnormality. Other: Stable tiny right lower quadrant ventral abdominal wall hernia which contains only fat. Musculoskeletal:  No suspicious bone lesions identified. IMPRESSION: No significant change in large central right lung  mass, which involves the right hilum. Stable moderate right pleural effusion. New poorly defined areas of airspace disease in the upper lobes, right side greater than left, likely inflammatory or infectious in etiology. Recommend continued attention on follow-up imaging. Stable 4.9 cm ascending thoracic aortic aneurysm. Recommend continued attention on follow-up CT. No evidence of abdominal or pelvic metastatic disease. Colonic diverticulosis, without radiographic evidence of diverticulitis. Stable bilateral renal cysts. Stable tiny right lower quadrant ventral abdominal wall hernia containing only fat. Electronically Signed   By: Marlaine Hind M.D.   On: 03/26/2020 15:32   CT Chest Wo Contrast  Result Date: 03/26/2020 CLINICAL DATA:  Follow-up non-small cell lung carcinoma. Previous radiation therapy and chemotherapy. Ongoing immunotherapy. EXAM: CT CHEST, ABDOMEN AND PELVIS WITHOUT CONTRAST TECHNIQUE: Multidetector CT imaging of the chest, abdomen and pelvis was performed following the standard protocol without IV contrast. COMPARISON:  01/23/2020 FINDINGS: CT CHEST FINDINGS Cardiovascular: No acute findings. 4.9 cm ascending thoracic aortic aneurysm is stable when remeasured in same plane on today's exam. Mediastinum/Lymph Nodes: No pathologically enlarged mediastinal lymph nodes identified. Large central right lung mass involves the right hilum. Lungs/Pleura: A large central right lung mass is  again seen involving the right hilum. This measures 10.0 x 5.8 cm on image 34/2, without significant change in size compared to prior study. Post radiation changes again seen in the central right lung with mild traction bronchiectasis. New poorly defined areas of airspace disease are seen in the posterior and lateral right upper lobe, and new small ill-defined areas of airspace disease are seen in the left upper lobe and superior left lower lobe. These findings are likely inflammatory or infectious in etiology.  Moderate right pleural effusion shows no significant change. Musculoskeletal:  No suspicious bone lesions identified. CT ABDOMEN AND PELVIS FINDINGS Hepatobiliary: No masses visualized on this unenhanced exam. Gallbladder is unremarkable. No evidence of biliary ductal dilatation. Pancreas: No mass or inflammatory changes identified on this unenhanced exam. Spleen:  Within normal limits in size. Adrenals/Urinary Tract: Normal adrenal glands. No evidence of urolithiasis or hydronephrosis. Multiple fluid attenuation cysts in both kidneys are stable in appearance, largest in lower pole of left kidney measuring approximately 13 cm. Stomach/Bowel: No evidence of obstruction, inflammatory process, or abnormal fluid collections. Diverticulosis is seen mainly involving the sigmoid colon, however there is no evidence of diverticulitis. Vascular/Lymphatic: No pathologically enlarged lymph nodes identified. No abdominal aortic aneurysm. Aortic atherosclerotic calcification noted. Reproductive: Significant beam hardening artifact from left hip prosthesis obscures the inferior pelvis. No masses or other significant abnormality. Other: Stable tiny right lower quadrant ventral abdominal wall hernia which contains only fat. Musculoskeletal:  No suspicious bone lesions identified. IMPRESSION: No significant change in large central right lung mass, which involves the right hilum. Stable moderate right pleural effusion. New poorly defined areas of airspace disease in the upper lobes, right side greater than left, likely inflammatory or infectious in etiology. Recommend continued attention on follow-up imaging. Stable 4.9 cm ascending thoracic aortic aneurysm. Recommend continued attention on follow-up CT. No evidence of abdominal or pelvic metastatic disease. Colonic diverticulosis, without radiographic evidence of diverticulitis. Stable bilateral renal cysts. Stable tiny right lower quadrant ventral abdominal wall hernia containing  only fat. Electronically Signed   By: Marlaine Hind M.D.   On: 03/26/2020 15:32    ASSESSMENT AND PLAN: This is a very pleasant 86 years old white female with stage IIIb non-small cell lung cancer, squamous cell carcinoma.  She is currently undergoing a course of concurrent chemoradiation with weekly carboplatin and paclitaxel status post 6 cycles.  She tolerated this treatment well with partial response. The patient also completed the course of consolidation treatment with immunotherapy with Imfinzi status post 26 cycles.  She tolerated her previous treatment well. Her recent CT scan of the chest showed progressive consolidation in the right lung suspicious for disease recurrence but also worsening post radiation effect could not be excluded.  A PET scan showed rounded hypermetabolic soft tissue mass inferior to the right hilum consistent with lung cancer recurrence.  There was also hypermetabolic precarinal lymph node concerning for nodal metastasis but no evidence for metastatic disease in the abdomen or pelvis.  The patient also had groundglass density in the right upper lobe, right middle lobe as well as left lower lobe suspicious for inflammatory process and less likely malignancy. The patient started treatment with systemic chemotherapy with carboplatin for AUC of 5, paclitaxel 175 mg/M2 and Keytruda 200 mg IV every 3 weeks with Neulasta support status post 16 cycles.  Starting from cycle #5 the patient is currently on maintenance treatment with single agent Keytruda every 3 weeks.   The patient has been tolerating this treatment well with  no concerning adverse effects. She had repeat CT scan of the chest, abdomen pelvis performed recently.  I personally and independently reviewed the scan images and discussed the results with the patient and her granddaughter. Her scan showed no concerning findings for disease progression but she has new poorly defined areas of airspace disease in the upper lobes  right side greater than left likely inflammatory in origin and need close monitoring. I recommended for the patient to continue her current treatment with [facility] and she will proceed with cycle #17 today. I will see her back for follow-up visit in 3 weeks for evaluation before the next cycle of her treatment. For the anemia of chronic disease, we will continue to monitor her hemoglobin and hematocrit closely and consider the patient for PRBCs transfusion if her hemoglobin is less than 8. The patient was advised to call immediately if she has any concerning symptoms in the interval. The patient voices understanding of current disease status and treatment options and is in agreement with the current care plan. All questions were answered. The patient knows to call the clinic with any problems, questions or concerns. We can certainly see the patient much sooner if necessary.  Disclaimer: This note was dictated with voice recognition software. Similar sounding words can inadvertently be transcribed and may not be corrected upon review.

## 2020-03-28 NOTE — Progress Notes (Signed)
Pt's creatnine is 1.53 today, ok to treat per Dr. Julien Nordmann.

## 2020-03-28 NOTE — Telephone Encounter (Signed)
Scheduled appt per 12/30 los - pt to get an updated schedule next visit

## 2020-04-06 ENCOUNTER — Other Ambulatory Visit: Payer: Self-pay | Admitting: Physician Assistant

## 2020-04-06 DIAGNOSIS — R630 Anorexia: Secondary | ICD-10-CM

## 2020-04-06 DIAGNOSIS — C3491 Malignant neoplasm of unspecified part of right bronchus or lung: Secondary | ICD-10-CM

## 2020-04-08 ENCOUNTER — Other Ambulatory Visit: Payer: Self-pay | Admitting: Physician Assistant

## 2020-04-08 DIAGNOSIS — R062 Wheezing: Secondary | ICD-10-CM

## 2020-04-08 DIAGNOSIS — C3491 Malignant neoplasm of unspecified part of right bronchus or lung: Secondary | ICD-10-CM

## 2020-04-08 MED ORDER — ALBUTEROL SULFATE HFA 108 (90 BASE) MCG/ACT IN AERS: 2.0000 | INHALATION_SPRAY | Freq: Four times a day (QID) | RESPIRATORY_TRACT | 0 refills | Status: AC | PRN

## 2020-04-16 NOTE — Progress Notes (Signed)
[place] OFFICE PROGRESS NOTE  Lisa Ghent, MD [place] [id number]  DIAGNOSIS: Recurrent non-small cell lung cancer initially diagnosed stage IIIB (T3, N3, M0) non-small cell lung cancer, squamous cell carcinoma presented with large right middle lobe lung mass in addition to right hilar, subcarinal and right supraclavicular lymphadenopathy diagnosed in August 2019.  The patient had evidence for disease progression in December 2020.  PRIOR THERAPY: 1) Concurrent chemoradiation with weekly carboplatin for AUC of 2 and paclitaxel 45 NG/M2.  Status post 6 cycles with partial response. 2) Consolidation immunotherapy with Imfinzi 10 mg/KG every 2 weeks started February 28, 2018.  Status post 26 cycles.  Last cycle was given on February 21, 2019  CURRENT THERAPY: Systemic chemotherapy with carboplatin for an AUC of 5, paclitaxel 175 mg/m and Keytruda 200 mg IV every 3 weeks with Neulasta support. First dose on April 05, 2018.Status post17cycles.Starting from cycle #5 she will be on single agent maintenance Keytruda  INTERVAL HISTORY: Lisa [name] 87 y.o. female returns to the clinic today forafollow-up visit. The patient is feeling welltoday without any concerning complaints except she is a little sleepy/tired this morning.The patient continues to tolerate single agent Keytruda well without any concerning adverse side effects. She denies any recent fever, chills, or night sweats.Her weight is stable.The patient is very pleasant. She has some memory deficits at baseline. At her last appointment, her scan noted some inflammatory changes. When asked today, the patient denies changes with her breathing in the interval since her last treatment. Denies shortness of breath, chest pain, or cough. I refilled her albuterol in the interval since her last appointment. Her oxygen saturation is 100% on room air. She denies any nausea, vomiting, diarrhea, or  constipation. She denies any headache or visual changes. She denies rashes or skin changes. She denies any bleeding or bruising. The patient is here today for evaluation before starting cycle #18.  MEDICAL HISTORY: Past Medical History:  Diagnosis Date  . Diverticulosis   . Headache   . Hemorrhoids    internal and external  . Hyperlipidemia 01/29/1991  . Hypertension 10/29/1995  . Hypothyroidism 10/29/1995  . Lung mass    right middle lobe  . Melanoma ([place])    h/o, local excision. no chemo or rady.   . Skin cancer (melanoma) ([place])   . Tubular adenoma of colon   . Wears dentures     ALLERGIES:  is allergic to atorvastatin, celebrex [celecoxib], cholecalciferol, simvastatin, and tape.  MEDICATIONS:  Current Outpatient Medications  Medication Sig Dispense Refill  . albuterol (VENTOLIN HFA) 108 (90 Base) MCG/ACT inhaler Inhale 2 puffs into the lungs every 6 (six) hours as needed for wheezing or shortness of breath. 6.7 g 0  . amLODipine (NORVASC) 10 MG tablet Take 1 tablet by mouth once daily 90 tablet 0  . Cholecalciferol (VITAMIN D3) 50 MCG (2000 UT) TABS Take 2,000 Units by mouth 3 (three) times a week.    . Coenzyme Q10 100 MG capsule Take 100 mg by mouth daily.     Marland Kitchen docusate sodium (COLACE) 100 MG capsule Take 100 mg by mouth daily as needed for mild constipation.    . fluticasone (FLONASE) 50 MCG/ACT nasal spray USE 2 SPRAYS IN EACH NOSTRIL DAILY AS NEEDED FOR ALLERGIES 48 g 3  . levothyroxine (SYNTHROID) 75 MCG tablet Take 1 tablet by mouth once daily 90 tablet 1  . loratadine (CLARITIN) 10 MG tablet Take 1 tablet (10 mg  total) by mouth daily as needed for allergies or rhinitis.    . mirtazapine (REMERON) 7.5 MG tablet TAKE 1 TABLET BY MOUTH ONCE DAILY AT BEDTIME 30 tablet 0  . Multiple Vitamin (MULTIVITAMIN WITH MINERALS) TABS tablet Take 1 tablet by mouth daily.    . sennosides-docusate sodium (SENOKOT-S) 8.6-50 MG tablet Take 1 tablet by mouth daily as needed for  constipation.     No current facility-administered medications for this visit.   Facility-Administered Medications Ordered in Other Visits  Medication Dose Route Frequency Provider Last Rate Last Admin  . acetaminophen (TYLENOL) tablet 650 mg  650 mg Oral Once Nicholas Lose, MD        SURGICAL HISTORY:  Past Surgical History:  Procedure Laterality Date  . BRONCHIAL BIOPSY  11/23/2017   Procedure: BRONCHIAL BIOPSIES;  Surgeon: Grace Isaac, MD;  Location: [facility] OR;  Service: Thoracic;;  . cataract surgery  2007, 2008   repair bilat  . DG BARIUM ENEMA ([place] HX) N/A 2016   [name]  . HERNIA REPAIR  04/25/2001  . HIP ARTHROPLASTY Left 05/31/2019   Procedure: LEFT HIP HEMIARTHROPLASTY;  Surgeon: Hiram Gash, MD;  Location: [facility];  Service: Orthopedics;  Laterality: Left;  . IR IMAGING GUIDED PORT INSERTION  07/05/2018  . IR US GUIDE BX ASP/DRAIN  11/17/2017  . KNEE SURGERY     meniscus tear per Dr. Ronnie Derby, R knee  . MULTIPLE TOOTH EXTRACTIONS    . SEPTOPLASTY  2006   Dr. Ernesto Rutherford  . SKIN CANCER EXCISION    . TONSILLECTOMY  1954  . VAGINAL HYSTERECTOMY    . VIDEO BRONCHOSCOPY WITH ENDOBRONCHIAL ULTRASOUND N/A 11/23/2017   Procedure: VIDEO BRONCHOSCOPY WITH ENDOBRONCHIAL ULTRASOUND;  Surgeon: Grace Isaac, MD;  Location: [facility];  Service: Thoracic;  Laterality: N/A;    REVIEW OF SYSTEMS:   Constitutional: Positive for fatigue. Negative for appetite change, chills, fever and unexpected weight change.  HENT: Negative for mouth sores, nosebleeds, sore throat and trouble swallowing.  Eyes: Negative for eye problems and icterus.  Respiratory:Negative for cough, shortness of breath, hemoptysis, and wheezing.  Cardiovascular:Positive for bilateral baseline lower extremity swelling.Negative for chest pain. Gastrointestinal: Negative for abdominal pain, constipation, diarrhea, nausea and vomiting.  Genitourinary: Negative for bladder incontinence, difficulty urinating, dysuria,  frequency and hematuria.  Musculoskeletal: Negative for back pain, gait problem, neck pain and neck stiffness.  Skin:Negative for itching and rash.  Neurological: Negative for dizziness, extremity weakness, gait problem, headaches, light-headedness and seizures.  Hematological: Negative for adenopathy. Does not bruise/bleed easily.  Psychiatric/Behavioral:Positive for baseline dementia/memory impairment.Negative for confusion, depression and sleep disturbance. The patient is not nervous/anxious.     PHYSICAL EXAMINATION:  Blood pressure 135/77, pulse 78, temperature 97.7 F (36.5 C), temperature source Tympanic, resp. rate 13, weight 153 lb (69.4 kg), SpO2 100 %.  ECOG PERFORMANCE STATUS: 2 - Symptomatic, <50% confined to bed  Physical Exam  Constitutional:Elderly appearing femaleand in no distress.  HENT:  Head: Normocephalic and atraumatic.  Mouth/Throat: Oropharynx is clear and moist. No oropharyngeal exudate.  Eyes: Conjunctivae are normal. Right eye exhibits no discharge. Left eye exhibits no discharge. No scleral icterus.  Neck: Normal range of motion. Neck supple.  Cardiovascular:Normal rate, regular rhythm, normal heart sounds and intact distal pulses.  Pulmonary/Chest: Effort normal. Lungs clear to ausculation bilaterally. No respiratory distress. No rales.  Abdominal: Soft. Bowel sounds are normal. Exhibits no distension and no mass. There is no tenderness.  Musculoskeletal: Stable bilateral lower extremity swelling.Normal range  of motion.  Lymphadenopathy:  No cervical adenopathy.  Neurological: Exhibits normal muscle tone. Examined in the wheelchair.   Skin: Skin is warm and dry. No rash noted. Not diaphoretic. No erythema. No pallor.  Psychiatric: Mood and judgment normal.Has some memory deficits. Vitals reviewed.  LABORATORY DATA: Lab Results  Component Value Date   WBC 6.1 04/18/2020   HGB 7.4 (L) 04/18/2020   HCT 25.3 (L) 04/18/2020   MCV 91.3  04/18/2020   PLT 146 (L) 04/18/2020      Chemistry      Component Value Date/Time   NA 143 03/28/2020 0816   K 3.9 03/28/2020 0816   CL 107 03/28/2020 0816   CO2 30 03/28/2020 0816   BUN 24 (H) 03/28/2020 0816   CREATININE 1.53 (H) 03/28/2020 0816      Component Value Date/Time   CALCIUM 9.7 03/28/2020 0816   ALKPHOS 64 03/28/2020 0816   AST 18 03/28/2020 0816   ALT 12 03/28/2020 0816   BILITOT 0.3 03/28/2020 0816       RADIOGRAPHIC STUDIES:  CT Abdomen Pelvis Wo Contrast  Result Date: 03/26/2020 CLINICAL DATA:  Follow-up non-small cell lung carcinoma. Previous radiation therapy and chemotherapy. Ongoing immunotherapy. EXAM: CT CHEST, ABDOMEN AND PELVIS WITHOUT CONTRAST TECHNIQUE: Multidetector CT imaging of the chest, abdomen and pelvis was performed following the standard protocol without IV contrast. COMPARISON:  01/23/2020 FINDINGS: CT CHEST FINDINGS Cardiovascular: No acute findings. 4.9 cm ascending thoracic aortic aneurysm is stable when remeasured in same plane on today's exam. Mediastinum/Lymph Nodes: No pathologically enlarged mediastinal lymph nodes identified. Large central right lung mass involves the right hilum. Lungs/Pleura: A large central right lung mass is again seen involving the right hilum. This measures 10.0 x 5.8 cm on image 34/2, without significant change in size compared to prior study. Post radiation changes again seen in the central right lung with mild traction bronchiectasis. New poorly defined areas of airspace disease are seen in the posterior and lateral right upper lobe, and new small ill-defined areas of airspace disease are seen in the left upper lobe and superior left lower lobe. These findings are likely inflammatory or infectious in etiology. Moderate right pleural effusion shows no significant change. Musculoskeletal:  No suspicious bone lesions identified. CT ABDOMEN AND PELVIS FINDINGS Hepatobiliary: No masses visualized on this unenhanced exam.  Gallbladder is unremarkable. No evidence of biliary ductal dilatation. Pancreas: No mass or inflammatory changes identified on this unenhanced exam. Spleen:  Within normal limits in size. Adrenals/Urinary Tract: Normal adrenal glands. No evidence of urolithiasis or hydronephrosis. Multiple fluid attenuation cysts in both kidneys are stable in appearance, largest in lower pole of left kidney measuring approximately 13 cm. Stomach/Bowel: No evidence of obstruction, inflammatory process, or abnormal fluid collections. Diverticulosis is seen mainly involving the sigmoid colon, however there is no evidence of diverticulitis. Vascular/Lymphatic: No pathologically enlarged lymph nodes identified. No abdominal aortic aneurysm. Aortic atherosclerotic calcification noted. Reproductive: Significant beam hardening artifact from left hip prosthesis obscures the inferior pelvis. No masses or other significant abnormality. Other: Stable tiny right lower quadrant ventral abdominal wall hernia which contains only fat. Musculoskeletal:  No suspicious bone lesions identified. IMPRESSION: No significant change in large central right lung mass, which involves the right hilum. Stable moderate right pleural effusion. New poorly defined areas of airspace disease in the upper lobes, right side greater than left, likely inflammatory or infectious in etiology. Recommend continued attention on follow-up imaging. Stable 4.9 cm ascending thoracic aortic aneurysm. Recommend continued attention on  follow-up CT. No evidence of abdominal or pelvic metastatic disease. Colonic diverticulosis, without radiographic evidence of diverticulitis. Stable bilateral renal cysts. Stable tiny right lower quadrant ventral abdominal wall hernia containing only fat. Electronically Signed   By: Marlaine Hind M.D.   On: 03/26/2020 15:32   CT Chest Wo Contrast  Result Date: 03/26/2020 CLINICAL DATA:  Follow-up non-small cell lung carcinoma. Previous radiation  therapy and chemotherapy. Ongoing immunotherapy. EXAM: CT CHEST, ABDOMEN AND PELVIS WITHOUT CONTRAST TECHNIQUE: Multidetector CT imaging of the chest, abdomen and pelvis was performed following the standard protocol without IV contrast. COMPARISON:  01/23/2020 FINDINGS: CT CHEST FINDINGS Cardiovascular: No acute findings. 4.9 cm ascending thoracic aortic aneurysm is stable when remeasured in same plane on today's exam. Mediastinum/Lymph Nodes: No pathologically enlarged mediastinal lymph nodes identified. Large central right lung mass involves the right hilum. Lungs/Pleura: A large central right lung mass is again seen involving the right hilum. This measures 10.0 x 5.8 cm on image 34/2, without significant change in size compared to prior study. Post radiation changes again seen in the central right lung with mild traction bronchiectasis. New poorly defined areas of airspace disease are seen in the posterior and lateral right upper lobe, and new small ill-defined areas of airspace disease are seen in the left upper lobe and superior left lower lobe. These findings are likely inflammatory or infectious in etiology. Moderate right pleural effusion shows no significant change. Musculoskeletal:  No suspicious bone lesions identified. CT ABDOMEN AND PELVIS FINDINGS Hepatobiliary: No masses visualized on this unenhanced exam. Gallbladder is unremarkable. No evidence of biliary ductal dilatation. Pancreas: No mass or inflammatory changes identified on this unenhanced exam. Spleen:  Within normal limits in size. Adrenals/Urinary Tract: Normal adrenal glands. No evidence of urolithiasis or hydronephrosis. Multiple fluid attenuation cysts in both kidneys are stable in appearance, largest in lower pole of left kidney measuring approximately 13 cm. Stomach/Bowel: No evidence of obstruction, inflammatory process, or abnormal fluid collections. Diverticulosis is seen mainly involving the sigmoid colon, however there is no  evidence of diverticulitis. Vascular/Lymphatic: No pathologically enlarged lymph nodes identified. No abdominal aortic aneurysm. Aortic atherosclerotic calcification noted. Reproductive: Significant beam hardening artifact from left hip prosthesis obscures the inferior pelvis. No masses or other significant abnormality. Other: Stable tiny right lower quadrant ventral abdominal wall hernia which contains only fat. Musculoskeletal:  No suspicious bone lesions identified. IMPRESSION: No significant change in large central right lung mass, which involves the right hilum. Stable moderate right pleural effusion. New poorly defined areas of airspace disease in the upper lobes, right side greater than left, likely inflammatory or infectious in etiology. Recommend continued attention on follow-up imaging. Stable 4.9 cm ascending thoracic aortic aneurysm. Recommend continued attention on follow-up CT. No evidence of abdominal or pelvic metastatic disease. Colonic diverticulosis, without radiographic evidence of diverticulitis. Stable bilateral renal cysts. Stable tiny right lower quadrant ventral abdominal wall hernia containing only fat. Electronically Signed   By: Marlaine Hind M.D.   On: 03/26/2020 15:32     ASSESSMENT/PLAN:  This is a very pleasant 87 year old Caucasian female withrecurrent non-small cell lung cancer initially diagnosed stage IIIB (T3, N3, M0) non-small cell lung cancer, squamous cell carcinoma. She presented with large right middle lobe lung mass in addition to right hilar, subcarinal and right supraclavicular lymphadenopathy diagnosed in August 2019.The patient had evidence for disease progression in December 2020.  She completed a course of concurrent chemoradiation with carboplatin for an AUC of 2 and paclitaxel 45 mg/m. She is status  post 6 cycles with a partial response.  She then completed consolidation immunotherapy with Imfinzi 10 mg/kg IV every 2 weeks. She is status post 26  cycles. She showed evidence of disease progression on her restaging CT scan.  The patient is currently undergoing treatment with carboplatin for an AUC of 5, paclitaxel 175 mg/m, Keytruda 200 mg IV every 3 weeks with Neulasta support.She is status post17cycles. She was hospitalized for a left hip fracturein March 2021. Her treatment was on hold while she was recovering.Startingfrom cycle #5, the patientstartedmaintenance treatment with single agent immunotherapy withKeytruda 200 mg IV every 3 weeks.  Labs were reviewed. Her labs show anemia with a Hbg of 7.4. We will arrange for 1 unit of blood to be given today. She denies any abnormal bleeding. Recommend that she also proceed with cycle #18 today of Keytruda today as scheduled.   We will see her back for a follow up visit in 3 weeks for evaluation before starting cycle #19.   The patient was advised to call immediately if she has any concerning symptoms in the interval. The patient voices understanding of current disease status and treatment options and is in agreement with the current care plan. All questions were answered. The patient knows to call the clinic with any problems, questions or concerns. We can certainly see the patient much sooner if necessary    Orders Placed This Encounter  Procedures  . TSH    Standing Status:   Standing    Number of Occurrences:   10    Standing Expiration Date:   04/18/2021  . Informed Consent Details: Physician/Practitioner Attestation; Transcribe to consent form and obtain patient signature    Standing Status:   Future    Standing Expiration Date:   04/18/2021    Order Specific Question:   Physician/Practitioner attestation of informed consent for blood and or blood product transfusion    Answer:   I, the physician/practitioner, attest that I have discussed with the patient the benefits, risks, side effects, alternatives, likelihood of achieving goals and potential problems during recovery  for the procedure that I have provided informed consent.    Order Specific Question:   Product(s)    Answer:   All Product(s)  . Care order/instruction    Transfuse Parameters    Standing Status:   Future    Standing Expiration Date:   04/18/2021  . Type and screen    Standing Status:   Future    Standing Expiration Date:   04/18/2021     I spent 30-39 minutes for this encounter.   Nasri Boakye L Malania Gawthrop, PA-C 04/18/20

## 2020-04-18 ENCOUNTER — Inpatient Hospital Stay (HOSPITAL_BASED_OUTPATIENT_CLINIC_OR_DEPARTMENT_OTHER): Payer: Medicare Other | Admitting: Physician Assistant

## 2020-04-18 ENCOUNTER — Inpatient Hospital Stay: Payer: Medicare Other

## 2020-04-18 ENCOUNTER — Other Ambulatory Visit: Payer: Self-pay | Admitting: Physician Assistant

## 2020-04-18 ENCOUNTER — Other Ambulatory Visit: Payer: Self-pay

## 2020-04-18 ENCOUNTER — Encounter: Payer: Self-pay | Admitting: Physician Assistant

## 2020-04-18 ENCOUNTER — Inpatient Hospital Stay: Payer: Medicare Other | Attending: Internal Medicine

## 2020-04-18 VITALS — BP 124/69 | HR 78 | Temp 98.0°F | Resp 22

## 2020-04-18 VITALS — BP 135/77 | HR 78 | Temp 97.7°F | Resp 13 | Wt 153.0 lb

## 2020-04-18 DIAGNOSIS — C3491 Malignant neoplasm of unspecified part of right bronchus or lung: Secondary | ICD-10-CM

## 2020-04-18 DIAGNOSIS — C342 Malignant neoplasm of middle lobe, bronchus or lung: Secondary | ICD-10-CM | POA: Insufficient documentation

## 2020-04-18 DIAGNOSIS — D649 Anemia, unspecified: Secondary | ICD-10-CM

## 2020-04-18 DIAGNOSIS — Z5112 Encounter for antineoplastic immunotherapy: Secondary | ICD-10-CM | POA: Diagnosis not present

## 2020-04-18 DIAGNOSIS — Z95828 Presence of other vascular implants and grafts: Secondary | ICD-10-CM

## 2020-04-18 LAB — CBC WITH DIFFERENTIAL (CANCER CENTER ONLY)
Abs Immature Granulocytes: 0.03 10*3/uL (ref 0.00–0.07)
Basophils Absolute: 0 10*3/uL (ref 0.0–0.1)
Basophils Relative: 0 %
Eosinophils Absolute: 0 10*3/uL (ref 0.0–0.5)
Eosinophils Relative: 1 %
HCT: 25.3 % — ABNORMAL LOW (ref 36.0–46.0)
Hemoglobin: 7.4 g/dL — ABNORMAL LOW (ref 12.0–15.0)
Immature Granulocytes: 1 %
Lymphocytes Relative: 9 %
Lymphs Abs: 0.5 10*3/uL — ABNORMAL LOW (ref 0.7–4.0)
MCH: 26.7 pg (ref 26.0–34.0)
MCHC: 29.2 g/dL — ABNORMAL LOW (ref 30.0–36.0)
MCV: 91.3 fL (ref 80.0–100.0)
Monocytes Absolute: 0.3 10*3/uL (ref 0.1–1.0)
Monocytes Relative: 5 %
Neutro Abs: 5.2 10*3/uL (ref 1.7–7.7)
Neutrophils Relative %: 84 %
Platelet Count: 146 10*3/uL — ABNORMAL LOW (ref 150–400)
RBC: 2.77 MIL/uL — ABNORMAL LOW (ref 3.87–5.11)
RDW: 16 % — ABNORMAL HIGH (ref 11.5–15.5)
WBC Count: 6.1 10*3/uL (ref 4.0–10.5)
nRBC: 0 % (ref 0.0–0.2)

## 2020-04-18 LAB — CMP (CANCER CENTER ONLY)
ALT: 10 U/L (ref 0–44)
AST: 15 U/L (ref 15–41)
Albumin: 2.5 g/dL — ABNORMAL LOW (ref 3.5–5.0)
Alkaline Phosphatase: 75 U/L (ref 38–126)
Anion gap: 8 (ref 5–15)
BUN: 23 mg/dL (ref 8–23)
CO2: 28 mmol/L (ref 22–32)
Calcium: 8.8 mg/dL — ABNORMAL LOW (ref 8.9–10.3)
Chloride: 105 mmol/L (ref 98–111)
Creatinine: 1.43 mg/dL — ABNORMAL HIGH (ref 0.44–1.00)
GFR, Estimated: 36 mL/min — ABNORMAL LOW (ref >60)
Glucose, Bld: 95 mg/dL (ref 70–99)
Potassium: 3.8 mmol/L (ref 3.5–5.1)
Sodium: 141 mmol/L (ref 135–145)
Total Bilirubin: 0.4 mg/dL (ref 0.3–1.2)
Total Protein: 6 g/dL — ABNORMAL LOW (ref 6.5–8.1)

## 2020-04-18 LAB — PREPARE RBC (CROSSMATCH)

## 2020-04-18 MED ORDER — ACETAMINOPHEN 325 MG PO TABS
ORAL_TABLET | ORAL | Status: AC
  Filled 2020-04-18: qty 2

## 2020-04-18 MED ORDER — SODIUM CHLORIDE 0.9 % IV SOLN
200.0000 mg | Freq: Once | INTRAVENOUS | Status: AC
  Administered 2020-04-18: 200 mg via INTRAVENOUS
  Filled 2020-04-18: qty 8

## 2020-04-18 MED ORDER — DIPHENHYDRAMINE HCL 25 MG PO CAPS
ORAL_CAPSULE | ORAL | Status: AC
  Filled 2020-04-18: qty 1

## 2020-04-18 MED ORDER — ACETAMINOPHEN 325 MG PO TABS
650.0000 mg | ORAL_TABLET | Freq: Once | ORAL | Status: AC
  Administered 2020-04-18: 650 mg via ORAL

## 2020-04-18 MED ORDER — SODIUM CHLORIDE 0.9 % IV SOLN
Freq: Once | INTRAVENOUS | Status: AC
  Filled 2020-04-18: qty 250

## 2020-04-18 MED ORDER — DIPHENHYDRAMINE HCL 25 MG PO CAPS
25.0000 mg | ORAL_CAPSULE | Freq: Once | ORAL | Status: AC
  Administered 2020-04-18: 25 mg via ORAL

## 2020-04-18 MED ORDER — SODIUM CHLORIDE 0.9% FLUSH
10.0000 mL | INTRAVENOUS | Status: DC | PRN
  Administered 2020-04-18: 10 mL
  Filled 2020-04-18: qty 10

## 2020-04-18 MED ORDER — HEPARIN SOD (PORK) LOCK FLUSH 100 UNIT/ML IV SOLN
500.0000 [IU] | Freq: Once | INTRAVENOUS | Status: AC | PRN
  Administered 2020-04-18: 500 [IU]
  Filled 2020-04-18: qty 5

## 2020-04-18 MED ORDER — PEGFILGRASTIM-CBQV 6 MG/0.6ML ~~LOC~~ SOSY
PREFILLED_SYRINGE | SUBCUTANEOUS | Status: AC
  Filled 2020-04-18: qty 0.6

## 2020-04-18 MED ORDER — SODIUM CHLORIDE 0.9% FLUSH
10.0000 mL | Freq: Once | INTRAVENOUS | Status: AC
  Administered 2020-04-18: 10 mL
  Filled 2020-04-18: qty 10

## 2020-04-18 MED ORDER — SODIUM CHLORIDE 0.9% IV SOLUTION
250.0000 mL | Freq: Once | INTRAVENOUS | Status: AC
  Administered 2020-04-18: 250 mL via INTRAVENOUS
  Filled 2020-04-18: qty 250

## 2020-04-18 NOTE — Progress Notes (Signed)
Per Cassie Hellingoetter PA ok for treatment today with Hemoglobin 7.4  pt. to receive 1 unit of blood and do not have to wait on CMP.

## 2020-04-18 NOTE — Patient Instructions (Addendum)
[facility] Discharge Instructions for Patients Receiving Chemotherapy  Today you received the following immunotherapy agent: Pembrolizumab Beryle Flock)  To help prevent nausea and vomiting after your treatment, we encourage you to take your nausea medication as directed by your MD.   If you develop nausea and vomiting that is not controlled by your nausea medication, call the clinic.   BELOW ARE SYMPTOMS THAT SHOULD BE REPORTED IMMEDIATELY:  *FEVER GREATER THAN 100.5 F  *CHILLS WITH OR WITHOUT FEVER  NAUSEA AND VOMITING THAT IS NOT CONTROLLED WITH YOUR NAUSEA MEDICATION  *UNUSUAL SHORTNESS OF BREATH  *UNUSUAL BRUISING OR BLEEDING  TENDERNESS IN MOUTH AND THROAT WITH OR WITHOUT PRESENCE OF ULCERS  *URINARY PROBLEMS  *BOWEL PROBLEMS  UNUSUAL RASH Items with * indicate a potential emergency and should be followed up as soon as possible.  Feel free to call the clinic should you have any questions or concerns. The clinic phone number is [phone].  Please show the [place] at check-in to the Emergency Department and triage nurse.   Blood Transfusion, Adult, Care After This sheet gives you information about how to care for yourself after your procedure. Your doctor may also give you more specific instructions. If you have problems or questions, contact your doctor. What can I expect after the procedure? After the procedure, it is common to have:  Bruising and soreness at the IV site.  A fever or chills on the day of the procedure. This may be your body's response to the new blood cells received.  A headache. Follow these instructions at home: Insertion site care  Follow instructions from your doctor about how to take care of your insertion site. This is where an IV tube was put into your vein. Make sure you: ? Wash your hands with soap and water before and after you change your bandage (dressing). If you cannot use soap and water, use hand  sanitizer. ? Change your bandage as told by your doctor.  Check your insertion site every day for signs of infection. Check for: ? Redness, swelling, or pain. ? Bleeding from the site. ? Warmth. ? Pus or a bad smell.      General instructions  Take over-the-counter and prescription medicines only as told by your doctor.  Rest as told by your doctor.  Go back to your normal activities as told by your doctor.  Keep all follow-up visits as told by your doctor. This is important. Contact a doctor if:  You have itching or red, swollen areas of skin (hives).  You feel worried or nervous (anxious).  You feel weak after doing your normal activities.  You have redness, swelling, warmth, or pain around the insertion site.  You have blood coming from the insertion site, and the blood does not stop with pressure.  You have pus or a bad smell coming from the insertion site. Get help right away if:  You have signs of a serious reaction. This may be coming from an allergy or the body's defense system (immune system). Signs include: ? Trouble breathing or shortness of breath. ? Swelling of the face or feeling warm (flushed). ? Fever or chills. ? Head, chest, or back pain. ? Dark pee (urine) or blood in the pee. ? Widespread rash. ? Fast heartbeat. ? Feeling dizzy or light-headed. You may receive your blood transfusion in an outpatient setting. If so, you will be told whom to contact to report any reactions. These symptoms may be an emergency.  Do not wait to see if the symptoms will go away. Get medical help right away. Call your local emergency services (911 in the [place] Do not drive yourself to the hospital. Summary  Bruising and soreness at the IV site are common.  Check your insertion site every day for signs of infection.  Rest as told by your doctor. Go back to your normal activities as told by your doctor.  Get help right away if you have signs of a serious reaction. This  information is not intended to replace advice given to you by your health care provider. Make sure you discuss any questions you have with your health care provider. Document Revised: 09/08/2018 Document Reviewed: 09/08/2018 Elsevier Patient Education  [place].

## 2020-04-19 ENCOUNTER — Telehealth: Payer: Self-pay | Admitting: Physician Assistant

## 2020-04-19 LAB — TYPE AND SCREEN
ABO/RH(D): O NEG
Antibody Screen: NEGATIVE
Unit division: 0

## 2020-04-19 LAB — BPAM RBC
Blood Product Expiration Date: 202202102359
ISSUE DATE / TIME: 202201201346
Unit Type and Rh: 9500

## 2020-04-19 NOTE — Telephone Encounter (Signed)
Scheduled appointments per 1/20 los. Will have updated calendar printed for patient.

## 2020-05-09 ENCOUNTER — Other Ambulatory Visit: Payer: Self-pay

## 2020-05-09 ENCOUNTER — Inpatient Hospital Stay: Payer: Medicare Other

## 2020-05-09 ENCOUNTER — Inpatient Hospital Stay: Payer: Medicare Other | Attending: Internal Medicine | Admitting: Internal Medicine

## 2020-05-09 ENCOUNTER — Ambulatory Visit (HOSPITAL_COMMUNITY)
Admission: RE | Admit: 2020-05-09 | Discharge: 2020-05-09 | Disposition: A | Discharge status: Home / Self Care | Payer: Medicare Other | Source: Ambulatory Visit | Attending: Internal Medicine | Admitting: Internal Medicine

## 2020-05-09 ENCOUNTER — Other Ambulatory Visit: Payer: Self-pay | Admitting: Internal Medicine

## 2020-05-09 ENCOUNTER — Telehealth: Payer: Self-pay

## 2020-05-09 VITALS — BP 146/82 | HR 69 | Temp 97.4°F | Resp 18 | Ht 67.0 in | Wt 166.1 lb

## 2020-05-09 DIAGNOSIS — E039 Hypothyroidism, unspecified: Secondary | ICD-10-CM | POA: Insufficient documentation

## 2020-05-09 DIAGNOSIS — Z85820 Personal history of malignant melanoma of skin: Secondary | ICD-10-CM | POA: Diagnosis not present

## 2020-05-09 DIAGNOSIS — E785 Hyperlipidemia, unspecified: Secondary | ICD-10-CM | POA: Insufficient documentation

## 2020-05-09 DIAGNOSIS — Z79899 Other long term (current) drug therapy: Secondary | ICD-10-CM | POA: Insufficient documentation

## 2020-05-09 DIAGNOSIS — D638 Anemia in other chronic diseases classified elsewhere: Secondary | ICD-10-CM | POA: Insufficient documentation

## 2020-05-09 DIAGNOSIS — R064 Hyperventilation: Secondary | ICD-10-CM | POA: Diagnosis not present

## 2020-05-09 DIAGNOSIS — Z5112 Encounter for antineoplastic immunotherapy: Secondary | ICD-10-CM

## 2020-05-09 DIAGNOSIS — M6281 Muscle weakness (generalized): Secondary | ICD-10-CM | POA: Insufficient documentation

## 2020-05-09 DIAGNOSIS — R600 Localized edema: Secondary | ICD-10-CM | POA: Insufficient documentation

## 2020-05-09 DIAGNOSIS — R5383 Other fatigue: Secondary | ICD-10-CM | POA: Diagnosis not present

## 2020-05-09 DIAGNOSIS — I110 Hypertensive heart disease with heart failure: Secondary | ICD-10-CM | POA: Insufficient documentation

## 2020-05-09 DIAGNOSIS — J90 Pleural effusion, not elsewhere classified: Secondary | ICD-10-CM | POA: Diagnosis not present

## 2020-05-09 DIAGNOSIS — C3491 Malignant neoplasm of unspecified part of right bronchus or lung: Secondary | ICD-10-CM

## 2020-05-09 DIAGNOSIS — Z95828 Presence of other vascular implants and grafts: Secondary | ICD-10-CM

## 2020-05-09 DIAGNOSIS — C342 Malignant neoplasm of middle lobe, bronchus or lung: Secondary | ICD-10-CM | POA: Insufficient documentation

## 2020-05-09 DIAGNOSIS — R630 Anorexia: Secondary | ICD-10-CM | POA: Insufficient documentation

## 2020-05-09 DIAGNOSIS — R531 Weakness: Secondary | ICD-10-CM | POA: Insufficient documentation

## 2020-05-09 DIAGNOSIS — C3490 Malignant neoplasm of unspecified part of unspecified bronchus or lung: Secondary | ICD-10-CM | POA: Diagnosis not present

## 2020-05-09 DIAGNOSIS — R609 Edema, unspecified: Secondary | ICD-10-CM

## 2020-05-09 LAB — CMP (CANCER CENTER ONLY)
ALT: 11 U/L (ref 0–44)
AST: 18 U/L (ref 15–41)
Albumin: 2.6 g/dL — ABNORMAL LOW (ref 3.5–5.0)
Alkaline Phosphatase: 86 U/L (ref 38–126)
Anion gap: 6 (ref 5–15)
BUN: 27 mg/dL — ABNORMAL HIGH (ref 8–23)
CO2: 30 mmol/L (ref 22–32)
Calcium: 9 mg/dL (ref 8.9–10.3)
Chloride: 107 mmol/L (ref 98–111)
Creatinine: 1.33 mg/dL — ABNORMAL HIGH (ref 0.44–1.00)
GFR, Estimated: 39 mL/min — ABNORMAL LOW (ref >60)
Glucose, Bld: 95 mg/dL (ref 70–99)
Potassium: 3.5 mmol/L (ref 3.5–5.1)
Sodium: 143 mmol/L (ref 135–145)
Total Bilirubin: 0.3 mg/dL (ref 0.3–1.2)
Total Protein: 6.3 g/dL — ABNORMAL LOW (ref 6.5–8.1)

## 2020-05-09 LAB — CBC WITH DIFFERENTIAL (CANCER CENTER ONLY)
Abs Immature Granulocytes: 0.03 10*3/uL (ref 0.00–0.07)
Basophils Absolute: 0 10*3/uL (ref 0.0–0.1)
Basophils Relative: 1 %
Eosinophils Absolute: 0 10*3/uL (ref 0.0–0.5)
Eosinophils Relative: 1 %
HCT: 28.7 % — ABNORMAL LOW (ref 36.0–46.0)
Hemoglobin: 8.5 g/dL — ABNORMAL LOW (ref 12.0–15.0)
Immature Granulocytes: 1 %
Lymphocytes Relative: 10 %
Lymphs Abs: 0.6 10*3/uL — ABNORMAL LOW (ref 0.7–4.0)
MCH: 27.4 pg (ref 26.0–34.0)
MCHC: 29.6 g/dL — ABNORMAL LOW (ref 30.0–36.0)
MCV: 92.6 fL (ref 80.0–100.0)
Monocytes Absolute: 0.4 10*3/uL (ref 0.1–1.0)
Monocytes Relative: 6 %
Neutro Abs: 5.3 10*3/uL (ref 1.7–7.7)
Neutrophils Relative %: 81 %
Platelet Count: 132 10*3/uL — ABNORMAL LOW (ref 150–400)
RBC: 3.1 MIL/uL — ABNORMAL LOW (ref 3.87–5.11)
RDW: 17.1 % — ABNORMAL HIGH (ref 11.5–15.5)
WBC Count: 6.4 10*3/uL (ref 4.0–10.5)
nRBC: 0 % (ref 0.0–0.2)

## 2020-05-09 LAB — TSH: TSH: 6.846 u[IU]/mL — ABNORMAL HIGH (ref 0.308–3.960)

## 2020-05-09 MED ORDER — SODIUM CHLORIDE 0.9% FLUSH
10.0000 mL | Freq: Once | INTRAVENOUS | Status: AC
  Administered 2020-05-09: 10 mL
  Filled 2020-05-09: qty 10

## 2020-05-09 NOTE — Telephone Encounter (Signed)
Discussed with [name]. Echo ordered and scheduled tomorrow at 0920. They will leave from echo and go to [facility] for office visit. Lisa [name] was grateful for assistance and agrees with plan.

## 2020-05-09 NOTE — Addendum Note (Signed)
Addended by: Harland German A on: 05/09/2020 01:41 PM   Modules accepted: Orders

## 2020-05-09 NOTE — Progress Notes (Signed)
Cardiology Clinic Note   Patient Name: Lisa [name] Date of Encounter: 05/10/2020  Primary Care Provider:  [name], MD Primary Cardiologist:  Sherren Mocha, MD  Patient Profile    Lisa [name] 87 year old female presents the clinic today for evaluation of her lower extremity edema.  Past Medical History    Past Medical History:  Diagnosis Date  . Diverticulosis   . Headache   . Hemorrhoids    internal and external  . Hyperlipidemia 01/29/1991  . Hypertension 10/29/1995  . Hypothyroidism 10/29/1995  . Lung mass    right middle lobe  . Melanoma ([place])    h/o, local excision. no chemo or rady.   . Skin cancer (melanoma) ([place])   . Tubular adenoma of colon   . Wears dentures    Past Surgical History:  Procedure Laterality Date  . BRONCHIAL BIOPSY  11/23/2017   Procedure: BRONCHIAL BIOPSIES;  Surgeon: Grace Isaac, MD;  Location: [facility] OR;  Service: Thoracic;;  . cataract surgery  2007, 2008   repair bilat  . DG BARIUM ENEMA ([place] HX) N/A 2016   [name]  . HERNIA REPAIR  04/25/2001  . HIP ARTHROPLASTY Left 05/31/2019   Procedure: LEFT HIP HEMIARTHROPLASTY;  Surgeon: Hiram Gash, MD;  Location: [place];  Service: Orthopedics;  Laterality: Left;  . IR IMAGING GUIDED PORT INSERTION  07/05/2018  . IR US GUIDE BX ASP/DRAIN  11/17/2017  . KNEE SURGERY     meniscus tear per Dr. Ronnie Derby, R knee  . MULTIPLE TOOTH EXTRACTIONS    . SEPTOPLASTY  2006   Dr. Ernesto Rutherford  . SKIN CANCER EXCISION    . TONSILLECTOMY  1954  . VAGINAL HYSTERECTOMY    . VIDEO BRONCHOSCOPY WITH ENDOBRONCHIAL ULTRASOUND N/A 11/23/2017   Procedure: VIDEO BRONCHOSCOPY WITH ENDOBRONCHIAL ULTRASOUND;  Surgeon: Grace Isaac, MD;  Location: [facility] OR;  Service: Thoracic;  Laterality: N/A;    Allergies  Allergies  Allergen Reactions  . Atorvastatin Other (See Comments)    CAUSED BURNING AND TINGLING IN LEGS  . Celebrex [Celecoxib] Swelling and Rash    SWELLING REACTION UNSPECIFIED   .  Cholecalciferol Anxiety and Other (See Comments)    Nervous, crying with high dose replacement  . Simvastatin Other (See Comments)    Muscle pain  . Tape Other (See Comments)    SKIN IS SENSITIVE!!    History of Present Illness    Lisa [name] has a PMH of hypothyroidism, hypertension, hyperlipidemia, and chest pain.  Her nuclear stress test 2014 showed normal LV function and no ischemia.  She was seen in the clinic by Dr. Burt Knack on 03/04/2015.  During that time she did not have cardiac complaints.  She reported a recent UTI that caused her to have a poor appetite and increased urinary frequency.  She denied palpitations, chest pain, shortness of breath, orthopnea, PND, lower extremity edema, dizziness, and syncope.  She was seen by Dr. Pia Mau 8/19 for a new right lung lesion and dilated ascending aorta.  She was noted to have had a slow decline in her overall health with weight loss, cough, and right-sided chest pain.  Chest x-ray showed right middle lobe lung lesion.  She reported a prior smoking history.  She also had prior melanoma resection of her right shoulder and a squamous cell carcinoma removed from her right chest and lower cath in 2012.  She was seen by Kathrynn Humble, NP-C 11/01/2017.  During that time she denied chest pain.  She did report some shortness of breath which was worse with increased activity.  She had a congested cough.  She denied dizziness or lightheadedness.  She reported progressive weakness.  She felt that she would not be able to walk 1 block, one flight of stairs she felt she may be able to do that would be difficult.  And she had not taken her medications on a day.  Her granddaughter contacted the nurse triage line on 05/09/2020 and reported that her grandmother had a steady weight gain over the last several weeks.  She had presented to her oncologist office who instructed her to report to her cardiologist.  A chest x-ray was ordered at that time.  An echocardiogram was  ordered and scheduled for 9:20 AM on 05/10/2020.  She presents the clinic today with her granddaughter for evaluation and states she has noticed steady weight increase over the last 2 months.  She has been receiving radiation and chemotherapy for non-small cell carcinoma of her right lung/stage IV lung cancer.  She had an echocardiogram performed this morning that has not resulted yet.  She also reports that she has been working with Dr. Julien Nordmann and are planning a right thoracentesis to alleviate moderate right pleural effusion.  I will prescribe 40 mg of furosemide x4 days and 40 mEq of potassium x4 days.  We will repeat a BMP in a week and have her follow-up in a week.  I will give him a salty 6 diet sheet, and have her follow a fluid restriction of 60--80 ounces daily.     Home Medications    Prior to Admission medications   Medication Sig Start Date End Date Taking? Authorizing Provider  albuterol (VENTOLIN HFA) 108 (90 Base) MCG/ACT inhaler Inhale 2 puffs into the lungs every 6 (six) hours as needed for wheezing or shortness of breath. 04/08/20   Heilingoetter, Cassandra L, PA-C  amLODipine (NORVASC) 10 MG tablet Take 1 tablet by mouth once daily 11/06/19   [name], MD  Cholecalciferol (VITAMIN D3) 50 MCG (2000 UT) TABS Take 2,000 Units by mouth 3 (three) times a week.    [provider]  Coenzyme Q10 100 MG capsule Take 100 mg by mouth daily.     [provider]  docusate sodium (COLACE) 100 MG capsule Take 100 mg by mouth daily as needed for mild constipation.    [provider]  fluticasone (FLONASE) 50 MCG/ACT nasal spray USE 2 SPRAYS IN EACH NOSTRIL DAILY AS NEEDED FOR ALLERGIES 08/02/18   [name], MD  levothyroxine (SYNTHROID) 75 MCG tablet Take 1 tablet by mouth once daily 03/20/20   [name], MD  loratadine (CLARITIN) 10 MG tablet Take 1 tablet (10 mg total) by mouth daily as needed for allergies or rhinitis. 08/02/18   [name], MD   mirtazapine (REMERON) 7.5 MG tablet TAKE 1 TABLET BY MOUTH ONCE DAILY AT BEDTIME 04/08/20   Heilingoetter, Cassandra L, PA-C  Multiple Vitamin (MULTIVITAMIN WITH MINERALS) TABS tablet Take 1 tablet by mouth daily. 06/03/19   Isaac Bliss, Rayford Halsted, MD  sennosides-docusate sodium (SENOKOT-S) 8.6-50 MG tablet Take 1 tablet by mouth daily as needed for constipation.    [provider]    Family History    Family History  Problem Relation Age of Onset  . Stroke Mother   . Heart disease Mother        CHF  . Stomach cancer Sister   . Cancer Brother  Metastatic cancer  . Colon cancer Neg Hx   . Breast cancer Neg Hx    She indicated that her mother is deceased. She indicated that her father is deceased. She indicated that both of her sisters are deceased. She indicated that her brother is deceased. She indicated that the status of her neg hx is unknown.  Social History    Social History   Socioeconomic History  . Marital status: Married    Spouse name: Not on file  . Number of children: 2  . Years of education: Not on file  . Highest education level: Not on file  Occupational History  . Occupation: Retired    Fish farm manager: RETIRED    Comment: Technical brewer  Tobacco Use  . Smoking status: Former Smoker    Packs/day: 1.00    Years: 30.00    Pack years: 30.00    Types: Cigarettes    Start date: 10/28/1958    Quit date: 10/27/1988    Years since quitting: 31.5  . Smokeless tobacco: Never Used  . Tobacco comment: quit 1994  Vaping Use  . Vaping Use: Never used  Substance and Sexual Activity  . Alcohol use: No  . Drug use: No  . Sexual activity: Never  Other Topics Concern  . Not on file  Social History Narrative   Married 1957 lives with husband   Social Determinants of Health   Financial Resource Strain: Not on file  Food Insecurity: Not on file  Transportation Needs: Not on file  Physical Activity: Not on file  Stress: Not on file  Social  Connections: Not on file  Intimate Partner Violence: Not on file     Review of Systems    General:  No chills, fever, night sweats or weight changes.  Cardiovascular:  No chest pain, dyspnea on exertion, edema, orthopnea, palpitations, paroxysmal nocturnal dyspnea. Dermatological: No rash, lesions/masses Respiratory: No cough, dyspnea Urologic: No hematuria, dysuria Abdominal:   No nausea, vomiting, diarrhea, bright red blood per rectum, melena, or hematemesis Neurologic:  No visual changes, wkns, changes in mental status. All other systems reviewed and are otherwise negative except as noted above.  Physical Exam    VS:  BP (!) 142/76   Pulse 68   Ht 5\' 7"  (1.702 m)   Wt 163 lb (73.9 kg)   SpO2 92%   BMI 25.53 kg/m  , BMI Body mass index is 25.53 kg/m. GEN: Well nourished, well developed, in no acute distress. HEENT: normal. Neck: Supple, no JVD, carotid bruits, or masses. Cardiac: RRR, no murmurs, rubs, or gallops. No clubbing, cyanosis, edema.  Radials/DP/PT 2+ and equal bilaterally.  Respiratory: Shallow respirations regular and unlabored, absent lung sounds right lower lobe diminished throughout.  GI: Soft, nontender, nondistended, BS + x 4. MS: no deformity or atrophy. Skin: warm and dry, no rash. Neuro:  Strength and sensation are intact. Psych: Normal affect.  Accessory Clinical Findings    Recent Labs: 05/09/2020: ALT 11; BUN 27; Creatinine 1.33; Hemoglobin 8.5; Platelet Count 132; Potassium 3.5; Sodium 143; TSH 6.846   Recent Lipid Panel    Component Value Date/Time   CHOL 234 (H) 06/15/2017 1002   TRIG 143.0 06/15/2017 1002   HDL 47.10 06/15/2017 1002   CHOLHDL 5 06/15/2017 1002   VLDL 28.6 06/15/2017 1002   LDLCALC 158 (H) 06/15/2017 1002   LDLDIRECT 156.0 06/05/2015 1137    ECG personally reviewed by me today-normal sinus rhythm left anterior fascicular block minimal voltage criteria for  LVH septal infarct undetermined age 20 bpm no ST or T wave  deviation.  Chest CT 7/19  1. Right middle lobe central thick walled cavitary lung mass is identified. In the absence of signs or symptoms of pneumonia findings are worrisome for a necrotic tumor. Associated postobstructive pneumonitis of the right middle lobe is noted. Recommend further evaluation with PET-CT and tissue sampling. 2. Ascending thoracic aortic aneurysm. Ascending thoracic aortic aneurysm. Recommend semi-annual imaging followup by CTA or MRA and referral to cardiothoracic surgery if not already obtained. This recommendation follows 2010 ACCF/AHA/AATS/ACR/ASA/SCA/SCAI/SIR/STS/SVM Guidelines for the Diagnosis and Management of Patients With Thoracic Aortic Disease. Circulation. 2010; 121: J570-V779. Aortic aneurysm NOS (ICD10-I71.9). Aortic Atherosclerosis (ICD10-I70.0).    Echocardiogram 10/29/2017  Study Conclusions   - Left ventricle: Global longitudinal strain is -20.4% Mild inferor  and inferoseptal hypokinesis. The cavity size was normal. Wall  thickness was increased in a pattern of mild LVH. Systolic  function was normal. The estimated ejection fraction was in the  range of 50% to 55%.  - Aortic valve: There was mild regurgitation.  - Aorta: Aortc root 35 mm Sinotubular junction is 31 mm. Ascending  aorta is dilated at 47 mm.  - Mitral valve: There was mild regurgitation.  - Pulmonary arteries: PA peak pressure: 33 mm Hg (S).   Assessment & Plan   1.  Bilateral lower extremity edema-+2 pitting bilateral lower extremity edema up to mid shin.  Weight today 163 Furosemide 40 mg x 4 days  20 mEq x 3 days  Heart healthy low-sodium diet-salty 6 given Increase physical activity as tolerated Daily weights-contact office with weight increase of 3 pounds overnight or 5 pounds in 1 week Order BMP in 1 week  Dilated ascending aorta-denies back and chest pain.  Previously measured at 47 mm on 8/19.  Await results of echocardiogram today. Continue  amlodipine Heart healthy low-sodium diet-salty 6 given Increase physical activity as tolerated  Essential hypertension-BP today 142/76.   Continue amlodipine Heart healthy low-sodium diet-salty 6 given Increase physical activity as tolerated  Hyperlipidemia-LDL 158 on 06/15/2017.  Statin intolerant Continue coq.10 Heart healthy low-sodium high-fiber diet Increase physical activity as tolerated  CKD stage IV-creatinine 1.33 on 05/09/2020.  Stable Follows with PCP  Disposition: Follow-up with me in 1 week.   Jossie Ng. Braxton Vantrease NP-C    05/10/2020, 12:14 PM [address] Office [phone] Fax [phone]  Notice: This dictation was prepared with Dragon dictation along with smaller phrase technology. Any transcriptional errors that result from this process are unintentional and may not be corrected upon review.  I spent 15 minutes examining this patient, reviewing medications, and using patient centered shared decision making involving her cardiac care.  Prior to her visit I spent greater than 20 minutes reviewing her past medical history,  medications, and prior cardiac tests.

## 2020-05-09 NOTE — Telephone Encounter (Signed)
The patient's granddaughter, Lisa [name], reports steady weight gain over the last several weeks.  The patient was seen at Dr. Worthy Flank office today and was instructed to call Cardiology for an appointment. A CXR was ordered.  The patient was last evaluated by Lisa [name] in 2019.  Due to availability, scheduled Lisa [name] for evaluation with Lisa [name] at the [place] office tomorrow for evaluation. Lisa [name] was grateful for call and agrees with plan.

## 2020-05-09 NOTE — Progress Notes (Signed)
[place] [phone]   Fax:([phone]  OFFICE PROGRESS NOTE  [name], MD [place] [address]  DIAGNOSIS: Recurrent non-small cell lung cancer initially diagnosed stage IIIB (T3, N3, M0) non-small cell lung cancer, squamous cell carcinoma presented with large right middle lobe lung mass in addition to right hilar, subcarinal and right supraclavicular lymphadenopathy diagnosed in August 2019.  The patient had evidence for disease progression in December 2020.  PRIOR THERAPY:  1) Concurrent chemoradiation with weekly carboplatin for AUC of 2 and paclitaxel 45 NG/M2.  Status post 6 cycles with partial response. 2) Consolidation immunotherapy with Imfinzi 10 mg/KG every 2 weeks started February 28, 2018.  Status post 26 cycles.  Last cycle was given on February 21, 2019  CURRENT THERAPY: Systemic chemotherapy with carboplatin for AUC of 5, paclitaxel 175 mg/M2 and Keytruda 200 mg IV every 3 weeks.  First dose April 03, 2018.  Starting from cycle #5 the patient is on maintenance treatment with single agent Keytruda.  Status post 18 cycles.  INTERVAL HISTORY: Lisa [name] 87 y.o. female returns to the clinic today for follow-up visit accompanied by her granddaughter [name].  The patient is complaining of increasing fatigue and weakness as well as labored breathing and sleeping most of the time.  She also has a swelling of the face and lower extremities.  She had a history of congestive heart failure and she was followed by Dr. Harold Hedge but she has not been on any treatment for her heart for several years since her lung cancer diagnosis.  She denied having any current chest pain but has the shortness of breath and mild cough with no hemoptysis.  She has no nausea, vomiting, diarrhea or constipation.  She has no headache or visual changes.  She has no fever or chills.  She was here today for evaluation before starting cycle  #19.  MEDICAL HISTORY: Past Medical History:  Diagnosis Date  . Diverticulosis   . Headache   . Hemorrhoids    internal and external  . Hyperlipidemia 01/29/1991  . Hypertension 10/29/1995  . Hypothyroidism 10/29/1995  . Lung mass    right middle lobe  . Melanoma ([place])    h/o, local excision. no chemo or rady.   . Skin cancer (melanoma) ([place])   . Tubular adenoma of colon   . Wears dentures     ALLERGIES:  is allergic to atorvastatin, celebrex [celecoxib], cholecalciferol, simvastatin, and tape.  MEDICATIONS:  Current Outpatient Medications  Medication Sig Dispense Refill  . albuterol (VENTOLIN HFA) 108 (90 Base) MCG/ACT inhaler Inhale 2 puffs into the lungs every 6 (six) hours as needed for wheezing or shortness of breath. 6.7 g 0  . amLODipine (NORVASC) 10 MG tablet Take 1 tablet by mouth once daily 90 tablet 0  . Cholecalciferol (VITAMIN D3) 50 MCG (2000 UT) TABS Take 2,000 Units by mouth 3 (three) times a week.    . Coenzyme Q10 100 MG capsule Take 100 mg by mouth daily.     Marland Kitchen docusate sodium (COLACE) 100 MG capsule Take 100 mg by mouth daily as needed for mild constipation.    . fluticasone (FLONASE) 50 MCG/ACT nasal spray USE 2 SPRAYS IN EACH NOSTRIL DAILY AS NEEDED FOR ALLERGIES 48 g 3  . levothyroxine (SYNTHROID) 75 MCG tablet Take 1 tablet by mouth once daily 90 tablet 1  . loratadine (CLARITIN) 10 MG tablet Take 1 tablet (10 mg total)  by mouth daily as needed for allergies or rhinitis.    . mirtazapine (REMERON) 7.5 MG tablet TAKE 1 TABLET BY MOUTH ONCE DAILY AT BEDTIME 30 tablet 0  . Multiple Vitamin (MULTIVITAMIN WITH MINERALS) TABS tablet Take 1 tablet by mouth daily.    . sennosides-docusate sodium (SENOKOT-S) 8.6-50 MG tablet Take 1 tablet by mouth daily as needed for constipation.     No current facility-administered medications for this visit.   Facility-Administered Medications Ordered in Other Visits  Medication Dose Route Frequency Provider Last Rate Last  Admin  . acetaminophen (TYLENOL) tablet 650 mg  650 mg Oral Once Nicholas Lose, MD        SURGICAL HISTORY:  Past Surgical History:  Procedure Laterality Date  . BRONCHIAL BIOPSY  11/23/2017   Procedure: BRONCHIAL BIOPSIES;  Surgeon: Grace Isaac, MD;  Location: [facility] OR;  Service: Thoracic;;  . cataract surgery  2007, 2008   repair bilat  . DG BARIUM ENEMA ([place] HX) N/A 2016   [name]  . HERNIA REPAIR  04/25/2001  . HIP ARTHROPLASTY Left 05/31/2019   Procedure: LEFT HIP HEMIARTHROPLASTY;  Surgeon: Hiram Gash, MD;  Location: [place];  Service: Orthopedics;  Laterality: Left;  . IR IMAGING GUIDED PORT INSERTION  07/05/2018  . IR US GUIDE BX ASP/DRAIN  11/17/2017  . KNEE SURGERY     meniscus tear per Dr. Ronnie Derby, R knee  . MULTIPLE TOOTH EXTRACTIONS    . SEPTOPLASTY  2006   Dr. Ernesto Rutherford  . SKIN CANCER EXCISION    . TONSILLECTOMY  1954  . VAGINAL HYSTERECTOMY    . VIDEO BRONCHOSCOPY WITH ENDOBRONCHIAL ULTRASOUND N/A 11/23/2017   Procedure: VIDEO BRONCHOSCOPY WITH ENDOBRONCHIAL ULTRASOUND;  Surgeon: Grace Isaac, MD;  Location: [place];  Service: Thoracic;  Laterality: N/A;    REVIEW OF SYSTEMS:  Constitutional: positive for anorexia and fatigue Eyes: negative Ears, nose, mouth, throat, and face: negative Respiratory: positive for dyspnea on exertion Cardiovascular: negative Gastrointestinal: negative Genitourinary:negative Integument/breast: negative Hematologic/lymphatic: negative Musculoskeletal:positive for muscle weakness Neurological: negative Behavioral/Psych: negative Endocrine: negative Allergic/Immunologic: negative   PHYSICAL EXAMINATION: General appearance: alert, cooperative, fatigued and no distress Head: Normocephalic, without obvious abnormality, atraumatic Neck: no adenopathy, no JVD, supple, symmetrical, trachea midline and thyroid not enlarged, symmetric, no tenderness/mass/nodules Lymph nodes: Cervical, supraclavicular, and axillary nodes  normal. Resp: diminished breath sounds RLL and dullness to percussion RLL Back: symmetric, no curvature. ROM normal. No CVA tenderness. Cardio: regular rate and rhythm, S1, S2 normal, no murmur, click, rub or gallop GI: soft, non-tender; bowel sounds normal; no masses,  no organomegaly Extremities: extremities normal, atraumatic, no cyanosis or edema Neurologic: Alert and oriented X 3, normal strength and tone. Normal symmetric reflexes. Normal coordination and gait  ECOG PERFORMANCE STATUS: 1 - Symptomatic but completely ambulatory  Blood pressure (!) 146/82, pulse 69, temperature (!) 97.4 F (36.3 C), temperature source Oral, resp. rate 18, height 5\' 7"  (1.702 m), weight 166 lb 1.6 oz (75.3 kg), SpO2 95 %.  LABORATORY DATA: Lab Results  Component Value Date   WBC 6.4 05/09/2020   HGB 8.5 (L) 05/09/2020   HCT 28.7 (L) 05/09/2020   MCV 92.6 05/09/2020   PLT 132 (L) 05/09/2020      Chemistry      Component Value Date/Time   NA 141 04/18/2020 1135   K 3.8 04/18/2020 1135   CL 105 04/18/2020 1135   CO2 28 04/18/2020 1135   BUN 23 04/18/2020 1135   CREATININE 1.43 (H)  04/18/2020 1135      Component Value Date/Time   CALCIUM 8.8 (L) 04/18/2020 1135   ALKPHOS 75 04/18/2020 1135   AST 15 04/18/2020 1135   ALT 10 04/18/2020 1135   BILITOT 0.4 04/18/2020 1135       RADIOGRAPHIC STUDIES: No results found.  ASSESSMENT AND PLAN: This is a very pleasant 87 years old white female with stage IIIb non-small cell lung cancer, squamous cell carcinoma.  She is currently undergoing a course of concurrent chemoradiation with weekly carboplatin and paclitaxel status post 6 cycles.  She tolerated this treatment well with partial response. The patient also completed the course of consolidation treatment with immunotherapy with Imfinzi status post 26 cycles.  She tolerated her previous treatment well. Her recent CT scan of the chest showed progressive consolidation in the right lung suspicious  for disease recurrence but also worsening post radiation effect could not be excluded.  A PET scan showed rounded hypermetabolic soft tissue mass inferior to the right hilum consistent with lung cancer recurrence.  There was also hypermetabolic precarinal lymph node concerning for nodal metastasis but no evidence for metastatic disease in the abdomen or pelvis.  The patient also had groundglass density in the right upper lobe, right middle lobe as well as left lower lobe suspicious for inflammatory process and less likely malignancy. The patient started treatment with systemic chemotherapy with carboplatin for AUC of 5, paclitaxel 175 mg/M2 and Keytruda 200 mg IV every 3 weeks with Neulasta support status post 18 cycles.  Starting from cycle #5 the patient is currently on maintenance treatment with single agent Keytruda every 3 weeks.   The patient continues to tolerate her treatment with [facility] fairly well.  She presented today for evaluation before starting cycle #19 but she has been complaining of increasing fatigue and weakness as well as puffiness of her face and swelling of the lower extremities concerning for worsening of her congestive heart failure. She also has a history of right pleural effusion. I will check a chest x-ray today for evaluation of the pleural effusion and if enlarged, will consider the patient for ultrasound-guided thoracentesis. For the history of congestive heart failure she was advised to see Dr. Harold Hedge for evaluation and management of her congestive heart failure. I will hold her treatment with Keytruda for now until improvement of her condition. For the anemia of chronic disease, her hemoglobin is 8.5 today.  We'll continue to monitor and consider the patient for transfusion if needed. She will come back for follow-up visit in 3 weeks for evaluation before the next cycle of her treatment. The patient voices understanding of current disease status and treatment options and is  in agreement with the current care plan. All questions were answered. The patient knows to call the clinic with any problems, questions or concerns. We can certainly see the patient much sooner if necessary.  Disclaimer: This note was dictated with voice recognition software. Similar sounding words can inadvertently be transcribed and may not be corrected upon review.

## 2020-05-10 ENCOUNTER — Encounter: Payer: Self-pay | Admitting: General Practice

## 2020-05-10 ENCOUNTER — Ambulatory Visit: Payer: Medicare Other | Admitting: General Practice

## 2020-05-10 ENCOUNTER — Telehealth: Payer: Self-pay | Admitting: General Practice

## 2020-05-10 ENCOUNTER — Other Ambulatory Visit: Payer: Self-pay | Admitting: Physician Assistant

## 2020-05-10 ENCOUNTER — Ambulatory Visit (HOSPITAL_COMMUNITY): Payer: Medicare Other | Attending: Cardiology

## 2020-05-10 VITALS — BP 142/76 | HR 68 | Ht 67.0 in | Wt 163.0 lb

## 2020-05-10 DIAGNOSIS — I77810 Thoracic aortic ectasia: Secondary | ICD-10-CM

## 2020-05-10 DIAGNOSIS — E039 Hypothyroidism, unspecified: Secondary | ICD-10-CM | POA: Insufficient documentation

## 2020-05-10 DIAGNOSIS — I313 Pericardial effusion (noninflammatory): Secondary | ICD-10-CM | POA: Diagnosis not present

## 2020-05-10 DIAGNOSIS — E782 Mixed hyperlipidemia: Secondary | ICD-10-CM

## 2020-05-10 DIAGNOSIS — C3491 Malignant neoplasm of unspecified part of right bronchus or lung: Secondary | ICD-10-CM

## 2020-05-10 DIAGNOSIS — I10 Essential (primary) hypertension: Secondary | ICD-10-CM

## 2020-05-10 DIAGNOSIS — I340 Nonrheumatic mitral (valve) insufficiency: Secondary | ICD-10-CM | POA: Insufficient documentation

## 2020-05-10 DIAGNOSIS — R609 Edema, unspecified: Secondary | ICD-10-CM | POA: Diagnosis not present

## 2020-05-10 DIAGNOSIS — Z85118 Personal history of other malignant neoplasm of bronchus and lung: Secondary | ICD-10-CM | POA: Diagnosis not present

## 2020-05-10 DIAGNOSIS — Z9221 Personal history of antineoplastic chemotherapy: Secondary | ICD-10-CM | POA: Insufficient documentation

## 2020-05-10 DIAGNOSIS — J90 Pleural effusion, not elsewhere classified: Secondary | ICD-10-CM | POA: Insufficient documentation

## 2020-05-10 DIAGNOSIS — E785 Hyperlipidemia, unspecified: Secondary | ICD-10-CM | POA: Diagnosis not present

## 2020-05-10 DIAGNOSIS — R600 Localized edema: Secondary | ICD-10-CM

## 2020-05-10 DIAGNOSIS — R630 Anorexia: Secondary | ICD-10-CM

## 2020-05-10 DIAGNOSIS — N184 Chronic kidney disease, stage 4 (severe): Secondary | ICD-10-CM

## 2020-05-10 LAB — ECHOCARDIOGRAM COMPLETE
Area-P 1/2: 4.49 cm2
MV M vel: 3.72 m/s
MV Peak grad: 55.4 mmHg
P 1/2 time: 702 msec
S' Lateral: 4.8 cm

## 2020-05-10 MED ORDER — POTASSIUM CHLORIDE CRYS ER 20 MEQ PO TBCR: 20.0000 meq | EXTENDED_RELEASE_TABLET | Freq: Every day | ORAL | 0 refills | Status: DC

## 2020-05-10 MED ORDER — FUROSEMIDE 40 MG PO TABS: 40.0000 mg | ORAL_TABLET | Freq: Every day | ORAL | 0 refills | Status: DC

## 2020-05-10 NOTE — Telephone Encounter (Signed)
Left detailed message about echocardiogram, reduction in ejection fraction, and changes noted in the report..  Plan to continue same medication regimen at this time.  Instructed to call back with questions.  Jossie Ng. Angad Nabers NP-C    05/10/2020, 1:28 PM [facility] [address] Office [phone] Fax [phone]

## 2020-05-10 NOTE — Patient Instructions (Signed)
Medication Instructions:  TAKE LASIX 40MG  x 4 DAYS THEN STOP  TAKE POTASSIUM 20 MEQ x 4DAYS THEN STOP *If you need a refill on your cardiac medications before your next appointment, please call your pharmacy*  Lab Work:   Testing/Procedures:  NONE    NONE  Special Instructions PLEASE PURCHASE AND WEAR COMPRESSION STOCKINGS DAILY AND TAKE OFF AT BEDTIME. Compression stockings are elastic socks that squeeze the legs. They help to increase blood flow to the legs and to decrease swelling in the legs from fluid retention, and reduce the chance of developing blood clots in the lower legs. Please put on in the AM when dressing and off at night when dressing for bed.  LET THEM KNOW THAT YOU NEED KNEE HIGH'S WITH COMPRESSION OF 15-20 mmhg. [employer]  [employer] ([place] [phone]); [place],  [address]; [phone]  EMAIL   [email or web address].  PLEASE MAKE SURE TO ELEVATE YOUR FEET & LEGS WHILE SITTING, THIS WILL HELP WITH THE SWELLING ALSO.  FLUID RESTRICTION 60-80 OUNCES DAILY   PLEASE READ AND FOLLOW SALTY 6-ATTACHED-1,800mg  daily  Follow-Up: Your next appointment:  1 week(s) In Person with [place], FNP-C   At [facility], you and your health needs are our priority.  As part of our continuing mission to provide you with exceptional heart care, we have created designated Provider Care Teams.  These Care Teams include your primary Cardiologist (physician) and Advanced Practice Providers (APPs -  Physician Assistants and Nurse Practitioners) who all work together to provide you with the care you need, when you need it.            6 SALTY THINGS TO AVOID     1,800MG  DAILY

## 2020-05-13 ENCOUNTER — Ambulatory Visit (HOSPITAL_COMMUNITY): Admission: RE | Admit: 2020-05-13 | Payer: Medicare Other | Source: Ambulatory Visit

## 2020-05-13 ENCOUNTER — Ambulatory Visit: Payer: Medicare Other | Admitting: Cardiology

## 2020-05-13 ENCOUNTER — Telehealth: Payer: Self-pay | Admitting: Internal Medicine

## 2020-05-13 NOTE — Telephone Encounter (Signed)
Per 2/10 los, next appts already scheduled

## 2020-05-14 ENCOUNTER — Other Ambulatory Visit (HOSPITAL_COMMUNITY)
Admission: RE | Admit: 2020-05-14 | Discharge: 2020-05-14 | Disposition: A | Discharge status: Home / Self Care | Payer: Medicare Other | Source: Ambulatory Visit | Attending: General Practice | Admitting: General Practice

## 2020-05-14 ENCOUNTER — Telehealth: Payer: Self-pay | Admitting: General Practice

## 2020-05-14 DIAGNOSIS — R600 Localized edema: Secondary | ICD-10-CM

## 2020-05-14 DIAGNOSIS — U071 COVID-19: Secondary | ICD-10-CM | POA: Diagnosis not present

## 2020-05-14 DIAGNOSIS — Z01812 Encounter for preprocedural laboratory examination: Secondary | ICD-10-CM | POA: Diagnosis not present

## 2020-05-14 MED ORDER — METOLAZONE 2.5 MG PO TABS: 2.5000 mg | ORAL_TABLET | Freq: Every day | ORAL | 0 refills | Status: DC

## 2020-05-14 MED ORDER — POTASSIUM CHLORIDE CRYS ER 20 MEQ PO TBCR: 20.0000 meq | EXTENDED_RELEASE_TABLET | Freq: Every day | ORAL | 0 refills | Status: DC

## 2020-05-14 MED ORDER — FUROSEMIDE 80 MG PO TABS: 80.0000 mg | ORAL_TABLET | Freq: Every day | ORAL | 0 refills | Status: DC

## 2020-05-14 NOTE — Telephone Encounter (Signed)
I contacted Lisa [name]'s granddaughter Lisa [name] at 9:18 AM.  The patient's Lasix did not diurese her well.  We will increase Lasix to 80 mg x 4 days, continue potassium 20 mEq x 4 days.  I will also add metolazone 2.5 mg to be taken 30 minutes prior to furosemide today and 30 minutes prior to furosemide on 05/16/2020.  We will keep follow-up appointment.  She expressed understanding.

## 2020-05-15 ENCOUNTER — Telehealth: Payer: Self-pay | Admitting: General Practice

## 2020-05-15 LAB — SARS CORONAVIRUS 2 (TAT 6-24 HRS): SARS Coronavirus 2: POSITIVE — AB

## 2020-05-15 NOTE — Telephone Encounter (Signed)
Patient's daughter [name] called back. Positive COVID-19 result was discussed. She reports that her furosemide and Zaroxolyn medications were picked up from pharmacy yesterday. She reported that her grandmother is usually sleeping's time and she has not yet checked in with her this morning to see how she was doing. We discussed hospital admission if patient's symptoms have not improved and moving office appointment to later than 05/19/2020. She expressed understanding. She will call back to schedule appointment at a later time after checking her her family/grandma.

## 2020-05-15 NOTE — Telephone Encounter (Signed)
Left message with granddaughter Luiz Iron to call back.  COVID-19 result positive.  Will need to change to virtual visit or reschedule office visit after 05/19/2020.

## 2020-05-16 ENCOUNTER — Telehealth: Payer: Self-pay

## 2020-05-16 ENCOUNTER — Encounter (HOSPITAL_COMMUNITY): Payer: Self-pay

## 2020-05-16 ENCOUNTER — Ambulatory Visit (HOSPITAL_COMMUNITY): Admission: RE | Admit: 2020-05-16 | Payer: Medicare Other | Source: Ambulatory Visit

## 2020-05-16 NOTE — Telephone Encounter (Signed)
Called to discuss with patient about COVID-19 symptoms and the use of one of the available treatments for those with mild to moderate Covid symptoms and at a high risk of hospitalization.  Pt appears to qualify for outpatient treatment due to co-morbid conditions and/or a member of an at-risk group in accordance with the FDA Emergency Use Authorization.    Symptom onset: Unsure . Pt. Has cough, shortness of breath Vaccinated: No Booster? No Immunocompromised? Yes Qualifiers: Lung cancer  Declines any further treatment.   Lisa [name]

## 2020-05-16 NOTE — Telephone Encounter (Signed)
Called daughter and rescheduled appointment

## 2020-05-17 ENCOUNTER — Ambulatory Visit: Payer: Medicare Other | Admitting: General Practice

## 2020-05-19 NOTE — Progress Notes (Signed)
Cardiology Clinic Note   Patient Name: Lisa [name] Date of Encounter: 05/20/2020  Primary Care Provider:  [name], MD Primary Cardiologist:  Sherren Mocha, MD  Patient Profile    Tula Nakayama. Heiney 87 year old female presents the clinic today for follow-up evaluation of her lower extremity edema.  Past Medical History    Past Medical History:  Diagnosis Date  . Diverticulosis   . Headache   . Hemorrhoids    internal and external  . Hyperlipidemia 01/29/1991  . Hypertension 10/29/1995  . Hypothyroidism 10/29/1995  . Lung mass    right middle lobe  . Melanoma ([place])    h/o, local excision. no chemo or rady.   . Skin cancer (melanoma) ([place])   . Tubular adenoma of colon   . Wears dentures    Past Surgical History:  Procedure Laterality Date  . BRONCHIAL BIOPSY  11/23/2017   Procedure: BRONCHIAL BIOPSIES;  Surgeon: Grace Isaac, MD;  Location: [facility] OR;  Service: Thoracic;;  . cataract surgery  2007, 2008   repair bilat  . DG BARIUM ENEMA ([place] HX) N/A 2016   [name]  . HERNIA REPAIR  04/25/2001  . HIP ARTHROPLASTY Left 05/31/2019   Procedure: LEFT HIP HEMIARTHROPLASTY;  Surgeon: Hiram Gash, MD;  Location: [facility];  Service: Orthopedics;  Laterality: Left;  . IR IMAGING GUIDED PORT INSERTION  07/05/2018  . IR US GUIDE BX ASP/DRAIN  11/17/2017  . KNEE SURGERY     meniscus tear per Dr. Ronnie Derby, R knee  . MULTIPLE TOOTH EXTRACTIONS    . SEPTOPLASTY  2006   Dr. Ernesto Rutherford  . SKIN CANCER EXCISION    . TONSILLECTOMY  1954  . VAGINAL HYSTERECTOMY    . VIDEO BRONCHOSCOPY WITH ENDOBRONCHIAL ULTRASOUND N/A 11/23/2017   Procedure: VIDEO BRONCHOSCOPY WITH ENDOBRONCHIAL ULTRASOUND;  Surgeon: Grace Isaac, MD;  Location: [facility] OR;  Service: Thoracic;  Laterality: N/A;    Allergies  Allergies  Allergen Reactions  . Atorvastatin Other (See Comments)    CAUSED BURNING AND TINGLING IN LEGS  . Celebrex [Celecoxib] Swelling and Rash    SWELLING REACTION UNSPECIFIED    . Cholecalciferol Anxiety and Other (See Comments)    Nervous, crying with high dose replacement  . Simvastatin Other (See Comments)    Muscle pain  . Tape Other (See Comments)    SKIN IS SENSITIVE!!    History of Present Illness    Ms. Barkett has a PMH of hypothyroidism, hypertension, hyperlipidemia, and chest pain.  Her nuclear stress test 2014 showed normal LV function and no ischemia.  She was seen in the clinic by Dr. Burt Knack on 03/04/2015.  During that time she did not have cardiac complaints.  She reported a recent UTI that caused her to have a poor appetite and increased urinary frequency.  She denied palpitations, chest pain, shortness of breath, orthopnea, PND, lower extremity edema, dizziness, and syncope.  She was seen by Dr. Pia Mau 8/19 for a new right lung lesion and dilated ascending aorta.  She was noted to have had a slow decline in her overall health with weight loss, cough, and right-sided chest pain.  Chest x-ray showed right middle lobe lung lesion.  She reported a prior smoking history.  She also had prior melanoma resection of her right shoulder and a squamous cell carcinoma removed from her right chest and lower cath in 2012.  She was seen by Kathrynn Humble, NP-C 11/01/2017.  During that time she denied chest pain.  She did report some shortness of breath which was worse with increased activity.  She had a congested cough.  She denied dizziness or lightheadedness.  She reported progressive weakness.  She felt that she would not be able to walk 1 block, one flight of stairs she felt she may be able to do that would be difficult.  And she had not taken her medications on a day.  Her granddaughter contacted the nurse triage line on 05/09/2020 and reported that her grandmother had a steady weight gain over the last several weeks.  She had presented to her oncologist office who instructed her to report to her cardiologist.  A chest x-ray was ordered at that time.  An  echocardiogram was ordered and scheduled for 9:20 AM on 05/10/2020.  She presented the clinic 05/10/20 with her granddaughter for evaluation and stated she has noticed steady weight increase over the last 2 months.  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  She had an echocardiogram performed that morning that had not resulted yet.  She also reported that she had been working with Dr. Julien Nordmann and a  right thoracentesis was planned to alleviate moderate right pleural effusion.  I  prescribed 40 mg of furosemide x4 days and 40 mEq of potassium x4 days.  We planned to repeat a BMP in a week and have her follow-up in a week.  I gave him a salty 6 diet sheet, and had her follow a fluid restriction of 60--80 ounces daily.  Her granddaughter contacted the office after completion of her furosemide and reported that she had not responded well with diuresis.  Her furosemide was increased to 80 mg x 4 days with the addition of metolazone x2 days and potassium supplementation was continued.  She also contracted COVID-19 when her follow-up appointment was rescheduled to May 20, 2020.  She presents the clinic today for follow-up evaluation states she feels much better.  She has much more alert and responsive on exam today.  She had a 32 pound weight decrease.  Emilee her granddaughter reports she has not showed symptoms of COVID-19 infection.  She has been walking around at home.  I will order a BMP today, give salty 6 diet sheet, have family continue to monitor for lower extremity edema, and follow-up in 3 months.  Today she denies chest pain, shortness of breath, lower extremity edema, fatigue, palpitations, melena, hematuria, hemoptysis, diaphoresis, weakness, presyncope, syncope, orthopnea, and PND.   Home Medications    Prior to Admission medications   Medication Sig Start Date End Date Taking? Authorizing Provider  albuterol  (VENTOLIN HFA) 108 (90 Base) MCG/ACT inhaler Inhale 2 puffs into the lungs every 6 (six) hours as needed for wheezing or shortness of breath. 04/08/20   Heilingoetter, Cassandra L, PA-C  amLODipine (NORVASC) 10 MG tablet Take 1 tablet by mouth once daily 11/06/19   [name], MD  Cholecalciferol (VITAMIN D3) 50 MCG (2000 UT) TABS Take 2,000 Units by mouth 3 (three) times a week.    [provider]  Coenzyme Q10 100 MG capsule Take 100 mg by mouth daily.     [provider]  docusate sodium (COLACE) 100 MG capsule Take 100 mg by mouth daily as needed for mild constipation.    [provider]  fluticasone (FLONASE) 50 MCG/ACT nasal spray USE 2 SPRAYS IN EACH NOSTRIL DAILY AS NEEDED FOR ALLERGIES 08/02/18   [name],  MD  furosemide (LASIX) 80 MG tablet Take 1 tablet (80 mg total) by mouth daily. Take for 4 days 05/14/20 08/12/20  Deberah Pelton, NP  levothyroxine (SYNTHROID) 75 MCG tablet Take 1 tablet by mouth once daily 03/20/20   [name], MD  loratadine (CLARITIN) 10 MG tablet Take 1 tablet (10 mg total) by mouth daily as needed for allergies or rhinitis. 08/02/18   [name], MD  metolazone (ZAROXOLYN) 2.5 MG tablet Take 1 tablet (2.5 mg total) by mouth daily for 2 doses. Take 1 tablet today (05/14/20) 30 minutes before lasix and 1 tablet (05/16/20) 30 minutes before lasix 05/14/20 05/16/20  Beaux Wedemeyer, Jossie Ng, NP  mirtazapine (REMERON) 7.5 MG tablet TAKE 1 TABLET BY MOUTH ONCE DAILY AT BEDTIME 05/10/20   Heilingoetter, Cassandra L, PA-C  Multiple Vitamin (MULTIVITAMIN WITH MINERALS) TABS tablet Take 1 tablet by mouth daily. 06/03/19   Isaac Bliss, Rayford Halsted, MD  potassium chloride SA (KLOR-CON M20) 20 MEQ tablet Take 1 tablet (20 mEq total) by mouth daily. Take for 4 days 05/14/20 08/12/20  Deberah Pelton, NP  sennosides-docusate sodium (SENOKOT-S) 8.6-50 MG tablet Take 1 tablet by mouth daily as needed for constipation.    [provider]     Family History    Family History  Problem Relation Age of Onset  . Stroke Mother   . Heart disease Mother        CHF  . Stomach cancer Sister   . Cancer Brother        Metastatic cancer  . Colon cancer Neg Hx   . Breast cancer Neg Hx    She indicated that her mother is deceased. She indicated that her father is deceased. She indicated that both of her sisters are deceased. She indicated that her brother is deceased. She indicated that the status of her neg hx is unknown.  Social History    Social History   Socioeconomic History  . Marital status: Married    Spouse name: Not on file  . Number of children: 2  . Years of education: Not on file  . Highest education level: Not on file  Occupational History  . Occupation: Retired    Fish farm manager: RETIRED    Comment: Technical brewer  Tobacco Use  . Smoking status: Former Smoker    Packs/day: 1.00    Years: 30.00    Pack years: 30.00    Types: Cigarettes    Start date: 10/28/1958    Quit date: 10/27/1988    Years since quitting: 31.5  . Smokeless tobacco: Never Used  . Tobacco comment: quit 1994  Vaping Use  . Vaping Use: Never used  Substance and Sexual Activity  . Alcohol use: No  . Drug use: No  . Sexual activity: Never  Other Topics Concern  . Not on file  Social History Narrative   Married 1957 lives with husband   Social Determinants of Health   Financial Resource Strain: Not on file  Food Insecurity: Not on file  Transportation Needs: Not on file  Physical Activity: Not on file  Stress: Not on file  Social Connections: Not on file  Intimate Partner Violence: Not on file     Review of Systems    General:  No chills, fever, night sweats or weight changes.  Cardiovascular:  No chest pain, dyspnea on exertion, edema, orthopnea, palpitations, paroxysmal nocturnal dyspnea. Dermatological: No rash, lesions/masses Respiratory: No cough, dyspnea Urologic: No hematuria, dysuria Abdominal:  No  nausea, vomiting, diarrhea, bright red blood per rectum, melena, or hematemesis Neurologic:  No visual changes, wkns, changes in mental status. All other systems reviewed and are otherwise negative except as noted above.  Physical Exam    VS:  BP 105/71   Pulse 86   Ht 5\' 7"  (1.702 m)   Wt 131 lb (59.4 kg)   SpO2 95%   BMI 20.52 kg/m  , BMI Body mass index is 20.52 kg/m. GEN: Well nourished, well developed, in no acute distress. HEENT: normal. Neck: Supple, no JVD, carotid bruits, or masses. Cardiac: RRR, no murmurs, rubs, or gallops. No clubbing, cyanosis, generalized bilateral lower extremity nonpitting edema left greater than right.  Radials/DP/PT 2+ and equal bilaterally.  Respiratory:  Respirations regular and unlabored, clear to auscultation bilaterally. GI: Soft, nontender, nondistended, BS + x 4. MS: no deformity or atrophy. Skin: warm and dry, no rash. Neuro:  Strength and sensation are intact. Psych: Normal affect.  Accessory Clinical Findings    Recent Labs: 05/09/2020: ALT 11; BUN 27; Creatinine 1.33; Hemoglobin 8.5; Platelet Count 132; Potassium 3.5; Sodium 143; TSH 6.846   Recent Lipid Panel    Component Value Date/Time   CHOL 234 (H) 06/15/2017 1002   TRIG 143.0 06/15/2017 1002   HDL 47.10 06/15/2017 1002   CHOLHDL 5 06/15/2017 1002   VLDL 28.6 06/15/2017 1002   LDLCALC 158 (H) 06/15/2017 1002   LDLDIRECT 156.0 06/05/2015 1137    ECG personally reviewed by me today-none today.  Echocardiogram 05/10/2008 IMPRESSIONS    1. Left ventricular ejection fraction, by estimation, is 40 to 45%. The  left ventricle has mildly decreased function. The left ventricle  demonstrates global hypokinesis. The left ventricular internal cavity size  was moderately dilated. Left ventricular  diastolic parameters are consistent with Grade I diastolic dysfunction  (impaired relaxation). Elevated left ventricular end-diastolic pressure.  2. Right ventricular systolic  function is normal. The right ventricular  size is normal. There is moderately elevated pulmonary artery systolic  pressure. The estimated right ventricular systolic pressure is 57.8 mmHg.  3. The pericardial effusion is circumferential. Moderate pleural effusion  in the right lateral region.  4. The mitral valve is normal in structure. Mild to moderate mitral valve  regurgitation. No evidence of mitral stenosis.  5. The aortic valve is tricuspid. There is mild calcification of the  aortic valve. There is mild thickening of the aortic valve. Aortic valve  regurgitation is mild. Mild to moderate aortic valve  sclerosis/calcification is present, without any evidence  of aortic stenosis. Aortic regurgitation PHT measures 702 msec.  6. Aortic dilatation noted. There is moderate dilatation of the ascending  aorta, measuring 44 mm.  7. The inferior vena cava is normal in size with greater than 50%  respiratory variability, suggesting right atrial pressure of 3 mmHg.  8. Compared to prior echo, LVF appears to have decreased and PAP has  increased from 33 to 36mmHg.   Assessment & Plan   1.  Bilateral lower extremity edema-generalized nonpitting bilateral lower extremity edema left  greater in the right. Weight today 131 Heart healthy low-sodium diet-salty 6 given Increase physical activity as tolerated Daily weights-contact office with weight increase of 3 pounds overnight or 5 pounds in 1 week and increased lower extremity edema. Order BMP   Dilated ascending aorta-denies back and chest pain.  Previously measured at 47 mm on 8/19.  Follow-up echocardiogram 05/10/2020 showed dilation of ascending aorta 44 mm. Continue amlodipine Heart healthy low-sodium diet-salty  6 given Increase physical activity as tolerated  Essential hypertension-BP today 105/71.   Continue amlodipine Heart healthy low-sodium diet-salty 6 given Increase physical activity as tolerated  Hyperlipidemia-LDL 158  on 06/15/2017.  Statin intolerant Continue coq.10 Heart healthy low-sodium high-fiber diet Increase physical activity as tolerated  CKD stage IV-creatinine 1.33 on 05/09/2020.  Stable Follows with PCP  Disposition: Follow-up with Dr. Burt Knack or me in 3 months.   Jossie Ng. Jaasia Viglione NP-C    05/20/2020, 2:48 PM [facility] [address] Office [phone] Fax [phone]  Notice: This dictation was prepared with Dragon dictation along with smaller phrase technology. Any transcriptional errors that result from this process are unintentional and may not be corrected upon review.  I spent 10 minutes examining this patient, reviewing medications, and using patient centered shared decision making involving her cardiac care.  Prior to her visit I spent greater than 20 minutes reviewing her past medical history,  medications, and prior cardiac tests.

## 2020-05-20 ENCOUNTER — Encounter: Payer: Self-pay | Admitting: General Practice

## 2020-05-20 ENCOUNTER — Other Ambulatory Visit: Payer: Self-pay

## 2020-05-20 ENCOUNTER — Ambulatory Visit (INDEPENDENT_AMBULATORY_CARE_PROVIDER_SITE_OTHER): Payer: Medicare Other | Admitting: General Practice

## 2020-05-20 VITALS — BP 105/71 | HR 86 | Ht 67.0 in | Wt 131.0 lb

## 2020-05-20 DIAGNOSIS — I77810 Thoracic aortic ectasia: Secondary | ICD-10-CM

## 2020-05-20 DIAGNOSIS — E782 Mixed hyperlipidemia: Secondary | ICD-10-CM

## 2020-05-20 DIAGNOSIS — R600 Localized edema: Secondary | ICD-10-CM | POA: Diagnosis not present

## 2020-05-20 DIAGNOSIS — I10 Essential (primary) hypertension: Secondary | ICD-10-CM | POA: Diagnosis not present

## 2020-05-20 DIAGNOSIS — N184 Chronic kidney disease, stage 4 (severe): Secondary | ICD-10-CM

## 2020-05-20 DIAGNOSIS — Z79899 Other long term (current) drug therapy: Secondary | ICD-10-CM

## 2020-05-20 NOTE — Patient Instructions (Signed)
Medication Instructions:  The current medical regimen is effective;  continue present plan and medications as directed. Please refer to the Current Medication list given to you today.  *If you need a refill on your cardiac medications before your next appointment, please call your pharmacy*  Lab Work:    BMET TODAY -WE WILL CALL WITH RESULTS    Special Instructions CONTINUE TO [place] SALTY 6-ATTACHED-1,800mg  daily  Follow-Up: Your next appointment:  3 month(s) In Person with Sherren Mocha, MD   At [facility], you and your health needs are our priority.  As part of our continuing mission to provide you with exceptional heart care, we have created designated Provider Care Teams.  These Care Teams include your primary Cardiologist (physician) and Advanced Practice Providers (APPs -  Physician Assistants and Nurse Practitioners) who all work together to provide you with the care you need, when you need it.            6 SALTY THINGS TO AVOID     1,800MG  DAILY

## 2020-05-21 ENCOUNTER — Encounter: Payer: Self-pay | Admitting: Internal Medicine

## 2020-05-21 LAB — BASIC METABOLIC PANEL
BUN/Creatinine Ratio: 21 (ref 12–28)
BUN: 48 mg/dL — ABNORMAL HIGH (ref 8–27)
CO2: 33 mmol/L — ABNORMAL HIGH (ref 20–29)
Calcium: 9.7 mg/dL (ref 8.7–10.3)
Chloride: 92 mmol/L — ABNORMAL LOW (ref 96–106)
Creatinine, Ser: 2.32 mg/dL — ABNORMAL HIGH (ref 0.57–1.00)
GFR calc Af Amer: 21 mL/min/{1.73_m2} — ABNORMAL LOW (ref >59)
GFR calc non Af Amer: 19 mL/min/{1.73_m2} — ABNORMAL LOW (ref >59)
Glucose: 133 mg/dL — ABNORMAL HIGH (ref 65–99)
Potassium: 3.2 mmol/L — ABNORMAL LOW (ref 3.5–5.2)
Sodium: 143 mmol/L (ref 134–144)

## 2020-05-21 NOTE — Progress Notes (Incomplete)
Lisa [name] OFFICE PROGRESS NOTE  [name], MD [place] [address]  DIAGNOSIS: ***  PRIOR THERAPY:  CURRENT THERAPY:  INTERVAL HISTORY: Lisa [name] 87 y.o. female returns for *** regular *** visit for followup of ***   MEDICAL HISTORY: Past Medical History:  Diagnosis Date  . Diverticulosis   . Headache   . Hemorrhoids    internal and external  . Hyperlipidemia 01/29/1991  . Hypertension 10/29/1995  . Hypothyroidism 10/29/1995  . Lung mass    right middle lobe  . Melanoma ([place])    h/o, local excision. no chemo or rady.   . Skin cancer (melanoma) ([place])   . Tubular adenoma of colon   . Wears dentures     ALLERGIES:  is allergic to atorvastatin, celebrex [celecoxib], cholecalciferol, simvastatin, and tape.  MEDICATIONS:  Current Outpatient Medications  Medication Sig Dispense Refill  . albuterol (VENTOLIN HFA) 108 (90 Base) MCG/ACT inhaler Inhale 2 puffs into the lungs every 6 (six) hours as needed for wheezing or shortness of breath. 6.7 g 0  . amLODipine (NORVASC) 10 MG tablet Take 1 tablet by mouth once daily 90 tablet 0  . Cholecalciferol (VITAMIN D3) 50 MCG (2000 UT) TABS Take 2,000 Units by mouth 3 (three) times a week.    . Coenzyme Q10 100 MG capsule Take 100 mg by mouth daily.     Marland Kitchen docusate sodium (COLACE) 100 MG capsule Take 100 mg by mouth daily as needed for mild constipation.    . fluticasone (FLONASE) 50 MCG/ACT nasal spray USE 2 SPRAYS IN EACH NOSTRIL DAILY AS NEEDED FOR ALLERGIES 48 g 3  . furosemide (LASIX) 80 MG tablet Take 1 tablet (80 mg total) by mouth daily. Take for 4 days 30 tablet 0  . levothyroxine (SYNTHROID) 75 MCG tablet Take 1 tablet by mouth once daily 90 tablet 1  . loratadine (CLARITIN) 10 MG tablet Take 1 tablet (10 mg total) by mouth daily as needed for allergies or rhinitis.    Marland Kitchen metolazone (ZAROXOLYN) 2.5 MG tablet Take 1 tablet (2.5 mg total) by mouth daily for 2 doses. Take 1 tablet  today (05/14/20) 30 minutes before lasix and 1 tablet (05/16/20) 30 minutes before lasix 2 tablet 0  . mirtazapine (REMERON) 7.5 MG tablet TAKE 1 TABLET BY MOUTH ONCE DAILY AT BEDTIME 30 tablet 0  . Multiple Vitamin (MULTIVITAMIN WITH MINERALS) TABS tablet Take 1 tablet by mouth daily.    . potassium chloride SA (KLOR-CON M20) 20 MEQ tablet Take 1 tablet (20 mEq total) by mouth daily. Take for 4 days 30 tablet 0  . sennosides-docusate sodium (SENOKOT-S) 8.6-50 MG tablet Take 1 tablet by mouth daily as needed for constipation.     No current facility-administered medications for this visit.   Facility-Administered Medications Ordered in Other Visits  Medication Dose Route Frequency Provider Last Rate Last Admin  . acetaminophen (TYLENOL) tablet 650 mg  650 mg Oral Once Nicholas Lose, MD        SURGICAL HISTORY:  Past Surgical History:  Procedure Laterality Date  . BRONCHIAL BIOPSY  11/23/2017   Procedure: BRONCHIAL BIOPSIES;  Surgeon: Grace Isaac, MD;  Location: [facility] OR;  Service: Thoracic;;  . cataract surgery  2007, 2008   repair bilat  . DG BARIUM ENEMA ([place] HX) N/A 2016   [name]  . HERNIA REPAIR  04/25/2001  . HIP ARTHROPLASTY Left 05/31/2019   Procedure: LEFT HIP HEMIARTHROPLASTY;  Surgeon: Griffin Basil,  Laretta Alstrom, MD;  Location: [facility];  Service: Orthopedics;  Laterality: Left;  . IR IMAGING GUIDED PORT INSERTION  07/05/2018  . IR US GUIDE BX ASP/DRAIN  11/17/2017  . KNEE SURGERY     meniscus tear per Dr. Ronnie Derby, R knee  . MULTIPLE TOOTH EXTRACTIONS    . SEPTOPLASTY  2006   Dr. Ernesto Rutherford  . SKIN CANCER EXCISION    . TONSILLECTOMY  1954  . VAGINAL HYSTERECTOMY    . VIDEO BRONCHOSCOPY WITH ENDOBRONCHIAL ULTRASOUND N/A 11/23/2017   Procedure: VIDEO BRONCHOSCOPY WITH ENDOBRONCHIAL ULTRASOUND;  Surgeon: Grace Isaac, MD;  Location: [place];  Service: Thoracic;  Laterality: N/A;    REVIEW OF SYSTEMS:   Review of Systems  Constitutional: Negative for appetite change, chills, fatigue,  fever and unexpected weight change.  HENT:   Negative for mouth sores, nosebleeds, sore throat and trouble swallowing.   Eyes: Negative for eye problems and icterus.  Respiratory: Negative for cough, hemoptysis, shortness of breath and wheezing.   Cardiovascular: Negative for chest pain and leg swelling.  Gastrointestinal: Negative for abdominal pain, constipation, diarrhea, nausea and vomiting.  Genitourinary: Negative for bladder incontinence, difficulty urinating, dysuria, frequency and hematuria.   Musculoskeletal: Negative for back pain, gait problem, neck pain and neck stiffness.  Skin: Negative for itching and rash.  Neurological: Negative for dizziness, extremity weakness, gait problem, headaches, light-headedness and seizures.  Hematological: Negative for adenopathy. Does not bruise/bleed easily.  Psychiatric/Behavioral: Negative for confusion, depression and sleep disturbance. The patient is not nervous/anxious.     PHYSICAL EXAMINATION:  There were no vitals taken for this visit.  ECOG PERFORMANCE STATUS: {CHL ONC ECOG [id number]  Physical Exam  Constitutional: Oriented to person, place, and time and well-developed, well-nourished, and in no distress. No distress.  HENT:  Head: Normocephalic and atraumatic.  Mouth/Throat: Oropharynx is clear and moist. No oropharyngeal exudate.  Eyes: Conjunctivae are normal. Right eye exhibits no discharge. Left eye exhibits no discharge. No scleral icterus.  Neck: Normal range of motion. Neck supple.  Cardiovascular: Normal rate, regular rhythm, normal heart sounds and intact distal pulses.   Pulmonary/Chest: Effort normal and breath sounds normal. No respiratory distress. No wheezes. No rales.  Abdominal: Soft. Bowel sounds are normal. Exhibits no distension and no mass. There is no tenderness.  Musculoskeletal: Normal range of motion. Exhibits no edema.  Lymphadenopathy:    No cervical adenopathy.  Neurological: Alert and oriented  to person, place, and time. Exhibits normal muscle tone. Gait normal. Coordination normal.  Skin: Skin is warm and dry. No rash noted. Not diaphoretic. No erythema. No pallor.  Psychiatric: Mood, memory and judgment normal.  Vitals reviewed.  LABORATORY DATA: Lab Results  Component Value Date   WBC 6.4 05/09/2020   HGB 8.5 (L) 05/09/2020   HCT 28.7 (L) 05/09/2020   MCV 92.6 05/09/2020   PLT 132 (L) 05/09/2020      Chemistry      Component Value Date/Time   NA 143 05/20/2020 1505   K 3.2 (L) 05/20/2020 1505   CL 92 (L) 05/20/2020 1505   CO2 33 (H) 05/20/2020 1505   BUN 48 (H) 05/20/2020 1505   CREATININE 2.32 (H) 05/20/2020 1505   CREATININE 1.33 (H) 05/09/2020 0952      Component Value Date/Time   CALCIUM 9.7 05/20/2020 1505   ALKPHOS 86 05/09/2020 0952   AST 18 05/09/2020 0952   ALT 11 05/09/2020 0952   BILITOT 0.3 05/09/2020 0952       RADIOGRAPHIC  STUDIES:  DG Chest 1 View  Result Date: 05/09/2020 CLINICAL DATA:  Non-small cell lung cancer, pleural effusion EXAM: CHEST  1 VIEW COMPARISON:  05/30/2019 FINDINGS: Single frontal view of the chest demonstrates right chest wall port via internal jugular approach tip overlying superior vena cava. The cardiac silhouette is unremarkable. There is increasing masslike consolidation at the right lung base, with an enlarging right pleural effusion. No pneumothorax. Left chest is clear. IMPRESSION: 1. Enlarging masslike consolidation at the right lung base, with enlarging right pleural effusion. Electronically Signed   By: Randa Ngo M.D.   On: 05/09/2020 16:38   ECHOCARDIOGRAM COMPLETE  Result Date: 05/10/2020    ECHOCARDIOGRAM REPORT   Patient Name:   Lisa [name] Date of Exam: 05/10/2020 Medical Rec #:  [id number]     Height:       67.0 in Accession #:    [id number]    Weight:       166.1 lb Date of Birth:  01/12/1933     BSA:          1.869 m Patient Age:    88 years      BP:           146/82 mmHg Patient Gender: F              HR:           61 bpm. Exam Location:  [facility] Procedure: 2D Echo, Cardiac Doppler and Color Doppler Indications:    R60.9 Swelling  History:        Patient has prior history of Echocardiogram examinations, most                 recent 10/29/2017. Risk Factors:Hypertension and Dyslipidemia.                 DVT. Closed left hip fracture. Hypothyroidism. Bilateral leg                 edema. Right lung cancer. Chemotherapy.  Sonographer:    Diamond Nickel RCS Referring Phys: [place] Comments: Technically difficult study due to inability to position patient properly. IMPRESSIONS  1. Left ventricular ejection fraction, by estimation, is 40 to 45%. The left ventricle has mildly decreased function. The left ventricle demonstrates global hypokinesis. The left ventricular internal cavity size was moderately dilated. Left ventricular diastolic parameters are consistent with Grade I diastolic dysfunction (impaired relaxation). Elevated left ventricular end-diastolic pressure.  2. Right ventricular systolic function is normal. The right ventricular size is normal. There is moderately elevated pulmonary artery systolic pressure. The estimated right ventricular systolic pressure is 27.7 mmHg.  3. The pericardial effusion is circumferential. Moderate pleural effusion in the right lateral region.  4. The mitral valve is normal in structure. Mild to moderate mitral valve regurgitation. No evidence of mitral stenosis.  5. The aortic valve is tricuspid. There is mild calcification of the aortic valve. There is mild thickening of the aortic valve. Aortic valve regurgitation is mild. Mild to moderate aortic valve sclerosis/calcification is present, without any evidence of aortic stenosis. Aortic regurgitation PHT measures 702 msec.  6. Aortic dilatation noted. There is moderate dilatation of the ascending aorta, measuring 44 mm.  7. The inferior vena cava is normal in size with greater than 50% respiratory  variability, suggesting right atrial pressure of 3 mmHg.  8. Compared to prior echo, LVF appears to have decreased and PAP has increased from 33 to 66mmHg. FINDINGS  Left  Ventricle: Left ventricular ejection fraction, by estimation, is 40 to 45%. The left ventricle has mildly decreased function. The left ventricle demonstrates global hypokinesis. The left ventricular internal cavity size was moderately dilated. There is no left ventricular hypertrophy. Left ventricular diastolic parameters are consistent with Grade I diastolic dysfunction (impaired relaxation). Elevated left ventricular end-diastolic pressure. Right Ventricle: The right ventricular size is normal. No increase in right ventricular wall thickness. Right ventricular systolic function is normal. There is moderately elevated pulmonary artery systolic pressure. The tricuspid regurgitant velocity is 3.09 m/s, and with an assumed right atrial pressure of 8 mmHg, the estimated right ventricular systolic pressure is 92.1 mmHg. Left Atrium: Left atrial size was normal in size. Right Atrium: Right atrial size was normal in size. Pericardium: Trivial pericardial effusion is present. The pericardial effusion is circumferential. Mitral Valve: The mitral valve is normal in structure. Mild to moderate mitral valve regurgitation. No evidence of mitral valve stenosis. Tricuspid Valve: The tricuspid valve is normal in structure. Tricuspid valve regurgitation is mild . No evidence of tricuspid stenosis. Aortic Valve: The aortic valve is tricuspid. There is mild calcification of the aortic valve. There is mild thickening of the aortic valve. Aortic valve regurgitation is mild. Aortic regurgitation PHT measures 702 msec. Mild to moderate aortic valve sclerosis/calcification is present, without any evidence of aortic stenosis. Pulmonic Valve: The pulmonic valve was normal in structure. Pulmonic valve regurgitation is mild. No evidence of pulmonic stenosis. Aorta: Aortic  dilatation noted. There is moderate dilatation of the ascending aorta, measuring 44 mm. Venous: The inferior vena cava is normal in size with greater than 50% respiratory variability, suggesting right atrial pressure of 3 mmHg. IAS/Shunts: No atrial level shunt detected by color flow Doppler. Additional Comments: There is a moderate pleural effusion in the right lateral region. Mild ascites is present.  LEFT VENTRICLE PLAX 2D LVIDd:         6.00 cm  Diastology LVIDs:         4.80 cm  LV e' medial:    3.65 cm/s LV PW:         1.10 cm  LV E/e' medial:  20.5 LV IVS:        0.90 cm  LV e' lateral:   5.59 cm/s LVOT diam:     2.20 cm  LV E/e' lateral: 13.4 LV SV:         82 LV SV Index:   44 LVOT Area:     3.80 cm  RIGHT VENTRICLE RV Basal diam:  2.60 cm RV S prime:     9.24 cm/s TAPSE (M-mode): 2.3 cm RVSP:           46.2 mmHg LEFT ATRIUM           Index       RIGHT ATRIUM           Index LA diam:      3.70 cm 1.98 cm/m  RA Pressure: 8.00 mmHg LA Vol (A2C): 52.3 ml 27.99 ml/m RA Area:     12.80 cm LA Vol (A4C): 46.9 ml 25.10 ml/m RA Volume:   27.70 ml  14.82 ml/m  AORTIC VALVE LVOT Vmax:   96.57 cm/s LVOT Vmean:  60.967 cm/s LVOT VTI:    0.215 m AI PHT:      702 msec  AORTA Ao Root diam: 3.30 cm MITRAL VALVE                TRICUSPID VALVE MV Area (PHT):  4.49 cm     TR Peak grad:   38.2 mmHg MV Decel Time: 169 msec     TR Vmax:        309.00 cm/s MR Peak grad: 55.4 mmHg     Estimated RAP:  8.00 mmHg MR Mean grad: 30.0 mmHg     RVSP:           46.2 mmHg MR Vmax:      372.00 cm/s MR Vmean:     258.0 cm/s    SHUNTS MV E velocity: 74.83 cm/s   Systemic VTI:  0.22 m MV A velocity: 102.33 cm/s  Systemic Diam: 2.20 cm MV E/A ratio:  0.73 Fransico Him MD Electronically signed by Fransico Him MD Signature Date/Time: 05/10/2020/12:06:20 PM    Final      ASSESSMENT/PLAN:  No problem-specific Assessment & Plan notes found for this encounter.   No orders of the defined types were placed in this encounter.    I spent  {CHL ONC TIME VISIT - [id number]} counseling the patient face to face. The total time spent in the appointment was {CHL ONC TIME VISIT - [id number]}.  Cassandra L Heilingoetter, PA-C 05/21/20

## 2020-05-23 ENCOUNTER — Telehealth: Payer: Self-pay | Admitting: Internal Medicine

## 2020-05-23 NOTE — Telephone Encounter (Signed)
Rescheduled appt per 2/23 sch msg - unable to reach pt . Left message with appt date and time

## 2020-05-28 ENCOUNTER — Other Ambulatory Visit: Payer: Self-pay

## 2020-05-28 DIAGNOSIS — R609 Edema, unspecified: Secondary | ICD-10-CM

## 2020-05-28 DIAGNOSIS — Z79899 Other long term (current) drug therapy: Secondary | ICD-10-CM

## 2020-05-28 DIAGNOSIS — E782 Mixed hyperlipidemia: Secondary | ICD-10-CM

## 2020-05-28 DIAGNOSIS — I10 Essential (primary) hypertension: Secondary | ICD-10-CM

## 2020-05-28 DIAGNOSIS — N184 Chronic kidney disease, stage 4 (severe): Secondary | ICD-10-CM

## 2020-05-28 DIAGNOSIS — R600 Localized edema: Secondary | ICD-10-CM

## 2020-05-29 ENCOUNTER — Other Ambulatory Visit: Payer: Self-pay | Admitting: Family Medicine

## 2020-05-30 ENCOUNTER — Other Ambulatory Visit: Payer: Medicare Other

## 2020-05-30 ENCOUNTER — Ambulatory Visit: Payer: Medicare Other | Admitting: Physician Assistant

## 2020-05-30 ENCOUNTER — Ambulatory Visit: Payer: Medicare Other

## 2020-05-30 ENCOUNTER — Encounter: Payer: Self-pay | Admitting: Internal Medicine

## 2020-05-30 ENCOUNTER — Encounter: Payer: Self-pay | Admitting: Family Medicine

## 2020-05-31 ENCOUNTER — Other Ambulatory Visit: Payer: Self-pay | Admitting: Family Medicine

## 2020-05-31 DIAGNOSIS — C3491 Malignant neoplasm of unspecified part of right bronchus or lung: Secondary | ICD-10-CM

## 2020-05-31 MED ORDER — HYDROXYZINE HCL 10 MG PO TABS: 5.0000 mg | ORAL_TABLET | Freq: Three times a day (TID) | ORAL | 1 refills | Status: DC | PRN

## 2020-05-31 NOTE — Progress Notes (Signed)
[facility] OFFICE PROGRESS NOTE  [name], MD [place] [address]  DIAGNOSIS: Recurrent non-small cell lung cancer initially diagnosed stage IIIB (T3, N3, M0) non-small cell lung cancer, squamous cell carcinoma presented with large right middle lobe lung mass in addition to right hilar, subcarinal and right supraclavicular lymphadenopathy diagnosed in August 2019. The patient had evidence for disease progression in December 2020.  PRIOR THERAPY: 1) Concurrent chemoradiation with weekly carboplatin for AUC of 2 and paclitaxel 45 NG/M2. Status post 6 cycles with partial response. 2) Consolidation immunotherapy with Imfinzi 10 mg/KG every 2 weeks started February 28, 2018. Status post 26 cycles. Last cycle was given on February 21, 2019 3) Systemic chemotherapy with carboplatin for an AUC of 5, paclitaxel 175 mg/m and Keytruda 200 mg IV every 3 weeks with Neulasta support. Last dose 04/18/20.Status post18cycles.Starting from cycle #5 she was on single agent maintenance Keytruda. Discontinued due to decline in health/comorbidities not due to disease progression.   CURRENT THERAPY: None   INTERVAL HISTORY: Lisa [name] 87 y.o. female returns to the clinic today for a follow up visit accompanied by her granddaughter. The patient was last seen in the clinic on 05/09/20. At that appointment, the patient was having increased weight gain, lower extremity swelling, labored breathing, and sleeping more. Dr. Julien Nordmann held her treatment and recommended follow up with cardiology regarding her history of congestive heart failure. She also showed a pleural effusion on CXR. Thoracentesis was offered but was cancelled due to patient testing positive for COVID-19 and due to improvement in her breathing due to lasix. She followed up with cardiology which they were able to get an echo and lasix. She lost weight with the lasix.   However, the patient then started  having a decline in her functional status, memory, and behavior changes. Per the patient's granddaughter, she paces the halls at nighttime, very anxious and quick to anger with loved ones, unable to dress herself appropriately (wearing shoes on the wrong foot, pants inside out), challenges with ADLs (she has to be regularly cleaned, washed, and supervised at all times). They made an appointment with the PCP to reassess her mental status so they can attain proper home healthcare for her. They were thinking about discontinuing her cancer treatment and to focus more on palliative care/hospice care. They had a consultation with palliative care for 06/21/20; however, her granddaughter stated palliative care is going to try to fit her in to evaluate her sooner later this week.   Today, the patient is feeling sleepy. She also is confused and believes her sister who has passed away several years ago is going to pick her up from her appointment. They have some assistive devices at home and do not feel that she needs any at this time. They are wondering if she can increase her 7.5 mg dose of remeron to help her with her insomnia. Denies any abnormal bleeding or bruising. She is here for evaluation and to discuss her current condition and whether to discontinue or proceed with treatment.    MEDICAL HISTORY: Past Medical History:  Diagnosis Date  . Diverticulosis   . Headache   . Hemorrhoids    internal and external  . Hyperlipidemia 01/29/1991  . Hypertension 10/29/1995  . Hypothyroidism 10/29/1995  . Lung mass    right middle lobe  . Melanoma ([place])    h/o, local excision. no chemo or rady.   . Skin cancer (melanoma) ([place])   .  Tubular adenoma of colon   . Wears dentures     ALLERGIES:  is allergic to atorvastatin, celebrex [celecoxib], cholecalciferol, simvastatin, and tape.  MEDICATIONS:  Current Outpatient Medications  Medication Sig Dispense Refill  . albuterol (VENTOLIN HFA) 108 (90 Base)  MCG/ACT inhaler Inhale 2 puffs into the lungs every 6 (six) hours as needed for wheezing or shortness of breath. 6.7 g 0  . amLODipine (NORVASC) 10 MG tablet Take 1 tablet (10 mg total) by mouth daily. Will need office visit with PCP for future refills. 90 tablet 0  . Cholecalciferol (VITAMIN D3) 50 MCG (2000 UT) TABS Take 2,000 Units by mouth 3 (three) times a week.    . Coenzyme Q10 100 MG capsule Take 100 mg by mouth daily.     Marland Kitchen docusate sodium (COLACE) 100 MG capsule Take 100 mg by mouth daily as needed for mild constipation.    . fluticasone (FLONASE) 50 MCG/ACT nasal spray USE 2 SPRAYS IN EACH NOSTRIL DAILY AS NEEDED FOR ALLERGIES 48 g 3  . hydrOXYzine (ATARAX/VISTARIL) 10 MG tablet Take 0.5-1 tablets (5-10 mg total) by mouth 3 (three) times daily as needed for anxiety. 30 tablet 1  . levothyroxine (SYNTHROID) 75 MCG tablet Take 1 tablet by mouth once daily 90 tablet 1  . mirtazapine (REMERON) 7.5 MG tablet TAKE 1 TABLET BY MOUTH ONCE DAILY AT BEDTIME 30 tablet 0  . Multiple Vitamin (MULTIVITAMIN WITH MINERALS) TABS tablet Take 1 tablet by mouth daily.    . sennosides-docusate sodium (SENOKOT-S) 8.6-50 MG tablet Take 1 tablet by mouth daily as needed for constipation.    Marland Kitchen loratadine (CLARITIN) 10 MG tablet Take 1 tablet (10 mg total) by mouth daily as needed for allergies or rhinitis. (Patient not taking: Reported on 06/04/2020)     No current facility-administered medications for this visit.   Facility-Administered Medications Ordered in Other Visits  Medication Dose Route Frequency Provider Last Rate Last Admin  . acetaminophen (TYLENOL) tablet 650 mg  650 mg Oral Once Nicholas Lose, MD        SURGICAL HISTORY:  Past Surgical History:  Procedure Laterality Date  . BRONCHIAL BIOPSY  11/23/2017   Procedure: BRONCHIAL BIOPSIES;  Surgeon: Grace Isaac, MD;  Location: [facility] OR;  Service: Thoracic;;  . cataract surgery  2007, 2008   repair bilat  . DG BARIUM ENEMA ([place] [facility]) N/A 2016    [name]  . HERNIA REPAIR  04/25/2001  . HIP ARTHROPLASTY Left 05/31/2019   Procedure: LEFT HIP HEMIARTHROPLASTY;  Surgeon: Hiram Gash, MD;  Location: [facility];  Service: Orthopedics;  Laterality: Left;  . IR IMAGING GUIDED PORT INSERTION  07/05/2018  . IR US GUIDE BX ASP/DRAIN  11/17/2017  . KNEE SURGERY     meniscus tear per Dr. Ronnie Derby, R knee  . MULTIPLE TOOTH EXTRACTIONS    . SEPTOPLASTY  2006   Dr. Ernesto Rutherford  . SKIN CANCER EXCISION    . TONSILLECTOMY  1954  . VAGINAL HYSTERECTOMY    . VIDEO BRONCHOSCOPY WITH ENDOBRONCHIAL ULTRASOUND N/A 11/23/2017   Procedure: VIDEO BRONCHOSCOPY WITH ENDOBRONCHIAL ULTRASOUND;  Surgeon: Grace Isaac, MD;  Location: [facility];  Service: Thoracic;  Laterality: N/A;    REVIEW OF SYSTEMS:   Review of Systems  Constitutional: Positive for fatigue and generalized weakness. Positive for weight change. Negative for appetite change, fever, or chills. HENT: Negative for mouth sores, nosebleeds, sore throat and trouble swallowing.   Eyes: Negative for eye problems and icterus.  Respiratory:  Positive for cough. Positive for increased labored breathing. Negative for hemoptysis and wheezing.   Cardiovascular: Positive for bilateral lower extremity swelling. Negative for chest pain. Gastrointestinal: Negative for abdominal pain, constipation, diarrhea, nausea and vomiting.  Genitourinary: Negative for bladder incontinence, difficulty urinating, dysuria, frequency and hematuria.   Musculoskeletal: Negative for back pain, gait problem, neck pain and neck stiffness.  Skin: Negative for itching and rash.  Neurological: Negative for dizziness, extremity weakness, gait problem, headaches, light-headedness and seizures.  Hematological: Negative for adenopathy. Does not bruise/bleed easily.  Psychiatric/Behavioral: Positive for confusion, dementia, and sleep disturbances.    PHYSICAL EXAMINATION:  Blood pressure (!) 97/56, pulse 87, temperature (!) 97.3 F (36.3 C),  temperature source Tympanic, resp. rate 17, height 5\' 7"  (1.702 m), weight 144 lb 1.6 oz (65.4 kg), SpO2 94 %.  ECOG PERFORMANCE STATUS: 3 - Symptomatic, >50% confined to bed  Physical Exam  Constitutional: Oriented to person, place, and time and well-developed, well-nourished, and in no distress.  HENT:  Head: Normocephalic and atraumatic.  Mouth/Throat: Oropharynx is clear and moist. No oropharyngeal exudate.  Eyes: Conjunctivae are normal. Right eye exhibits no discharge. Left eye exhibits no discharge. No scleral icterus.  Neck: Normal range of motion. Neck supple.  Cardiovascular: Normal rate, regular rhythm, normal heart sounds and intact distal pulses.   Pulmonary/Chest: Effort normal. Decreased breath sounds in right lung. No respiratory distress. No wheezes. No rales.  Abdominal: Soft. Bowel sounds are normal. Exhibits no distension and no mass. There is no tenderness.  Musculoskeletal: Positive for bilateral lower extremity edema. Normal range of motion.  Lymphadenopathy:    No cervical adenopathy.  Neurological: Alert and oriented to person, place, and time. Exhibits muscle wasting. Examined in the wheelchair.  Skin: Skin is warm and dry. No rash noted. Not diaphoretic. No erythema. No pallor.  Psychiatric: Positive for memory deficits and confusion.  Vitals reviewed.  LABORATORY DATA: Lab Results  Component Value Date   WBC 11.8 (H) 06/04/2020   HGB 8.0 (L) 06/04/2020   HCT 27.4 (L) 06/04/2020   MCV 92.3 06/04/2020   PLT 211 06/04/2020      Chemistry      Component Value Date/Time   NA 141 06/04/2020 1418   NA 143 05/20/2020 1505   K 4.4 06/04/2020 1418   CL 104 06/04/2020 1418   CO2 28 06/04/2020 1418   BUN 38 (H) 06/04/2020 1418   BUN 48 (H) 05/20/2020 1505   CREATININE 1.85 (H) 06/04/2020 1418      Component Value Date/Time   CALCIUM 9.0 06/04/2020 1418   ALKPHOS 119 06/04/2020 1418   AST 27 06/04/2020 1418   ALT 29 06/04/2020 1418   BILITOT 0.4  06/04/2020 1418       RADIOGRAPHIC STUDIES:  DG Chest 1 View  Result Date: 05/09/2020 CLINICAL DATA:  Non-small cell lung cancer, pleural effusion EXAM: CHEST  1 VIEW COMPARISON:  05/30/2019 FINDINGS: Single frontal view of the chest demonstrates right chest wall port via internal jugular approach tip overlying superior vena cava. The cardiac silhouette is unremarkable. There is increasing masslike consolidation at the right lung base, with an enlarging right pleural effusion. No pneumothorax. Left chest is clear. IMPRESSION: 1. Enlarging masslike consolidation at the right lung base, with enlarging right pleural effusion. Electronically Signed   By: Randa Ngo M.D.   On: 05/09/2020 16:38   ECHOCARDIOGRAM COMPLETE  Result Date: 05/10/2020    ECHOCARDIOGRAM REPORT   Patient Name:   Lisa [name] Date of  Exam: 05/10/2020 Medical Rec #:  [id number]     Height:       67.0 in Accession #:    [id number]    Weight:       166.1 lb Date of Birth:  [birth year]     BSA:          1.869 m Patient Age:    74 years      BP:           146/82 mmHg Patient Gender: F             HR:           61 bpm. Exam Location:  [facility] Procedure: 2D Echo, Cardiac Doppler and Color Doppler Indications:    R60.9 Swelling  History:        Patient has prior history of Echocardiogram examinations, most                 recent 10/29/2017. Risk Factors:Hypertension and Dyslipidemia.                 DVT. Closed left hip fracture. Hypothyroidism. Bilateral leg                 edema. Right lung cancer. Chemotherapy.  Sonographer:    Diamond Nickel RCS Referring Phys: [place] Comments: Technically difficult study due to inability to position patient properly. IMPRESSIONS  1. Left ventricular ejection fraction, by estimation, is 40 to 45%. The left ventricle has mildly decreased function. The left ventricle demonstrates global hypokinesis. The left ventricular internal cavity size was moderately dilated. Left  ventricular diastolic parameters are consistent with Grade I diastolic dysfunction (impaired relaxation). Elevated left ventricular end-diastolic pressure.  2. Right ventricular systolic function is normal. The right ventricular size is normal. There is moderately elevated pulmonary artery systolic pressure. The estimated right ventricular systolic pressure is 54.0 mmHg.  3. The pericardial effusion is circumferential. Moderate pleural effusion in the right lateral region.  4. The mitral valve is normal in structure. Mild to moderate mitral valve regurgitation. No evidence of mitral stenosis.  5. The aortic valve is tricuspid. There is mild calcification of the aortic valve. There is mild thickening of the aortic valve. Aortic valve regurgitation is mild. Mild to moderate aortic valve sclerosis/calcification is present, without any evidence of aortic stenosis. Aortic regurgitation PHT measures 702 msec.  6. Aortic dilatation noted. There is moderate dilatation of the ascending aorta, measuring 44 mm.  7. The inferior vena cava is normal in size with greater than 50% respiratory variability, suggesting right atrial pressure of 3 mmHg.  8. Compared to prior echo, LVF appears to have decreased and PAP has increased from 33 to 52mmHg. FINDINGS  Left Ventricle: Left ventricular ejection fraction, by estimation, is 40 to 45%. The left ventricle has mildly decreased function. The left ventricle demonstrates global hypokinesis. The left ventricular internal cavity size was moderately dilated. There is no left ventricular hypertrophy. Left ventricular diastolic parameters are consistent with Grade I diastolic dysfunction (impaired relaxation). Elevated left ventricular end-diastolic pressure. Right Ventricle: The right ventricular size is normal. No increase in right ventricular wall thickness. Right ventricular systolic function is normal. There is moderately elevated pulmonary artery systolic pressure. The tricuspid  regurgitant velocity is 3.09 m/s, and with an assumed right atrial pressure of 8 mmHg, the estimated right ventricular systolic pressure is 08.6 mmHg. Left Atrium: Left atrial size was normal in size. Right Atrium: Right atrial size was normal  in size. Pericardium: Trivial pericardial effusion is present. The pericardial effusion is circumferential. Mitral Valve: The mitral valve is normal in structure. Mild to moderate mitral valve regurgitation. No evidence of mitral valve stenosis. Tricuspid Valve: The tricuspid valve is normal in structure. Tricuspid valve regurgitation is mild . No evidence of tricuspid stenosis. Aortic Valve: The aortic valve is tricuspid. There is mild calcification of the aortic valve. There is mild thickening of the aortic valve. Aortic valve regurgitation is mild. Aortic regurgitation PHT measures 702 msec. Mild to moderate aortic valve sclerosis/calcification is present, without any evidence of aortic stenosis. Pulmonic Valve: The pulmonic valve was normal in structure. Pulmonic valve regurgitation is mild. No evidence of pulmonic stenosis. Aorta: Aortic dilatation noted. There is moderate dilatation of the ascending aorta, measuring 44 mm. Venous: The inferior vena cava is normal in size with greater than 50% respiratory variability, suggesting right atrial pressure of 3 mmHg. IAS/Shunts: No atrial level shunt detected by color flow Doppler. Additional Comments: There is a moderate pleural effusion in the right lateral region. Mild ascites is present.  LEFT VENTRICLE PLAX 2D LVIDd:         6.00 cm  Diastology LVIDs:         4.80 cm  LV e' medial:    3.65 cm/s LV PW:         1.10 cm  LV E/e' medial:  20.5 LV IVS:        0.90 cm  LV e' lateral:   5.59 cm/s LVOT diam:     2.20 cm  LV E/e' lateral: 13.4 LV SV:         82 LV SV Index:   44 LVOT Area:     3.80 cm  RIGHT VENTRICLE RV Basal diam:  2.60 cm RV S prime:     9.24 cm/s TAPSE (M-mode): 2.3 cm RVSP:           46.2 mmHg LEFT ATRIUM            Index       RIGHT ATRIUM           Index LA diam:      3.70 cm 1.98 cm/m  RA Pressure: 8.00 mmHg LA Vol (A2C): 52.3 ml 27.99 ml/m RA Area:     12.80 cm LA Vol (A4C): 46.9 ml 25.10 ml/m RA Volume:   27.70 ml  14.82 ml/m  AORTIC VALVE LVOT Vmax:   96.57 cm/s LVOT Vmean:  60.967 cm/s LVOT VTI:    0.215 m AI PHT:      702 msec  AORTA Ao Root diam: 3.30 cm MITRAL VALVE                TRICUSPID VALVE MV Area (PHT): 4.49 cm     TR Peak grad:   38.2 mmHg MV Decel Time: 169 msec     TR Vmax:        309.00 cm/s MR Peak grad: 55.4 mmHg     Estimated RAP:  8.00 mmHg MR Mean grad: 30.0 mmHg     RVSP:           46.2 mmHg MR Vmax:      372.00 cm/s MR Vmean:     258.0 cm/s    SHUNTS MV E velocity: 74.83 cm/s   Systemic VTI:  0.22 m MV A velocity: 102.33 cm/s  Systemic Diam: 2.20 cm MV E/A ratio:  0.73 Fransico Him MD Electronically signed by Fransico Him MD Signature Date/Time:  05/10/2020/12:06:20 PM    Final      ASSESSMENT/PLAN:  This is a very pleasant 87 year old Caucasian female withrecurrent non-small cell lung cancer initially diagnosed stage IIIB (T3, N3, M0) non-small cell lung cancer, squamous cell carcinoma. She presented with large right middle lobe lung mass in addition to right hilar, subcarinal and right supraclavicular lymphadenopathy diagnosed in August 2019.The patient had evidence for disease progression in December 2020.  She completed a course of concurrent chemoradiation with carboplatin for an AUC of 2 and paclitaxel 45 mg/m. She is status post 6 cycles with a partial response.  She then completed consolidation immunotherapy with Imfinzi 10 mg/kg IV every 2 weeks. She is status post 26 cycles. She showed evidence of disease progression on her restaging CT scan.  The patient is currently undergoing treatment with carboplatin for an AUC of 5, paclitaxel 175 mg/m, Keytruda 200 mg IV every 3 weeks with Neulasta support.She is status post18cycles. She was hospitalized for a left  hip fracturein March 2021. Her treatment was on hold while she was recovering.Startingfrom cycle #5, the patientstartedmaintenance treatment with single agent immunotherapy withKeytruda 200 mg IV every 3 weeks.  The patient was seen with Dr. Julien Nordmann today. The patient has been having a declined in her functional status and cognition over the last few weeks. The patient's family has been exploring hospice vs. Palliative care vs. Home health assistance. They also have discussed continuing vs stopping her immunotherapy treatments. Dr. Julien Nordmann had a lengthy discussion with the patient and her granddaughter about her current condition and options. Discussed the difference between home health, palliative care, and hospice care. After a discussion, the patient's granddaughter agreed that the patient would likely benefit the most from hospice as they are better suited for end of life care and for closer monitoring/assistance. I will place the referral.   We will not schedule any future appointments at this time but would be happy to see the patient on an as needed bases.   Regarding her labs today, her Hbg is 8.0. We will arrange for the patient to receive 1 unit of blood this week. Due to her CHF, would recommend transfusing 1 unit over 3 hours. I will also arrange for the patient to receive 20 meq of lasix.   Discussed it is ok to take 15 mg of remeron at night. When she is due for her next refill, we will be happy to change the dose. For now, they were instructed to take 2 7.5 mg tablets before bed).   Regarding her right pleural effusion, I gave the patient and her granddaughter the option of a palliative/therapeutic thoracentesis. They would like to hold off on that for now as the patient is not very symptomatic and noticed improvement in her breathing with her lasix use. They will call our office if they wish to proceed with that.   The patient was advised to call immediately if she has any  concerning symptoms in the interval. The patient voices understanding of current disease status and treatment options and is in agreement with the current care plan. All questions were answered. The patient knows to call the clinic with any problems, questions or concerns. We can certainly see the patient much sooner if necessary          Orders Placed This Encounter  Procedures  . Informed Consent Details: Physician/Practitioner Attestation; Transcribe to consent form and obtain patient signature    Standing Status:   Future    Standing Expiration Date:  06/04/2021    Order Specific Question:   Physician/Practitioner attestation of informed consent for blood and or blood product transfusion    Answer:   I, the physician/practitioner, attest that I have discussed with the patient the benefits, risks, side effects, alternatives, likelihood of achieving goals and potential problems during recovery for the procedure that I have provided informed consent.    Order Specific Question:   Product(s)    Answer:   All Product(s)  . Care order/instruction    Transfuse Parameters    Standing Status:   Future    Standing Expiration Date:   06/04/2021  . Type and screen    Standing Status:   Future    Standing Expiration Date:   06/04/2021     I spent 30-39 minutes in this encounter.   Larrisa Cravey L Aloha Bartok, PA-C 06/04/20  ADDENDUM: Hematology/Oncology Attending: I had a face-to-face encounter with the patient today.  I reviewed her records and recommended her care plan.  This is a very pleasant 87 years old white female with metastatic non-small cell lung cancer, squamous cell carcinoma that was initially diagnosed as stage IIIb in August 2019 status post concurrent chemoradiation followed by consolidation treatment with Imfinzi with evidence for disease progression in December 2020.  The patient was a started on systemic chemotherapy with carboplatin, paclitaxel and Keytruda followed by  maintenance treatment with single agent Keytruda every 3 weeks status post a total dose of 18 cycles. She has been tolerating her treatment well but over the last few months she has more decline in her general condition with more confusion and memory issues as well as fatigue and congestive heart failure as well as reaccumulation of right pleural effusion. The family has concern about her significant decline and they have been seeking a palliative approach to her care at this point. Her last CT scan of the chest, abdomen pelvis in December 2021 was not concerning for any significant disease progression. I had a lengthy discussion with the patient and her granddaughter Luiz Iron today about her current condition, prognosis and future treatment plans. I am in agreement with Emilee and the family to consider switch to hospice and palliative care at this point even in the absence of disease progression but her quality of life is deteriorating recently. We will continue to see the patient on as-needed basis and assist in her management while she is on the hospice care. For the anemia, I will arrange for the patient to receive 1 unit of PRBCs transfusion in the next few days. She was advised to call immediately if she has any concerning symptoms in the interval. The total time spent in the appointment was 45 minutes.  Disclaimer: This note was dictated with voice recognition software. Similar sounding words can inadvertently be transcribed and may be missed upon review. Eilleen Kempf, MD 06/04/20

## 2020-05-31 NOTE — Telephone Encounter (Signed)
I have spoken with the pts daughter who advised this is only an FYI to [place] as they have an appt with pts PCP regarding her increased cognitive and behavior issues. I shared with her the differences between palliative vs hospice care and she expressed understanding and states they are considering all options and nearing the time to make a final decision. She states the desire for home assistance is due to the pts cognitive and behavior issues and she understands a referral for palliative assistance for this particular reason would need to come from her PCP.  Lisa [name] states they still plan to present for pts 3/8 appt and will let us know at that time what their wishes are but likely will not move forward with the infusions. No further assistance was needed at this time.

## 2020-06-01 NOTE — Telephone Encounter (Signed)
Late entry. Called patient's granddaughter back after clinic yesterday. We talked about her grandmother's situation. She thanked me for the call.  I appreciate everyone's help. The patient's functional status has declined and they're considering about stopping therapy with oncology. She has been diuresed but is now off Lasix and potassium. She has been more anxious and confused recently. Her family wants to keep her at home and avoid placing her in a facility. We talked about options. We talked about getting a home health referral versus palliative care versus hospice. We agreed that getting set up with palliative care likely makes sense and it would be reasonable to try low-dose of hydroxyzine in the meantime as needed for anxiety with sedation caution. I put in the referral to palliative care and sent the prescription.  I explained to her granddaughter that unfortunately I am going to be out of town next week. Dr. Diona Browner is scheduled to see the patient for virtual visit. I greatly appreciate Dr. Rometta Emery help and we talked about the patient's situation before I left clinic. I also will route this note to Dr. Julien Nordmann to make sure he is in the loop.  Orders are listed already to recheck her creatinine, given her recent elevation. She is not yet scheduled to have labs drawn in our clinic. This could be done through oncology. The other option is a drive up visit to get labs collected at [facility], if needed. I did not yet make arrangements for labs at [facility].  I thank all involved.

## 2020-06-03 ENCOUNTER — Telehealth: Payer: Self-pay

## 2020-06-03 NOTE — Telephone Encounter (Signed)
Attempted to contact patient's grand daughter [name] to schedule a Palliative Care consult appointment. No answer left a message to return call.

## 2020-06-03 NOTE — Telephone Encounter (Signed)
Noted  

## 2020-06-04 ENCOUNTER — Telehealth: Payer: Self-pay

## 2020-06-04 ENCOUNTER — Encounter: Payer: Self-pay | Admitting: Family Medicine

## 2020-06-04 ENCOUNTER — Inpatient Hospital Stay: Payer: Medicare Other

## 2020-06-04 ENCOUNTER — Telehealth (INDEPENDENT_AMBULATORY_CARE_PROVIDER_SITE_OTHER): Payer: Medicare Other | Admitting: Family Medicine

## 2020-06-04 ENCOUNTER — Inpatient Hospital Stay: Payer: Medicare Other | Attending: Internal Medicine

## 2020-06-04 ENCOUNTER — Inpatient Hospital Stay (HOSPITAL_BASED_OUTPATIENT_CLINIC_OR_DEPARTMENT_OTHER): Payer: Medicare Other | Admitting: Physician Assistant

## 2020-06-04 ENCOUNTER — Other Ambulatory Visit: Payer: Self-pay

## 2020-06-04 VITALS — Ht 67.0 in | Wt 148.0 lb

## 2020-06-04 VITALS — BP 97/56 | HR 87 | Temp 97.3°F | Resp 17 | Ht 67.0 in | Wt 144.1 lb

## 2020-06-04 DIAGNOSIS — E785 Hyperlipidemia, unspecified: Secondary | ICD-10-CM | POA: Diagnosis not present

## 2020-06-04 DIAGNOSIS — D649 Anemia, unspecified: Secondary | ICD-10-CM

## 2020-06-04 DIAGNOSIS — Z7189 Other specified counseling: Secondary | ICD-10-CM | POA: Diagnosis not present

## 2020-06-04 DIAGNOSIS — E039 Hypothyroidism, unspecified: Secondary | ICD-10-CM | POA: Insufficient documentation

## 2020-06-04 DIAGNOSIS — I10 Essential (primary) hypertension: Secondary | ICD-10-CM | POA: Diagnosis not present

## 2020-06-04 DIAGNOSIS — R531 Weakness: Secondary | ICD-10-CM | POA: Insufficient documentation

## 2020-06-04 DIAGNOSIS — R4182 Altered mental status, unspecified: Secondary | ICD-10-CM | POA: Diagnosis not present

## 2020-06-04 DIAGNOSIS — C3491 Malignant neoplasm of unspecified part of right bronchus or lung: Secondary | ICD-10-CM

## 2020-06-04 DIAGNOSIS — I5020 Unspecified systolic (congestive) heart failure: Secondary | ICD-10-CM | POA: Diagnosis not present

## 2020-06-04 DIAGNOSIS — C342 Malignant neoplasm of middle lobe, bronchus or lung: Secondary | ICD-10-CM | POA: Insufficient documentation

## 2020-06-04 DIAGNOSIS — R413 Other amnesia: Secondary | ICD-10-CM

## 2020-06-04 DIAGNOSIS — Z95828 Presence of other vascular implants and grafts: Secondary | ICD-10-CM

## 2020-06-04 DIAGNOSIS — Z79899 Other long term (current) drug therapy: Secondary | ICD-10-CM | POA: Diagnosis not present

## 2020-06-04 DIAGNOSIS — R600 Localized edema: Secondary | ICD-10-CM | POA: Insufficient documentation

## 2020-06-04 DIAGNOSIS — Z923 Personal history of irradiation: Secondary | ICD-10-CM | POA: Insufficient documentation

## 2020-06-04 DIAGNOSIS — R451 Restlessness and agitation: Secondary | ICD-10-CM

## 2020-06-04 DIAGNOSIS — Z9221 Personal history of antineoplastic chemotherapy: Secondary | ICD-10-CM | POA: Diagnosis not present

## 2020-06-04 DIAGNOSIS — R5383 Other fatigue: Secondary | ICD-10-CM | POA: Insufficient documentation

## 2020-06-04 DIAGNOSIS — Z8616 Personal history of COVID-19: Secondary | ICD-10-CM | POA: Insufficient documentation

## 2020-06-04 DIAGNOSIS — Z9225 Personal history of immunosupression therapy: Secondary | ICD-10-CM | POA: Insufficient documentation

## 2020-06-04 LAB — CBC WITH DIFFERENTIAL (CANCER CENTER ONLY)
Abs Immature Granulocytes: 0.07 10*3/uL (ref 0.00–0.07)
Basophils Absolute: 0 10*3/uL (ref 0.0–0.1)
Basophils Relative: 0 %
Eosinophils Absolute: 0 10*3/uL (ref 0.0–0.5)
Eosinophils Relative: 0 %
HCT: 27.4 % — ABNORMAL LOW (ref 36.0–46.0)
Hemoglobin: 8 g/dL — ABNORMAL LOW (ref 12.0–15.0)
Immature Granulocytes: 1 %
Lymphocytes Relative: 5 %
Lymphs Abs: 0.6 10*3/uL — ABNORMAL LOW (ref 0.7–4.0)
MCH: 26.9 pg (ref 26.0–34.0)
MCHC: 29.2 g/dL — ABNORMAL LOW (ref 30.0–36.0)
MCV: 92.3 fL (ref 80.0–100.0)
Monocytes Absolute: 0.5 10*3/uL (ref 0.1–1.0)
Monocytes Relative: 4 %
Neutro Abs: 10.6 10*3/uL — ABNORMAL HIGH (ref 1.7–7.7)
Neutrophils Relative %: 90 %
Platelet Count: 211 10*3/uL (ref 150–400)
RBC: 2.97 MIL/uL — ABNORMAL LOW (ref 3.87–5.11)
RDW: 15.9 % — ABNORMAL HIGH (ref 11.5–15.5)
WBC Count: 11.8 10*3/uL — ABNORMAL HIGH (ref 4.0–10.5)
nRBC: 0 % (ref 0.0–0.2)

## 2020-06-04 LAB — CMP (CANCER CENTER ONLY)
ALT: 29 U/L (ref 0–44)
AST: 27 U/L (ref 15–41)
Albumin: 2.1 g/dL — ABNORMAL LOW (ref 3.5–5.0)
Alkaline Phosphatase: 119 U/L (ref 38–126)
Anion gap: 9 (ref 5–15)
BUN: 38 mg/dL — ABNORMAL HIGH (ref 8–23)
CO2: 28 mmol/L (ref 22–32)
Calcium: 9 mg/dL (ref 8.9–10.3)
Chloride: 104 mmol/L (ref 98–111)
Creatinine: 1.85 mg/dL — ABNORMAL HIGH (ref 0.44–1.00)
GFR, Estimated: 26 mL/min — ABNORMAL LOW (ref >60)
Glucose, Bld: 115 mg/dL — ABNORMAL HIGH (ref 70–99)
Potassium: 4.4 mmol/L (ref 3.5–5.1)
Sodium: 141 mmol/L (ref 135–145)
Total Bilirubin: 0.4 mg/dL (ref 0.3–1.2)
Total Protein: 6.4 g/dL — ABNORMAL LOW (ref 6.5–8.1)

## 2020-06-04 LAB — TSH: TSH: 3.801 u[IU]/mL (ref 0.308–3.960)

## 2020-06-04 MED ORDER — SODIUM CHLORIDE 0.9% FLUSH
10.0000 mL | Freq: Once | INTRAVENOUS | Status: AC
  Administered 2020-06-04: 10 mL
  Filled 2020-06-04: qty 10

## 2020-06-04 NOTE — Progress Notes (Signed)
R chest PP heparin locked and deaccessed in room per PA request. Pt not receiving treatment today

## 2020-06-04 NOTE — Telephone Encounter (Signed)
Patient's granddaughter Lisa [name] returned my call and left a voicemail. Attempted to contact Lisa [name] to schedule a Palliative Care consult appointment. No answer left a message to return call.

## 2020-06-04 NOTE — Patient Instructions (Signed)
Implanted Port Insertion, Care After This sheet gives you information about how to care for yourself after your procedure. Your health care provider may also give you more specific instructions. If you have problems or questions, contact your health care provider. What can I expect after the procedure? After the procedure, it is common to have:  Discomfort at the port insertion site.  Bruising on the skin over the port. This should improve over 3-4 days. Follow these instructions at home: Port care  After your port is placed, you will get a manufacturer's information card. The card has information about your port. Keep this card with you at all times.  Take care of the port as told by your health care provider. Ask your health care provider if you or a family member can get training for taking care of the port at home. A home health care nurse may also take care of the port.  Make sure to remember what type of port you have. Incision care  Follow instructions from your health care provider about how to take care of your port insertion site. Make sure you: ? Wash your hands with soap and water before and after you change your bandage (dressing). If soap and water are not available, use hand sanitizer. ? Change your dressing as told by your health care provider. ? Leave stitches (sutures), skin glue, or adhesive strips in place. These skin closures may need to stay in place for 2 weeks or longer. If adhesive strip edges start to loosen and curl up, you may trim the loose edges. Do not remove adhesive strips completely unless your health care provider tells you to do that.  Check your port insertion site every day for signs of infection. Check for: ? Redness, swelling, or pain. ? Fluid or blood. ? Warmth. ? Pus or a bad smell.      Activity  Return to your normal activities as told by your health care provider. Ask your health care provider what activities are safe for you.  Do not  lift anything that is heavier than 10 lb (4.5 kg), or the limit that you are told, until your health care provider says that it is safe. General instructions  Take over-the-counter and prescription medicines only as told by your health care provider.  Do not take baths, swim, or use a hot tub until your health care provider approves. Ask your health care provider if you may take showers. You may only be allowed to take sponge baths.  Do not drive for 24 hours if you were given a sedative during your procedure.  Wear a medical alert bracelet in case of an emergency. This will tell any health care providers that you have a port.  Keep all follow-up visits as told by your health care provider. This is important. Contact a health care provider if:  You cannot flush your port with saline as directed, or you cannot draw blood from the port.  You have a fever or chills.  You have redness, swelling, or pain around your port insertion site.  You have fluid or blood coming from your port insertion site.  Your port insertion site feels warm to the touch.  You have pus or a bad smell coming from the port insertion site. Get help right away if:  You have chest pain or shortness of breath.  You have bleeding from your port that you cannot control. Summary  Take care of the port as told by your   health care provider. Keep the manufacturer's information card with you at all times.  Change your dressing as told by your health care provider.  Contact a health care provider if you have a fever or chills or if you have redness, swelling, or pain around your port insertion site.  Keep all follow-up visits as told by your health care provider. This information is not intended to replace advice given to you by your health care provider. Make sure you discuss any questions you have with your health care provider. Document Revised: 10/12/2017 Document Reviewed: 10/12/2017 Elsevier Patient Education   2021 Elsevier Inc.  

## 2020-06-04 NOTE — Progress Notes (Signed)
VIRTUAL VISIT Due to national recommendations of social distancing due to [place] 19, a virtual visit is felt to be most appropriate for this patient at this time.   I connected with the patient on 06/04/20 at 10:20 AM EST by virtual telehealth platform and verified that I am speaking with the correct person using two identifiers.   I discussed the limitations, risks, security and privacy concerns of performing an evaluation and management service by  virtual telehealth platform and the availability of in person appointments. I also discussed with the patient that there may be a patient responsible charge related to this service. The patient expressed understanding and agreed to proceed.  Patient location: Home Provider Location: [facility] [place] Participants: Eliezer Lofts and [name]   Chief Complaint  Patient presents with  . Memory Loss  . Insomnia  . Behavior Issue    History of Present Illness: 87 year old female patient of Dr. Josefine Class with history of non-small cell lung carcinoma presents with her family (grandaughter [name]) to discuss her recent decline.  She has an appt with Dr. Julien Nordmann today to discuss possible termination of cancer treatment and move to comfort care.   She has recently been very fluid overloaded.  She was SOB, peripheral swelling. Cardiology has recently treated her hx of CHF, lower extremity edema and weight gain with lasix/potassium x 10 days Feb 11-21st.. She has had significant improvement in breathing, lost 25 lbs.  Has labs schedule to re-eval kidney function, electrolytes. Now, She has gained 12 lbs back since stopping lasix.  Cr 1.33--2.32.Marland Kitchen  In the last month, she has been more confused and anxious. Her behavior is erratic. She is having trouble sleeping at night.  She started having issues with memory when chemo started.   Living with elderly husband. She needs help at home.  Her PCP Dr. Damita Dunnings spoke to family on 05/30/2020 and started  low dose atarax to help with anxiety prn.  Atarax seems to help a small amount but not much. Referral to palliative care set up.    COVID 19 screen No recent travel or known exposure to COVID19 The patient denies respiratory symptoms of COVID 19 at this time.  The importance of social distancing was discussed today.   Review of Systems  Constitutional: Positive for malaise/fatigue and weight loss. Negative for chills and fever.  HENT: Negative for congestion and ear pain.   Eyes: Negative for pain and redness.  Respiratory: Positive for shortness of breath.   Cardiovascular: Positive for leg swelling. Negative for chest pain and palpitations.  Gastrointestinal: Negative for abdominal pain, blood in stool, constipation, diarrhea, nausea and vomiting.  Genitourinary: Negative for dysuria.  Musculoskeletal: Negative for falls and myalgias.  Skin: Negative for rash.  Neurological: Negative for dizziness.  Psychiatric/Behavioral: Negative for depression. The patient is not nervous/anxious.       Past Medical History:  Diagnosis Date  . Diverticulosis   . Headache   . Hemorrhoids    internal and external  . Hyperlipidemia 01/29/1991  . Hypertension 10/29/1995  . Hypothyroidism 10/29/1995  . Lung mass    right middle lobe  . Melanoma ([place])    h/o, local excision. no chemo or rady.   . Skin cancer (melanoma) ([place])   . Tubular adenoma of colon   . Wears dentures     reports that she quit smoking about 31 years ago. Her smoking use included cigarettes. She started smoking about 61 years ago. She has a 30.00  pack-year smoking history. She has never used smokeless tobacco. She reports that she does not drink alcohol and does not use drugs.   Current Outpatient Medications:  .  albuterol (VENTOLIN HFA) 108 (90 Base) MCG/ACT inhaler, Inhale 2 puffs into the lungs every 6 (six) hours as needed for wheezing or shortness of breath., Disp: 6.7 g, Rfl: 0 .  amLODipine (NORVASC) 10 MG  tablet, Take 1 tablet (10 mg total) by mouth daily. Will need office visit with PCP for future refills., Disp: 90 tablet, Rfl: 0 .  Cholecalciferol (VITAMIN D3) 50 MCG (2000 UT) TABS, Take 2,000 Units by mouth 3 (three) times a week., Disp: , Rfl:  .  Coenzyme Q10 100 MG capsule, Take 100 mg by mouth daily. , Disp: , Rfl:  .  docusate sodium (COLACE) 100 MG capsule, Take 100 mg by mouth daily as needed for mild constipation., Disp: , Rfl:  .  fluticasone (FLONASE) 50 MCG/ACT nasal spray, USE 2 SPRAYS IN EACH NOSTRIL DAILY AS NEEDED FOR ALLERGIES, Disp: 48 g, Rfl: 3 .  hydrOXYzine (ATARAX/VISTARIL) 10 MG tablet, Take 0.5-1 tablets (5-10 mg total) by mouth 3 (three) times daily as needed for anxiety., Disp: 30 tablet, Rfl: 1 .  levothyroxine (SYNTHROID) 75 MCG tablet, Take 1 tablet by mouth once daily, Disp: 90 tablet, Rfl: 1 .  loratadine (CLARITIN) 10 MG tablet, Take 1 tablet (10 mg total) by mouth daily as needed for allergies or rhinitis., Disp: , Rfl:  .  mirtazapine (REMERON) 7.5 MG tablet, TAKE 1 TABLET BY MOUTH ONCE DAILY AT BEDTIME, Disp: 30 tablet, Rfl: 0 .  Multiple Vitamin (MULTIVITAMIN WITH MINERALS) TABS tablet, Take 1 tablet by mouth daily., Disp:  , Rfl:  .  sennosides-docusate sodium (SENOKOT-S) 8.6-50 MG tablet, Take 1 tablet by mouth daily as needed for constipation., Disp: , Rfl:  No current facility-administered medications for this visit.  Facility-Administered Medications Ordered in Other Visits:  .  acetaminophen (TYLENOL) tablet 650 mg, 650 mg, Oral, Once, Gudena, Vinay, MD   Observations/Objective: Height 5\' 7"  (1.702 m), weight 148 lb (67.1 kg).  Physical Exam  Physical Exam Constitutional:      General: The patient is not in acute distress. Pulmonary:     Effort: Pulmonary effort is normal. No respiratory distress.  Neurological:     Mental Status: The patient is alert and oriented to person, place, and time.  Psychiatric:        Mood and Affect: Mood normal.         Behavior: Behavior normal.   Assessment and Plan    Problem List Items Addressed This Visit    Agitation    Using atarax prn  For agitation.. pallitive care witll like be able to help with this as well.      HFrEF (heart failure with reduced ejection fraction) (HCC)    Currently fluid overloaded. Encouraged caregivers to restart lasix at previous dose.      Hypothyroidism   Non-small cell carcinoma of lung, stage 4, right ([place]) - Primary   Stage III squamous cell carcinoma of right lung [facility])    Reviewed medical records in detail as pt unknown to me.  PCP has made referral for  palliative care... hospice referral may be more appropriate at this time. Family will discuss with Oncology at upcoming [place].     Considering termination of care.. has upcoming OV with Oncologist       Other Visit Diagnoses    Memory loss  I discussed the assessment and treatment plan with the patient. The patient was provided an opportunity to ask questions and all were answered. The patient agreed with the plan and demonstrated an understanding of the instructions.   The patient was advised to call back or seek an in-person evaluation if the symptoms worsen or if the condition fails to improve as anticipated.     Eliezer Lofts, MD

## 2020-06-04 NOTE — Telephone Encounter (Signed)
I have called in the hospice referral to [facility]. I spoke with Roderic Ovens who advised they will call with confirmation of date pt is scheduled for. She also advised they can pull the referral from Epic as well. No further action was needed at this time.

## 2020-06-04 NOTE — Telephone Encounter (Signed)
Dr. Julien Nordmann will be the attending.

## 2020-06-05 ENCOUNTER — Telehealth: Payer: Self-pay | Admitting: General Practice

## 2020-06-05 ENCOUNTER — Telehealth: Payer: Self-pay | Admitting: Internal Medicine

## 2020-06-05 DIAGNOSIS — R609 Edema, unspecified: Secondary | ICD-10-CM

## 2020-06-05 DIAGNOSIS — R600 Localized edema: Secondary | ICD-10-CM

## 2020-06-05 MED ORDER — POTASSIUM CHLORIDE CRYS ER 20 MEQ PO TBCR: 20.0000 meq | EXTENDED_RELEASE_TABLET | Freq: Every day | ORAL | 0 refills | Status: AC

## 2020-06-05 MED ORDER — FUROSEMIDE 40 MG PO TABS: 40.0000 mg | ORAL_TABLET | Freq: Every day | ORAL | 0 refills | Status: AC

## 2020-06-05 NOTE — Telephone Encounter (Signed)
Left message with upcoming appointment per 3/8 schedule message. Gave option to call back to reschedule if needed.

## 2020-06-05 NOTE — Telephone Encounter (Signed)
Contacted by patient's granddaughter Lisa [name] who indicated that her grandmother is becoming more short of breath and has increased lower extremity swelling.  She reports she is beginning to decline and they are working on consultation with palliative/hospice.  We will prescribe 40 mg of Lasix daily with 20 mEq of potassium and defer to palliative/hospice care for further treatment and recommendations.

## 2020-06-07 ENCOUNTER — Inpatient Hospital Stay: Payer: Medicare Other

## 2020-06-07 ENCOUNTER — Other Ambulatory Visit: Payer: Self-pay

## 2020-06-07 DIAGNOSIS — I5020 Unspecified systolic (congestive) heart failure: Secondary | ICD-10-CM

## 2020-06-07 DIAGNOSIS — D649 Anemia, unspecified: Secondary | ICD-10-CM

## 2020-06-07 DIAGNOSIS — C342 Malignant neoplasm of middle lobe, bronchus or lung: Secondary | ICD-10-CM | POA: Diagnosis not present

## 2020-06-07 LAB — PREPARE RBC (CROSSMATCH)

## 2020-06-07 MED ORDER — SODIUM CHLORIDE 0.9% FLUSH
10.0000 mL | INTRAVENOUS | Status: DC | PRN
  Filled 2020-06-07: qty 10

## 2020-06-07 MED ORDER — SODIUM CHLORIDE 0.9% IV SOLUTION
250.0000 mL | Freq: Once | INTRAVENOUS | Status: DC
  Filled 2020-06-07: qty 250

## 2020-06-07 MED ORDER — HEPARIN SOD (PORK) LOCK FLUSH 100 UNIT/ML IV SOLN
500.0000 [IU] | Freq: Every day | INTRAVENOUS | Status: DC | PRN
  Filled 2020-06-07: qty 5

## 2020-06-07 MED ORDER — FUROSEMIDE 10 MG/ML IJ SOLN: 20.0000 mg | Freq: Once | INTRAMUSCULAR | Status: DC

## 2020-06-07 MED ORDER — ACETAMINOPHEN 325 MG PO TABS
650.0000 mg | ORAL_TABLET | Freq: Once | ORAL | Status: AC
  Administered 2020-06-07: 650 mg via ORAL

## 2020-06-07 MED ORDER — ACETAMINOPHEN 325 MG PO TABS
ORAL_TABLET | ORAL | Status: AC
  Filled 2020-06-07: qty 2

## 2020-06-07 NOTE — Patient Instructions (Signed)

## 2020-06-08 ENCOUNTER — Encounter: Payer: Self-pay | Admitting: Internal Medicine

## 2020-06-08 LAB — BPAM RBC
Blood Product Expiration Date: 202204142359
ISSUE DATE / TIME: 202203111059
Unit Type and Rh: 9500

## 2020-06-08 LAB — TYPE AND SCREEN
ABO/RH(D): O NEG
Antibody Screen: NEGATIVE
Unit division: 0

## 2020-06-14 ENCOUNTER — Emergency Department (HOSPITAL_COMMUNITY)

## 2020-06-14 ENCOUNTER — Inpatient Hospital Stay (HOSPITAL_COMMUNITY)
Admission: EM | Admit: 2020-06-14 | Discharge: 2020-06-28 | DRG: 951 | Disposition: E | Attending: Internal Medicine | Admitting: Internal Medicine

## 2020-06-14 ENCOUNTER — Other Ambulatory Visit: Payer: Self-pay

## 2020-06-14 DIAGNOSIS — Z79899 Other long term (current) drug therapy: Secondary | ICD-10-CM

## 2020-06-14 DIAGNOSIS — Z66 Do not resuscitate: Secondary | ICD-10-CM | POA: Diagnosis present

## 2020-06-14 DIAGNOSIS — Z86718 Personal history of other venous thrombosis and embolism: Secondary | ICD-10-CM

## 2020-06-14 DIAGNOSIS — M9702XA Periprosthetic fracture around internal prosthetic left hip joint, initial encounter: Secondary | ICD-10-CM | POA: Diagnosis present

## 2020-06-14 DIAGNOSIS — Z515 Encounter for palliative care: Secondary | ICD-10-CM | POA: Diagnosis present

## 2020-06-14 DIAGNOSIS — F0390 Unspecified dementia without behavioral disturbance: Secondary | ICD-10-CM | POA: Diagnosis present

## 2020-06-14 DIAGNOSIS — I5020 Unspecified systolic (congestive) heart failure: Secondary | ICD-10-CM | POA: Diagnosis not present

## 2020-06-14 DIAGNOSIS — Y92003 Bedroom of unspecified non-institutional (private) residence as the place of occurrence of the external cause: Secondary | ICD-10-CM

## 2020-06-14 DIAGNOSIS — D649 Anemia, unspecified: Secondary | ICD-10-CM | POA: Diagnosis not present

## 2020-06-14 DIAGNOSIS — N179 Acute kidney failure, unspecified: Secondary | ICD-10-CM | POA: Diagnosis present

## 2020-06-14 DIAGNOSIS — D72828 Other elevated white blood cell count: Secondary | ICD-10-CM | POA: Diagnosis present

## 2020-06-14 DIAGNOSIS — E039 Hypothyroidism, unspecified: Secondary | ICD-10-CM | POA: Diagnosis present

## 2020-06-14 DIAGNOSIS — C3491 Malignant neoplasm of unspecified part of right bronchus or lung: Secondary | ICD-10-CM | POA: Diagnosis present

## 2020-06-14 DIAGNOSIS — N189 Chronic kidney disease, unspecified: Secondary | ICD-10-CM | POA: Diagnosis not present

## 2020-06-14 DIAGNOSIS — F419 Anxiety disorder, unspecified: Secondary | ICD-10-CM | POA: Diagnosis present

## 2020-06-14 DIAGNOSIS — Z85820 Personal history of malignant melanoma of skin: Secondary | ICD-10-CM

## 2020-06-14 DIAGNOSIS — W19XXXA Unspecified fall, initial encounter: Secondary | ICD-10-CM | POA: Diagnosis present

## 2020-06-14 DIAGNOSIS — Z87891 Personal history of nicotine dependence: Secondary | ICD-10-CM

## 2020-06-14 DIAGNOSIS — S72002D Fracture of unspecified part of neck of left femur, subsequent encounter for closed fracture with routine healing: Secondary | ICD-10-CM | POA: Diagnosis not present

## 2020-06-14 DIAGNOSIS — I959 Hypotension, unspecified: Secondary | ICD-10-CM | POA: Diagnosis present

## 2020-06-14 DIAGNOSIS — I5043 Acute on chronic combined systolic (congestive) and diastolic (congestive) heart failure: Secondary | ICD-10-CM | POA: Diagnosis present

## 2020-06-14 DIAGNOSIS — S72002A Fracture of unspecified part of neck of left femur, initial encounter for closed fracture: Secondary | ICD-10-CM | POA: Diagnosis present

## 2020-06-14 DIAGNOSIS — J9601 Acute respiratory failure with hypoxia: Secondary | ICD-10-CM | POA: Diagnosis not present

## 2020-06-14 DIAGNOSIS — W06XXXA Fall from bed, initial encounter: Secondary | ICD-10-CM | POA: Diagnosis present

## 2020-06-14 DIAGNOSIS — Z9221 Personal history of antineoplastic chemotherapy: Secondary | ICD-10-CM | POA: Diagnosis not present

## 2020-06-14 DIAGNOSIS — Z789 Other specified health status: Secondary | ICD-10-CM | POA: Diagnosis not present

## 2020-06-14 DIAGNOSIS — J910 Malignant pleural effusion: Secondary | ICD-10-CM | POA: Diagnosis present

## 2020-06-14 DIAGNOSIS — S72112A Displaced fracture of greater trochanter of left femur, initial encounter for closed fracture: Secondary | ICD-10-CM | POA: Diagnosis present

## 2020-06-14 DIAGNOSIS — E785 Hyperlipidemia, unspecified: Secondary | ICD-10-CM | POA: Diagnosis present

## 2020-06-14 DIAGNOSIS — R52 Pain, unspecified: Secondary | ICD-10-CM

## 2020-06-14 DIAGNOSIS — Z79890 Hormone replacement therapy (postmenopausal): Secondary | ICD-10-CM | POA: Diagnosis not present

## 2020-06-14 DIAGNOSIS — I130 Hypertensive heart and chronic kidney disease with heart failure and stage 1 through stage 4 chronic kidney disease, or unspecified chronic kidney disease: Secondary | ICD-10-CM | POA: Diagnosis present

## 2020-06-14 DIAGNOSIS — N184 Chronic kidney disease, stage 4 (severe): Secondary | ICD-10-CM | POA: Diagnosis present

## 2020-06-14 DIAGNOSIS — Z923 Personal history of irradiation: Secondary | ICD-10-CM | POA: Diagnosis not present

## 2020-06-14 DIAGNOSIS — Z7189 Other specified counseling: Secondary | ICD-10-CM | POA: Diagnosis not present

## 2020-06-14 DIAGNOSIS — D6489 Other specified anemias: Secondary | ICD-10-CM | POA: Diagnosis present

## 2020-06-14 LAB — CBC WITH DIFFERENTIAL/PLATELET
Abs Immature Granulocytes: 0.27 10*3/uL — ABNORMAL HIGH (ref 0.00–0.07)
Basophils Absolute: 0 10*3/uL (ref 0.0–0.1)
Basophils Relative: 0 %
Eosinophils Absolute: 0 10*3/uL (ref 0.0–0.5)
Eosinophils Relative: 0 %
HCT: 29.5 % — ABNORMAL LOW (ref 36.0–46.0)
Hemoglobin: 8.4 g/dL — ABNORMAL LOW (ref 12.0–15.0)
Immature Granulocytes: 2 %
Lymphocytes Relative: 3 %
Lymphs Abs: 0.5 10*3/uL — ABNORMAL LOW (ref 0.7–4.0)
MCH: 27.1 pg (ref 26.0–34.0)
MCHC: 28.5 g/dL — ABNORMAL LOW (ref 30.0–36.0)
MCV: 95.2 fL (ref 80.0–100.0)
Monocytes Absolute: 0.5 10*3/uL (ref 0.1–1.0)
Monocytes Relative: 4 %
Neutro Abs: 13.7 10*3/uL — ABNORMAL HIGH (ref 1.7–7.7)
Neutrophils Relative %: 91 %
Platelets: 215 10*3/uL (ref 150–400)
RBC: 3.1 MIL/uL — ABNORMAL LOW (ref 3.87–5.11)
RDW: 15.7 % — ABNORMAL HIGH (ref 11.5–15.5)
WBC: 15 10*3/uL — ABNORMAL HIGH (ref 4.0–10.5)
nRBC: 0 % (ref 0.0–0.2)

## 2020-06-14 LAB — COMPREHENSIVE METABOLIC PANEL
ALT: 24 U/L (ref 0–44)
AST: 22 U/L (ref 15–41)
Albumin: 1.7 g/dL — ABNORMAL LOW (ref 3.5–5.0)
Alkaline Phosphatase: 91 U/L (ref 38–126)
Anion gap: 8 (ref 5–15)
BUN: 35 mg/dL — ABNORMAL HIGH (ref 8–23)
CO2: 31 mmol/L (ref 22–32)
Calcium: 8.6 mg/dL — ABNORMAL LOW (ref 8.9–10.3)
Chloride: 99 mmol/L (ref 98–111)
Creatinine, Ser: 2.04 mg/dL — ABNORMAL HIGH (ref 0.44–1.00)
GFR, Estimated: 23 mL/min — ABNORMAL LOW (ref >60)
Glucose, Bld: 117 mg/dL — ABNORMAL HIGH (ref 70–99)
Potassium: 3.8 mmol/L (ref 3.5–5.1)
Sodium: 138 mmol/L (ref 135–145)
Total Bilirubin: 1.2 mg/dL (ref 0.3–1.2)
Total Protein: 5.5 g/dL — ABNORMAL LOW (ref 6.5–8.1)

## 2020-06-14 MED ORDER — GLYCOPYRROLATE 1 MG PO TABS
1.0000 mg | ORAL_TABLET | ORAL | Status: DC | PRN
Start: 2020-06-14 — End: 2020-06-15
  Filled 2020-06-14: qty 1

## 2020-06-14 MED ORDER — HALOPERIDOL LACTATE 5 MG/ML IJ SOLN: 0.5000 mg | INTRAMUSCULAR | Status: DC | PRN

## 2020-06-14 MED ORDER — LORAZEPAM 2 MG/ML IJ SOLN: 1.0000 mg | INTRAMUSCULAR | Status: DC | PRN

## 2020-06-14 MED ORDER — LACTATED RINGERS IV BOLUS
1000.0000 mL | Freq: Once | INTRAVENOUS | Status: AC
  Administered 2020-06-14: 1000 mL via INTRAVENOUS

## 2020-06-14 MED ORDER — GLYCOPYRROLATE 0.2 MG/ML IJ SOLN: 0.2000 mg | INTRAMUSCULAR | Status: DC | PRN

## 2020-06-14 MED ORDER — MORPHINE SULFATE (PF) 2 MG/ML IV SOLN
1.0000 mg | INTRAVENOUS | Status: DC | PRN
  Administered 2020-06-14: 1 mg via INTRAVENOUS
  Filled 2020-06-14: qty 1

## 2020-06-14 MED ORDER — GLYCOPYRROLATE 0.2 MG/ML IJ SOLN
0.2000 mg | INTRAMUSCULAR | Status: DC | PRN
  Administered 2020-06-15: 0.2 mg via INTRAVENOUS
  Filled 2020-06-14 (×2): qty 1

## 2020-06-14 MED ORDER — LORAZEPAM 1 MG PO TABS: 1.0000 mg | ORAL_TABLET | ORAL | Status: DC | PRN

## 2020-06-14 MED ORDER — HALOPERIDOL LACTATE 2 MG/ML PO CONC
0.5000 mg | ORAL | Status: DC | PRN
Start: 2020-06-14 — End: 2020-06-15
  Filled 2020-06-14: qty 0.3

## 2020-06-14 MED ORDER — POLYVINYL ALCOHOL 1.4 % OP SOLN
1.0000 [drp] | Freq: Four times a day (QID) | OPHTHALMIC | Status: DC | PRN
  Filled 2020-06-14: qty 15

## 2020-06-14 MED ORDER — MORPHINE SULFATE (PF) 2 MG/ML IV SOLN
1.0000 mg | INTRAVENOUS | Status: DC | PRN
  Administered 2020-06-15: 1 mg via INTRAVENOUS
  Filled 2020-06-14 (×2): qty 1

## 2020-06-14 MED ORDER — ONDANSETRON HCL 4 MG/2ML IJ SOLN: 4.0000 mg | Freq: Four times a day (QID) | INTRAMUSCULAR | Status: DC | PRN

## 2020-06-14 MED ORDER — BIOTENE DRY MOUTH MT LIQD
15.0000 mL | Freq: Two times a day (BID) | OROMUCOSAL | Status: DC
  Administered 2020-06-14: 15 mL via TOPICAL

## 2020-06-14 MED ORDER — MORPHINE SULFATE (PF) 4 MG/ML IV SOLN
4.0000 mg | INTRAVENOUS | Status: DC | PRN
Start: 2020-06-14 — End: 2020-06-14
  Administered 2020-06-14: 4 mg via INTRAVENOUS
  Filled 2020-06-14: qty 1

## 2020-06-14 MED ORDER — LORAZEPAM 2 MG/ML IJ SOLN
1.0000 mg | INTRAMUSCULAR | Status: DC | PRN
  Administered 2020-06-14: 1 mg via INTRAVENOUS
  Filled 2020-06-14: qty 1

## 2020-06-14 MED ORDER — NALOXONE HCL 0.4 MG/ML IJ SOLN
INTRAMUSCULAR | Status: AC
  Filled 2020-06-14: qty 1

## 2020-06-14 MED ORDER — HALOPERIDOL 0.5 MG PO TABS
0.5000 mg | ORAL_TABLET | ORAL | Status: DC | PRN
Start: 2020-06-14 — End: 2020-06-15
  Filled 2020-06-14: qty 1

## 2020-06-14 MED ORDER — FENTANYL CITRATE (PF) 100 MCG/2ML IJ SOLN
50.0000 ug | Freq: Once | INTRAMUSCULAR | Status: AC
Start: 2020-06-14 — End: 2020-06-14
  Administered 2020-06-14: 50 ug via INTRAVENOUS
  Filled 2020-06-14: qty 2

## 2020-06-14 MED ORDER — BIOTENE DRY MOUTH MT LIQD: 15.0000 mL | OROMUCOSAL | Status: DC | PRN

## 2020-06-14 MED ORDER — ONDANSETRON 4 MG PO TBDP: 4.0000 mg | ORAL_TABLET | Freq: Four times a day (QID) | ORAL | Status: DC | PRN

## 2020-06-14 MED ORDER — DIPHENHYDRAMINE HCL 50 MG/ML IJ SOLN
12.5000 mg | INTRAMUSCULAR | Status: DC | PRN
Start: 2020-06-14 — End: 2020-06-15

## 2020-06-14 MED ORDER — ACETAMINOPHEN 650 MG RE SUPP: 650.0000 mg | Freq: Four times a day (QID) | RECTAL | Status: DC | PRN

## 2020-06-14 MED ORDER — ALBUTEROL SULFATE (2.5 MG/3ML) 0.083% IN NEBU: 2.5000 mg | INHALATION_SOLUTION | RESPIRATORY_TRACT | Status: DC | PRN

## 2020-06-14 MED ORDER — MORPHINE SULFATE (PF) 2 MG/ML IV SOLN: 1.0000 mg | INTRAVENOUS | Status: DC | PRN

## 2020-06-14 MED ORDER — ACETAMINOPHEN 325 MG PO TABS: 650.0000 mg | ORAL_TABLET | Freq: Four times a day (QID) | ORAL | Status: DC | PRN

## 2020-06-14 MED ORDER — LORAZEPAM 2 MG/ML PO CONC: 1.0000 mg | ORAL | Status: DC | PRN

## 2020-06-14 NOTE — Discharge Planning (Signed)
RNCM following for disposition needs. Pt likely to be admitted to [facility] ([facility]  Tamer Baughman J. Clydene Laming, [place], [place], [place]

## 2020-06-14 NOTE — ED Provider Notes (Signed)
Patient is in hospice care.  She has had a fall with need for pain control.  Family is not able to continue home care for the patient.  At this time, [facility] does not have a space available at their residential end-of-life care facilities.  They are requesting patient be admitted to the hospital as a hospice patient for pain management. Physical Exam  BP (!) 91/55 (BP Location: Right Arm)   Pulse 75   Temp 98 F (36.7 C) (Oral)   Resp (!) 22   SpO2 99%   Physical Exam  ED Course/Procedures     Procedures  MDM  Consult: Dr.Smith for admission.  Patient has been somewhat confused and required morphine for pain control.  Patient has been sleeping for a number of hours.  Plan will be for admission to medical service for palliative care and pain control while establishing final placement for hospice end-of-life care.      Charlesetta Shanks, MD 06/25/2020 1419

## 2020-06-14 NOTE — H&P (Addendum)
History and Physical    Lisa [name] WGN:[id number] DOB: November 07, 1932 DOA: 06/23/2020  Referring MD/NP/PA:  Tomasa Rand, MD PCP: [name], MD  Patient coming from: Home via EMS  Chief Complaint: Fall   I have personally briefly reviewed patient's old medical records in [place]   HPI: Lisa [name] is a 87 y.o. female with medical history significant of severe dementia, hypertension, hyperlipidemia, hypothyroidism, systolic congestive heart failure(last EF 40-45% with grade 1 diastolic dysfunction), non-small cell lung cancer diagnosed in 2019 s/p chemoradiation, and s/p left hip hemiarthroplasty in 05/2019 presents after having an unwitnessed fall at home around midnight.  Patient reportedly had been complaining of severe left hip pain.  Family was unable to get the patient up off the floor and called EMS.  She is currently on hospice and has a DNR set in place.  She had stopped chemoradiation treatments for her lung cancer due to progressive decline in her health and not necessarily disease progression.  Patient had fractured her left hip last year around this time requiring surgery.  At this time family requesting admission into the hospital with comfort measures only.  They do not want her placed on morphine drip  at this time while they sort things out.  Plan is for her to get placed in a inpatient hospice facility once a bed becomes available.  Currently there are no beds available at [facility].   ED Course: Upon admission into the emergency department patient was afebrile, pulse 57-87, 68/26-120/52, and O2 saturations reported as low as 81% with with improvement to 97-99% on 4 L of oxygen.  CT scan of the brain showed no acute intercranial process.  Labs significant for WBC 15.5, hemoglobin 8.4, BUN 35,  creatinine 2.4, and albumin 1.7.  Chest x-ray significant for right hilar mass compatible with patient's known lung cancer and a large right pleural effusion increased since  prior study.  X-rays of the hip were suspicious for nondisplaced periprosthetic fracture to the osteopenic greater trochanter abutting the left hip hemiarthroplasty.  Dr. Doreatha Martin of orthopedics was consulted, but injury was thought to be nonoperative.  Hospice liaison confirmed no bed available at this time.  Patient was given fentanyl without improvement symptoms and subsequently had received morphine with improvement in pain.  Will admit for pain control.  Review of Systems  Unable to perform ROS: Dementia  Constitutional: Positive for malaise/fatigue.  Musculoskeletal: Positive for falls and joint pain.    Past Medical History:  Diagnosis Date  . Diverticulosis   . Headache   . Hemorrhoids    internal and external  . Hyperlipidemia 01/29/1991  . Hypertension 10/29/1995  . Hypothyroidism 10/29/1995  . Lung mass    right middle lobe  . Melanoma ([place])    h/o, local excision. no chemo or rady.   . Skin cancer (melanoma) ([place])   . Tubular adenoma of colon   . Wears dentures     Past Surgical History:  Procedure Laterality Date  . BRONCHIAL BIOPSY  11/23/2017   Procedure: BRONCHIAL BIOPSIES;  Surgeon: Grace Isaac, MD;  Location: [facility] OR;  Service: Thoracic;;  . cataract surgery  2007, 2008   repair bilat  . DG BARIUM ENEMA ([place] [facility]) N/A 2016   [name]  . HERNIA REPAIR  04/25/2001  . HIP ARTHROPLASTY Left 05/31/2019   Procedure: LEFT HIP HEMIARTHROPLASTY;  Surgeon: Hiram Gash, MD;  Location: [facility];  Service: Orthopedics;  Laterality: Left;  . IR  IMAGING GUIDED PORT INSERTION  07/05/2018  . IR US GUIDE BX ASP/DRAIN  11/17/2017  . KNEE SURGERY     meniscus tear per Dr. Ronnie Derby, R knee  . MULTIPLE TOOTH EXTRACTIONS    . SEPTOPLASTY  2006   Dr. Ernesto Rutherford  . SKIN CANCER EXCISION    . TONSILLECTOMY  1954  . VAGINAL HYSTERECTOMY    . VIDEO BRONCHOSCOPY WITH ENDOBRONCHIAL ULTRASOUND N/A 11/23/2017   Procedure: VIDEO BRONCHOSCOPY WITH ENDOBRONCHIAL ULTRASOUND;  Surgeon: Grace Isaac, MD;  Location: [facility];  Service: Thoracic;  Laterality: N/A;     reports that she quit smoking about 31 years ago. Her smoking use included cigarettes. She started smoking about 61 years ago. She has a 30.00 pack-year smoking history. She has never used smokeless tobacco. She reports that she does not drink alcohol and does not use drugs.  Allergies  Allergen Reactions  . Atorvastatin Other (See Comments)    CAUSED BURNING AND TINGLING IN LEGS  . Celebrex [Celecoxib] Swelling and Rash    SWELLING REACTION UNSPECIFIED   . Cholecalciferol Anxiety and Other (See Comments)    Nervous, crying with high dose replacement  . Simvastatin Other (See Comments)    Muscle pain  . Tape Other (See Comments)    SKIN IS SENSITIVE!!    Family History  Problem Relation Age of Onset  . Stroke Mother   . Heart disease Mother        CHF  . Stomach cancer Sister   . Cancer Brother        Metastatic cancer  . Colon cancer Neg Hx   . Breast cancer Neg Hx     Prior to Admission medications   Medication Sig Start Date End Date Taking? Authorizing Provider  albuterol (VENTOLIN HFA) 108 (90 Base) MCG/ACT inhaler Inhale 2 puffs into the lungs every 6 (six) hours as needed for wheezing or shortness of breath. 04/08/20  Yes Heilingoetter, Cassandra L, PA-C  Coenzyme Q10 100 MG capsule Take 100 mg by mouth daily.    Yes [provider]  fluticasone (FLONASE) 50 MCG/ACT nasal spray USE 2 SPRAYS IN EACH NOSTRIL DAILY AS NEEDED FOR ALLERGIES 08/02/18  Yes [name], MD  furosemide (LASIX) 40 MG tablet Take 1 tablet (40 mg total) by mouth daily. Patient taking differently: Take 40 mg by mouth at bedtime. 06/05/20 09/03/20 Yes Cleaver, Jossie Ng, NP  ipratropium-albuterol (DUONEB) 0.5-2.5 (3) MG/3ML SOLN Take 3 mLs by nebulization every 6 (six) hours as needed (shortness of breath/wheezing). 06/11/20  Yes [provider]  levothyroxine (SYNTHROID) 75 MCG tablet Take 1 tablet by mouth once  daily 03/20/20  Yes [name], MD  loratadine (CLARITIN) 10 MG tablet Take 1 tablet (10 mg total) by mouth daily as needed for allergies or rhinitis. 08/02/18  Yes [name], MD  LORazepam (ATIVAN) 0.5 MG tablet Take 0.5 mg by mouth at bedtime. 06/11/20  Yes [provider]  mirtazapine (REMERON) 15 MG tablet Take 15 mg by mouth at bedtime. 06/11/20  Yes [provider]  Multiple Vitamin (MULTIVITAMIN WITH MINERALS) TABS tablet Take 1 tablet by mouth daily. 06/03/19  Yes Isaac Bliss, Rayford Halsted, MD  potassium chloride SA (KLOR-CON M20) 20 MEQ tablet Take 1 tablet (20 mEq total) by mouth daily. Patient taking differently: Take 20 mEq by mouth at bedtime. 06/05/20 09/03/20 Yes Cleaver, Jossie Ng, NP  sennosides-docusate sodium (SENOKOT-S) 8.6-50 MG tablet Take 1 tablet by mouth daily.   Yes  [provider]  sertraline (ZOLOFT) 50 MG tablet Take 50 mg by mouth daily. 06/11/20  Yes [provider]  sodium chloride (OCEAN) 0.65 % SOLN nasal spray Place 1 spray into both nostrils as needed for congestion.   Yes [provider]  amLODipine (NORVASC) 10 MG tablet Take 1 tablet (10 mg total) by mouth daily. Will need office visit with PCP for future refills. Patient not taking: No sig reported 05/29/20   [name], MD    Physical Exam:  Constitutional: Elderly female who appears to be respiratory distress Vitals:   06/06/2020 1000 06/17/2020 1100 06/18/2020 1200 06/21/2020 1300  BP: 102/71 (!) 84/47 (!) 95/56 (!) 91/55  Pulse: (!) 56 (!) 38 (!) 39 75  Resp: (!) 21 (!) 9 (!) 24 (!) 22  Temp: 98 F (36.7 C)     TempSrc: Oral     SpO2: 93% 99% 99% 99%   Eyes: PERRL, lids and conjunctivae normal ENMT: Mucous membranes are dry. Posterior pharynx clear of any exudate or lesions.  Neck: normal, supple, no masses, no thyromegaly Respiratory: Tachypneic with facemask off at this time.  Rales noted on the left lung field with decreased aeration right lung  field.  O2 saturations hovering around 83%. Cardiovascular: Regular rate and rhythm, no murmurs / rubs / gallops.  At least trace lower extremity edema extremity edema. 2+ pedal pulses. No carotid bruits.  Abdomen: no tenderness, no masses palpated. No hepatosplenomegaly. Bowel sounds positive.  Musculoskeletal: no clubbing / cyanosis.  Decreased range of motion of hip with tenderness palpation. Skin: no rashes, lesions, ulcers. No induration Neurologic: CN 2-12 grossly intact.  Moving all extremity Psychiatric: Lethargic but oriented at least to self.  Anxious mood.     Labs on Admission: I have personally reviewed following labs and imaging studies  CBC: Recent Labs  Lab 06/23/2020 0141  WBC 15.0*  NEUTROABS 13.7*  HGB 8.4*  HCT 29.5*  MCV 95.2  PLT 621   Basic Metabolic Panel: Recent Labs  Lab 05/30/2020 0141  NA 138  K 3.8  CL 99  CO2 31  GLUCOSE 117*  BUN 35*  CREATININE 2.04*  CALCIUM 8.6*   GFR: Estimated Creatinine Clearance: 19.6 mL/min (A) (by C-G formula based on SCr of 2.04 mg/dL (H)). Liver Function Tests: Recent Labs  Lab 06/04/2020 0141  AST 22  ALT 24  ALKPHOS 91  BILITOT 1.2  PROT 5.5*  ALBUMIN 1.7*   No results for input(s): LIPASE, AMYLASE in the last 168 hours. No results for input(s): AMMONIA in the last 168 hours. Coagulation Profile: No results for input(s): INR, PROTIME in the last 168 hours. Cardiac Enzymes: No results for input(s): CKTOTAL, CKMB, CKMBINDEX, TROPONINI in the last 168 hours. BNP (last 3 results) No results for input(s): PROBNP in the last 8760 hours. HbA1C: No results for input(s): HGBA1C in the last 72 hours. CBG: No results for input(s): GLUCAP in the last 168 hours. Lipid Profile: No results for input(s): CHOL, HDL, LDLCALC, TRIG, CHOLHDL, LDLDIRECT in the last 72 hours. Thyroid Function Tests: No results for input(s): TSH, T4TOTAL, FREET4, T3FREE, THYROIDAB in the last 72 hours. Anemia Panel: No results for  input(s): VITAMINB12, FOLATE, FERRITIN, TIBC, IRON, RETICCTPCT in the last 72 hours. Urine analysis:    Component Value Date/Time   BILIRUBINUR neg 03/19/2015 1159   PROTEINUR neg 03/19/2015 1159   UROBILINOGEN negative 03/19/2015 1159   NITRITE neg 03/19/2015 1159   LEUKOCYTESUR small (1+) (A) 03/19/2015 1159  Sepsis Labs: No results found for this or any previous visit (from the past 240 hour(s)).   Radiological Exams on Admission: CT Head Wo Contrast  Result Date: 06/23/2020 CLINICAL DATA:  Un witnessed fall, left hip pain EXAM: CT HEAD WITHOUT CONTRAST TECHNIQUE: Contiguous axial images were obtained from the base of the skull through the vertex without intravenous contrast. COMPARISON:  12/07/2017 FINDINGS: Brain: Extensive confluent hypodensities throughout the periventricular and subcortical white matter are consistent with chronic small vessel ischemic changes. This has progressed since the previous MRI. No signs of acute infarct or hemorrhage. The lateral ventricles and midline structures are unremarkable. No acute extra-axial fluid collections. No mass effect. Vascular: No hyperdense vessel or unexpected calcification. Skull: Normal. Negative for fracture or focal lesion. Sinuses/Orbits: Postsurgical changes from bilateral turbinectomies and partial ethmoidectomy. There is mucosal thickening throughout the paranasal sinuses, most pronounced within the maxillary sinuses. No gas fluid levels. Other: None. IMPRESSION: 1. No acute intracranial process. 2. Extensive chronic small-vessel ischemic changes throughout the periventricular white matter. 3. Pansinus disease. Electronically Signed   By: Randa Ngo M.D.   On: 06/03/2020 02:29   DG Chest Portable 1 View  Result Date: 05/28/2020 CLINICAL DATA:  Un witnessed fall, left hip pain, history of non-small cell lung cancer EXAM: PORTABLE CHEST 1 VIEW COMPARISON:  05/09/2020 FINDINGS: Single frontal view of the chest demonstrates right  chest wall port via internal jugular approach tip overlying the superior vena cava. The cardiac silhouette is unremarkable. Right hilar mass seen on prior CT and x-ray again identified, without appreciable change. There is some improved aeration at the right lung base since prior chest x-ray. However, the right pleural effusion seen previously has increased significantly in volume. The left chest is clear.  No acute bony abnormalities. IMPRESSION: 1. Right hilar mass, compatible with patient's known lung cancer. 2. Large right pleural effusion, increased since prior study. 3. Persistent consolidation at the right lung base, though improved aeration since prior chest x-ray. Electronically Signed   By: Randa Ngo M.D.   On: 06/27/2020 02:56   DG HIP UNILAT WITH PELVIS 2-3 VIEWS LEFT  Result Date: 06/25/2020 CLINICAL DATA:  Un witnessed fall, left hip pain EXAM: DG HIP (WITH OR WITHOUT PELVIS) 2-3V LEFT COMPARISON:  05/31/2019 FINDINGS: Frontal view of the pelvis as well as frontal and frogleg lateral views of the left hip are obtained. Left hip hemiarthroplasty is again identified. There is increased osteopenia within the proximal left femur since prior exam. Cortical discontinuity along the lateral aspect of the greater trochanter, best seen on the frontal views, suspicious for nondisplaced periprosthetic fracture. No other acute bony abnormalities. Right hip is unremarkable. Soft tissues are grossly normal. IMPRESSION: 1. Findings suspicious for a nondisplaced periprosthetic fracture through the osteopenic greater trochanter abutting the left hip hemiarthroplasty. The hemiarthroplasty remains well aligned. Electronically Signed   By: Randa Ngo M.D.   On: 06/13/2020 02:54    EKG: Independently reviewed.  Atrial flutter at 98 bpm and ventricular bigeminy  Assessment/Plan  Fracture of left hip secondary to fall Acute respiratory failure with hypoxia Acute on chronic systolic congestive heart  failure exacerbation Non-small cell lung cancer stage IV(right) Malignant right-sided pleural effusion Hypotension Acute kidney injury superimposed on chronic kidney disease stage IIIb Normocytic anemia Hypothyroidism Hospice/end-of-life DNR Patient was already in the process of being set up with hospice.  Chemoradiation for stage IV non-small cell lung cancer have been stopped in January 2022 after patient received 18 cycles after progression of  decline in patient's health.  Patient had fallen out of bed and was seen to have a acute left periprosthetic fracture of the left hip.  Orthopedics have been consulted but it is nonoperable.  Labs significant for leukocytosis thought to be secondary to acute fracture as well as acute worsening kidney function likely related to patient's poor p.o. intake.  Hemoglobin appears fairly stable at 8.4 .x-ray imaging of the chest noted progressive right-sided pleural effusion.  No beds available at [facility] at this time.  Discussed with family patient is to remain a DNR.  Plan for comfort care measures until bed available at inpatient facility.  -Admit to a palliative care bed -End of life order set initiated -Discontinue cardiac monitoring -Routine vital sign checks -Discontinued home medications -Aspiration precautions -Okay for ice chips -Maintain IV access -IV fluids KVO  -Continuous nasal cannula oxygen as needed for comfort -IV morphine prn pain(adjust dose as needed) -Zofran IV prn nausea/vomiting -Ativan IV prn anxiety -Haloperidol IV prn agitation or delirium -Glycopyrrolate prn excessive secretions -Albuterol prn wheezing   -Palliative care consult consulted -Okay for RN to pronounce death     DVT prophylaxis: none   Code Status: DNR  Family Communication: Granddaughter updated over the Disposition Plan: Hospitalization possibly to end in death unless able to be transferred to a hospice facility Consults called: palliative  care Admission status: inpatient, require more than 2 midnight stay  Norval Morton MD [facility]   If 7PM-7AM, please contact night-coverage   06/13/2020, 2:13 PM

## 2020-06-14 NOTE — Progress Notes (Signed)
Manufacturing engineer [facility])  Lisa [name] is our current hospice patient with a terminal dx of lung cancer. She experienced a fall over night and family elected to send her to the ED as they are unable to provide the level of care she needs at home.  Family would prefer a bed for her at one of our residential facilities for EOL care. We do not currently have a bed that is open for her. She also will need to be deemed eligible by our attending for EOL care at [facility] (life expectancy of < 2 weeks).   Updated bedside RN and TOC SW that she does not have a discharge plan currently as the family can no longer provide the care she needs. She likely needs to be admitted for symptom management.  Thank you, Venia Carbon RN, BSN, [facility] Liaison

## 2020-06-14 NOTE — Consult Note (Signed)
Consultation Note Date: 06/13/2020   Patient Name: Lisa [name]  DOB: [birth year]  MRN: [id number]  Age / Sex: 87 y.o., female  PCP: [name], MD Referring Physician: Norval Morton, MD  Reason for Consultation: Non pain symptom management, Pain control, Psychosocial/spiritual support and Terminal Care  HPI/Patient Profile: 87 y.o. female  with past medical history of advanced dementia, HTN, CHF, non-small cell lung cancer diagnosed in 2019 s/p chemoradiation, and s/p left hip hemiarthroplasty in 2021 presented to the ED on 06/10/2020 after an unwitnessed fall at home. Patient is currently under hospice care with [facility] for terminal diagnosis of lung cancer. Family are unable to provide the care patient now requires and are requesting bed at [facility]. [facility] does not have bed to offer today and therefore patient was admitted on 06/03/2020 for end of life care. Noted patient is GIP admission.  Clinical Assessment and Goals of Care: I have reviewed medical records including EPIC notes, labs, and imaging. Reached out to [facility] liaison - she states PMT involvement would be beneficial to assist with answering family's questions and assist with symptom management. Reviewed case and received updates from [facility] liaison. Received report from primary RN - no acute concerns.  Went to visit patient at bedside -  Two granddaughters, Nyra Jabs and Bartelso were present. Patient was lying in bed asleep - she does not wake to voice/gentle touch - she is unable to participate in conversation. No signs or non-verbal gestures of pain or discomfort noted. No respiratory distress, increased work of breathing, or secretions noted. She is currently on 15L O2 simple face mask.   Met with Luiz Iron and Kayle to discuss EOL wishes, disposition, and options.  Therapeutic listening and emotional support provided to family. Family describe Ms.  Huestis as a "social butterfly" and someone who really enjoys joking and laughing with her family.   Family's goals are very clear at this time - they wish to keep her comfortable and free from suffering for the time she has left. We talked about transition to comfort measures in house and what that would entail inclusive of medications to control pain, dyspnea, agitation, nausea, itching, and hiccups. We discussed stopping all unnecessary measures such as blood draws, needle sticks, oxygen, antibiotics, CBGs/insulin, cardiac monitoring, and frequent vital signs. Family are hopeful patient can be transferred to [facility] as soon as possible - their ultimate goal is for patient not to pass away in the hospital (even though they understand she is at high risk to decline and hospital death may occur). It is for this reason they are not agreeable to discontinue 15L O2 simple face mask. They would like to keep the patient stable enough for transfer if accepted at [facility] tomorrow. Discussed that patient may be accepted, but unfortunately that does not mean there may be an available bed - family express understanding. They are willing to re-discuss patient's oxygen requirements tomorrow pending [facility] evaluation.   Advance directives were considered and discussed - Emilee and Kayle are both [place].  Education provided on current [facility] EOL visitation policy - family expressed understanding.  Visit also consisted of discussions dealing with the complex and emotionally intense issues of symptom management and palliative care in the setting of serious and life-threatening illness. Palliative care team will continue to support patient, patient's family, and medical team.  Discussed with patient/family the importance of continued conversation with each other and the medical providers regarding overall plan of care and treatment options, ensuring decisions are within the context of the patient's values and GOCs.     Questions and concerns were addressed. The patient/family was encouraged to call with questions and/or concerns. PMT card was provided.   Primary Decision Maker: HCPOA granddaughter/Emilee Tameem Pullara and granddaughter/Kayle    SUMMARY OF RECOMMENDATIONS   . Continue full comfort measures - prognosis likely hours to days depending on oxygen use . Continue DNR/DNI as previously documented . Evaluation pending for [facility] - if approved transfer as soon as bed is available. Family are clear their goal is for patient not to pass away in house; however, patient's window for low risk transfer may close soon . Continue 15L O2 via simple face mask - do not escalate oxygen based on saturations. Family's goal is to keep patient stable enough for transfer to [facility] hopefully tomorrow 3/19. Family understand patient is at high risk for decline and may pass away in house . Added orders for EOL symptom management and to reflect full comfort measures, as well as discontinued orders that were not focused on comfort . Unrestricted visitation orders were placed per current [place] EOL visitation policy  . Nursing to provide frequent assessments and administer PRN medications as clinically necessary to ensure EOL comfort . PMT will continue to follow and support holistically   Code Status/Advance Care Planning:  DNR  Palliative Prophylaxis:   Aspiration, Bowel Regimen, Delirium Protocol, Frequent Pain Assessment, Oral Care and Turn Reposition  Additional Recommendations (Limitations, Scope, Preferences):  Full Comfort Care  Psycho-social/Spiritual:   Desire for further Chaplaincy support:no Created space and opportunity for patient and family to express thoughts and feelings regarding patient's current medical situation.   Emotional support and therapeutic listening provided.  Prognosis:   Hours - Days   Discharge Planning: Hospice facility      Primary Diagnoses: Present on  Admission: . Non-small cell carcinoma of lung, stage 4, right ([place]) . HFrEF (heart failure with reduced ejection fraction) ([place]) . Fall . Anemia . Closed fracture of left hip ([place]) . DNR (do not resuscitate)   I have reviewed the medical record, interviewed the patient and family, and examined the patient. The following aspects are pertinent.  Past Medical History:  Diagnosis Date  . Diverticulosis   . Headache   . Hemorrhoids    internal and external  . Hyperlipidemia 01/29/1991  . Hypertension 10/29/1995  . Hypothyroidism 10/29/1995  . Lung mass    right middle lobe  . Melanoma ([place])    h/o, local excision. no chemo or rady.   . Skin cancer (melanoma) ([place])   . Tubular adenoma of colon   . Wears dentures    Social History   Socioeconomic History  . Marital status: Married    Spouse name: Not on file  . Number of children: 2  . Years of education: Not on file  . Highest education level: Not on file  Occupational History  . Occupation: Retired    Fish farm manager: RETIRED    Comment: Technical brewer  Tobacco  Use  . Smoking status: Former Smoker    Packs/day: 1.00    Years: 30.00    Pack years: 30.00    Types: Cigarettes    Start date: 10/28/1958    Quit date: 10/27/1988    Years since quitting: 31.6  . Smokeless tobacco: Never Used  . Tobacco comment: quit 1994  Vaping Use  . Vaping Use: Never used  Substance and Sexual Activity  . Alcohol use: No  . Drug use: No  . Sexual activity: Never  Other Topics Concern  . Not on file  Social History Narrative   Married 1957 lives with husband   Social Determinants of Health   Financial Resource Strain: Not on file  Food Insecurity: Not on file  Transportation Needs: Not on file  Physical Activity: Not on file  Stress: Not on file  Social Connections: Not on file   Family History  Problem Relation Age of Onset  . Stroke Mother   . Heart disease Mother        CHF  . Stomach cancer Sister   . Cancer  Brother        Metastatic cancer  . Colon cancer Neg Hx   . Breast cancer Neg Hx    Scheduled Meds: Continuous Infusions: PRN Meds:.acetaminophen **OR** acetaminophen, albuterol, antiseptic oral rinse, diphenhydrAMINE, glycopyrrolate **OR** glycopyrrolate **OR** glycopyrrolate, haloperidol **OR** haloperidol **OR** haloperidol lactate, LORazepam **OR** LORazepam **OR** LORazepam, morphine injection, ondansetron **OR** ondansetron (ZOFRAN) IV, polyvinyl alcohol Medications Prior to Admission:  Prior to Admission medications   Medication Sig Start Date End Date Taking? Authorizing Provider  albuterol (VENTOLIN HFA) 108 (90 Base) MCG/ACT inhaler Inhale 2 puffs into the lungs every 6 (six) hours as needed for wheezing or shortness of breath. 04/08/20  Yes Heilingoetter, Cassandra L, PA-C  Coenzyme Q10 100 MG capsule Take 100 mg by mouth daily.    Yes [provider]  fluticasone (FLONASE) 50 MCG/ACT nasal spray USE 2 SPRAYS IN EACH NOSTRIL DAILY AS NEEDED FOR ALLERGIES 08/02/18  Yes [name], MD  furosemide (LASIX) 40 MG tablet Take 1 tablet (40 mg total) by mouth daily. Patient taking differently: Take 40 mg by mouth at bedtime. 06/05/20 09/03/20 Yes Cleaver, Jossie Ng, NP  ipratropium-albuterol (DUONEB) 0.5-2.5 (3) MG/3ML SOLN Take 3 mLs by nebulization every 6 (six) hours as needed (shortness of breath/wheezing). 06/11/20  Yes [provider]  levothyroxine (SYNTHROID) 75 MCG tablet Take 1 tablet by mouth once daily 03/20/20  Yes [name], MD  loratadine (CLARITIN) 10 MG tablet Take 1 tablet (10 mg total) by mouth daily as needed for allergies or rhinitis. 08/02/18  Yes [name], MD  LORazepam (ATIVAN) 0.5 MG tablet Take 0.5 mg by mouth at bedtime. 06/11/20  Yes [provider]  mirtazapine (REMERON) 15 MG tablet Take 15 mg by mouth at bedtime. 06/11/20  Yes [provider]  Multiple Vitamin (MULTIVITAMIN WITH MINERALS) TABS tablet Take 1 tablet by  mouth daily. 06/03/19  Yes Isaac Bliss, Rayford Halsted, MD  potassium chloride SA (KLOR-CON M20) 20 MEQ tablet Take 1 tablet (20 mEq total) by mouth daily. Patient taking differently: Take 20 mEq by mouth at bedtime. 06/05/20 09/03/20 Yes Cleaver, Jossie Ng, NP  sennosides-docusate sodium (SENOKOT-S) 8.6-50 MG tablet Take 1 tablet by mouth daily.   Yes [provider]  sertraline (ZOLOFT) 50 MG tablet Take 50 mg by mouth daily. 06/11/20  Yes [provider]  sodium chloride (OCEAN) 0.65 % SOLN nasal spray  Place 1 spray into both nostrils as needed for congestion.   Yes [provider]  amLODipine (NORVASC) 10 MG tablet Take 1 tablet (10 mg total) by mouth daily. Will need office visit with PCP for future refills. Patient not taking: No sig reported 05/29/20   [name], MD   Allergies  Allergen Reactions  . Atorvastatin Other (See Comments)    CAUSED BURNING AND TINGLING IN LEGS  . Celebrex [Celecoxib] Swelling and Rash    SWELLING REACTION UNSPECIFIED   . Cholecalciferol Anxiety and Other (See Comments)    Nervous, crying with high dose replacement  . Simvastatin Other (See Comments)    Muscle pain  . Tape Other (See Comments)    SKIN IS SENSITIVE!!   Review of Systems  Unable to perform ROS: Acuity of condition    Physical Exam Vitals and nursing note reviewed.  Constitutional:      General: She is not in acute distress.    Appearance: She is ill-appearing.     Comments: Frail appearing  Pulmonary:     Effort: No respiratory distress.  Skin:    General: Skin is cool and dry.  Neurological:     Mental Status: She is lethargic.     Motor: Weakness present.  Psychiatric:        Speech: She is noncommunicative.     Vital Signs: BP (!) 94/50   Pulse 75   Temp 98 F (36.7 C) (Oral)   Resp (!) 23   SpO2 97%      Pain Score: Asleep   SpO2: SpO2: 97 % O2 Device:SpO2: 97 % O2 Flow Rate: .O2 Flow Rate (L/min): 3 L/min  IO: Intake/output summary:  No intake or output data in the 24 hours ending 06/20/2020 1734  LBM:   Baseline Weight:   Most recent weight:       Palliative Assessment/Data: PPS 10%     Time In: 1740 Time Out: 1850 Time Total: 70 minutes  Greater than 50%  of this time was spent counseling and coordinating care related to the above assessment and plan.  Signed by: Lin Landsman, NP   Please contact Palliative Medicine Team phone at [phone] for questions and concerns.  For individual provider: See Shea Evans

## 2020-06-14 NOTE — ED Notes (Signed)
Patient transported to X-ray 

## 2020-06-14 NOTE — Progress Notes (Signed)
CSW contacted [facility] to see which liason was following patient. CSW was informed that it will either be [facility] or [facility]. [facility] will meet with the family today to discuss aftercare from the hospital.

## 2020-06-14 NOTE — ED Notes (Signed)
Pt placed on 5L nasal canula per RN, [place] after desat to 83%.

## 2020-06-14 NOTE — ED Notes (Signed)
Pt resting, granddaughter at bedside. Will continue to monitor.

## 2020-06-14 NOTE — ED Provider Notes (Signed)
[facility] EMERGENCY DEPARTMENT Provider Note   CSN: [id number] Arrival date & time: 06/25/2020  0128     History Chief Complaint  Patient presents with  . Fall    Lisa [name] is a 87 y.o. female.  Patient with severe dementia so history is obtained by EMS who got it from the family.  Patient apparently had a witnessed fall.  Severe left hip pain since then but then also states she might have had some right hip pain as well.  Unknown if she hit her head or not.  Unknown loss of consciousness.  Unknown if she is on anticoagulants.  She is reportedly a DNR but there is no paperwork to support this.  On the way here patient was reportedly hypoxic requiring nonrebreather.   Fall This is a new problem. The current episode started 1 to 2 hours ago. The problem occurs constantly. The problem has not changed since onset.Pertinent negatives include no chest pain, no abdominal pain, no headaches and no shortness of breath. Nothing aggravates the symptoms. Nothing relieves the symptoms.       Past Medical History:  Diagnosis Date  . Diverticulosis   . Headache   . Hemorrhoids    internal and external  . Hyperlipidemia 01/29/1991  . Hypertension 10/29/1995  . Hypothyroidism 10/29/1995  . Lung mass    right middle lobe  . Melanoma ([place])    h/o, local excision. no chemo or rady.   . Skin cancer (melanoma) ([place])   . Tubular adenoma of colon   . Wears dentures     Patient Active Problem List   Diagnosis Date Noted  . HFrEF (heart failure with reduced ejection fraction) ([place]) 06/04/2020  . Anemia 04/18/2020  . Fall   . Closed fracture of left hip ([place]) 05/30/2019  . Non-small cell carcinoma of lung, stage 4, right ([place]) 03/27/2019  . Port-A-Cath in place 09/21/2018  . Sore throat 08/03/2018  . Healthcare maintenance 07/27/2018  . Hypercalcemia 04/28/2018  . DVT (deep venous thrombosis) ([place]) 04/28/2018  . Dehydration 04/28/2018  . Hypokalemia 04/28/2018  .  Encounter for antineoplastic immunotherapy 02/16/2018  . Stage III squamous cell carcinoma of right lung ([place]) 12/02/2017  . Encounter for antineoplastic chemotherapy 12/02/2017  . Goals of care, counseling/discussion 12/02/2017  . Vitamin D deficiency 06/19/2016  . Elevated serum creatinine 06/19/2016  . Advance care planning 06/08/2014  . Medicare annual wellness visit, subsequent 05/19/2012  . Dysuria 11/26/2010  . Bilateral leg edema 11/26/2010  . OBESITY 12/15/2006  . Hypothyroidism 10/29/1995  . Essential hypertension 10/29/1995  . HLD (hyperlipidemia) 01/29/1991    Past Surgical History:  Procedure Laterality Date  . BRONCHIAL BIOPSY  11/23/2017   Procedure: BRONCHIAL BIOPSIES;  Surgeon: Grace Isaac, MD;  Location: [facility] OR;  Service: Thoracic;;  . cataract surgery  2007, 2008   repair bilat  . DG BARIUM ENEMA ([place] HX) N/A 2016   [name]  . HERNIA REPAIR  04/25/2001  . HIP ARTHROPLASTY Left 05/31/2019   Procedure: LEFT HIP HEMIARTHROPLASTY;  Surgeon: Hiram Gash, MD;  Location: [facility];  Service: Orthopedics;  Laterality: Left;  . IR IMAGING GUIDED PORT INSERTION  07/05/2018  . IR US GUIDE BX ASP/DRAIN  11/17/2017  . KNEE SURGERY     meniscus tear per Dr. Ronnie Derby, R knee  . MULTIPLE TOOTH EXTRACTIONS    . SEPTOPLASTY  2006   Dr. Ernesto Rutherford  . SKIN CANCER EXCISION    . TONSILLECTOMY  1954  .  VAGINAL HYSTERECTOMY    . VIDEO BRONCHOSCOPY WITH ENDOBRONCHIAL ULTRASOUND N/A 11/23/2017   Procedure: VIDEO BRONCHOSCOPY WITH ENDOBRONCHIAL ULTRASOUND;  Surgeon: Grace Isaac, MD;  Location: [facility];  Service: Thoracic;  Laterality: N/A;     OB History   No obstetric history on file.     Family History  Problem Relation Age of Onset  . Stroke Mother   . Heart disease Mother        CHF  . Stomach cancer Sister   . Cancer Brother        Metastatic cancer  . Colon cancer Neg Hx   . Breast cancer Neg Hx     Social History   Tobacco Use  . Smoking status: Former  Smoker    Packs/day: 1.00    Years: 30.00    Pack years: 30.00    Types: Cigarettes    Start date: 10/28/1958    Quit date: 10/27/1988    Years since quitting: 31.6  . Smokeless tobacco: Never Used  . Tobacco comment: quit 1994  Vaping Use  . Vaping Use: Never used  Substance Use Topics  . Alcohol use: No  . Drug use: No    Home Medications Prior to Admission medications   Medication Sig Start Date End Date Taking? Authorizing Provider  albuterol (VENTOLIN HFA) 108 (90 Base) MCG/ACT inhaler Inhale 2 puffs into the lungs every 6 (six) hours as needed for wheezing or shortness of breath. 04/08/20  Yes Heilingoetter, Cassandra L, PA-C  Coenzyme Q10 100 MG capsule Take 100 mg by mouth daily.    Yes [provider]  fluticasone (FLONASE) 50 MCG/ACT nasal spray USE 2 SPRAYS IN EACH NOSTRIL DAILY AS NEEDED FOR ALLERGIES 08/02/18  Yes [name], MD  furosemide (LASIX) 40 MG tablet Take 1 tablet (40 mg total) by mouth daily. Patient taking differently: Take 40 mg by mouth at bedtime. 06/05/20 09/03/20 Yes Cleaver, Jossie Ng, NP  ipratropium-albuterol (DUONEB) 0.5-2.5 (3) MG/3ML SOLN Take 3 mLs by nebulization every 6 (six) hours as needed (shortness of breath/wheezing). 06/11/20  Yes [provider]  levothyroxine (SYNTHROID) 75 MCG tablet Take 1 tablet by mouth once daily 03/20/20  Yes [name], MD  loratadine (CLARITIN) 10 MG tablet Take 1 tablet (10 mg total) by mouth daily as needed for allergies or rhinitis. 08/02/18  Yes [name], MD  LORazepam (ATIVAN) 0.5 MG tablet Take 0.5 mg by mouth at bedtime. 06/11/20  Yes [provider]  mirtazapine (REMERON) 15 MG tablet Take 15 mg by mouth at bedtime. 06/11/20  Yes [provider]  Multiple Vitamin (MULTIVITAMIN WITH MINERALS) TABS tablet Take 1 tablet by mouth daily. 06/03/19  Yes Isaac Bliss, Rayford Halsted, MD  potassium chloride SA (KLOR-CON M20) 20 MEQ tablet Take 1 tablet (20 mEq total) by mouth  daily. Patient taking differently: Take 20 mEq by mouth at bedtime. 06/05/20 09/03/20 Yes Cleaver, Jossie Ng, NP  sennosides-docusate sodium (SENOKOT-S) 8.6-50 MG tablet Take 1 tablet by mouth daily.   Yes [provider]  sertraline (ZOLOFT) 50 MG tablet Take 50 mg by mouth daily. 06/11/20  Yes [provider]  sodium chloride (OCEAN) 0.65 % SOLN nasal spray Place 1 spray into both nostrils as needed for congestion.   Yes [provider]  amLODipine (NORVASC) 10 MG tablet Take 1 tablet (10 mg total) by mouth daily. Will need office visit with PCP for future refills. Patient not taking: No sig reported 05/29/20  [name], MD    Allergies    Atorvastatin, Celebrex [celecoxib], Cholecalciferol, Simvastatin, and Tape  Review of Systems   Review of Systems  Respiratory: Negative for shortness of breath.   Cardiovascular: Negative for chest pain.  Gastrointestinal: Negative for abdominal pain.  Neurological: Negative for headaches.    Physical Exam Updated Vital Signs BP (!) 100/52   Pulse 65   Temp 97.9 F (36.6 C) (Oral)   Resp (!) 24   SpO2 100%   Physical Exam Vitals and nursing note reviewed.  Constitutional:      Appearance: She is well-developed.  HENT:     Head: Normocephalic and atraumatic.     Mouth/Throat:     Mouth: Mucous membranes are moist.     Pharynx: Oropharynx is clear.  Eyes:     Pupils: Pupils are equal, round, and reactive to light.  Cardiovascular:     Rate and Rhythm: Normal rate and regular rhythm.  Pulmonary:     Effort: No respiratory distress.     Breath sounds: No stridor.  Abdominal:     General: Abdomen is flat. There is no distension.  Musculoskeletal:        General: Tenderness (left hip with palpation and ROM) present.     Cervical back: Normal range of motion.  Skin:    General: Skin is warm and dry.  Neurological:     General: No focal deficit present.     Mental Status: She is alert.     ED Results /  Procedures / Treatments   Labs (all labs ordered are listed, but only abnormal results are displayed) Labs Reviewed  CBC WITH DIFFERENTIAL/PLATELET - Abnormal; Notable for the following components:      Result Value   WBC 15.0 (*)    RBC 3.10 (*)    Hemoglobin 8.4 (*)    HCT 29.5 (*)    MCHC 28.5 (*)    RDW 15.7 (*)    Neutro Abs 13.7 (*)    Lymphs Abs 0.5 (*)    Abs Immature Granulocytes 0.27 (*)    All other components within normal limits  COMPREHENSIVE METABOLIC PANEL - Abnormal; Notable for the following components:   Glucose, Bld 117 (*)    BUN 35 (*)    Creatinine, Ser 2.04 (*)    Calcium 8.6 (*)    Total Protein 5.5 (*)    Albumin 1.7 (*)    GFR, Estimated 23 (*)    All other components within normal limits  URINALYSIS, ROUTINE W REFLEX MICROSCOPIC    EKG None  Radiology CT Head Wo Contrast  Result Date: 06/22/2020 CLINICAL DATA:  Un witnessed fall, left hip pain EXAM: CT HEAD WITHOUT CONTRAST TECHNIQUE: Contiguous axial images were obtained from the base of the skull through the vertex without intravenous contrast. COMPARISON:  12/07/2017 FINDINGS: Brain: Extensive confluent hypodensities throughout the periventricular and subcortical white matter are consistent with chronic small vessel ischemic changes. This has progressed since the previous MRI. No signs of acute infarct or hemorrhage. The lateral ventricles and midline structures are unremarkable. No acute extra-axial fluid collections. No mass effect. Vascular: No hyperdense vessel or unexpected calcification. Skull: Normal. Negative for fracture or focal lesion. Sinuses/Orbits: Postsurgical changes from bilateral turbinectomies and partial ethmoidectomy. There is mucosal thickening throughout the paranasal sinuses, most pronounced within the maxillary sinuses. No gas fluid levels. Other: None. IMPRESSION: 1. No acute intracranial process. 2. Extensive chronic small-vessel ischemic changes throughout the  periventricular white matter.  3. Pansinus disease. Electronically Signed   By: Randa Ngo M.D.   On: 06/17/2020 02:29   DG Chest Portable 1 View  Result Date: 06/01/2020 CLINICAL DATA:  Un witnessed fall, left hip pain, history of non-small cell lung cancer EXAM: PORTABLE CHEST 1 VIEW COMPARISON:  05/09/2020 FINDINGS: Single frontal view of the chest demonstrates right chest wall port via internal jugular approach tip overlying the superior vena cava. The cardiac silhouette is unremarkable. Right hilar mass seen on prior CT and x-ray again identified, without appreciable change. There is some improved aeration at the right lung base since prior chest x-ray. However, the right pleural effusion seen previously has increased significantly in volume. The left chest is clear.  No acute bony abnormalities. IMPRESSION: 1. Right hilar mass, compatible with patient's known lung cancer. 2. Large right pleural effusion, increased since prior study. 3. Persistent consolidation at the right lung base, though improved aeration since prior chest x-ray. Electronically Signed   By: Randa Ngo M.D.   On: 06/26/2020 02:56   DG HIP UNILAT WITH PELVIS 2-3 VIEWS LEFT  Result Date: 06/10/2020 CLINICAL DATA:  Un witnessed fall, left hip pain EXAM: DG HIP (WITH OR WITHOUT PELVIS) 2-3V LEFT COMPARISON:  05/31/2019 FINDINGS: Frontal view of the pelvis as well as frontal and frogleg lateral views of the left hip are obtained. Left hip hemiarthroplasty is again identified. There is increased osteopenia within the proximal left femur since prior exam. Cortical discontinuity along the lateral aspect of the greater trochanter, best seen on the frontal views, suspicious for nondisplaced periprosthetic fracture. No other acute bony abnormalities. Right hip is unremarkable. Soft tissues are grossly normal. IMPRESSION: 1. Findings suspicious for a nondisplaced periprosthetic fracture through the osteopenic greater trochanter abutting  the left hip hemiarthroplasty. The hemiarthroplasty remains well aligned. Electronically Signed   By: Randa Ngo M.D.   On: 05/29/2020 02:54    Procedures Procedures   Medications Ordered in ED Medications  naloxone (NARCAN) 0.4 MG/ML injection (has no administration in time range)  fentaNYL (SUBLIMAZE) injection 50 mcg (50 mcg Intravenous Given 06/09/2020 0341)    ED Course  I have reviewed the triage vital signs and the nursing notes.  Pertinent labs & imaging results that were available during my care of the patient were reviewed by me and considered in my medical decision making (see chart for details).    MDM Rules/Calculators/A&P                         eval for unwitnessed fall and traumatic injuries. nonsurgical greater troch fracture (discussed with Dr. Doreatha Martin) WBAT and pain control. Has W/C and ramp at home. Hospice in place, but needs O2 at home, needs more assistance, discussions with granddaughter at bedside and informed hospice nurse on call. Will need final disposition in AM.   Final Clinical Impression(s) / ED Diagnoses Final diagnoses:  Pain    Rx / DC Orders ED Discharge Orders    None       Mesner, Corene Cornea, MD 06/07/2020 [phone]

## 2020-06-14 NOTE — ED Triage Notes (Addendum)
Pt arrived via [facility] for unwitnessed fall. Pt complaining of left hip pain. EMS unable to provide information on if pt is on blood thinners, if she hit her head, or provide pts DNR form. Discoloration and swelling noted around right eye. EMS reports pt desated enroute and was temporarily placed on a NR, saturation improved, held in the 90s on RA. Afib noted on EKG.

## 2020-06-14 NOTE — Progress Notes (Addendum)
L[facility] [name] Emergency Department 037 Hospitalized Hospice Patient [facility])  Lisa [name] is a current hospice patient that was admitted to our services on 06/08/20 with a terminal diagnosis of lung cancer per Dr. Tomasa Hosteller with [facility]. Patient had an unwitnessed fall on 3/18 and was unable to stand again and questionable loss of consciousness. Family notified hospice and sent her to the Emergency Department. Family is unable to provide the level of care that she is now requiring and they have requested [facility]. There is not a bed to offer her today, and is pending approval from our attending. She will be admitted for symptom management of her hip pain.  Visited with patient and her granddaughter at the bedside. Patient does not interact during this visit. Per staff, she was showing symptoms of pain and agitation and the staff treated her accordingly.   V/S: 98 oral, 94/50, HR 77, RR 23, SPO2 97% on simple mask @ 4 lpm Labs: BUN 35, Cr 2.04, Albumin 1.7, total protein 5.5, GFR 23, WBC 15, RBC 3.10, hgb 8.4, HCT 29.5 Diagnostics:  - portable chest -  1. Right hilar mass, compatible with patient's known lung cancer. 2. Large right pleural effusion, increased since prior study. 3. Persistent consolidation at the right lung base, though improved aeration since prior chest x-ray. - hip unilateral with pelvis -  1. Findings suspicious for a nondisplaced periprosthetic fracture through the osteopenic greater trochanter abutting the left hip hemiarthroplasty. The hemiarthroplasty remains well aligned. IVs/ PRNs: LR 1 liter IV x 1 for hypotension, morphine 4 mg IV x 1 for pain, fentanyl 50 mcg IV x 1 for pain  Problem List: - left hip pain - s/p fall, pt comfortable during visit - large pleural effusion - O2 for comfort, full comfort measures and no further treatment - lung cancer - under hospice services, no further treatment, comfort focused in nature.   GOC: clear,  full comfort measures D/C planning: ongoing, family cannot care for her in her current state, admitted for pain management IDT: hospice team updated Family: granddaughter Lisa [name] at bedside  Transfer summary and med list to chart.  Venia Carbon RN, BSN, [facility] Liaison

## 2020-06-14 NOTE — ED Notes (Addendum)
Post fentanyl administration, pt mentation decreased and supplemental oxygen was increased. Narcan was overrode and kept at bedside as safety measure. Pt settled after a few minutes and is now resting comfortably. Will continue to monitor.

## 2020-06-14 NOTE — ED Notes (Signed)
Pt moving around in bed, appears uncomfortable and moaning

## 2020-06-14 NOTE — ED Notes (Signed)
Family at bedside now. DNR and medication list obtained.

## 2020-06-15 DIAGNOSIS — N184 Chronic kidney disease, stage 4 (severe): Secondary | ICD-10-CM

## 2020-06-15 DIAGNOSIS — J9601 Acute respiratory failure with hypoxia: Secondary | ICD-10-CM

## 2020-06-20 ENCOUNTER — Other Ambulatory Visit: Payer: Medicare Other

## 2020-06-20 ENCOUNTER — Ambulatory Visit: Payer: Medicare Other

## 2020-06-20 ENCOUNTER — Ambulatory Visit: Payer: Medicare Other | Admitting: Physician Assistant

## 2020-06-20 DIAGNOSIS — J9601 Acute respiratory failure with hypoxia: Secondary | ICD-10-CM | POA: Diagnosis present

## 2020-06-20 DIAGNOSIS — N184 Chronic kidney disease, stage 4 (severe): Secondary | ICD-10-CM | POA: Diagnosis present

## 2020-06-28 NOTE — Progress Notes (Signed)
Patient expired time of 0045 witnessed by family at bedside. Confirmed by this nurse and Threasa Beards, RN. MD on call paged; no response. Family already notified. Medical examiner called; patient ME case. Will let family have personal time with patient first before transporting to morgue.

## 2020-06-28 NOTE — Discharge Summary (Addendum)
Death Summary  Lisa [name] WUJ:[id number] DOB: 1933/02/09 DOA: 07-13-2020  PCP: [name], MD  Admit date: Jul 13, 2020 Date of Death: July 14, 2020 Time of Death: 00:45 Notification: [name], MD notified of death of 07/19/20   History of present illness:  Lisa [name] is a 87 y.o. female with a history of severe dementia, hypertension, hyperlipidemia, hypothyroidism, systolic congestive heart failure(last EF 40-45% with grade 1 diastolic dysfunction), non-small cell lung cancer diagnosed in 2019 s/p chemoradiation, and s/p left hip hemiarthroplasty in 05/2019. [name] presented with complaint of left hip pain after having unwitnessed fall at home [name] had been under hospice care with terminal diagnosis of metastatic lung cancer.  Patient was found to have concern for a nondisplaced periprosthetic fracture to the greater trochanter abutting the left hip hemiarthroplasty.  Family had wanted to have her admitted as they were unable to care for her at home given her condition.  Plan was to make her comfort care measures only and to transfer to [facility] once a bed was available.  All medications and therapies have been discontinued.  Patient was initially noted to be hypoxic down into the 70-80s and had been placed on up to 15 L by facemask for comfort and blood pressures initially ranged from 68/26-120/52.  Palliative care was formally consulted to assist with therapies.  Patient appeared to be in discomfort until able to be given morphine IV  for pain and Ativan IV for anxiety.  Nursing confirmed patient passed away 12:45 AM.   Final Diagnoses:   Non-small cell carcinoma of the lung, stage IV right  Acute respiratory failure with hypoxia  Acute on chronic systolic congestive heart failure exacerbation  Malignant right-sided pleural effusion  Closed fracture of left hip secondary to fall  Chronic kidney disease stage IV  Normocytic anemia  DNR   The  results of significant diagnostics from this hospitalization (including imaging, microbiology, ancillary and laboratory) are listed below for reference.    Significant Diagnostic Studies: CT Head Wo Contrast  Result Date: July 13, 2020 CLINICAL DATA:  Un witnessed fall, left hip pain EXAM: CT HEAD WITHOUT CONTRAST TECHNIQUE: Contiguous axial images were obtained from the base of the skull through the vertex without intravenous contrast. COMPARISON:  12/07/2017 FINDINGS: Brain: Extensive confluent hypodensities throughout the periventricular and subcortical white matter are consistent with chronic small vessel ischemic changes. This has progressed since the previous MRI. No signs of acute infarct or hemorrhage. The lateral ventricles and midline structures are unremarkable. No acute extra-axial fluid collections. No mass effect. Vascular: No hyperdense vessel or unexpected calcification. Skull: Normal. Negative for fracture or focal lesion. Sinuses/Orbits: Postsurgical changes from bilateral turbinectomies and partial ethmoidectomy. There is mucosal thickening throughout the paranasal sinuses, most pronounced within the maxillary sinuses. No gas fluid levels. Other: None. IMPRESSION: 1. No acute intracranial process. 2. Extensive chronic small-vessel ischemic changes throughout the periventricular white matter. 3. Pansinus disease. Electronically Signed   By: Randa Ngo M.D.   On: 2020-07-13 02:29   DG Chest Portable 1 View  Result Date: July 13, 2020 CLINICAL DATA:  Un witnessed fall, left hip pain, history of non-small cell lung cancer EXAM: PORTABLE CHEST 1 VIEW COMPARISON:  05/09/2020 FINDINGS: Single frontal view of the chest demonstrates right chest wall port via internal jugular approach tip overlying the superior vena cava. The cardiac silhouette is unremarkable. Right hilar mass seen on prior CT and x-ray again identified, without appreciable change. There is some improved aeration at the  right lung  base since prior chest x-ray. However, the right pleural effusion seen previously has increased significantly in volume. The left chest is clear.  No acute bony abnormalities. IMPRESSION: 1. Right hilar mass, compatible with patient's known lung cancer. 2. Large right pleural effusion, increased since prior study. 3. Persistent consolidation at the right lung base, though improved aeration since prior chest x-ray. Electronically Signed   By: Randa Ngo M.D.   On: 06/26/2020 02:56   DG HIP UNILAT WITH PELVIS 2-3 VIEWS LEFT  Result Date: 06/06/2020 CLINICAL DATA:  Un witnessed fall, left hip pain EXAM: DG HIP (WITH OR WITHOUT PELVIS) 2-3V LEFT COMPARISON:  05/31/2019 FINDINGS: Frontal view of the pelvis as well as frontal and frogleg lateral views of the left hip are obtained. Left hip hemiarthroplasty is again identified. There is increased osteopenia within the proximal left femur since prior exam. Cortical discontinuity along the lateral aspect of the greater trochanter, best seen on the frontal views, suspicious for nondisplaced periprosthetic fracture. No other acute bony abnormalities. Right hip is unremarkable. Soft tissues are grossly normal. IMPRESSION: 1. Findings suspicious for a nondisplaced periprosthetic fracture through the osteopenic greater trochanter abutting the left hip hemiarthroplasty. The hemiarthroplasty remains well aligned. Electronically Signed   By: Randa Ngo M.D.   On: 06/13/2020 02:54    Microbiology: No results found for this or any previous visit (from the past 240 hour(s)).   Labs: Basic Metabolic Panel: Recent Labs  Lab 06/13/2020 0141  NA 138  K 3.8  CL 99  CO2 31  GLUCOSE 117*  BUN 35*  CREATININE 2.04*  CALCIUM 8.6*   Liver Function Tests: Recent Labs  Lab 06/05/2020 0141  AST 22  ALT 24  ALKPHOS 91  BILITOT 1.2  PROT 5.5*  ALBUMIN 1.7*   No results for input(s): LIPASE, AMYLASE in the last 168 hours. No results for input(s): AMMONIA in the  last 168 hours. CBC: Recent Labs  Lab 06/09/2020 0141  WBC 15.0*  NEUTROABS 13.7*  HGB 8.4*  HCT 29.5*  MCV 95.2  PLT 215   Cardiac Enzymes: No results for input(s): CKTOTAL, CKMB, CKMBINDEX, TROPONINI in the last 168 hours. D-Dimer No results for input(s): DDIMER in the last 72 hours. BNP: Invalid input(s): POCBNP CBG: No results for input(s): GLUCAP in the last 168 hours. Anemia work up No results for input(s): VITAMINB12, FOLATE, FERRITIN, TIBC, IRON, RETICCTPCT in the last 72 hours. Urinalysis    Component Value Date/Time   BILIRUBINUR neg 03/19/2015 1159   PROTEINUR neg 03/19/2015 1159   UROBILINOGEN negative 03/19/2015 1159   NITRITE neg 03/19/2015 1159   LEUKOCYTESUR small (1+) (A) 03/19/2015 1159   Sepsis Labs Invalid input(s): PROCALCITONIN,  WBC,  LACTICIDVEN     SIGNED:  Norval Morton, MD  [facility] 06/20/2020, 7:22 AM Pager   If 7PM-7AM, please contact night-coverage [email or web address] Password [email or web address]

## 2020-06-28 DEATH — deceased

## 2020-07-26 DIAGNOSIS — R451 Restlessness and agitation: Secondary | ICD-10-CM | POA: Insufficient documentation

## 2020-07-26 NOTE — Assessment & Plan Note (Signed)
Using atarax prn  For agitation.. pallitive care witll like be able to help with this as well.

## 2020-07-26 NOTE — Assessment & Plan Note (Addendum)
Reviewed medical records in detail as pt unknown to me.  PCP has made referral for  palliative care... hospice referral may be more appropriate at this time. Family will discuss with Oncology at upcoming [place].     Considering termination of care.. has upcoming OV with Oncologist

## 2020-07-26 NOTE — Assessment & Plan Note (Signed)
Currently fluid overloaded. Encouraged caregivers to restart lasix at previous dose.

## 2020-08-27 ENCOUNTER — Ambulatory Visit: Payer: Medicare Other | Admitting: General Practice

## 2022-01-30 IMAGING — CT CT HEAD W/O CM
4 series · 16 of 47 positions shown, 18 images · non-contrast
Comparison: 12/07/2017

CLINICAL DATA: Un witnessed fall, left hip pain

EXAM:
CT HEAD WITHOUT CONTRAST
TECHNIQUE: Contiguous axial images were obtained from the base of the skull
through the vertex without intravenous contrast.

[Series 3: head wo · axial · 0.43mm/px · z∈[-187,-62]mm · 6 of 35 slices shown, 8 images]
[im 5/35  brain]
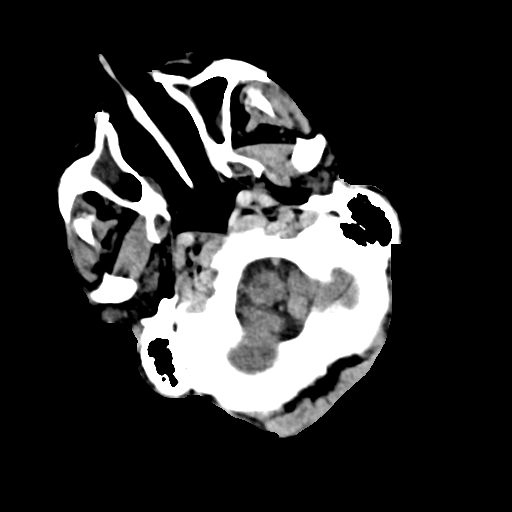
[im 5/35  bone]
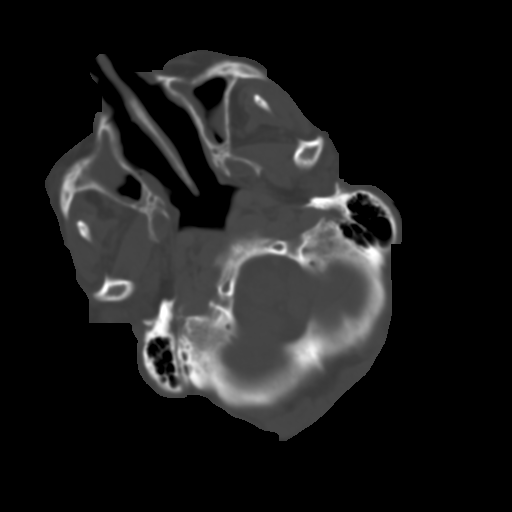
[im 10/35  brain]
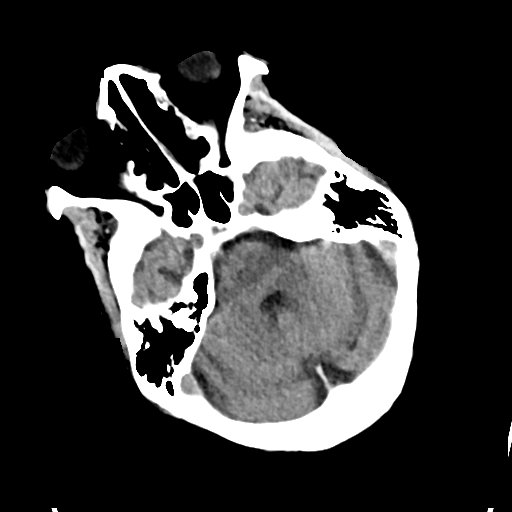
[im 15/35  brain]
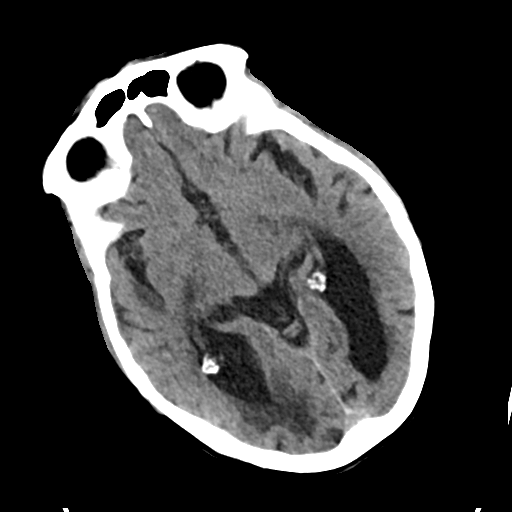
[im 20/35  brain]
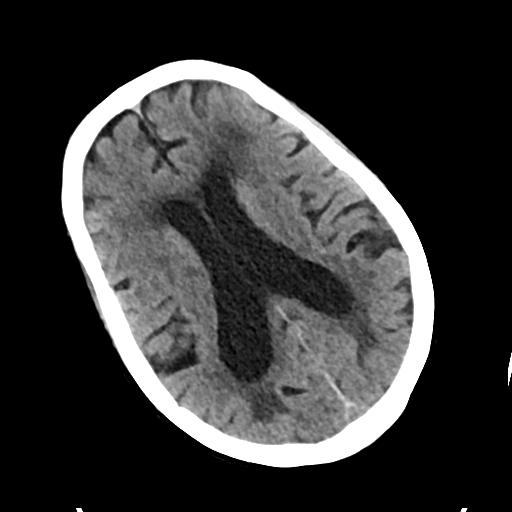
[im 25/35  brain]
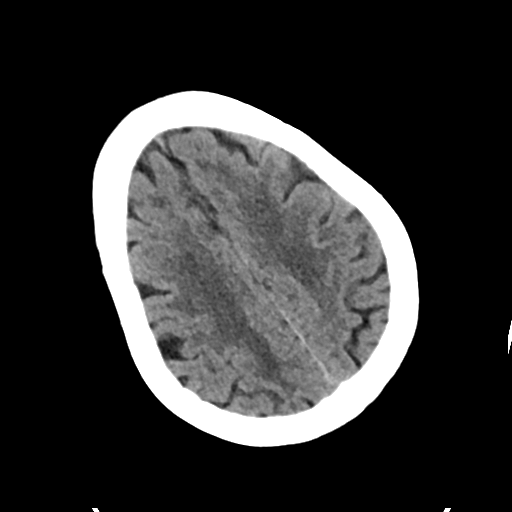
[im 25/35  bone]
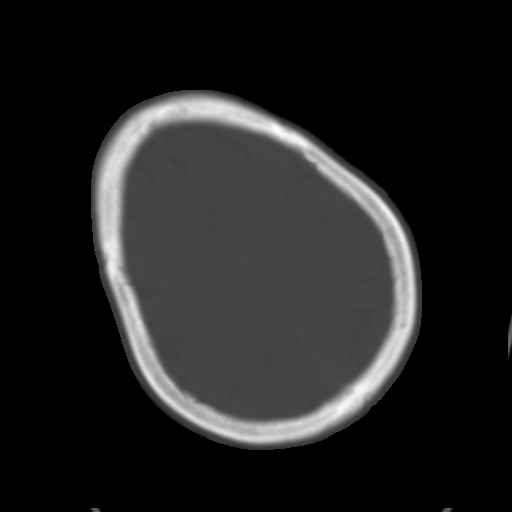
[im 30/35  brain]
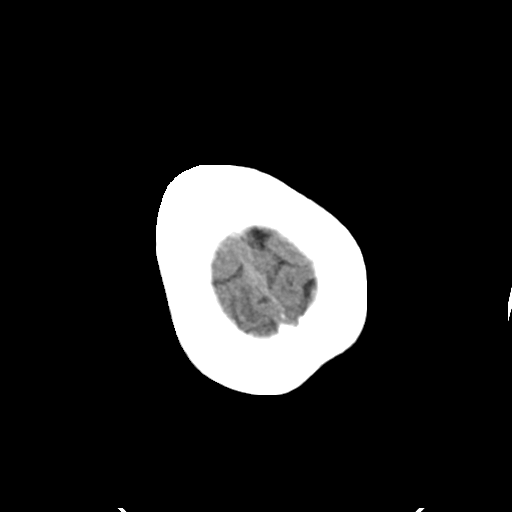

[Series 4: head bone · axial · 0.43mm/px · z∈[-191,-131]mm · 4 of 90 slices shown]
[im 9/90  bone]
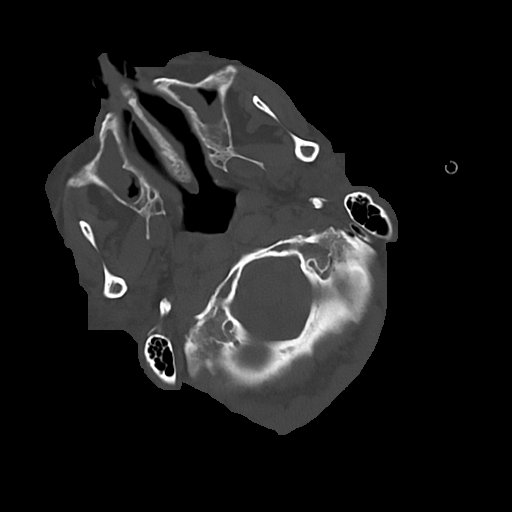
[im 17/90  bone]
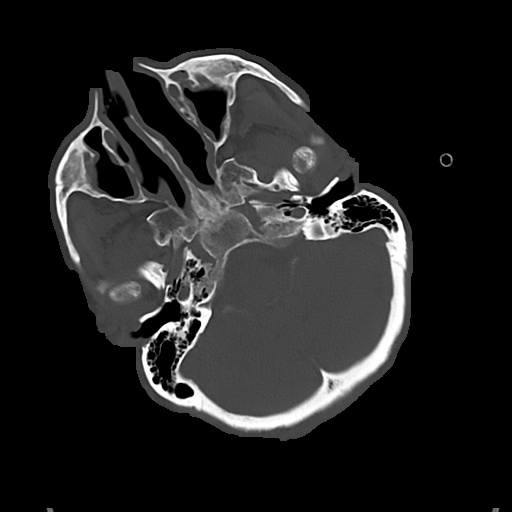
[im 30/90  bone]
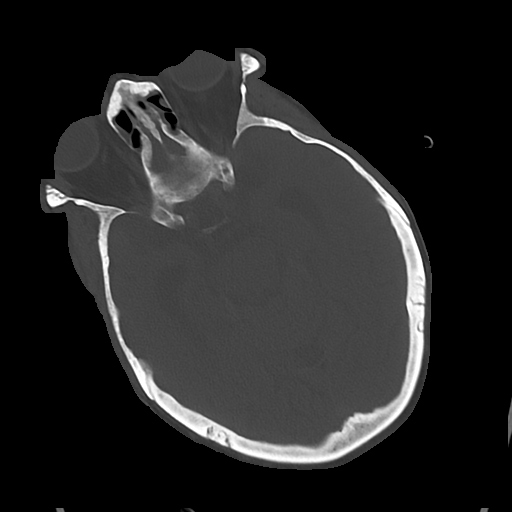
[im 39/90  bone]
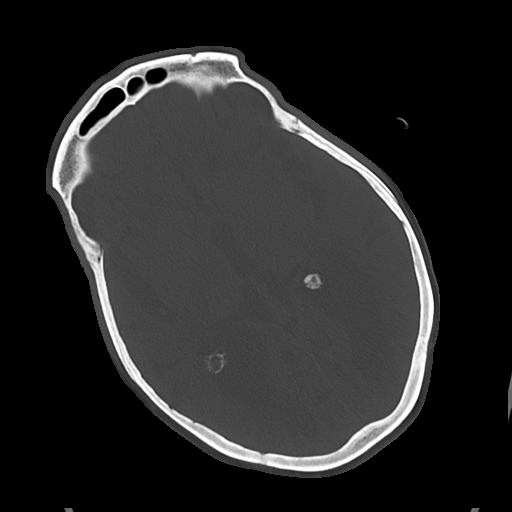

[Series 5: cor soft · coronal · 0.34mm/px · 3 of 73 slices shown]
[im 25/73  brain]
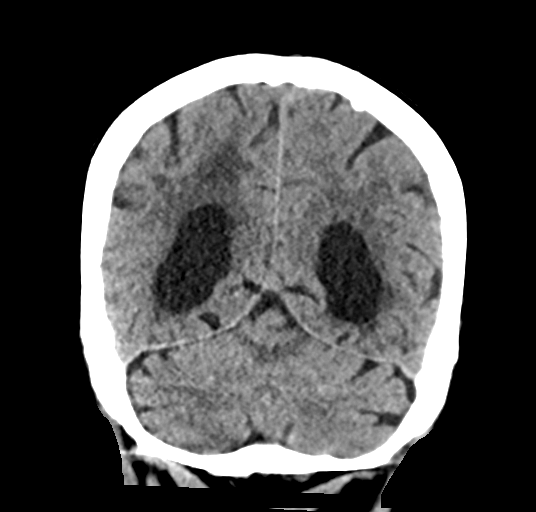
[im 33/73  brain]
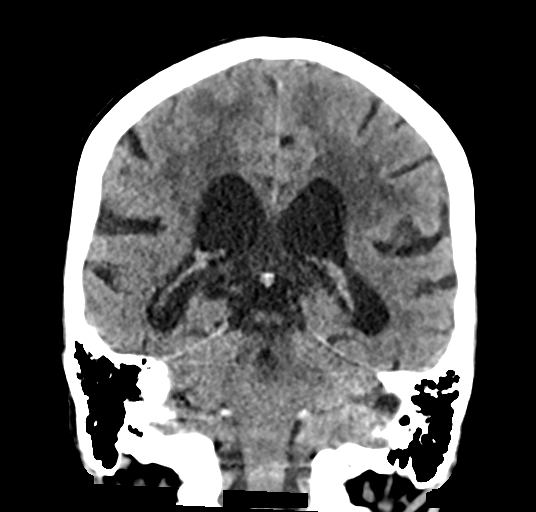
[im 41/73  brain]
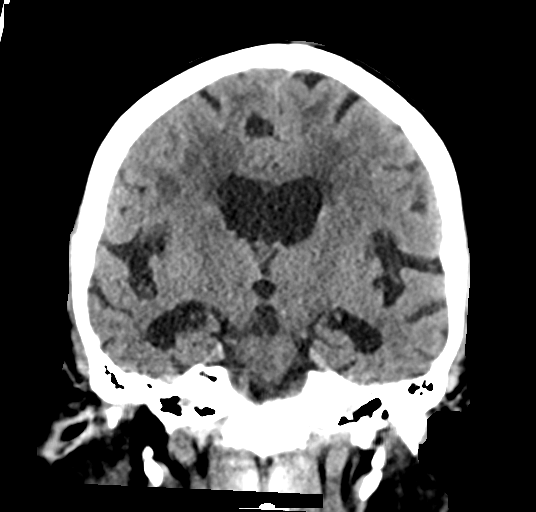

[Series 6: sag soft · sagittal · 0.34mm/px · 3 of 56 slices shown]
[im 19/56  brain]
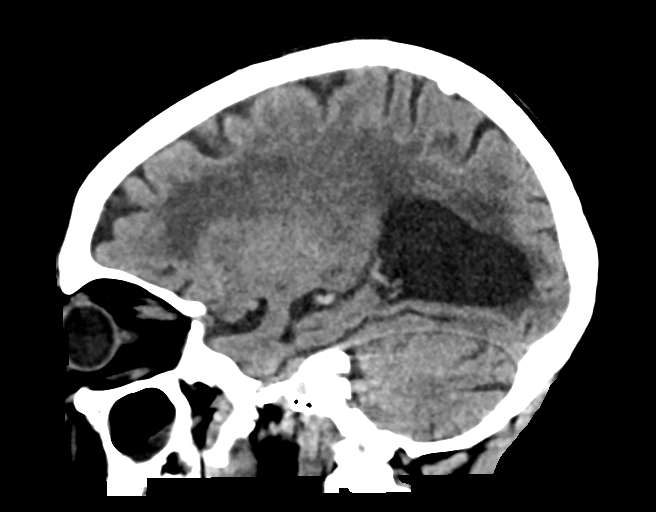
[im 28/56  brain]
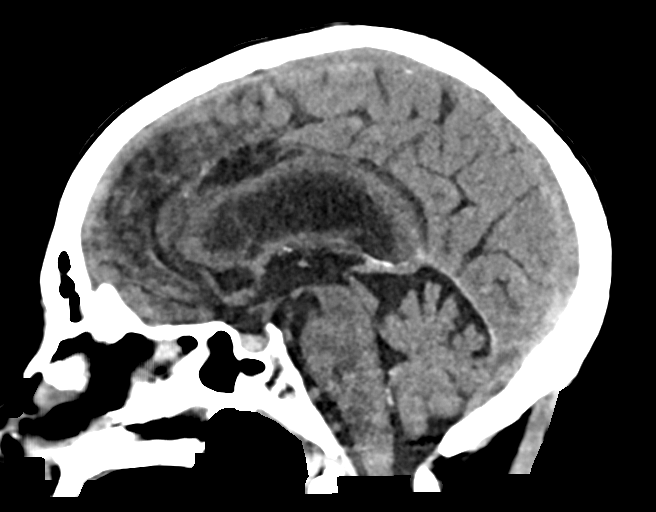
[im 37/56  brain]
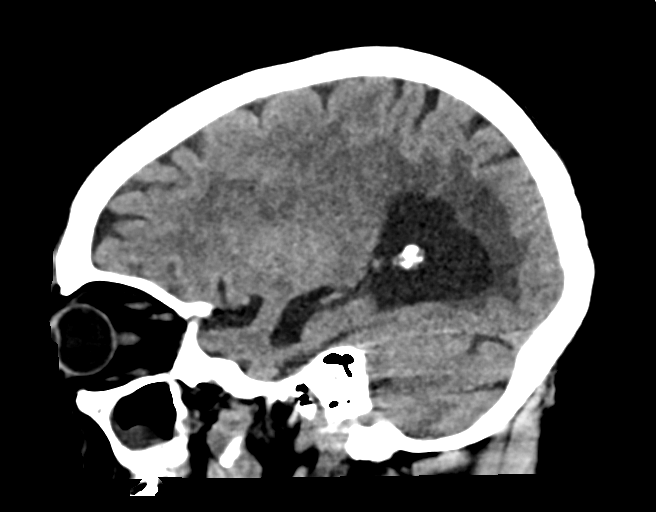

[16 of 47 positions shown; findings below may reference images not displayed]

FINDINGS: Brain: Extensive confluent hypodensities throughout the
periventricular and subcortical white matter are consistent with
chronic small vessel ischemic changes. This has progressed since the
previous MRI. No signs of acute infarct or hemorrhage. The lateral
ventricles and midline structures are unremarkable. No acute
extra-axial fluid collections. No mass effect.

Vascular: No hyperdense vessel or unexpected calcification.

Skull: Normal. Negative for fracture or focal lesion.

Sinuses/Orbits: Postsurgical changes from bilateral turbinectomies
and partial ethmoidectomy. There is mucosal thickening throughout
the paranasal sinuses, most pronounced within the maxillary sinuses.
No gas fluid levels.

Other: None.
IMPRESSION: 1. No acute intracranial process.
2. Extensive chronic small-vessel ischemic changes throughout the
periventricular white matter.
3. Pansinus disease.
